# Patient Record
Sex: Female | Born: 1975 | Race: Black or African American | Hispanic: No | Marital: Single | Smoking: Current every day smoker
Health system: Southern US, Community
[De-identification: ages and names within clinical notes are randomized; demographics above are authoritative.]

## PROBLEM LIST (undated history)

## (undated) ENCOUNTER — Ambulatory Visit (HOSPITAL_COMMUNITY): Admission: EM | Discharge status: Absent without leave | Payer: Medicaid Other

## (undated) DIAGNOSIS — F4310 Post-traumatic stress disorder, unspecified: Secondary | ICD-10-CM

## (undated) DIAGNOSIS — Z716 Tobacco abuse counseling: Secondary | ICD-10-CM

## (undated) DIAGNOSIS — D649 Anemia, unspecified: Secondary | ICD-10-CM

## (undated) DIAGNOSIS — I739 Peripheral vascular disease, unspecified: Secondary | ICD-10-CM

## (undated) DIAGNOSIS — F1010 Alcohol abuse, uncomplicated: Secondary | ICD-10-CM

## (undated) DIAGNOSIS — C801 Malignant (primary) neoplasm, unspecified: Secondary | ICD-10-CM

## (undated) DIAGNOSIS — F32A Depression, unspecified: Secondary | ICD-10-CM

## (undated) DIAGNOSIS — I2510 Atherosclerotic heart disease of native coronary artery without angina pectoris: Secondary | ICD-10-CM

## (undated) DIAGNOSIS — K859 Acute pancreatitis without necrosis or infection, unspecified: Secondary | ICD-10-CM

## (undated) DIAGNOSIS — I700 Atherosclerosis of aorta: Secondary | ICD-10-CM

## (undated) DIAGNOSIS — F419 Anxiety disorder, unspecified: Secondary | ICD-10-CM

## (undated) DIAGNOSIS — F319 Bipolar disorder, unspecified: Secondary | ICD-10-CM

## (undated) DIAGNOSIS — K7460 Unspecified cirrhosis of liver: Secondary | ICD-10-CM

## (undated) DIAGNOSIS — K219 Gastro-esophageal reflux disease without esophagitis: Secondary | ICD-10-CM

## (undated) DIAGNOSIS — I999 Unspecified disorder of circulatory system: Secondary | ICD-10-CM

## (undated) HISTORY — PX: COLONOSCOPY: SHX5424

## (undated) HISTORY — DX: Acute pancreatitis without necrosis or infection, unspecified: K85.90

## (undated) HISTORY — DX: Atherosclerosis of aorta: I70.0

## (undated) HISTORY — DX: Anxiety disorder, unspecified: F41.9

## (undated) HISTORY — DX: Tobacco abuse counseling: Z71.6

## (undated) HISTORY — DX: Unspecified cirrhosis of liver: K74.60

## (undated) HISTORY — PX: OTHER SURGICAL HISTORY: SHX169

## (undated) HISTORY — PX: FOOT SURGERY: SHX648

## (undated) HISTORY — PX: TUBAL LIGATION: SHX77

## (undated) HISTORY — DX: Gastro-esophageal reflux disease without esophagitis: K21.9

---

## 1998-08-20 ENCOUNTER — Emergency Department (HOSPITAL_COMMUNITY): Admission: EM | Admit: 1998-08-20 | Discharge: 1998-08-20 | Payer: Self-pay | Admitting: Emergency Medicine

## 1998-11-08 ENCOUNTER — Emergency Department (HOSPITAL_COMMUNITY): Admission: EM | Admit: 1998-11-08 | Discharge: 1998-11-08 | Payer: Self-pay | Admitting: Emergency Medicine

## 1999-06-24 ENCOUNTER — Emergency Department (HOSPITAL_COMMUNITY): Admission: EM | Admit: 1999-06-24 | Discharge: 1999-06-24 | Payer: Self-pay | Admitting: Emergency Medicine

## 2000-03-29 ENCOUNTER — Emergency Department (HOSPITAL_COMMUNITY): Admission: EM | Admit: 2000-03-29 | Discharge: 2000-03-29 | Payer: Self-pay | Admitting: Emergency Medicine

## 2000-06-04 ENCOUNTER — Emergency Department (HOSPITAL_COMMUNITY): Admission: EM | Admit: 2000-06-04 | Discharge: 2000-06-04 | Payer: Self-pay | Admitting: Emergency Medicine

## 2000-06-04 ENCOUNTER — Encounter: Payer: Self-pay | Admitting: Emergency Medicine

## 2001-02-05 ENCOUNTER — Emergency Department (HOSPITAL_COMMUNITY): Admission: EM | Admit: 2001-02-05 | Discharge: 2001-02-05 | Payer: Self-pay | Admitting: Emergency Medicine

## 2001-06-07 ENCOUNTER — Emergency Department (HOSPITAL_COMMUNITY): Admission: EM | Admit: 2001-06-07 | Discharge: 2001-06-07 | Payer: Self-pay | Admitting: Emergency Medicine

## 2001-12-10 ENCOUNTER — Emergency Department (HOSPITAL_COMMUNITY): Admission: EM | Admit: 2001-12-10 | Discharge: 2001-12-10 | Payer: Self-pay | Admitting: Emergency Medicine

## 2001-12-10 ENCOUNTER — Encounter: Payer: Self-pay | Admitting: Emergency Medicine

## 2002-04-19 ENCOUNTER — Inpatient Hospital Stay (HOSPITAL_COMMUNITY): Admission: AD | Admit: 2002-04-19 | Discharge: 2002-04-19 | Payer: Self-pay | Admitting: *Deleted

## 2002-07-23 ENCOUNTER — Inpatient Hospital Stay (HOSPITAL_COMMUNITY): Admission: AD | Admit: 2002-07-23 | Discharge: 2002-07-23 | Payer: Self-pay | Admitting: *Deleted

## 2002-08-13 HISTORY — PX: ABDOMINAL HYSTERECTOMY: SHX81

## 2002-08-20 ENCOUNTER — Observation Stay (HOSPITAL_COMMUNITY): Admission: AD | Admit: 2002-08-20 | Discharge: 2002-08-20 | Payer: Self-pay | Admitting: Obstetrics and Gynecology

## 2002-08-25 ENCOUNTER — Inpatient Hospital Stay (HOSPITAL_COMMUNITY): Admission: AD | Admit: 2002-08-25 | Discharge: 2002-08-27 | Payer: Self-pay | Admitting: *Deleted

## 2002-08-26 ENCOUNTER — Encounter (INDEPENDENT_AMBULATORY_CARE_PROVIDER_SITE_OTHER): Payer: Self-pay | Admitting: Specialist

## 2002-11-17 ENCOUNTER — Emergency Department (HOSPITAL_COMMUNITY): Admission: EM | Admit: 2002-11-17 | Discharge: 2002-11-17 | Payer: Self-pay | Admitting: Emergency Medicine

## 2003-05-24 ENCOUNTER — Emergency Department (HOSPITAL_COMMUNITY): Admission: EM | Admit: 2003-05-24 | Discharge: 2003-05-25 | Payer: Self-pay | Admitting: Emergency Medicine

## 2003-11-25 ENCOUNTER — Emergency Department (HOSPITAL_COMMUNITY): Admission: EM | Admit: 2003-11-25 | Discharge: 2003-11-25 | Payer: Self-pay | Admitting: Emergency Medicine

## 2004-10-08 ENCOUNTER — Emergency Department (HOSPITAL_COMMUNITY): Admission: EM | Admit: 2004-10-08 | Discharge: 2004-10-08 | Payer: Self-pay | Admitting: Emergency Medicine

## 2006-04-02 ENCOUNTER — Inpatient Hospital Stay (HOSPITAL_COMMUNITY): Admission: AD | Admit: 2006-04-02 | Discharge: 2006-04-02 | Payer: Self-pay | Admitting: Obstetrics and Gynecology

## 2007-07-11 ENCOUNTER — Emergency Department (HOSPITAL_COMMUNITY): Admission: EM | Admit: 2007-07-11 | Discharge: 2007-07-11 | Payer: Self-pay | Admitting: Emergency Medicine

## 2008-04-16 ENCOUNTER — Inpatient Hospital Stay (HOSPITAL_COMMUNITY): Admission: AD | Admit: 2008-04-16 | Discharge: 2008-04-16 | Payer: Self-pay | Admitting: Obstetrics & Gynecology

## 2008-06-02 ENCOUNTER — Ambulatory Visit (HOSPITAL_COMMUNITY): Admission: RE | Admit: 2008-06-02 | Discharge: 2008-06-02 | Payer: Self-pay | Admitting: Obstetrics & Gynecology

## 2008-12-09 ENCOUNTER — Inpatient Hospital Stay (HOSPITAL_COMMUNITY): Admission: AD | Admit: 2008-12-09 | Discharge: 2008-12-10 | Payer: Self-pay | Admitting: Obstetrics & Gynecology

## 2008-12-29 ENCOUNTER — Ambulatory Visit: Payer: Self-pay | Admitting: Obstetrics and Gynecology

## 2008-12-29 ENCOUNTER — Encounter: Payer: Self-pay | Admitting: Family

## 2008-12-30 ENCOUNTER — Encounter: Payer: Self-pay | Admitting: Family

## 2008-12-30 LAB — CONVERTED CEMR LAB: CA 125: 8.6 units/mL (ref 0.0–30.2)

## 2009-01-06 ENCOUNTER — Ambulatory Visit: Payer: Self-pay | Admitting: Obstetrics & Gynecology

## 2009-02-21 ENCOUNTER — Ambulatory Visit: Payer: Self-pay | Admitting: Obstetrics & Gynecology

## 2009-02-21 ENCOUNTER — Encounter: Payer: Self-pay | Admitting: Obstetrics & Gynecology

## 2009-02-21 ENCOUNTER — Ambulatory Visit (HOSPITAL_COMMUNITY): Admission: RE | Admit: 2009-02-21 | Discharge: 2009-02-21 | Payer: Self-pay | Admitting: Obstetrics & Gynecology

## 2010-04-05 ENCOUNTER — Emergency Department (HOSPITAL_COMMUNITY): Admission: EM | Admit: 2010-04-05 | Discharge: 2010-04-05 | Payer: Self-pay | Admitting: Emergency Medicine

## 2010-09-03 ENCOUNTER — Encounter: Payer: Self-pay | Admitting: *Deleted

## 2010-11-19 LAB — CBC
Hemoglobin: 14.3 g/dL (ref 12.0–15.0)
MCHC: 34.2 g/dL (ref 30.0–36.0)
MCV: 97.8 fL (ref 78.0–100.0)
RBC: 4.29 MIL/uL (ref 3.87–5.11)
WBC: 6.7 10*3/uL (ref 4.0–10.5)

## 2010-11-22 LAB — WET PREP, GENITAL: Yeast Wet Prep HPF POC: NONE SEEN

## 2010-11-22 LAB — CBC
HCT: 37.1 % (ref 36.0–46.0)
MCHC: 34.5 g/dL (ref 30.0–36.0)
MCV: 98.9 fL (ref 78.0–100.0)
RBC: 3.75 MIL/uL — ABNORMAL LOW (ref 3.87–5.11)

## 2010-11-22 LAB — GC/CHLAMYDIA PROBE AMP, GENITAL: GC Probe Amp, Genital: NEGATIVE

## 2010-12-26 NOTE — Group Therapy Note (Signed)
NAME:  Felicia Perkins, Felicia Perkins NO.:  [id number]   MEDICAL RECORD NO.:  [id number]          PATIENT TYPE:  WOC   LOCATION:  [facility]                   FACILITY:  [facility]   PHYSICIAN:  Johnella Moloney, MD        DATE OF BIRTH:  1975-09-16   DATE OF SERVICE:  12/29/2008                                  CLINIC NOTE   CHIEF COMPLAINT:  Followup for ovarian cyst diagnosed in the [facility]missions [facility] on December 09, 2008.   ALLERGIES:  No known drug allergies.   CURRENT MEDICATIONS:  None.   MENSTRUAL HISTORY:  Menarche 35 years old.  Reports regular cycles every  28 days, heavy to moderate flow.  Denies bleeding between periods.   OBSTETRICAL HISTORY:  Three normal vaginal deliveries.   CONTRACEPTIVE HISTORY:  Bilateral tubal ligation in 2001.   GYNECOLOGIC HISTORY:  Patient reports abnormal Pap smear 5 years ago,  unable to determine what type.  Reports having the mouth of the uterus  removed, received in [place].  Checked MSTAT for transcriptions and  none were available.   SURGICAL HISTORY:  Bilateral tubal ligation.   FAMILY HISTORY:  Mother with breast and cervical cancer, possible  ovarian, unsure.  Maternal breast cancer diagnosed at 68 years of age.  Sister with ovarian cancer diagnosed at 69 years of age.  Patient denies  family members doing BRCA testing.   PERSONAL MEDICAL HISTORY:  1. History of ulcer.  2. High blood pressure x1 year, no medication.  3. Dysplasia of the cervix.  4. Has positive hemorrhoids, occasional intermittent spotting of      blood.   SOCIAL HISTORY:  Three children.  Smokes approximately a half a pack x15  years.  No drinking.  No illicit drugs.  Denies physical or sexual abuse  or history of abuse.   REVIEW OF SYSTEMS:  The patient reports increased fatigue and weight  loss in the past 6 months as well as nausea and vomiting.  Pain with  intercourse x6 months.   EXAMINATION:  The patient is alert and oriented x3.  No signs  of acute  distress.  PELVIC EXAM:  Vagina:  No abnormal lesions.  No abnormal discharge.  Cervix is white, well demarcated area on the cervix totally encircling  the os.  No abnormal discharge from the cervix.  Uterus:  Small, mobile,  midline.  Adnexa:  Right tender with palpation.  No dominant masses.  Left nontender with palpation and slightly enlarged.   ASSESSMENT:  1. Ovarian cyst.  2. Family history of breast and cervical cancer.   PLAN:  1. CA-125 testing today.  2. Referred patient to the BRCA testing number:  [id number].      Discussed BRCA testing with patient and indication if positive.      Consulted with Dr. Silas Flood.  Have patient follow up next week for a      surgical consult.  Recommend removal of the ovaries.  Patient      agrees with plan.  3. A Pap smear obtained and sent to laboratory.  4. Follow up next week.  Sid Falcon, CNM    ______________________________  Johnella Moloney, MD    WM/MEDQ  D:  12/29/2008  T:  12/29/2008  Job:  [id number]

## 2010-12-26 NOTE — Op Note (Signed)
NAME:  Felicia Perkins, Felicia Perkins NO.:  [id number]   MEDICAL RECORD NO.:  [id number]          PATIENT TYPE:  AMB   LOCATION:  [facility]                           [facility]   PHYSICIAN:  Scheryl Darter, MD       DATE OF BIRTH:  [birth year]   DATE OF PROCEDURE:  02/21/2009  DATE OF DISCHARGE:  02/21/2009                               OPERATIVE REPORT   PROCEDURE:  Laparoscopy and bilateral salpingo-oophorectomy.   PREOPERATIVE DIAGNOSES:  1. Right cystic adnexal mass.  2. Family history of ovarian cancer.   POSTOPERATIVE DIAGNOSIS:  Bilateral benign ovarian cysts.   SURGEON:  Scheryl Darter, MD   ASSISTANT:  Javier Glazier. Rose, MD   ANESTHESIA:  General.   ESTIMATED BLOOD LOSS:  75 mL.   SPECIMENS:  Right and left fallopian tubes and ovaries.   COMPLICATIONS:  None.   DRAINS:  Foley catheter.   COUNTS:  Correct.   OPERATIVE COURSE:  The patient gave written consent for laparoscopy and  right salpingo-oophorectomy and possible left salpingo-oophorectomy.  Ultrasound showed a complex cystic right adnexal mass.  The patient has  a family history of ovarian cancer, and she requested removal of both  ovaries at the time of surgery.  The patient's identification was  confirmed.  She was brought to the operating room and general anesthesia  was induced.  She was placed in dorsal lithotomy position.  Exam  revealed no adnexal masses or tenderness.  Abdomen, perineum, and vagina  were sterilely prepped and draped.  Speculum was inserted, and Hulka  tenaculum was placed on the cervix.  A #11 blade was used to make a  transverse infraumbilical skin incision about 0.5 cm across.  Abdomen  was elevated, and Veress needle was placed, and CO2 was insufflated.  Trocar was placed, and a laparoscope was inserted with video camera in  use.  Panoramic view of the pelvis was obtained.  Uterus and tubes  looked normal.  Both ovaries were freely mobile and only appeared to  have functional  cysts.  Second punctures were placed in the right lower  quadrant and left lower quadrant with an 11-mm trocar placed in the  right lower quadrant.  The patient was placed in Trendelenburg position.  Right tube and ovary were elevated with grasper, and the EnSeal device  was used to coagulate and cut the right infundibulopelvic ligament after  the pelvis was inspected and the ureters were seen.  This was carried  along the broad ligament.  The utero-ovarian ligament and fallopian tube  were then likewise coagulated and transected and specimen was detached.  Likewise, the right tube and ovary were resected.  Hemostasis was  assured using the EnSeal device.  The specimens were removed using  EndoCatch pouch.  They were sent separately to Pathology.  The pelvis  was irrigated.  Good hemostasis was assured at the end of the case.  Estimated blood loss was 75 mL.  All instruments and CO2 was removed.  The fascia was closed with interrupted sutures of 0 Vicryl.  Skin was  closed with interrupted subcuticular sutures with  4-0 Vicryl.  Sterile  dressings were applied.  The patient tolerated the procedure well  without complications, and she was brought in stable condition to  recovery room.      Scheryl Darter, MD  Electronically Signed     JA/MEDQ  D:  02/21/2009  T:  02/22/2009  Job:  [id number]

## 2010-12-26 NOTE — Group Therapy Note (Signed)
NAME:  Felicia Perkins, Felicia Perkins NO.:  [id number]   MEDICAL RECORD NO.:  [id number]          PATIENT TYPE:  WOC   LOCATION:  [facility]                   [facility]   PHYSICIAN:  Scheryl Darter, MD       DATE OF BIRTH:  09/17/1975   DATE OF SERVICE:  01/06/2009                                  CLINIC NOTE   DICTATED BY:  Claudine Corbett.   The patient is a 35 year old African-American female with a family  history of ovarian cancer in her mother and sister as well as family  history of breast cancer in her mother who had this at close to age 15.  She presents for surgical consult for right ovarian complex cyst  diagnosed on her pelvic ultrasound dated December 10, 2008.  The cystic  lesion measures 3.5 cm and is oblong with internal septations.  She also  has a left 3.2 cm simple cyst present.  Overall the complex cystic  structure noted on the right ovary appears to be stable in appearance  compared to her previous pelvic ultrasound in October 2009.  The patient  does have associated abdominal cramping with her period. She also  complains of menorrhagia since September of 2009.  She denies any other  past surgical history other than bilateral tubal ligation in 2005.  She  has had 3 normal spontaneous vaginal deliveries.  Her last menstrual  period was December 03, 2008. She was also recently seen in clinic and had  a normal Pap smear on Dec 29, 2008.  She has not yet called the hotline  for BRCA testing.  For further past medical history please see dictated  note on Dec 29, 2008 by Dr. Silas Flood.   PHYSICAL EXAMINATION:  VITAL SIGNS:  Temperature 98.7, pulse 78, blood  pressure 136/96, weight 111.6 pounds.  Height is 5 feet 4 inches.  Respiratory rate 18.  GENERAL:  Thin, African-American female in no acute distress. Alert and  oriented.  LUNGS:  Clear to auscultation bilaterally.  CARDIOVASCULAR:  Regular rate and rhythm.  HEENT:  No thyromegaly.  NECK: Supple.  Mucous  membranes are moist.  No conjunctival pallor.  ABDOMEN: Soft and nontender, nondistended. No palpable masses.  EXTREMITIES:  No peripheral edema.  No clubbing or cyanosis.  PSYCHIATRIC: Normal mood.  Normal affect.   ASSESSMENT AND PLAN:  A 35 year old African-American female with family  history of ovarian cancer in her mother and sister as well as breast  cancer in her mother with a complex right ovarian cystic structure on  the right ovary measuring 2.5 cm.  We strongly recommended that the  patient go ahead and call the BRCA hotline to have this testing done as  this will further direct her management.  We will have her scheduled for  a laparoscopic assisted right salpingo-oophorectomy with possible left  salpingo-oophorectomy pending her BRCA status. The risks and  benefits of the surgery were discuss and reviewed with the patient.  The  patient did provide informed consent for the surgery.  We will contact  her with scheduling information.      Scheryl Darter, MD  ______________________________  Scheryl Darter, MD    JA/MEDQ  D:  01/06/2009  T:  01/06/2009  Job:  [id number]

## 2010-12-29 NOTE — Op Note (Signed)
   NAME:  Felicia Perkins, Felicia Perkins                       ACCOUNT NO.:  [id number]   MEDICAL RECORD NO.:  [id number]                   PATIENT TYPE:  INP   LOCATION:  9136                                 [facility]   PHYSICIAN:  Phil D. Okey Dupre, M.D.                  DATE OF BIRTH:  Dec 21, 1975   DATE OF PROCEDURE:  08/26/2002  DATE OF DISCHARGE:  08/27/2002                                 OPERATIVE REPORT   PREOPERATIVE DIAGNOSIS:  Voluntary sterilization.   POSTOPERATIVE DIAGNOSIS:  Voluntary sterilization.   PROCEDURE:  Bilateral tubal ligation, postpartum.   SURGEON:  Javier Glazier. Okey Dupre, M.D.   ANESTHESIA:  Epidural with IV sedation.   DESCRIPTION OF PROCEDURE:  Under satisfactory epidural with supplemental IV  sedation, the patient in the dorsal supine position, a Foley catheter in the  urinary bladder, the abdomen was prepped and draped in the usual sterile  manner and entered through a transverse subumbilical incision extending  situated less than a centimeter below the umbilicus and extending for a  total length of 4 cm.  The abdomen was opened on layers.  On entering the  peritoneal cavity, the right fallopian tube was grasped in the midportion  and followed down to the fimbriae to make sure that we were in the tube and  then in the midportion, the tie was placed on the distal and proximal end of  the tube by placing a suture through the meso beneath the tube in an  avascular area and tying around the distal and proximal end, forming a loop  of 2 cm above the tie.  A second tie was placed above the aforementioned  with 1 plain catgut suture, and the section of the tube above the ties  excised.  The ends of the tube held by the ties was coagulated with hot  cautery and the tube replaced into the peritoneal cavity.  The same  procedure was carried out with regard to the left fallopian tube.  The area  was observed for bleeding, none was noted, and the fascia was closed with  continuous  running 0 Vicryl on an atraumatic needle, which was continued  through a subcuticular closure.  A dry sterile dressing was applied.  The  patient tolerated the procedure well and was transferred to the recovery  room in satisfactory condition.                                                Phil D. Okey Dupre, M.D.    PDR/MEDQ  D:  10/07/2002  T:  10/08/2002  Job:  [id number]

## 2010-12-29 NOTE — Op Note (Signed)
   NAME:  Felicia Perkins, Felicia Perkins                       ACCOUNT NO.:  [id number]   MEDICAL RECORD NO.:  [id number]                   PATIENT TYPE:  INP   LOCATION:  9136                                 [facility]   PHYSICIAN:  Phil D. Okey Dupre, M.D.                  DATE OF BIRTH:  09-09-1975   DATE OF PROCEDURE:  08/26/2002  DATE OF DISCHARGE:                                 OPERATIVE REPORT   PROCEDURE:  Bilateral tubal ligation.   PREOPERATIVE DIAGNOSES:  1. Multiparity.  2. Voluntary sterilization.   POSTOPERATIVE DIAGNOSES:  1. Multiparity.  2. Voluntary sterilization.   SURGEON:  Javier Glazier. Okey Dupre, M.D.   SURGICAL REMOVAL:  2 cm of each fallopian tube.   OPERATIVE FINDINGS:  Two normal fallopian tubes.   PROCEDURE:  Under satisfactory epidural anesthesia with IV sedation the  patient in a dorsal supine position and the abdomen prepped and draped in  the usual sterile manner.  Entered through a 5 cm transverse incision  situated just below the umbilicus about 1 cm.  The abdomen was opened by  layers.  On entering the peritoneal cavity the right fallopian tube was  grasped in the mid portion and followed down to the fimbria.  Opening made  in an avascular portion of the meso beneath the tube and one plain suture  drawn through this and tied around the distal and proximal end of the tube  to form a loop of approximately 2 cm above the tie.  This tie was clamped  with a hemostat, the second tie placed just below the first, a free tie of 1  plain.  The section of tube above the ties was then excised and the tips of  the tubes coagulated with hot cautery.  The area was observed for bleeding.  None was noted.  The tube was placed back into the peritoneal cavity.  The  same was carried out with regard to the left fallopian tube and the  peritoneum and fascia were included in the same stitch and run with a 0  Vicryl suture which was continued to a subcuticular closure.  Dry, sterile  dressing was applied.  The patient was transferred to recovery room in  satisfactory condition after tolerating the procedure well.                                               Phil D. Okey Dupre, M.D.    PDR/MEDQ  D:  08/26/2002  T:  08/26/2002  Job:  [id number]

## 2011-05-16 LAB — URINE MICROSCOPIC-ADD ON

## 2011-05-16 LAB — URINALYSIS, ROUTINE W REFLEX MICROSCOPIC
Leukocytes, UA: NEGATIVE
Nitrite: NEGATIVE
Protein, ur: 30 — AB
Specific Gravity, Urine: >1.03 — ABNORMAL HIGH
Urobilinogen, UA: 0.2

## 2011-05-16 LAB — WET PREP, GENITAL: Clue Cells Wet Prep HPF POC: NONE SEEN

## 2011-05-16 LAB — CBC
HCT: 39.3
Hemoglobin: 13.4
RBC: 3.71 — ABNORMAL LOW
WBC: 4.7

## 2011-08-14 ENCOUNTER — Encounter: Payer: Self-pay | Admitting: *Deleted

## 2011-08-14 ENCOUNTER — Emergency Department (HOSPITAL_COMMUNITY)
Admission: EM | Admit: 2011-08-14 | Discharge: 2011-08-14 | Disposition: A | Discharge status: Home / Self Care | Payer: Medicaid Other | Attending: Emergency Medicine | Admitting: Emergency Medicine

## 2011-08-14 ENCOUNTER — Emergency Department (HOSPITAL_COMMUNITY): Payer: Medicaid Other

## 2011-08-14 DIAGNOSIS — R1084 Generalized abdominal pain: Secondary | ICD-10-CM | POA: Insufficient documentation

## 2011-08-14 DIAGNOSIS — K644 Residual hemorrhoidal skin tags: Secondary | ICD-10-CM | POA: Insufficient documentation

## 2011-08-14 DIAGNOSIS — R7989 Other specified abnormal findings of blood chemistry: Secondary | ICD-10-CM | POA: Insufficient documentation

## 2011-08-14 DIAGNOSIS — K921 Melena: Secondary | ICD-10-CM | POA: Insufficient documentation

## 2011-08-14 DIAGNOSIS — K625 Hemorrhage of anus and rectum: Secondary | ICD-10-CM | POA: Insufficient documentation

## 2011-08-14 DIAGNOSIS — R110 Nausea: Secondary | ICD-10-CM | POA: Insufficient documentation

## 2011-08-14 DIAGNOSIS — R634 Abnormal weight loss: Secondary | ICD-10-CM | POA: Insufficient documentation

## 2011-08-14 DIAGNOSIS — R630 Anorexia: Secondary | ICD-10-CM | POA: Insufficient documentation

## 2011-08-14 LAB — DIFFERENTIAL
Basophils Absolute: 0.1 10*3/uL (ref 0.0–0.1)
Basophils Relative: 1 % (ref 0–1)
Monocytes Absolute: 0.5 10*3/uL (ref 0.1–1.0)
Neutro Abs: 2 10*3/uL (ref 1.7–7.7)

## 2011-08-14 LAB — POCT I-STAT, CHEM 8
Calcium, Ion: 1.19 mmol/L (ref 1.12–1.32)
Chloride: 105 mEq/L (ref 96–112)
HCT: 44 % (ref 36.0–46.0)
Hemoglobin: 15 g/dL (ref 12.0–15.0)
TCO2: 23 mmol/L (ref 0–100)

## 2011-08-14 LAB — URINALYSIS, ROUTINE W REFLEX MICROSCOPIC
Bilirubin Urine: NEGATIVE
Hgb urine dipstick: NEGATIVE
Specific Gravity, Urine: 1.016 (ref 1.005–1.030)
Urobilinogen, UA: 0.2 mg/dL (ref 0.0–1.0)
pH: 5 (ref 5.0–8.0)

## 2011-08-14 LAB — HEPATIC FUNCTION PANEL
ALT: 54 U/L — ABNORMAL HIGH (ref 0–35)
Indirect Bilirubin: 0.5 mg/dL (ref 0.3–0.9)
Total Protein: 7.9 g/dL (ref 6.0–8.3)

## 2011-08-14 LAB — CBC
Hemoglobin: 14.5 g/dL (ref 12.0–15.0)
MCH: 35.6 pg — ABNORMAL HIGH (ref 26.0–34.0)
MCV: 100 fL (ref 78.0–100.0)
Platelets: 222 10*3/uL (ref 150–400)
RBC: 4.07 MIL/uL (ref 3.87–5.11)
WBC: 6 10*3/uL (ref 4.0–10.5)

## 2011-08-14 MED ORDER — MORPHINE SULFATE 4 MG/ML IJ SOLN
4.0000 mg | Freq: Once | INTRAMUSCULAR | Status: AC
  Administered 2011-08-14: 4 mg via INTRAVENOUS
  Filled 2011-08-14: qty 1

## 2011-08-14 MED ORDER — SODIUM CHLORIDE 0.9 % IV SOLN
Freq: Once | INTRAVENOUS | Status: AC
  Administered 2011-08-14 (×2): via INTRAVENOUS

## 2011-08-14 MED ORDER — ONDANSETRON HCL 4 MG/2ML IJ SOLN
4.0000 mg | Freq: Once | INTRAMUSCULAR | Status: AC
  Administered 2011-08-14: 4 mg via INTRAVENOUS
  Filled 2011-08-14: qty 2

## 2011-08-14 NOTE — ED Notes (Signed)
Pt is here with pain below belly button for one month.  Pt sts rectal bleeding that started 2 months ago and now it has increased.  Pt reports severe stomach cramps and increased bowel movements per day.  Pt denies diarrhea

## 2011-08-14 NOTE — ED Provider Notes (Signed)
History     CSN: [id number]  Arrival date & time 08/14/11  0909   First MD Initiated Contact with Patient 08/14/11 [phone]      Chief Complaint  Patient presents with  . Abdominal Pain    (Consider location/radiation/quality/duration/timing/severity/associated sxs/prior treatment) Patient is a 36 y.o. female presenting with abdominal pain. The history is provided by the patient.  Abdominal Pain The primary symptoms of the illness include abdominal pain and nausea. The primary symptoms of the illness do not include fever, shortness of breath, vomiting, diarrhea, dysuria, vaginal discharge or vaginal bleeding.  Symptoms associated with the illness do not include chills or constipation.   patient presents with several months worth of abdominal pain, rectal bleeding, and decreased appetite. The pain seems to be generalized throughout the abdomen and is described as crampy in nature. There are no known aggravating or alleviating factors. She is taking an over-the-counter ibuprofen and Tylenol which have not helped. She has had associated nausea.  She also states that she has had increased bowel movements per day noting up to 6 per day at times. She is also noted bright red blood in her stool. States that she initially just noticed blood spotting but the toilet paper, but now notices blood coating the stool and blood in the toilet bowl. She states that she had hemorrhoids with her pregnancy, but has not had any trouble consents. She did not have to go to any procedures to get these repaired. She denies hard stool or diarrhea.  Finally, she has noticed a loss of appetite over the past 2 months or so. She states that she sometimes has to force herself to eat. She has lost about 10 pounds over this time. She does have a family history of colon cancer, but is unsure when her family members were diagnosed. She's never been seen by GI or had a colonoscopy. She has no known history of Crohn's or ulcerative  colitis.  States she has been using Tylenol frequently recently, and admits to frequent use of Goody's powders prior to using Tylenol.   History reviewed. No pertinent past medical history.  Past Surgical History  Procedure Date  . Other surgical history     ovarian surgery    No family history on file.  History  Substance Use Topics  . Smoking status: Current Everyday Smoker  . Smokeless tobacco: Not on file  . Alcohol Use: Yes     occ    OB History    Grav Para Term Preterm Abortions TAB SAB Ect Mult Living                  Review of Systems  Constitutional: Positive for unexpected weight change. Negative for fever, chills, activity change and appetite change.  HENT: Negative.   Respiratory: Negative for chest tightness and shortness of breath.   Cardiovascular: Negative for chest pain and palpitations.  Gastrointestinal: Positive for nausea, abdominal pain, blood in stool and anal bleeding. Negative for vomiting, diarrhea, constipation, abdominal distention and rectal pain.  Genitourinary: Negative for dysuria, vaginal bleeding and vaginal discharge.  Musculoskeletal: Negative.   Skin: Negative.   Neurological: Negative for dizziness and weakness.    Allergies  Review of patient's allergies indicates no known allergies.  Home Medications   Current Outpatient Rx  Name Route Sig Dispense Refill  . CITALOPRAM HYDROBROMIDE 20 MG PO TABS Oral Take 20 mg by mouth daily.      . TRAZODONE HCL 50 MG PO TABS Oral  Take 100 mg by mouth at bedtime.        BP 126/92  Pulse 99  Temp(Src) 98.6 F (37 C) (Oral)  Resp 18  SpO2 99%  Physical Exam  Nursing note and vitals reviewed. Constitutional: She appears well-developed and well-nourished.  Non-toxic appearance. No distress.  HENT:  Head: Normocephalic and atraumatic.  Eyes: Conjunctivae are normal. Right eye exhibits no discharge. Left eye exhibits no discharge.  Neck: Normal range of motion.  Cardiovascular:  Normal rate, regular rhythm and normal heart sounds.   Pulmonary/Chest: Effort normal and breath sounds normal.  Abdominal: Soft. Normal appearance and bowel sounds are normal. She exhibits no distension, no ascites and no mass. There is no hepatosplenomegaly. There is tenderness in the right upper quadrant and epigastric area. There is guarding. There is no rebound and negative Murphy's sign.  Genitourinary: Rectal exam shows external hemorrhoid and anal tone abnormal. Rectal exam shows no internal hemorrhoid, no fissure, no mass and no tenderness. Guaiac positive stool.       2 ext hemorrhoids noted  Musculoskeletal: Normal range of motion. She exhibits no edema.  Neurological: She is alert.  Skin: Skin is warm and dry. She is not diaphoretic.  Psychiatric: She has a normal mood and affect.    ED Course  Procedures (including critical care time)  Labs Reviewed  URINALYSIS, ROUTINE W REFLEX MICROSCOPIC - Abnormal; Notable for the following:    APPearance HAZY (*)    All other components within normal limits  CBC - Abnormal; Notable for the following:    MCH 35.6 (*)    All other components within normal limits  DIFFERENTIAL - Abnormal; Notable for the following:    Neutrophils Relative 33 (*)    Lymphocytes Relative 54 (*)    All other components within normal limits  POCT I-STAT, CHEM 8 - Abnormal; Notable for the following:    BUN <3 (*)    All other components within normal limits  HEPATIC FUNCTION PANEL - Abnormal; Notable for the following:    AST 80 (*)    ALT 54 (*)    All other components within normal limits  LIPASE, BLOOD  I-STAT, CHEM 8  POCT OCCULT BLOOD STOOL, DEVICE  Hemoccult positive  US Abdomen Complete  08/14/2011  *RADIOLOGY REPORT*  Clinical Data:  Right upper quadrant pain and elevated liver function tests.  COMPLETE ABDOMINAL ULTRASOUND  Comparison:  None.  Findings:  Gallbladder:  Normal, without wall thickening, stone, or pericholecystic fluid.   Sonographic Murphy's sign was not elicited.  Common bile duct: Normal, 4 mm.  Liver: Normal in echogenicity, without focal lesion.  IVC: Negative  Pancreas:  Negative  Spleen:  Normal in size and echogenicity.  Right Kidney:  10.7 cm. No hydronephrosis.  Left Kidney:  10.2 cm. No hydronephrosis.  Abdominal aorta:  Nonaneurysmal without ascites.  IMPRESSION: Normal abdominal ultrasound.  No explanation for patient's symptoms.  Original Report Authenticated By: Consuello Bossier, M.D.     1. Elevated LFTs   2. Abdominal pain   3. Rectal bleeding       MDM  The patient's LFTs were elevated. An ultrasound of the abdomen was ordered, which was negative for gallbladder pathology. Patient admits that she has been taking Tylenol frequently lately, along with Goody's powder. Suspect this may be what is causing her symptoms. She was advised to discontinue the use of these substances. Recommended that she followup with [facility] to have repeat LFTs drawn and to establish PCP  f/u. She was noted to have external hemorrhoids on exam; this may be causing her rectal bleeding. Do not have suspicion for acute abdomen at this time based on presentation. Discussed return precautions. She verbalized understanding and agreed to plan.       Grant Fontana, [place] 08/14/11 [phone]

## 2011-08-14 NOTE — ED Notes (Signed)
Stroke log info;  Stroke encoded at 0955, called at 0959 by [facility], patient arrived 42. Edp exam at 1008, stroke team arrival 31, lsn 0715, pt arrival in ct 10:12, phlebotomy arrival 1005,   Ct read by neuro at 10:15

## 2011-08-14 NOTE — ED Notes (Signed)
Patient transported to Ultrasound 

## 2011-08-14 NOTE — ED Notes (Signed)
Pt reports chronic use of BC powders for pain

## 2011-08-14 NOTE — ED Notes (Signed)
Still waiting for ultrasound tech to come from [place] and do her [place].

## 2011-08-14 NOTE — ED Notes (Signed)
Pt stated that she was still having abdominal pain. Pt's bed adjusted for comfort and additional blankets given. Second bag of fluid changed and pain medications given. Ultrasound is still pending, tech is currently over at [facility] long and will be over here to complete pt's exam asap. Pt made aware of plan of care and what we are currently waiting on. Iv site clear for infiltrate. Pt resting comfortably.

## 2011-08-16 NOTE — ED Provider Notes (Signed)
History/physical exam/procedure(s) were performed by non-physician practitioner and as supervising physician I was immediately available for consultation/collaboration. I have reviewed all notes and am in agreement with care and plan.   Daryana Whirley S Jeriel Vivanco, MD 08/16/11 1241 

## 2011-09-02 ENCOUNTER — Emergency Department (HOSPITAL_COMMUNITY)
Admission: EM | Admit: 2011-09-02 | Discharge: 2011-09-02 | Disposition: A | Discharge status: Home / Self Care | Payer: Medicaid Other | Attending: Emergency Medicine | Admitting: Emergency Medicine

## 2011-09-02 ENCOUNTER — Encounter (HOSPITAL_COMMUNITY): Payer: Self-pay

## 2011-09-02 DIAGNOSIS — K089 Disorder of teeth and supporting structures, unspecified: Secondary | ICD-10-CM | POA: Insufficient documentation

## 2011-09-02 DIAGNOSIS — K0889 Other specified disorders of teeth and supporting structures: Secondary | ICD-10-CM

## 2011-09-02 DIAGNOSIS — K029 Dental caries, unspecified: Secondary | ICD-10-CM | POA: Insufficient documentation

## 2011-09-02 MED ORDER — OXYCODONE-ACETAMINOPHEN 5-325 MG PO TABS
2.0000 | ORAL_TABLET | Freq: Once | ORAL | Status: AC
  Administered 2011-09-02: 2 via ORAL
  Filled 2011-09-02: qty 2

## 2011-09-02 MED ORDER — OXYCODONE-ACETAMINOPHEN 5-325 MG PO TABS: 2.0000 | ORAL_TABLET | ORAL | Status: AC | PRN

## 2011-09-02 MED ORDER — PENICILLIN V POTASSIUM 250 MG PO TABS
500.0000 mg | ORAL_TABLET | Freq: Once | ORAL | Status: AC
  Administered 2011-09-02: 500 mg via ORAL
  Filled 2011-09-02: qty 2

## 2011-09-02 MED ORDER — PENICILLIN V POTASSIUM 500 MG PO TABS: 500.0000 mg | ORAL_TABLET | Freq: Four times a day (QID) | ORAL | Status: AC

## 2011-09-02 NOTE — ED Provider Notes (Signed)
History     CSN: [id number]  Arrival date & time 09/02/11  7673   First MD Initiated Contact with Patient 09/02/11 [phone]      Chief Complaint  Patient presents with  . Dental Pain    (Consider location/radiation/quality/duration/timing/severity/associated sxs/prior treatment) HPI Comments: R lower molar pain x 2 weeks. No difficulty breathing or swallowing.  No fever, vomiting.  No drooling.  Poor dentition throughout.  Taking tylenol and orajel without relief.  Pain radiates down R neck.  Unable to see dentist because of medicaid problem.  The history is provided by the patient.    History reviewed. No pertinent past medical history.  Past Surgical History  Procedure Date  . Other surgical history     ovarian surgery    No family history on file.  History  Substance Use Topics  . Smoking status: Current Everyday Smoker  . Smokeless tobacco: Not on file  . Alcohol Use: No     occ    OB History    Grav Para Term Preterm Abortions TAB SAB Ect Mult Living                  Review of Systems  Constitutional: Negative for activity change and appetite change.  HENT: Positive for dental problem. Negative for drooling.   Respiratory: Negative for shortness of breath.   Cardiovascular: Negative for chest pain.  Gastrointestinal: Negative for nausea, vomiting and abdominal pain.  Genitourinary: Negative for dysuria.  Musculoskeletal: Negative for back pain.  Skin: Negative for rash.  Neurological: Negative for headaches.    Allergies  Review of patient's allergies indicates no known allergies.  Home Medications   Current Outpatient Rx  Name Route Sig Dispense Refill  . BENZOCAINE 20 % MT GEL Mouth/Throat Use as directed 1 application in the mouth or throat 4 (four) times daily as needed.    Marland Kitchen CITALOPRAM HYDROBROMIDE 20 MG PO TABS Oral Take 20 mg by mouth daily.      Marland Kitchen DIPHENHYDRAMINE-APAP (SLEEP) 25-500 MG PO TABS Oral Take 2 tablets by mouth at bedtime as needed.       BP 128/88  Pulse 84  Temp(Src) 97.6 F (36.4 C) (Oral)  Resp 16  SpO2 97%  Physical Exam  Constitutional: She is oriented to person, place, and time.  HENT:  Head: Normocephalic and atraumatic.  Mouth/Throat: Oropharynx is clear and moist. No oropharyngeal exudate.       R lower molar TTP to palpation with caries.  No abscess.  Multiple carious teeth.  Floor of mouth soft.  No trismus or tongue elevation  Eyes: Conjunctivae are normal. Pupils are equal, round, and reactive to light.  Neck: Normal range of motion. Neck supple.       No meningismus  Cardiovascular: Normal rate, regular rhythm and normal heart sounds.   Pulmonary/Chest: Effort normal and breath sounds normal. No respiratory distress.  Abdominal: Soft.  Musculoskeletal: Normal range of motion. She exhibits no edema and no tenderness.  Neurological: She is alert and oriented to person, place, and time. No cranial nerve deficit.  Skin: Skin is warm.    ED Course  Procedures (including critical care time)  Labs Reviewed - No data to display No results found.   No diagnosis found.    MDM  Dental pain.  No evidence of abscess.  No airway involvement, no systemic symptoms.  Pain meds, PCN, dental f/u        Glynn Octave, MD 09/02/11 [phone]

## 2011-09-02 NOTE — ED Notes (Signed)
MD at bedside. 

## 2011-09-02 NOTE — ED Notes (Signed)
Pt has left lower tooth pain x 2 weeks.  Was told by dentist that he can see her until her medicaid "comes through"

## 2012-02-24 ENCOUNTER — Encounter (HOSPITAL_COMMUNITY): Payer: Self-pay | Admitting: Emergency Medicine

## 2012-02-24 ENCOUNTER — Emergency Department (HOSPITAL_COMMUNITY)
Admission: EM | Admit: 2012-02-24 | Discharge: 2012-02-24 | Disposition: A | Discharge status: Home / Self Care | Payer: Medicaid Other | Attending: Emergency Medicine | Admitting: Emergency Medicine

## 2012-02-24 DIAGNOSIS — R42 Dizziness and giddiness: Secondary | ICD-10-CM | POA: Insufficient documentation

## 2012-02-24 DIAGNOSIS — R209 Unspecified disturbances of skin sensation: Secondary | ICD-10-CM | POA: Insufficient documentation

## 2012-02-24 HISTORY — DX: Post-traumatic stress disorder, unspecified: F43.10

## 2012-02-24 NOTE — ED Notes (Signed)
Pt to the desk, reports feeling so much better, was out in the heat and now in the cool air she feels normal, spouse coming to get her

## 2012-02-24 NOTE — ED Notes (Signed)
Pt. Stated, i was at Sunday dinner and I sit down for about 10 min and I got real dizzy and on the way home I got real shaky and feeling tingling and all I felt like doing was laying down.

## 2012-08-25 ENCOUNTER — Emergency Department (HOSPITAL_COMMUNITY): Payer: Medicaid Other

## 2012-08-25 ENCOUNTER — Encounter (HOSPITAL_COMMUNITY): Payer: Self-pay | Admitting: *Deleted

## 2012-08-25 ENCOUNTER — Emergency Department (HOSPITAL_COMMUNITY)
Admission: EM | Admit: 2012-08-25 | Discharge: 2012-08-25 | Disposition: A | Discharge status: Home / Self Care | Payer: Medicaid Other | Attending: Emergency Medicine | Admitting: Emergency Medicine

## 2012-08-25 DIAGNOSIS — H5710 Ocular pain, unspecified eye: Secondary | ICD-10-CM | POA: Insufficient documentation

## 2012-08-25 DIAGNOSIS — H53149 Visual discomfort, unspecified: Secondary | ICD-10-CM | POA: Insufficient documentation

## 2012-08-25 DIAGNOSIS — J3489 Other specified disorders of nose and nasal sinuses: Secondary | ICD-10-CM | POA: Insufficient documentation

## 2012-08-25 DIAGNOSIS — J329 Chronic sinusitis, unspecified: Secondary | ICD-10-CM | POA: Insufficient documentation

## 2012-08-25 DIAGNOSIS — Z79899 Other long term (current) drug therapy: Secondary | ICD-10-CM | POA: Insufficient documentation

## 2012-08-25 DIAGNOSIS — Z8659 Personal history of other mental and behavioral disorders: Secondary | ICD-10-CM | POA: Insufficient documentation

## 2012-08-25 DIAGNOSIS — F17200 Nicotine dependence, unspecified, uncomplicated: Secondary | ICD-10-CM | POA: Insufficient documentation

## 2012-08-25 LAB — CBC WITH DIFFERENTIAL/PLATELET
Eosinophils Absolute: 0.2 10*3/uL (ref 0.0–0.7)
Eosinophils Relative: 4 % (ref 0–5)
HCT: 36.5 % (ref 36.0–46.0)
Hemoglobin: 12.1 g/dL (ref 12.0–15.0)
Lymphocytes Relative: 36 % (ref 12–46)
Lymphs Abs: 1.4 10*3/uL (ref 0.7–4.0)
MCH: 33.3 pg (ref 26.0–34.0)
MCV: 100.6 fL — ABNORMAL HIGH (ref 78.0–100.0)
Monocytes Absolute: 0.4 10*3/uL (ref 0.1–1.0)
Monocytes Relative: 9 % (ref 3–12)
RBC: 3.63 MIL/uL — ABNORMAL LOW (ref 3.87–5.11)
WBC: 4 10*3/uL (ref 4.0–10.5)

## 2012-08-25 LAB — BASIC METABOLIC PANEL
BUN: 6 mg/dL (ref 6–23)
CO2: 22 mEq/L (ref 19–32)
Calcium: 8.9 mg/dL (ref 8.4–10.5)
GFR calc non Af Amer: >90 mL/min (ref >90)
Glucose, Bld: 97 mg/dL (ref 70–99)
Sodium: 140 mEq/L (ref 135–145)

## 2012-08-25 MED ORDER — SODIUM CHLORIDE 0.9 % IV BOLUS (SEPSIS)
1000.0000 mL | Freq: Once | INTRAVENOUS | Status: AC
  Administered 2012-08-25: 1000 mL via INTRAVENOUS

## 2012-08-25 MED ORDER — TETRACAINE HCL 0.5 % OP SOLN
2.0000 [drp] | Freq: Once | OPHTHALMIC | Status: AC
  Administered 2012-08-25: 2 [drp] via OPHTHALMIC
  Filled 2012-08-25: qty 2

## 2012-08-25 MED ORDER — AMOXICILLIN-POT CLAVULANATE 875-125 MG PO TABS: 1.0000 | ORAL_TABLET | Freq: Two times a day (BID) | ORAL | Status: DC

## 2012-08-25 MED ORDER — METOCLOPRAMIDE HCL 5 MG/ML IJ SOLN
10.0000 mg | Freq: Once | INTRAMUSCULAR | Status: AC
  Administered 2012-08-25: 10 mg via INTRAVENOUS
  Filled 2012-08-25: qty 2

## 2012-08-25 MED ORDER — DIPHENHYDRAMINE HCL 50 MG/ML IJ SOLN
25.0000 mg | Freq: Once | INTRAMUSCULAR | Status: AC
  Administered 2012-08-25: 25 mg via INTRAVENOUS
  Filled 2012-08-25: qty 1

## 2012-08-25 MED ORDER — KETOROLAC TROMETHAMINE 30 MG/ML IJ SOLN
30.0000 mg | Freq: Once | INTRAMUSCULAR | Status: AC
  Administered 2012-08-25: 30 mg via INTRAVENOUS
  Filled 2012-08-25: qty 1

## 2012-08-25 NOTE — ED Notes (Signed)
Pt states sinus congestion for 2 days and not with right facial pain and throbbing behind right eye and change in vision.  Woke up this way.

## 2012-08-25 NOTE — ED Provider Notes (Signed)
History     CSN: [id number]  Arrival date & time 08/25/12  0801   None     Chief Complaint  Patient presents with  . Headache    (Consider location/radiation/quality/duration/timing/severity/associated sxs/prior treatment) HPI Comments: 37 yo female presents with gradual onset headache for the past two days. The pain has increased from 2/10 to 10/10 today. Pt tried some over the counter medicine with no relief. Pt describes the pain as constant, throbbing, and localized over and around the right orbital area. Pt states she has never had this type of headache before. Pt admits tenderness to the area, nausea, photophobia  Pt denies trauma, history of migraines, fever, chills, scalp tenderness, jaw pain, trouble chewing/speaking, numbness/tingling.  Patient is a 37 y.o. female presenting with headaches.  Headache  Pertinent negatives include no fever, no palpitations, no shortness of breath, no nausea and no vomiting.    Past Medical History  Diagnosis Date  . PTSD (post-traumatic stress disorder)     Past Surgical History  Procedure Date  . Other surgical history     ovarian surgery    No family history on file.  History  Substance Use Topics  . Smoking status: Current Every Day Smoker  . Smokeless tobacco: Not on file  . Alcohol Use: No     Comment: occ    OB History    Grav Para Term Preterm Abortions TAB SAB Ect Mult Living                  Review of Systems  Constitutional: Negative for fever and diaphoresis.  HENT: Positive for congestion, rhinorrhea and sinus pressure. Negative for hearing loss, nosebleeds, facial swelling, trouble swallowing, neck pain, neck stiffness and tinnitus.        No painful chewing, no tenderness of scalp  Eyes: Positive for photophobia and pain. Negative for visual disturbance.  Respiratory: Negative for apnea, cough, chest tightness and shortness of breath.   Cardiovascular: Negative for chest pain, palpitations and leg  swelling.  Gastrointestinal: Negative for nausea, vomiting, abdominal pain, diarrhea, constipation and abdominal distention.  Genitourinary: Negative for dysuria.  Musculoskeletal: Negative for gait problem.  Skin: Negative for rash.  Neurological: Positive for headaches. Negative for dizziness, weakness, light-headedness and numbness.  All other systems reviewed and are negative.    Allergies  Review of patient's allergies indicates no known allergies.  Home Medications   Current Outpatient Rx  Name  Route  Sig  Dispense  Refill  . CITALOPRAM HYDROBROMIDE 20 MG PO TABS   Oral   Take 20 mg by mouth daily.           Marland Kitchen DIPHENHYDRAMINE-APAP (SLEEP) 25-500 MG PO TABS   Oral   Take 2 tablets by mouth at bedtime as needed.           BP 145/95  Pulse 71  Temp 98.1 F (36.7 C) (Oral)  Resp 18  SpO2 98%  Physical Exam  Nursing note and vitals reviewed. Constitutional: She is oriented to person, place, and time. She appears well-developed and well-nourished.       Pt looked uncomfortable.  HENT:  Head: Normocephalic and atraumatic.  Right Ear: Hearing and tympanic membrane normal.  Left Ear: Hearing and tympanic membrane normal.  Nose: Right sinus exhibits maxillary sinus tenderness and frontal sinus tenderness. Left sinus exhibits no maxillary sinus tenderness and no frontal sinus tenderness.  Eyes: Conjunctivae normal and EOM are normal. Pupils are equal, round, and reactive to light. Right  eye exhibits no discharge. Left eye exhibits no discharge.  Neck: Normal range of motion. Neck supple.       No meningeal signs  Cardiovascular: Normal rate, regular rhythm and normal heart sounds.  Exam reveals no gallop and no friction rub.   No murmur heard. Pulmonary/Chest: Effort normal and breath sounds normal. No respiratory distress. She has no wheezes. She has no rales.  Abdominal: Soft. Bowel sounds are normal. She exhibits no distension. There is no tenderness. There is no  rebound and no guarding.  Musculoskeletal: Normal range of motion. She exhibits no edema and no tenderness.  Neurological: She is alert and oriented to person, place, and time.  Skin: Skin is warm and dry. She is not diaphoretic.    ED Course  Procedures (including critical care time)  Labs Reviewed - No data to display Results for orders placed during the hospital encounter of 08/25/12  CBC WITH DIFFERENTIAL      Component Value Range   WBC 4.0  4.0 - 10.5 K/uL   RBC 3.63 (*) 3.87 - 5.11 MIL/uL   Hemoglobin 12.1  12.0 - 15.0 g/dL   HCT 16.1  09.6 - 04.5 %   MCV 100.6 (*) 78.0 - 100.0 fL   MCH 33.3  26.0 - 34.0 pg   MCHC 33.2  30.0 - 36.0 g/dL   RDW 40.9  81.1 - 91.4 %   Platelets 177  150 - 400 K/uL   Neutrophils Relative 48  43 - 77 %   Neutro Abs 1.9  1.7 - 7.7 K/uL   Lymphocytes Relative 36  12 - 46 %   Lymphs Abs 1.4  0.7 - 4.0 K/uL   Monocytes Relative 9  3 - 12 %   Monocytes Absolute 0.4  0.1 - 1.0 K/uL   Eosinophils Relative 4  0 - 5 %   Eosinophils Absolute 0.2  0.0 - 0.7 K/uL   Basophils Relative 2 (*) 0 - 1 %   Basophils Absolute 0.1  0.0 - 0.1 K/uL  BASIC METABOLIC PANEL      Component Value Range   Sodium 140  135 - 145 mEq/L   Potassium 4.0  3.5 - 5.1 mEq/L   Chloride 104  96 - 112 mEq/L   CO2 22  19 - 32 mEq/L   Glucose, Bld 97  70 - 99 mg/dL   BUN 6  6 - 23 mg/dL   Creatinine, Ser 7.82 (*) 0.50 - 1.10 mg/dL   Calcium 8.9  8.4 - 95.6 mg/dL   GFR calc non Af Amer >90  >90 mL/min   GFR calc Af Amer >90  >90 mL/min    Ct Head Wo Contrast  08/25/2012  *RADIOLOGY REPORT*  Clinical Data: Headache  CT HEAD WITHOUT CONTRAST  Technique:  Contiguous axial images were obtained from the base of the skull through the vertex without contrast.  Comparison: None.  Findings: Ventricle size is normal.  Mild atrophy for age.  No acute infarct mass or hemorrhage.  No fluid collection or midline shift.  Chronic sinusitis with moderate mucosal edema in the paranasal sinuses.   No acute bony abnormality.  IMPRESSION: Mild atrophy without acute intracranial abnormality.  Chronic sinusitis   Original Report Authenticated By: Janeece Riggers, M.D.      Diagnosis: Chronic sinusitis    MDM  No scalp tenderness, jaw pain, trouble chewing/speaking, leading away from giant cell arteritis. Visual disturbances not associated with acute angle closure glaucoma.  No focal deficits,  numbness, tingling. Managed pt pain with reglan, toradol, and benadryl in ER. Pt experienced some relief at first, but then pain returned. CT scan revealed chronic sinusitis. Discharged patient with Augmentin, resource list for PCPs, and instructions to seek medical intervention if symptoms persist or worsen.    Glade Nurse, PA-C 08/25/12 1601  Glade Nurse, PA-C 08/25/12 [phone]

## 2012-08-25 NOTE — ED Notes (Signed)
Pt reports her HA has gotten better but she is still experiencing some pain and is still having pressure behind her right eye. Pt in nad. No neuro deficits seen.

## 2012-08-25 NOTE — ED Provider Notes (Signed)
Medical screening examination/treatment/procedure(s) were conducted as a shared visit with non-physician practitioner(s) and myself.  I personally evaluated the patient during the encounter  Right sided headache, not improved with first round of meds, labs and CT as above unremarkable except for sinusitis.   Charles B. Bernette Mayers, MD 08/25/12 2139

## 2012-10-18 ENCOUNTER — Encounter (HOSPITAL_COMMUNITY): Payer: Self-pay

## 2012-10-18 ENCOUNTER — Emergency Department (HOSPITAL_COMMUNITY)
Admission: EM | Admit: 2012-10-18 | Discharge: 2012-10-18 | Disposition: A | Discharge status: Home / Self Care | Payer: Medicaid Other | Attending: Emergency Medicine | Admitting: Emergency Medicine

## 2012-10-18 DIAGNOSIS — Z79899 Other long term (current) drug therapy: Secondary | ICD-10-CM | POA: Insufficient documentation

## 2012-10-18 DIAGNOSIS — F17200 Nicotine dependence, unspecified, uncomplicated: Secondary | ICD-10-CM | POA: Insufficient documentation

## 2012-10-18 DIAGNOSIS — F4310 Post-traumatic stress disorder, unspecified: Secondary | ICD-10-CM | POA: Insufficient documentation

## 2012-10-18 DIAGNOSIS — F1010 Alcohol abuse, uncomplicated: Secondary | ICD-10-CM | POA: Insufficient documentation

## 2012-10-18 LAB — RAPID URINE DRUG SCREEN, HOSP PERFORMED
Benzodiazepines: NOT DETECTED
Cocaine: NOT DETECTED
Opiates: NOT DETECTED

## 2012-10-18 LAB — CBC
MCH: 36.3 pg — ABNORMAL HIGH (ref 26.0–34.0)
MCHC: 35.7 g/dL (ref 30.0–36.0)
MCV: 101.7 fL — ABNORMAL HIGH (ref 78.0–100.0)
Platelets: 232 10*3/uL (ref 150–400)
RBC: 4.21 MIL/uL (ref 3.87–5.11)

## 2012-10-18 LAB — COMPREHENSIVE METABOLIC PANEL
AST: 68 U/L — ABNORMAL HIGH (ref 0–37)
CO2: 26 mEq/L (ref 19–32)
Calcium: 9.8 mg/dL (ref 8.4–10.5)
Creatinine, Ser: 0.51 mg/dL (ref 0.50–1.10)
GFR calc Af Amer: >90 mL/min (ref >90)
GFR calc non Af Amer: >90 mL/min (ref >90)
Glucose, Bld: 86 mg/dL (ref 70–99)
Total Protein: 8.6 g/dL — ABNORMAL HIGH (ref 6.0–8.3)

## 2012-10-18 NOTE — ED Notes (Signed)
Pt given discharge paperwork; no additional questions by pt about d/c; pt verbalized understanding of f/u with detox programs; resps e/u;

## 2012-10-18 NOTE — ED Notes (Signed)
Pt is here today for detox from alcohol.  Pt states she has been drinking on and off since she was 37 years old. Pt reports last drink being this morning "I drank half of a 40 today".  Pt tearful and sad during assessment.  States she feels like she is in a prison d/t paper scrubs and being wanded.  When asked if she had any thoughts of harming herself, pt stated "I say that stuff as a way of venting.  I love myself.  I would never hurt myself.  I am just having a bad day."  Pt has attempted rehab in the past and states her drinking became excessive about 8 years ago when she started suffering from PTSD. Pt states she wants help and needs help.  Pt started becoming compliant as assessment progressed.  Pt has family member at bedside providing emotional support to pt.

## 2012-10-18 NOTE — BH Assessment (Signed)
[facility] Assessment Progress Note   This clinician was requested by PA Lorelle Formosa to talk with patient.  Full assessment was not requested as patient is soon to be discharged.  There was a concern about whether the patient was having suicidal thoughts per the friend that transported her to [facility].  Clinician talked to patient about her drinking.  She said that she was drinking at least a 40 oz beer daily.  She said that relationship problems (recent breakup w/ boyfriend of 14 yrs) and financial problems have been stressors for her.  Patient also smokes some marijuana but does not seek it out.  Patient has been to [facility] before for ETOH abuse issues.  Patient currently denies wanting to kill herself, she states, "I could not do that I love me."  Patient denies any prior attempts to kill self.  She also denies any HI or A/V hallucinations.  Patient has been given referral information for outpatient and inpatient detox providers.  Clinician also informed her about contacting [facility] if necessary.  Patient was invited to come back if she makes arrangements for child care and other things so that she can start and complete detox.

## 2012-10-18 NOTE — ED Notes (Signed)
Pt has decided to stay, has come in and got wrist band placed and is in line for triage. Is aware of poc.

## 2012-10-18 NOTE — ED Provider Notes (Signed)
Medical screening examination/treatment/procedure(s) were performed by non-physician practitioner and as supervising physician I was immediately available for consultation/collaboration.   Melanie Belfi, MD 10/18/12 2336 

## 2012-10-18 NOTE — ED Notes (Signed)
Pt expressing thoughts of harming self to [employer]; pt states she is hopeless and wants to end things; [facility] notified of need for sitter;

## 2012-10-18 NOTE — ED Provider Notes (Signed)
History    This chart was scribed for non-physician practitioner working with Rolan Bucco, MD by Gerlean Ren, ED Scribe. This patient was seen in room TR08C/TR08C and the patient's care was started at 7:21 PM.    CSN: [id number]  Arrival date & time 10/18/12  1519   First MD Initiated Contact with Patient 10/18/12 1827      Chief Complaint  Patient presents with  . Alcohol Problem     The history is provided by the patient. No language interpreter was used.  Felicia Perkins is a 37 y.o. female who presents to the Emergency Department for alcohol detox.  Upon arrival in pt's room pt states that she no longer wants to detox and that she wants to go home.  Pt reports that this morning pt had conversation with her friend and was interested in detox.  Pt reports she drinks at least 40oz malt liquor each day and that she frequently drinks more than that and has been drinking more recently due to life stressors.  Pt openly admits that she has a drinking problem but states it is not harmful.  Pt reports occasional marijuana use.  No IV drug use.  Pt denies snorting any drugs.  Pt denies suicidal ideations, desire to die, or homicidal ideations.  No prior plans to commit suicide.  Pt openly admits that she allows her emotions to build up and that alcohol is used to deal with these emotions.  Pt denies hallucinations.  No chronic medical conditions.  No regular medications.   Pt has no physical complaints at this time.   Past Medical History  Diagnosis Date  . PTSD (post-traumatic stress disorder)     Past Surgical History  Procedure Laterality Date  . Other surgical history      ovarian surgery  . Foot surgery    . Tubal ligation      No family history on file.  History  Substance Use Topics  . Smoking status: Current Every Day Smoker  . Smokeless tobacco: Not on file  . Alcohol Use: Yes     Comment: occ    No OB history provided.   Review of Systems  Constitutional:   Alcohol detox    Allergies  Review of patient's allergies indicates no known allergies.  Home Medications   Current Outpatient Rx  Name  Route  Sig  Dispense  Refill  . citalopram (CELEXA) 20 MG tablet   Oral   Take 20 mg by mouth daily.           . traZODone (DESYREL) 50 MG tablet   Oral   Take 50 mg by mouth at bedtime.           BP 160/98  Pulse 115  Temp(Src) 98.1 F (36.7 C) (Oral)  SpO2 98%  Physical Exam  Nursing note and vitals reviewed. Constitutional: She is oriented to person, place, and time. She appears well-developed and well-nourished. No distress.  HENT:  Head: Normocephalic and atraumatic.  Eyes: EOM are normal. Pupils are equal, round, and reactive to light.  Neck: Normal range of motion. Neck supple. No tracheal deviation present.  Cardiovascular: Normal rate, regular rhythm, normal heart sounds and intact distal pulses.  Exam reveals no gallop and no friction rub.   No murmur heard. Pulmonary/Chest: Effort normal and breath sounds normal. No respiratory distress. She has no wheezes. She has no rales.  Abdominal: Soft. Bowel sounds are normal. She exhibits no distension. There is no tenderness.  There is no rebound.  Musculoskeletal: Normal range of motion. She exhibits no edema and no tenderness.  Lymphadenopathy:    She has no cervical adenopathy.  Neurological: She is alert and oriented to person, place, and time. No cranial nerve deficit. Coordination normal.  Skin: Skin is warm and dry. No erythema.  Psychiatric: She has a normal mood and affect. Her behavior is normal. Judgment and thought content normal.    ED Course  Procedures (including critical care time) DIAGNOSTIC STUDIES: Oxygen Saturation is 98% on room air, normal by my interpretation.    COORDINATION OF CARE: 7:30 PM- Informed pt that she is not here IVC and is free to go if she wishes.  I offered resources to talk to Child psychotherapist and pt refuses.   Results for orders placed  during the hospital encounter of 10/18/12  CBC      Result Value Range   WBC 6.2  4.0 - 10.5 K/uL   RBC 4.21  3.87 - 5.11 MIL/uL   Hemoglobin 15.3 (*) 12.0 - 15.0 g/dL   HCT 40.9  81.1 - 91.4 %   MCV 101.7 (*) 78.0 - 100.0 fL   MCH 36.3 (*) 26.0 - 34.0 pg   MCHC 35.7  30.0 - 36.0 g/dL   RDW 78.2  95.6 - 21.3 %   Platelets 232  150 - 400 K/uL  COMPREHENSIVE METABOLIC PANEL      Result Value Range   Sodium 143  135 - 145 mEq/L   Potassium 3.9  3.5 - 5.1 mEq/L   Chloride 100  96 - 112 mEq/L   CO2 26  19 - 32 mEq/L   Glucose, Bld 86  70 - 99 mg/dL   BUN 7  6 - 23 mg/dL   Creatinine, Ser 0.86  0.50 - 1.10 mg/dL   Calcium 9.8  8.4 - 57.8 mg/dL   Total Protein 8.6 (*) 6.0 - 8.3 g/dL   Albumin 4.4  3.5 - 5.2 g/dL   AST 68 (*) 0 - 37 U/L   ALT 55 (*) 0 - 35 U/L   Alkaline Phosphatase 117  39 - 117 U/L   Total Bilirubin 0.2 (*) 0.3 - 1.2 mg/dL   GFR calc non Af Amer >90  >90 mL/min   GFR calc Af Amer >90  >90 mL/min  ETHANOL      Result Value Range   Alcohol, Ethyl (B) 280 (*) 0 - 11 mg/dL  URINE RAPID DRUG SCREEN (HOSP PERFORMED)      Result Value Range   Opiates NONE DETECTED  NONE DETECTED   Cocaine NONE DETECTED  NONE DETECTED   Benzodiazepines NONE DETECTED  NONE DETECTED   Amphetamines NONE DETECTED  NONE DETECTED   Tetrahydrocannabinol POSITIVE (*) NONE DETECTED   Barbiturates NONE DETECTED  NONE DETECTED    No results found.   1. Alcohol abuse       MDM  HENSLEE LOTTMAN presents for detox. Patient has a friend with her who stated to the nurses that the patient has made suicidal references and discusses that she is depressed and has nothing to live for. On my exam the patient is alert and oriented, nontoxic, nonseptic appearing. She adamantly denies any suicidal ideation or homicidal ideation. She is not hallucinating, her judgment and thought content appear normal. She is open and honest about her situation. She states she is seeing a Veterinary surgeon but has not been  honest with them about her drinking. She states she has  [facility] in the [facility] and that she will followup here. Patient evaluated by ACT team who agrees that the patient is not a danger to herself or others. We have given her resources for alcohol and drug treatment options as well as psychological help. The patient has remained calm and cooperative throughout time here I do not believe that an involuntary commitment is warranted at this time.  Patient has contracted for safety and promised to return here to the emergency department when she has her affairs in order for detox or if she becomes suicidal.  Patient's friend was unable to be reached for discussion of her statements.   I personally performed the services described in this documentation, which was scribed in my presence. The recorded information has been reviewed and is accurate.        Dahlia Client Muthersbaugh, PA-C 10/18/12 2130

## 2012-10-18 NOTE — ED Notes (Signed)
Unable to find pt in waiting room. 

## 2012-10-18 NOTE — ED Notes (Signed)
Family states pt is still in the car and does not want to come in and be seen, pt has not came to desk to get wristband. Will discharge pt out of system.

## 2012-10-18 NOTE — ED Notes (Signed)
I have been informed by [employer] that [place] [employer] [place].

## 2012-10-18 NOTE — ED Notes (Signed)
Patient presents with family for detox from ETOH.  Patient reports she drinks 80 ounces of beer daily.  Patient tearful in triage, denies suicidal and homicidal ideation.  Patient also reports she's been feeling depressed.

## 2012-12-13 ENCOUNTER — Encounter (HOSPITAL_COMMUNITY): Payer: Self-pay | Admitting: *Deleted

## 2012-12-13 ENCOUNTER — Emergency Department (HOSPITAL_COMMUNITY): Payer: Medicaid Other

## 2012-12-13 ENCOUNTER — Emergency Department (HOSPITAL_COMMUNITY)
Admission: EM | Admit: 2012-12-13 | Discharge: 2012-12-16 | Disposition: A | Discharge status: Other Acute Inpatient Hospital | Payer: Medicaid Other | Attending: Emergency Medicine | Admitting: Emergency Medicine

## 2012-12-13 DIAGNOSIS — F17200 Nicotine dependence, unspecified, uncomplicated: Secondary | ICD-10-CM | POA: Insufficient documentation

## 2012-12-13 DIAGNOSIS — R05 Cough: Secondary | ICD-10-CM | POA: Insufficient documentation

## 2012-12-13 DIAGNOSIS — Z8659 Personal history of other mental and behavioral disorders: Secondary | ICD-10-CM | POA: Insufficient documentation

## 2012-12-13 DIAGNOSIS — F3289 Other specified depressive episodes: Secondary | ICD-10-CM | POA: Insufficient documentation

## 2012-12-13 DIAGNOSIS — F1410 Cocaine abuse, uncomplicated: Secondary | ICD-10-CM | POA: Insufficient documentation

## 2012-12-13 DIAGNOSIS — K649 Unspecified hemorrhoids: Secondary | ICD-10-CM | POA: Insufficient documentation

## 2012-12-13 DIAGNOSIS — F1210 Cannabis abuse, uncomplicated: Secondary | ICD-10-CM | POA: Insufficient documentation

## 2012-12-13 DIAGNOSIS — F329 Major depressive disorder, single episode, unspecified: Secondary | ICD-10-CM | POA: Insufficient documentation

## 2012-12-13 DIAGNOSIS — F1010 Alcohol abuse, uncomplicated: Secondary | ICD-10-CM | POA: Insufficient documentation

## 2012-12-13 DIAGNOSIS — K921 Melena: Secondary | ICD-10-CM | POA: Insufficient documentation

## 2012-12-13 DIAGNOSIS — Z79899 Other long term (current) drug therapy: Secondary | ICD-10-CM | POA: Insufficient documentation

## 2012-12-13 DIAGNOSIS — Z3202 Encounter for pregnancy test, result negative: Secondary | ICD-10-CM | POA: Insufficient documentation

## 2012-12-13 DIAGNOSIS — R059 Cough, unspecified: Secondary | ICD-10-CM | POA: Insufficient documentation

## 2012-12-13 DIAGNOSIS — R1013 Epigastric pain: Secondary | ICD-10-CM | POA: Insufficient documentation

## 2012-12-13 LAB — COMPREHENSIVE METABOLIC PANEL
Albumin: 3.5 g/dL (ref 3.5–5.2)
Alkaline Phosphatase: 159 U/L — ABNORMAL HIGH (ref 39–117)
BUN: 3 mg/dL — ABNORMAL LOW (ref 6–23)
Calcium: 9.4 mg/dL (ref 8.4–10.5)
GFR calc Af Amer: >90 mL/min (ref >90)
Glucose, Bld: 95 mg/dL (ref 70–99)
Potassium: 4.2 mEq/L (ref 3.5–5.1)
Sodium: 136 mEq/L (ref 135–145)
Total Protein: 8.2 g/dL (ref 6.0–8.3)

## 2012-12-13 LAB — RAPID URINE DRUG SCREEN, HOSP PERFORMED
Amphetamines: NOT DETECTED
Benzodiazepines: NOT DETECTED
Opiates: NOT DETECTED
Tetrahydrocannabinol: POSITIVE — AB

## 2012-12-13 LAB — CBC WITH DIFFERENTIAL/PLATELET
Basophils Absolute: 0.1 10*3/uL (ref 0.0–0.1)
Eosinophils Absolute: 0.3 10*3/uL (ref 0.0–0.7)
Lymphocytes Relative: 42 % (ref 12–46)
MCH: 35.8 pg — ABNORMAL HIGH (ref 26.0–34.0)
MCHC: 36.5 g/dL — ABNORMAL HIGH (ref 30.0–36.0)
Monocytes Absolute: 0.7 10*3/uL (ref 0.1–1.0)
Neutro Abs: 2.5 10*3/uL (ref 1.7–7.7)
Neutrophils Relative %: 40 % — ABNORMAL LOW (ref 43–77)
RDW: 12.9 % (ref 11.5–15.5)

## 2012-12-13 LAB — URINALYSIS, ROUTINE W REFLEX MICROSCOPIC
Bilirubin Urine: NEGATIVE
Glucose, UA: NEGATIVE mg/dL
Hgb urine dipstick: NEGATIVE
Ketones, ur: NEGATIVE mg/dL
Protein, ur: NEGATIVE mg/dL
Urobilinogen, UA: 0.2 mg/dL (ref 0.0–1.0)

## 2012-12-13 LAB — LIPASE, BLOOD: Lipase: 65 U/L — ABNORMAL HIGH (ref 11–59)

## 2012-12-13 LAB — SALICYLATE LEVEL: Salicylate Lvl: <2 mg/dL — ABNORMAL LOW (ref 2.8–20.0)

## 2012-12-13 MED ORDER — ZOLPIDEM TARTRATE 5 MG PO TABS
5.0000 mg | ORAL_TABLET | Freq: Every evening | ORAL | Status: DC | PRN
  Administered 2012-12-14: 5 mg via ORAL
  Filled 2012-12-13: qty 1

## 2012-12-13 MED ORDER — VITAMIN B-1 100 MG PO TABS
100.0000 mg | ORAL_TABLET | Freq: Every day | ORAL | Status: DC
  Administered 2012-12-13 – 2012-12-15 (×3): 100 mg via ORAL
  Filled 2012-12-13 (×3): qty 1

## 2012-12-13 MED ORDER — LORAZEPAM 1 MG PO TABS
0.0000 mg | ORAL_TABLET | Freq: Four times a day (QID) | ORAL | Status: AC
  Administered 2012-12-13: 1 mg via ORAL
  Administered 2012-12-13 – 2012-12-14 (×2): 2 mg via ORAL
  Administered 2012-12-14: 1 mg via ORAL
  Administered 2012-12-14: 2 mg via ORAL
  Administered 2012-12-14 – 2012-12-15 (×3): 1 mg via ORAL
  Filled 2012-12-13: qty 2
  Filled 2012-12-13 (×4): qty 1
  Filled 2012-12-13: qty 2
  Filled 2012-12-13 (×2): qty 1

## 2012-12-13 MED ORDER — NICOTINE 21 MG/24HR TD PT24
21.0000 mg | MEDICATED_PATCH | Freq: Every day | TRANSDERMAL | Status: DC
  Administered 2012-12-13 – 2012-12-15 (×3): 21 mg via TRANSDERMAL
  Filled 2012-12-13 (×3): qty 1

## 2012-12-13 MED ORDER — LORAZEPAM 1 MG PO TABS
1.0000 mg | ORAL_TABLET | Freq: Four times a day (QID) | ORAL | Status: DC | PRN
  Administered 2012-12-15: 1 mg via ORAL
  Filled 2012-12-13 (×2): qty 1

## 2012-12-13 MED ORDER — LORAZEPAM 2 MG/ML IJ SOLN: 1.0000 mg | Freq: Four times a day (QID) | INTRAMUSCULAR | Status: DC | PRN

## 2012-12-13 MED ORDER — ADULT MULTIVITAMIN W/MINERALS CH
1.0000 | ORAL_TABLET | Freq: Every day | ORAL | Status: DC
  Administered 2012-12-13 – 2012-12-15 (×3): 1 via ORAL
  Filled 2012-12-13 (×3): qty 1

## 2012-12-13 MED ORDER — ONDANSETRON HCL 8 MG PO TABS
4.0000 mg | ORAL_TABLET | Freq: Three times a day (TID) | ORAL | Status: DC | PRN
  Administered 2012-12-14 (×2): 4 mg via ORAL
  Filled 2012-12-13: qty 1
  Filled 2012-12-13: qty 0.5
  Filled 2012-12-13: qty 1

## 2012-12-13 MED ORDER — THIAMINE HCL 100 MG/ML IJ SOLN: 100.0000 mg | Freq: Every day | INTRAMUSCULAR | Status: DC

## 2012-12-13 MED ORDER — ALUM & MAG HYDROXIDE-SIMETH 200-200-20 MG/5ML PO SUSP
30.0000 mL | ORAL | Status: DC | PRN
  Administered 2012-12-15: 30 mL via ORAL
  Filled 2012-12-13: qty 30

## 2012-12-13 MED ORDER — LORAZEPAM 1 MG PO TABS
0.0000 mg | ORAL_TABLET | Freq: Two times a day (BID) | ORAL | Status: DC
  Administered 2012-12-15: 1 mg via ORAL
  Filled 2012-12-13: qty 1

## 2012-12-13 MED ORDER — IBUPROFEN 200 MG PO TABS
600.0000 mg | ORAL_TABLET | Freq: Three times a day (TID) | ORAL | Status: DC | PRN
  Administered 2012-12-14 – 2012-12-15 (×2): 600 mg via ORAL
  Filled 2012-12-13 (×2): qty 3

## 2012-12-13 MED ORDER — FOLIC ACID 1 MG PO TABS
1.0000 mg | ORAL_TABLET | Freq: Every day | ORAL | Status: DC
  Administered 2012-12-13 – 2012-12-15 (×3): 1 mg via ORAL
  Filled 2012-12-13 (×3): qty 1

## 2012-12-13 MED ORDER — LORAZEPAM 1 MG PO TABS: 1.0000 mg | ORAL_TABLET | Freq: Three times a day (TID) | ORAL | Status: DC | PRN

## 2012-12-13 NOTE — ED Notes (Signed)
Pt here for ETOH detox.

## 2012-12-13 NOTE — ED Notes (Signed)
Patient requested and was granted use of the telephone.
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
## 2012-12-13 NOTE — ED Notes (Signed)
Patient asked for and received a Sprite soda.

## 2012-12-13 NOTE — ED Provider Notes (Signed)
History     CSN: [id number]  Arrival date & time 12/13/12  1217   First MD Initiated Contact with Patient 12/13/12 1241      Chief Complaint  Patient presents with  . Mental Health Problem    ETOH detox    (Consider location/radiation/quality/duration/timing/severity/associated sxs/prior treatment) HPI Comments: Patient is in ED requesting detox from alcohol.  Pt drinks 3-4 40 oz beers daily and has recently started drinking liquor in addition.  Pt also does cocaine and marijuana.  Admits to depression.  Denies SI, HI.  Has been coughing for several months, productive of yellow sputum.  Also has bloody stools, has been told this is due to hemorrhoids.  Pt wants help today because her teenage sons have told her that it is time, states she used to have a good life and used to be happy, now she is "skin and bones" because she does not eat, just drinks from the time she wakes up until she goes to bed, and she is "sick all the time."  Denies fevers, chills, CP, SOB, abdominal pain, hematemesis.  Last drink was just prior to arrival.  Has been drinking heavily 1-2 years.    The history is provided by the patient.    Past Medical History  Diagnosis Date  . PTSD (post-traumatic stress disorder)     Past Surgical History  Procedure Laterality Date  . Other surgical history      ovarian surgery  . Foot surgery    . Tubal ligation    . Abdominal hysterectomy      No family history on file.  History  Substance Use Topics  . Smoking status: Current Every Day Smoker -- 1.00 packs/day    Types: Cigarettes  . Smokeless tobacco: Never Used  . Alcohol Use: Yes     Comment: 3 - 40oz/day    OB History   Grav Para Term Preterm Abortions TAB SAB Ect Mult Living                  Review of Systems  Constitutional: Negative for fever and chills.  Respiratory: Positive for cough. Negative for shortness of breath.   Cardiovascular: Negative for chest pain.  Gastrointestinal: Negative for  nausea and abdominal pain.  Genitourinary: Negative for dysuria, urgency and frequency.  Psychiatric/Behavioral: Positive for dysphoric mood. Negative for suicidal ideas and self-injury.    Allergies  Review of patient's allergies indicates no known allergies.  Home Medications   Current Outpatient Rx  Name  Route  Sig  Dispense  Refill  . citalopram (CELEXA) 20 MG tablet   Oral   Take 20 mg by mouth daily.           . traZODone (DESYREL) 50 MG tablet   Oral   Take 50 mg by mouth at bedtime.           BP 168/98  Pulse 110  Temp(Src) 97.7 F (36.5 C) (Oral)  Resp 20  SpO2 97%  Physical Exam  Nursing note and vitals reviewed. Constitutional: She appears well-developed and well-nourished. No distress.  HENT:  Head: Normocephalic and atraumatic.  Neck: Neck supple.  Cardiovascular: Normal rate and regular rhythm.   Pulmonary/Chest: Effort normal and breath sounds normal. No stridor. No respiratory distress. She has no wheezes. She has no rales.  +cough  Abdominal: Soft. Bowel sounds are normal. She exhibits no distension. There is tenderness in the epigastric area. There is no rebound and no guarding.  Neurological: She is  alert.  Skin: She is not diaphoretic.  Psychiatric: Her speech is normal and behavior is normal. She exhibits a depressed mood. She expresses no homicidal and no suicidal ideation.  Cries during interview    ED Course  Procedures (including critical care time)  Labs Reviewed  CBC WITH DIFFERENTIAL - Abnormal; Notable for the following:    Hemoglobin 16.3 (*)    MCH 35.8 (*)    MCHC 36.5 (*)    Neutrophils Relative 40 (*)    Basophils Relative 2 (*)    All other components within normal limits  COMPREHENSIVE METABOLIC PANEL - Abnormal; Notable for the following:    BUN 3 (*)    Creatinine, Ser 0.41 (*)    AST 90 (*)    ALT 38 (*)    Alkaline Phosphatase 159 (*)    Total Bilirubin 0.2 (*)    All other components within normal limits   URINE RAPID DRUG SCREEN (HOSP PERFORMED) - Abnormal; Notable for the following:    Cocaine POSITIVE (*)    Tetrahydrocannabinol POSITIVE (*)    All other components within normal limits  URINALYSIS, ROUTINE W REFLEX MICROSCOPIC - Abnormal; Notable for the following:    APPearance HAZY (*)    All other components within normal limits  LIPASE, BLOOD - Abnormal; Notable for the following:    Lipase 65 (*)    All other components within normal limits  ETHANOL - Abnormal; Notable for the following:    Alcohol, Ethyl (B) 235 (*)    All other components within normal limits  SALICYLATE LEVEL - Abnormal; Notable for the following:    Salicylate Lvl <2.0 (*)    All other components within normal limits  ACETAMINOPHEN LEVEL  POCT PREGNANCY, URINE   Dg Chest 2 View  12/13/2012  *RADIOLOGY REPORT*  Clinical Data: EtOH detox, abdominal pain  CHEST - 2 VIEW  Comparison: None.  Findings: Cardiomediastinal silhouette is unremarkable.  No acute infiltrate or pleural effusion.  No pulmonary edema.  Bony thorax is unremarkable  IMPRESSION: .  No active disease.   Original Report Authenticated By: Natasha Mead, M.D.      1. Alcohol abuse   2. Depression       MDM  Alcoholic requesting detox.  Admits to depression.  No SI, HI.  Medically clear.  On CIWA protocol with psych hold orders.  Discussed pt with Dr Radford Pax who will speak with ACT Team.  ACT team currently unavailable, until 7pm.  Will need placement for detox.  Pt is voluntary.         Trixie Dredge, PA-C 12/13/12 1630

## 2012-12-14 MED ORDER — TRAZODONE HCL 50 MG PO TABS
50.0000 mg | ORAL_TABLET | Freq: Every day | ORAL | Status: DC
  Administered 2012-12-14 – 2012-12-15 (×2): 50 mg via ORAL
  Filled 2012-12-14 (×2): qty 1

## 2012-12-14 MED ORDER — ENSURE COMPLETE PO LIQD
237.0000 mL | Freq: Once | ORAL | Status: AC
  Administered 2012-12-14: 237 mL via ORAL
  Filled 2012-12-14: qty 237

## 2012-12-14 MED ORDER — ENSURE COMPLETE PO LIQD
237.0000 mL | Freq: Two times a day (BID) | ORAL | Status: DC
  Administered 2012-12-15: 237 mL via ORAL
  Filled 2012-12-14: qty 237

## 2012-12-14 MED ORDER — CITALOPRAM HYDROBROMIDE 10 MG PO TABS
20.0000 mg | ORAL_TABLET | Freq: Every day | ORAL | Status: DC
  Administered 2012-12-14 – 2012-12-15 (×2): 20 mg via ORAL
  Filled 2012-12-14 (×2): qty 2

## 2012-12-14 NOTE — ED Notes (Signed)
Patient sleeping on stretcher at this time. Patient in no acute distress, resp are even and unlabored. Call light at bedside. Will continue to monitor.

## 2012-12-14 NOTE — ED Notes (Signed)
Act team renita aware that [facility] has a [facility] bed available and will be here to fa
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
## 2012-12-14 NOTE — ED Notes (Signed)
Patient waiting for acceptance to detox facility. States last etoh was 5/3 around 9am. States she has not been to detox before

## 2012-12-14 NOTE — ED Notes (Signed)
Greg from ACT in speaking with patient at this time.

## 2012-12-14 NOTE — ED Notes (Signed)
Patient ambulated to restroom. Patient is unsteady on feet, denies the need for wheelchair. Patient began dry heaving in restroom. Ambulated back to bed. Will continue to monitor.

## 2012-12-14 NOTE — ED Notes (Signed)
PATIENT UP TO RESTROOM. GAIT STEADY. THEN TO NURSES STATION TO USE PHONE

## 2012-12-14 NOTE — ED Provider Notes (Signed)
Medical screening examination/treatment/procedure(s) were performed by non-physician practitioner and as supervising physician I was immediately available for consultation/collaboration.   Cinde Ebert, MD 12/14/12 1541 

## 2012-12-14 NOTE — [facility] Counselor (Signed)
[facility] cannot accept Medicaid for Detox, possibly for treatment but cannot assist with Detox, per Delice Bison... Pt still on waiting list for [facility]

## 2012-12-14 NOTE — ED Notes (Signed)
Pt came to the door complaining of both legs shaking. A wheelchair was gotten to transport pt to the bathroom. Pt transported to the bathroom by wheelchair and when she was done pt was transported back to her bed. Pt assisted into bed from wheelchair.

## 2012-12-15 ENCOUNTER — Encounter (HOSPITAL_COMMUNITY): Payer: Self-pay | Admitting: Emergency Medicine

## 2012-12-15 MED ORDER — LOPERAMIDE HCL 2 MG PO CAPS
2.0000 mg | ORAL_CAPSULE | Freq: Once | ORAL | Status: AC
  Administered 2012-12-15: 2 mg via ORAL
  Filled 2012-12-15: qty 1

## 2012-12-15 NOTE — ED Notes (Signed)
Patient ambulating to restroom. States she is having abdominal cramping and diarrhea. EDP notified.

## 2012-12-15 NOTE — BHH Counselor (Signed)
Pt accepted to [facility] 300 hall by Donell Sievert [facility] to attending, Geoffery Lyons MD.

## 2012-12-15 NOTE — ED Provider Notes (Signed)
Patient accepted at [facility] under Dr. Dub Mikes for suicidal ideations. Stable for transfer.   Richardean Canal, MD 12/15/12 2200

## 2012-12-15 NOTE — ED Notes (Signed)
Patient is resting comfortably. No acute distress noted, resp are even and unlabored. Call light at bedside. Will continue to monitor.

## 2012-12-15 NOTE — ED Notes (Signed)
ACT team member in room speaking with patient. 

## 2012-12-15 NOTE — ED Notes (Signed)
Patient report obtained, care taken over at this time.  

## 2012-12-15 NOTE — BHH Counselor (Signed)
Pt submitted for admission to [facility]. Felicia Morn, NP reviewed clinical information and requests "provider look her up on the narcotics prescription website to see if she is abusing her prescriptions for narcotics, since her UDS is negative." Per Laverle Hobby, [facility] the adult unit is at capacity.   Harlin Rain Patsy Baltimore, LPC, [facility]  Assessment Counselor

## 2012-12-15 NOTE — ED Notes (Signed)
Patient resting comfortably on bed at this time. Patient appears to be in good spirits, smiling and talking, and cooperative. Patient watching TV at this time. Informed patient she has her Ensure when she is ready. Patient in no acute distress. Call light at bedside. Will continue to monitor.

## 2012-12-15 NOTE — ED Notes (Signed)
Patient is resting comfortably and sleeping at this time. No acute distress noted, resp are even and unlabored. Will continue to monitor.

## 2012-12-15 NOTE — ED Notes (Signed)
Pt laughing, heard from mother- feeling better about children-- they are being taken care of- her sister is staying with kids at her house.

## 2012-12-15 NOTE — ED Notes (Signed)
Patient is resting comfortably and sleeping at this time. No acute distress noted, resp are even and unlabored. Call light on bed. Will continue to monitor.

## 2012-12-15 NOTE — ED Provider Notes (Signed)
Patient resting comfortably this morning without overnight complaints or behavior problems. Awaiting alcohol detox.   Gilda Crease, MD 12/15/12 [phone]

## 2012-12-15 NOTE — ED Notes (Signed)
Patient is resting comfortably and sleeping at this time. No acute distress noted, resp are even and unlabored. Call light at bedside. Will continue to monitor.

## 2012-12-15 NOTE — BHH Counselor (Signed)
Pt submitted for admission to [facility]. Felicia Morn, NP reviewed clinical information and said that due to rectal bleeding and elevated liver enzymes and lipase he recommends Pt needs further medical evaluation before being considered for admission to [facility]. Per Laverle Hobby, [facility] the adult unit is at capacity.   Harlin Rain Patsy [place], LPC, [facility]  Assessment Counselor
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
## 2012-12-16 ENCOUNTER — Encounter (HOSPITAL_COMMUNITY): Payer: Self-pay

## 2012-12-16 ENCOUNTER — Inpatient Hospital Stay (HOSPITAL_COMMUNITY)
Admission: AD | Admit: 2012-12-16 | Discharge: 2012-12-19 | DRG: 897 | Disposition: A | Discharge status: Home / Self Care | Payer: Medicaid Other | Source: Intra-hospital | Attending: Psychiatry | Admitting: Psychiatry

## 2012-12-16 DIAGNOSIS — F1020 Alcohol dependence, uncomplicated: Principal | ICD-10-CM | POA: Diagnosis present

## 2012-12-16 DIAGNOSIS — F332 Major depressive disorder, recurrent severe without psychotic features: Secondary | ICD-10-CM | POA: Diagnosis present

## 2012-12-16 DIAGNOSIS — F10129 Alcohol abuse with intoxication, unspecified: Secondary | ICD-10-CM | POA: Diagnosis present

## 2012-12-16 DIAGNOSIS — Z79899 Other long term (current) drug therapy: Secondary | ICD-10-CM

## 2012-12-16 DIAGNOSIS — F10229 Alcohol dependence with intoxication, unspecified: Secondary | ICD-10-CM

## 2012-12-16 HISTORY — DX: Alcohol dependence, uncomplicated: F10.20

## 2012-12-16 MED ORDER — HYDROXYZINE HCL 25 MG PO TABS: 25.0000 mg | ORAL_TABLET | Freq: Four times a day (QID) | ORAL | Status: AC | PRN

## 2012-12-16 MED ORDER — LOPERAMIDE HCL 2 MG PO CAPS: 2.0000 mg | ORAL_CAPSULE | ORAL | Status: AC | PRN

## 2012-12-16 MED ORDER — ONDANSETRON 4 MG PO TBDP: 4.0000 mg | ORAL_TABLET | Freq: Four times a day (QID) | ORAL | Status: AC | PRN

## 2012-12-16 MED ORDER — ACETAMINOPHEN 325 MG PO TABS: 650.0000 mg | ORAL_TABLET | Freq: Four times a day (QID) | ORAL | Status: DC | PRN

## 2012-12-16 MED ORDER — ALUM & MAG HYDROXIDE-SIMETH 200-200-20 MG/5ML PO SUSP: 30.0000 mL | ORAL | Status: DC | PRN

## 2012-12-16 MED ORDER — TRAZODONE HCL 50 MG PO TABS
50.0000 mg | ORAL_TABLET | Freq: Every evening | ORAL | Status: DC | PRN
  Administered 2012-12-16: 50 mg via ORAL
  Filled 2012-12-16 (×5): qty 1

## 2012-12-16 MED ORDER — NICOTINE 14 MG/24HR TD PT24
14.0000 mg | MEDICATED_PATCH | Freq: Every day | TRANSDERMAL | Status: DC
  Administered 2012-12-16 – 2012-12-19 (×4): 14 mg via TRANSDERMAL
  Filled 2012-12-16 (×8): qty 1

## 2012-12-16 MED ORDER — VITAMIN B-1 100 MG PO TABS
100.0000 mg | ORAL_TABLET | Freq: Every day | ORAL | Status: DC
  Administered 2012-12-17 – 2012-12-19 (×3): 100 mg via ORAL
  Filled 2012-12-16 (×5): qty 1

## 2012-12-16 MED ORDER — CITALOPRAM HYDROBROMIDE 20 MG PO TABS
20.0000 mg | ORAL_TABLET | Freq: Every day | ORAL | Status: DC
  Administered 2012-12-16 – 2012-12-17 (×2): 20 mg via ORAL
  Filled 2012-12-16 (×4): qty 1

## 2012-12-16 MED ORDER — CHLORDIAZEPOXIDE HCL 25 MG PO CAPS
25.0000 mg | ORAL_CAPSULE | Freq: Three times a day (TID) | ORAL | Status: AC
  Administered 2012-12-17 – 2012-12-18 (×3): 25 mg via ORAL
  Filled 2012-12-16 (×3): qty 1

## 2012-12-16 MED ORDER — ADULT MULTIVITAMIN W/MINERALS CH
1.0000 | ORAL_TABLET | Freq: Every day | ORAL | Status: DC
  Administered 2012-12-16 – 2012-12-19 (×4): 1 via ORAL
  Filled 2012-12-16 (×7): qty 1

## 2012-12-16 MED ORDER — MAGNESIUM HYDROXIDE 400 MG/5ML PO SUSP: 30.0000 mL | Freq: Every day | ORAL | Status: DC | PRN

## 2012-12-16 MED ORDER — CHLORDIAZEPOXIDE HCL 25 MG PO CAPS: 25.0000 mg | ORAL_CAPSULE | Freq: Every day | ORAL | Status: DC

## 2012-12-16 MED ORDER — ENSURE COMPLETE PO LIQD
237.0000 mL | Freq: Two times a day (BID) | ORAL | Status: DC
  Administered 2012-12-16 (×2): 237 mL via ORAL

## 2012-12-16 MED ORDER — CHLORDIAZEPOXIDE HCL 25 MG PO CAPS: 25.0000 mg | ORAL_CAPSULE | Freq: Four times a day (QID) | ORAL | Status: AC | PRN

## 2012-12-16 MED ORDER — CHLORDIAZEPOXIDE HCL 25 MG PO CAPS
25.0000 mg | ORAL_CAPSULE | ORAL | Status: AC
  Administered 2012-12-18 – 2012-12-19 (×2): 25 mg via ORAL
  Filled 2012-12-16 (×2): qty 1

## 2012-12-16 MED ORDER — THIAMINE HCL 100 MG/ML IJ SOLN: 100.0000 mg | Freq: Once | INTRAMUSCULAR | Status: DC

## 2012-12-16 MED ORDER — CHLORDIAZEPOXIDE HCL 25 MG PO CAPS
25.0000 mg | ORAL_CAPSULE | Freq: Four times a day (QID) | ORAL | Status: AC
  Administered 2012-12-16 – 2012-12-17 (×6): 25 mg via ORAL
  Filled 2012-12-16 (×6): qty 1
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
## 2012-12-16 NOTE — BHH Group Notes (Signed)
BHH LCSW Group Therapy  12/16/2012 1:15 PM  Type of Therapy:  Group Therapy 1:15 to 2:30   Participation Level:  Active  Participation Quality:  Attentive and Sharing  Affect:  Appropriate  Cognitive:  Appropriate  Insight:  Developing/Improving  Engagement in Therapy:  Developing/Improving  Modes of Intervention:  Discussion, Education, Socialization and Support  Summary of Progress/Problems: Patient attended group presentation by staff member of  [employer] [facility] [employer] [facility] [place] ([facility]). [name] was attentive and appropriate during session. She asked for more information regarding groups for those with PTSD.    Felicia Perkins
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
## 2012-12-16 NOTE — ED Notes (Signed)
Patient going to [facility] at this time via Engineer, materials. Patient in stable condition. Patient ambulating out at this time. Patients belongings with security.

## 2012-12-16 NOTE — Progress Notes (Signed)
Recreation Therapy Notes  Date: 05.06.2014 Time: 3:00pm Location: [address] Day Room      Group Topic/Focus: Communication, Journalist, newspaper, Team Building  Participation Level: Minimal  Participation Quality: Appropriate  Affect: Euthymic  Cognitive: Appropriate   Additional Comments: Activity: Flip Flop ; Explanation: LRT placed a sheet on the floor. Patients were instructed to stand on the sheet. As a group patients were instructed to flip the sheet over without stepping off of the sheet.   Patient actively particiapted in group activity. Patient contributed to group discussion about the importance of using communication skills, team building skills and problem solving skills outside of hospital to build a support system for recovery.   Marykay Lex Odis Wickey, LRT/CTRS  Domonic Hiscox L 12/16/2012 4:53 PM

## 2012-12-16 NOTE — ED Notes (Signed)
Patient report called to Casimiro Needle for admission to [facility]. Nurse voiced understanding of report and had no further questions. Patient is in stable condition at this time for transport. Patient will be going to [facility] via security.

## 2012-12-16 NOTE — Progress Notes (Signed)
Pt has been up and active in the milieu today.  She is interacting with peers and staff appropriately.  Reported her sleep as fair energy level low ability to pay attention as good.  She rated her depression a 4 hopelessness a 1 and her anxiety a 3 on her self-inventory.  She denies any symptoms of withdrawal except for chilling and tremors.  When assessed pt's termor, she noted to not have any at that time.  She denied any S/H ideation or A/V hallucinations. She stated,"maintain my sobriety and care for my children and stay away from wrong crowds who drink".  She is hoping to go to [facility] upon discharge.

## 2012-12-16 NOTE — [facility] Group Notes (Signed)
[facility] LCSW Aftercare Discharge Planning Group Note   12/16/2012 8:45 AM  Participation Quality:  Appropriiate  Mood/Affect:  Anxious  Depression Rating:  2  Anxiety Rating:  4  Thoughts of Suicide:  No Will you contract for safety?   NA  Current AVH:  No  Plan for Discharge/Comments:  Uncertain at this time; does report she wants to follow up at [facility] and [employer]:  Mother  Supports: Mother, children, family  Clide Dales

## 2012-12-16 NOTE — Tx Team (Signed)
Initial Interdisciplinary Treatment Plan  PATIENT STRENGTHS: (choose at least two) Active sense of humor Communication skills General fund of knowledge Motivation for treatment/growth  PATIENT STRESSORS: Financial difficulties Legal issue Substance abuse   PROBLEM LIST: Problem List/Patient Goals Date to be addressed Date deferred Reason deferred Estimated date of resolution  Substance abuse 12/26/12     depression 12/26/12     anxiety 12/26/12     Risk for suicide 12/26/12                                    DISCHARGE CRITERIA:  Improved stabilization in mood, thinking, and/or behavior Motivation to continue treatment in a less acute level of care Verbal commitment to aftercare and medication compliance Withdrawal symptoms are absent or subacute and managed without 24-hour nursing intervention  PRELIMINARY DISCHARGE PLAN: Attend PHP/IOP Attend 12-step recovery group Outpatient therapy  PATIENT/FAMIILY INVOLVEMENT: This treatment plan has been presented to and reviewed with the patient, Felicia Perkins.  The patient and family have been given the opportunity to ask questions and make suggestions.  Jacques Navy A 12/16/2012, 2:23 AM

## 2012-12-16 NOTE — Progress Notes (Signed)
Patient ID: Felicia Perkins, female   DOB: [birth year], 38 y.o.   MRN: [id number]  Admission Note:  D:37 yr female who presents [facility] in no acute distress for the treatment of ETOH detox and depression. Pt appears flat and depressed. Pt was calm and cooperative with admission process.Pt denies SI/HI/ AVH/ pain  Pt has Past medical Hx of PTSD, Foot surgery, Tubal ligation, and Abdominal hysterectomy. Pt states she tried to detox in March 2014, but it didn't go well. Pt states she tried cocaine just before coming in, she is not a regular user of cocaine. Pt states she has been "drinking a lot for the last 2 years" A:Skin was assessed and found to be clear of any abnormal marks apart from Tattoos on neck and R-leg. Eczema on arms and abdomen. Bruise on R-knee, L-calf, surgery nodule on sole of foot.  POC and unit policies explained and understanding verbalized. Consents obtained. Food and fluids offered, and denied .   R:Pt had no additional questions or concerns.

## 2012-12-16 NOTE — [facility] Counselor (Signed)
Adult Comprehensive Assessment  Patient ID: Felicia Perkins, female   DOB: [birth year], 37 y.o.   MRN: [id number]  Information Source: Information source: Patient  Current Stressors:  Educational / Learning stressors: NA Employment / Job issues: Unemployed Family Relationships: Horticulturist, commercial / Lack of resources (include bankruptcy): Stressed Housing / Lack of housing: NA Physical health (include injuries & life threatening diseases): NA Social relationships: Few friends who are a close knit group Substance abuse: Yes Bereavement / Loss: NA  Living/Environment/Situation:  Living Arrangements: Children Living conditions (as described by patient or guardian): Like our rental unit, feel safe there How long has patient lived in current situation?: 6 years What is atmosphere in current home: Comfortable  Family History:  Marital status: Long term relationship Long term relationship, how long?: 17 years What types of issues is patient dealing with in the relationship?: Significant other moved out recently and is breaking off relationship Additional relationship information: NA Does patient have children?: Yes How many children?: 3 How is patient's relationship with their children?: Good with 17, 16 and 10 YO; two oldest helped patient get to hospital  Childhood History:  By whom was/is the patient raised?: Mother Additional childhood history information: Father left family before patient recalls building a relationship with him Description of patient's relationship with caregiver when they were a child: Pretty good with mother Patient's description of current relationship with people who raised him/her: Very good, mother is #1 supporter Does patient have siblings?: Yes Number of Siblings: 4 Description of patient's current relationship with siblings: Good Did patient suffer any verbal/emotional/physical/sexual abuse as a child?: No Did patient suffer from severe childhood neglect?:  No Was the patient ever a victim of a crime or a disaster?: No Witnessed domestic violence?: No Has patient been effected by domestic violence as an adult?: No  Education:  Highest grade of school patient has completed: 12 Currently a Consulting civil engineer?: No Learning disability?: No  Employment/Work Situation:   Employment situation: Unemployed Patient's job has been impacted by current illness: No What is the longest time patient has a held a job?: 7 years 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 THC once or twice monthly and some cocaine last week If attempted suicide, did drugs/alcohol play a role in this?:  (No attempt) Alcohol/Substance Abuse Treatment Hx: Denies past history Has alcohol/substance abuse ever caused legal problems?: Yes (Patient has court date on May 13 for DWI)  Social Support System:   Patient's Community Support System: Production assistant, radio System: Family, friends, ex Type of faith/religion: Not really How does patient's faith help to cope with current illness?: Mother is a TEFL teacher witness so I get it from her  Leisure/Recreation:   Leisure and Hobbies: Not really  Strengths/Needs:   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 significant other supported [place] for years In what areas does patient struggle / problems for patient: Alcohol, stuffing my feelings until I explode  Discharge Plan:   Does patient have access to transportation?: Yes Will patient be returning  to  same living situation after discharge?: Yes Currently receiving community mental health services: Yes (From Whom) Vesta Mixer) Does patient have financial barriers related to discharge medications?: No  Summary/Recommendations:   Summary and Recommendations (to be completed by the evaluator): Patient is single unemployed [place] American female admitted with diagnosis of ETOH dependence and Depressive D/O NOS. Patient would benefit from crisis stabilization, medication evaluation, therapy groups for processing thoughts/feelings/experiences, psycho ed groups for coping skills, and case management for discharge planning.    Clide Dales. 12/16/2012

## 2012-12-17 DIAGNOSIS — F329 Major depressive disorder, single episode, unspecified: Secondary | ICD-10-CM

## 2012-12-17 DIAGNOSIS — F39 Unspecified mood [affective] disorder: Secondary | ICD-10-CM

## 2012-12-17 MED ORDER — QUETIAPINE FUMARATE 25 MG PO TABS
25.0000 mg | ORAL_TABLET | Freq: Three times a day (TID) | ORAL | Status: DC
  Administered 2012-12-17 – 2012-12-19 (×7): 25 mg via ORAL
  Filled 2012-12-17 (×13): qty 1

## 2012-12-17 MED ORDER — MIRTAZAPINE 30 MG PO TABS
30.0000 mg | ORAL_TABLET | Freq: Every day | ORAL | Status: DC
  Administered 2012-12-18: 30 mg via ORAL
  Filled 2012-12-17 (×4): qty 1

## 2012-12-17 MED ORDER — BOOST / RESOURCE BREEZE PO LIQD
1.0000 | Freq: Three times a day (TID) | ORAL | Status: DC
  Administered 2012-12-17 – 2012-12-19 (×5): 1 via ORAL
  Filled 2012-12-17 (×12): qty 1

## 2012-12-17 NOTE — Progress Notes (Signed)
[facility] MD Progress Note  12/17/2012 3:47 PM Felicia Perkins  MRN:  [id number] Subjective:  Endorses persistent depression, mood instability. Sates that the medications she was taking were not working for her. Would like to try new medications. Endorses insomnia, (slept with the Trazodone but groggy in the morning) Endorses very poor appetite and anxiety as well as irritability, anger. Triggers are braking up with her boyfriend of 17 years, her daughter 72 acting out and last night the daughter getting in trouble with the police. States that she knows that drinking is not the way to cope with the stressors Diagnosis:  Alcohol Dependence, Mood Disorder NOS, MDD  ADL's:  Intact  Sleep: Fair  Appetite:  Poor  Suicidal Ideation:  Plan:  denies Intent:  denies Means:  denies Homicidal Ideation:  Plan:  denies Intent:  denies Means:  denies AEB (as evidenced by):  Psychiatric Specialty Exam: Review of Systems  Constitutional: Positive for weight loss.  HENT: Negative.   Eyes: Negative.   Respiratory: Negative.   Cardiovascular: Negative.   Gastrointestinal: Negative.   Genitourinary: Negative.   Musculoskeletal: Negative.   Skin: Negative.   Neurological: Positive for weakness.  Endo/Heme/Allergies: Negative.   Psychiatric/Behavioral: Positive for depression and substance abuse. The patient is nervous/anxious and has insomnia.     Blood pressure 124/81, pulse 69, temperature 98.5 F (36.9 C), temperature source Oral, resp. rate 16, height 5\' 4"  (1.626 m), weight 48.081 kg (106 lb).Body mass index is 18.19 kg/(m^2).  General Appearance: Fairly Groomed  Patent attorney::  Fair  Speech:  Clear and Coherent  Volume:  Normal  Mood:  Anxious, Depressed and Irritable  Affect:  anxious, worried  Thought Process:  Coherent and Goal Directed  Orientation:  Full (Time, Place, and Person)  Thought Content:  worries, concerns  Suicidal Thoughts:  No  Homicidal Thoughts:  No  Memory:   Immediate;   Fair Recent;   Fair Remote;   Fair  Judgement:  Fair  Insight:  Shallow  Psychomotor Activity:  Restlessness  Concentration:  Fair  Recall:  Fair  Akathisia:  No  Handed:  Right  AIMS (if indicated):     Assets:  Desire for Improvement  Sleep:  Number of Hours: 6.25   Current Medications: Current Facility-Administered Medications  Medication Dose Route Frequency Provider Last Rate Last Dose  . acetaminophen (TYLENOL) tablet 650 mg  650 mg Oral Q6H PRN [name], PA-C      . alum & mag hydroxide-simeth (MAALOX/MYLANTA) 200-200-20 MG/5ML suspension 30 mL  30 mL Oral Q4H PRN [name], PA-C      . chlordiazePOXIDE (LIBRIUM) capsule 25 mg  25 mg Oral Q6H PRN [name], PA-C      . chlordiazePOXIDE (LIBRIUM) capsule 25 mg  25 mg Oral TID [name], PA-C       Followed by  . [START ON 12/18/2012] chlordiazePOXIDE (LIBRIUM) capsule 25 mg  25 mg Oral BH-qamhs [name], PA-C       Followed by  . [START ON 12/20/2012] chlordiazePOXIDE (LIBRIUM) capsule 25 mg  25 mg Oral Daily [name], PA-C      . feeding supplement (RESOURCE BREEZE) liquid 1 Container  1 Container Oral TID BM [name], MD   1 Container at 12/17/12 1422  . hydrOXYzine (ATARAX/VISTARIL) tablet 25 mg  25 mg Oral Q6H PRN [name], PA-C      . loperamide (IMODIUM) capsule 2-4 mg  2-4 mg  Oral PRN [name], PA-C      . magnesium hydroxide (MILK OF MAGNESIA) suspension 30 mL  30 mL Oral Daily PRN [name], PA-C      . mirtazapine (REMERON) tablet 30 mg  30 mg Oral QHS [name], MD      . multivitamin with minerals tablet 1 tablet  1 tablet Oral Daily [name], PA-C   1 tablet at 12/17/12 0754  . nicotine (NICODERM CQ - dosed in mg/24 hours) patch 14 mg  14 mg Transdermal Q0600 [name], PA-C   14 mg at 12/17/12 1610  . ondansetron (ZOFRAN-ODT) disintegrating tablet 4 mg  4 mg Oral Q6H PRN [name], PA-C      . QUEtiapine (SEROQUEL)  tablet 25 mg  25 mg Oral TID [name], MD   25 mg at 12/17/12 1422  . thiamine (B-1) injection 100 mg  100 mg Intramuscular Once [name], PA-C      . thiamine (VITAMIN B-1) tablet 100 mg  100 mg Oral Daily [name], PA-C   100 mg at 12/17/12 9604    Lab Results: No results found for this or any previous visit (from the past 48 hour(s)).  Physical Findings: AIMS: Facial and Oral Movements Muscles of Facial Expression: None, normal Lips and Perioral Area: None, normal Jaw: None, normal Tongue: None, normal,Extremity Movements Upper (arms, wrists, hands, fingers): None, normal Lower (legs, knees, ankles, toes): None, normal, Trunk Movements Neck, shoulders, hips: None, normal, Overall Severity Severity of abnormal movements (highest score from questions above): None, normal Incapacitation due to abnormal movements: None, normal Patient's awareness of abnormal movements (rate only patient's report): No Awareness, Dental Status Current problems with teeth and/or dentures?: No Does patient usually wear dentures?: No  CIWA:  CIWA-Ar Total: 1 COWS:  COWS Total Score: 2  Treatment Plan Summary: Daily contact with patient to assess and evaluate symptoms and progress in treatment Medication management  Plan: Supportive approach/coping skills/relapse prevention/anger management           Remeron 30 mg HS           Seroquel 25 mg TID           Consider Neurontin  Medical Decision Making Problem Points:  Review of psycho-social stressors (1) Data Points:  Review of medication regiment & side effects (2) Review of new medications or change in dosage (2)  I certify that inpatient services furnished can reasonably be expected to improve the patient's condition.   Felicia Perkins A 12/17/2012, 3:47 PM

## 2012-12-17 NOTE — Progress Notes (Signed)
Pt reports she is doing well today.  She denies SI/HI/AV at this time.  She says she has attended most groups.  She is drinking Resource supplements for decreased appetite.  She will start Remeron tonight as she says the Trazodone makes her drowsy and difficult to wake up in the morning.  She says her withdrawal symptoms are very minimal.  She is hopeful to discharge in the next day or two.  Support and encouragement offered.  Pt makes her needs known to staff.  Safety maintained with q15 minute checks.

## 2012-12-17 NOTE — [facility] Group Notes (Signed)
[facility] LCSW Group Therapy  12/17/2012 1:45 PM  Type of Therapy:  Group Therapy 1:15 to 2:30  Participation Level:  Did Not Attend  Carney Bern, LCSWA

## 2012-12-17 NOTE — Progress Notes (Signed)
Pt reports she is doing better this evening since being admitted.  She says she has attended some groups.  She seems vested in her treatment and wants to get help.  She denies SI/HI/AV.  She says her withdrawal symptoms are minimal at this point.  She plans to return home at discharge.  Pt is quiet and stays to herself.  Pt encouraged to make her needs known.  Pt voices understanding.  Support and encouragement offered.  Safety maintained with q15 minute checks.

## 2012-12-17 NOTE — Progress Notes (Signed)
NUTRITION ASSESSMENT  Pt identified as at risk on the Malnutrition Screen Tool  INTERVENTION: 1. Educated patient on the importance of nutrition and encouraged intake of food and beverages. 2. Discussed weight goals. 3. Supplements: Continue Resource Breeze tid. 4.  Continue MVI and thiamine supplements daily.  NUTRITION DIAGNOSIS: Unintentional weight loss related to sub-optimal intake as evidenced by pt report.   Underweight related to ETOH abuse AEB BMI <18.5.  Goal: Pt to meet >/= 90% of their estimated nutrition needs.  Monitor:  PO intake  Assessment:  Patient admitted with ETOH abuse.  Drinking from breakfast til bedtime, not eating.  Lost 25-30 lbs in 3 months.  Patient reports a poor appetite for a few months and has to force self to eat.  UBW=135.  78.5% of UBW with a 21.5% weight loss in the past 3-4 months.  Making self eat here with good intake of meals and snacks.  Drinks [employer] and likes this.  Patient meets criteria for severe malnutrition related to chronic illness AEB weight loss of 21.5% in the past 3-4 months, and intake <75% for greater than 1 month.    37 y.o. female  Height: Ht Readings from Last 1 Encounters:  12/16/12 5\' 4"  (1.626 m)    Weight: Wt Readings from Last 1 Encounters:  12/16/12 106 lb (48.081 kg)    Weight Hx: Wt Readings from Last 10 Encounters:  12/16/12 106 lb (48.081 kg)     BMI:  Body mass index is 18.19 kg/(m^2). Pt meets criteria for underweight based on current BMI.  Estimated Nutritional Needs: Kcal: 25-30 kcal/kg Protein: > 1 gram protein/kg Fluid: 1 ml/kcal  Diet Order: General Pt is also offered choice of unit snacks mid-morning and mid-afternoon.  Pt is eating as desired.   Lab results and medications reviewed.   Oran Rein, RD, LDN Clinical Inpatient Dietitian Pager:  [phone] Weekend and after hours pager:  [phone]

## 2012-12-17 NOTE — Tx Team (Signed)
Interdisciplinary Treatment Plan Update (Adult)  Date: 12/17/2012  Time Reviewed: 9:37 AM   Progress in Treatment: Attending groups: Yes Participating in groups: Yes Taking medication as prescribed:  Yes Tolerating medication:  Yes Family/Significant othe contact made: No Patient understands diagnosis: Yes Discussing patient identified problems/goals with staff: Yes Medical problems stabilized or resolved:  Yes Denies suicidal/homicidal ideation: Yes Patient has not harmed self or Others: Yes  New problem(s) identified: None Identified  Discharge Plan or Barriers:  CSW to Refer Patient to [facility]  Additional comments: N/A  Reason for Continuation of Hospitalization: Anxiety Depression Medication stabilization Withdrawal symptoms   Estimated length of stay: 3 days  For review of initial/current patient goals, please see plan of care.  Attendees: Patient:     Family:     Physician:  Geoffery Lyons 12/17/2012 9:37 AM   Nursing:   Robbie Louis, RN 12/17/2012 9:37 AM   Clinical Social Worker Ronda Fairly 12/17/2012 9:37 AM   Other:  Harold Barban, RN 12/17/2012 9:37 AM   Other:   12/17/2012 9:37 AM   Other:   12/17/2012 9:37 AM   Other:   12/17/2012 9:37 AM    Scribe for Treatment Team:   Carney Bern, LCSWA  12/17/2012 9:37 AM

## 2012-12-17 NOTE — Progress Notes (Signed)
Pt has been up for groups today.  She reported her sleep as well appetite improving energy normal and ability to pay attention as good.  She rated her depression a 2 hopelessness a 1 and her anxiety a 3 on her self-inventory. She denies any symptoms of withdrawal.  She denies any S/H ideation or A/V hallucinations. She wants to go to [facility] for her counseling and medication and then attend an AA group that she used to go to in the past. She wrote on her self-inventory that "I appreciate the help and support that has been given to me and I hope, well I won't let y'all down or my family.  Pt is to start seroquel 25 mg TID and remeron 30 mg at bedtime for sleep.  Dr. Dub Mikes discontinued the trazodone and celexa.  Pt  has an ordered resource TID

## 2012-12-17 NOTE — [facility] Group Notes (Signed)
[facility] LCSW Aftercare Discharge Planning Group Note   12/17/2012 8:45 AM  Participation Quality:  Appropriated  Mood/Affect:  Anxious, Depressed and Irritable  Depression Rating:  2; later told CSW after group her depression was an 8.  Patient reports the kid's father, Who recently broke up with her after 17 year relationship went toher sisters home last evening and took the kids home with him  Anxiety Rating:  2; later informed CSW after group her anxiety level is an 8-9  Thoughts of Suicide:  Negative Will you contract for safety?   NA  Current AVH:  No  Plan for Discharge/Comments:  Follow up at [facility]; return to [employer]: Family  Supports: Mother   Clide Dales

## 2012-12-18 NOTE — Progress Notes (Signed)
D   Pt has attended groups today  She is pleasant and appropriate   She complains of poor sleep  A   Verbal support given  Medications administered and effectiveness monitored  Q 15 min checks  R   Pt safe at present

## 2012-12-18 NOTE — Progress Notes (Signed)
D:  Patient's self inventory sheet, patient sleeps well, has improving appetite, normal energy level, good attention span.  Rated depression #2, hopelessness #1.  Denied SI.  Denied physical problems.  Worst pain #1, zero pain goal.  After discharge, plans to "support groups and therapy and stay on meds.  Can I get a letter of proof of my treatment for my lawyer."  Does have discharge plans.  No problems taking meds after discharge. A:  Medications administered per MD orders.  Emotional support and encouragement given patient.   R:  Patient denied SI and HI.  Denied A/V hallucinations.  Patient remains safe on unit.  Will continue to monitor patient with 15 minute checks for safety.

## 2012-12-18 NOTE — Progress Notes (Signed)
[facility] MD Progress Note  12/18/2012 2:28 PM Felicia Perkins  MRN:  [id number] Subjective:  [name] has tolerated the medications well without any side effects. She slept better last night. Has seen her appetite come back slowly. She states that she will be motivated by knowing that she is hurting herself by drinking. States that knowing that her liver is being affected is giving her a different perspective. Wants to be well for herself and her children. She is establishing closeness with a guy she has known for 15 years. He is good to her and her children. She is going to go slow as does not want to be in a rebound. As far as her daughter, she does not want her to have no consequences for her behavior.  Diagnosis:  Alcohol Dependence, Mood Disorder NOS  ADL's:  Intact  Sleep: Fair  Appetite:  Fair  Suicidal Ideation:  Plan:  denies Intent:  denies Means:  denies Homicidal Ideation:  Plan:  denies Intent:  denies Means:  denies AEB (as evidenced by):  Psychiatric Specialty Exam: Review of Systems  Constitutional: Negative.   HENT: Negative.   Eyes: Negative.   Respiratory: Negative.   Cardiovascular: Negative.   Gastrointestinal: Negative.   Genitourinary: Negative.   Musculoskeletal: Negative.   Skin: Negative.   Neurological: Negative.   Endo/Heme/Allergies: Negative.   Psychiatric/Behavioral: Positive for substance abuse. The patient is nervous/anxious.     Blood pressure 126/87, pulse 83, temperature 98.5 F (36.9 C), temperature source Oral, resp. rate 16, height 5\' 4"  (1.626 m), weight 48.081 kg (106 lb).Body mass index is 18.19 kg/(m^2).  General Appearance: Fairly Groomed  Patent attorney::  Fair  Speech:  Clear and Coherent  Volume:  Normal  Mood:  Anxious and worried, but better  Affect:  worried  Thought Process:  Coherent and Goal Directed  Orientation:  Full (Time, Place, and Person)  Thought Content:  worries, concerns  Suicidal Thoughts:  No  Homicidal  Thoughts:  No  Memory:  Immediate;   Fair Recent;   Fair Remote;   Fair  Judgement:  Fair  Insight:  Present  Psychomotor Activity:  Restlessness  Concentration:  Fair  Recall:  Fair  Akathisia:  No  Handed:  Right  AIMS (if indicated):     Assets:  Desire for Improvement  Sleep:  Number of Hours: 6.25   Current Medications: Current Facility-Administered Medications  Medication Dose Route Frequency Provider Last Rate Last Dose  . acetaminophen (TYLENOL) tablet 650 mg  650 mg Oral Q6H PRN [name], PA-C      . alum & mag hydroxide-simeth (MAALOX/MYLANTA) 200-200-20 MG/5ML suspension 30 mL  30 mL Oral Q4H PRN [name], PA-C      . chlordiazePOXIDE (LIBRIUM) capsule 25 mg  25 mg Oral Q6H PRN [name], PA-C      . chlordiazePOXIDE (LIBRIUM) capsule 25 mg  25 mg Oral BH-qamhs [name], PA-C       Followed by  . [START ON 12/20/2012] chlordiazePOXIDE (LIBRIUM) capsule 25 mg  25 mg Oral Daily [name], PA-C      . feeding supplement (RESOURCE BREEZE) liquid 1 Container  1 Container Oral TID BM [name], MD   1 Container at 12/18/12 1345  . hydrOXYzine (ATARAX/VISTARIL) tablet 25 mg  25 mg Oral Q6H PRN [name], PA-C      . loperamide (IMODIUM) capsule 2-4 mg  2-4 mg Oral PRN [name], PA-C      .  magnesium hydroxide (MILK OF MAGNESIA) suspension 30 mL  30 mL Oral Daily PRN [name], PA-C      . mirtazapine (REMERON) tablet 30 mg  30 mg Oral QHS [name], MD      . multivitamin with minerals tablet 1 tablet  1 tablet Oral Daily [name], PA-C   1 tablet at 12/18/12 0810  . nicotine (NICODERM CQ - dosed in mg/24 hours) patch 14 mg  14 mg Transdermal Q0600 [name], PA-C   14 mg at 12/18/12 4098  . ondansetron (ZOFRAN-ODT) disintegrating tablet 4 mg  4 mg Oral Q6H PRN [name], PA-C      . QUEtiapine (SEROQUEL) tablet 25 mg  25 mg Oral TID [name], MD   25 mg at 12/18/12 1208  . thiamine (B-1) injection  100 mg  100 mg Intramuscular Once [name], PA-C      . thiamine (VITAMIN B-1) tablet 100 mg  100 mg Oral Daily [name], PA-C   100 mg at 12/18/12 1191    Lab Results: No results found for this or any previous visit (from the past 48 hour(s)).  Physical Findings: AIMS: Facial and Oral Movements Muscles of Facial Expression: None, normal Lips and Perioral Area: None, normal Jaw: None, normal Tongue: None, normal,Extremity Movements Upper (arms, wrists, hands, fingers): None, normal Lower (legs, knees, ankles, toes): None, normal, Trunk Movements Neck, shoulders, hips: None, normal, Overall Severity Severity of abnormal movements (highest score from questions above): None, normal Incapacitation due to abnormal movements: None, normal Patient's awareness of abnormal movements (rate only patient's report): No Awareness, Dental Status Current problems with teeth and/or dentures?: No Does patient usually wear dentures?: No  CIWA:  CIWA-Ar Total: 1 COWS:  COWS Total Score: 2  Treatment Plan Summary: Daily contact with patient to assess and evaluate symptoms and progress in treatment Medication management  Plan:  Supportive approach/coping skills/relapse prevention            Pursue and complete the detox            Optimize treatment for co morbidites  Medical Decision Making Problem Points:  Established problem, stable/improving (1) and Review of psycho-social stressors (1) Data Points:  Review of medication regiment & side effects (2)  I certify that inpatient services furnished can reasonably be expected to improve the patient's condition.   Slyvia Lartigue A 12/18/2012, 2:28 PM
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
## 2012-12-18 NOTE — [facility] Group Notes (Signed)
[facility] LCSW Aftercare Discharge Planning Group Note   12/18/2012   Participation Quality:  Appropriated  Mood/Affect:  Appropriate  Depression Rating:  0  Anxiety Rating:  2  Thoughts of Suicide:  No Will you contract for safety?   NA  Current AVH:  No  Plan for Discharge/Comments:  Return Home and followup at [facility] for medication management, attend 90 meetings in 90 days and apply at [facility] where she was previously [employer]:  Family  Supports: Extended Family, children and several close knit friends  Felicia Perkins, Felicia Perkins

## 2012-12-19 MED ORDER — QUETIAPINE FUMARATE 25 MG PO TABS: 25.0000 mg | ORAL_TABLET | Freq: Three times a day (TID) | ORAL | Status: DC

## 2012-12-19 MED ORDER — MIRTAZAPINE 30 MG PO TABS: 30.0000 mg | ORAL_TABLET | Freq: Every day | ORAL | Status: DC

## 2012-12-19 MED ORDER — QUETIAPINE FUMARATE 25 MG PO TABS: 50.0000 mg | ORAL_TABLET | Freq: Three times a day (TID) | ORAL | Status: DC

## 2012-12-19 NOTE — [facility] Suicide Risk Assessment (Signed)
Suicide Risk Assessment  Discharge Assessment     Demographic Factors:  NA  Mental Status Per Nursing Assessment::   On Admission:  NA  Current Mental Status by Physician: In full contact with reality. There are no suicidal ideas, plans or intent. Her mood is euthymic, still worried but hopeful. Her affect is appropriate. She is committed to abstinence. She is going to follow up outpatient basis   Loss Factors: NA  Historical Factors: NA  Risk Reduction Factors:   Responsible for children under 67 years of age, Sense of responsibility to family, Employed, Living with another person, especially a relative and Positive social support  Continued Clinical Symptoms:  Alcohol/Substance Abuse/Dependencies  Cognitive Features That Contribute To Risk: None identified    Suicide Risk:  Minimal: No identifiable suicidal ideation.  Patients presenting with no risk factors but with morbid ruminations; may be classified as minimal risk based on the severity of the depressive symptoms  Discharge Diagnoses:   AXIS I:  Alcohol Dependence, Mood disorder NOS AXIS II:  Deferred AXIS III:   Past Medical History  Diagnosis Date  . PTSD (post-traumatic stress disorder)    AXIS IV:  other psychosocial or environmental problems AXIS V:  61-70 mild symptoms  Plan Of Care/Follow-up recommendations:  Activity:  as tolerated Diet:  regular Continue to follow up at [facility]/AA meetings  Is patient on multiple antipsychotic therapies at discharge:  No   Has Patient had three or more failed trials of antipsychotic monotherapy by history:  No  Recommended Plan for Multiple Antipsychotic Therapies: N/A   Felicia Perkins A 12/19/2012, 11:33 AM
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
## 2012-12-19 NOTE — Progress Notes (Signed)
Patient ID: Felicia Perkins, female   DOB: 1976/07/23, 37 y.o.   MRN: [id number] Patient discharged per physician order; patient denies SI/HI and A/V hallucinations; patient received copy of AVS and prescriptions, letter for lawyer, and copy of all AA groups after it was reviewed; patient left the unit ambulatory after signed and verbally that she received all belongings

## 2012-12-19 NOTE — Progress Notes (Signed)
[facility] Adult Case Management Discharge Plan :  Will you be returning to the same living situation after discharge: Yes,  Patient is returning to her home. At discharge, do you have transportation home?:Yes,  Patient reports family will transport her home. Do you have the ability to pay for your medications:Yes,  Patient has Medicaid  Release of information consent forms completed and in the chart;  Patient's signature needed at discharge.  Patient to Follow up at: Follow-up Information   Follow up with [facility] On 12/22/2012. (Followup at [facility] either Monday 5/12 or Tuesday 5/13 between the hours of 8AM and 3 PM; be certain to take one form of ID and discharge papers and request med management and therapy)    Contact information:   [address] [place], [place] [address] Ph [phone] Valinda Hoar [phone]      Patient denies SI/HI:  Yes, Patient is endorsing SI/HI or other thoughts of self harm.   Safety Planning and Suicide Prevention discussed:  .Reviewed with all patients during discharge planning group   Reonna Finlayson, [place] July 12/19/2012, 10:21 AM

## 2012-12-23 NOTE — Progress Notes (Signed)
Patient Discharge Instructions:  After Visit Summary (AVS):   Faxed to:  12/23/12 Discharge Summary Note:   Faxed to:  12/23/12 Psychiatric Admission Assessment Note:   Faxed to:  12/23/12 Suicide Risk Assessment - Discharge Assessment:   Faxed to:  12/23/12 Faxed/Sent to the Next Level Care provider:  12/23/12 Faxed to [facility] @ [phone]  Jerelene Redden, 12/23/2012, 4:15 PM

## 2013-01-11 ENCOUNTER — Emergency Department (HOSPITAL_COMMUNITY)
Admission: EM | Admit: 2013-01-11 | Discharge: 2013-01-12 | Disposition: A | Discharge status: Home / Self Care | Payer: Medicaid Other | Attending: Emergency Medicine | Admitting: Emergency Medicine

## 2013-01-11 ENCOUNTER — Encounter (HOSPITAL_COMMUNITY): Payer: Self-pay | Admitting: Emergency Medicine

## 2013-01-11 DIAGNOSIS — Z3202 Encounter for pregnancy test, result negative: Secondary | ICD-10-CM | POA: Insufficient documentation

## 2013-01-11 DIAGNOSIS — F4310 Post-traumatic stress disorder, unspecified: Secondary | ICD-10-CM | POA: Insufficient documentation

## 2013-01-11 DIAGNOSIS — K921 Melena: Secondary | ICD-10-CM | POA: Insufficient documentation

## 2013-01-11 DIAGNOSIS — F1010 Alcohol abuse, uncomplicated: Secondary | ICD-10-CM

## 2013-01-11 DIAGNOSIS — F17200 Nicotine dependence, unspecified, uncomplicated: Secondary | ICD-10-CM | POA: Insufficient documentation

## 2013-01-11 DIAGNOSIS — F1020 Alcohol dependence, uncomplicated: Secondary | ICD-10-CM | POA: Insufficient documentation

## 2013-01-11 DIAGNOSIS — Z79899 Other long term (current) drug therapy: Secondary | ICD-10-CM | POA: Insufficient documentation

## 2013-01-11 HISTORY — DX: Alcohol abuse, uncomplicated: F10.10

## 2013-01-11 LAB — RAPID URINE DRUG SCREEN, HOSP PERFORMED
Amphetamines: NOT DETECTED
Barbiturates: NOT DETECTED
Benzodiazepines: NOT DETECTED
Cocaine: NOT DETECTED
Opiates: NOT DETECTED
Tetrahydrocannabinol: POSITIVE — AB

## 2013-01-11 LAB — COMPREHENSIVE METABOLIC PANEL
Alkaline Phosphatase: 133 U/L — ABNORMAL HIGH (ref 39–117)
BUN: 7 mg/dL (ref 6–23)
Creatinine, Ser: 0.44 mg/dL — ABNORMAL LOW (ref 0.50–1.10)
GFR calc Af Amer: >90 mL/min (ref >90)
Glucose, Bld: 136 mg/dL — ABNORMAL HIGH (ref 70–99)
Potassium: 3.6 mEq/L (ref 3.5–5.1)
Total Bilirubin: 0.7 mg/dL (ref 0.3–1.2)
Total Protein: 8 g/dL (ref 6.0–8.3)

## 2013-01-11 LAB — CBC
HCT: 40.3 % (ref 36.0–46.0)
Hemoglobin: 14.6 g/dL (ref 12.0–15.0)
MCHC: 36.2 g/dL — ABNORMAL HIGH (ref 30.0–36.0)
MCV: 97.3 fL (ref 78.0–100.0)

## 2013-01-11 LAB — TYPE AND SCREEN: ABO/RH(D): B POS

## 2013-01-11 MED ORDER — QUETIAPINE FUMARATE 25 MG PO TABS
50.0000 mg | ORAL_TABLET | Freq: Three times a day (TID) | ORAL | Status: DC
  Administered 2013-01-12 (×2): 50 mg via ORAL
  Filled 2013-01-11 (×2): qty 2

## 2013-01-11 MED ORDER — ALUM & MAG HYDROXIDE-SIMETH 200-200-20 MG/5ML PO SUSP: 30.0000 mL | ORAL | Status: DC | PRN

## 2013-01-11 MED ORDER — LORAZEPAM 1 MG PO TABS
1.0000 mg | ORAL_TABLET | Freq: Three times a day (TID) | ORAL | Status: DC | PRN
  Administered 2013-01-11: 1 mg via ORAL
  Filled 2013-01-11: qty 1

## 2013-01-11 MED ORDER — ZOLPIDEM TARTRATE 5 MG PO TABS: 5.0000 mg | ORAL_TABLET | Freq: Every evening | ORAL | Status: DC | PRN

## 2013-01-11 MED ORDER — MIRTAZAPINE 30 MG PO TABS
30.0000 mg | ORAL_TABLET | Freq: Every day | ORAL | Status: DC
  Administered 2013-01-12: 30 mg via ORAL
  Filled 2013-01-11 (×2): qty 1

## 2013-01-11 MED ORDER — IBUPROFEN 400 MG PO TABS: 600.0000 mg | ORAL_TABLET | Freq: Three times a day (TID) | ORAL | Status: DC | PRN

## 2013-01-11 MED ORDER — ONDANSETRON HCL 4 MG PO TABS: 4.0000 mg | ORAL_TABLET | Freq: Three times a day (TID) | ORAL | Status: DC | PRN
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
## 2013-01-11 NOTE — ED Notes (Signed)
Pt in scrubs and wanded by security  

## 2013-01-11 NOTE — ED Provider Notes (Signed)
History     CSN: [id number]  Arrival date & time 01/11/13  1653   First MD Initiated Contact with Patient 01/11/13 1731      Chief Complaint  Patient presents with  . detox from [facility]    . Rectal Bleeding    (Consider location/radiation/quality/duration/timing/severity/associated sxs/prior treatment) HPI Comments: 37 y.o. female who presents to the Er w/ 2 complaints: 1) I want detox 2) evaluation for her chronic rectal bleeding.   1 -- pt states that she drinks multiple 40 ounce drinks a day, and she would like to have detox done for this. She states last drink was this morning and she had half of a 40. She denies abdominal pain, and denies vomiting up blood.   2 -- pt states she has had bright red blood in stool over the past year. She states she has hard stools. She denies hx of hemorrhoids or GI bleed, does not take blood thinners and does not chronically use NSAIDs. She states she has noticed a few areas of bright red blood mixed in with normal colored stool.   Patient is a 37 y.o. female presenting with general illness. The history is provided by the patient.  Illness Onset quality:  Gradual Timing:  Intermittent Progression:  Waxing and waning Chronicity:  Chronic Associated symptoms: no abdominal pain, no chest pain, no congestion, no cough, no diarrhea, no fatigue, no fever, no headaches, no rash, no vomiting and no wheezing     Past Medical History  Diagnosis Date  . PTSD (post-traumatic stress disorder)   . Alcohol abuse     Past Surgical History  Procedure Laterality Date  . Other surgical history      ovarian surgery  . Foot surgery    . Tubal ligation    . Abdominal hysterectomy      No family history on file.  History  Substance Use Topics  . Smoking status: Current Every Day Smoker -- 1.00 packs/day    Types: Cigarettes  . Smokeless tobacco: Never Used  . Alcohol Use: Yes     Comment: 3 - 40oz/day    OB History   Grav Para Term Preterm Abortions  TAB SAB Ect Mult Living                  Review of Systems  Constitutional: Negative for fever, chills and fatigue.  HENT: Negative for congestion, facial swelling, drooling, neck pain and dental problem.   Eyes: Negative for pain, discharge and itching.  Respiratory: Negative for cough, choking, wheezing and stridor.   Cardiovascular: Negative for chest pain.  Gastrointestinal: Positive for blood in stool. Negative for vomiting, abdominal pain and diarrhea.  Endocrine: Negative for cold intolerance and heat intolerance.  Genitourinary: Negative for vaginal discharge, difficulty urinating and vaginal pain.  Skin: Negative for pallor and rash.  Neurological: Negative for dizziness, light-headedness and headaches.  Psychiatric/Behavioral: Negative for behavioral problems and agitation.    Allergies  Review of patient's allergies indicates no known allergies.  Home Medications   Current Outpatient Rx  Name  Route  Sig  Dispense  Refill  . feeding supplement (BOOST HIGH PROTEIN) LIQD   Oral   Take 1 Container by mouth 2 (two) times daily between meals.         . mirtazapine (REMERON) 30 MG tablet   Oral   Take 30 mg by mouth at bedtime.         . Multiple Vitamins-Minerals (COMPLETE WOMENS) TABS   Oral  Take 1 tablet by mouth daily.         . QUEtiapine (SEROQUEL) 25 MG tablet   Oral   Take 50 mg by mouth 3 (three) times daily.           BP 148/94  Pulse 86  Temp(Src) 98.9 F (37.2 C) (Oral)  Resp 20  SpO2 99%  Physical Exam  Constitutional: She is oriented to person, place, and time. She appears well-developed. No distress.  HENT:  Head: Normocephalic and atraumatic.  Eyes: Pupils are equal, round, and reactive to light. Right eye exhibits no discharge. Left eye exhibits no discharge.  Neck: Neck supple. No tracheal deviation present.  Cardiovascular: Normal rate.  Exam reveals no gallop and no friction rub.   Pulmonary/Chest: No stridor. No respiratory  distress. She has no wheezes.  Abdominal: Soft. She exhibits no distension. There is no tenderness. There is no rebound.  Genitourinary:  Normal colored stool on exam, skin tags, but no external hemorrhoid.   Musculoskeletal: She exhibits no edema and no tenderness.  Neurological: She is alert and oriented to person, place, and time.  Skin: Skin is warm. She is not diaphoretic.    ED Course  Procedures (including critical care time)  Labs Reviewed  CBC - Abnormal; Notable for the following:    MCH 35.3 (*)    MCHC 36.2 (*)    All other components within normal limits  COMPREHENSIVE METABOLIC PANEL - Abnormal; Notable for the following:    Sodium 133 (*)    Chloride 95 (*)    Glucose, Bld 136 (*)    Creatinine, Ser 0.44 (*)    AST 51 (*)    Alkaline Phosphatase 133 (*)    All other components within normal limits  SALICYLATE LEVEL - Abnormal; Notable for the following:    Salicylate Lvl <2.0 (*)    All other components within normal limits  URINE RAPID DRUG SCREEN (HOSP PERFORMED) - Abnormal; Notable for the following:    Tetrahydrocannabinol POSITIVE (*)    All other components within normal limits  OCCULT BLOOD, POC DEVICE - Abnormal; Notable for the following:    Fecal Occult Bld POSITIVE (*)    All other components within normal limits  ACETAMINOPHEN LEVEL  ETHANOL  POCT PREGNANCY, URINE  TYPE AND SCREEN  ABO/RH   No results found.   MDM  Pt's hb is stable from prior -- and her rectal bleeding is chronic. Suspect coming from pt having hard stools / internal hemorrhoids. She does not have increase in amount of blood recently, and states the main reason she came is for alcohol detox. She is not having DTs currently and does not have physical exam findings consistent w/ acute withdrawal symptoms. Denies SI and HI currently.   ACT team has evaluated pt. They state that pt's alcohol use was restarted a few days ago, and pt has not consistently been drinking. They suspect  that pt will be able to be d/c tomorrow morning.   1. Blood in stool   2. Alcohol abuse             Bernadene Person, MD 01/13/13 1207
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
## 2013-01-11 NOTE — ED Notes (Addendum)
Requesting detox from [facility].  Discharged from [facility] on 5/9 for same.  Denies SI/ HI.  Last [facility]oh this morning.  Also reports bright red rectal bleeding x 1 year.

## 2013-01-12 NOTE — ED Notes (Signed)
Patient report given to Lawson Fiscal, [place]

## 2013-05-01 ENCOUNTER — Emergency Department (HOSPITAL_COMMUNITY)
Admission: EM | Admit: 2013-05-01 | Discharge: 2013-05-01 | Discharge status: Absent without leave | Payer: Self-pay | Source: Home / Self Care

## 2013-10-23 ENCOUNTER — Emergency Department (HOSPITAL_COMMUNITY): Payer: Medicaid Other

## 2013-10-23 ENCOUNTER — Emergency Department (HOSPITAL_COMMUNITY)
Admission: EM | Admit: 2013-10-23 | Discharge: 2013-10-23 | Disposition: A | Discharge status: Home / Self Care | Payer: Medicaid Other | Attending: Emergency Medicine | Admitting: Emergency Medicine

## 2013-10-23 ENCOUNTER — Encounter (HOSPITAL_COMMUNITY): Payer: Self-pay | Admitting: Emergency Medicine

## 2013-10-23 DIAGNOSIS — F4310 Post-traumatic stress disorder, unspecified: Secondary | ICD-10-CM | POA: Insufficient documentation

## 2013-10-23 DIAGNOSIS — W19XXXA Unspecified fall, initial encounter: Secondary | ICD-10-CM

## 2013-10-23 DIAGNOSIS — Y9301 Activity, walking, marching and hiking: Secondary | ICD-10-CM | POA: Insufficient documentation

## 2013-10-23 DIAGNOSIS — F319 Bipolar disorder, unspecified: Secondary | ICD-10-CM | POA: Insufficient documentation

## 2013-10-23 DIAGNOSIS — M25469 Effusion, unspecified knee: Secondary | ICD-10-CM | POA: Insufficient documentation

## 2013-10-23 DIAGNOSIS — S0990XA Unspecified injury of head, initial encounter: Secondary | ICD-10-CM | POA: Insufficient documentation

## 2013-10-23 DIAGNOSIS — S8990XA Unspecified injury of unspecified lower leg, initial encounter: Secondary | ICD-10-CM | POA: Insufficient documentation

## 2013-10-23 DIAGNOSIS — F17200 Nicotine dependence, unspecified, uncomplicated: Secondary | ICD-10-CM | POA: Insufficient documentation

## 2013-10-23 DIAGNOSIS — Y929 Unspecified place or not applicable: Secondary | ICD-10-CM | POA: Insufficient documentation

## 2013-10-23 DIAGNOSIS — Z79899 Other long term (current) drug therapy: Secondary | ICD-10-CM | POA: Insufficient documentation

## 2013-10-23 DIAGNOSIS — S99919A Unspecified injury of unspecified ankle, initial encounter: Principal | ICD-10-CM

## 2013-10-23 DIAGNOSIS — W1809XA Striking against other object with subsequent fall, initial encounter: Secondary | ICD-10-CM | POA: Insufficient documentation

## 2013-10-23 DIAGNOSIS — S99929A Unspecified injury of unspecified foot, initial encounter: Principal | ICD-10-CM

## 2013-10-23 HISTORY — DX: Bipolar disorder, unspecified: F31.9

## 2013-10-23 MED ORDER — OXYCODONE-ACETAMINOPHEN 5-325 MG PO TABS: 2.0000 | ORAL_TABLET | Freq: Four times a day (QID) | ORAL | Status: DC | PRN

## 2013-10-23 MED ORDER — IBUPROFEN 400 MG PO TABS
800.0000 mg | ORAL_TABLET | ORAL | Status: AC
  Administered 2013-10-23: 800 mg via ORAL
  Filled 2013-10-23: qty 2

## 2013-10-23 NOTE — ED Provider Notes (Signed)
CSN: [id number]     Arrival date & time 10/23/13  1403 History  This chart was scribed for non-physician practitioner Montine Circle, PA-C working with Osvaldo Shipper, MD by Eston Mould, ED Scribe. This patient was seen in room TR08C/TR08C and the patient's care was started at 5:26 PM .   Chief Complaint  Patient presents with  . Knee Injury  . Fall  . Headache   Patient is a 38 y.o. female presenting with headaches. The history is provided by the patient. No language interpreter was used.  Headache  HPI Comments: Felicia Perkins is a 38 y.o. female who presents to the Emergency Department complaining of fall and now has a R knee injury and HA that occurred last night. Pt states her daughter tripped her, she then fell, hit her R knee on edge of couch. She states she then fell to the floor due to having R knee pain then reports hitting head on floor. She now c/o HA. Pt states she was in pain last night but states her pain worsened this morning. Pt states she has not been able to walk due to R knee pain. Pt denies LOC. Pt denies emesis.   Past Medical History  Diagnosis Date  . PTSD (post-traumatic stress disorder)   . Alcohol abuse   . Bipolar 1 disorder    Past Surgical History  Procedure Laterality Date  . Other surgical history      ovarian surgery  . Foot surgery    . Tubal ligation    . Abdominal hysterectomy     History reviewed. No pertinent family history. History  Substance Use Topics  . Smoking status: Current Every Day Smoker -- 1.00 packs/day    Types: Cigarettes  . Smokeless tobacco: Never Used  . Alcohol Use: No     Comment: 4 months    OB History   Grav Para Term Preterm Abortions TAB SAB Ect Mult Living                 Review of Systems  Musculoskeletal: Positive for gait problem.  Neurological: Positive for headaches.    Allergies  Review of patient's allergies indicates no known allergies.  Home Medications   Current  Outpatient Rx  Name  Route  Sig  Dispense  Refill  . feeding supplement (BOOST HIGH PROTEIN) LIQD   Oral   Take 1 Container by mouth 2 (two) times daily between meals.         . mirtazapine (REMERON) 30 MG tablet   Oral   Take 30 mg by mouth at bedtime.         . Multiple Vitamins-Minerals (COMPLETE WOMENS) TABS   Oral   Take 1 tablet by mouth daily.         . QUEtiapine (SEROQUEL) 25 MG tablet   Oral   Take 50 mg by mouth 3 (three) times daily.          BP 130/68  Pulse 75  Temp(Src) 98.2 F (36.8 C) (Oral)  Resp 20  Ht 5\' 4"  (1.626 m)  Wt 120 lb (54.432 kg)  BMI 20.59 kg/m2  SpO2 98%  Physical Exam  Nursing note and vitals reviewed. Constitutional: She is oriented to person, place, and time. She appears well-developed and well-nourished. No distress.  HENT:  Head: Normocephalic and atraumatic.  L forehead, above eyebrow, moderately tender to palption. No bony abnormality or deformity. Mild swelling and slight bruising.   Eyes: EOM are normal.  No evidence of entrapment.   Neck: Neck supple. No tracheal deviation present.  Cardiovascular: Normal rate and intact distal pulses.   Pulmonary/Chest: Effort normal. No respiratory distress.  Musculoskeletal: Normal range of motion.  R knee remarkable for joint effusion. Moderately tender to palpation diffusely. ROM reduced secondary to pain.  Neurological: She is alert and oriented to person, place, and time.  Sensation intact  Skin: Skin is warm and dry.  Psychiatric: She has a normal mood and affect. Her behavior is normal.    ED Course  Procedures  DIAGNOSTIC STUDIES: Oxygen Saturation is 98% on RA, normal by my interpretation.    COORDINATION OF CARE: 5:33 PM-Discussed treatment plan which includes discuss radiology findings and discharge pt with knee immobilizer, crutches and Percocet  . Advised pt to F/U with Orthopedic Doctor and take Ibuprofen. Pt agreed to plan.   Labs Review Labs Reviewed - No data  to display Imaging Review Dg Knee Complete 4 Views Right  10/23/2013   CLINICAL DATA:  Right knee pain following injury  EXAM: RIGHT KNEE - COMPLETE 4+ VIEW  COMPARISON:  None.  FINDINGS: No acute fracture dislocation is noted. A small joint effusion is seen. No other soft tissue abnormality is noted.  IMPRESSION: Small joint effusion.  No acute bony abnormality is noted.   Electronically Signed   By: Inez Catalina M.D.   On: 10/23/2013 16:07     EKG Interpretation None     MDM   Final diagnoses:  Fall  Knee effusion    Patient with right-sided knee effusion following a mechanical fall. Will give the patient a knee immobilizer and crutches. Recommend orthopedic followup. Patient also hit her head after falling, but she does not have any signs of an emergent head injury. Head is cleared with [place] head CT rules. Patient understands and agrees with plan. She is stable and ready for discharge.  I personally performed the services described in this documentation, which was scribed in my presence. The recorded information has been reviewed and is accurate.     Montine Circle, PA-C 10/23/13 1754

## 2013-10-23 NOTE — Progress Notes (Signed)
Orthopedic Tech Progress Note Patient Details:  Felicia Perkins 08/12/1976 [id number]  Ortho Devices Type of Ortho Device: Knee Immobilizer;Crutches Ortho Device/Splint Location: RLE Ortho Device/Splint Interventions: Ordered;Application   Braulio Bosch 10/23/2013, 5:56 PM

## 2013-10-23 NOTE — ED Notes (Signed)
Pt reports accidentally running into her child last night around 10pm and falling on right knee and hitting her forehead on corner of bed. States that she took 1 vicodin last night, but nothing today for the pain. C/o right knee pain and headache.

## 2013-10-23 NOTE — Discharge Instructions (Signed)

## 2013-10-23 NOTE — ED Notes (Signed)
Pt states she fell last night and her right knee hit the floor and her head hit the frame of the couch. Denies any LOC. C/o pain to knee and head.

## 2013-10-23 NOTE — ED Provider Notes (Signed)
Medical screening examination/treatment/procedure(s) were performed by non-physician practitioner and as supervising physician I was immediately available for consultation/collaboration.   EKG Interpretation None        Osvaldo Shipper, MD 10/23/13 1816

## 2014-01-25 ENCOUNTER — Emergency Department (HOSPITAL_COMMUNITY): Payer: Medicaid Other

## 2014-01-25 ENCOUNTER — Emergency Department (HOSPITAL_COMMUNITY)
Admission: EM | Admit: 2014-01-25 | Discharge: 2014-01-25 | Disposition: A | Discharge status: Home / Self Care | Payer: Medicaid Other | Attending: Emergency Medicine | Admitting: Emergency Medicine

## 2014-01-25 ENCOUNTER — Encounter (HOSPITAL_COMMUNITY): Payer: Self-pay | Admitting: Emergency Medicine

## 2014-01-25 DIAGNOSIS — J020 Streptococcal pharyngitis: Secondary | ICD-10-CM | POA: Insufficient documentation

## 2014-01-25 DIAGNOSIS — F319 Bipolar disorder, unspecified: Secondary | ICD-10-CM | POA: Insufficient documentation

## 2014-01-25 DIAGNOSIS — F4310 Post-traumatic stress disorder, unspecified: Secondary | ICD-10-CM | POA: Insufficient documentation

## 2014-01-25 DIAGNOSIS — R0789 Other chest pain: Secondary | ICD-10-CM | POA: Insufficient documentation

## 2014-01-25 DIAGNOSIS — Z79899 Other long term (current) drug therapy: Secondary | ICD-10-CM | POA: Insufficient documentation

## 2014-01-25 DIAGNOSIS — R0602 Shortness of breath: Secondary | ICD-10-CM | POA: Insufficient documentation

## 2014-01-25 DIAGNOSIS — F17200 Nicotine dependence, unspecified, uncomplicated: Secondary | ICD-10-CM | POA: Insufficient documentation

## 2014-01-25 LAB — BASIC METABOLIC PANEL
BUN: 5 mg/dL — ABNORMAL LOW (ref 6–23)
CO2: 22 mEq/L (ref 19–32)
Calcium: 10.4 mg/dL (ref 8.4–10.5)
Chloride: 95 mEq/L — ABNORMAL LOW (ref 96–112)
Creatinine, Ser: 0.44 mg/dL — ABNORMAL LOW (ref 0.50–1.10)
GFR calc Af Amer: >90 mL/min (ref >90)
GLUCOSE: 89 mg/dL (ref 70–99)
POTASSIUM: 3.7 meq/L (ref 3.7–5.3)
SODIUM: 138 meq/L (ref 137–147)

## 2014-01-25 LAB — CBC
HCT: 44.5 % (ref 36.0–46.0)
HEMOGLOBIN: 15.7 g/dL — AB (ref 12.0–15.0)
MCH: 35.3 pg — ABNORMAL HIGH (ref 26.0–34.0)
MCHC: 35.3 g/dL (ref 30.0–36.0)
MCV: 100 fL (ref 78.0–100.0)
PLATELETS: 181 10*3/uL (ref 150–400)
RBC: 4.45 MIL/uL (ref 3.87–5.11)
RDW: 12.6 % (ref 11.5–15.5)
WBC: 11.9 10*3/uL — ABNORMAL HIGH (ref 4.0–10.5)

## 2014-01-25 LAB — D-DIMER, QUANTITATIVE (NOT AT ARMC): D DIMER QUANT: 0.71 ug{FEU}/mL — AB (ref 0.00–0.48)

## 2014-01-25 LAB — PRO B NATRIURETIC PEPTIDE: PRO B NATRI PEPTIDE: 41.4 pg/mL (ref 0–125)

## 2014-01-25 LAB — RAPID STREP SCREEN (MED CTR MEBANE ONLY): STREPTOCOCCUS, GROUP A SCREEN (DIRECT): POSITIVE — AB

## 2014-01-25 MED ORDER — AZITHROMYCIN 250 MG PO TABS: 250.0000 mg | ORAL_TABLET | Freq: Every day | ORAL | Status: DC

## 2014-01-25 MED ORDER — ALBUTEROL SULFATE (2.5 MG/3ML) 0.083% IN NEBU
5.0000 mg | INHALATION_SOLUTION | Freq: Once | RESPIRATORY_TRACT | Status: AC
Start: 2014-01-25 — End: 2014-01-25
  Administered 2014-01-25: 5 mg via RESPIRATORY_TRACT
  Filled 2014-01-25: qty 6

## 2014-01-25 MED ORDER — IPRATROPIUM BROMIDE 0.02 % IN SOLN
0.5000 mg | Freq: Once | RESPIRATORY_TRACT | Status: AC
  Administered 2014-01-25: 0.5 mg via RESPIRATORY_TRACT
  Filled 2014-01-25: qty 2.5

## 2014-01-25 MED ORDER — PENICILLIN G BENZATHINE 1200000 UNIT/2ML IM SUSP
1.2000 10*6.[IU] | Freq: Once | INTRAMUSCULAR | Status: AC
  Administered 2014-01-25: 1.2 10*6.[IU] via INTRAMUSCULAR
  Filled 2014-01-25: qty 2

## 2014-01-25 MED ORDER — GUAIFENESIN-CODEINE 100-10 MG/5ML PO SOLN: 5.0000 mL | Freq: Three times a day (TID) | ORAL | Status: DC | PRN

## 2014-01-25 MED ORDER — IOHEXOL 350 MG/ML SOLN
100.0000 mL | Freq: Once | INTRAVENOUS | Status: AC | PRN
  Administered 2014-01-25: 100 mL via INTRAVENOUS

## 2014-01-25 MED ORDER — PANTOPRAZOLE SODIUM 40 MG IV SOLR
40.0000 mg | Freq: Once | INTRAVENOUS | Status: AC
  Administered 2014-01-25: 40 mg via INTRAVENOUS
  Filled 2014-01-25: qty 40

## 2014-01-25 MED ORDER — GI COCKTAIL ~~LOC~~
30.0000 mL | Freq: Once | ORAL | Status: AC
  Administered 2014-01-25: 30 mL via ORAL
  Filled 2014-01-25: qty 30

## 2014-01-25 MED ORDER — SODIUM CHLORIDE 0.9 % IV BOLUS (SEPSIS)
1000.0000 mL | Freq: Once | INTRAVENOUS | Status: AC
  Administered 2014-01-25: 1000 mL via INTRAVENOUS

## 2014-01-25 NOTE — ED Provider Notes (Signed)
CSN: [id number]     Arrival date & time 01/25/14  1123 History   First MD Initiated Contact with Patient 01/25/14 1619     Chief Complaint  Patient presents with  . Chest Pain  . Cough     (Consider location/radiation/quality/duration/timing/severity/associated sxs/prior Treatment) HPI  Patient with PMH of PTSD, alcohol abuse and bipolar disorder presents to the ER with complaints of shortness of breath, sore throats, nasal congestion, chest tenderness, pain with swallowing. She says that this happened this morning at her son's graduation daily. She does not know what caused it. She denies anything makes it better or worse. She denies having any fevers, nausea, vomiting, diarrhea. She denies having any weakness or possible history of asthma, blood clots, heart disease. She denies feeling anxious or having sensation of something stuck in her throat. Her vital signs here are stable her oxygen saturation is 100% on room air. She is not tachycardic and has normal blood pressure. Denies any changes in medications recently.  Past Medical History  Diagnosis Date  . PTSD (post-traumatic stress disorder)   . Alcohol abuse   . Bipolar 1 disorder    Past Surgical History  Procedure Laterality Date  . Other surgical history      ovarian surgery  . Foot surgery    . Tubal ligation    . Abdominal hysterectomy     History reviewed. No pertinent family history. History  Substance Use Topics  . Smoking status: Current Every Day Smoker -- 1.00 packs/day    Types: Cigarettes  . Smokeless tobacco: Never Used  . Alcohol Use: No     Comment: 4 months    OB History   Grav Para Term Preterm Abortions TAB SAB Ect Mult Living                 Review of Systems   Review of Systems  Gen: no weight loss, fevers, chills, night sweats  Eyes: no discharge or drainage, no occular pain or visual changes  Nose: no epistaxis or rhinorrhea  Mouth: no dental pain, + sore throat  Neck: no neck pain    Lungs:No wheezing, or hemoptysis + coughing and SOB CV: no chest pain, palpitations, dependent edema or orthopnea  Abd: no abdominal pain, nausea, vomiting, diarrhea GU: no dysuria or gross hematuria  MSK:  No muscle weakness or pain Neuro: no headache, no focal neurologic deficits  Skin: no rash or wounds Psyche: no complaints    Allergies  Review of patient's allergies indicates no known allergies.  Home Medications   Prior to Admission medications   Medication Sig Start Date End Date Taking? Authorizing Provider  feeding supplement (BOOST HIGH PROTEIN) LIQD Take 1 Container by mouth 2 (two) times daily between meals.   Yes Historical Provider, MD  mirtazapine (REMERON) 30 MG tablet Take 30 mg by mouth at bedtime.   Yes Historical Provider, MD  Multiple Vitamins-Minerals (COMPLETE WOMENS) TABS Take 1 tablet by mouth daily.   Yes Historical Provider, MD  oxyCODONE-acetaminophen (PERCOCET/ROXICET) 5-325 MG per tablet Take 2 tablets by mouth every 6 (six) hours as needed for severe pain. 10/23/13  Yes Montine Circle, PA-C  QUEtiapine (SEROQUEL) 25 MG tablet Take 50 mg by mouth 3 (three) times daily.   Yes Historical Provider, MD  azithromycin (ZITHROMAX) 250 MG tablet Take 1 tablet (250 mg total) by mouth daily. Take first 2 tablets together, then 1 every day until finished. 01/25/14   Mickala Laton Marilu Favre, PA-C  guaiFENesin-codeine 100-10 MG/5ML  syrup Take 5 mLs by mouth 3 (three) times daily as needed for cough. 01/25/14   Laquinn Shippy Marilu Favre, PA-C   BP 126/79  Pulse 80  Temp(Src) 97.9 F (36.6 C) (Oral)  Resp 18  Ht 5\' 4"  (1.626 m)  Wt 125 lb (56.7 kg)  BMI 21.45 kg/m2  SpO2 100% Physical Exam  Nursing note and vitals reviewed. Constitutional: She is oriented to person, place, and time. She appears well-developed and well-nourished. No distress.  HENT:  Head: Normocephalic and atraumatic.  Right Ear: Tympanic membrane, external ear and ear canal normal.  Left Ear: Tympanic  membrane, external ear and ear canal normal.  Nose: Nose normal. No rhinorrhea. Right sinus exhibits no maxillary sinus tenderness and no frontal sinus tenderness. Left sinus exhibits no maxillary sinus tenderness and no frontal sinus tenderness.  Mouth/Throat: Uvula is midline and mucous membranes are normal. No trismus in the jaw. Normal dentition. No dental abscesses or uvula swelling. Oropharyngeal exudate and posterior oropharyngeal edema present. No posterior oropharyngeal erythema or tonsillar abscesses.  No submental edema, tongue not elevated, no trismus. No impending airway obstruction; Pt able to speak full sentences, swallow intact, no drooling, stridor, or tonsillar/uvula displacement. No palatal petechia  Eyes: Conjunctivae are normal. Pupils are equal, round, and reactive to light.  Neck: Trachea normal, normal range of motion and full passive range of motion without pain. Neck supple. No rigidity. Normal range of motion present. No Brudzinski's sign noted.  Flexion and extension of neck without pain or difficulty. Able to breath without difficulty in extension.  Cardiovascular: Normal rate and regular rhythm.   Pulmonary/Chest: Effort normal and breath sounds normal. No stridor. No respiratory distress. She has no wheezes.    Increased effort of breathing  Abdominal: Soft. There is no tenderness.  No obvious evidence of splenomegaly. Non ttp.   Musculoskeletal: Normal range of motion.  Lymphadenopathy:       Head (right side): No preauricular and no posterior auricular adenopathy present.       Head (left side): No preauricular and no posterior auricular adenopathy present.    She has cervical adenopathy.  Neurological: She is alert and oriented to person, place, and time.  Skin: Skin is warm and dry. No rash noted. She is not diaphoretic.  Psychiatric: She has a normal mood and affect.    ED Course  Procedures (including critical care time) Labs Review Labs Reviewed    RAPID STREP SCREEN - Abnormal; Notable for the following:    Streptococcus, Group A Screen (Direct) POSITIVE (*)    All other components within normal limits  CBC - Abnormal; Notable for the following:    WBC 11.9 (*)    Hemoglobin 15.7 (*)    MCH 35.3 (*)    All other components within normal limits  BASIC METABOLIC PANEL - Abnormal; Notable for the following:    Chloride 95 (*)    BUN 5 (*)    Creatinine, Ser 0.44 (*)    All other components within normal limits  D-DIMER, QUANTITATIVE - Abnormal; Notable for the following:    D-Dimer, Quant 0.71 (*)    All other components within normal limits  PRO B NATRIURETIC PEPTIDE    Imaging Review Dg Chest 2 View  01/25/2014   CLINICAL DATA:  Chest pain and cough; fever  EXAM: CHEST  2 VIEW  COMPARISON:  Dec 13, 2012  FINDINGS: Lungs are slightly hyperexpanded clear. Heart size and pulmonary vascularity are normal. No adenopathy. No bone lesions.  IMPRESSION: No edema or consolidation.  Lungs slightly hyperexpanded.   Electronically Signed   By: Lowella Grip M.D.   On: 01/25/2014 12:34   Ct Angio Chest Pe W/cm &/or Wo Cm  01/25/2014   CLINICAL DATA:  Elevated D-dimer, short of breath  EXAM: CT ANGIOGRAPHY CHEST WITH CONTRAST  TECHNIQUE: Multidetector CT imaging of the chest was performed using the standard protocol during bolus administration of intravenous contrast. Multiplanar CT image reconstructions and MIPs were obtained to evaluate the vascular anatomy.  CONTRAST:  126mL OMNIPAQUE IOHEXOL 350 MG/ML SOLN  COMPARISON:  None.  FINDINGS: There are no filling defects within the pulmonary arteries to suggest acute pulmonary embolism. No acute findings aorta great vessels. No pericardial fluid. Esophagus is normal.  Review of the lung parenchyma demonstrates no pneumothorax or pleural fluid. No airspace disease.  No axillary supraclavicular adenopathy.  No mediastinal adenopathy.  Limited view of the upper abdomen is unremarkable. Limited view  of the skeleton is unremarkable.  Review of the MIP images confirms the above findings.  IMPRESSION: No evidence of acute pulmonary embolism.  Normal CT chest.   Electronically Signed   By: Suzy Bouchard M.D.   On: 01/25/2014 18:39     EKG Interpretation None      MDM   Final diagnoses:  Strep pharyngitis    Patient given  An albuterol atrovent breathing treatment to evaluate for any improvement. Also given GI cocktail and protonix. She denies having relief and continues to have increased effort of breathing and some pain. Reports "busted her knee up a week ago".  She has a positive d-dimer. Will get CT angio of the chest and r/o PE.  Patient did not have PE on ct angio of the chest but her strep screen came back negative. She will be given and IM shot of Penicillin prior to discharge. Will rx Azithromycin and guaif with codeine, advised to get lots of rest and fluids.  38 y.o.Montie M Levings's evaluation in the Emergency Department is complete. It has been determined that no acute conditions requiring further emergency intervention are present at this time. The patient/guardian have been advised of the diagnosis and plan. We have discussed signs and symptoms that warrant return to the ED, such as changes or worsening in symptoms.  Vital signs are stable at discharge. Filed Vitals:   01/25/14 1800  BP: 126/79  Pulse: 80  Temp:   Resp: 18    Patient/guardian has voiced understanding and agreed to follow-up with the PCP or specialist.   Linus Mako, PA-C 01/25/14 1917

## 2014-01-25 NOTE — ED Notes (Signed)
Pt reports burning in chest, congestion and sob, pt sts hard to swallow and tender in throat, pt reports cold in her chest.

## 2014-01-25 NOTE — ED Notes (Signed)
Pt A&OX4, wheeled out of ED in wheelchair, NAD.

## 2014-01-25 NOTE — Discharge Instructions (Signed)
Strep Throat  Strep throat is an infection of the throat caused by a bacteria named Streptococcus pyogenes. Your caregiver may call the infection streptococcal "tonsillitis" or "pharyngitis" depending on whether there are signs of inflammation in the tonsils or back of the throat. Strep throat is most common in children aged 39 15 years during the cold months of the year, but it can occur in people of any age during any season. This infection is spread from person to person (contagious) through coughing, sneezing, or other close contact.  SYMPTOMS   · Fever or chills.  · Painful, swollen, red tonsils or throat.  · Pain or difficulty when swallowing.  · White or yellow spots on the tonsils or throat.  · Swollen, tender lymph nodes or "glands" of the neck or under the jaw.  · Red rash all over the body (rare).  DIAGNOSIS   Many different infections can cause the same symptoms. A test must be done to confirm the diagnosis so the right treatment can be given. A "rapid strep test" can help your caregiver make the diagnosis in a few minutes. If this test is not available, a light swab of the infected area can be used for a throat culture test. If a throat culture test is done, results are usually available in a day or two.  TREATMENT   Strep throat is treated with antibiotic medicine.  HOME CARE INSTRUCTIONS   · Gargle with 1 tsp of salt in 1 cup of warm water, 3 4 times per day or as needed for comfort.  · Family members who also have a sore throat or fever should be tested for strep throat and treated with antibiotics if they have the strep infection.  · Make sure everyone in your household washes their hands well.  · Do not share food, drinking cups, or personal items that could cause the infection to spread to others.  · You may need to eat a soft food diet until your sore throat gets better.  · Drink enough water and fluids to keep your urine clear or pale yellow. This will help prevent dehydration.  · Get plenty of  rest.  · Stay home from school, daycare, or work until you have been on antibiotics for 24 hours.  · Only take over-the-counter or prescription medicines for pain, discomfort, or fever as directed by your caregiver.  · If antibiotics are prescribed, take them as directed. Finish them even if you start to feel better.  SEEK MEDICAL CARE IF:   · The glands in your neck continue to enlarge.  · You develop a rash, cough, or earache.  · You cough up green, yellow-brown, or bloody sputum.  · You have pain or discomfort not controlled by medicines.  · Your problems seem to be getting worse rather than better.  SEEK IMMEDIATE MEDICAL CARE IF:   · You develop any new symptoms such as vomiting, severe headache, stiff or painful neck, chest pain, shortness of breath, or trouble swallowing.  · You develop severe throat pain, drooling, or changes in your voice.  · You develop swelling of the neck, or the skin on the neck becomes red and tender.  · You have a fever.  · You develop signs of dehydration, such as fatigue, dry mouth, and decreased urination.  · You become increasingly sleepy, or you cannot wake up completely.  Document Released: 07/27/2000 Document Revised: 07/16/2012 Document Reviewed: 09/28/2010  [employer]® Patient Information ©2014 [employer].

## 2014-01-26 NOTE — ED Provider Notes (Signed)
Medical screening examination/treatment/procedure(s) were performed by non-physician practitioner and as supervising physician I was immediately available for consultation/collaboration.   EKG Interpretation None       Richarda Blade, MD 01/26/14 1222

## 2014-12-15 ENCOUNTER — Emergency Department (HOSPITAL_COMMUNITY): Payer: Self-pay

## 2014-12-15 ENCOUNTER — Observation Stay (HOSPITAL_COMMUNITY)
Admission: EM | Admit: 2014-12-15 | Discharge: 2014-12-16 | Disposition: A | Discharge status: Home / Self Care | Payer: Self-pay | Attending: Internal Medicine | Admitting: Internal Medicine

## 2014-12-15 ENCOUNTER — Encounter (HOSPITAL_COMMUNITY): Payer: Self-pay | Admitting: *Deleted

## 2014-12-15 DIAGNOSIS — R1013 Epigastric pain: Principal | ICD-10-CM

## 2014-12-15 DIAGNOSIS — F17210 Nicotine dependence, cigarettes, uncomplicated: Secondary | ICD-10-CM | POA: Insufficient documentation

## 2014-12-15 DIAGNOSIS — D696 Thrombocytopenia, unspecified: Secondary | ICD-10-CM | POA: Insufficient documentation

## 2014-12-15 DIAGNOSIS — K760 Fatty (change of) liver, not elsewhere classified: Secondary | ICD-10-CM | POA: Insufficient documentation

## 2014-12-15 DIAGNOSIS — F4310 Post-traumatic stress disorder, unspecified: Secondary | ICD-10-CM | POA: Insufficient documentation

## 2014-12-15 DIAGNOSIS — R9431 Abnormal electrocardiogram [ECG] [EKG]: Secondary | ICD-10-CM | POA: Insufficient documentation

## 2014-12-15 DIAGNOSIS — R7989 Other specified abnormal findings of blood chemistry: Secondary | ICD-10-CM | POA: Insufficient documentation

## 2014-12-15 DIAGNOSIS — F1010 Alcohol abuse, uncomplicated: Secondary | ICD-10-CM | POA: Insufficient documentation

## 2014-12-15 DIAGNOSIS — R079 Chest pain, unspecified: Secondary | ICD-10-CM | POA: Diagnosis present

## 2014-12-15 DIAGNOSIS — Z9851 Tubal ligation status: Secondary | ICD-10-CM | POA: Insufficient documentation

## 2014-12-15 DIAGNOSIS — F319 Bipolar disorder, unspecified: Secondary | ICD-10-CM | POA: Insufficient documentation

## 2014-12-15 DIAGNOSIS — F1910 Other psychoactive substance abuse, uncomplicated: Secondary | ICD-10-CM | POA: Insufficient documentation

## 2014-12-15 HISTORY — DX: Chest pain, unspecified: R07.9

## 2014-12-15 LAB — BASIC METABOLIC PANEL
Anion gap: 16 — ABNORMAL HIGH (ref 5–15)
CO2: 22 mmol/L (ref 22–32)
Calcium: 10 mg/dL (ref 8.9–10.3)
Chloride: 96 mmol/L — ABNORMAL LOW (ref 101–111)
Creatinine, Ser: 0.51 mg/dL (ref 0.44–1.00)
GFR calc Af Amer: >60 mL/min (ref >60)
GFR calc non Af Amer: >60 mL/min (ref >60)
Glucose, Bld: 113 mg/dL — ABNORMAL HIGH (ref 70–99)
POTASSIUM: 3.6 mmol/L (ref 3.5–5.1)
Sodium: 134 mmol/L — ABNORMAL LOW (ref 135–145)

## 2014-12-15 LAB — CBC
HCT: 43.2 % (ref 36.0–46.0)
Hemoglobin: 15 g/dL (ref 12.0–15.0)
MCH: 34.2 pg — ABNORMAL HIGH (ref 26.0–34.0)
MCHC: 34.7 g/dL (ref 30.0–36.0)
MCV: 98.4 fL (ref 78.0–100.0)
PLATELETS: 122 10*3/uL — AB (ref 150–400)
RBC: 4.39 MIL/uL (ref 3.87–5.11)
RDW: 12.1 % (ref 11.5–15.5)
WBC: 5.9 10*3/uL (ref 4.0–10.5)

## 2014-12-15 LAB — HEPATIC FUNCTION PANEL
ALT: 106 U/L — ABNORMAL HIGH (ref 14–54)
AST: 225 U/L — AB (ref 15–41)
Albumin: 4.3 g/dL (ref 3.5–5.0)
Alkaline Phosphatase: 125 U/L (ref 38–126)
BILIRUBIN INDIRECT: 1.1 mg/dL — AB (ref 0.3–0.9)
Bilirubin, Direct: 0.3 mg/dL (ref 0.1–0.5)
TOTAL PROTEIN: 7.7 g/dL (ref 6.5–8.1)
Total Bilirubin: 1.4 mg/dL — ABNORMAL HIGH (ref 0.3–1.2)

## 2014-12-15 LAB — LIPASE, BLOOD: Lipase: 42 U/L (ref 22–51)

## 2014-12-15 LAB — I-STAT TROPONIN, ED: Troponin i, poc: 0 ng/mL (ref 0.00–0.08)

## 2014-12-15 LAB — TROPONIN I: Troponin I: <0.03 ng/mL (ref <0.031)

## 2014-12-15 MED ORDER — ADULT MULTIVITAMIN W/MINERALS CH
1.0000 | ORAL_TABLET | Freq: Every day | ORAL | Status: DC
  Administered 2014-12-16: 1 via ORAL
  Filled 2014-12-15: qty 1

## 2014-12-15 MED ORDER — MORPHINE SULFATE 2 MG/ML IJ SOLN
2.0000 mg | INTRAMUSCULAR | Status: DC | PRN
  Administered 2014-12-16: 2 mg via INTRAVENOUS
  Filled 2014-12-15: qty 1

## 2014-12-15 MED ORDER — ONDANSETRON HCL 4 MG/2ML IJ SOLN
4.0000 mg | Freq: Four times a day (QID) | INTRAMUSCULAR | Status: DC | PRN
  Filled 2014-12-15: qty 2

## 2014-12-15 MED ORDER — ASPIRIN 81 MG PO CHEW
324.0000 mg | CHEWABLE_TABLET | Freq: Once | ORAL | Status: AC
  Administered 2014-12-15: 324 mg via ORAL
  Filled 2014-12-15: qty 4

## 2014-12-15 MED ORDER — ACETAMINOPHEN 325 MG PO TABS: 650.0000 mg | ORAL_TABLET | ORAL | Status: DC | PRN

## 2014-12-15 MED ORDER — ONDANSETRON HCL 4 MG/2ML IJ SOLN
4.0000 mg | Freq: Once | INTRAMUSCULAR | Status: AC
  Administered 2014-12-15: 4 mg via INTRAVENOUS

## 2014-12-15 MED ORDER — GI COCKTAIL ~~LOC~~
30.0000 mL | Freq: Once | ORAL | Status: AC
  Administered 2014-12-15: 30 mL via ORAL
  Filled 2014-12-15: qty 30

## 2014-12-15 MED ORDER — HEPARIN SODIUM (PORCINE) 5000 UNIT/ML IJ SOLN
5000.0000 [IU] | Freq: Three times a day (TID) | INTRAMUSCULAR | Status: DC
  Filled 2014-12-15 (×3): qty 1

## 2014-12-15 MED ORDER — QUETIAPINE FUMARATE 50 MG PO TABS
50.0000 mg | ORAL_TABLET | Freq: Three times a day (TID) | ORAL | Status: DC
  Administered 2014-12-16 (×2): 50 mg via ORAL
  Filled 2014-12-15 (×4): qty 1

## 2014-12-15 MED ORDER — MORPHINE SULFATE 4 MG/ML IJ SOLN
2.0000 mg | Freq: Once | INTRAMUSCULAR | Status: AC
  Administered 2014-12-15: 2 mg via INTRAVENOUS
  Filled 2014-12-15: qty 1

## 2014-12-15 NOTE — H&P (Signed)
[facility] History and Physical  ROLANDO WHITBY HGD:[id number] DOB: 1976/02/23 DOA: 12/15/2014  Referring physician: EDP PCP: No PCP Per Patient   Chief Complaint: Chest pain   HPI: Felicia Perkins is a 39 y.o. female who presents to the ED with c/o 2 day history of epigastric abdominal pain, chest pain, R shoulder pain.  Symptoms onset 2 days ago.  Started with epigastric pain that radiated to the chest.  No associated N/V.  Did have some SOB.  No exertional symptoms, pain is worse with eating.  Sometimes comes on without triggers.  Last EtOH intake 2 days ago.  Used to be very heavy EtOH abuse but now only occasionally drinks due to being in school.  Review of Systems: Systems reviewed.  As above, otherwise negative  Past Medical History  Diagnosis Date  . PTSD (post-traumatic stress disorder)   . Alcohol abuse   . Bipolar 1 disorder    Past Surgical History  Procedure Laterality Date  . Other surgical history      ovarian surgery  . Foot surgery    . Tubal ligation    . Abdominal hysterectomy     Social History:  reports that she has been smoking Cigarettes.  She has been smoking about 1.00 pack per day. She has never used smokeless tobacco. She reports that she does not drink alcohol or use illicit drugs.  No Known Allergies  Family History  Problem Relation Age of Onset  . Heart attack Cousin 30    died in 59s of MI     Prior to Admission medications   Medication Sig Start Date End Date Taking? Authorizing Provider  Multiple Vitamins-Minerals (COMPLETE WOMENS) TABS Take 1 tablet by mouth daily.   Yes Historical Provider, MD  QUEtiapine (SEROQUEL) 25 MG tablet Take 50 mg by mouth 3 (three) times daily.   Yes Historical Provider, MD   Physical Exam: Filed Vitals:   12/15/14 2030  BP: 158/90  Pulse: 75  Temp:   Resp: 21    BP 158/90 mmHg  Pulse 75  Temp(Src) 98.6 F (37 C) (Oral)  Resp 21  Ht 5\' 4"  (1.626 m)  Wt 56.7 kg (125 lb)  BMI 21.45 kg/m2   SpO2 100%  General Appearance:    Alert, oriented, no distress, appears stated age  Head:    Normocephalic, atraumatic  Eyes:    PERRL, EOMI, sclera non-icteric        Nose:   Nares without drainage or epistaxis. Mucosa, turbinates normal  Throat:   Moist mucous membranes. Oropharynx without erythema or exudate.  Neck:   Supple. No carotid bruits.  No thyromegaly.  No lymphadenopathy.   Back:     No CVA tenderness, no spinal tenderness  Lungs:     Clear to auscultation bilaterally, without wheezes, rhonchi or rales  Chest wall:    No tenderness to palpitation  Heart:    Regular rate and rhythm without murmurs, gallops, rubs  Abdomen:     Soft, non-tender, nondistended, normal bowel sounds, no organomegaly  Genitalia:    deferred  Rectal:    deferred  Extremities:   No clubbing, cyanosis or edema.  Pulses:   2+ and symmetric all extremities  Skin:   Skin color, texture, turgor normal, no rashes or lesions  Lymph nodes:   Cervical, supraclavicular, and axillary nodes normal  Neurologic:   CNII-XII intact. Normal strength, sensation and reflexes      throughout    Labs on Admission:  Basic Metabolic Panel:  Recent Labs Lab 12/15/14 1801  NA 134*  K 3.6  CL 96*  CO2 22  GLUCOSE 113*  BUN <5*  CREATININE 0.51  CALCIUM 10.0   Liver Function Tests:  Recent Labs Lab 12/15/14 1801  AST 225*  ALT 106*  ALKPHOS 125  BILITOT 1.4*  PROT 7.7  ALBUMIN 4.3    Recent Labs Lab 12/15/14 1801  LIPASE 42   No results for input(s): AMMONIA in the last 168 hours. CBC:  Recent Labs Lab 12/15/14 1801  WBC 5.9  HGB 15.0  HCT 43.2  MCV 98.4  PLT 122*   Cardiac Enzymes:  Recent Labs Lab 12/15/14 1801  TROPONINI <0.03    BNP (last 3 results)  Recent Labs  01/25/14 1142  PROBNP 41.4   CBG: No results for input(s): GLUCAP in the last 168 hours.  Radiological Exams on Admission: Dg Chest 2 View  12/15/2014   CLINICAL DATA:  Chest pain for 3 days.  EXAM: CHEST   2 VIEW  COMPARISON:  PA and lateral chest and CT chest 01/25/2014.  FINDINGS: Heart size and mediastinal contours are within normal limits. Both lungs are clear. Visualized skeletal structures are unremarkable.  IMPRESSION: Negative exam.   Electronically Signed   By: Inge Rise M.D.   On: 12/15/2014 18:31   US Abdomen Complete  12/15/2014   CLINICAL DATA:  Right upper quadrant pain. Elevated liver function tests.  EXAM: ULTRASOUND ABDOMEN COMPLETE  COMPARISON:  Abdominal ultrasound 08/14/2011.  FINDINGS: Gallbladder: No gallstones or wall thickening visualized. No sonographic Murphy sign noted.  Common bile duct: Diameter: 0.4 cm  Liver: No focal lesion identified. Increased parenchymal echogenicity is consistent with fatty infiltration.  IVC: No abnormality visualized.  Pancreas: Visualized portion unremarkable.  Spleen: Size and appearance within normal limits.  Right Kidney: Length: 10.7 cm. Echogenicity within normal limits. No mass or hydronephrosis visualized.  Left Kidney: Length: 10.3 cm. Echogenicity within normal limits. No mass or hydronephrosis visualized.  Abdominal aorta: No aneurysm visualized.  Other findings: None.  IMPRESSION: Negative for gallstones.  No acute abnormality.  Fatty infiltration of the liver.   Electronically Signed   By: Inge Rise M.D.   On: 12/15/2014 21:29    EKG: Independently reviewed.  Assessment/Plan Active Problems:   Chest pain with moderate risk for cardiac etiology   1. Chest pain with moderate risk for cardiac etiology - 1. HEART score 4 2. Chest pain obs protocol 3. Serial trops 4. Tele monitor 5. Call cards in AM 2. Elevated LFTs - 2:1 AST:ALT ratio, mild EtOH hepatitis most likely 1. hepatitis panel ordered    Code Status: Full Code  Family Communication: No family in room Disposition Plan: Admit to obs   Time spent: 50 min  GARDNER, JARED M. [facility] Pager [phone]  If 7AM-7PM, please contact the day team  taking care of the patient [email or web address] Password [email or web address] 12/15/2014, 10:23 PM

## 2014-12-15 NOTE — ED Notes (Signed)
Patient transported to Ultrasound 

## 2014-12-15 NOTE — ED Notes (Signed)
Pt reports non radiating central chest pain for three days. Pt also reports right arm numbness started yesterday. Pt is very anxious and tearful in triage. Pt reports taking OTC medications without relief. No other symptoms reported.

## 2014-12-15 NOTE — ED Provider Notes (Signed)
CSN: [id number]     Arrival date & time 12/15/14  1747 History   First MD Initiated Contact with Patient 12/15/14 1826     Chief Complaint  Patient presents with  . Chest Pain  . Tingling     (Consider location/radiation/quality/duration/timing/severity/associated sxs/prior Treatment) HPI Akiko LYNNIE KOEHLER is a 39 y.o. female with history of PTSD, alcohol abuse, presents to emergency department complaining of epigastric abdominal pain, chest pain, right shoulder pain. Patient states that her symptoms started 2 days ago. States it started with epigastric pain that radiated to the chest. She denied any associated nausea or vomiting. She states that she had some shortness of breath. Denies exertional symptoms. States that her pain is worse with eating. Sometimes states it comes on without any triggers. She states yesterday she started having pain in the right shoulder and right upper arm. Today this pain worsened and she became concerned and came to emergency department. Patient admits to history of heavy alcohol use, but states she quit 3 months ago and only drinks intermittently now because she is in school. She denies any URI symptoms. No cough or congestion. No fever or chills. Did not try any medications prior to coming in.  Past Medical History  Diagnosis Date  . PTSD (post-traumatic stress disorder)   . Alcohol abuse   . Bipolar 1 disorder    Past Surgical History  Procedure Laterality Date  . Other surgical history      ovarian surgery  . Foot surgery    . Tubal ligation    . Abdominal hysterectomy     History reviewed. No pertinent family history. History  Substance Use Topics  . Smoking status: Current Every Day Smoker -- 1.00 packs/day    Types: Cigarettes  . Smokeless tobacco: Never Used  . Alcohol Use: No     Comment: 4 months    OB History    No data available     Review of Systems  Constitutional: Negative for fever and chills.  Respiratory: Positive for chest  tightness. Negative for cough and shortness of breath.   Cardiovascular: Negative for chest pain, palpitations and leg swelling.  Gastrointestinal: Positive for abdominal pain. Negative for nausea, vomiting and diarrhea.  Genitourinary: Negative for dysuria and flank pain.  Musculoskeletal: Positive for myalgias and arthralgias. Negative for neck pain and neck stiffness.  Skin: Negative for rash.  Neurological: Negative for dizziness, weakness and headaches.  All other systems reviewed and are negative.     Allergies  Review of patient's allergies indicates no known allergies.  Home Medications   Prior to Admission medications   Medication Sig Start Date End Date Taking? Authorizing Provider  Multiple Vitamins-Minerals (COMPLETE WOMENS) TABS Take 1 tablet by mouth daily.   Yes Historical Provider, MD  QUEtiapine (SEROQUEL) 25 MG tablet Take 50 mg by mouth 3 (three) times daily.   Yes Historical Provider, MD  azithromycin (ZITHROMAX) 250 MG tablet Take 1 tablet (250 mg total) by mouth daily. Take first 2 tablets together, then 1 every day until finished. Patient not taking: Reported on 12/15/2014 01/25/14   Delos Haring, PA-C  guaiFENesin-codeine 100-10 MG/5ML syrup Take 5 mLs by mouth 3 (three) times daily as needed for cough. Patient not taking: Reported on 12/15/2014 01/25/14   Delos Haring, PA-C  oxyCODONE-acetaminophen (PERCOCET/ROXICET) 5-325 MG per tablet Take 2 tablets by mouth every 6 (six) hours as needed for severe pain. Patient not taking: Reported on 12/15/2014 10/23/13   Montine Circle, PA-C  BP 168/98 mmHg  Pulse 85  Temp(Src) 98.6 F (37 C) (Oral)  Resp 14  Ht 5\' 4"  (1.626 m)  Wt 125 lb (56.7 kg)  BMI 21.45 kg/m2  SpO2 100% Physical Exam  Constitutional: She is oriented to person, place, and time. She appears well-developed and well-nourished. No distress.  HENT:  Head: Normocephalic.  Eyes: Conjunctivae are normal.  Neck: Neck supple.  Cardiovascular: Normal  rate, regular rhythm and normal heart sounds.   Pulmonary/Chest: Effort normal and breath sounds normal. No respiratory distress. She has no wheezes. She has no rales. She exhibits no tenderness.  Abdominal: Soft. Bowel sounds are normal. She exhibits no distension. There is tenderness. There is no rebound.  Epigastric tenderness, right upper quadrant tenderness  Musculoskeletal: She exhibits no edema.  Neurological: She is alert and oriented to person, place, and time.  Skin: Skin is warm and dry.  Psychiatric: She has a normal mood and affect. Her behavior is normal.  Nursing note and vitals reviewed.   ED Course  Procedures (including critical care time) Labs Review Labs Reviewed  CBC - Abnormal; Notable for the following:    MCH 34.2 (*)    Platelets 122 (*)    All other components within normal limits  BASIC METABOLIC PANEL - Abnormal; Notable for the following:    Sodium 134 (*)    Chloride 96 (*)    Glucose, Bld 113 (*)    BUN <5 (*)    Anion gap 16 (*)    All other components within normal limits  HEPATIC FUNCTION PANEL - Abnormal; Notable for the following:    AST 225 (*)    ALT 106 (*)    Total Bilirubin 1.4 (*)    Indirect Bilirubin 1.1 (*)    All other components within normal limits  TROPONIN I  LIPASE, BLOOD  I-STAT TROPOININ, ED    Imaging Review Dg Chest 2 View  12/15/2014   CLINICAL DATA:  Chest pain for 3 days.  EXAM: CHEST  2 VIEW  COMPARISON:  PA and lateral chest and CT chest 01/25/2014.  FINDINGS: Heart size and mediastinal contours are within normal limits. Both lungs are clear. Visualized skeletal structures are unremarkable.  IMPRESSION: Negative exam.   Electronically Signed   By: Inge Rise M.D.   On: 12/15/2014 18:31     EKG Interpretation   Date/Time:  Wednesday Dec 15 2014 17:55:07 EDT Ventricular Rate:  79 PR Interval:    QRS Duration: 86 QT Interval:  392 QTC Calculation: 449 R Axis:   39 Text Interpretation:  Accelerated  Junctional rhythm T wave abnormality,  consider inferior ischemia T wave abnormality, consider anterior ischemia  Abnormal ECG Confirmed by ZAMMIT  MD, JOSEPH [phone]) on 12/15/2014 7:04:01  PM      MDM   Final diagnoses:  Chest pain, unspecified chest pain type  Epigastric pain     patient with chest pain, abdominal pain, right shoulder pain. Patient's EKG shows new T-wave inversions. On exam also referred reducible epigastric pain right upper quadrant pain. Will get labs including LFTs and lipase. Chest x-ray ordered.Will give aspirin and GI cocktail.  Patient's LFTs have bumped. She does have history of alcohol use, however states she slowed down. Will get ultrasound to make sure this is not related to her gallbladder disease. Patient at this time denies any chest pain but states she is having some epigastric pain. Continues to have pain in the right arm.  Patient's ultrasound is negative. She  continues to have epigastric pain, not relieved with GI cocktail. Her EKG shows new T-wave inversions. she has some family history of heart disease. She is a smoker.  patient will be admitted to try to hospitalist.  Filed Vitals:   12/15/14 2030 12/15/14 2200 12/15/14 2215 12/15/14 2318  BP: 158/90 139/89 148/80 158/92  Pulse: 75 60 66 68  Temp:    97.9 F (36.6 C)  TempSrc:    Oral  Resp: 21 18 15 20   Height:    5\' 4"  (1.626 m)  Weight:    116 lb 13.5 oz (53 kg)  SpO2: 100% 99% 100% 98%     Jeannett Senior, PA-C 12/16/14 0037  Milton Ferguson, MD 12/16/14 1320

## 2014-12-16 ENCOUNTER — Observation Stay (HOSPITAL_COMMUNITY): Payer: Medicaid Other

## 2014-12-16 ENCOUNTER — Encounter (HOSPITAL_COMMUNITY): Payer: Self-pay

## 2014-12-16 DIAGNOSIS — R1013 Epigastric pain: Secondary | ICD-10-CM

## 2014-12-16 DIAGNOSIS — R7989 Other specified abnormal findings of blood chemistry: Secondary | ICD-10-CM

## 2014-12-16 DIAGNOSIS — R9431 Abnormal electrocardiogram [ECG] [EKG]: Secondary | ICD-10-CM

## 2014-12-16 DIAGNOSIS — R079 Chest pain, unspecified: Secondary | ICD-10-CM

## 2014-12-16 HISTORY — DX: Epigastric pain: R10.13

## 2014-12-16 LAB — RAPID URINE DRUG SCREEN, HOSP PERFORMED
AMPHETAMINES: NOT DETECTED
BENZODIAZEPINES: NOT DETECTED
Barbiturates: POSITIVE — AB
COCAINE: NOT DETECTED
Opiates: POSITIVE — AB
Tetrahydrocannabinol: POSITIVE — AB

## 2014-12-16 LAB — HEPATITIS PANEL, ACUTE
HCV Ab: NEGATIVE
HEP A IGM: NONREACTIVE
Hep B C IgM: NONREACTIVE
Hepatitis B Surface Ag: NEGATIVE

## 2014-12-16 LAB — TROPONIN I

## 2014-12-16 MED ORDER — FAMOTIDINE 20 MG PO TABS: 20.0000 mg | ORAL_TABLET | Freq: Two times a day (BID) | ORAL | Status: DC | PRN

## 2014-12-16 MED ORDER — GI COCKTAIL ~~LOC~~
30.0000 mL | Freq: Three times a day (TID) | ORAL | Status: DC | PRN
  Administered 2014-12-16: 30 mL via ORAL
  Filled 2014-12-16 (×2): qty 30

## 2014-12-16 MED ORDER — PANTOPRAZOLE SODIUM 40 MG PO TBEC
40.0000 mg | DELAYED_RELEASE_TABLET | Freq: Every day | ORAL | Status: DC
  Administered 2014-12-16: 40 mg via ORAL

## 2014-12-16 MED ORDER — PANTOPRAZOLE SODIUM 40 MG PO TBEC: 40.0000 mg | DELAYED_RELEASE_TABLET | Freq: Every day | ORAL | Status: DC

## 2014-12-16 NOTE — Consult Note (Signed)
CARDIOLOGY CONSULT NOTE  Patient ID: Felicia Perkins MRN: [id number] DOB/AGE: Jan 01, 1976 39 y.o.  Admit date: 12/15/2014 Reason for Consultation: Chest and epigastric pain, abnormal ECG  HPI: 39 yo with history of ETOH abuse and bipolar disorder presented with chest and epigastric pain. Patient used to drink heavily but says she quit about 2 months ago.  She continues to smoke cigarettes.  She is now back in school and was driving to her classes yesterday when she developed pleuritic lower central chest pain.  This migrated down to her epigastric area which became tender.  Pain radiated up to her right shoulder.  She has not had symptoms like this before.  She went to the ER.  There, she had normal lipase but elevated LFTs in an AST>ALT pattern.  Abdominal US today showed fatty liver but no gallstones.  Troponin negative.  ECG showed possible ectopic atrial rhythm (Ps do not appear to be sinus) along with nonspecific inferior and anterior T wave changes.  Prior ECG had a poor baseline but may not be much different.  This morning, patient still has tender epigastric area.  No more chest pain.   Review of systems complete and found to be negative unless listed above in HPI  Past Medical History: 1. ETOH abuse: Says she stopped drinking heavily about 2 months ago.  2. PTSD 3. Bipolar disorder  Family History  Problem Relation Age of Onset  . Heart attack Cousin 30    died in 64s of MI    History   Social History  . Marital Status: Single    Spouse Name: N/A  . Number of Children: N/A  . Years of Education: N/A   Occupational History  . Not on file.   Social History Main Topics  . Smoking status: Current Every Day Smoker -- 1.00 packs/day    Types: Cigarettes  . Smokeless tobacco: Never Used  . Alcohol Use: No     Comment: 4 months   . Drug Use: No  . Sexual Activity: Yes    Birth Control/ Protection: Condom   Other Topics Concern  . Not on file   Social History  Narrative     Prescriptions prior to admission  Medication Sig Dispense Refill Last Dose  . Multiple Vitamins-Minerals (COMPLETE WOMENS) TABS Take 1 tablet by mouth daily.   12/14/2014 at Unknown time  . QUEtiapine (SEROQUEL) 25 MG tablet Take 50 mg by mouth 3 (three) times daily.   12/14/2014 at Unknown time    Physical exam Blood pressure 123/91, pulse 76, temperature 97.9 F (36.6 C), temperature source Oral, resp. rate 18, height 5\' 4"  (1.626 m), weight 116 lb 13.5 oz (53 kg), SpO2 97 %. General: NAD Neck: No JVD, no thyromegaly or thyroid nodule.  Lungs: Clear to auscultation bilaterally with normal respiratory effort. CV: Nondisplaced PMI.  Heart regular S1/S2, no S3/S4, no murmur.  No peripheral edema.  No carotid bruit.  Normal pedal pulses.  Abdomen: Soft, mild epigastric/RUQ tenderness, no hepatosplenomegaly, no distention.  Skin: Intact without lesions or rashes.  Neurologic: Alert and oriented x 3.  Psych: Normal affect. Extremities: No clubbing or cyanosis.  HEENT: Normal.   Labs:   Lab Results  Component Value Date   WBC 5.9 12/15/2014   HGB 15.0 12/15/2014   HCT 43.2 12/15/2014   MCV 98.4 12/15/2014   PLT 122* 12/15/2014    Recent Labs Lab 12/15/14 1801  NA 134*  K 3.6  CL 96*  CO2 22  BUN <5*  CREATININE 0.51  CALCIUM 10.0  PROT 7.7  BILITOT 1.4*  ALKPHOS 125  ALT 106*  AST 225*  GLUCOSE 113*  lipase negative  Lab Results  Component Value Date   TROPONINI <0.03 12/16/2014   Radiology: - CXR: No acute abnormalities - Abdominal US: No gallstones, fatty liver  EKG: Abnormal P wave morphology, ?ectopic atrial focus, rSR' V1, inferior and anterior shallow T wave inversions.  Prior ECG from 6/15 had lots of artifact in the baseline, may not have been much different.   ASSESSMENT AND PLAN: 39 yo with history of ETOH abuse and bipolar disorder presented with chest and epigastric pain.  1. Chest/epigastric pain: Pleuritic lower chest pain with  epigastric pain and tenderness.  Normal lipase, elevated LFTs with AST>ALT pattern. [place] with fatty liver, no gallstones.  Most likely diagnosis appears to be ETOH hepatitis. She does say that she is no longer drinking heavily. Troponin negative. I do not think she needs stress testing, symptoms are likely GI.  2. Abnormal ECG:  ECG shows abnormal looking P waves, may be ectopic atrial focus driving her rhythm.  She has nonspecific T wave changes on ECG.  Her prior ECG had a poor baseline but may not be that unlike her ECG this admission.  Would get echo.  If this looks ok, no further workup.   Signed: Loralie Champagne 12/16/2014 9:47 AM

## 2014-12-16 NOTE — Care Management Note (Signed)
Case Management Note  Patient Details  Name: Felicia Perkins MRN: [id number] Date of Birth: March 07, 1976  Subjective/Objective:      Pt admitted with chest pain              Action/Plan: PTA pt lived at home- independent  Expected Discharge Date:  12/16/14               Expected Discharge Plan:  Home/Self Care  In-House Referral:     Discharge planning Services  Follow-up appt scheduled, CM Consult, [facility]  Post Acute Care Choice:    Choice offered to:     DME Arranged:    DME Agency:     HH Arranged:    [place] Agency:     Status of Service:  Completed, signed off  Medicare Important Message Given:    Date Medicare IM Given:    Medicare IM give by:    Date Additional Medicare IM Given:    Additional Medicare Important Message give by:     If discussed at [place] of Stay Meetings, dates discussed:    Additional Comments:   Follow up arranged at the [facility] for May 11 at 9:30- spoke with pt at bedside - info given on the clinic along with appointment arranged- pt informed that she could go the the clinic pharmacy to fill prescriptions - No further needs.   Dawayne Patricia, RN 12/16/2014, 2:25 PM

## 2014-12-16 NOTE — Progress Notes (Signed)
12/15/2014 11:20 PM  Patient arrived from the ED via stretcher. Patient oriented to staff, unit and room. Patient alert and oriented x4. Call bell within reach.RN will continue to monitor patient.  Ermalinda Memos, RN  [address] [employer] [phone]

## 2014-12-16 NOTE — Progress Notes (Signed)
Pt discharging via wheelchair, escorted by volunteer, accompanied by family. Discharge instructions provided to patient and family, verbalize understanding and able to provide appropriate teach back. PIV discontinued, site without s/s of complication. Tele box removed and returned to unit. Denies needs or questions at this time.

## 2014-12-16 NOTE — Discharge Instructions (Signed)
Avoid alcohol, oily and spicy food, reduce Caffeine intake to once a day or none at all if possible.  DO NOT TAKE IBUPROFEN, MOTRIN, ALLEVE OR NAPROSYN as they can make your pain worse

## 2014-12-16 NOTE — Progress Notes (Signed)
  Echocardiogram 2D Echocardiogram has been performed.  Felicia Perkins 12/16/2014, 11:28 AM

## 2014-12-16 NOTE — Discharge Summary (Addendum)
Physician Discharge Summary  Felicia Perkins WGN:[id number] DOB: 12-Jun-1976 DOA: 12/15/2014  PCP: No PCP Per Patient  Admit date: 12/15/2014 Discharge date: 12/16/2014  Time spent: 45 minutes  Recommendations for Outpatient Follow-up:  1. GI f/u if epigastric pain does not improve with treatment with PPI and PRN H2 blocker 2.   Discharge Condition: stable Diet recommendation: low sodium, heart healthy  Discharge Diagnoses:  Active Problems:   Chest pain/ Abdominal pain, epigastric- likely gastritis vs PUD Abnormal EKG Elevated LFTs - Fatty liver Thrombocytopenia  Polysubstance abuse  History of present illness:  This is a 39 year old female with a history of heavy alcohol abuse in the past now only occasional diffuse who presents with  chest and epigastric pain. The pain started about 2 days ago in the upper mid part of her stomach and chest radiating to her right shoulder. She did have some numbness and tingling in both her arms as well. No complaints of nausea vomiting diaphoresis or palpitations but she admitted to having some shortness of breath. She presented to the ER with this pain and was admitted to ensure that it was not cardiac related.  Hospital Course:  Chest/epigastric pain -Initial EKG in the ER revealed an atypical atrial rhythm; troponin 3 sets negative-pain unchanged after nitroglycerin -On my evaluation this morning she continues to have epigastric pain but no longer has chest pain or arm pain -I have requested a cardiology eval more for the EKG changes seen on admission-she has been evaluated by Dr. Aundra Dubin who is suspecting may be an ectopic atrial focus-repeat EKG yesterday revealed that she had converted to normal sinus rhythm with a short PR interval and EKG today reveals same -2-D echo has been performed and is without any abnormalities -At this point I will treat her pain is gastritis with daily Protonix and when necessary H2 blocker-she has been advised to stop  alcohol completely. -I have asked case management to arrange follow-up at [facility]. I have discussed dietary changes with her as well.  If pain does not improve with this treatment, she may need a GI referral for an EGD - abdominal ultrasound does not reveal gallstones but does reveal a fatty liver  Elevated LFTs/ thrombocytopenia - likely secondary to ETOH abuse - advised to stop drinking  Fatty liver - due to ETOH abuse  Polysubstance abuse - UDS positive for barbiturates, Opiates and tetrahydrocannabinol   Procedures: 2-D echo Left ventricle: The cavity size was normal. Wall thickness was normal. Systolic function was normal. The estimated ejection fraction was in the range of 60% to 65%. Wall motion was normal; there were no regional wall motion abnormalities. Left ventricular diastolic function parameters were normal. - Aortic valve: There was no stenosis. - Mitral valve: There was no regurgitation. - Right ventricle: The cavity size was normal. Systolic function was normal. - Tricuspid valve: Peak RV-RA gradient (S): 18 mm Hg. - Pulmonary arteries: PA peak pressure: 21 mm Hg (S). - Inferior vena cava: The vessel was normal in size. The respirophasic diameter changes were in the normal range (= 50%), consistent with normal central venous pressure.  Consultations:  Cardiology  Discharge Exam: Filed Weights   12/15/14 1753 12/15/14 2318  Weight: 56.7 kg (125 lb) 53 kg (116 lb 13.5 oz)   Filed Vitals:   12/16/14 0400  BP: 123/91  Pulse: 76  Temp: 97.9 F (36.6 C)  Resp: 18    General: AAO x 3, no distress Cardiovascular: RRR, no murmurs  Respiratory: clear to auscultation bilaterally GI: soft, non-tender, non-distended, bowel sound positive  Discharge Instructions You were cared for by a hospitalist during your hospital stay. If you have any questions about your discharge medications or the care you received while you were  in the hospital after you are discharged, you can call the unit and asked to speak with the hospitalist on call if the hospitalist that took care of you is not available. Once you are discharged, your primary care physician will handle any further medical issues. Please note that NO REFILLS for any discharge medications will be authorized once you are discharged, as it is imperative that you return to your primary care physician (or establish a relationship with a primary care physician if you do not have one) for your aftercare needs so that they can reassess your need for medications and monitor your lab values.      Discharge Instructions    Diet - low sodium heart healthy    Complete by:  As directed      Increase activity slowly    Complete by:  As directed             Medication List    TAKE these medications        COMPLETE WOMENS Tabs  Take 1 tablet by mouth daily.     famotidine 20 MG tablet  Commonly known as:  PEPCID  Take 1 tablet (20 mg total) by mouth 2 (two) times daily as needed for heartburn or indigestion.     pantoprazole 40 MG tablet  Commonly known as:  PROTONIX  Take 1 tablet (40 mg total) by mouth daily.     QUEtiapine 25 MG tablet  Commonly known as:  SEROQUEL  Take 50 mg by mouth 3 (three) times daily.       No Known Allergies    The results of significant diagnostics from this hospitalization (including imaging, microbiology, ancillary and laboratory) are listed below for reference.    Significant Diagnostic Studies: Dg Chest 2 View  12/15/2014   CLINICAL DATA:  Chest pain for 3 days.  EXAM: CHEST  2 VIEW  COMPARISON:  PA and lateral chest and CT chest 01/25/2014.  FINDINGS: Heart size and mediastinal contours are within normal limits. Both lungs are clear. Visualized skeletal structures are unremarkable.  IMPRESSION: Negative exam.   Electronically Signed   By: Inge Rise M.D.   On: 12/15/2014 18:31   US Abdomen Complete  12/15/2014    CLINICAL DATA:  Right upper quadrant pain. Elevated liver function tests.  EXAM: ULTRASOUND ABDOMEN COMPLETE  COMPARISON:  Abdominal ultrasound 08/14/2011.  FINDINGS: Gallbladder: No gallstones or wall thickening visualized. No sonographic Murphy sign noted.  Common bile duct: Diameter: 0.4 cm  Liver: No focal lesion identified. Increased parenchymal echogenicity is consistent with fatty infiltration.  IVC: No abnormality visualized.  Pancreas: Visualized portion unremarkable.  Spleen: Size and appearance within normal limits.  Right Kidney: Length: 10.7 cm. Echogenicity within normal limits. No mass or hydronephrosis visualized.  Left Kidney: Length: 10.3 cm. Echogenicity within normal limits. No mass or hydronephrosis visualized.  Abdominal aorta: No aneurysm visualized.  Other findings: None.  IMPRESSION: Negative for gallstones.  No acute abnormality.  Fatty infiltration of the liver.   Electronically Signed   By: Inge Rise M.D.   On: 12/15/2014 21:29    Microbiology: No results found for this or any previous visit (from the past 240 hour(s)).   Labs: Basic Metabolic Panel:  Recent Labs Lab 12/15/14 1801  NA 134*  K 3.6  CL 96*  CO2 22  GLUCOSE 113*  BUN <5*  CREATININE 0.51  CALCIUM 10.0   Liver Function Tests:  Recent Labs Lab 12/15/14 1801  AST 225*  ALT 106*  ALKPHOS 125  BILITOT 1.4*  PROT 7.7  ALBUMIN 4.3    Recent Labs Lab 12/15/14 1801  LIPASE 42   No results for input(s): AMMONIA in the last 168 hours. CBC:  Recent Labs Lab 12/15/14 1801  WBC 5.9  HGB 15.0  HCT 43.2  MCV 98.4  PLT 122*   Cardiac Enzymes:  Recent Labs Lab 12/15/14 1801 12/15/14 2225 12/16/14 0107 12/16/14 0433  TROPONINI <0.03 <0.03 <0.03 <0.03   BNP: BNP (last 3 results) No results for input(s): BNP in the last 8760 hours.  ProBNP (last 3 results)  Recent Labs  01/25/14 1142  PROBNP 41.4    CBG: No results for input(s): GLUCAP in the last 168  hours.     SignedDebbe Odea, MD [facility] 12/16/2014, 1:20 PM

## 2014-12-22 ENCOUNTER — Ambulatory Visit: Payer: Self-pay | Attending: Family Medicine | Admitting: Family Medicine

## 2014-12-22 ENCOUNTER — Encounter: Payer: Self-pay | Admitting: Family Medicine

## 2014-12-22 VITALS — BP 142/92 | HR 69 | Temp 97.8°F | Resp 18 | Ht 64.0 in | Wt 119.0 lb

## 2014-12-22 DIAGNOSIS — R748 Abnormal levels of other serum enzymes: Secondary | ICD-10-CM

## 2014-12-22 DIAGNOSIS — R1013 Epigastric pain: Secondary | ICD-10-CM

## 2014-12-22 DIAGNOSIS — R0789 Other chest pain: Secondary | ICD-10-CM

## 2014-12-22 DIAGNOSIS — Z720 Tobacco use: Secondary | ICD-10-CM | POA: Insufficient documentation

## 2014-12-22 DIAGNOSIS — F17200 Nicotine dependence, unspecified, uncomplicated: Secondary | ICD-10-CM

## 2014-12-22 HISTORY — DX: Abnormal levels of other serum enzymes: R74.8

## 2014-12-22 MED ORDER — NICOTINE 7 MG/24HR TD PT24: 7.0000 mg | MEDICATED_PATCH | Freq: Every day | TRANSDERMAL | Status: DC

## 2014-12-22 MED ORDER — PANTOPRAZOLE SODIUM 40 MG PO TBEC: 40.0000 mg | DELAYED_RELEASE_TABLET | Freq: Every day | ORAL | Status: DC

## 2014-12-22 NOTE — Progress Notes (Signed)
Subjective:    Patient ID: Felicia Perkins, female    DOB: 1976-01-28, 39 y.o.   MRN: [id number]  HPI] Admit date: 12/15/14 Discharge date: 12/16/14  Felicia Perkins had presented to the ED with chest pain and epigastric pain radiating to the right shoulder, associated numbness, dyspnea but no diaphoresis.  On presentation at the ED, vitals are stable, troponin 3 were negative, she had elevated LFTs, she received nitroglycerin and had an EKG which initially revealed junctional rhythm, T wave abnormalities suggestive of ischemia and repeat revealed normal sinus rhythm with short PR interval and T-wave changes suggestive of inferior wall ischemia and she was evaluated by cardiology who suspected this may be an ectopic atrial focus. 2-D echo revealed an EF of 60-65% and no other abnormalities. Abdominal ultrasound revealed fatty liver.  She was diagnosed with gastritis as etiology of atypical chest pain and placed on a PPI.   Interval History: Reports doing well and has been compliant with her PPI but does complain of the cost. She is attending alcoholic anonymous meetings and is determined to quit alcohol but would need help with quitting cigarettes and is requesting patches. She sees a psychiatrist at [facility] who manages her bipolar disorder.   Past Medical History  Diagnosis Date  . PTSD (post-traumatic stress disorder)   . Alcohol abuse   . Bipolar 1 disorder   . Anxiety     Past Surgical History  Procedure Laterality Date  . Other surgical history      ovarian surgery  . Foot surgery    . Tubal ligation    . Abdominal hysterectomy      Family History  Problem Relation Age of Onset  . Heart attack Cousin 30    died in 101s of MI  . Hypertension Mother   . Cancer Mother     breast cancer  . Hypertension Father     History   Social History  . Marital Status: Single    Spouse Name: N/A  . Number of Children: N/A  . Years of Education: N/A   Occupational History    . Not on file.   Social History Main Topics  . Smoking status: Light Tobacco Smoker -- 1.00 packs/day    Types: Cigarettes  . Smokeless tobacco: Never Used  . Alcohol Use: No     Comment: 4 months   . Drug Use: No  . Sexual Activity: Yes    Birth Control/ Protection: Condom   Other Topics Concern  . Not on file   Social History Narrative    No Known Allergies  Review of Systems  Constitutional: Negative for activity change, appetite change and fatigue.  HENT: Negative for congestion, sinus pressure and sore throat.   Eyes: Negative for visual disturbance.  Respiratory: Negative for cough, chest tightness, shortness of breath and wheezing.   Cardiovascular: Negative for chest pain and palpitations.  Gastrointestinal: Negative for abdominal pain, constipation and abdominal distention.  Endocrine: Negative for polydipsia.  Genitourinary: Negative for dysuria and frequency.  Musculoskeletal: Negative for back pain and arthralgias.  Skin: Negative for rash.  Neurological: Negative for tremors, light-headedness and numbness.  Hematological: Does not bruise/bleed easily.  Psychiatric/Behavioral: Negative for behavioral problems and agitation.         Objective: Filed Vitals:   12/22/14 0905  BP: 142/92  Pulse: 69  Temp: 97.8 F (36.6 C)  Resp: 18      Physical Exam  Constitutional: She is oriented to person, place, and  time. She appears well-developed and well-nourished. No distress.  HENT:  Head: Normocephalic.  Right Ear: External ear normal.  Left Ear: External ear normal.  Nose: Nose normal.  Mouth/Throat: Oropharynx is clear and moist.  Eyes: Conjunctivae and EOM are normal. Pupils are equal, round, and reactive to light.  Neck: Normal range of motion. No JVD present.  Cardiovascular: Normal rate, regular rhythm, normal heart sounds and intact distal pulses.  Exam reveals no gallop.   No murmur heard. Pulmonary/Chest: Effort normal and breath sounds normal.  No respiratory distress. She has no wheezes. She has no rales. She exhibits no tenderness.  Abdominal: Soft. Bowel sounds are normal. She exhibits no distension and no mass. There is no tenderness.  Musculoskeletal: Normal range of motion. She exhibits no edema or tenderness.  Neurological: She is alert and oriented to person, place, and time. She has normal reflexes.  Skin: Skin is warm and dry. She is not diaphoretic.  Psychiatric: She has a normal mood and affect.    EXAM: ULTRASOUND ABDOMEN COMPLETE  COMPARISON: Abdominal ultrasound 08/14/2011.  FINDINGS: Gallbladder: No gallstones or wall thickening visualized. No sonographic Murphy sign noted.  Common bile duct: Diameter: 0.4 cm  Liver: No focal lesion identified. Increased parenchymal echogenicity is consistent with fatty infiltration.  IVC: No abnormality visualized.  Pancreas: Visualized portion unremarkable.  Spleen: Size and appearance within normal limits.  Right Kidney: Length: 10.7 cm. Echogenicity within normal limits. No mass or hydronephrosis visualized.  Left Kidney: Length: 10.3 cm. Echogenicity within normal limits. No mass or hydronephrosis visualized.  Abdominal aorta: No aneurysm visualized.  Other findings: None.  IMPRESSION: Negative for gallstones. No acute abnormality.  Fatty infiltration of the liver.   Electronically Signed  By: Inge Rise M.D.  On: 12/15/2014 21:29      Assessment & Plan:  39 year old female patient with a history of bipolar disorder recently managed for atypical chest pain which was thought to be of GI etiology currently doing well on medications.  Atypical chest pain/epigastric pain: Resolved with the use of PPI which have encouraged her to continue. Avoid late meals.  Elevated liver enzymes: Likely secondary to alcohol use. She is attending [employer] meetings and hopefully that should help.  Tobacco use disorder:Smoking cessation support:  smoking cessation hotline: 1-800-QUIT-NOW.  Smoking cessation classes are available through [facility]. Call [phone] or visit our website at [email or web address]  Spent 3 minutes counseling on smoking cessation and patient is ready to quit. Placed on nicotine patches

## 2014-12-22 NOTE — Progress Notes (Signed)
Patient hospitalized for chest pain, diagnosed with inflamed liver, stomach ulcer and thin layer of fat around heart. Patient has changed eating habits. Patient reports feeling better. Patient denies pain at this time. Patient could only afford 10 pantoprazole because they were $69. Patient ready to quit smoking.

## 2014-12-22 NOTE — Patient Instructions (Signed)
Smoking Cessation Quitting smoking is important to your health and has many advantages. However, it is not always easy to quit since nicotine is a very addictive drug. Oftentimes, people try 3 times or more before being able to quit. This document explains the best ways for you to prepare to quit smoking. Quitting takes hard work and a lot of effort, but you can do it. ADVANTAGES OF QUITTING SMOKING  You will live longer, feel better, and live better.  Your body will feel the impact of quitting smoking almost immediately.  Within 20 minutes, blood pressure decreases. Your pulse returns to its normal level.  After 8 hours, carbon monoxide levels in the blood return to normal. Your oxygen level increases.  After 24 hours, the chance of having a heart attack starts to decrease. Your breath, hair, and body stop smelling like smoke.  After 48 hours, damaged nerve endings begin to recover. Your sense of taste and smell improve.  After 72 hours, the body is virtually free of nicotine. Your bronchial tubes relax and breathing becomes easier.  After 2 to 12 weeks, lungs can hold more air. Exercise becomes easier and circulation improves.  The risk of having a heart attack, stroke, cancer, or lung disease is greatly reduced.  After 1 year, the risk of coronary heart disease is cut in half.  After 5 years, the risk of stroke falls to the same as a nonsmoker.  After 10 years, the risk of lung cancer is cut in half and the risk of other cancers decreases significantly.  After 15 years, the risk of coronary heart disease drops, usually to the level of a nonsmoker.  If you are pregnant, quitting smoking will improve your chances of having a healthy baby.  The people you live with, especially any children, will be healthier.  You will have extra money to spend on things other than cigarettes. QUESTIONS TO THINK ABOUT BEFORE ATTEMPTING TO QUIT You may want to talk about your answers with your  health care provider.  Why do you want to quit?  If you tried to quit in the past, what helped and what did not?  What will be the most difficult situations for you after you quit? How will you plan to handle them?  Who can help you through the tough times? Your family? Friends? A health care provider?  What pleasures do you get from smoking? What ways can you still get pleasure if you quit? Here are some questions to ask your health care provider:  How can you help me to be successful at quitting?  What medicine do you think would be best for me and how should I take it?  What should I do if I need more help?  What is smoking withdrawal like? How can I get information on withdrawal? GET READY  Set a quit date.  Change your environment by getting rid of all cigarettes, ashtrays, matches, and lighters in your home, car, or work. Do not let people smoke in your home.  Review your past attempts to quit. Think about what worked and what did not. GET SUPPORT AND ENCOURAGEMENT You have a better chance of being successful if you have help. You can get support in many ways.  Tell your family, friends, and coworkers that you are going to quit and need their support. Ask them not to smoke around you.  Get individual, group, or telephone counseling and support. Programs are available at local hospitals and health centers. Call   your local health department for information about programs in your area.  Spiritual beliefs and practices may help some smokers quit.  Download a "quit meter" on your computer to keep track of quit statistics, such as how long you have gone without smoking, cigarettes not smoked, and money saved.  Get a self-help book about quitting smoking and staying off tobacco. LEARN NEW SKILLS AND BEHAVIORS  Distract yourself from urges to smoke. Talk to someone, go for a walk, or occupy your time with a task.  Change your normal routine. Take a different route to work.  Drink tea instead of coffee. Eat breakfast in a different place.  Reduce your stress. Take a hot bath, exercise, or read a book.  Plan something enjoyable to do every day. Reward yourself for not smoking.  Explore interactive web-based programs that specialize in helping you quit. GET MEDICINE AND USE IT CORRECTLY Medicines can help you stop smoking and decrease the urge to smoke. Combining medicine with the above behavioral methods and support can greatly increase your chances of successfully quitting smoking.  Nicotine replacement therapy helps deliver nicotine to your body without the negative effects and risks of smoking. Nicotine replacement therapy includes nicotine gum, lozenges, inhalers, nasal sprays, and skin patches. Some may be available over-the-counter and others require a prescription.  Antidepressant medicine helps people abstain from smoking, but how this works is unknown. This medicine is available by prescription.  Nicotinic receptor partial agonist medicine simulates the effect of nicotine in your brain. This medicine is available by prescription. Ask your health care provider for advice about which medicines to use and how to use them based on your health history. Your health care provider will tell you what side effects to look out for if you choose to be on a medicine or therapy. Carefully read the information on the package. Do not use any other product containing nicotine while using a nicotine replacement product.  RELAPSE OR DIFFICULT SITUATIONS Most relapses occur within the first 3 months after quitting. Do not be discouraged if you start smoking again. Remember, most people try several times before finally quitting. You may have symptoms of withdrawal because your body is used to nicotine. You may crave cigarettes, be irritable, feel very hungry, cough often, get headaches, or have difficulty concentrating. The withdrawal symptoms are only temporary. They are strongest  when you first quit, but they will go away within 10-14 days. To reduce the chances of relapse, try to:  Avoid drinking alcohol. Drinking lowers your chances of successfully quitting.  Reduce the amount of caffeine you consume. Once you quit smoking, the amount of caffeine in your body increases and can give you symptoms, such as a rapid heartbeat, sweating, and anxiety.  Avoid smokers because they can make you want to smoke.  Do not let weight gain distract you. Many smokers will gain weight when they quit, usually less than 10 pounds. Eat a healthy diet and stay active. You can always lose the weight gained after you quit.  Find ways to improve your mood other than smoking. FOR MORE INFORMATION  www.smokefree.gov  Document Released: 07/24/2001 Document Revised: 12/14/2013 Document Reviewed: 11/08/2011 [employer] Patient Information 2015 [employer]. This information is not intended to replace advice given to you by your health care provider. Make sure you discuss any questions you have with your health care provider.  

## 2015-01-17 ENCOUNTER — Ambulatory Visit: Payer: Self-pay

## 2015-04-26 ENCOUNTER — Emergency Department (HOSPITAL_COMMUNITY)
Admission: EM | Admit: 2015-04-26 | Discharge: 2015-04-26 | Disposition: A | Discharge status: Home / Self Care | Payer: Medicaid Other | Attending: Emergency Medicine | Admitting: Emergency Medicine

## 2015-04-26 ENCOUNTER — Emergency Department (HOSPITAL_COMMUNITY): Payer: Medicaid Other

## 2015-04-26 ENCOUNTER — Encounter (HOSPITAL_COMMUNITY): Payer: Self-pay | Admitting: Emergency Medicine

## 2015-04-26 DIAGNOSIS — Z720 Tobacco use: Secondary | ICD-10-CM | POA: Insufficient documentation

## 2015-04-26 DIAGNOSIS — R05 Cough: Secondary | ICD-10-CM | POA: Diagnosis not present

## 2015-04-26 DIAGNOSIS — R059 Cough, unspecified: Secondary | ICD-10-CM

## 2015-04-26 DIAGNOSIS — F4310 Post-traumatic stress disorder, unspecified: Secondary | ICD-10-CM | POA: Insufficient documentation

## 2015-04-26 DIAGNOSIS — Z79899 Other long term (current) drug therapy: Secondary | ICD-10-CM | POA: Diagnosis not present

## 2015-04-26 DIAGNOSIS — F319 Bipolar disorder, unspecified: Secondary | ICD-10-CM | POA: Insufficient documentation

## 2015-04-26 MED ORDER — ALBUTEROL SULFATE HFA 108 (90 BASE) MCG/ACT IN AERS: 1.0000 | INHALATION_SPRAY | Freq: Four times a day (QID) | RESPIRATORY_TRACT | Status: DC | PRN

## 2015-04-26 MED ORDER — IPRATROPIUM-ALBUTEROL 0.5-2.5 (3) MG/3ML IN SOLN
RESPIRATORY_TRACT | Status: AC
  Filled 2015-04-26: qty 3

## 2015-04-26 MED ORDER — PROMETHAZINE-DM 6.25-15 MG/5ML PO SYRP: 2.5000 mL | ORAL_SOLUTION | Freq: Four times a day (QID) | ORAL | Status: DC | PRN

## 2015-04-26 MED ORDER — IPRATROPIUM-ALBUTEROL 0.5-2.5 (3) MG/3ML IN SOLN
3.0000 mL | Freq: Once | RESPIRATORY_TRACT | Status: AC
  Administered 2015-04-26: 3 mL via RESPIRATORY_TRACT

## 2015-04-26 MED ORDER — BENZONATATE 100 MG PO CAPS: 100.0000 mg | ORAL_CAPSULE | Freq: Three times a day (TID) | ORAL | Status: DC

## 2015-04-26 NOTE — Discharge Instructions (Signed)
Cough, Adult  A cough is a reflex. It helps you clear your throat and airways. A cough can help heal your body. A cough can last 2 or 3 weeks (acute) or may last more than 8 weeks (chronic). Some common causes of a cough can include an infection, allergy, or a cold. HOME CARE  Only take medicine as told by your doctor.  If given, take your medicines (antibiotics) as told. Finish them even if you start to feel better.  Use a cold steam vaporizer or humidifier in your home. This can help loosen thick spit (secretions).  Sleep so you are almost sitting up (semi-upright). Use pillows to do this. This helps reduce coughing.  Rest as needed.  Stop smoking if you smoke. GET HELP RIGHT AWAY IF:  You have yellowish-white fluid (pus) in your thick spit.  Your cough gets worse.  Your medicine does not reduce coughing, and you are losing sleep.  You cough up blood.  You have trouble breathing.  Your pain gets worse and medicine does not help.  You have a fever. MAKE SURE YOU:   Understand these instructions.  Will watch your condition.  Will get help right away if you are not doing well or get worse. Document Released: 04/12/2011 Document Revised: 12/14/2013 Document Reviewed: 04/12/2011 [facility] Patient Information 2015 [place], [place]. This information is not intended to replace advice given to you by your health care provider. Make sure you discuss any questions you have with your health care provider.  Please follow-up with your primary care provider for reevaluation. Please use medication only as directed. If new worsening signs or symptoms present please return immediately.

## 2015-04-26 NOTE — ED Provider Notes (Signed)
CSN: [id number]     Arrival date & time 04/26/15  1327 History  This chart was scribed for Okey Regal, PA-C, working with Virgel Manifold, MD by Starleen Arms, ED Scribe. This patient was seen in room TR05C/TR05C and the patient's care was started at 4:22 PM.   Chief Complaint  Patient presents with  . Cough   The history is provided by the patient. No language interpreter was used.   HPI Comments: Felicia Perkins is a 39 y.o. female with hx of seasonal allergies who presents to the Emergency Department complaining of a constant, unchanged, moderate-severe, dry cough onset 1 week ago, not improved with a single dose of robitussin.  Associated symptoms include rhinorrhea, watery eyes, and trouble sleeping due to coughing.  She denies hx of asthma.  Patient is a current smoker.  She denies CP, fever, chills, n/v.  Past Medical History  Diagnosis Date  . PTSD (post-traumatic stress disorder)   . Alcohol abuse   . Bipolar 1 disorder   . Anxiety    Past Surgical History  Procedure Laterality Date  . Other surgical history      ovarian surgery  . Foot surgery    . Tubal ligation    . Abdominal hysterectomy     Family History  Problem Relation Age of Onset  . Heart attack Cousin 30    died in 66s of MI  . Hypertension Mother   . Cancer Mother     breast cancer  . Hypertension Father    Social History  Substance Use Topics  . Smoking status: Light Tobacco Smoker -- 1.00 packs/day    Types: Cigarettes  . Smokeless tobacco: Never Used  . Alcohol Use: No     Comment: 4 months    OB History    No data available     Review of Systems  All other systems reviewed and are negative.    Allergies  Review of patient's allergies indicates no known allergies.  Home Medications   Prior to Admission medications   Medication Sig Start Date End Date Taking? Authorizing Provider  albuterol (PROVENTIL HFA;VENTOLIN HFA) 108 (90 BASE) MCG/ACT inhaler Inhale 1-2 puffs into the lungs  every 6 (six) hours as needed for wheezing or shortness of breath. 04/26/15   Okey Regal, PA-C  benzonatate (TESSALON) 100 MG capsule Take 1 capsule (100 mg total) by mouth every 8 (eight) hours. 04/26/15   Okey Regal, PA-C  famotidine (PEPCID) 20 MG tablet Take 1 tablet (20 mg total) by mouth 2 (two) times daily as needed for heartburn or indigestion. 12/16/14   [name], MD  Multiple Vitamins-Minerals (COMPLETE WOMENS) TABS Take 1 tablet by mouth daily.    Historical Provider, MD  nicotine (NICODERM CQ) 7 mg/24hr patch Place 1 patch (7 mg total) onto the skin daily. 12/22/14   [name], MD  pantoprazole (PROTONIX) 40 MG tablet Take 1 tablet (40 mg total) by mouth daily. 12/22/14   [name], MD  promethazine-dextromethorphan (PROMETHAZINE-DM) 6.25-15 MG/5ML syrup Take 2.5 mLs by mouth 4 (four) times daily as needed for cough. 04/26/15   Okey Regal, PA-C  QUEtiapine (SEROQUEL) 25 MG tablet Take 50 mg by mouth 3 (three) times daily.    Historical Provider, MD   BP 122/83 mmHg  Pulse 69  Temp(Src) 98.5 F (36.9 C) (Oral)  Resp 20  SpO2 99%   Physical Exam  Constitutional: She is oriented to person, place, and time. She appears well-developed and well-nourished. No distress.  HENT:  Head: Normocephalic and atraumatic.  Right Ear: External ear normal.  Left Ear: External ear normal.  Mouth/Throat: Oropharynx is clear and moist. No oropharyngeal exudate.  TM's normal.  Throat normal.   Eyes: Conjunctivae and EOM are normal.  Neck: Neck supple. No tracheal deviation present.  Cardiovascular: Normal rate, regular rhythm and normal heart sounds.   No murmur heard. Heart exam normal  Pulmonary/Chest: Effort normal and breath sounds normal. No respiratory distress. She has no wheezes. She has no rhonchi. She has no rales. She exhibits no tenderness.  Lungs CTA.  Musculoskeletal: Normal range of motion.  Neurological: She is alert and oriented to person, place, and time.  Skin:  Skin is warm and dry.  Psychiatric: She has a normal mood and affect. Her behavior is normal.  Nursing note and vitals reviewed.   ED Course  Procedures (including critical care time) Labs Review Labs Reviewed - No data to display  Imaging Review No results found. I have personally reviewed and evaluated these images and lab results as part of my medical decision-making.   EKG Interpretation None      MDM   Final diagnoses:  Cough   Labs:  Imaging: DG Chest 2 View  Therapeutics: Duone[employer]  Assessment/Plan: Patient presents with cough, no other significant findings. She was given a breathing treatment hereprior to my evaluation, she has no wheezing on exam. She appears in no acute distress. She ontinues to have a nonproductive cough during my evaluation, she will be given benzonatate, and promethazine dextromethorphan for her cough. She is instructed to fill the promethazine if this care plan does not work, discontinue and attempt the second medication. Patient also given a albuterol inhaler. Patient given strict return precautions, verbalizes understanding and agreement for today's plan.  Discharge Meds: Albuterol   I personally performed the services described in this documentation, which was scribed in my presence. The recorded information has been reviewed and is accurate.     Okey Regal, PA-C 04/29/15 [place], MD 05/04/15 [phone]

## 2015-04-26 NOTE — ED Notes (Signed)
Pt sts cough x 1 week worse and night and some wheezing

## 2015-04-27 ENCOUNTER — Telehealth: Payer: Self-pay | Admitting: *Deleted

## 2015-04-27 NOTE — Telephone Encounter (Signed)
Pt called stating pharmacy would not fill Rx because MD is not on Medicaid provider list.  NCM called pharmacy ([facility]) to give name of MD on list.

## 2015-05-25 ENCOUNTER — Ambulatory Visit: Payer: Self-pay | Admitting: Family Medicine

## 2015-05-26 ENCOUNTER — Emergency Department (HOSPITAL_COMMUNITY): Payer: Medicaid Other

## 2015-05-26 ENCOUNTER — Encounter (HOSPITAL_COMMUNITY): Payer: Self-pay | Admitting: Emergency Medicine

## 2015-05-26 ENCOUNTER — Emergency Department (HOSPITAL_COMMUNITY)
Admission: EM | Admit: 2015-05-26 | Discharge: 2015-05-26 | Disposition: A | Discharge status: Home / Self Care | Payer: Medicaid Other | Attending: Emergency Medicine | Admitting: Emergency Medicine

## 2015-05-26 DIAGNOSIS — F319 Bipolar disorder, unspecified: Secondary | ICD-10-CM | POA: Diagnosis not present

## 2015-05-26 DIAGNOSIS — J441 Chronic obstructive pulmonary disease with (acute) exacerbation: Secondary | ICD-10-CM | POA: Insufficient documentation

## 2015-05-26 DIAGNOSIS — R059 Cough, unspecified: Secondary | ICD-10-CM

## 2015-05-26 DIAGNOSIS — Z720 Tobacco use: Secondary | ICD-10-CM | POA: Diagnosis not present

## 2015-05-26 DIAGNOSIS — R0600 Dyspnea, unspecified: Secondary | ICD-10-CM

## 2015-05-26 DIAGNOSIS — K219 Gastro-esophageal reflux disease without esophagitis: Secondary | ICD-10-CM | POA: Insufficient documentation

## 2015-05-26 DIAGNOSIS — Z79899 Other long term (current) drug therapy: Secondary | ICD-10-CM | POA: Insufficient documentation

## 2015-05-26 DIAGNOSIS — R05 Cough: Secondary | ICD-10-CM

## 2015-05-26 DIAGNOSIS — J449 Chronic obstructive pulmonary disease, unspecified: Secondary | ICD-10-CM

## 2015-05-26 DIAGNOSIS — R062 Wheezing: Secondary | ICD-10-CM | POA: Diagnosis present

## 2015-05-26 MED ORDER — OMEPRAZOLE 20 MG PO CPDR: 20.0000 mg | DELAYED_RELEASE_CAPSULE | Freq: Two times a day (BID) | ORAL | Status: DC

## 2015-05-26 MED ORDER — ALBUTEROL SULFATE HFA 108 (90 BASE) MCG/ACT IN AERS
2.0000 | INHALATION_SPRAY | RESPIRATORY_TRACT | Status: DC | PRN
Start: 2015-05-26 — End: 2015-05-27
  Filled 2015-05-26: qty 6.7

## 2015-05-26 MED ORDER — SUCRALFATE 1 G PO TABS: 1.0000 g | ORAL_TABLET | Freq: Three times a day (TID) | ORAL | Status: DC

## 2015-05-26 MED ORDER — AEROCHAMBER PLUS FLO-VU LARGE MISC
1.0000 | Freq: Once | Status: AC
  Administered 2015-05-26: 1
  Filled 2015-05-26 (×2): qty 1

## 2015-05-26 MED ORDER — SUCRALFATE 1 G PO TABS
1.0000 g | ORAL_TABLET | Freq: Once | ORAL | Status: AC
  Administered 2015-05-26: 1 g via ORAL
  Filled 2015-05-26: qty 1

## 2015-05-26 NOTE — Discharge Instructions (Signed)
Your x-ray shows that you have the early stages of emphysema or COPD.  You have been given some information on this I also feel that the majority of your symptoms are caused by your gastric reflux.  I have given you to medications, to start taking one is Carafate.  You going to start taking this as directed before meals and before bedtime for a week as well as starting the Prilosec twice a day for 1 week and just back off to only the Prilosec once a day. I would like you to make an appointment with lobe.  Our gastroenterology for further evaluation of your GERD I would also like you to make an appointment with your primary care physician to discuss your early stages of emphysema/COPD You have been given an inhaler with a spacer.  I would like you to use this on a regular basis every 4-6 hours 2 puffs while awake.  If you wake during the night and feel that you cannot breathe or coughing.  Please feel free to use it, but do not set the clock for this.  Do this for 2 days and then as needed

## 2015-05-26 NOTE — ED Notes (Signed)
Pt reports intermittent wheezing and coughing, worsening around 0400 in the morning. Was recently prescribed Albuterol and Proventil with a cough medication with codeine. No alleviation of symptoms with these medications. Pt has a dry, strong cough in triage. Having productive, yellow sputum. Says she smokes tobacco products "only some." Denies fever. Hx asthma, COPD. Was told "allergies triggered this" last time she was seen. No other c/c. RR even/unlabored.

## 2015-05-26 NOTE — ED Provider Notes (Addendum)
CSN: [id number]     Arrival date & time 05/26/15  1944 History  By signing my name below, I, Felicia Perkins, attest that this documentation has been prepared under the direction and in the presence of [name], NP-C. Electronically Signed: [name], ED Scribe. 05/26/2015. 8:44 PM.  Chief Complaint  Patient presents with  . Wheezing  . Asthma   HPI Comments: hx of GERD who presents to the Emergency Department complaining of constant, gradually worsening wheezing and productive cough with yellow sputum onset earlier today  Patient is a 39 y.o. female presenting with wheezing and asthma. The history is provided by the patient. No language interpreter was used.  Wheezing Associated symptoms: no chest pain   Asthma Pertinent negatives include no chest pain.    HPI Comments: Felicia Perkins is a 39 y.o. female with a hx of GERD who presents to the Emergency Department complaining of constant, gradually worsening wheezing and productive cough with yellow sputum onset earlier today. Pt states that she has been taking OTC medications and albuterol inhaler to relief, but symptoms will come back when the medication wears off. She reports worsening cough at night when laying down and wakes up due to coughing around 4 AM. She denies fever, hx of asthma, or hx or COPD. Pt states that she is a smoker and is seen at [facility].  Past Medical History  Diagnosis Date  . PTSD (post-traumatic stress disorder)   . Alcohol abuse   . Bipolar 1 disorder ([place])   . Anxiety    Past Surgical History  Procedure Laterality Date  . Other surgical history      ovarian surgery  . Foot surgery    . Tubal ligation    . Abdominal hysterectomy     Family History  Problem Relation Age of Onset  . Heart attack Cousin 30    died in 20s of MI  . Hypertension Mother   . Cancer Mother     breast cancer  . Hypertension Father    Social History  Substance Use Topics  . Smoking status: Light  Tobacco Smoker -- 1.00 packs/day    Types: Cigarettes  . Smokeless tobacco: Never Used  . Alcohol Use: No     Comment: 4 months    OB History    No data available     Review of Systems  Respiratory: Positive for wheezing.   Cardiovascular: Negative for chest pain.  Gastrointestinal: Negative for abdominal distention.      Allergies  Review of patient's allergies indicates no known allergies.  Home Medications   Prior to Admission medications   Medication Sig Start Date End Date Taking? Authorizing Provider  albuterol (PROVENTIL HFA;VENTOLIN HFA) 108 (90 BASE) MCG/ACT inhaler Inhale 1-2 puffs into the lungs every 6 (six) hours as needed for wheezing or shortness of breath. 04/26/15   Okey Regal, PA-C  benzonatate (TESSALON) 100 MG capsule Take 1 capsule (100 mg total) by mouth every 8 (eight) hours. 04/26/15   Okey Regal, PA-C  famotidine (PEPCID) 20 MG tablet Take 1 tablet (20 mg total) by mouth 2 (two) times daily as needed for heartburn or indigestion. 12/16/14   [name], MD  Multiple Vitamins-Minerals (COMPLETE WOMENS) TABS Take 1 tablet by mouth daily.    Historical Provider, MD  nicotine (NICODERM CQ) 7 mg/24hr patch Place 1 patch (7 mg total) onto the skin daily. 12/22/14   [name], MD  omeprazole (PRILOSEC) 20 MG capsule Take 1  capsule (20 mg total) by mouth 2 (two) times daily before a meal. 05/26/15   [name], NP  pantoprazole (PROTONIX) 40 MG tablet Take 1 tablet (40 mg total) by mouth daily. 12/22/14   [name], MD  promethazine-dextromethorphan (PROMETHAZINE-DM) 6.25-15 MG/5ML syrup Take 2.5 mLs by mouth 4 (four) times daily as needed for cough. 04/26/15   Okey Regal, PA-C  QUEtiapine (SEROQUEL) 25 MG tablet Take 50 mg by mouth 3 (three) times daily.    Historical Provider, MD  sucralfate (CARAFATE) 1 G tablet Take 1 tablet (1 g total) by mouth 4 (four) times daily -  with meals and at bedtime. 05/26/15   [name], NP   BP 122/96 mmHg  Pulse  87  Temp(Src) 97.4 F (36.3 C) (Oral)  Resp 18  SpO2 100% Physical Exam  Constitutional: She appears well-developed and well-nourished.  Cardiovascular: Normal rate.   Pulmonary/Chest: Effort normal and breath sounds normal. She has no wheezes. She has no rales. She exhibits no tenderness.  Abdominal: Soft. Bowel sounds are normal. She exhibits no distension. There is no tenderness.    ED Course  Procedures (including critical care time) DIAGNOSTIC STUDIES: Oxygen Saturation is 100% on RA, normal by my interpretation.    COORDINATION OF CARE: 8:15 PM-Discussed treatment plan which includes antacid with pt at bedside and pt agreed to plan.   Labs Review Labs Reviewed - No data to display  Imaging Review Dg Chest 2 View  05/26/2015  CLINICAL DATA:  Progressive wheezing and productive cough. Worsens at night. EXAM: CHEST  2 VIEW COMPARISON:  Multiple exams, including 04/26/2015; 01/25/2014 FINDINGS: The patient has early centrilobular emphysema. Lungs appear otherwise clear. Cardiac and mediastinal margins appear normal. No pleural effusion. IMPRESSION: 1. Early centrilobular emphysema. Otherwise, no significant abnormalities are observed. Electronically Signed   By: Van Clines M.D.   On: 05/26/2015 20:36      EKG Interpretation None     patient has been treating herself with over-the-counter Zantac for presumed GERD.  She's been treated for asthma by her primary care physician with albuterol which helps some, what her coughing is always worse at 3 or 4:00 in the morning after being asleep for several hours.  It wakes her from sleep.  I feel that this mode be more a GERD component rather than asthma.  She has no previous history of asthma as a child.  I will treat her with Carafate 3 times a day AC and HS for 1 week, plus start her on Prilosec.  I will also give her referral to GI and community wellness where she is early in establish patient  MDM   Final diagnoses:   Dyspnea  Cough  Gastroesophageal reflux disease without esophagitis  Chronic obstructive pulmonary disease, unspecified COPD type ([place])    I personally performed the services described in this documentation, which was scribed in my presence. The recorded information has been reviewed and is accurate.    [name], NP 05/26/15 2105  Sharlett Iles, MD 05/27/15 1458  [name], NP 07/02/15 [place], MD 07/04/15 [phone]

## 2015-07-11 ENCOUNTER — Emergency Department (HOSPITAL_COMMUNITY)
Admission: EM | Admit: 2015-07-11 | Discharge: 2015-07-11 | Disposition: A | Discharge status: Home / Self Care | Payer: Medicaid Other | Attending: Emergency Medicine | Admitting: Emergency Medicine

## 2015-07-11 ENCOUNTER — Emergency Department (HOSPITAL_COMMUNITY): Payer: Medicaid Other

## 2015-07-11 ENCOUNTER — Encounter (HOSPITAL_COMMUNITY): Payer: Self-pay | Admitting: *Deleted

## 2015-07-11 DIAGNOSIS — Y9301 Activity, walking, marching and hiking: Secondary | ICD-10-CM | POA: Insufficient documentation

## 2015-07-11 DIAGNOSIS — Y998 Other external cause status: Secondary | ICD-10-CM | POA: Insufficient documentation

## 2015-07-11 DIAGNOSIS — F419 Anxiety disorder, unspecified: Secondary | ICD-10-CM | POA: Diagnosis not present

## 2015-07-11 DIAGNOSIS — Z79899 Other long term (current) drug therapy: Secondary | ICD-10-CM | POA: Insufficient documentation

## 2015-07-11 DIAGNOSIS — W108XXA Fall (on) (from) other stairs and steps, initial encounter: Secondary | ICD-10-CM | POA: Insufficient documentation

## 2015-07-11 DIAGNOSIS — F17210 Nicotine dependence, cigarettes, uncomplicated: Secondary | ICD-10-CM | POA: Diagnosis not present

## 2015-07-11 DIAGNOSIS — F319 Bipolar disorder, unspecified: Secondary | ICD-10-CM | POA: Diagnosis not present

## 2015-07-11 DIAGNOSIS — Y9289 Other specified places as the place of occurrence of the external cause: Secondary | ICD-10-CM | POA: Diagnosis not present

## 2015-07-11 DIAGNOSIS — S99922A Unspecified injury of left foot, initial encounter: Secondary | ICD-10-CM | POA: Insufficient documentation

## 2015-07-11 DIAGNOSIS — S93402A Sprain of unspecified ligament of left ankle, initial encounter: Secondary | ICD-10-CM | POA: Diagnosis not present

## 2015-07-11 DIAGNOSIS — F4310 Post-traumatic stress disorder, unspecified: Secondary | ICD-10-CM | POA: Diagnosis not present

## 2015-07-11 DIAGNOSIS — S99912A Unspecified injury of left ankle, initial encounter: Secondary | ICD-10-CM | POA: Diagnosis present

## 2015-07-11 MED ORDER — ACETAMINOPHEN 325 MG PO TABS
650.0000 mg | ORAL_TABLET | Freq: Once | ORAL | Status: AC
  Administered 2015-07-11: 650 mg via ORAL
  Filled 2015-07-11: qty 2

## 2015-07-11 NOTE — Discharge Instructions (Signed)
- Use RICE therapy (rest, ice, compression, elevation). Use crutches as instructed. Alternate tylenol and motrin for pain relief. Follow up with orthopedics, Dr. Rolena Infante, if the pain persists past 2-3 weeks.   Ankle Sprain An ankle sprain is an injury to the strong, fibrous tissues (ligaments) that hold the bones of your ankle joint together.  CAUSES An ankle sprain is usually caused by a fall or by twisting your ankle. Ankle sprains most commonly occur when you step on the outer edge of your foot, and your ankle turns inward. People who participate in sports are more prone to these types of injuries.  SYMPTOMS   Pain in your ankle. The pain may be present at rest or only when you are trying to stand or walk.  Swelling.  Bruising. Bruising may develop immediately or within 1 to 2 days after your injury.  Difficulty standing or walking, particularly when turning corners or changing directions. DIAGNOSIS  Your caregiver will ask you details about your injury and perform a physical exam of your ankle to determine if you have an ankle sprain. During the physical exam, your caregiver will press on and apply pressure to specific areas of your foot and ankle. Your caregiver will try to move your ankle in certain ways. An X-ray exam may be done to be sure a bone was not broken or a ligament did not separate from one of the bones in your ankle (avulsion fracture).  TREATMENT  Certain types of braces can help stabilize your ankle. Your caregiver can make a recommendation for this. Your caregiver may recommend the use of medicine for pain. If your sprain is severe, your caregiver may refer you to a surgeon who helps to restore function to parts of your skeletal system (orthopedist) or a physical therapist. [place] ice to your injury for 1-2 days or as directed by your caregiver. Applying ice helps to reduce inflammation and pain.  Put ice in a plastic bag.  Place a towel between  your skin and the bag.  Leave the ice on for 15-20 minutes at a time, every 2 hours while you are awake.  Only take over-the-counter or prescription medicines for pain, discomfort, or fever as directed by your caregiver.  Elevate your injured ankle above the level of your heart as much as possible for 2-3 days.  If your caregiver recommends crutches, use them as instructed. Gradually put weight on the affected ankle. Continue to use crutches or a cane until you can walk without feeling pain in your ankle.  If you have a plaster splint, wear the splint as directed by your caregiver. Do not rest it on anything harder than a pillow for the first 24 hours. Do not put weight on it. Do not get it wet. You may take it off to take a shower or bath.  You may have been given an elastic bandage to wear around your ankle to provide support. If the elastic bandage is too tight (you have numbness or tingling in your foot or your foot becomes cold and blue), adjust the bandage to make it comfortable.  If you have an air splint, you may blow more air into it or let air out to make it more comfortable. You may take your splint off at night and before taking a shower or bath. Wiggle your toes in the splint several times per day to decrease swelling. SEEK MEDICAL CARE IF:   You have rapidly increasing bruising or  swelling.  Your toes feel extremely cold or you lose feeling in your foot.  Your pain is not relieved with medicine. SEEK IMMEDIATE MEDICAL CARE IF:  Your toes are numb or blue.  You have severe pain that is increasing. MAKE SURE YOU:   Understand these instructions.  Will watch your condition.  Will get help right away if you are not doing well or get worse.   This information is not intended to replace advice given to you by your health care provider. Make sure you discuss any questions you have with your health care provider.   Document Released: 07/30/2005 Document Revised: 08/20/2014  Document Reviewed: 08/11/2011 Elsevier Interactive Patient Education [employer].

## 2015-07-11 NOTE — ED Notes (Signed)
Pt given ice pack for left ankle

## 2015-07-11 NOTE — ED Provider Notes (Signed)
CSN: [id number]     Arrival date & time 07/11/15  [id number] History  By signing my name below, I, Soijett Blue, attest that this documentation has been prepared under the direction and in the presence of Josephina Gip, PA-C Electronically Signed: Soijett Blue, ED Scribe. 07/11/2015. 9:14 AM.   Chief Complaint  Patient presents with  . Foot Pain  . Ankle Pain   The history is provided by the patient. No language interpreter was used.   Felicia Perkins is a 39 y.o. female who presents to the Emergency Department complaining of 8/10, constant, sharp, left foot/ankle pain onset 2 days ago. Pt notes that she fell 2 days ago while walking down her porch steps at night and the pt tripped on the last step. She reports that ambulation and movement worsens her left foot/ankle pain. Pt is having associated symptoms of joint swelling and bruising. She notes that she has tried tylenol/ibuprofen, ace wrap, and ice with mild relief of her symptoms. She last took ibuprofen last night and had no medication this morning. She denies color change, wound, rash, weakness, and any other symptoms.    Past Medical History  Diagnosis Date  . PTSD (post-traumatic stress disorder)   . Alcohol abuse   . Bipolar 1 disorder ([place])   . Anxiety    Past Surgical History  Procedure Laterality Date  . Other surgical history      ovarian surgery  . Foot surgery    . Tubal ligation    . Abdominal hysterectomy     Family History  Problem Relation Age of Onset  . Heart attack Cousin 30    died in 44s of MI  . Hypertension Mother   . Cancer Mother     breast cancer  . Hypertension Father    Social History  Substance Use Topics  . Smoking status: Light Tobacco Smoker -- 1.00 packs/day    Types: Cigarettes  . Smokeless tobacco: Never Used  . Alcohol Use: No     Comment: 4 months    OB History    No data available     Review of Systems  Musculoskeletal: Positive for arthralgias (left foot and ankle).  All other  systems reviewed and are negative.     Allergies  Review of patient's allergies indicates no known allergies.  Home Medications   Prior to Admission medications   Medication Sig Start Date End Date Taking? Authorizing Provider  albuterol (PROVENTIL HFA;VENTOLIN HFA) 108 (90 BASE) MCG/ACT inhaler Inhale 1-2 puffs into the lungs every 6 (six) hours as needed for wheezing or shortness of breath. 04/26/15   Okey Regal, PA-C  benzonatate (TESSALON) 100 MG capsule Take 1 capsule (100 mg total) by mouth every 8 (eight) hours. 04/26/15   Okey Regal, PA-C  famotidine (PEPCID) 20 MG tablet Take 1 tablet (20 mg total) by mouth 2 (two) times daily as needed for heartburn or indigestion. 12/16/14   [name], MD  Multiple Vitamins-Minerals (COMPLETE WOMENS) TABS Take 1 tablet by mouth daily.    Historical Provider, MD  nicotine (NICODERM CQ) 7 mg/24hr patch Place 1 patch (7 mg total) onto the skin daily. 12/22/14   [name], MD  omeprazole (PRILOSEC) 20 MG capsule Take 1 capsule (20 mg total) by mouth 2 (two) times daily before a meal. 05/26/15   [name], NP  pantoprazole (PROTONIX) 40 MG tablet Take 1 tablet (40 mg total) by mouth daily. 12/22/14   [name], MD  promethazine-dextromethorphan (PROMETHAZINE-DM) 6.25-15  MG/5ML syrup Take 2.5 mLs by mouth 4 (four) times daily as needed for cough. 04/26/15   Okey Regal, PA-C  QUEtiapine (SEROQUEL) 25 MG tablet Take 50 mg by mouth 3 (three) times daily.    Historical Provider, MD  sucralfate (CARAFATE) 1 G tablet Take 1 tablet (1 g total) by mouth 4 (four) times daily -  with meals and at bedtime. 05/26/15   [name], NP   BP 134/82 mmHg  Pulse 72  Temp(Src) 98.2 F (36.8 C) (Oral)  Resp 18  Ht 5\' 4"  (1.626 m)  Wt 53.978 kg  BMI 20.42 kg/m2  SpO2 98% Physical Exam  Constitutional: She appears well-developed and well-nourished. No distress.  HENT:  Head: Normocephalic and atraumatic.  Right Ear: External ear normal.  Left  Ear: External ear normal.  Eyes: Conjunctivae are normal. Right eye exhibits no discharge. Left eye exhibits no discharge. No scleral icterus.  Neck: Normal range of motion.  Cardiovascular: Normal rate and intact distal pulses.   Pedal pulse palpable. Cap refill less than 3.  Pulmonary/Chest: Effort normal.  Musculoskeletal:       Left ankle: She exhibits swelling and ecchymosis. She exhibits normal range of motion, no deformity and normal pulse. Tenderness.       Feet:  Generalized tenderness over left ankle and midfoot. Ecchymosis noted over the lateral left ankle. Mild edema localized over the lateral ankle. Retains full range of motion though moderately painful. No obvious deformity. No midfoot laxity.  Neurological: She is alert. Coordination normal.  5 out of 5 strength in the bilateral lower extremities though with significant pain on left ankle testing. Sensation to light touch intact throughout.  Skin: Skin is warm and dry. No erythema. No pallor.  Ecchymosis noted on left lateral ankle. No erythema, lacerations, abrasions or other color changes noted over the foot and ankle.  Psychiatric: She has a normal mood and affect. Her behavior is normal.  Nursing note and vitals reviewed.   ED Course  Procedures (including critical care time) DIAGNOSTIC STUDIES: Oxygen Saturation is 100% on RA, nl by my interpretation.    COORDINATION OF CARE: 9:13 AM Discussed treatment plan with pt at bedside which includes left foot xray, left ankle xray, and pt agreed to plan.    Labs Review Labs Reviewed - No data to display  Imaging Review Dg Ankle Complete Left  07/11/2015  CLINICAL DATA:  Fall with left foot and ankle pain, onset 2 days ago. Initial encounter. EXAM: LEFT ANKLE COMPLETE - 3+ VIEW COMPARISON:  None. FINDINGS: There is no evidence of fracture, dislocation, or joint effusion. There is no evidence of arthropathy or other focal bone abnormality. Soft tissues are unremarkable.  IMPRESSION: Negative. Electronically Signed   By: Monte Fantasia M.D.   On: 07/11/2015 09:47   Dg Foot Complete Left  07/11/2015  CLINICAL DATA:  Sharp, constant left foot and ankle pain, onset 2 days ago. Fall 2 days ago, initial encounter. Swelling. EXAM: LEFT FOOT - COMPLETE 3+ VIEW COMPARISON:  None. FINDINGS: No acute osseous or joint abnormality. IMPRESSION: No acute osseous or joint abnormality. Electronically Signed   By: Lorin Picket M.D.   On: 07/11/2015 09:50   I have personally reviewed and evaluated these images as part of my medical decision-making.   EKG Interpretation None      MDM   Final diagnoses:  Ankle sprain, left, initial encounter   Patient presenting with left ankle pain after a fall down the stairs. Left foot  is neurovascularly intact with FROM. Patient X-Ray negative for obvious fracture or dislocation. Pain managed in ED with Tylenol. Pt is able to ambulate with a steady gait with the assistance of crutches. Brace given and conservative therapy recommended. Discussed RICE therapy, and use of OTC pain relievers. Pt advised to follow up with orthopedics if symptoms persist. Return precautions discussed at bedside and given in discharge paperwork. Pt is stable for discharge.  I personally performed the services described in this documentation, which was scribed in my presence. The recorded information has been reviewed and is accurate.    Josephina Gip, PA-C 07/11/15 1039  Noemi Chapel, MD 07/13/15 1038

## 2015-07-11 NOTE — ED Notes (Signed)
PT reports LT ankle and foot pain increased and unable to walk today due to pain.

## 2015-07-26 ENCOUNTER — Encounter (HOSPITAL_COMMUNITY): Payer: Self-pay | Admitting: Emergency Medicine

## 2015-07-26 ENCOUNTER — Other Ambulatory Visit (HOSPITAL_COMMUNITY)
Admission: RE | Admit: 2015-07-26 | Discharge: 2015-07-26 | Disposition: A | Discharge status: Home / Self Care | Payer: Medicaid Other | Source: Ambulatory Visit | Attending: Emergency Medicine | Admitting: Emergency Medicine

## 2015-07-26 ENCOUNTER — Emergency Department (INDEPENDENT_AMBULATORY_CARE_PROVIDER_SITE_OTHER)
Admission: EM | Admit: 2015-07-26 | Discharge: 2015-07-26 | Disposition: A | Discharge status: Home / Self Care | Payer: Medicaid Other | Source: Home / Self Care | Attending: Emergency Medicine | Admitting: Emergency Medicine

## 2015-07-26 DIAGNOSIS — B379 Candidiasis, unspecified: Secondary | ICD-10-CM | POA: Insufficient documentation

## 2015-07-26 LAB — POCT URINALYSIS DIP (DEVICE)
Bilirubin Urine: NEGATIVE
Glucose, UA: NEGATIVE mg/dL
KETONES UR: NEGATIVE mg/dL
Nitrite: NEGATIVE
PH: 6 (ref 5.0–8.0)
PROTEIN: NEGATIVE mg/dL
Urobilinogen, UA: 0.2 mg/dL (ref 0.0–1.0)

## 2015-07-26 MED ORDER — FLUCONAZOLE 150 MG PO TABS: 150.0000 mg | ORAL_TABLET | Freq: Once | ORAL | Status: DC

## 2015-07-26 NOTE — ED Provider Notes (Signed)
CSN: [id number]     Arrival date & time 07/26/15  [id number] History   First MD Initiated Contact with Patient 07/26/15 1915     Chief Complaint  Patient presents with  . Back Pain  . Dysuria   (Consider location/radiation/quality/duration/timing/severity/associated sxs/prior Treatment) HPI  She is a 39 year old woman here for evaluation of back pain, vaginal discharge, and dysuria. Her symptoms started about 5 days ago with mild bilateral low back pain and vaginal irritation. She reports a creamy discharge and severe vaginal itching. She states she has been rubbing pretty hard when she wipes. Yesterday, she noted some pain with urination. She denies any abdominal pain. No flank pain. No fevers or chills.  Past Medical History  Diagnosis Date  . PTSD (post-traumatic stress disorder)   . Alcohol abuse   . Bipolar 1 disorder ([place])   . Anxiety    Past Surgical History  Procedure Laterality Date  . Other surgical history      ovarian surgery  . Foot surgery    . Tubal ligation    . Abdominal hysterectomy     Family History  Problem Relation Age of Onset  . Heart attack Cousin 30    died in 11s of MI  . Hypertension Mother   . Cancer Mother     breast cancer  . Hypertension Father    Social History  Substance Use Topics  . Smoking status: Light Tobacco Smoker -- 1.00 packs/day    Types: Cigarettes  . Smokeless tobacco: Never Used  . Alcohol Use: No     Comment: 4 months    OB History    No data available     Review of Systems As in history of present illness Allergies  Review of patient's allergies indicates no known allergies.  Home Medications   Prior to Admission medications   Medication Sig Start Date End Date Taking? Authorizing Provider  albuterol (PROVENTIL HFA;VENTOLIN HFA) 108 (90 BASE) MCG/ACT inhaler Inhale 1-2 puffs into the lungs every 6 (six) hours as needed for wheezing or shortness of breath. 04/26/15   Okey Regal, PA-C  benzonatate (TESSALON) 100 MG  capsule Take 1 capsule (100 mg total) by mouth every 8 (eight) hours. 04/26/15   Okey Regal, PA-C  famotidine (PEPCID) 20 MG tablet Take 1 tablet (20 mg total) by mouth 2 (two) times daily as needed for heartburn or indigestion. 12/16/14   [name], MD  fluconazole (DIFLUCAN) 150 MG tablet Take 1 tablet (150 mg total) by mouth once. Take 2nd pill if symptoms not completely resolved in 3 days. 07/26/15   Melony Overly, MD  Multiple Vitamins-Minerals (COMPLETE WOMENS) TABS Take 1 tablet by mouth daily.    Historical Provider, MD  nicotine (NICODERM CQ) 7 mg/24hr patch Place 1 patch (7 mg total) onto the skin daily. 12/22/14   [name], MD  omeprazole (PRILOSEC) 20 MG capsule Take 1 capsule (20 mg total) by mouth 2 (two) times daily before a meal. 05/26/15   [name], NP  pantoprazole (PROTONIX) 40 MG tablet Take 1 tablet (40 mg total) by mouth daily. 12/22/14   [name], MD  promethazine-dextromethorphan (PROMETHAZINE-DM) 6.25-15 MG/5ML syrup Take 2.5 mLs by mouth 4 (four) times daily as needed for cough. 04/26/15   Okey Regal, PA-C  QUEtiapine (SEROQUEL) 25 MG tablet Take 50 mg by mouth 3 (three) times daily.    Historical Provider, MD  sucralfate (CARAFATE) 1 G tablet Take 1 tablet (1 g total) by mouth 4 (four)  times daily -  with meals and at bedtime. 05/26/15   [name], NP   Meds Ordered and Administered this Visit  Medications - No data to display  BP 135/87 mmHg  Pulse 71  Temp(Src) 97.9 F (36.6 C) (Oral)  Resp 16  SpO2 100% No data found.   Physical Exam  Constitutional: She appears well-developed and well-nourished. No distress.  Cardiovascular: Normal rate.   Pulmonary/Chest: Effort normal.  Abdominal: Soft. Bowel sounds are normal. She exhibits no distension. There is no tenderness. There is no rebound and no guarding.  No CVA tenderness.  Musculoskeletal:  Back: No erythema or edema. No vertebral tenderness or step-offs. No point tenderness.    ED Course   Procedures (including critical care time)  Labs Review Labs Reviewed  POCT URINALYSIS DIP (DEVICE) - Abnormal; Notable for the following:    Hgb urine dipstick TRACE (*)    Leukocytes, UA SMALL (*)    All other components within normal limits  URINE CULTURE    Imaging Review No results found.    MDM   1. Yeast infection    History is consistent with a yeast infection. Discussed with patient and will defer pelvic exam at this time. Treat with Diflucan. I suspect her UA is a result of contamination rather than infection. I have sent it for culture just in case. Follow-up as needed.    Melony Overly, MD 07/26/15 [phone]

## 2015-07-26 NOTE — ED Notes (Signed)
The patient presented to the [facility] with a complaint of lower back pain, vaginal soreness, and dysuria x2 days.

## 2015-07-26 NOTE — Discharge Instructions (Signed)
Your symptoms are coming from a yeast infection. Take Diflucan once tonight. Take a second pill in 3 days if you are still having discharge and itching. I sent your urine for culture just to make sure you don't also have a urinary tract infection. We will call if this is positive. Follow-up as needed.

## 2015-07-28 LAB — URINE CULTURE: Culture: 20000

## 2016-02-09 ENCOUNTER — Encounter (HOSPITAL_COMMUNITY): Payer: Self-pay | Admitting: Emergency Medicine

## 2016-02-09 ENCOUNTER — Emergency Department (HOSPITAL_COMMUNITY)
Admission: EM | Admit: 2016-02-09 | Discharge: 2016-02-09 | Disposition: A | Discharge status: Home / Self Care | Payer: Self-pay | Attending: Emergency Medicine | Admitting: Emergency Medicine

## 2016-02-09 DIAGNOSIS — Y999 Unspecified external cause status: Secondary | ICD-10-CM | POA: Insufficient documentation

## 2016-02-09 DIAGNOSIS — J449 Chronic obstructive pulmonary disease, unspecified: Secondary | ICD-10-CM | POA: Insufficient documentation

## 2016-02-09 DIAGNOSIS — Y929 Unspecified place or not applicable: Secondary | ICD-10-CM | POA: Insufficient documentation

## 2016-02-09 DIAGNOSIS — F17210 Nicotine dependence, cigarettes, uncomplicated: Secondary | ICD-10-CM | POA: Insufficient documentation

## 2016-02-09 DIAGNOSIS — Y939 Activity, unspecified: Secondary | ICD-10-CM | POA: Insufficient documentation

## 2016-02-09 DIAGNOSIS — W57XXXA Bitten or stung by nonvenomous insect and other nonvenomous arthropods, initial encounter: Secondary | ICD-10-CM | POA: Insufficient documentation

## 2016-02-09 DIAGNOSIS — S30861A Insect bite (nonvenomous) of abdominal wall, initial encounter: Secondary | ICD-10-CM | POA: Insufficient documentation

## 2016-02-09 DIAGNOSIS — R21 Rash and other nonspecific skin eruption: Secondary | ICD-10-CM

## 2016-02-09 MED ORDER — ALBUTEROL SULFATE HFA 108 (90 BASE) MCG/ACT IN AERS
2.0000 | INHALATION_SPRAY | RESPIRATORY_TRACT | Status: DC | PRN
  Administered 2016-02-09: 2 via RESPIRATORY_TRACT
  Filled 2016-02-09: qty 6.7

## 2016-02-09 MED ORDER — DOXYCYCLINE HYCLATE 100 MG PO CAPS: 100.0000 mg | ORAL_CAPSULE | Freq: Two times a day (BID) | ORAL | Status: DC

## 2016-02-09 MED ORDER — HYDROXYZINE HCL 10 MG PO TABS: 10.0000 mg | ORAL_TABLET | Freq: Four times a day (QID) | ORAL | Status: DC | PRN

## 2016-02-09 NOTE — ED Provider Notes (Signed)
CSN: [id number]     Arrival date & time 02/09/16  1045 History  By signing my name below, I, Hansel Feinstein, attest that this documentation has been prepared under the direction and in the presence of Montine Circle, PA-C. Electronically Signed: Hansel Feinstein, ED Scribe. 02/09/2016. 3:08 PM.    Chief Complaint  Patient presents with  . Rash   The history is provided by the patient. No language interpreter was used.   HPI Comments: Felicia Perkins is a 40 y.o. female who presents to the Emergency Department complaining of pruritic, erythematous, full body rash onset 2 days ago. Pt states she removed a tick from her groin 2 days ago before the onset of her rash. Per pt, she is unsure of how long the tick was latched on her skin. Pt states she has tried Benadryl and cool showers with little to no relief of symptoms. No worsening factors noted. Pt is also requesting a refill of her inhaler at this time. She denies fever, chills, HA, myalgias, changes in appetite.    Past Medical History  Diagnosis Date  . PTSD (post-traumatic stress disorder)   . Alcohol abuse   . Bipolar 1 disorder ([place])   . Anxiety    Past Surgical History  Procedure Laterality Date  . Other surgical history      ovarian surgery  . Foot surgery    . Tubal ligation    . Abdominal hysterectomy     Family History  Problem Relation Age of Onset  . Heart attack Cousin 30    died in 73s of MI  . Hypertension Mother   . Cancer Mother     breast cancer  . Hypertension Father    Social History  Substance Use Topics  . Smoking status: Light Tobacco Smoker -- 1.00 packs/day    Types: Cigarettes  . Smokeless tobacco: Never Used  . Alcohol Use: No     Comment: 4 months    OB History    No data available     Review of Systems  Constitutional: Negative for fever, chills and appetite change.  Musculoskeletal: Negative for myalgias.  Skin: Positive for rash.  Neurological: Negative for headaches.   Allergies  Review  of patient's allergies indicates no known allergies.  Home Medications   Prior to Admission medications   Medication Sig Start Date End Date Taking? Authorizing Provider  albuterol (PROVENTIL HFA;VENTOLIN HFA) 108 (90 BASE) MCG/ACT inhaler Inhale 1-2 puffs into the lungs every 6 (six) hours as needed for wheezing or shortness of breath. 04/26/15   Okey Regal, PA-C  benzonatate (TESSALON) 100 MG capsule Take 1 capsule (100 mg total) by mouth every 8 (eight) hours. 04/26/15   Okey Regal, PA-C  doxycycline (VIBRAMYCIN) 100 MG capsule Take 1 capsule (100 mg total) by mouth 2 (two) times daily. 02/09/16   Montine Circle, PA-C  famotidine (PEPCID) 20 MG tablet Take 1 tablet (20 mg total) by mouth 2 (two) times daily as needed for heartburn or indigestion. 12/16/14   [name], MD  fluconazole (DIFLUCAN) 150 MG tablet Take 1 tablet (150 mg total) by mouth once. Take 2nd pill if symptoms not completely resolved in 3 days. 07/26/15   Melony Overly, MD  Multiple Vitamins-Minerals (COMPLETE WOMENS) TABS Take 1 tablet by mouth daily.    Historical Provider, MD  nicotine (NICODERM CQ) 7 mg/24hr patch Place 1 patch (7 mg total) onto the skin daily. 12/22/14   [name], MD  omeprazole (PRILOSEC) 20  MG capsule Take 1 capsule (20 mg total) by mouth 2 (two) times daily before a meal. 05/26/15   [name], NP  pantoprazole (PROTONIX) 40 MG tablet Take 1 tablet (40 mg total) by mouth daily. 12/22/14   [name], MD  promethazine-dextromethorphan (PROMETHAZINE-DM) 6.25-15 MG/5ML syrup Take 2.5 mLs by mouth 4 (four) times daily as needed for cough. 04/26/15   Okey Regal, PA-C  QUEtiapine (SEROQUEL) 25 MG tablet Take 50 mg by mouth 3 (three) times daily.    Historical Provider, MD  sucralfate (CARAFATE) 1 G tablet Take 1 tablet (1 g total) by mouth 4 (four) times daily -  with meals and at bedtime. 05/26/15   [name], NP   BP 112/89 mmHg  Pulse 81  Temp(Src) 97.7 F (36.5 C) (Oral)  Resp 18  Wt  102 lb 6 oz (46.437 kg)  SpO2 99% Physical Exam Physical Exam  Constitutional: Pt appears well-developed and well-nourished. No distress.  Awake, alert, nontoxic appearance  HENT:  Head: Normocephalic and atraumatic.  Mouth/Throat: Oropharynx is clear and moist. No oropharyngeal exudate.  Eyes: Conjunctivae are normal. No scleral icterus.  Neck: Normal range of motion. Neck supple.  Cardiovascular: Normal rate, regular rhythm and intact distal pulses.   Pulmonary/Chest: Effort normal and breath sounds normal. No respiratory distress. Pt has no wheezes.  Equal chest expansion  Abdominal: Soft. Bowel sounds are normal. Pt exhibits no mass. There is no tenderness. There is no rebound and no guarding.  Musculoskeletal: Normal range of motion. Pt exhibits no edema.  Neurological: Pt is alert.  Speech is clear and goal oriented Moves extremities without ataxia  Skin: Skin is warm and dry. Pt is not diaphoretic. Maculopapular rash on trunk  Psychiatric: Pt has a normal mood and affect.  Nursing note and vitals reviewed.   ED Course  Procedures (including critical care time) DIAGNOSTIC STUDIES: Oxygen Saturation is 99% on RA, normal by my interpretation.    COORDINATION OF CARE: 2:56 PM Discussed treatment plan with pt at bedside which includes doxycycline, benadryl and pt agreed to plan.    MDM   Final diagnoses:  Tick bite  Rash    Patient with rash and recent tick exposure. Afebrile. No myalgias or arthralgias. Will treat prophylactically with doxycycline. Recommend primary care follow-up. Patient also requests a inhaler for her COPD. She denies any active wheezing. States that she has been feeling well regarding her COPD.  I personally performed the services described in this documentation, which was scribed in my presence. The recorded information has been reviewed and is accurate.     Montine Circle, PA-C 02/09/16 1617  Merrily Pew, MD 02/09/16 [phone]

## 2016-02-09 NOTE — Discharge Instructions (Signed)
Rash A rash is a change in the color or texture of the skin. There are many different types of rashes. You may have other problems that accompany your rash. CAUSES   Infections.  Allergic reactions. This can include allergies to pets or foods.  Certain medicines.  Exposure to certain chemicals, soaps, or cosmetics.  Heat.  Exposure to poisonous plants.  Tumors, both cancerous and noncancerous. SYMPTOMS   Redness.  Scaly skin.  Itchy skin.  Dry or cracked skin.  Bumps.  Blisters.  Pain. DIAGNOSIS  Your caregiver may do a physical exam to determine what type of rash you have. A skin sample (biopsy) may be taken and examined under a microscope. TREATMENT  Treatment depends on the type of rash you have. Your caregiver may prescribe certain medicines. For serious conditions, you may need to see a skin doctor (dermatologist). HOME CARE INSTRUCTIONS   Avoid the substance that caused your rash.  Do not scratch your rash. This can cause infection.  You may take cool baths to help stop itching.  Only take over-the-counter or prescription medicines as directed by your caregiver.  Keep all follow-up appointments as directed by your caregiver. SEEK IMMEDIATE MEDICAL CARE IF:  You have increasing pain, swelling, or redness.  You have a fever.  You have new or severe symptoms.  You have body aches, diarrhea, or vomiting.  Your rash is not better after 3 days. MAKE SURE YOU:  Understand these instructions.  Will watch your condition.  Will get help right away if you are not doing well or get worse.   This information is not intended to replace advice given to you by your health care provider. Make sure you discuss any questions you have with your health care provider.   Document Released: 07/20/2002 Document Revised: 08/20/2014 Document Reviewed: 12/15/2014 Elsevier Interactive Patient Education 2016 [place] Information Ticks are insects that  attach themselves to the skin and draw blood for food. There are various types of ticks. Common types include wood ticks and deer ticks. Most ticks live in shrubs and grassy areas. Ticks can climb onto your body when you make contact with leaves or grass where the tick is waiting. The most common places on the body for ticks to attach themselves are the scalp, neck, armpits, waist, and groin. Most tick bites are harmless, but sometimes ticks carry germs that cause diseases. These germs can be spread to a person during the tick's feeding process. The chance of a disease spreading through a tick bite depends on:   The type of tick.  Time of year.   How long the tick is attached.   Geographic location.  HOW CAN YOU PREVENT TICK BITES? Take these steps to help prevent tick bites when you are outdoors:  Wear protective clothing. Long sleeves and long pants are best.   Wear white clothes so you can see ticks more easily.  Tuck your pant legs into your socks.   If walking on a trail, stay in the middle of the trail to avoid brushing against bushes.  Avoid walking through areas with long grass.  Put insect repellent on all exposed skin and along boot tops, pant legs, and sleeve cuffs.   Check clothing, hair, and skin repeatedly and before going inside.   Brush off any ticks that are not attached.  Take a shower or bath as soon as possible after being outdoors.  WHAT IS THE PROPER WAY TO REMOVE A TICK? Ticks should  be removed as soon as possible to help prevent diseases caused by tick bites.  If latex gloves are available, put them on before trying to remove a tick.   Using fine-point tweezers, grasp the tick as close to the skin as possible. You may also use curved forceps or a tick removal tool. Grasp the tick as close to its head as possible. Avoid grasping the tick on its body.  Pull gently with steady upward pressure until the tick lets go. Do not twist the tick or jerk it  suddenly. This may break off the tick's head or mouth parts.  Do not squeeze or crush the tick's body. This could force disease-carrying fluids from the tick into your body.   After the tick is removed, wash the bite area and your hands with soap and water or other disinfectant such as alcohol.  Apply a small amount of antiseptic cream or ointment to the bite site.   Wash and disinfect any instruments that were used.  Do not try to remove a tick by applying a hot match, petroleum jelly, or fingernail polish to the tick. These methods do not work and may increase the chances of disease being spread from the tick bite.  WHEN SHOULD YOU SEEK MEDICAL CARE? Contact your health care provider if you are unable to remove a tick from your skin or if a part of the tick breaks off and is stuck in the skin.  After a tick bite, you need to be aware of signs and symptoms that could be related to diseases spread by ticks. Contact your health care provider if you develop any of the following in the days or weeks after the tick bite:  Unexplained fever.  Rash. A circular rash that appears days or weeks after the tick bite may indicate the possibility of Lyme disease. The rash may resemble a target with a bull's-eye and may occur at a different part of your body than the tick bite.  Redness and swelling in the area of the tick bite.   Tender, swollen lymph glands.   Diarrhea.   Weight loss.   Cough.   Fatigue.   Muscle, joint, or bone pain.   Abdominal pain.   Headache.   Lethargy or a change in your level of consciousness.  Difficulty walking or moving your legs.   Numbness in the legs.   Paralysis.  Shortness of breath.   Confusion.   Repeated vomiting.    This information is not intended to replace advice given to you by your health care provider. Make sure you discuss any questions you have with your health care provider.   Document Released: 07/27/2000 Document  Revised: 08/20/2014 Document Reviewed: 01/07/2013 Elsevier Interactive Patient Education [employer].

## 2016-02-09 NOTE — ED Notes (Signed)
Pt sts pulled tick off 1 week ago and now having rash x 4 days that is generalized red and itchy

## 2016-02-22 ENCOUNTER — Emergency Department (HOSPITAL_COMMUNITY)
Admission: EM | Admit: 2016-02-22 | Discharge: 2016-02-22 | Disposition: A | Discharge status: Home / Self Care | Payer: MEDICAID | Attending: Emergency Medicine | Admitting: Emergency Medicine

## 2016-02-22 ENCOUNTER — Encounter (HOSPITAL_COMMUNITY): Payer: Self-pay

## 2016-02-22 DIAGNOSIS — Z7951 Long term (current) use of inhaled steroids: Secondary | ICD-10-CM | POA: Insufficient documentation

## 2016-02-22 DIAGNOSIS — R112 Nausea with vomiting, unspecified: Secondary | ICD-10-CM | POA: Insufficient documentation

## 2016-02-22 DIAGNOSIS — R1084 Generalized abdominal pain: Secondary | ICD-10-CM | POA: Insufficient documentation

## 2016-02-22 DIAGNOSIS — Z79899 Other long term (current) drug therapy: Secondary | ICD-10-CM | POA: Insufficient documentation

## 2016-02-22 DIAGNOSIS — F17210 Nicotine dependence, cigarettes, uncomplicated: Secondary | ICD-10-CM | POA: Insufficient documentation

## 2016-02-22 DIAGNOSIS — R21 Rash and other nonspecific skin eruption: Secondary | ICD-10-CM | POA: Insufficient documentation

## 2016-02-22 DIAGNOSIS — F319 Bipolar disorder, unspecified: Secondary | ICD-10-CM | POA: Insufficient documentation

## 2016-02-22 LAB — URINALYSIS, ROUTINE W REFLEX MICROSCOPIC
Bilirubin Urine: NEGATIVE
Glucose, UA: NEGATIVE mg/dL
Hgb urine dipstick: NEGATIVE
KETONES UR: NEGATIVE mg/dL
Nitrite: NEGATIVE
PROTEIN: NEGATIVE mg/dL
Specific Gravity, Urine: 1.004 — ABNORMAL LOW (ref 1.005–1.030)
pH: 5 (ref 5.0–8.0)

## 2016-02-22 LAB — COMPREHENSIVE METABOLIC PANEL
ALK PHOS: 119 U/L (ref 38–126)
ALT: 45 U/L (ref 14–54)
AST: 122 U/L — ABNORMAL HIGH (ref 15–41)
Albumin: 4 g/dL (ref 3.5–5.0)
Anion gap: 10 (ref 5–15)
BILIRUBIN TOTAL: 0.3 mg/dL (ref 0.3–1.2)
BUN: <5 mg/dL — ABNORMAL LOW (ref 6–20)
CALCIUM: 9 mg/dL (ref 8.9–10.3)
CO2: 24 mmol/L (ref 22–32)
CREATININE: 0.41 mg/dL — AB (ref 0.44–1.00)
Chloride: 102 mmol/L (ref 101–111)
Glucose, Bld: 102 mg/dL — ABNORMAL HIGH (ref 65–99)
Potassium: 3.8 mmol/L (ref 3.5–5.1)
Sodium: 136 mmol/L (ref 135–145)
TOTAL PROTEIN: 8 g/dL (ref 6.5–8.1)

## 2016-02-22 LAB — CBC
HCT: 43.2 % (ref 36.0–46.0)
Hemoglobin: 14.8 g/dL (ref 12.0–15.0)
MCH: 34.3 pg — ABNORMAL HIGH (ref 26.0–34.0)
MCHC: 34.3 g/dL (ref 30.0–36.0)
MCV: 100 fL (ref 78.0–100.0)
PLATELETS: 266 10*3/uL (ref 150–400)
RBC: 4.32 MIL/uL (ref 3.87–5.11)
RDW: 12.5 % (ref 11.5–15.5)
WBC: 7.4 10*3/uL (ref 4.0–10.5)

## 2016-02-22 LAB — URINE MICROSCOPIC-ADD ON

## 2016-02-22 LAB — WET PREP, GENITAL
Sperm: NONE SEEN
Yeast Wet Prep HPF POC: NONE SEEN

## 2016-02-22 LAB — LIPASE, BLOOD: Lipase: 36 U/L (ref 11–51)

## 2016-02-22 MED ORDER — ONDANSETRON HCL 4 MG/2ML IJ SOLN
4.0000 mg | Freq: Once | INTRAMUSCULAR | Status: AC
  Administered 2016-02-22: 4 mg via INTRAVENOUS
  Filled 2016-02-22: qty 2

## 2016-02-22 MED ORDER — MORPHINE SULFATE (PF) 4 MG/ML IV SOLN
4.0000 mg | Freq: Once | INTRAVENOUS | Status: AC
  Administered 2016-02-22: 4 mg via INTRAVENOUS
  Filled 2016-02-22: qty 1

## 2016-02-22 MED ORDER — METHYLPREDNISOLONE SODIUM SUCC 125 MG IJ SOLR
125.0000 mg | Freq: Once | INTRAMUSCULAR | Status: AC
  Administered 2016-02-22: 125 mg via INTRAVENOUS
  Filled 2016-02-22: qty 2

## 2016-02-22 MED ORDER — DIPHENHYDRAMINE HCL 50 MG/ML IJ SOLN
50.0000 mg | Freq: Once | INTRAMUSCULAR | Status: AC
  Administered 2016-02-22: 50 mg via INTRAVENOUS
  Filled 2016-02-22: qty 1

## 2016-02-22 MED ORDER — DIPHENHYDRAMINE HCL 25 MG PO CAPS: 25.0000 mg | ORAL_CAPSULE | Freq: Three times a day (TID) | ORAL | Status: DC

## 2016-02-22 MED ORDER — FAMOTIDINE 20 MG PO TABS
20.0000 mg | ORAL_TABLET | Freq: Once | ORAL | Status: AC
  Administered 2016-02-22: 20 mg via ORAL
  Filled 2016-02-22: qty 1

## 2016-02-22 MED ORDER — PREDNISONE 10 MG (21) PO TBPK: 10.0000 mg | ORAL_TABLET | Freq: Every day | ORAL | Status: DC

## 2016-02-22 MED ORDER — FAMOTIDINE 20 MG PO TABS: 20.0000 mg | ORAL_TABLET | Freq: Two times a day (BID) | ORAL | Status: DC

## 2016-02-22 MED ORDER — METRONIDAZOLE 500 MG PO TABS
2000.0000 mg | ORAL_TABLET | Freq: Once | ORAL | Status: AC
  Administered 2016-02-22: 2000 mg via ORAL
  Filled 2016-02-22: qty 4

## 2016-02-22 MED ORDER — ONDANSETRON HCL 4 MG PO TABS: 4.0000 mg | ORAL_TABLET | Freq: Four times a day (QID) | ORAL | Status: DC

## 2016-02-22 NOTE — Discharge Instructions (Signed)
Medications: Prednisone, Benadryl, Pepcid, Zofran  Treatment: Take prednisone as prescribed. Take Benadryl and Pepcid as prescribed for 3 days. Take Zofran every 6 hours as needed for nausea and vomiting. Try your best not to itch your rash. Do not take warm baths, as this can make your symptoms worse. Apply Vaseline daily to the areas that are the worst. If any of your tests return positive, you will be called in 2-3 days. You were treated for Trichomonas in the emergency department today. The doxycycline with you are taking should treat chlamydia. If you test positive for gonorrhea, HIV, syphilis, please seek treatment at the health department. Please make any of your sexual partners aware that they should be treated as well to infection and spread of infection.  Follow-up: Please follow up with your primary care provider for follow up and further evaluation and treatment of your symptoms. Please return to the emergency department if you develop any new or worsening symptoms.   Drug Rash A drug rash is a change in the color or texture of the skin that is caused by a drug. It can develop minutes, hours, or days after the person takes the drug. CAUSES This condition is usually caused by a drug allergy. It can also be caused by exposure to sunlight after taking a drug that makes the skin sensitive to light. Drugs that commonly cause rashes include:  Penicillin.  Antibiotic medicines.  Medicines that treat seizures.  Medicines that treat cancer (chemotherapy).  Aspirin and other nonsteroidal anti-inflammatory drugs (NSAIDs).  Injectable dyes that contain iodine.  Insulin. SYMPTOMS Symptoms of this condition include:  Redness.  Tiny bumps.  Peeling.  Itching.  Itchy welts (hives).  Swelling. The rash may appear on a small area of skin or all over the body. DIAGNOSIS To diagnose the condition, your health care provider will do a physical exam. He or she may also order tests to  find out which drug caused the rash. Tests to find the cause of a rash include:  Skin tests.  Blood tests.  Drug challenge. For this test, you stop taking all of the drugs that you do not need to take, and then you start taking them again by adding back one of the drugs at a time. TREATMENT A drug rash may be treated with medicines, including:  Antihistamines. These may be given to relieve itching.  An NSAID. This may be given to reduce swelling and treat pain.  A steroid drug. This may be given to reduce swelling. The rash usually goes away when the person stops taking the drug that caused it. HOME CARE INSTRUCTIONS  Take medicines only as directed by your health care provider.  Let all of your health care providers know about any drug reactions you have had in the past.  If you have hives, take a cool shower or use a cool compress to relieve itchiness. SEEK MEDICAL CARE IF:  You have a fever.  Your rash is not going away.  Your rash gets worse.  Your rash comes back.  You have wheezing or coughing. SEEK IMMEDIATE MEDICAL CARE IF:  You start to have breathing problems.  You start to have shortness of breath.  You face or throat starts to swell.  You have severe weakness with dizziness or fainting.  You have chest pain.   This information is not intended to replace advice given to you by your health care provider. Make sure you discuss any questions you have with your health care  provider.   Document Released: 09/06/2004 Document Revised: 08/20/2014 Document Reviewed: 05/26/2014 Elsevier Interactive Patient Education [employer].

## 2016-02-22 NOTE — ED Notes (Signed)
PA at bedside.

## 2016-02-22 NOTE — ED Notes (Addendum)
Pt c/o increasing generalized rash, abdominal pain, and n/v/d since 6/25.  Pain score 5/10.  Pt was seen at [facility] for rash on 6/29 and sts she was diagnosed w/ "[place] Spotted Fever."  Pt reports taking all medications as prescribed.  Pt reports removing a tick on 6/22.  Pt upset that no blood work was drawn at [facility].

## 2016-02-22 NOTE — ED Notes (Signed)
Discharge instructions, follow up care, and prescriptions reviewed with patient. Patient verbalized understanding. 

## 2016-02-22 NOTE — ED Provider Notes (Signed)
CSN: [id number]     Arrival date & time 02/22/16  1240 History   First MD Initiated Contact with Patient 02/22/16 1349     Chief Complaint  Patient presents with  . Rash     (Consider location/radiation/quality/duration/timing/severity/associated sxs/prior Treatment) HPI Comments: Patient is a 40 year old female who presents with diffuse, full body rash, intermittent generalized abdominal pain, nausea, vomiting, generalized body aches, chills since 02/05/16. Patient was seen on 02/09/2016 and given doxycycline and Vistaril. Patient has been taking her medications as prescribed and will finish her antibiotic today. Patient continues to have a full body, diffuse, maculopapular with blisters, pleuritic rash. The rash does not spare the palms and soles. The rash does seem to spare the face. Patient states that she has felt chills, but no documented fever. Patient states that she has had nausea and vomiting. She has been vomiting at least twice a day. Patient found a tick in her right groin on 02/02/2016. Patient denies any chest pain, shortness of breath, sore throat, dysuria. Patient reports intermittent headache but none at this time. Patient states that her symptoms seem to be improving about one week ago when her rash was clearing up to some degree, however it came back and has been spreading ever since. At discharge, patient reported vaginal itching for the past few days that she thought may be due to a yeast infection from the antibiotic.  Patient is a 40 y.o. female presenting with rash. The history is provided by the patient.  Rash Associated symptoms: abdominal pain (generalized), diarrhea (chronic), joint pain (generalized), myalgias (generalized), nausea and vomiting   Associated symptoms: no fever, no headaches, no shortness of breath and no sore throat     Past Medical History  Diagnosis Date  . PTSD (post-traumatic stress disorder)   . Alcohol abuse   . Bipolar 1 disorder ([place])   .  Anxiety    Past Surgical History  Procedure Laterality Date  . Other surgical history      ovarian surgery  . Foot surgery    . Tubal ligation    . Abdominal hysterectomy     Family History  Problem Relation Age of Onset  . Heart attack Cousin 30    died in 77s of MI  . Hypertension Mother   . Cancer Mother     breast cancer  . Hypertension Father    Social History  Substance Use Topics  . Smoking status: Light Tobacco Smoker -- 1.00 packs/day    Types: Cigarettes  . Smokeless tobacco: Never Used  . Alcohol Use: No     Comment: 4 months    OB History    No data available     Review of Systems  Constitutional: Positive for chills. Negative for fever.  HENT: Negative for facial swelling and sore throat.   Respiratory: Negative for shortness of breath.   Cardiovascular: Negative for chest pain.  Gastrointestinal: Positive for nausea, vomiting, abdominal pain (generalized) and diarrhea (chronic).  Genitourinary: Negative for dysuria.  Musculoskeletal: Positive for myalgias (generalized) and arthralgias (generalized). Negative for back pain.  Skin: Positive for rash. Negative for wound.  Neurological: Negative for headaches.  Psychiatric/Behavioral: The patient is not nervous/anxious.       Allergies  Review of patient's allergies indicates no known allergies.  Home Medications   Prior to Admission medications   Medication Sig Start Date End Date Taking? Authorizing Provider  albuterol (PROVENTIL HFA;VENTOLIN HFA) 108 (90 BASE) MCG/ACT inhaler Inhale 1-2 puffs into the lungs  every 6 (six) hours as needed for wheezing or shortness of breath. 04/26/15  Yes Jeffrey Hedges, PA-C  doxycycline (VIBRAMYCIN) 100 MG capsule Take 1 capsule (100 mg total) by mouth 2 (two) times daily. 02/09/16  Yes Montine Circle, PA-C  hydrOXYzine (ATARAX/VISTARIL) 10 MG tablet Take 1 tablet (10 mg total) by mouth every 6 (six) hours as needed for itching. 02/09/16  Yes Montine Circle, PA-C   Multiple Vitamins-Minerals (COMPLETE WOMENS) TABS Take 1 tablet by mouth daily.   Yes Historical Provider, MD  phenylephrine-shark liver oil-mineral oil-petrolatum (PREPARATION H) 0.25-3-14-71.9 % rectal ointment Place 1 application rectally 2 (two) times daily as needed for hemorrhoids.   Yes Historical Provider, MD  diphenhydrAMINE (BENADRYL) 25 mg capsule Take 1 capsule (25 mg total) by mouth every 8 (eight) hours. 02/22/16   Frederica Kuster, PA-C  famotidine (PEPCID) 20 MG tablet Take 1 tablet (20 mg total) by mouth 2 (two) times daily. 02/22/16   Frederica Kuster, PA-C  Mirtazapine (REMERON PO) Take 1 tablet by mouth at bedtime.    Historical Provider, MD  nicotine (NICODERM CQ) 7 mg/24hr patch Place 1 patch (7 mg total) onto the skin daily. Patient not taking: Reported on 02/22/2016 12/22/14   [name], MD  omeprazole (PRILOSEC) 20 MG capsule Take 1 capsule (20 mg total) by mouth 2 (two) times daily before a meal. Patient not taking: Reported on 02/22/2016 05/26/15   [name], NP  ondansetron (ZOFRAN) 4 MG tablet Take 1 tablet (4 mg total) by mouth every 6 (six) hours. 02/22/16   Frederica Kuster, PA-C  pantoprazole (PROTONIX) 40 MG tablet Take 1 tablet (40 mg total) by mouth daily. Patient not taking: Reported on 02/22/2016 12/22/14   [name], MD  predniSONE (STERAPRED UNI-PAK 21 TAB) 10 MG (21) TBPK tablet Take 1 tablet (10 mg total) by mouth daily. Take 6 tabs by mouth daily  for 2 days, then 5 tabs for 2 days, then 4 tabs for 2 days, then 3 tabs for 2 days, 2 tabs for 2 days, then 1 tab by mouth daily for 2 days 02/22/16   Bea Graff Luian Schumpert, PA-C   BP 117/82 mmHg  Pulse 64  Temp(Src) 98.3 F (36.8 C) (Oral)  Resp 16  Ht 5' 4.5" (1.638 m)  Wt 50.803 kg  BMI 18.93 kg/m2  SpO2 100% Physical Exam  Constitutional: She appears well-developed and well-nourished. No distress.  HENT:  Head: Normocephalic and atraumatic.  Mouth/Throat: Oropharynx is clear and moist. No oropharyngeal  exudate.  Eyes: Conjunctivae are normal. Pupils are equal, round, and reactive to light. Right eye exhibits no discharge. Left eye exhibits no discharge. No scleral icterus.  Neck: Normal range of motion. Neck supple. No thyromegaly present.  Cardiovascular: Normal rate, regular rhythm, normal heart sounds and intact distal pulses.  Exam reveals no gallop and no friction rub.   No murmur heard. Pulmonary/Chest: Effort normal and breath sounds normal. No stridor. No respiratory distress. She has no wheezes. She has no rales.  Abdominal: Soft. Bowel sounds are normal. She exhibits no distension. There is generalized tenderness (mild). There is no rebound, no guarding, no tenderness at McBurney's point and negative Murphy's sign.  Genitourinary: There is no rash, tenderness or lesion on the right labia. There is no rash, tenderness or lesion on the left labia. Uterus is not tender. Cervix exhibits discharge (white). Cervix exhibits no motion tenderness. Right adnexum displays no mass, no tenderness and no fullness. Left adnexum displays no mass, no  tenderness and no fullness. No tenderness or bleeding in the vagina. Vaginal discharge (white) found.  Musculoskeletal: She exhibits no edema.  Lymphadenopathy:    She has no cervical adenopathy.  Neurological: She is alert. Coordination normal.  Skin: Skin is warm and dry. Rash noted. She is not diaphoretic. No pallor.  Diffuse rash; present on palms and soles, spare face; maculopapular, skin sloughing in many areas (worse on dorsum of hand), raised papules on palms;  healing tick bite right groin as well as nodule with rounding dry-looking skin and central punctate wound medial right thigh; no oral lesions noted  Psychiatric: She has a normal mood and affect.  Nursing note and vitals reviewed.   ED Course  Procedures (including critical care time) Labs Review Labs Reviewed  WET PREP, GENITAL - Abnormal; Notable for the following:    Trich, Wet Prep  PRESENT (*)    Clue Cells Wet Prep HPF POC PRESENT (*)    WBC, Wet Prep HPF POC MANY (*)    All other components within normal limits  COMPREHENSIVE METABOLIC PANEL - Abnormal; Notable for the following:    Glucose, Bld 102 (*)    BUN <5 (*)    Creatinine, Ser 0.41 (*)    AST 122 (*)    All other components within normal limits  CBC - Abnormal; Notable for the following:    MCH 34.3 (*)    All other components within normal limits  URINALYSIS, ROUTINE W REFLEX MICROSCOPIC (NOT AT [facility]) - Abnormal; Notable for the following:    APPearance CLOUDY (*)    Specific Gravity, Urine 1.004 (*)    Leukocytes, UA TRACE (*)    All other components within normal limits  URINE MICROSCOPIC-ADD ON - Abnormal; Notable for the following:    Squamous Epithelial / LPF 6-30 (*)    Bacteria, UA FEW (*)    All other components within normal limits  URINE CULTURE  LIPASE, BLOOD  ROCKY MTN SPOTTED FVR ABS PNL(IGG+IGM)  RPR  HIV ANTIBODY (ROUTINE TESTING)  GC/CHLAMYDIA PROBE AMP ([facility]) NOT AT [facility]    Imaging Review No results found. I have personally reviewed and evaluated these images and lab results as part of my medical decision-making.   EKG Interpretation None      MDM   Most likely allergic reaction and skin desquamation due to itching. CBC unremarkable. CMP shows BUN <5, creatinine 0.41, AST 122 (which is decreased from past). RMSF titers pending. UA shows few bacteria, trace leukocytes, 6-30 squamous epithelial cells. Urine culture sent. Blood prep shows Trichomonas, many WBCs. GC/Chlamydia culture sent, HIV, RPR sent. Patient evaluated by Dr. Venora Maples who recommends discharging patient home with prednisone, Benadryl, Pepcid. Patient also advised to refrain from itching and use Vaseline on the worst of areas. Also treated patient with a 1 time dose of Flagyl 2 g in ED to treat Trichomonas. Patient to be called in 2-3 days if any other of her results return positive and advised to seek  treatment at the health department if any others return positive and advised her sexual partners to be treated as well. Patient to follow-up with her PCP as soon as possible for recheck symptoms. Patient understands and agrees with plan. Patient vitals stable throughout ED course and discharged in satisfactory condition.  Final diagnoses:  Rash  Generalized abdominal pain  Non-intractable vomiting with nausea, vomiting of unspecified type        Frederica Kuster, PA-C 02/22/16 2018  Jola Schmidt, MD  02/23/16 1214 

## 2016-02-23 LAB — GC/CHLAMYDIA PROBE AMP (~~LOC~~) NOT AT ARMC
Chlamydia: NEGATIVE
Neisseria Gonorrhea: NEGATIVE

## 2016-02-23 LAB — ROCKY MTN SPOTTED FVR ABS PNL(IGG+IGM)
RMSF IgG: NEGATIVE
RMSF IgM: 1.44 index — ABNORMAL HIGH (ref 0.00–0.89)

## 2016-02-23 LAB — HIV ANTIBODY (ROUTINE TESTING W REFLEX): HIV Screen 4th Generation wRfx: NONREACTIVE

## 2016-02-23 LAB — RPR: RPR Ser Ql: NONREACTIVE

## 2016-02-24 LAB — URINE CULTURE
Culture: 20000 — AB
Special Requests: NORMAL

## 2016-02-25 ENCOUNTER — Telehealth: Payer: Self-pay | Admitting: *Deleted

## 2016-02-25 NOTE — Telephone Encounter (Signed)
Post ED Visit - Positive Culture Follow-up  Culture report reviewed by antimicrobial stewardship pharmacist:  []  Elenor Quinones, Pharm.D. []  Heide Guile, Pharm.D., BCPS []  Parks Neptune, Pharm.D. []  Alycia Rossetti, Pharm.D., BCPS []  Miguel Barrera, Pharm.D., BCPS, AAHIVP []  Legrand Como, Pharm.D., BCPS, AAHIVP []  Milus Glazier, Pharm.D. [x]  Stephens November, Pharm.D.  Positive urine culture No urinary symptoms and no further patient follow-up is required at this time.  Felicia Perkins 02/25/2016, 1:21 PM

## 2016-04-17 ENCOUNTER — Encounter (HOSPITAL_COMMUNITY): Payer: Self-pay | Admitting: Emergency Medicine

## 2016-04-17 ENCOUNTER — Emergency Department (HOSPITAL_COMMUNITY)
Admission: EM | Admit: 2016-04-17 | Discharge: 2016-04-17 | Disposition: A | Discharge status: Home / Self Care | Payer: Medicaid Other | Attending: Physician Assistant | Admitting: Physician Assistant

## 2016-04-17 DIAGNOSIS — Z79899 Other long term (current) drug therapy: Secondary | ICD-10-CM | POA: Insufficient documentation

## 2016-04-17 DIAGNOSIS — R110 Nausea: Secondary | ICD-10-CM | POA: Insufficient documentation

## 2016-04-17 DIAGNOSIS — E119 Type 2 diabetes mellitus without complications: Secondary | ICD-10-CM | POA: Insufficient documentation

## 2016-04-17 DIAGNOSIS — F17210 Nicotine dependence, cigarettes, uncomplicated: Secondary | ICD-10-CM | POA: Insufficient documentation

## 2016-04-17 DIAGNOSIS — R21 Rash and other nonspecific skin eruption: Secondary | ICD-10-CM | POA: Insufficient documentation

## 2016-04-17 MED ORDER — DIPHENHYDRAMINE HCL 50 MG/ML IJ SOLN
25.0000 mg | Freq: Once | INTRAMUSCULAR | Status: AC
  Administered 2016-04-17: 25 mg via INTRAMUSCULAR
  Filled 2016-04-17: qty 1

## 2016-04-17 MED ORDER — HYDROXYZINE HCL 10 MG PO TABS: 10.0000 mg | ORAL_TABLET | Freq: Four times a day (QID) | ORAL | 0 refills | Status: DC | PRN

## 2016-04-17 MED ORDER — DIPHENHYDRAMINE HCL 50 MG/ML IJ SOLN
25.0000 mg | Freq: Once | INTRAMUSCULAR | Status: DC
  Filled 2016-04-17: qty 1

## 2016-04-17 MED ORDER — FAMOTIDINE IN NACL 20-0.9 MG/50ML-% IV SOLN
20.0000 mg | Freq: Once | INTRAVENOUS | Status: DC
  Filled 2016-04-17: qty 50

## 2016-04-17 MED ORDER — HYDROXYZINE HCL 25 MG PO TABS
25.0000 mg | ORAL_TABLET | Freq: Once | ORAL | Status: AC
  Administered 2016-04-17: 25 mg via ORAL
  Filled 2016-04-17: qty 1

## 2016-04-17 MED ORDER — PREDNISONE 10 MG (21) PO TBPK: 10.0000 mg | ORAL_TABLET | Freq: Every day | ORAL | 0 refills | Status: DC

## 2016-04-17 MED ORDER — METHYLPREDNISOLONE SODIUM SUCC 125 MG IJ SOLR
125.0000 mg | Freq: Once | INTRAMUSCULAR | Status: DC
  Administered 2016-04-17: 125 mg via INTRAVENOUS
  Filled 2016-04-17: qty 2

## 2016-04-17 MED ORDER — PREDNISONE 20 MG PO TABS
60.0000 mg | ORAL_TABLET | Freq: Once | ORAL | Status: AC
  Administered 2016-04-17: 60 mg via ORAL
  Filled 2016-04-17: qty 3

## 2016-04-17 NOTE — ED Provider Notes (Signed)
[facility] DEPT Provider Note   CSN: [id number] Arrival date & time: 04/17/16  1220     History   Chief Complaint Chief Complaint  Patient presents with  . Rash    HPI Felicia Perkins is a 40 y.o. female.  Patient is a 40 year old female with past medical history of bipolar disorder, diabetes, alcohol abuse and PTSD who presents the ED with complaint of rash, onset 3 days. Patient reports she was seen initially in July with initial onset of rash after she was bit by a tick. She states she was diagnosed with [facility] spotted fever and was given doxycycline. She states having an allergic reaction to the antibiotic and notes she was treated in the ED and her rash improved after taking steroids and Benadryl. Patient reports her rash was significantly improving until 3 days ago when it began to worsen after she finished her steroid rx. She reports having a red painful itchy rash all over her body. She states she has been taking Benadryl at home without relief. Denies any other new tick bites, denies hiking in the woods recently. Denies any new medications, soaps, lotions, detergents or linens. Endorses associated nausea. Denies fever, chills, chest pain, difficulty breathing, abdominal pain, vomiting, diarrhea, numbness, tingling, weakness, syncope.       Past Medical History:  Diagnosis Date  . Alcohol abuse   . Anxiety   . Bipolar 1 disorder ([place])   . PTSD (post-traumatic stress disorder)     Patient Active Problem List   Diagnosis Date Noted  . Elevated liver enzymes 12/22/2014  . Abdominal pain, epigastric 12/16/2014  . Chest pain with moderate risk for cardiac etiology 12/15/2014  . Alcohol dependence ([place]) 12/16/2012    Class: Acute    Past Surgical History:  Procedure Laterality Date  . ABDOMINAL HYSTERECTOMY    . FOOT SURGERY    . OTHER SURGICAL HISTORY     ovarian surgery  . TUBAL LIGATION      OB History    No data available       Home  Medications    Prior to Admission medications   Medication Sig Start Date End Date Taking? Authorizing Provider  albuterol (PROVENTIL HFA;VENTOLIN HFA) 108 (90 BASE) MCG/ACT inhaler Inhale 1-2 puffs into the lungs every 6 (six) hours as needed for wheezing or shortness of breath. 04/26/15  Yes Okey Regal, PA-C  diphenhydrAMINE (BENADRYL) 25 mg capsule Take 1 capsule (25 mg total) by mouth every 8 (eight) hours. Patient taking differently: Take 25 mg by mouth every 8 (eight) hours as needed for itching or allergies.  02/22/16  Yes Alexandra M Law, PA-C  Mirtazapine (REMERON PO) Take 1 tablet by mouth at bedtime.   Yes Historical Provider, MD  phenylephrine-shark liver oil-mineral oil-petrolatum (PREPARATION H) 0.25-3-14-71.9 % rectal ointment Place 1 application rectally 2 (two) times daily as needed for hemorrhoids.   Yes Historical Provider, MD  doxycycline (VIBRAMYCIN) 100 MG capsule Take 1 capsule (100 mg total) by mouth 2 (two) times daily. Patient not taking: Reported on 04/17/2016 02/09/16   Montine Circle, PA-C  famotidine (PEPCID) 20 MG tablet Take 1 tablet (20 mg total) by mouth 2 (two) times daily. Patient not taking: Reported on 04/17/2016 02/22/16   Frederica Kuster, PA-C  hydrOXYzine (ATARAX/VISTARIL) 10 MG tablet Take 1 tablet (10 mg total) by mouth every 6 (six) hours as needed for itching. 04/17/16   Nona Dell, PA-C  nicotine (NICODERM CQ) 7 mg/24hr patch Place 1 patch (  7 mg total) onto the skin daily. Patient not taking: Reported on 02/22/2016 12/22/14   [name], MD  omeprazole (PRILOSEC) 20 MG capsule Take 1 capsule (20 mg total) by mouth 2 (two) times daily before a meal. Patient not taking: Reported on 02/22/2016 05/26/15   [name], NP  ondansetron (ZOFRAN) 4 MG tablet Take 1 tablet (4 mg total) by mouth every 6 (six) hours. Patient not taking: Reported on 04/17/2016 02/22/16   Bea Graff Law, PA-C  pantoprazole (PROTONIX) 40 MG tablet Take 1 tablet (40 mg total)  by mouth daily. Patient not taking: Reported on 02/22/2016 12/22/14   [name], MD  predniSONE (STERAPRED UNI-PAK 21 TAB) 10 MG (21) TBPK tablet Take 1 tablet (10 mg total) by mouth daily. Take 6 tabs by mouth daily  for 6 days, then 5 tabs for 5 days, then 4 tabs for 4 days, then 3 tabs for 3 days, 2 tabs for 2 days, then 1 tab by mouth daily for 1 days 04/17/16   Nona Dell, PA-C    Family History Family History  Problem Relation Age of Onset  . Hypertension Mother   . Cancer Mother     breast cancer  . Hypertension Father   . Heart attack Cousin 30    died in 37s of MI    Social History Social History  Substance Use Topics  . Smoking status: Light Tobacco Smoker    Packs/day: 1.00    Types: Cigarettes  . Smokeless tobacco: Never Used  . Alcohol use No     Comment: 4 months      Allergies   Review of patient's allergies indicates no known allergies.   Review of Systems Review of Systems  Gastrointestinal: Positive for nausea.  Skin: Positive for rash (itchy).  All other systems reviewed and are negative.    Physical Exam Updated Vital Signs BP 127/93 (BP Location: Left Arm)   Pulse 84   Temp 98 F (36.7 C) (Oral)   Resp 18   Ht 5\' 4"  (1.626 m)   Wt 52.2 kg   SpO2 96%   BMI 19.74 kg/m   Physical Exam  Constitutional: She is oriented to person, place, and time. She appears well-developed and well-nourished.  HENT:  Head: Normocephalic and atraumatic.  Mouth/Throat: Uvula is midline, oropharynx is clear and moist and mucous membranes are normal. No oral lesions. No trismus in the jaw. No uvula swelling. No oropharyngeal exudate, posterior oropharyngeal edema, posterior oropharyngeal erythema or tonsillar abscesses. No tonsillar exudate.  Eyes: Conjunctivae and EOM are normal. Pupils are equal, round, and reactive to light. Right eye exhibits no discharge. Left eye exhibits no discharge. No scleral icterus.  Neck: Normal range of motion. Neck  supple.  Cardiovascular: Normal rate, regular rhythm, normal heart sounds and intact distal pulses.   Pulmonary/Chest: Effort normal and breath sounds normal. No respiratory distress. She has no wheezes. She has no rales. She exhibits no tenderness.  Abdominal: Soft. Bowel sounds are normal. She exhibits no distension and no mass. There is no tenderness. There is no rebound and no guarding. No hernia.  Musculoskeletal: Normal range of motion. She exhibits no edema.  Lymphadenopathy:    She has no cervical adenopathy.  Neurological: She is alert and oriented to person, place, and time.  Skin: Skin is warm and dry. Capillary refill takes less than 2 seconds. Rash noted.  Diffuse maculopapular erythematous blanching rash noted to truck, BUE and BLE with desquamation present around older  lesion which are darker in pigmentation. Desquamation noted to soles of feet, no rash on palms. No burrows, vesicles, pustules or drainage noted. Excoriations present.  Nursing note and vitals reviewed.    ED Treatments / Results  Labs (all labs ordered are listed, but only abnormal results are displayed) Labs Reviewed - No data to display  EKG  EKG Interpretation None       Radiology No results found.  Procedures Procedures (including critical care time)  Medications Ordered in ED Medications  diphenhydrAMINE (BENADRYL) injection 25 mg (25 mg Intramuscular Given 04/17/16 1443)  predniSONE (DELTASONE) tablet 60 mg (60 mg Oral Given 04/17/16 1443)  hydrOXYzine (ATARAX/VISTARIL) tablet 25 mg (25 mg Oral Given 04/17/16 1443)     Initial Impression / Assessment and Plan / ED Course  I have reviewed the triage vital signs and the nursing notes.  Pertinent labs & imaging results that were available during my care of the patient were reviewed by me and considered in my medical decision making (see chart for details).  Clinical Course    Patient presents with worsening rash that she has had for over the  past month. Patient notes she was seen in the ED initially for rash on 6/29, was dx with [place] spotted fever and sent home on doxycycline, reported having an allergic reaction, was seen in the ED again on 7/12 for allergic reaction and sent home on steroids and benadryl. Improvement with steroids but rash worsened after stopping steroids. VSS. Exam revealed diffuse maculopapular erythematous blanching rash noted to truck with desquamation present. Discussed pt with Dr. Thomasene Lot who evaluated the pt. Rash appears to resemble eczema. Patient denies any difficulty breathing or swallowing.  Pt has a patent airway without stridor and is handling secretions without difficulty; no angioedema. No blisters, no pustules, no warmth, no draining sinus tracts, no superficial abscesses, no bullous impetigo, no vesicles, no desquamation, no target lesions with dusky purpura or a central bulla. Not tender to touch. No concern for superimposed infection. No concern for SJS, TEN, TSS, tick borne illness, syphilis or other life-threatening condition. Will discharge home with long course of steroids, vistaril and recommend Benadryl as needed for pruritis. Patient given information to follow-up with dermatology. Discussed return precautions with patient.   Final Clinical Impressions(s) / ED Diagnoses   Final diagnoses:  Rash    New Prescriptions New Prescriptions   HYDROXYZINE (ATARAX/VISTARIL) 10 MG TABLET    Take 1 tablet (10 mg total) by mouth every 6 (six) hours as needed for itching.   PREDNISONE (STERAPRED UNI-PAK 21 TAB) 10 MG (21) TBPK TABLET    Take 1 tablet (10 mg total) by mouth daily. Take 6 tabs by mouth daily  for 6 days, then 5 tabs for 5 days, then 4 tabs for 4 days, then 3 tabs for 3 days, 2 tabs for 2 days, then 1 tab by mouth daily for 1 days     Nona Dell, PA-C 04/17/16 [place], MD 04/20/16 1104

## 2016-04-17 NOTE — ED Triage Notes (Signed)
Patient was seen at [facility] a few months ago for a generalized rash; dx with [facility] Fever. Patient began to have an allergic reaction to the medication soon after and seen at [facility]. The rash began to clear up. However, 3 days ago the rash started to reoccur. States the rash is itchy and painful.

## 2016-04-17 NOTE — Discharge Instructions (Signed)
Take your prescriptions as prescribed until completed. Follow up with the dermatology clinic listed above. Please return to the Emergency Department if symptoms worsen or new onset of fever, difficulty breathing, facial swelling, drainage, abdominal pain, vomiting, chest pain.

## 2016-08-29 ENCOUNTER — Ambulatory Visit: Payer: Self-pay | Admitting: Family Medicine

## 2016-09-04 ENCOUNTER — Ambulatory Visit: Payer: Self-pay | Admitting: Family Medicine

## 2016-09-04 ENCOUNTER — Encounter: Payer: Self-pay | Admitting: Family Medicine

## 2016-09-04 ENCOUNTER — Other Ambulatory Visit: Payer: Self-pay | Admitting: Family Medicine

## 2016-09-04 ENCOUNTER — Ambulatory Visit: Payer: Medicaid Other | Attending: Family Medicine | Admitting: Family Medicine

## 2016-09-04 VITALS — BP 139/93 | HR 100 | Temp 98.7°F | Ht 64.0 in | Wt 100.8 lb

## 2016-09-04 DIAGNOSIS — K2960 Other gastritis without bleeding: Secondary | ICD-10-CM | POA: Insufficient documentation

## 2016-09-04 DIAGNOSIS — K2990 Gastroduodenitis, unspecified, without bleeding: Secondary | ICD-10-CM | POA: Diagnosis not present

## 2016-09-04 DIAGNOSIS — R197 Diarrhea, unspecified: Secondary | ICD-10-CM | POA: Diagnosis present

## 2016-09-04 DIAGNOSIS — Z720 Tobacco use: Secondary | ICD-10-CM | POA: Diagnosis not present

## 2016-09-04 DIAGNOSIS — K921 Melena: Secondary | ICD-10-CM | POA: Diagnosis not present

## 2016-09-04 DIAGNOSIS — F319 Bipolar disorder, unspecified: Secondary | ICD-10-CM | POA: Diagnosis not present

## 2016-09-04 DIAGNOSIS — F4310 Post-traumatic stress disorder, unspecified: Secondary | ICD-10-CM | POA: Diagnosis not present

## 2016-09-04 DIAGNOSIS — R110 Nausea: Secondary | ICD-10-CM | POA: Diagnosis present

## 2016-09-04 DIAGNOSIS — J438 Other emphysema: Secondary | ICD-10-CM

## 2016-09-04 DIAGNOSIS — K2970 Gastritis, unspecified, without bleeding: Secondary | ICD-10-CM | POA: Insufficient documentation

## 2016-09-04 DIAGNOSIS — R630 Anorexia: Secondary | ICD-10-CM | POA: Diagnosis not present

## 2016-09-04 DIAGNOSIS — Z90710 Acquired absence of both cervix and uterus: Secondary | ICD-10-CM | POA: Insufficient documentation

## 2016-09-04 DIAGNOSIS — B354 Tinea corporis: Secondary | ICD-10-CM

## 2016-09-04 DIAGNOSIS — F1010 Alcohol abuse, uncomplicated: Secondary | ICD-10-CM

## 2016-09-04 DIAGNOSIS — B86 Scabies: Secondary | ICD-10-CM | POA: Diagnosis not present

## 2016-09-04 DIAGNOSIS — Z7951 Long term (current) use of inhaled steroids: Secondary | ICD-10-CM | POA: Insufficient documentation

## 2016-09-04 DIAGNOSIS — J449 Chronic obstructive pulmonary disease, unspecified: Secondary | ICD-10-CM

## 2016-09-04 DIAGNOSIS — R634 Abnormal weight loss: Secondary | ICD-10-CM | POA: Diagnosis not present

## 2016-09-04 HISTORY — DX: Chronic obstructive pulmonary disease, unspecified: J44.9

## 2016-09-04 HISTORY — DX: Gastritis, unspecified, without bleeding: K29.70

## 2016-09-04 HISTORY — DX: Tobacco use: Z72.0

## 2016-09-04 LAB — COMPLETE METABOLIC PANEL WITH GFR
ALBUMIN: 4.1 g/dL (ref 3.6–5.1)
ALT: 43 U/L — AB (ref 6–29)
AST: 144 U/L — ABNORMAL HIGH (ref 10–30)
Alkaline Phosphatase: 125 U/L — ABNORMAL HIGH (ref 33–115)
BILIRUBIN TOTAL: 2.3 mg/dL — AB (ref 0.2–1.2)
BUN: 4 mg/dL — ABNORMAL LOW (ref 7–25)
CALCIUM: 9.9 mg/dL (ref 8.6–10.2)
CO2: 21 mmol/L (ref 20–31)
CREATININE: 0.43 mg/dL — AB (ref 0.50–1.10)
Chloride: 100 mmol/L (ref 98–110)
Glucose, Bld: 103 mg/dL — ABNORMAL HIGH (ref 65–99)
Potassium: 3.5 mmol/L (ref 3.5–5.3)
Sodium: 136 mmol/L (ref 135–146)
TOTAL PROTEIN: 7.5 g/dL (ref 6.1–8.1)

## 2016-09-04 LAB — CBC WITH DIFFERENTIAL/PLATELET
BASOS ABS: 74 {cells}/uL (ref 0–200)
BASOS PCT: 1 %
EOS ABS: 74 {cells}/uL (ref 15–500)
Eosinophils Relative: 1 %
HEMATOCRIT: 43.1 % (ref 35.0–45.0)
HEMOGLOBIN: 14.3 g/dL (ref 11.7–15.5)
Lymphocytes Relative: 33 %
Lymphs Abs: 2442 cells/uL (ref 850–3900)
MCH: 33.6 pg — AB (ref 27.0–33.0)
MCHC: 33.2 g/dL (ref 32.0–36.0)
MCV: 101.2 fL — AB (ref 80.0–100.0)
MONO ABS: 518 {cells}/uL (ref 200–950)
MONOS PCT: 7 %
MPV: 11.3 fL (ref 7.5–12.5)
NEUTROS ABS: 4292 {cells}/uL (ref 1500–7800)
Neutrophils Relative %: 58 %
Platelets: 174 10*3/uL (ref 140–400)
RBC: 4.26 MIL/uL (ref 3.80–5.10)
RDW: 13.3 % (ref 11.0–15.0)
WBC: 7.4 10*3/uL (ref 3.8–10.8)

## 2016-09-04 LAB — TSH: TSH: 3.74 mIU/L

## 2016-09-04 MED ORDER — ALBUTEROL SULFATE HFA 108 (90 BASE) MCG/ACT IN AERS: 1.0000 | INHALATION_SPRAY | Freq: Four times a day (QID) | RESPIRATORY_TRACT | 3 refills | Status: DC | PRN

## 2016-09-04 MED ORDER — BUDESONIDE-FORMOTEROL FUMARATE 160-4.5 MCG/ACT IN AERO: 2.0000 | INHALATION_SPRAY | Freq: Two times a day (BID) | RESPIRATORY_TRACT | 3 refills | Status: DC

## 2016-09-04 MED ORDER — PERMETHRIN 5 % EX CREA: 1.0000 "application " | TOPICAL_CREAM | Freq: Once | CUTANEOUS | 0 refills | Status: AC

## 2016-09-04 MED ORDER — OMEPRAZOLE 20 MG PO CPDR: 20.0000 mg | DELAYED_RELEASE_CAPSULE | Freq: Every day | ORAL | 3 refills | Status: DC

## 2016-09-04 NOTE — Patient Instructions (Signed)

## 2016-09-04 NOTE — Progress Notes (Signed)
Subjective:  Patient ID: Felicia Perkins, female    DOB: June 06, 1976  Age: 41 y.o. MRN: [id number]  CC: Abdominal Pain; Rectal Bleeding; Melena; Weight Loss; Anorexia; Nausea; Rash (all over body with itching); COPD; and Diarrhea (3-4 times daily for one month)   HPI Felicia Perkins is a 41 year old female with a history of bipolar disorder, gastritis, COPD, tobacco abuse who presents today complaining of epigastric abdominal pain, nausea and anorexia which has been going on for some time. She has had a 25 pound weight loss she states in the last 1 year. She has also noticed melena and describes her stool as occasionally dark in color but denies bright red blood per rectum. She denies hematemesis. Dizziness or syncope. Chart review she should be on omeprazole which she has not been taking. Endorses drinking sixpacks of beer daily (which is mostly on the weekends)  She also has a pruritic rash which is generalized and denies allergies to foods, soaps or creams.  Does have COPD and is having to use her albuterol inhaler close to daily. Denies chest pains but does have occasional shortness of breath.  Continues to smoke and is not willing to quit at this time.  Past Medical History:  Diagnosis Date  . Alcohol abuse   . Anxiety   . Bipolar 1 disorder ([place])   . PTSD (post-traumatic stress disorder)     Past Surgical History:  Procedure Laterality Date  . ABDOMINAL HYSTERECTOMY    . FOOT SURGERY    . OTHER SURGICAL HISTORY     ovarian surgery  . TUBAL LIGATION      No Known Allergies   Outpatient Medications Prior to Visit  Medication Sig Dispense Refill  . Mirtazapine (REMERON PO) Take 1 tablet by mouth at bedtime.    . phenylephrine-shark liver oil-mineral oil-petrolatum (PREPARATION H) 0.25-3-14-71.9 % rectal ointment Place 1 application rectally 2 (two) times daily as needed for hemorrhoids.    Marland Kitchen albuterol (PROVENTIL HFA;VENTOLIN HFA) 108 (90 BASE) MCG/ACT inhaler Inhale  1-2 puffs into the lungs every 6 (six) hours as needed for wheezing or shortness of breath. 1 Inhaler 0  . omeprazole (PRILOSEC) 20 MG capsule Take 1 capsule (20 mg total) by mouth 2 (two) times daily before a meal. 40 capsule 0  . diphenhydrAMINE (BENADRYL) 25 mg capsule Take 1 capsule (25 mg total) by mouth every 8 (eight) hours. (Patient not taking: Reported on 09/04/2016) 10 capsule 0  . famotidine (PEPCID) 20 MG tablet Take 1 tablet (20 mg total) by mouth 2 (two) times daily. (Patient not taking: Reported on 04/17/2016) 6 tablet 0  . hydrOXYzine (ATARAX/VISTARIL) 10 MG tablet Take 1 tablet (10 mg total) by mouth every 6 (six) hours as needed for itching. (Patient not taking: Reported on 09/04/2016) 30 tablet 0  . nicotine (NICODERM CQ) 7 mg/24hr patch Place 1 patch (7 mg total) onto the skin daily. (Patient not taking: Reported on 02/22/2016) 28 patch 0  . ondansetron (ZOFRAN) 4 MG tablet Take 1 tablet (4 mg total) by mouth every 6 (six) hours. (Patient not taking: Reported on 04/17/2016) 12 tablet 0  . pantoprazole (PROTONIX) 40 MG tablet Take 1 tablet (40 mg total) by mouth daily. (Patient not taking: Reported on 02/22/2016) 30 tablet 2  . doxycycline (VIBRAMYCIN) 100 MG capsule Take 1 capsule (100 mg total) by mouth 2 (two) times daily. (Patient not taking: Reported on 04/17/2016) 20 capsule 0  . predniSONE (STERAPRED UNI-PAK 21 TAB) 10 MG (21) TBPK  tablet Take 1 tablet (10 mg total) by mouth daily. Take 6 tabs by mouth daily  for 6 days, then 5 tabs for 5 days, then 4 tabs for 4 days, then 3 tabs for 3 days, 2 tabs for 2 days, then 1 tab by mouth daily for 1 days 91 tablet 0   No facility-administered medications prior to visit.     ROS Review of Systems  Constitutional: Positive for appetite change. Negative for activity change and fatigue.  HENT: Negative for congestion, sinus pressure and sore throat.   Eyes: Negative for visual disturbance.  Respiratory: Negative for cough, chest tightness,  shortness of breath and wheezing.   Cardiovascular: Negative for chest pain and palpitations.  Gastrointestinal:       See hpi  Endocrine: Negative for polydipsia.  Genitourinary: Negative for dysuria and frequency.  Musculoskeletal: Negative for arthralgias and back pain.  Skin: Positive for rash.  Neurological: Negative for tremors, light-headedness and numbness.  Hematological: Does not bruise/bleed easily.  Psychiatric/Behavioral: Negative for agitation and behavioral problems.    Objective:  BP (!) 139/93 (BP Location: Right Arm, Patient Position: Sitting, Cuff Size: Small)   Pulse 100   Temp 98.7 F (37.1 C) (Oral)   Ht 5\' 4"  (1.626 m)   Wt 100 lb 12.8 oz (45.7 kg)   SpO2 98%   BMI 17.30 kg/m   BP/Weight 09/04/2016 04/17/2016 99991111  Systolic BP [id number] 123456 [id number]  Diastolic BP 93 81 82  Wt. (Lbs) 100.8 115 112  BMI 17.3 19.74 18.93  Some encounter information is confidential and restricted. Go to Review Flowsheets activity to see all data.      Physical Exam  Constitutional: She is oriented to person, place, and time. She appears well-developed and well-nourished.  Thin looking  Cardiovascular: Normal rate, normal heart sounds and intact distal pulses.   No murmur heard. Pulmonary/Chest: Effort normal and breath sounds normal. She has no wheezes. She has no rales. She exhibits no tenderness.  Abdominal: Soft. Bowel sounds are normal. She exhibits no distension and no mass. There is tenderness ( mild epigastric tenderness).  Musculoskeletal: Normal range of motion.  Neurological: She is alert and oriented to person, place, and time.  Skin:  Hypopigmented patches diffusely distributed in total body surfaces sparing the face. Pinpoint erythematous lesions in back in webspaces of the fingers as well as the lower extremities with scratch marks.     Assessment & Plan:   1. Alcohol abuse Counseled on cessation as this could explain anorexia - COMPLETE METABOLIC PANEL WITH  GFR  2. Gastritis and gastroduodenitis Has been out of her omeprazole which could explain abdominal symptoms and nausea - omeprazole (PRILOSEC) 20 MG capsule; Take 1 capsule (20 mg total) by mouth daily.  Dispense: 30 capsule; Refill: 3  3. Tobacco abuse Spent 3 minutes counseling on cessation and she is not willing to quit at this time  4. Other emphysema ([place]) Uncontrolled Adding ICS/LABA to regimen - albuterol (PROVENTIL HFA;VENTOLIN HFA) 108 (90 Base) MCG/ACT inhaler; Inhale 1-2 puffs into the lungs every 6 (six) hours as needed for wheezing or shortness of breath.  Dispense: 1 Inhaler; Refill: 3 - budesonide-formoterol (SYMBICORT) 160-4.5 MCG/ACT inhaler; Inhale 2 puffs into the lungs 2 (two) times daily.  Dispense: 1 Inhaler; Refill: 3  5. Loss of weight Could be due to excessive alcohol consumption with poor appetite We'll screen for thyroid disorder - TSH  6. Scabies Discussed care of bedding - permethrin (ELIMITE) 5 % cream; Apply  1 application topically once.  Dispense: 60 g; Refill: 0  7. Tinea corporis Due to the diffuse nature of rash she will need an oral antifungal. I will hold off on toes her comprehensive metabolic panel is obtained as well as LFTs unlikely to be elevated due to alcohol consumption  8. Melena Previous history of gastritis and she has been off medications which could explain her symptoms I have also explained to the patient that she could have varices from excessive alcohol consumption. - CBC with Differential/Platelet   Meds ordered this encounter  Medications  . omeprazole (PRILOSEC) 20 MG capsule    Sig: Take 1 capsule (20 mg total) by mouth daily.    Dispense:  30 capsule    Refill:  3  . albuterol (PROVENTIL HFA;VENTOLIN HFA) 108 (90 Base) MCG/ACT inhaler    Sig: Inhale 1-2 puffs into the lungs every 6 (six) hours as needed for wheezing or shortness of breath.    Dispense:  1 Inhaler    Refill:  3  . budesonide-formoterol (SYMBICORT)  160-4.5 MCG/ACT inhaler    Sig: Inhale 2 puffs into the lungs 2 (two) times daily.    Dispense:  1 Inhaler    Refill:  3  . permethrin (ELIMITE) 5 % cream    Sig: Apply 1 application topically once.    Dispense:  60 g    Refill:  0    Follow-up: Return in about 3 weeks (around 09/25/2016) for Follow-up on gastritis and melena.   [name] MD

## 2016-09-06 ENCOUNTER — Telehealth: Payer: Self-pay

## 2016-09-06 ENCOUNTER — Other Ambulatory Visit: Payer: Self-pay | Admitting: Family Medicine

## 2016-09-06 DIAGNOSIS — R748 Abnormal levels of other serum enzymes: Secondary | ICD-10-CM

## 2016-09-06 LAB — HEPATITIS PANEL, ACUTE
HCV Ab: NEGATIVE
Hep A IgM: NONREACTIVE
Hep B C IgM: NONREACTIVE
Hepatitis B Surface Ag: NEGATIVE

## 2016-09-06 LAB — GAMMA GT: GGT: 737 U/L — AB (ref 7–51)

## 2016-09-06 NOTE — Telephone Encounter (Signed)
-----   Message from [name], MD sent at 09/06/2016  8:36 AM EST ----- Liver enzymes are elevated, likely secondary to alcohol consumption. Advised to work on quitting.

## 2016-09-06 NOTE — Progress Notes (Unsigned)
Could you please add on a GGT and hepatitis panel? Thanks.

## 2016-09-06 NOTE — Telephone Encounter (Signed)
Writer called patient with lab results.  Patient stated understanding and has started going to "her meetings" for the alcohol issue.

## 2016-09-12 ENCOUNTER — Telehealth: Payer: Self-pay | Admitting: Family Medicine

## 2016-09-12 NOTE — Telephone Encounter (Signed)
Pt called requesting refill on permethrin 5% cream myl .Pt states she is out of medication and is still in need of it.

## 2016-09-13 MED ORDER — PERMETHRIN 5 % EX CREA: 1.0000 "application " | TOPICAL_CREAM | Freq: Once | CUTANEOUS | 1 refills | Status: AC

## 2016-09-13 NOTE — Telephone Encounter (Signed)
Done

## 2016-09-13 NOTE — Telephone Encounter (Signed)
Writer called patient to inform her that MD refilled her requested cream.  Patient stated understanding.

## 2016-09-14 ENCOUNTER — Encounter (HOSPITAL_COMMUNITY): Payer: Self-pay | Admitting: Family Medicine

## 2016-09-14 ENCOUNTER — Ambulatory Visit (HOSPITAL_COMMUNITY)
Admission: EM | Admit: 2016-09-14 | Discharge: 2016-09-14 | Disposition: A | Discharge status: Home / Self Care | Payer: Medicaid Other | Attending: Family Medicine | Admitting: Family Medicine

## 2016-09-14 ENCOUNTER — Ambulatory Visit (INDEPENDENT_AMBULATORY_CARE_PROVIDER_SITE_OTHER): Payer: Medicaid Other

## 2016-09-14 DIAGNOSIS — S39012A Strain of muscle, fascia and tendon of lower back, initial encounter: Secondary | ICD-10-CM

## 2016-09-14 DIAGNOSIS — R143 Flatulence: Secondary | ICD-10-CM | POA: Diagnosis not present

## 2016-09-14 DIAGNOSIS — R109 Unspecified abdominal pain: Secondary | ICD-10-CM

## 2016-09-14 DIAGNOSIS — B373 Candidiasis of vulva and vagina: Secondary | ICD-10-CM

## 2016-09-14 DIAGNOSIS — N390 Urinary tract infection, site not specified: Secondary | ICD-10-CM

## 2016-09-14 DIAGNOSIS — B3731 Acute candidiasis of vulva and vagina: Secondary | ICD-10-CM

## 2016-09-14 DIAGNOSIS — R141 Gas pain: Secondary | ICD-10-CM

## 2016-09-14 LAB — POCT URINALYSIS DIP (DEVICE)
BILIRUBIN URINE: NEGATIVE
Glucose, UA: NEGATIVE mg/dL
HGB URINE DIPSTICK: NEGATIVE
KETONES UR: NEGATIVE mg/dL
Nitrite: NEGATIVE
PH: 8.5 — AB (ref 5.0–8.0)
Protein, ur: NEGATIVE mg/dL
SPECIFIC GRAVITY, URINE: 1.015 (ref 1.005–1.030)
Urobilinogen, UA: 1 mg/dL (ref 0.0–1.0)

## 2016-09-14 LAB — POCT PREGNANCY, URINE: PREG TEST UR: NEGATIVE

## 2016-09-14 MED ORDER — CEPHALEXIN 500 MG PO CAPS: 500.0000 mg | ORAL_CAPSULE | Freq: Four times a day (QID) | ORAL | 0 refills | Status: DC

## 2016-09-14 MED ORDER — NAPROXEN 375 MG PO TABS: 375.0000 mg | ORAL_TABLET | Freq: Two times a day (BID) | ORAL | 0 refills | Status: DC

## 2016-09-14 MED ORDER — FLUCONAZOLE 150 MG PO TABS: ORAL_TABLET | ORAL | 0 refills | Status: DC

## 2016-09-14 NOTE — ED Provider Notes (Signed)
CSN: [id number]     Arrival date & time 09/14/16  1512 History   First MD Initiated Contact with Patient 09/14/16 1546     Chief Complaint  Patient presents with  . Flank Pain  . Back Pain   (Consider location/radiation/quality/duration/timing/severity/associated sxs/prior Treatment) 41 year old female complaining of pain in the bilateral lower abdomen and the bilateral low back. She states the pain in her back has been there for a couple days but got worse yesterday. It is worse with bending, pulling and lifting and other movements. She states the pain in the left and right abdomen is constant. She has normal bowel movements. Denies nausea or vomiting or diarrhea or constipation. She denies dysuria but complains of urinary frequency and some urgency. She is also concerned about a rash in the groin.      Past Medical History:  Diagnosis Date  . Alcohol abuse   . Anxiety   . Bipolar 1 disorder ([place])   . PTSD (post-traumatic stress disorder)    Past Surgical History:  Procedure Laterality Date  . ABDOMINAL HYSTERECTOMY    . FOOT SURGERY    . OTHER SURGICAL HISTORY     ovarian surgery  . TUBAL LIGATION     Family History  Problem Relation Age of Onset  . Hypertension Mother   . Cancer Mother     breast cancer  . Hypertension Father   . Heart attack Cousin 30    died in 28s of MI   Social History  Substance Use Topics  . Smoking status: Heavy Tobacco Smoker    Packs/day: 0.50    Types: Cigarettes  . Smokeless tobacco: Never Used     Comment: 6-7 cigs daily  . Alcohol use 1.8 oz/week    3 Cans of beer per week     Comment: every other weekend   OB History    No data available     Review of Systems  Constitutional: Negative for fever.       Appears thin and chronically ill.  HENT: Negative.   Respiratory: Negative.   Cardiovascular: Negative for chest pain.  Gastrointestinal: Positive for abdominal pain. Negative for constipation, diarrhea, nausea and vomiting.   Genitourinary: Positive for frequency. Negative for dysuria, pelvic pain, vaginal bleeding and vaginal discharge.       Patient states she had a recent vaginal discharge, now no longer.  Skin: Negative.   Neurological: Negative.   All other systems reviewed and are negative.   Allergies  Patient has no known allergies.  Home Medications   Prior to Admission medications   Medication Sig Start Date End Date Taking? Authorizing Provider  albuterol (PROVENTIL HFA;VENTOLIN HFA) 108 (90 Base) MCG/ACT inhaler Inhale 1-2 puffs into the lungs every 6 (six) hours as needed for wheezing or shortness of breath. 09/04/16   [name], MD  budesonide-formoterol (SYMBICORT) 160-4.5 MCG/ACT inhaler Inhale 2 puffs into the lungs 2 (two) times daily. 09/04/16   [name], MD  cephALEXin (KEFLEX) 500 MG capsule Take 1 capsule (500 mg total) by mouth 4 (four) times daily. 09/14/16   Janne Napoleon, NP  fluconazole (DIFLUCAN) 150 MG tablet 1 tab po x 1. May repeat in 72 hours if no improvement 09/14/16   Janne Napoleon, NP  Mirtazapine (REMERON PO) Take 1 tablet by mouth at bedtime.    Historical Provider, MD  naproxen (NAPROSYN) 375 MG tablet Take 1 tablet (375 mg total) by mouth 2 (two) times daily. 09/14/16   Janne Napoleon, NP  omeprazole (PRILOSEC) 20 MG capsule Take 1 capsule (20 mg total) by mouth daily. 09/04/16   [name], MD  phenylephrine-shark liver oil-mineral oil-petrolatum (PREPARATION H) 0.25-3-14-71.9 % rectal ointment Place 1 application rectally 2 (two) times daily as needed for hemorrhoids.    Historical Provider, MD   Meds Ordered and Administered this Visit  Medications - No data to display  BP 104/75   Pulse 94   Temp 98.5 F (36.9 C)   SpO2 100%  No data found.   Physical Exam  Constitutional: She is oriented to person, place, and time. No distress.  Than possibly under nourished. No acute distress. Warm and dry.  HENT:  Mouth/Throat: Oropharynx is clear and moist.  Eyes: EOM are  normal.  Some dark circles under the eyes.  Neck: Normal range of motion. Neck supple.  Cardiovascular: Normal rate and regular rhythm.   Pulmonary/Chest: Effort normal and breath sounds normal. No respiratory distress. She has no wheezes.  Abdominal: Soft. Bowel sounds are normal. She exhibits no mass. There is tenderness.  Tenderness to the left and right mid abdomen. No tenderness to the lower most quadrants, across the anterior pelvis or upper abdomen. Positive for guarding.  Genitourinary:  Genitourinary Comments: Normal external female genitalia anatomy. There is deep erythema to the introitus, labial mucosa and meatus.  Musculoskeletal: Normal range of motion. She exhibits no edema or deformity.  Reproducible tenderness to the right lower back, para lumbar musculature's. Patient is able to flex forward 90. Palpation to the para lumbar musculature while flex forward produces greater pain.  Neurological: She is alert and oriented to person, place, and time. No cranial nerve deficit.  Skin: Skin is warm and dry. She is not diaphoretic.  Psychiatric: She has a normal mood and affect.    Urgent Care Course     Procedures (including critical care time)  Labs Review Labs Reviewed  POCT URINALYSIS DIP (DEVICE) - Abnormal; Notable for the following:       Result Value   pH 8.5 (*)    Leukocytes, UA LARGE (*)    All other components within normal limits  URINE CULTURE  POCT PREGNANCY, URINE    Imaging Review Dg Abd 2 Views  Result Date: 09/14/2016 CLINICAL DATA:  Acute onset of lower abdominal pain, radiating to the lower back. Initial encounter. EXAM: ABDOMEN - 2 VIEW COMPARISON:  None. FINDINGS: The visualized bowel gas pattern is unremarkable. Scattered air and stool filled loops of colon are seen; no abnormal dilatation of small bowel loops is seen to suggest small bowel obstruction. No free intra-abdominal air is identified, though evaluation for free air is limited on a single  supine view. The visualized osseous structures are within normal limits; the sacroiliac joints are unremarkable in appearance. The visualized lung bases are essentially clear. IMPRESSION: Unremarkable bowel gas pattern; no free intra-abdominal air seen. Electronically Signed   By: Garald Balding M.D.   On: 09/14/2016 17:06     Visual Acuity Review  Right Eye Distance:   Left Eye Distance:   Bilateral Distance:    Right Eye Near:   Left Eye Near:    Bilateral Near:         MDM   1. Lower urinary tract infectious disease   2. Candida vaginitis   3. Strain of lumbar region, initial encounter   4. Abdominal pain, unspecified abdominal location   5. Abdominal gas pain    The pain in her back is due to muscle pain  or strain. Limit any heavy lifting or pulling. Apply heat to the back. Perform stretches by bending over and stretching as you did in the office. He will also have a yeast infection. Take the Diflucan for that. It also appears you have a urinary tract infection. He will be given antibiotic for that. The most likely explanation for pain in the abdomen is gas pain which shows up on x-ray. Recommend you take some MiraLAX to clean the bowel out. If the pain does not go away after having good bowel movements usually need to follow-up with your primary care doctor. Meds ordered this encounter  Medications  . cephALEXin (KEFLEX) 500 MG capsule    Sig: Take 1 capsule (500 mg total) by mouth 4 (four) times daily.    Dispense:  28 capsule    Refill:  0    Order Specific Question:   Supervising Provider    Answer:   Billy Fischer [phone]  . fluconazole (DIFLUCAN) 150 MG tablet    Sig: 1 tab po x 1. May repeat in 72 hours if no improvement    Dispense:  2 tablet    Refill:  0    Order Specific Question:   Supervising Provider    Answer:   Billy Fischer [phone]  . naproxen (NAPROSYN) 375 MG tablet    Sig: Take 1 tablet (375 mg total) by mouth 2 (two) times daily.    Dispense:  20  tablet    Refill:  0    Order Specific Question:   Supervising Provider    Answer:   Billy Fischer [[id number]]       Janne Napoleon, NP 09/14/16 [phone]

## 2016-09-14 NOTE — Discharge Instructions (Signed)
The pain in her back is due to muscle pain or strain. Limit any heavy lifting or pulling. Apply heat to the back. Perform stretches by bending over and stretching as you did in the office. He will also have a yeast infection. Take the Diflucan for that. It also appears you have a urinary tract infection. He will be given antibiotic for that. The most likely explanation for pain in the abdomen is gas pain which shows up on x-ray. Recommend you take some MiraLAX to clean the bowel out. If the pain does not go away after having good bowel movements usually need to follow-up with your primary care doctor.

## 2016-09-14 NOTE — ED Triage Notes (Signed)
Pt here for bilateral flank pain and lower back pain. sts also rash in vaginal area. sts some discharge and incontinence.

## 2016-09-16 LAB — URINE CULTURE: Culture: NO GROWTH

## 2016-09-21 ENCOUNTER — Ambulatory Visit: Payer: Medicaid Other | Attending: Family Medicine | Admitting: Family Medicine

## 2016-09-21 ENCOUNTER — Telehealth: Payer: Self-pay | Admitting: Family Medicine

## 2016-09-21 ENCOUNTER — Encounter: Payer: Self-pay | Admitting: Family Medicine

## 2016-09-21 VITALS — BP 105/65 | HR 80 | Temp 98.4°F | Resp 18 | Ht 64.0 in | Wt 111.6 lb

## 2016-09-21 DIAGNOSIS — J449 Chronic obstructive pulmonary disease, unspecified: Secondary | ICD-10-CM | POA: Insufficient documentation

## 2016-09-21 DIAGNOSIS — Z79899 Other long term (current) drug therapy: Secondary | ICD-10-CM | POA: Insufficient documentation

## 2016-09-21 DIAGNOSIS — F319 Bipolar disorder, unspecified: Secondary | ICD-10-CM | POA: Insufficient documentation

## 2016-09-21 DIAGNOSIS — Z90710 Acquired absence of both cervix and uterus: Secondary | ICD-10-CM | POA: Insufficient documentation

## 2016-09-21 DIAGNOSIS — R748 Abnormal levels of other serum enzymes: Secondary | ICD-10-CM | POA: Diagnosis not present

## 2016-09-21 DIAGNOSIS — Z9889 Other specified postprocedural states: Secondary | ICD-10-CM | POA: Insufficient documentation

## 2016-09-21 DIAGNOSIS — R600 Localized edema: Secondary | ICD-10-CM | POA: Insufficient documentation

## 2016-09-21 DIAGNOSIS — F1021 Alcohol dependence, in remission: Secondary | ICD-10-CM | POA: Diagnosis present

## 2016-09-21 DIAGNOSIS — F4310 Post-traumatic stress disorder, unspecified: Secondary | ICD-10-CM | POA: Diagnosis not present

## 2016-09-21 DIAGNOSIS — N390 Urinary tract infection, site not specified: Secondary | ICD-10-CM | POA: Diagnosis not present

## 2016-09-21 LAB — COMPLETE METABOLIC PANEL WITH GFR
ALT: 34 U/L — AB (ref 6–29)
AST: 80 U/L — AB (ref 10–30)
Albumin: 3.1 g/dL — ABNORMAL LOW (ref 3.6–5.1)
Alkaline Phosphatase: 155 U/L — ABNORMAL HIGH (ref 33–115)
BUN: 3 mg/dL — AB (ref 7–25)
CALCIUM: 9.2 mg/dL (ref 8.6–10.2)
CHLORIDE: 109 mmol/L (ref 98–110)
CO2: 24 mmol/L (ref 20–31)
CREATININE: 0.5 mg/dL (ref 0.50–1.10)
GFR, Est African American: >89 mL/min (ref >=60)
GFR, Est Non African American: >89 mL/min (ref >=60)
GLUCOSE: 84 mg/dL (ref 65–99)
POTASSIUM: 4.2 mmol/L (ref 3.5–5.3)
SODIUM: 141 mmol/L (ref 135–146)
Total Bilirubin: 0.4 mg/dL (ref 0.2–1.2)
Total Protein: 6.5 g/dL (ref 6.1–8.1)

## 2016-09-21 MED ORDER — FUROSEMIDE 20 MG PO TABS: 20.0000 mg | ORAL_TABLET | Freq: Every day | ORAL | 1 refills | Status: DC

## 2016-09-21 NOTE — Telephone Encounter (Signed)
She needs an office visit. Please encourage her to quit alcohol consumption as that could be contributory

## 2016-09-21 NOTE — Progress Notes (Signed)
Subjective:  Patient ID: Felicia Perkins, female    DOB: 01-30-1976  Age: 41 y.o. MRN: [id number]  CC: Foot Swelling (bilateral)   HPI Felicia Perkins presentsComplaining of a 3 day history of bilateral pedal edema and associated pain in her feet. Medical history is significant for COPD, bipolar, gastritis alcohol abuse and she informs me she quit 2 weeks ago. Most recent labs revealed elevated LFTs and GGT but a normal albumin, negative hepatitis panel. She denies shortness of breath, chest pains, orthopnea.  Of note she was placed on Keflex last week for UTI at her visit to the ED.  Past Medical History:  Diagnosis Date  . Alcohol abuse   . Anxiety   . Bipolar 1 disorder ([place])   . PTSD (post-traumatic stress disorder)     Past Surgical History:  Procedure Laterality Date  . ABDOMINAL HYSTERECTOMY    . FOOT SURGERY    . OTHER SURGICAL HISTORY     ovarian surgery  . TUBAL LIGATION      No Known Allergies   Outpatient Medications Prior to Visit  Medication Sig Dispense Refill  . albuterol (PROVENTIL HFA;VENTOLIN HFA) 108 (90 Base) MCG/ACT inhaler Inhale 1-2 puffs into the lungs every 6 (six) hours as needed for wheezing or shortness of breath. 1 Inhaler 3  . budesonide-formoterol (SYMBICORT) 160-4.5 MCG/ACT inhaler Inhale 2 puffs into the lungs 2 (two) times daily. 1 Inhaler 3  . cephALEXin (KEFLEX) 500 MG capsule Take 1 capsule (500 mg total) by mouth 4 (four) times daily. 28 capsule 0  . Mirtazapine (REMERON PO) Take 1 tablet by mouth at bedtime.    . naproxen (NAPROSYN) 375 MG tablet Take 1 tablet (375 mg total) by mouth 2 (two) times daily. 20 tablet 0  . omeprazole (PRILOSEC) 20 MG capsule Take 1 capsule (20 mg total) by mouth daily. 30 capsule 3  . phenylephrine-shark liver oil-mineral oil-petrolatum (PREPARATION H) 0.25-3-14-71.9 % rectal ointment Place 1 application rectally 2 (two) times daily as needed for hemorrhoids.    . fluconazole (DIFLUCAN) 150 MG tablet  1 tab po x 1. May repeat in 72 hours if no improvement 2 tablet 0   No facility-administered medications prior to visit.     ROS Review of Systems  Constitutional: Negative for activity change and appetite change.  HENT: Negative for sinus pressure and sore throat.   Respiratory: Negative for chest tightness, shortness of breath and wheezing.   Cardiovascular: Positive for leg swelling. Negative for chest pain and palpitations.  Gastrointestinal: Negative for abdominal distention, abdominal pain and constipation.  Genitourinary: Negative.   Musculoskeletal: Negative.   Psychiatric/Behavioral: Negative for behavioral problems and dysphoric mood.    Objective:  BP 105/65 (BP Location: Right Arm, Patient Position: Sitting, Cuff Size: Small)   Pulse 80   Temp 98.4 F (36.9 C) (Oral)   Resp 18   Ht 5\' 4"  (1.626 m)   Wt 111 lb 9.6 oz (50.6 kg)   SpO2 99%   BMI 19.16 kg/m   BP/Weight 09/21/2016 09/14/2016 0000000  Systolic BP 123456 123456 [id number]  Diastolic BP 65 75 93  Wt. (Lbs) 111.6 - 100.8  BMI 19.16 - 17.3  Some encounter information is confidential and restricted. Go to Review Flowsheets activity to see all data.      Physical Exam  Constitutional: She is oriented to person, place, and time. She appears well-developed and well-nourished.  Cardiovascular: Normal rate, normal heart sounds and intact distal pulses.   No murmur  heard. Pulmonary/Chest: Effort normal and breath sounds normal. She has no wheezes. She has no rales. She exhibits no tenderness.  Abdominal: Soft. Bowel sounds are normal. She exhibits no distension and no mass. There is no tenderness.  Musculoskeletal: Normal range of motion. She exhibits edema (2+ plus bilateral nonpitting pedal edema).  Neurological: She is alert and oriented to person, place, and time.    CMP Latest Ref Rng & Units 09/04/2016 02/22/2016 12/15/2014  Glucose 65 - 99 mg/dL 103(H) 102(H) 113(H)  BUN 7 - 25 mg/dL 4(L) <5(L) <5(L)  Creatinine  0.50 - 1.10 mg/dL 0.43(L) 0.41(L) 0.51  Sodium 135 - 146 mmol/L 136 136 134(L)  Potassium 3.5 - 5.3 mmol/L 3.5 3.8 3.6  Chloride 98 - 110 mmol/L 100 102 96(L)  CO2 20 - 31 mmol/L 21 24 22   Calcium 8.6 - 10.2 mg/dL 9.9 9.0 10.0  Total Protein 6.1 - 8.1 g/dL 7.5 8.0 7.7  Total Bilirubin 0.2 - 1.2 mg/dL 2.3(H) 0.3 1.4(H)  Alkaline Phos 33 - 115 U/L 125(H) 119 125  AST 10 - 30 U/L 144(H) 122(H) 225(H)  ALT 6 - 29 U/L 43(H) 45 106(H)      Assessment & Plan:   1. Alcohol dependence in remission ([place]) Commended on quitting alcohol and advised to abstain - COMPLETE METABOLIC PANEL WITH GFR - US Abdomen Limited RUQ; Future  2. Elevated liver enzymes Will need to evaluate for liver cirrhosis - US Abdomen Limited RUQ; Future  3. Pedal edema Cardiac etiology less likely Cannot exclude underlying liver disease as etiology - COMPLETE METABOLIC PANEL WITH GFR - furosemide (LASIX) 20 MG tablet; Take 1 tablet (20 mg total) by mouth daily. For pedal edema  Dispense: 30 tablet; Refill: 1   Meds ordered this encounter  Medications  . furosemide (LASIX) 20 MG tablet    Sig: Take 1 tablet (20 mg total) by mouth daily. For pedal edema    Dispense:  30 tablet    Refill:  1    Follow-up: Return in about 1 month (around 10/19/2016) for Follow-up of pedal edema.   [name] MD

## 2016-09-21 NOTE — Progress Notes (Signed)
Patient is here for bilateral foot swelling.  Patient complains of bilateral foot swelling beginning 3 days ago. Patient states elevation provides minimal relief.   Patient has taken medication today. Patient has eaten today.  Patient declined the flu vaccine today.

## 2016-09-21 NOTE — Telephone Encounter (Signed)
Patient being offered an appt today.

## 2016-09-21 NOTE — Telephone Encounter (Signed)
Patient call the office concerned about the reaction that she is experiencing. Pt's legs are swollen to the point that she can't walk. Pt believes that it due to her medication. Please follow up.  Thank you,.

## 2016-09-24 ENCOUNTER — Telehealth: Payer: Self-pay

## 2016-09-24 NOTE — Telephone Encounter (Signed)
-----   Message from [name], MD sent at 09/24/2016  8:21 AM EST ----- She does have low albumin which could explain her edema and LFTs are elevated from alcoholism. Will follow up with her ather next visit to evaluate for cirrhosis.

## 2016-09-24 NOTE — Telephone Encounter (Signed)
Writer discussed results with patient and patient is aware that she will be evaluated for cirrhosis at her next visit.  Patient reports that swelling has not improved.

## 2016-09-25 ENCOUNTER — Ambulatory Visit: Payer: Self-pay | Admitting: Family Medicine

## 2016-10-01 ENCOUNTER — Ambulatory Visit (HOSPITAL_COMMUNITY)
Admission: RE | Admit: 2016-10-01 | Discharge: 2016-10-01 | Disposition: A | Discharge status: Home / Self Care | Payer: Medicaid Other | Source: Ambulatory Visit | Attending: Family Medicine | Admitting: Family Medicine

## 2016-10-01 DIAGNOSIS — F1021 Alcohol dependence, in remission: Secondary | ICD-10-CM | POA: Diagnosis present

## 2016-10-01 DIAGNOSIS — R748 Abnormal levels of other serum enzymes: Secondary | ICD-10-CM | POA: Diagnosis not present

## 2016-10-01 DIAGNOSIS — R160 Hepatomegaly, not elsewhere classified: Secondary | ICD-10-CM | POA: Insufficient documentation

## 2016-10-01 DIAGNOSIS — R932 Abnormal findings on diagnostic imaging of liver and biliary tract: Secondary | ICD-10-CM | POA: Insufficient documentation

## 2016-10-01 DIAGNOSIS — K760 Fatty (change of) liver, not elsewhere classified: Secondary | ICD-10-CM | POA: Insufficient documentation

## 2016-10-02 ENCOUNTER — Telehealth: Payer: Self-pay | Admitting: *Deleted

## 2016-10-02 NOTE — Telephone Encounter (Signed)
MA unable to leave a message due to phone ringing and disconnecting.  !!!A communication letter has been mailed out!!!

## 2016-10-02 NOTE — Telephone Encounter (Signed)
-----   Message from [name], MD sent at 10/01/2016 12:09 PM EST ----- Ultrasound reveals enlarged liver and early cirrhosis which could explain her leg swelling; advised to quit alcohol

## 2016-10-05 ENCOUNTER — Telehealth: Payer: Self-pay | Admitting: Family Medicine

## 2016-10-05 NOTE — Telephone Encounter (Signed)
Patient is calling regarding her results. Please follow up

## 2016-10-05 NOTE — Telephone Encounter (Signed)
Patient verified DOB Patient is aware of US showing enlarged liver and early cirrhosis. Patient is aware of this possibly being the reason on her leg swelling. Patient states she has not drank since 09/08/16. Patient encouraged to continue taking each day at a time. Patient expressed her understanding and had no further questions at this time.

## 2016-10-15 ENCOUNTER — Encounter (INDEPENDENT_AMBULATORY_CARE_PROVIDER_SITE_OTHER): Payer: Self-pay

## 2016-10-15 ENCOUNTER — Encounter: Payer: Self-pay | Admitting: Family Medicine

## 2016-10-15 ENCOUNTER — Ambulatory Visit: Payer: Medicaid Other | Attending: Family Medicine | Admitting: Family Medicine

## 2016-10-15 VITALS — BP 135/100 | HR 71 | Temp 98.2°F | Ht 64.0 in | Wt 106.4 lb

## 2016-10-15 DIAGNOSIS — F319 Bipolar disorder, unspecified: Secondary | ICD-10-CM | POA: Insufficient documentation

## 2016-10-15 DIAGNOSIS — F4310 Post-traumatic stress disorder, unspecified: Secondary | ICD-10-CM | POA: Diagnosis not present

## 2016-10-15 DIAGNOSIS — Z90710 Acquired absence of both cervix and uterus: Secondary | ICD-10-CM | POA: Insufficient documentation

## 2016-10-15 DIAGNOSIS — R748 Abnormal levels of other serum enzymes: Secondary | ICD-10-CM | POA: Diagnosis not present

## 2016-10-15 DIAGNOSIS — K760 Fatty (change of) liver, not elsewhere classified: Secondary | ICD-10-CM | POA: Insufficient documentation

## 2016-10-15 DIAGNOSIS — K7030 Alcoholic cirrhosis of liver without ascites: Secondary | ICD-10-CM | POA: Diagnosis not present

## 2016-10-15 HISTORY — DX: Alcoholic cirrhosis of liver without ascites: K70.30

## 2016-10-15 LAB — CBC WITH DIFFERENTIAL/PLATELET
BASOS PCT: 1 %
Basophils Absolute: 58 cells/uL (ref 0–200)
EOS ABS: 232 {cells}/uL (ref 15–500)
EOS PCT: 4 %
HCT: 48.4 % — ABNORMAL HIGH (ref 35.0–45.0)
Hemoglobin: 16 g/dL — ABNORMAL HIGH (ref 11.7–15.5)
Lymphocytes Relative: 49 %
Lymphs Abs: 2842 cells/uL (ref 850–3900)
MCH: 33.1 pg — ABNORMAL HIGH (ref 27.0–33.0)
MCHC: 33.1 g/dL (ref 32.0–36.0)
MCV: 100.2 fL — AB (ref 80.0–100.0)
MONOS PCT: 12 %
MPV: 10.2 fL (ref 7.5–12.5)
Monocytes Absolute: 696 cells/uL (ref 200–950)
NEUTROS ABS: 1972 {cells}/uL (ref 1500–7800)
Neutrophils Relative %: 34 %
PLATELETS: 288 10*3/uL (ref 140–400)
RBC: 4.83 MIL/uL (ref 3.80–5.10)
RDW: 13.8 % (ref 11.0–15.0)
WBC: 5.8 10*3/uL (ref 3.8–10.8)

## 2016-10-15 LAB — COMPLETE METABOLIC PANEL WITH GFR
ALBUMIN: 4.2 g/dL (ref 3.6–5.1)
ALK PHOS: 159 U/L — AB (ref 33–115)
ALT: 21 U/L (ref 6–29)
AST: 48 U/L — AB (ref 10–30)
BUN: 3 mg/dL — AB (ref 7–25)
CALCIUM: 10.5 mg/dL — AB (ref 8.6–10.2)
CHLORIDE: 102 mmol/L (ref 98–110)
CO2: 27 mmol/L (ref 20–31)
Creat: 0.5 mg/dL (ref 0.50–1.10)
Glucose, Bld: 95 mg/dL (ref 65–99)
POTASSIUM: 4.2 mmol/L (ref 3.5–5.3)
Sodium: 139 mmol/L (ref 135–146)
Total Bilirubin: 0.8 mg/dL (ref 0.2–1.2)
Total Protein: 8.1 g/dL (ref 6.1–8.1)

## 2016-10-15 NOTE — Progress Notes (Signed)
Subjective:  Patient ID: Felicia Perkins, female    DOB: 1975-10-02  Age: 41 y.o. MRN: [id number]  CC: Follow-up (edema- bilaterally) and [place] results (would like to discuss)   HPI Felicia Perkins is a 41 year old female with a history of alcohol abuse (quit in 08/2016), bipolar disorder who presents today for follow-up visit. She had presented with pedal edema at her last office visit, labs revealed elevated liver enzymes, elevated GGT, hypoalbuminemia. Right upper quadrant ultrasound revealed early liver cirrhosis.  Today she reports that pedal edema has resolved and she had to take Lasix for only 6 days. She has abstained from alcohol consumption since 09/08/16. No acute concerns today.  Past Medical History:  Diagnosis Date  . Alcohol abuse   . Anxiety   . Bipolar 1 disorder ([place])   . PTSD (post-traumatic stress disorder)     Past Surgical History:  Procedure Laterality Date  . ABDOMINAL HYSTERECTOMY    . FOOT SURGERY    . OTHER SURGICAL HISTORY     ovarian surgery  . TUBAL LIGATION      No Known Allergies   Outpatient Medications Prior to Visit  Medication Sig Dispense Refill  . albuterol (PROVENTIL HFA;VENTOLIN HFA) 108 (90 Base) MCG/ACT inhaler Inhale 1-2 puffs into the lungs every 6 (six) hours as needed for wheezing or shortness of breath. 1 Inhaler 3  . budesonide-formoterol (SYMBICORT) 160-4.5 MCG/ACT inhaler Inhale 2 puffs into the lungs 2 (two) times daily. 1 Inhaler 3  . Mirtazapine (REMERON PO) Take 1 tablet by mouth at bedtime.    . furosemide (LASIX) 20 MG tablet Take 1 tablet (20 mg total) by mouth daily. For pedal edema (Patient not taking: Reported on 10/15/2016) 30 tablet 1  . naproxen (NAPROSYN) 375 MG tablet Take 1 tablet (375 mg total) by mouth 2 (two) times daily. (Patient not taking: Reported on 10/15/2016) 20 tablet 0  . omeprazole (PRILOSEC) 20 MG capsule Take 1 capsule (20 mg total) by mouth daily. (Patient not taking: Reported on 10/15/2016) 30  capsule 3  . phenylephrine-shark liver oil-mineral oil-petrolatum (PREPARATION H) 0.25-3-14-71.9 % rectal ointment Place 1 application rectally 2 (two) times daily as needed for hemorrhoids.    . cephALEXin (KEFLEX) 500 MG capsule Take 1 capsule (500 mg total) by mouth 4 (four) times daily. 28 capsule 0   No facility-administered medications prior to visit.     ROS Review of Systems  Constitutional: Negative for activity change, appetite change and fatigue.  HENT: Negative for congestion, sinus pressure and sore throat.   Eyes: Negative for visual disturbance.  Respiratory: Negative for cough, chest tightness, shortness of breath and wheezing.   Cardiovascular: Negative for chest pain and palpitations.  Gastrointestinal: Negative for abdominal distention, abdominal pain and constipation.  Endocrine: Negative for polydipsia.  Genitourinary: Negative for dysuria and frequency.  Musculoskeletal: Negative for arthralgias and back pain.  Skin: Negative for rash.  Neurological: Negative for tremors, light-headedness and numbness.  Hematological: Does not bruise/bleed easily.  Psychiatric/Behavioral: Negative for agitation and behavioral problems.    Objective:  BP (!) 135/100 (BP Location: Right Arm, Patient Position: Sitting, Cuff Size: Small)   Pulse 71   Temp 98.2 F (36.8 C) (Oral)   Ht 5\' 4"  (1.626 m)   Wt 106 lb 6.4 oz (48.3 kg)   SpO2 98%   BMI 18.26 kg/m   BP/Weight 10/15/2016 [id number]  Systolic BP A999333 123456 123456  Diastolic BP 123XX123 65 75  Wt. (Lbs) 106.4 111.6 -  BMI 18.26 19.16 -  Some encounter information is confidential and restricted. Go to Review Flowsheets activity to see all data.      Physical Exam  Constitutional: She is oriented to person, place, and time. She appears well-developed and well-nourished.  Cardiovascular: Normal rate, normal heart sounds and intact distal pulses.   No murmur heard. Pulmonary/Chest: Effort normal and breath sounds normal.  She has no wheezes. She has no rales. She exhibits no tenderness.  Abdominal: Soft. Bowel sounds are normal. She exhibits no distension and no mass. There is no tenderness.  Musculoskeletal: Normal range of motion.  Neurological: She is alert and oriented to person, place, and time.     CMP Latest Ref Rng & Units 09/21/2016 09/04/2016 02/22/2016  Glucose 65 - 99 mg/dL 84 103(H) 102(H)  BUN 7 - 25 mg/dL 3(L) 4(L) <5(L)  Creatinine 0.50 - 1.10 mg/dL 0.50 0.43(L) 0.41(L)  Sodium 135 - 146 mmol/L 141 136 136  Potassium 3.5 - 5.3 mmol/L 4.2 3.5 3.8  Chloride 98 - 110 mmol/L 109 100 102  CO2 20 - 31 mmol/L 24 21 24   Calcium 8.6 - 10.2 mg/dL 9.2 9.9 9.0  Total Protein 6.1 - 8.1 g/dL 6.5 7.5 8.0  Total Bilirubin 0.2 - 1.2 mg/dL 0.4 2.3(H) 0.3  Alkaline Phos 33 - 115 U/L 155(H) 125(H) 119  AST 10 - 30 U/L 80(H) 144(H) 122(H)  ALT 6 - 29 U/L 34(H) 43(H) 45    CLINICAL DATA:  41 year old female with a history of alcohol dependence currently in remission. Evaluate for hepatic cirrhosis.  EXAM: US ABDOMEN LIMITED - RIGHT UPPER QUADRANT  COMPARISON:  Prior abdominal ultrasound 12/15/2014  FINDINGS: Gallbladder:  No gallstones or wall thickening visualized. No sonographic Murphy sign noted by sonographer.  Common bile duct:  Diameter: New  Liver:  No focal lesion identified. The liver is enlarged at 19.9 cm in craniocaudal dimension. Sonographic imaging of the hepatic surface demonstrates mild nodularity. Within normal limits in parenchymal echogenicity. Significant improvement than hepatic steatosis compared to 12/15/2014. The main portal vein remains patent with normal directional flow.  IMPRESSION: 1. Enlarged liver with subtle nodularity of the hepatic surface. Sonographic findings suggest early cirrhotic change. 2. Improvement in hepatic steatosis compared to 12/15/2014. 3. No focal hepatic lesion or gallbladder abnormality.   Electronically Signed   By: Jacqulynn Cadet M.D.   On: 10/01/2016 10:48  Assessment & Plan:   1. Alcoholic cirrhosis of liver without ascites ([facility]) Quit alcohol in 08/2016 Asymptomatic Pedal edema has resolved Encouraged to continue abstaining from alcohol - Protime-INR - CBC with Differential/Platelet  2. Elevated liver enzymes - COMPLETE METABOLIC PANEL WITH GFR   No orders of the defined types were placed in this encounter.   Follow-up: Return in about 3 months (around 01/15/2017) for Follow-up on cirrhosis.   [name] MD

## 2016-10-16 ENCOUNTER — Telehealth: Payer: Self-pay

## 2016-10-16 LAB — PROTIME-INR
INR: 1.1
PROTHROMBIN TIME: 11.4 s (ref 9.0–11.5)

## 2016-10-16 NOTE — Telephone Encounter (Signed)
Writer called patient to discuss lab results.  LVM requesting patient to call back.

## 2016-10-16 NOTE — Telephone Encounter (Signed)
-----   Message from Enobong Amao, MD sent at 10/16/2016  1:47 PM EST ----- Her albumin has improved and this could be due to the fact that her appetite has improved and this can help with resolution of pedal edema. Liver enzymes are still slightly elevated which is secondary to alcoholic cirrhosis. Encouraged her to continue abstaining from alcohol. We will monitor her liver enzymes. 

## 2016-10-19 ENCOUNTER — Telehealth: Payer: Self-pay

## 2016-10-19 NOTE — Telephone Encounter (Signed)
Writer called patient with test results.  Patient stated an understanding.

## 2016-10-19 NOTE — Telephone Encounter (Signed)
-----   Message from [name], MD sent at 10/16/2016  1:47 PM EST ----- Her albumin has improved and this could be due to the fact that her appetite has improved and this can help with resolution of pedal edema. Liver enzymes are still slightly elevated which is secondary to alcoholic cirrhosis. Encouraged her to continue abstaining from alcohol. We will monitor her liver enzymes.

## 2016-11-22 ENCOUNTER — Telehealth: Payer: Self-pay | Admitting: Family Medicine

## 2016-11-22 NOTE — Telephone Encounter (Signed)
Patient called the office asking to speak with PCP in regards to the red itchy bumps that she has again. Pt explained that she was bitten by a tick and that medications were prescribed to her and it helped but now bumps have come again. Pt wants to know if the same medication can be called in or if she needs an office visit. Please follow up.  Thank you

## 2016-11-23 NOTE — Telephone Encounter (Signed)
Pt states she was prescribe prednisone and a little white pill in June for this situation from the ED.  Informed she would need apt with provider before medication can be prescribe.  Connection was lost, called patient back, left message voicemail to call office to schedule apt.

## 2016-11-26 ENCOUNTER — Ambulatory Visit: Payer: Self-pay | Admitting: Family Medicine

## 2016-11-28 ENCOUNTER — Ambulatory Visit: Payer: Medicaid Other | Attending: Family Medicine | Admitting: Family Medicine

## 2016-11-28 ENCOUNTER — Encounter: Payer: Self-pay | Admitting: Family Medicine

## 2016-11-28 VITALS — BP 108/65 | HR 78 | Temp 98.3°F | Ht 64.0 in | Wt 105.4 lb

## 2016-11-28 DIAGNOSIS — R21 Rash and other nonspecific skin eruption: Secondary | ICD-10-CM | POA: Diagnosis not present

## 2016-11-28 DIAGNOSIS — L309 Dermatitis, unspecified: Secondary | ICD-10-CM | POA: Insufficient documentation

## 2016-11-28 DIAGNOSIS — F4310 Post-traumatic stress disorder, unspecified: Secondary | ICD-10-CM | POA: Diagnosis not present

## 2016-11-28 DIAGNOSIS — F319 Bipolar disorder, unspecified: Secondary | ICD-10-CM | POA: Insufficient documentation

## 2016-11-28 DIAGNOSIS — Z90710 Acquired absence of both cervix and uterus: Secondary | ICD-10-CM | POA: Insufficient documentation

## 2016-11-28 MED ORDER — PREDNISONE 20 MG PO TABS: 20.0000 mg | ORAL_TABLET | Freq: Two times a day (BID) | ORAL | 0 refills | Status: DC

## 2016-11-28 NOTE — Progress Notes (Signed)
Subjective:  Patient ID: Felicia Perkins, female    DOB: Nov 27, 1975  Age: 41 y.o. MRN: [id number]  CC: Rash (all over body)   HPI Felicia Perkins  is a 41 year old female with a history of alcohol abuse (quit in 08/2016), bipolar disorder, Alcoholic liver cirrhosis who presents today with a four-day history of generalized pruritic rash which started in her legs as some reddish dots and would develop skills when she scratches them. Rash spares her face. Denies fever, joint aches and denies recent ingestion of new foods or introduction to Medications.  A couple of months back she was treated for scabies.  She states she has had problems with her skin all her life and has done well with eczema in the past; states she started seeing a dermatologist from the age of 78.  Past Medical History:  Diagnosis Date  . Alcohol abuse   . Anxiety   . Bipolar 1 disorder ([place])   . PTSD (post-traumatic stress disorder)     Past Surgical History:  Procedure Laterality Date  . ABDOMINAL HYSTERECTOMY    . FOOT SURGERY    . OTHER SURGICAL HISTORY     ovarian surgery  . TUBAL LIGATION      No Known Allergies    Outpatient Medications Prior to Visit  Medication Sig Dispense Refill  . albuterol (PROVENTIL HFA;VENTOLIN HFA) 108 (90 Base) MCG/ACT inhaler Inhale 1-2 puffs into the lungs every 6 (six) hours as needed for wheezing or shortness of breath. 1 Inhaler 3  . budesonide-formoterol (SYMBICORT) 160-4.5 MCG/ACT inhaler Inhale 2 puffs into the lungs 2 (two) times daily. 1 Inhaler 3  . Mirtazapine (REMERON PO) Take 1 tablet by mouth at bedtime.    . furosemide (LASIX) 20 MG tablet Take 1 tablet (20 mg total) by mouth daily. For pedal edema (Patient not taking: Reported on 10/15/2016) 30 tablet 1  . naproxen (NAPROSYN) 375 MG tablet Take 1 tablet (375 mg total) by mouth 2 (two) times daily. (Patient not taking: Reported on 10/15/2016) 20 tablet 0  . omeprazole (PRILOSEC) 20 MG capsule Take 1 capsule  (20 mg total) by mouth daily. (Patient not taking: Reported on 10/15/2016) 30 capsule 3  . phenylephrine-shark liver oil-mineral oil-petrolatum (PREPARATION H) 0.25-3-14-71.9 % rectal ointment Place 1 application rectally 2 (two) times daily as needed for hemorrhoids.     No facility-administered medications prior to visit.     ROS Review of Systems  Constitutional: Negative for activity change and appetite change.  HENT: Negative for sinus pressure and sore throat.   Respiratory: Negative for chest tightness, shortness of breath and wheezing.   Cardiovascular: Negative for chest pain and palpitations.  Gastrointestinal: Negative for abdominal distention, abdominal pain and constipation.  Genitourinary: Negative.   Musculoskeletal: Negative.   Skin: Positive for rash.  Psychiatric/Behavioral: Negative for behavioral problems and dysphoric mood.    Objective:  BP 108/65 (BP Location: Right Arm, Patient Position: Sitting, Cuff Size: Small)   Pulse 78   Temp 98.3 F (36.8 C) (Oral)   Ht _0  (1.626 m)   Wt 105 lb 6.4 oz (47.8 kg)   SpO2 100%   BMI 18.09 kg/m   BP/Weight 11/28/2016 10/11/5398 03/18/7618  Systolic BP 509 326 712  Diastolic BP 65 458 65  Wt. (Lbs) 105.4 106.4 111.6  BMI 18.09 18.26 19.16  Some encounter information is confidential and restricted. Go to Review Flowsheets activity to see all data.      Physical Exam  Constitutional:  She is oriented to person, place, and time. She appears well-developed and well-nourished.  Cardiovascular: Normal rate, normal heart sounds and intact distal pulses.   No murmur heard. Pulmonary/Chest: Effort normal and breath sounds normal. She has no wheezes. She has no rales. She exhibits no tenderness.  Abdominal: Soft. Bowel sounds are normal. She exhibits no distension and no mass. There is no tenderness.  Musculoskeletal: Normal range of motion.  Neurological: She is alert and oriented to person, place, and time.  Skin:    Erythematous papules on legs, some with superficial scales. Isolated rash on anterior and posterior trunk which is macular with superficial scaling but not erythematous. Previous scars from old rash.     Assessment & Plan:   1. Rash and nonspecific skin eruption Unknown etiology Cannot exclude vasculitis We'll treat with prednisone Patient informed the clinic or present to the ED if symptoms worsen - CBC with Differential/Platelet - CMP14+EGFR - B. [place]dorfi Antibodies   Meds ordered this encounter  Medications  . predniSONE (DELTASONE) 20 MG tablet    Sig: Take 1 tablet (20 mg total) by mouth 2 (two) times daily with a meal.    Dispense:  10 tablet    Refill:  0    Follow-up: Return if symptoms worsen or fail to improve, for Follow-up of rash please keep previously scheduled appointment.   [name] MD

## 2016-11-28 NOTE — Patient Instructions (Signed)
Rash A rash is a change in the color of the skin. A rash can also change the way your skin feels. There are many different conditions and factors that can cause a rash. Follow these instructions at home: Pay attention to any changes in your symptoms. Follow these instructions to help with your condition: Medicine Take or apply over-the-counter and prescription medicines only as told by your health care provider. These may include:  Corticosteroid cream.  Anti-itch lotions.  Oral antihistamines.  Skin Care  Apply cool compresses to the affected areas.  Try taking a bath with: ? Epsom salts. Follow the instructions on the packaging. You can get these at your local pharmacy or grocery store. ? Baking soda. Pour a small amount into the bath as told by your health care provider. ? Colloidal oatmeal. Follow the instructions on the packaging. You can get this at your local pharmacy or grocery store.  Try applying baking soda paste to your skin. Stir water into baking soda until it reaches a paste-like consistency.  Do not scratch or rub your skin.  Avoid covering the rash. Make sure the rash is exposed to air as much as possible. General instructions  Avoid hot showers or baths, which can make itching worse. A cold shower may help.  Avoid scented soaps, detergents, and perfumes. Use gentle soaps, detergents, perfumes, and other cosmetic products.  Avoid any substance that causes your rash. Keep a journal to help track what causes your rash. Write down: ? What you eat. ? What cosmetic products you use. ? What you drink. ? What you wear. This includes jewelry.  Keep all follow-up visits as told by your health care provider. This is important. Contact a health care provider if:  You sweat at night.  You lose weight.  You urinate more than normal.  You feel weak.  You vomit.  Your skin or the whites of your eyes look yellow (jaundice).  Your skin: ? Tingles. ? Is  numb.  Your rash: ? Does not go away after several days. ? Gets worse.  You are: ? Unusually thirsty. ? More tired than normal.  You have: ? New symptoms. ? Pain in your abdomen. ? A fever. ? Diarrhea. Get help right away if:  You develop a rash that covers all or most of your body. The rash may or may not be painful.  You develop blisters that: ? Are on top of the rash. ? Grow larger or grow together. ? Are painful. ? Are inside your nose or mouth.  You develop a rash that: ? Looks like purple pinprick-sized spots all over your body. ? Has a "bull's eye" or looks like a target. ? Is not related to sun exposure, is red and painful, and causes your skin to peel. This information is not intended to replace advice given to you by your health care provider. Make sure you discuss any questions you have with your health care provider. Document Released: 07/20/2002 Document Revised: 01/03/2016 Document Reviewed: 12/15/2014 Elsevier Interactive Patient Education  2017 Elsevier Inc.  

## 2016-11-29 LAB — CMP14+EGFR
A/G RATIO: 1.2 (ref 1.2–2.2)
ALBUMIN: 3.9 g/dL (ref 3.5–5.5)
ALK PHOS: 152 IU/L — AB (ref 39–117)
ALT: 49 IU/L — ABNORMAL HIGH (ref 0–32)
AST: 125 IU/L — ABNORMAL HIGH (ref 0–40)
BILIRUBIN TOTAL: 0.2 mg/dL (ref 0.0–1.2)
BUN / CREAT RATIO: 6 — AB (ref 9–23)
BUN: 3 mg/dL — ABNORMAL LOW (ref 6–24)
CHLORIDE: 96 mmol/L (ref 96–106)
CO2: 25 mmol/L (ref 18–29)
Calcium: 10.1 mg/dL (ref 8.7–10.2)
Creatinine, Ser: 0.49 mg/dL — ABNORMAL LOW (ref 0.57–1.00)
GFR calc Af Amer: 139 mL/min/{1.73_m2} (ref >59)
GFR calc non Af Amer: 121 mL/min/{1.73_m2} (ref >59)
GLOBULIN, TOTAL: 3.2 g/dL (ref 1.5–4.5)
GLUCOSE: 93 mg/dL (ref 65–99)
POTASSIUM: 4.1 mmol/L (ref 3.5–5.2)
Sodium: 138 mmol/L (ref 134–144)
Total Protein: 7.1 g/dL (ref 6.0–8.5)

## 2016-11-29 LAB — CBC WITH DIFFERENTIAL/PLATELET
BASOS ABS: 0.2 10*3/uL (ref 0.0–0.2)
BASOS: 2 %
EOS (ABSOLUTE): 0.3 10*3/uL (ref 0.0–0.4)
Eos: 4 %
HEMATOCRIT: 43.2 % (ref 34.0–46.6)
HEMOGLOBIN: 14.3 g/dL (ref 11.1–15.9)
Immature Grans (Abs): 0 10*3/uL (ref 0.0–0.1)
Immature Granulocytes: 0 %
LYMPHS ABS: 3.4 10*3/uL — AB (ref 0.7–3.1)
Lymphs: 48 %
MCH: 32.5 pg (ref 26.6–33.0)
MCHC: 33.1 g/dL (ref 31.5–35.7)
MCV: 98 fL — AB (ref 79–97)
MONOCYTES: 12 %
Monocytes Absolute: 0.9 10*3/uL (ref 0.1–0.9)
NEUTROS ABS: 2.4 10*3/uL (ref 1.4–7.0)
Neutrophils: 34 %
Platelets: 249 10*3/uL (ref 150–379)
RBC: 4.4 x10E6/uL (ref 3.77–5.28)
RDW: 13.7 % (ref 12.3–15.4)
WBC: 7.1 10*3/uL (ref 3.4–10.8)

## 2016-11-29 LAB — B. BURGDORFI ANTIBODIES

## 2016-12-03 ENCOUNTER — Telehealth: Payer: Self-pay

## 2016-12-03 NOTE — Telephone Encounter (Signed)
Writer called and LVM requesting patient to call back to discuss lab results. 

## 2016-12-03 NOTE — Telephone Encounter (Signed)
Writer did speak with patient about her test results.  Patient admits to drinking for "her birthday" on 11/12/16 and ten admits to continuous drinking throughout the month.  Patient stated understanding in regards to her liver enzymes being elevated and will try to cut back or quit drinking alcohol.

## 2016-12-03 NOTE — Telephone Encounter (Signed)
-----   Message from [name], MD sent at 11/30/2016  1:27 PM EDT ----- Lyme titers came back negative however her liver enzymes are trending up again; please emphasize the need to abstain from alcohol

## 2016-12-11 ENCOUNTER — Other Ambulatory Visit: Payer: Self-pay | Admitting: Family Medicine

## 2016-12-11 NOTE — Telephone Encounter (Signed)
Pt calling to request a refill on cream to get rid of "spots". States that the cream is working but there are a few spots on her feet that are still problematic and she would like to continue to use the cream to alleviate the problem. Requests a call to advise if script will be written. Thank you.

## 2016-12-12 NOTE — Telephone Encounter (Signed)
Called number in Epic, left vm asking pt to return call to verify name of cream.

## 2016-12-12 NOTE — Telephone Encounter (Signed)
What is in the name of the cream she is requesting?

## 2016-12-13 MED ORDER — PREDNISONE 20 MG PO TABS: 20.0000 mg | ORAL_TABLET | Freq: Two times a day (BID) | ORAL | 0 refills | Status: DC

## 2016-12-13 NOTE — Telephone Encounter (Signed)
Pt aware.

## 2016-12-13 NOTE — Telephone Encounter (Signed)
Pt states that the med is prednisone. Please f/u.

## 2016-12-13 NOTE — Telephone Encounter (Signed)
Refilled

## 2017-01-16 ENCOUNTER — Ambulatory Visit: Payer: Self-pay | Admitting: Family Medicine

## 2017-03-06 ENCOUNTER — Ambulatory Visit: Payer: Medicaid Other | Admitting: Podiatry

## 2017-03-25 ENCOUNTER — Ambulatory Visit (INDEPENDENT_AMBULATORY_CARE_PROVIDER_SITE_OTHER): Payer: Medicaid Other | Admitting: Podiatry

## 2017-03-25 ENCOUNTER — Encounter: Payer: Self-pay | Admitting: Podiatry

## 2017-03-25 ENCOUNTER — Ambulatory Visit (INDEPENDENT_AMBULATORY_CARE_PROVIDER_SITE_OTHER): Payer: Medicaid Other

## 2017-03-25 DIAGNOSIS — M722 Plantar fascial fibromatosis: Secondary | ICD-10-CM | POA: Diagnosis not present

## 2017-03-25 DIAGNOSIS — M21622 Bunionette of left foot: Secondary | ICD-10-CM | POA: Diagnosis not present

## 2017-03-25 DIAGNOSIS — Q828 Other specified congenital malformations of skin: Secondary | ICD-10-CM

## 2017-03-25 DIAGNOSIS — L84 Corns and callosities: Secondary | ICD-10-CM

## 2017-03-25 MED ORDER — BETAMETHASONE SOD PHOS & ACET 6 (3-3) MG/ML IJ SUSP: 3.0000 mg | Freq: Once | INTRAMUSCULAR | Status: DC

## 2017-03-25 NOTE — Patient Instructions (Signed)
Pre-Operative Instructions  Congratulations, you have decided to take an important step towards improving your quality of life.  You can be assured that the doctors and staff at [facility] will be with you every step of the way.  Here are some important things you should know:  1. Plan to be at the surgery center/hospital at least 1 (one) hour prior to your scheduled time, unless otherwise directed by the surgical center/hospital staff.  You must have a responsible adult accompany you, remain during the surgery and drive you home.  Make sure you have directions to the surgical center/hospital to ensure you arrive on time. 2. If you are having surgery at [facility] or [facility], you will need a copy of your medical history and physical form from your family physician within one month prior to the date of surgery. We will give you a form for your primary physician to complete.  3. We make every effort to accommodate the date you request for surgery.  However, there are times where surgery dates or times have to be moved.  We will contact you as soon as possible if a change in schedule is required.   4. No aspirin/ibuprofen for one week before surgery.  If you are on aspirin, any non-steroidal anti-inflammatory medications (Mobic, Aleve, Ibuprofen) should not be taken seven (7) days prior to your surgery.  You make take Tylenol for pain prior to surgery.  5. Medications - If you are taking daily heart and blood pressure medications, seizure, reflux, allergy, asthma, anxiety, pain or diabetes medications, make sure you notify the surgery center/hospital before the day of surgery so they can tell you which medications you should take or avoid the day of surgery. 6. No food or drink after midnight the night before surgery unless directed otherwise by surgical center/hospital staff. 7. No alcoholic beverages 24-hours prior to surgery.  No smoking 24-hours prior or 24-hours after  surgery. 8. Wear loose pants or shorts. They should be loose enough to fit over bandages, boots, and casts. 9. Don't wear slip-on shoes. Sneakers are preferred. 10. Bring your boot with you to the surgery center/hospital.  Also bring crutches or a walker if your physician has prescribed it for you.  If you do not have this equipment, it will be provided for you after surgery. 11. If you have not been contacted by the surgery center/hospital by the day before your surgery, call to confirm the date and time of your surgery. 12. Leave-time from work may vary depending on the type of surgery you have.  Appropriate arrangements should be made prior to surgery with your employer. 13. Prescriptions will be provided immediately following surgery by your doctor.  Fill these as soon as possible after surgery and take the medication as directed. Pain medications will not be refilled on weekends and must be approved by the doctor. 14. Remove nail polish on the operative foot and avoid getting pedicures prior to surgery. 15. Wash the night before surgery.  The night before surgery wash the foot and leg well with water and the antibacterial soap provided. Be sure to pay special attention to beneath the toenails and in between the toes.  Wash for at least three (3) minutes. Rinse thoroughly with water and dry well with a towel.  Perform this wash unless told not to do so by your physician.  Enclosed: 1 Ice pack (please put in freezer the night before surgery)   1 Hibiclens skin cleaner     Pre-op instructions  If you have any questions regarding the instructions, please do not hesitate to call our office.  [place]: [address], [place], [place] [address] -- [phone]  [place]: [address] [place], [place] [address] -- [phone]  [place]: [address]  [place],  [address] -- [phone]  [place]: [address], [address], [place],  [address] -- [phone]  Website:  [email or web address] 

## 2017-03-25 NOTE — Progress Notes (Signed)
   Subjective:    Patient ID: Felicia Perkins, female    DOB: 14-Mar-1976, 41 y.o.   MRN: [id number]  HPI  I have horrible feet.  I have had surgery on both feet in the past by Dr Gershon Mussel.  There is a callus on my left foot by my 5th toe and Dr Gershon Mussel removed some bone to stop it but I still have it.  Then I have this huge knot in the arch on my right foot it is just getting bigger and bigger.     Review of Systems  All other systems reviewed and are negative.      Objective:   Physical Exam        Assessment & Plan:

## 2017-03-25 NOTE — Progress Notes (Signed)
Patient ID: Felicia Perkins, female   DOB: 1975-09-18, 41 y.o.   MRN: [id number]   HPI: 41 year old female presents today as a new patient for evaluation of bilateral foot issues. The patient's had surgery in the past by Dr. Gershon Mussel and another podiatrist to the bilateral feet several years ago. Patient presents today for a painful callus lesion to the sub-fifth MPJ left foot as well as a large nodule to the plantar arch of the right foot. This is been going on for several years.   Physical Exam: General: The patient is alert and oriented x3 in no acute distress.  Dermatology: Hyperkeratotic lesion noted to the sub-fifth MPJ left foot with a central core consistent with a porokeratosis. Skin is warm, dry and supple bilateral lower extremities. Negative for open lesions or macerations.  Vascular: Palpable pedal pulses bilaterally. No edema or erythema noted. Capillary refill within normal limits.  Neurological: Epicritic and protective threshold grossly intact bilaterally.   Musculoskeletal Exam: Range of motion within normal limits to all pedal and ankle joints bilateral. Muscle strength 5/5 in all groups bilateral. Non-adhered palpable nodule noted to the plantar arch of the right foot consistent with a plantar fibroma. Nodule measures approximately 3 cm in diameter.  There is also clinical evidence of a tailor's bunion deformity with a large prominent fifth metatarsal head contributory to the underlying porokeratosis sub-fifth MPJ left foot. There is some pain on palpation and range of motion also noted to the if MPJ left foot   Radiographic Exam:  Normal osseous mineralization. Joint spaces preserved. No fracture/dislocation/boney destruction.  There is a prominent metatarsal head noted to the fifth metatarsal left foot with an increased intermetatarsal angle to the fourth intermetatarsal space left foot. This is consistent with tailor's bunion deformity  Assessment: 1. Plantar fibroma right  foot 2. Tailor's bunion deformity left foot 3. Porokeratosis sub-fifth MPJ left foot   Plan of Care:  1. Patient was evaluated. X-rays reviewed today 2. Injection of 0.5 mL Celestone Soluspan injected fibroma right foot 3. Excisional debridement of the porokeratosis callus lesion was performed using a chisel blade without incident or bleeding 4. Today we discussed additional conservative versus surgical management of the plantar fibroma to the right foot as well as the tailor's bunion deformity left foot. Patient has had the symptoms for several years and all conservative modalities have failed in providing any sort of satisfactory alleviation of symptoms for the patient. Today the patient opts for surgical correction. All possible complications and details of procedure were explained. All patient questions were answered. No guarantees were expressed or implied. 5. Authorization for surgery initiated today. Surgery will consist of excision of plantar fibroma right foot. Tailor's bunionectomy with fifth metatarsal osteotomy left foot. 6. Return to clinic 1 week postop   Edrick Kins, DPM [facility]  Dr. Edrick Kins, DPM    [address],  [address]                Office [phone]  Fax [phone]

## 2017-05-09 DIAGNOSIS — D492 Neoplasm of unspecified behavior of bone, soft tissue, and skin: Secondary | ICD-10-CM

## 2017-05-09 DIAGNOSIS — M2012 Hallux valgus (acquired), left foot: Secondary | ICD-10-CM

## 2017-05-10 ENCOUNTER — Telehealth: Payer: Self-pay | Admitting: Podiatry

## 2017-05-10 NOTE — Telephone Encounter (Signed)
Dr. Amalia Hailey did foot surgery yesterday. My pharmacy is not filling the Rx that he wrote for the way it was written. They won't give me enough to last me through the weekend and I'm in a lot of pain. The best number to reach me at is [phone]. Thank you.

## 2017-05-10 NOTE — Telephone Encounter (Signed)
Spoke with patient regarding post op pain management. Advised her to supplement with ibuprofen as directed and tolerated. Advised on loosening ace bandage, ice and elavation

## 2017-05-13 NOTE — Progress Notes (Signed)
DOS- 05/09/17 - Excision of Plantar Fibroma right. Tailors bunionectomy with fifth metatarsal osteotomy left.

## 2017-05-14 ENCOUNTER — Encounter: Payer: Self-pay | Admitting: Podiatry

## 2017-05-15 ENCOUNTER — Ambulatory Visit (INDEPENDENT_AMBULATORY_CARE_PROVIDER_SITE_OTHER): Payer: Medicaid Other

## 2017-05-15 ENCOUNTER — Ambulatory Visit (INDEPENDENT_AMBULATORY_CARE_PROVIDER_SITE_OTHER): Payer: Medicaid Other | Admitting: Podiatry

## 2017-05-15 ENCOUNTER — Encounter: Payer: Self-pay | Admitting: Podiatry

## 2017-05-15 VITALS — BP 134/87 | HR 78 | Temp 99.0°F

## 2017-05-15 DIAGNOSIS — M21622 Bunionette of left foot: Secondary | ICD-10-CM

## 2017-05-15 DIAGNOSIS — M722 Plantar fascial fibromatosis: Secondary | ICD-10-CM | POA: Diagnosis not present

## 2017-05-15 MED ORDER — OXYCODONE-ACETAMINOPHEN 5-325 MG PO TABS: 1.0000 | ORAL_TABLET | Freq: Three times a day (TID) | ORAL | 0 refills | Status: DC | PRN

## 2017-05-18 NOTE — Progress Notes (Signed)
   Subjective:  Patient presents today status post bilateral foot surgery. DOS: 05/09/17. Patient states she is doing better. She reports still having difficulty walking on her feet, right greater than left. Resting and elevating the feet help alleviate the pain. She is here for further evaluation and treatment.    Past Medical History:  Diagnosis Date  . Alcohol abuse   . Anxiety   . Bipolar 1 disorder ([place])   . PTSD (post-traumatic stress disorder)       Objective/Physical Exam Skin incisions appear to be well coapted with sutures and staples intact. No sign of infectious process noted. No dehiscence. No active bleeding noted. Moderate edema noted to the surgical extremities.  Radiographic Exam:  Orthopedic hardware and osteotomies sites appear to be stable with routine healing.  Assessment: 1. s/p bilateral foot surgery. DOS: 05/09/17.   Plan of Care:  1. Patient was evaluated. X-rays reviewed 2. Dressings changed. 3. Continue weightbearing in postop shoes bilaterally 4. Refill prescription for Percocet 5/325 mg given to patient. 5. Return to clinic in 2 weeks for suture removal.   Edrick Kins, DPM [facility]  Dr. Edrick Kins, [place]                                        [place], [place] [address]                Office [phone]  Fax [phone]

## 2017-05-22 ENCOUNTER — Encounter: Payer: Self-pay | Admitting: Podiatry

## 2017-05-29 ENCOUNTER — Encounter: Payer: Self-pay | Admitting: Podiatry

## 2017-06-05 ENCOUNTER — Ambulatory Visit (INDEPENDENT_AMBULATORY_CARE_PROVIDER_SITE_OTHER): Payer: Self-pay | Admitting: Podiatry

## 2017-06-05 ENCOUNTER — Encounter: Payer: Self-pay | Admitting: Podiatry

## 2017-06-05 DIAGNOSIS — Z9889 Other specified postprocedural states: Secondary | ICD-10-CM

## 2017-06-05 MED ORDER — OXYCODONE-ACETAMINOPHEN 5-325 MG PO TABS: 1.0000 | ORAL_TABLET | Freq: Three times a day (TID) | ORAL | 0 refills | Status: DC | PRN

## 2017-06-08 NOTE — Progress Notes (Signed)
   Subjective:  Patient presents today status post bilateral foot surgery. DOS: 05/09/17. She states her left foot has improved significantly. She states her right foot is still bothering her. She is here for further evaluation and treatment.    Past Medical History:  Diagnosis Date  . Alcohol abuse   . Anxiety   . Bipolar 1 disorder ([place])   . PTSD (post-traumatic stress disorder)       Objective/Physical Exam Skin incisions appear to be well coapted with sutures and staples intact. No sign of infectious process noted. No dehiscence. No active bleeding noted. Moderate edema noted to the surgical extremities.   Assessment: 1. s/p bilateral foot surgery. DOS: 05/09/17.   Plan of Care:  1. Patient was evaluated. 2. Sutures, staples were removed. 3. Resume wearing normal sneakers on right foot. 4. Continue wearing postop shoe on left foot. 5. Refill prescription for Percocet 5/325 mg given to patient. 6. Return to clinic in 2 weeks.  Edrick Kins, DPM [facility]  Dr. Edrick Kins, [place]                                        [place], [place] [address]                Office [phone]  Fax [phone]

## 2017-06-19 ENCOUNTER — Ambulatory Visit: Payer: Medicaid Other

## 2017-06-19 ENCOUNTER — Ambulatory Visit (INDEPENDENT_AMBULATORY_CARE_PROVIDER_SITE_OTHER): Payer: Medicaid Other | Admitting: Podiatry

## 2017-06-19 DIAGNOSIS — M21622 Bunionette of left foot: Secondary | ICD-10-CM

## 2017-06-19 DIAGNOSIS — M722 Plantar fascial fibromatosis: Secondary | ICD-10-CM

## 2017-06-19 DIAGNOSIS — Z9889 Other specified postprocedural states: Secondary | ICD-10-CM

## 2017-06-23 NOTE — Progress Notes (Signed)
   Subjective:  Patient presents today status post bilateral foot surgery. DOS: 05/09/17. She states the left foot is improving but the callus she previously had is returning. She reports continued right foot pain in the forefoot and heel. She is here for further evaluation and treatment.    Past Medical History:  Diagnosis Date  . Alcohol abuse   . Anxiety   . Bipolar 1 disorder ([place])   . PTSD (post-traumatic stress disorder)       Objective/Physical Exam Skin incisions appear to be well coapted. No sign of infectious process noted. No dehiscence. No active bleeding noted. Moderate edema noted to the surgical extremities.  Radiographic Exam:  Orthopedic hardware and osteotomies sites appear to be stable with routine healing.  Assessment: 1. s/p bilateral foot surgery. DOS: 05/09/17.   Plan of Care:  1. Patient was evaluated. X-Rays reviewed.  2. Sutures removed from right foot.  3. Resume wearing good shoe gear. 4. Recommended custom molded orthotics with Rick, Pedorthist.  5. Return to clinic when necessary.  Edrick Kins, DPM [facility]  Dr. Edrick Kins, [place]                                        [place], [place] [address]                Office [phone]  Fax [phone]

## 2017-06-30 ENCOUNTER — Emergency Department (HOSPITAL_COMMUNITY): Payer: Medicaid Other

## 2017-06-30 ENCOUNTER — Other Ambulatory Visit: Payer: Self-pay

## 2017-06-30 ENCOUNTER — Encounter (HOSPITAL_COMMUNITY): Payer: Self-pay | Admitting: Emergency Medicine

## 2017-06-30 ENCOUNTER — Emergency Department (HOSPITAL_COMMUNITY)
Admission: EM | Admit: 2017-06-30 | Discharge: 2017-07-01 | Disposition: A | Discharge status: Home / Self Care | Payer: Medicaid Other | Attending: Emergency Medicine | Admitting: Emergency Medicine

## 2017-06-30 DIAGNOSIS — S6991XA Unspecified injury of right wrist, hand and finger(s), initial encounter: Secondary | ICD-10-CM | POA: Diagnosis present

## 2017-06-30 DIAGNOSIS — Y9389 Activity, other specified: Secondary | ICD-10-CM | POA: Diagnosis not present

## 2017-06-30 DIAGNOSIS — F17210 Nicotine dependence, cigarettes, uncomplicated: Secondary | ICD-10-CM | POA: Diagnosis not present

## 2017-06-30 DIAGNOSIS — Z79899 Other long term (current) drug therapy: Secondary | ICD-10-CM | POA: Diagnosis not present

## 2017-06-30 DIAGNOSIS — J449 Chronic obstructive pulmonary disease, unspecified: Secondary | ICD-10-CM | POA: Diagnosis not present

## 2017-06-30 DIAGNOSIS — Y929 Unspecified place or not applicable: Secondary | ICD-10-CM | POA: Insufficient documentation

## 2017-06-30 DIAGNOSIS — Y998 Other external cause status: Secondary | ICD-10-CM | POA: Insufficient documentation

## 2017-06-30 DIAGNOSIS — S62316A Displaced fracture of base of fifth metacarpal bone, right hand, initial encounter for closed fracture: Secondary | ICD-10-CM | POA: Insufficient documentation

## 2017-06-30 MED ORDER — OXYCODONE-ACETAMINOPHEN 5-325 MG PO TABS
1.0000 | ORAL_TABLET | Freq: Once | ORAL | Status: AC
  Administered 2017-06-30: 1 via ORAL
  Filled 2017-06-30: qty 1

## 2017-06-30 MED ORDER — HYDROCODONE-ACETAMINOPHEN 5-325 MG PO TABS: 1.0000 | ORAL_TABLET | Freq: Four times a day (QID) | ORAL | 0 refills | Status: DC | PRN

## 2017-06-30 MED ORDER — NAPROXEN 500 MG PO TABS: 500.0000 mg | ORAL_TABLET | Freq: Two times a day (BID) | ORAL | 0 refills | Status: DC
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
## 2017-07-01 NOTE — Progress Notes (Signed)
Orthopedic Tech Progress Note Patient Details:  Felicia Perkins 09-Aug-1976 [id number]  Ortho Devices Type of Ortho Device: Arm sling, Ulna gutter splint Ortho Device/Splint Location: rue Ortho Device/Splint Interventions: Ordered, Application, Adjustment   Karolee Stamps 07/01/2017, 12:11 AM

## 2017-08-15 ENCOUNTER — Encounter (HOSPITAL_COMMUNITY): Payer: Self-pay | Admitting: Family Medicine

## 2017-08-15 ENCOUNTER — Ambulatory Visit (HOSPITAL_COMMUNITY)
Admission: EM | Admit: 2017-08-15 | Discharge: 2017-08-15 | Disposition: A | Discharge status: Home / Self Care | Payer: Medicaid Other

## 2017-08-15 DIAGNOSIS — Z5321 Procedure and treatment not carried out due to patient leaving prior to being seen by health care provider: Secondary | ICD-10-CM

## 2017-08-15 NOTE — ED Provider Notes (Signed)
Patient apparently here for a cast change.  She left before I could page the orthotec to see if this could be done for her.  Nursing did provide her with an ace wrap and a number for her orthopedist. Philis Fendt, MS, PA-C 4:32 PM, 08/15/2017     Tereasa Coop, PA-C 08/15/17 [phone]

## 2017-08-15 NOTE — ED Triage Notes (Signed)
Pt here to have right hand re wrapped. sts that her orthopedic sent her here. sts seeing PCP on the 18th.

## 2017-08-16 ENCOUNTER — Telehealth: Payer: Self-pay | Admitting: Family Medicine

## 2017-08-16 NOTE — Telephone Encounter (Signed)
Patient called requesting a refill on pain medication. Please fu

## 2017-08-19 NOTE — Telephone Encounter (Signed)
Needs an office visit.

## 2017-08-23 ENCOUNTER — Ambulatory Visit: Payer: Self-pay | Admitting: Family Medicine

## 2017-08-26 ENCOUNTER — Encounter: Payer: Self-pay | Admitting: Family Medicine

## 2017-08-26 ENCOUNTER — Ambulatory Visit: Payer: Medicaid Other | Attending: Family Medicine | Admitting: Family Medicine

## 2017-08-26 DIAGNOSIS — M722 Plantar fascial fibromatosis: Secondary | ICD-10-CM

## 2017-08-26 DIAGNOSIS — S62366A Nondisplaced fracture of neck of fifth metacarpal bone, right hand, initial encounter for closed fracture: Secondary | ICD-10-CM | POA: Insufficient documentation

## 2017-08-26 DIAGNOSIS — K7030 Alcoholic cirrhosis of liver without ascites: Secondary | ICD-10-CM | POA: Insufficient documentation

## 2017-08-26 DIAGNOSIS — K2970 Gastritis, unspecified, without bleeding: Secondary | ICD-10-CM | POA: Insufficient documentation

## 2017-08-26 DIAGNOSIS — F319 Bipolar disorder, unspecified: Secondary | ICD-10-CM | POA: Diagnosis not present

## 2017-08-26 DIAGNOSIS — X58XXXA Exposure to other specified factors, initial encounter: Secondary | ICD-10-CM | POA: Insufficient documentation

## 2017-08-26 DIAGNOSIS — Z79899 Other long term (current) drug therapy: Secondary | ICD-10-CM | POA: Insufficient documentation

## 2017-08-26 DIAGNOSIS — J438 Other emphysema: Secondary | ICD-10-CM | POA: Diagnosis not present

## 2017-08-26 DIAGNOSIS — F4310 Post-traumatic stress disorder, unspecified: Secondary | ICD-10-CM | POA: Insufficient documentation

## 2017-08-26 DIAGNOSIS — K2990 Gastroduodenitis, unspecified, without bleeding: Secondary | ICD-10-CM

## 2017-08-26 DIAGNOSIS — M79641 Pain in right hand: Secondary | ICD-10-CM | POA: Diagnosis present

## 2017-08-26 HISTORY — DX: Plantar fascial fibromatosis: M72.2

## 2017-08-26 HISTORY — DX: Nondisplaced fracture of neck of fifth metacarpal bone, right hand, initial encounter for closed fracture: S62.366A

## 2017-08-26 MED ORDER — NAPROXEN 500 MG PO TABS: 500.0000 mg | ORAL_TABLET | Freq: Two times a day (BID) | ORAL | 0 refills | Status: DC

## 2017-08-26 MED ORDER — OMEPRAZOLE 20 MG PO CPDR: 20.0000 mg | DELAYED_RELEASE_CAPSULE | Freq: Every day | ORAL | 3 refills | Status: DC
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
## 2017-08-27 ENCOUNTER — Ambulatory Visit: Payer: Self-pay | Admitting: Family Medicine

## 2017-08-28 ENCOUNTER — Emergency Department (HOSPITAL_COMMUNITY): Payer: Medicaid Other

## 2017-08-28 ENCOUNTER — Emergency Department (HOSPITAL_COMMUNITY)
Admission: EM | Admit: 2017-08-28 | Discharge: 2017-08-28 | Disposition: A | Discharge status: Home / Self Care | Payer: Medicaid Other | Attending: Emergency Medicine | Admitting: Emergency Medicine

## 2017-08-28 ENCOUNTER — Encounter (HOSPITAL_COMMUNITY): Payer: Self-pay | Admitting: Family Medicine

## 2017-08-28 DIAGNOSIS — S62102A Fracture of unspecified carpal bone, left wrist, initial encounter for closed fracture: Secondary | ICD-10-CM

## 2017-08-28 DIAGNOSIS — Y9351 Activity, roller skating (inline) and skateboarding: Secondary | ICD-10-CM | POA: Insufficient documentation

## 2017-08-28 DIAGNOSIS — F17210 Nicotine dependence, cigarettes, uncomplicated: Secondary | ICD-10-CM | POA: Diagnosis not present

## 2017-08-28 DIAGNOSIS — S52552A Other extraarticular fracture of lower end of left radius, initial encounter for closed fracture: Secondary | ICD-10-CM | POA: Diagnosis not present

## 2017-08-28 DIAGNOSIS — Y999 Unspecified external cause status: Secondary | ICD-10-CM | POA: Insufficient documentation

## 2017-08-28 DIAGNOSIS — Y929 Unspecified place or not applicable: Secondary | ICD-10-CM | POA: Diagnosis not present

## 2017-08-28 DIAGNOSIS — S6992XA Unspecified injury of left wrist, hand and finger(s), initial encounter: Secondary | ICD-10-CM | POA: Diagnosis present

## 2017-08-28 MED ORDER — HYDROCODONE-ACETAMINOPHEN 5-325 MG PO TABS: 2.0000 | ORAL_TABLET | ORAL | 0 refills | Status: DC | PRN

## 2017-08-28 MED ORDER — OXYCODONE-ACETAMINOPHEN 5-325 MG PO TABS
1.0000 | ORAL_TABLET | Freq: Once | ORAL | Status: AC
  Administered 2017-08-28: 1 via ORAL
  Filled 2017-08-28: qty 1

## 2017-08-28 MED ORDER — ONDANSETRON 4 MG PO TBDP
4.0000 mg | ORAL_TABLET | Freq: Once | ORAL | Status: AC
  Administered 2017-08-28: 4 mg via ORAL
  Filled 2017-08-28: qty 1

## 2017-08-28 NOTE — ED Notes (Signed)
Bed: WTR8 Expected date:  Expected time:  Means of arrival:  Comments: 

## 2017-08-28 NOTE — ED Triage Notes (Signed)
Patient reports she was rolling skating last night when she fell. Patient reports she fell backwards and tried to catch herself with her left arm. She already has a soft cast to the right arm. Patient is complaining left wrist and elbow pain. Strong pulse noted in the right radial.

## 2017-08-28 NOTE — Discharge Instructions (Signed)
The x-rays show possible nondisplaced fracture of the left distal radius.  Please call and schedule an appointment with your orthopedic doctor for follow-up of your wrist injury today.  You can take 600 mg ibuprofen every 6 hours as needed for pain.  I have also written you a prescription for norco which you can use for breakthrough pain.  This medicine can make you drowsy so please do not drive or drink alcohol while taking it.  Return to the emergency department if you have any new or worsening symptoms.

## 2017-08-28 NOTE — ED Notes (Signed)
Bed: WTR6 Expected date:  Expected time:  Means of arrival:  Comments: 
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
## 2017-09-13 ENCOUNTER — Telehealth: Payer: Self-pay | Admitting: Family Medicine

## 2017-09-13 NOTE — Telephone Encounter (Signed)
Pt called to request  -Seroquel xr 50 MG Tablet If approved she would like it sent to [facility] on [address] Please follow up.

## 2017-09-16 NOTE — Telephone Encounter (Signed)
She will have to obtain that from her prescribing physician. Seroquel is not currently on her med list.

## 2017-09-16 NOTE — Telephone Encounter (Signed)
Patient was called and a voicemail was left informing patient to return phone call. 

## 2017-09-17 NOTE — Telephone Encounter (Signed)
Patient was called and a voicemail was left informing patient to return phone call for lab results. 

## 2017-10-17 ENCOUNTER — Telehealth: Payer: Self-pay | Admitting: Family Medicine

## 2017-10-17 DIAGNOSIS — K2970 Gastritis, unspecified, without bleeding: Secondary | ICD-10-CM

## 2017-10-17 DIAGNOSIS — K2990 Gastroduodenitis, unspecified, without bleeding: Principal | ICD-10-CM

## 2017-10-17 MED ORDER — OMEPRAZOLE 20 MG PO CPDR: 20.0000 mg | DELAYED_RELEASE_CAPSULE | Freq: Every day | ORAL | 2 refills | Status: DC

## 2017-10-17 NOTE — Telephone Encounter (Signed)
Refilled

## 2017-10-17 NOTE — Telephone Encounter (Signed)
Patient called and requested for listed medications to be refilled  omeprazole (PRILOSEC) 20 MG capsule [[id number]]  Please send to [facility] on [address]

## 2017-10-21 ENCOUNTER — Encounter: Payer: Self-pay | Admitting: Podiatry

## 2017-10-21 NOTE — Progress Notes (Signed)
This encounter was created in error - please disregard.

## 2017-11-11 ENCOUNTER — Other Ambulatory Visit: Payer: Self-pay

## 2017-11-11 ENCOUNTER — Encounter (HOSPITAL_COMMUNITY): Payer: Self-pay | Admitting: Family Medicine

## 2017-11-11 ENCOUNTER — Ambulatory Visit (HOSPITAL_COMMUNITY)
Admission: EM | Admit: 2017-11-11 | Discharge: 2017-11-11 | Disposition: A | Discharge status: Home / Self Care | Payer: Medicaid Other | Attending: Family Medicine | Admitting: Family Medicine

## 2017-11-11 DIAGNOSIS — J440 Chronic obstructive pulmonary disease with acute lower respiratory infection: Principal | ICD-10-CM

## 2017-11-11 DIAGNOSIS — J209 Acute bronchitis, unspecified: Secondary | ICD-10-CM

## 2017-11-11 DIAGNOSIS — J438 Other emphysema: Secondary | ICD-10-CM

## 2017-11-11 MED ORDER — HYDROCODONE-HOMATROPINE 5-1.5 MG/5ML PO SYRP: 5.0000 mL | ORAL_SOLUTION | Freq: Four times a day (QID) | ORAL | 0 refills | Status: DC | PRN

## 2017-11-11 MED ORDER — PREDNISONE 20 MG PO TABS: ORAL_TABLET | ORAL | 0 refills | Status: DC

## 2017-11-11 MED ORDER — ALBUTEROL SULFATE HFA 108 (90 BASE) MCG/ACT IN AERS: 1.0000 | INHALATION_SPRAY | Freq: Four times a day (QID) | RESPIRATORY_TRACT | 3 refills | Status: DC | PRN

## 2017-11-11 NOTE — ED Provider Notes (Signed)
[facility]   [id number] 11/11/17 Arrival Time: 3   SUBJECTIVE:  Felicia Perkins is a 42 y.o. female who presents to the urgent care with complaint of chest tightness with shortness of breath in the context of asthma for the last 5 days.  She had a fever at first but it broke.  Muscle soreness in lower ribs with coughing.    Past Medical History:  Diagnosis Date  . Alcohol abuse   . Anxiety   . Bipolar 1 disorder ([place])   . PTSD (post-traumatic stress disorder)    Family History  Problem Relation Age of Onset  . Hypertension Mother   . Cancer Mother        breast cancer  . Hypertension Father   . Heart attack Cousin 30       died in 11s of MI   Social History   Socioeconomic History  . Marital status: Single    Spouse name: Not on file  . Number of children: Not on file  . Years of education: Not on file  . Highest education level: Not on file  Occupational History  . Not on file  Social Needs  . Financial resource strain: Not on file  . Food insecurity:    Worry: Not on file    Inability: Not on file  . Transportation needs:    Medical: Not on file    Non-medical: Not on file  Tobacco Use  . Smoking status: Heavy Tobacco Smoker    Packs/day: 0.50    Types: Cigarettes  . Smokeless tobacco: Never Used  . Tobacco comment: 6-7 cigs daily  Substance and Sexual Activity  . Alcohol use: Yes    Comment: Weekends.   . Drug use: No  . Sexual activity: Yes    Birth control/protection: Condom  Lifestyle  . Physical activity:    Days per week: Not on file    Minutes per session: Not on file  . Stress: Not on file  Relationships  . Social connections:    Talks on phone: Not on file    Gets together: Not on file    Attends religious service: Not on file    Active member of club or organization: Not on file    Attends meetings of clubs or organizations: Not on file    Relationship status: Not on file  . Intimate partner violence:    Fear of  current or ex partner: Not on file    Emotionally abused: Not on file    Physically abused: Not on file    Forced sexual activity: Not on file  Other Topics Concern  . Not on file  Social History Narrative  . Not on file   Current Facility-Administered Medications for the 11/11/17 encounter [facility] Encounter)  Medication  . betamethasone acetate-betamethasone sodium phosphate (CELESTONE) injection 3 mg   Current Meds  Medication Sig  . omeprazole (PRILOSEC) 20 MG capsule Take 1 capsule (20 mg total) by mouth daily.  . [DISCONTINUED] albuterol (PROVENTIL HFA;VENTOLIN HFA) 108 (90 Base) MCG/ACT inhaler Inhale 1-2 puffs into the lungs every 6 (six) hours as needed for wheezing or shortness of breath.   No Known Allergies    ROS: As per HPI, remainder of ROS negative.   OBJECTIVE:   Vitals:   11/11/17 1127 11/11/17 1135  BP: (!) 137/91   Pulse: 76   Resp: 20   Temp: 98 F (36.7 C)   TempSrc: Oral   SpO2: 99%  Weight:  111 lb (50.3 kg)  Height:  _0  (1.626 m)     General appearance: alert; no distress Eyes: PERRL; EOMI; conjunctiva normal HENT: normocephalic; atraumatic;  external ears normal without trauma; nasal mucosa normal; oral mucosa normal Neck: supple Lungs: bilateral wheezes on auscultation bilaterally Heart: regular rate and rhythm Back: no CVA tenderness Extremities: no cyanosis or edema; symmetrical with no gross deformities Skin: warm and dry Neurologic: normal gait; grossly normal Psychological: alert and cooperative; normal mood and affect      Labs:  Results for orders placed or performed in visit on 11/28/16  CBC with Differential/Platelet  Result Value Ref Range   WBC 7.1 3.4 - 10.8 x10E3/uL   RBC 4.40 3.77 - 5.28 x10E6/uL   Hemoglobin 14.3 11.1 - 15.9 g/dL   Hematocrit 43.2 34.0 - 46.6 %   MCV 98 (H) 79 - 97 fL   MCH 32.5 26.6 - 33.0 pg   MCHC 33.1 31.5 - 35.7 g/dL   RDW 13.7 12.3 - 15.4 %   Platelets 249 150 - 379 x10E3/uL    Neutrophils 34 Not Estab. %   Lymphs 48 Not Estab. %   Monocytes 12 Not Estab. %   Eos 4 Not Estab. %   Basos 2 Not Estab. %   Neutrophils Absolute 2.4 1.4 - 7.0 x10E3/uL   Lymphocytes Absolute 3.4 (H) 0.7 - 3.1 x10E3/uL   Monocytes Absolute 0.9 0.1 - 0.9 x10E3/uL   EOS (ABSOLUTE) 0.3 0.0 - 0.4 x10E3/uL   Basophils Absolute 0.2 0.0 - 0.2 x10E3/uL   Immature Granulocytes 0 Not Estab. %   Immature Grans (Abs) 0.0 0.0 - 0.1 x10E3/uL  CMP14+EGFR  Result Value Ref Range   Glucose 93 65 - 99 mg/dL   BUN 3 (L) 6 - 24 mg/dL   Creatinine, Ser 0.49 (L) 0.57 - 1.00 mg/dL   GFR calc non Af Amer 121 >59 mL/min/1.73   GFR calc Af Amer 139 >59 mL/min/1.73   BUN/Creatinine Ratio 6 (L) 9 - 23   Sodium 138 134 - 144 mmol/L   Potassium 4.1 3.5 - 5.2 mmol/L   Chloride 96 96 - 106 mmol/L   CO2 25 18 - 29 mmol/L   Calcium 10.1 8.7 - 10.2 mg/dL   Total Protein 7.1 6.0 - 8.5 g/dL   Albumin 3.9 3.5 - 5.5 g/dL   Globulin, Total 3.2 1.5 - 4.5 g/dL   Albumin/Globulin Ratio 1.2 1.2 - 2.2   Bilirubin Total 0.2 0.0 - 1.2 mg/dL   Alkaline Phosphatase 152 (H) 39 - 117 IU/L   AST 125 (H) 0 - 40 IU/L   ALT 49 (H) 0 - 32 IU/L  B. [place]dorfi Antibodies  Result Value Ref Range   [place] IgG/IgM Ab <0.91 0.00 - 0.90 ISR    Labs Reviewed - No data to display  No results found.     ASSESSMENT & PLAN:  1. COPD (chronic obstructive pulmonary disease) with acute bronchitis ([place])   2. Other emphysema ([place])     Meds ordered this encounter  Medications  . albuterol (PROVENTIL HFA;VENTOLIN HFA) 108 (90 Base) MCG/ACT inhaler    Sig: Inhale 1-2 puffs into the lungs every 6 (six) hours as needed for wheezing or shortness of breath.    Dispense:  1 Inhaler    Refill:  3  . predniSONE (DELTASONE) 20 MG tablet    Sig: Two daily with food    Dispense:  10 tablet    Refill:  0  .  HYDROcodone-homatropine (HYDROMET) 5-1.5 MG/5ML syrup    Sig: Take 5 mLs by mouth every 6 (six) hours as needed for cough.    Dispense:   60 mL    Refill:  0    Reviewed expectations re: course of current medical issues. Questions answered. Outlined signs and symptoms indicating need for more acute intervention. Patient verbalized understanding. After Visit Summary given.    Procedures:      Robyn Haber, MD 11/11/17 1141

## 2017-11-11 NOTE — ED Triage Notes (Signed)
Pt presents with complaints of congestion, sore throat and productive cough that is painful x 5 days.

## 2017-11-12 ENCOUNTER — Ambulatory Visit: Payer: Self-pay | Admitting: Family Medicine

## 2017-11-14 ENCOUNTER — Encounter (HOSPITAL_COMMUNITY): Payer: Self-pay | Admitting: Emergency Medicine

## 2017-11-14 ENCOUNTER — Other Ambulatory Visit: Payer: Self-pay

## 2017-11-14 ENCOUNTER — Ambulatory Visit (INDEPENDENT_AMBULATORY_CARE_PROVIDER_SITE_OTHER): Payer: Medicaid Other

## 2017-11-14 ENCOUNTER — Ambulatory Visit (HOSPITAL_COMMUNITY)
Admission: EM | Admit: 2017-11-14 | Discharge: 2017-11-14 | Disposition: A | Discharge status: Home / Self Care | Payer: Medicaid Other | Attending: Family Medicine | Admitting: Family Medicine

## 2017-11-14 DIAGNOSIS — S62646A Nondisplaced fracture of proximal phalanx of right little finger, initial encounter for closed fracture: Secondary | ICD-10-CM

## 2017-11-14 DIAGNOSIS — W108XXA Fall (on) (from) other stairs and steps, initial encounter: Secondary | ICD-10-CM

## 2017-11-14 MED ORDER — HYDROCODONE-ACETAMINOPHEN 5-325 MG PO TABS: 1.0000 | ORAL_TABLET | Freq: Four times a day (QID) | ORAL | 0 refills | Status: DC | PRN

## 2017-11-14 NOTE — ED Triage Notes (Signed)
Patient fell yesterday and injured right little finger.  Patient recently had a cast on this hand for an injury.  Unable to bend little finger and swelling and deformity visible

## 2017-11-14 NOTE — Discharge Instructions (Signed)
Be aware, pain medications may cause drowsiness. Please do not drive, operate heavy machinery or make important decisions while on this medication, it can cloud your judgement.  

## 2017-11-14 NOTE — ED Provider Notes (Signed)
[facility]   [id number] 11/14/17 Arrival Time: 7829  ASSESSMENT & PLAN:  1. Closed nondisplaced fracture of proximal phalanx of right little finger, initial encounter    Imaging: Dg Finger Little Right  Result Date: 11/14/2017 CLINICAL DATA:  [place] yesterday, swelling, pain and limited mobility involving RIGHT little finger EXAM: RIGHT LITTLE FINGER 2+V COMPARISON:  RIGHT hand radiographs 06/30/2017 FINDINGS: Diffuse osseous demineralization. Joint spaces preserved. Displaced oblique fracture at base of proximal phalanx RIGHT little finger. Suspicion of extension of a nondisplaced fracture plane into the fifth MCP joint. Questionable small erosion at the PIP joint. No additional acute fracture, dislocation or bone destruction. Healing posttraumatic deformity of the fifth metacarpal with apex ulnar angulation, acute on prior exam. IMPRESSION: Mildly displaced fracture at base of proximal phalanx RIGHT little finger with questionable extension into fifth MCP joint. Healing fracture fifth metacarpal shaft. Osseous demineralization with small erosion at the PIP joint question inflammatory arthropathy. Electronically Signed   By: Lavonia Dana M.D.   On: 11/14/2017 13:34    Meds ordered this encounter  Medications  . HYDROcodone-acetaminophen (NORCO/VICODIN) 5-325 MG tablet    Sig: Take 1 tablet by mouth every 6 (six) hours as needed for severe pain.    Dispense:  12 tablet    Refill:  0    [place] Controlled Substances Registry consulted for this patient. I feel the risk/benefit ratio today is favorable for proceeding with this prescription for a controlled substance. Medication sedation precautions given.  OTC analgesics as needed. Written information on fracture and splint care given. To arrange orthopaedic follow up within one week. May f/u here as needed.  Reviewed expectations re: course of current medical issues. Questions answered. Outlined signs and symptoms indicating need for  more acute intervention. Patient verbalized understanding. After Visit Summary given.  SUBJECTIVE: History from: patient. Felicia Perkins is a 42 y.o. female who reports persistent pain of her right 5th finger with swelling. Onset abrupt beginning 1 day ago. Injury/trama: yes, reports tripping on stairs; reached to catch herself and "jammed" finger against stairs. Immediate pain. Discomfort described as aching and throbbing without radiation. Extremity sensation changes or weakness: none. Self treatment: tried OTCs without relief of pain. She is right handed.  ROS: As per HPI.   OBJECTIVE:  Vitals:   11/14/17 1311  BP: 114/78  Pulse: (!) 102  Resp: 18  Temp: 98.1 F (36.7 C)  TempSrc: Oral  SpO2: 95%    General appearance: alert; no distress Extremities: no cyanosis or edema; symmetrical with no gross deformities; tenderness over her right 5th proximal finger with moderate swelling and mild bruising; ROM: limited by pain CV: normal extremity capillary refill Skin: warm and dry Neurologic: normal gait; normal symmetric reflexes in all extremities; normal sensation in all extremities Psychological: alert and cooperative; normal mood and affect  Imaging: Dg Finger Little Right  Result Date: 11/14/2017 CLINICAL DATA:  [place] yesterday, swelling, pain and limited mobility involving RIGHT little finger EXAM: RIGHT LITTLE FINGER 2+V COMPARISON:  RIGHT hand radiographs 06/30/2017 FINDINGS: Diffuse osseous demineralization. Joint spaces preserved. Displaced oblique fracture at base of proximal phalanx RIGHT little finger. Suspicion of extension of a nondisplaced fracture plane into the fifth MCP joint. Questionable small erosion at the PIP joint. No additional acute fracture, dislocation or bone destruction. Healing posttraumatic deformity of the fifth metacarpal with apex ulnar angulation, acute on prior exam. IMPRESSION: Mildly displaced fracture at base of proximal phalanx RIGHT little  finger with questionable extension into fifth MCP  joint. Healing fracture fifth metacarpal shaft. Osseous demineralization with small erosion at the PIP joint question inflammatory arthropathy. Electronically Signed   By: Lavonia Dana M.D.   On: 11/14/2017 13:34    No Known Allergies  Past Medical History:  Diagnosis Date  . Alcohol abuse   . Anxiety   . Bipolar 1 disorder ([place])   . PTSD (post-traumatic stress disorder)    Social History   Socioeconomic History  . Marital status: Single    Spouse name: Not on file  . Number of children: Not on file  . Years of education: Not on file  . Highest education level: Not on file  Occupational History  . Not on file  Social Needs  . Financial resource strain: Not on file  . Food insecurity:    Worry: Not on file    Inability: Not on file  . Transportation needs:    Medical: Not on file    Non-medical: Not on file  Tobacco Use  . Smoking status: Heavy Tobacco Smoker    Packs/day: 0.50    Types: Cigarettes  . Smokeless tobacco: Never Used  . Tobacco comment: 6-7 cigs daily  Substance and Sexual Activity  . Alcohol use: Yes    Comment: Weekends.   . Drug use: No  . Sexual activity: Yes    Birth control/protection: Condom  Lifestyle  . Physical activity:    Days per week: Not on file    Minutes per session: Not on file  . Stress: Not on file  Relationships  . Social connections:    Talks on phone: Not on file    Gets together: Not on file    Attends religious service: Not on file    Active member of club or organization: Not on file    Attends meetings of clubs or organizations: Not on file    Relationship status: Not on file  . Intimate partner violence:    Fear of current or ex partner: Not on file    Emotionally abused: Not on file    Physically abused: Not on file    Forced sexual activity: Not on file  Other Topics Concern  . Not on file  Social History Narrative  . Not on file   Family History  Problem  Relation Age of Onset  . Hypertension Mother   . Cancer Mother        breast cancer  . Hypertension Father   . Heart attack Cousin 30       died in 22s of MI   Past Surgical History:  Procedure Laterality Date  . ABDOMINAL HYSTERECTOMY    . FOOT SURGERY    . OTHER SURGICAL HISTORY     ovarian surgery  . TUBAL LIGATION        Vanessa Kick, MD 11/14/17 1352

## 2017-11-15 ENCOUNTER — Ambulatory Visit: Payer: Self-pay | Admitting: Family Medicine

## 2017-11-18 ENCOUNTER — Emergency Department (HOSPITAL_COMMUNITY)
Admission: EM | Admit: 2017-11-18 | Discharge: 2017-11-18 | Disposition: A | Discharge status: Home / Self Care | Payer: Medicaid Other | Source: Home / Self Care

## 2017-11-18 ENCOUNTER — Inpatient Hospital Stay (HOSPITAL_COMMUNITY)
Admission: EM | Admit: 2017-11-18 | Discharge: 2017-11-22 | DRG: 440 | Disposition: A | Discharge status: Home / Self Care | Payer: Medicaid Other | Attending: Internal Medicine | Admitting: Internal Medicine

## 2017-11-18 ENCOUNTER — Other Ambulatory Visit: Payer: Self-pay

## 2017-11-18 ENCOUNTER — Ambulatory Visit (HOSPITAL_COMMUNITY)
Admission: EM | Admit: 2017-11-18 | Discharge: 2017-11-18 | Disposition: A | Discharge status: Home / Self Care | Payer: Medicaid Other | Attending: Family Medicine | Admitting: Family Medicine

## 2017-11-18 ENCOUNTER — Encounter (HOSPITAL_COMMUNITY): Payer: Self-pay | Admitting: Neurology

## 2017-11-18 ENCOUNTER — Encounter (HOSPITAL_COMMUNITY): Payer: Self-pay | Admitting: *Deleted

## 2017-11-18 ENCOUNTER — Encounter (HOSPITAL_COMMUNITY): Payer: Self-pay | Admitting: Emergency Medicine

## 2017-11-18 DIAGNOSIS — K2990 Gastroduodenitis, unspecified, without bleeding: Secondary | ICD-10-CM | POA: Diagnosis present

## 2017-11-18 DIAGNOSIS — Z7951 Long term (current) use of inhaled steroids: Secondary | ICD-10-CM

## 2017-11-18 DIAGNOSIS — Z803 Family history of malignant neoplasm of breast: Secondary | ICD-10-CM

## 2017-11-18 DIAGNOSIS — Z5321 Procedure and treatment not carried out due to patient leaving prior to being seen by health care provider: Secondary | ICD-10-CM

## 2017-11-18 DIAGNOSIS — K8500 Idiopathic acute pancreatitis without necrosis or infection: Secondary | ICD-10-CM

## 2017-11-18 DIAGNOSIS — Y909 Presence of alcohol in blood, level not specified: Secondary | ICD-10-CM | POA: Diagnosis present

## 2017-11-18 DIAGNOSIS — Z9851 Tubal ligation status: Secondary | ICD-10-CM

## 2017-11-18 DIAGNOSIS — K2980 Duodenitis without bleeding: Secondary | ICD-10-CM | POA: Diagnosis present

## 2017-11-18 DIAGNOSIS — K852 Alcohol induced acute pancreatitis without necrosis or infection: Secondary | ICD-10-CM

## 2017-11-18 DIAGNOSIS — Z79899 Other long term (current) drug therapy: Secondary | ICD-10-CM

## 2017-11-18 DIAGNOSIS — F17210 Nicotine dependence, cigarettes, uncomplicated: Secondary | ICD-10-CM | POA: Diagnosis present

## 2017-11-18 DIAGNOSIS — R109 Unspecified abdominal pain: Secondary | ICD-10-CM

## 2017-11-18 DIAGNOSIS — R112 Nausea with vomiting, unspecified: Secondary | ICD-10-CM

## 2017-11-18 DIAGNOSIS — K2970 Gastritis, unspecified, without bleeding: Secondary | ICD-10-CM | POA: Diagnosis present

## 2017-11-18 DIAGNOSIS — Z90710 Acquired absence of both cervix and uterus: Secondary | ICD-10-CM

## 2017-11-18 DIAGNOSIS — F4310 Post-traumatic stress disorder, unspecified: Secondary | ICD-10-CM | POA: Diagnosis present

## 2017-11-18 DIAGNOSIS — E162 Hypoglycemia, unspecified: Secondary | ICD-10-CM | POA: Diagnosis not present

## 2017-11-18 DIAGNOSIS — Z7952 Long term (current) use of systemic steroids: Secondary | ICD-10-CM

## 2017-11-18 DIAGNOSIS — Z8249 Family history of ischemic heart disease and other diseases of the circulatory system: Secondary | ICD-10-CM

## 2017-11-18 DIAGNOSIS — J449 Chronic obstructive pulmonary disease, unspecified: Secondary | ICD-10-CM | POA: Diagnosis present

## 2017-11-18 DIAGNOSIS — Z716 Tobacco abuse counseling: Secondary | ICD-10-CM

## 2017-11-18 DIAGNOSIS — R1013 Epigastric pain: Secondary | ICD-10-CM | POA: Diagnosis not present

## 2017-11-18 DIAGNOSIS — K700 Alcoholic fatty liver: Secondary | ICD-10-CM | POA: Diagnosis present

## 2017-11-18 DIAGNOSIS — F1020 Alcohol dependence, uncomplicated: Secondary | ICD-10-CM | POA: Diagnosis present

## 2017-11-18 DIAGNOSIS — K7030 Alcoholic cirrhosis of liver without ascites: Secondary | ICD-10-CM | POA: Diagnosis present

## 2017-11-18 LAB — COMPREHENSIVE METABOLIC PANEL
ALBUMIN: 3.5 g/dL (ref 3.5–5.0)
ALK PHOS: 146 U/L — AB (ref 38–126)
ALT: 37 U/L (ref 14–54)
AST: 56 U/L — ABNORMAL HIGH (ref 15–41)
Anion gap: 14 (ref 5–15)
BUN: <5 mg/dL — ABNORMAL LOW (ref 6–20)
CALCIUM: 9.4 mg/dL (ref 8.9–10.3)
CHLORIDE: 99 mmol/L — AB (ref 101–111)
CO2: 23 mmol/L (ref 22–32)
CREATININE: 0.62 mg/dL (ref 0.44–1.00)
GFR calc Af Amer: >60 mL/min (ref >60)
GFR calc non Af Amer: >60 mL/min (ref >60)
GLUCOSE: 104 mg/dL — AB (ref 65–99)
Potassium: 4.3 mmol/L (ref 3.5–5.1)
SODIUM: 136 mmol/L (ref 135–145)
Total Bilirubin: 0.6 mg/dL (ref 0.3–1.2)
Total Protein: 7.2 g/dL (ref 6.5–8.1)

## 2017-11-18 LAB — CBC
HCT: 44.8 % (ref 36.0–46.0)
Hemoglobin: 15.2 g/dL — ABNORMAL HIGH (ref 12.0–15.0)
MCH: 33 pg (ref 26.0–34.0)
MCHC: 33.9 g/dL (ref 30.0–36.0)
MCV: 97.2 fL (ref 78.0–100.0)
Platelets: 341 10*3/uL (ref 150–400)
RBC: 4.61 MIL/uL (ref 3.87–5.11)
RDW: 12.6 % (ref 11.5–15.5)
WBC: 9.8 10*3/uL (ref 4.0–10.5)

## 2017-11-18 LAB — LIPASE, BLOOD: LIPASE: 91 U/L — AB (ref 11–51)

## 2017-11-18 MED ORDER — GI COCKTAIL ~~LOC~~
ORAL | Status: AC
  Filled 2017-11-18: qty 30

## 2017-11-18 MED ORDER — ONDANSETRON 4 MG PO TBDP
4.0000 mg | ORAL_TABLET | Freq: Once | ORAL | Status: AC
  Administered 2017-11-18: 4 mg via ORAL

## 2017-11-18 MED ORDER — HYDROMORPHONE HCL 1 MG/ML IJ SOLN
INTRAMUSCULAR | Status: AC
  Filled 2017-11-18: qty 2

## 2017-11-18 MED ORDER — GI COCKTAIL ~~LOC~~
30.0000 mL | Freq: Once | ORAL | Status: AC
  Administered 2017-11-18: 30 mL via ORAL

## 2017-11-18 MED ORDER — HYDROMORPHONE HCL 1 MG/ML IJ SOLN
2.0000 mg | Freq: Once | INTRAMUSCULAR | Status: AC
  Administered 2017-11-18: 2 mg via INTRAMUSCULAR

## 2017-11-18 NOTE — ED Provider Notes (Signed)
[facility] EMERGENCY DEPARTMENT Provider Note   CSN: [id number] Arrival date & time: 11/18/17  1517     History   Chief Complaint Chief Complaint  Patient presents with  . Abdominal Pain    HPI Felicia Perkins is a 42 y.o. female.  Pt presents to the ED today for abd pain, n/v.  She said the pain is in her epigastrium and radiates to her back.  She has never had pancreatitis in the past, but does drink alcohol occasionally on the weekend.  The pt went to urgent care earlier today and was sent here for further eval.  Labs were done there which showed a mild lipase elevation of 91.     Past Medical History:  Diagnosis Date  . Alcohol abuse   . Anxiety   . Bipolar 1 disorder ([place])   . PTSD (post-traumatic stress disorder)     Patient Active Problem List   Diagnosis Date Noted  . 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 Nondisplaced fracture of neck of fifth metacarpal bone, right hand, initial encounter for closed fracture 08/26/2017  . Alcoholic cirrhosis of liver without ascites ([place]) 10/15/2016  . Alcohol abuse 09/04/2016  . Gastritis and gastroduodenitis 09/04/2016  . Tobacco abuse 09/04/2016  . COPD (chronic obstructive pulmonary disease) ([place]) 09/04/2016  . Elevated liver enzymes 12/22/2014  . Abdominal pain, epigastric 12/16/2014  . Chest pain with moderate risk for cardiac etiology 12/15/2014  . Alcohol dependence ([place]) 12/16/2012    Class: Acute    Past Surgical History:  Procedure Laterality Date  . ABDOMINAL HYSTERECTOMY    . FOOT SURGERY    . OTHER SURGICAL HISTORY     ovarian surgery  . TUBAL LIGATION       OB History   None      Home Medications    Prior to Admission medications   Medication Sig Start Date End Date Taking? Authorizing Provider  albuterol (PROVENTIL HFA;VENTOLIN HFA) 108 (90 Base) MCG/ACT inhaler Inhale 1-2 puffs into the lungs every 6 (six) hours as needed for wheezing or shortness of breath. 11/11/17    Robyn Haber, MD  budesonide-formoterol (SYMBICORT) 160-4.5 MCG/ACT inhaler Inhale 2 puffs into the lungs 2 (two) times daily. 09/04/16   Charlott Rakes, MD  HYDROcodone-acetaminophen (NORCO/VICODIN) 5-325 MG tablet Take 1 tablet by mouth every 6 (six) hours as needed for severe pain. 11/14/17   Vanessa Kick, MD  HYDROcodone-homatropine (HYDROMET) 5-1.5 MG/5ML syrup Take 5 mLs by mouth every 6 (six) hours as needed for cough. 11/11/17   Robyn Haber, MD  Mirtazapine (REMERON PO) Take 1 tablet by mouth at bedtime.    [provider]  omeprazole (PRILOSEC) 20 MG capsule Take 1 capsule (20 mg total) by mouth daily. 10/17/17   Charlott Rakes, MD  phenylephrine-shark liver oil-mineral oil-petrolatum (PREPARATION H) 0.25-3-14-71.9 % rectal ointment Place 1 application rectally 2 (two) times daily as needed for hemorrhoids.    [provider]  predniSONE (DELTASONE) 20 MG tablet Two daily with food 11/11/17   Robyn Haber, MD  QUEtiapine Fumarate (SEROQUEL PO) Take by mouth.    [provider]    Family History Family History  Problem Relation Age of Onset  . Hypertension Mother   . Cancer Mother        breast cancer  . Hypertension Father   . Heart attack Cousin 30       died in 79s of MI    Social History Social History   Tobacco Use  .  Smoking status: Heavy Tobacco Smoker    Packs/day: 0.50    Types: Cigarettes  . Smokeless tobacco: Never Used  . Tobacco comment: 6-7 cigs daily  Substance Use Topics  . Alcohol use: Yes    Comment: Weekends.   . Drug use: No     Allergies   Patient has no known allergies.   Review of Systems Review of Systems  Gastrointestinal: Positive for abdominal pain, nausea and vomiting.  All other systems reviewed and are negative.    Physical Exam Updated Vital Signs BP (!) 164/113   Pulse 73   Temp 97.9 F (36.6 C) (Oral)   Resp 16   Ht _0  (1.626 m)   Wt 50.3 kg (111 lb)   SpO2 97%   BMI 19.05 kg/m    Physical Exam  Constitutional: She is oriented to person, place, and time. She appears well-developed and well-nourished.  HENT:  Head: Normocephalic and atraumatic.  Mouth/Throat: Oropharynx is clear and moist.  Eyes: Pupils are equal, round, and reactive to light. EOM are normal.  Cardiovascular: Normal rate, regular rhythm, normal heart sounds and intact distal pulses.  Pulmonary/Chest: Effort normal and breath sounds normal.  Abdominal: There is tenderness in the epigastric area.  Neurological: She is alert and oriented to person, place, and time.  Skin: Skin is warm and dry. Capillary refill takes less than 2 seconds.  Psychiatric: She has a normal mood and affect. Her behavior is normal.  Nursing note and vitals reviewed.    ED Treatments / Results  Labs (all labs ordered are listed, but only abnormal results are displayed) Labs Reviewed - No data to display   Labs from 12:41 today:  WBC 4.0 - 10.5 K/uL 9.8   RBC 3.87 - 5.11 MIL/uL 4.61   Hemoglobin 12.0 - 15.0 g/dL 15.2High    HCT 36.0 - 46.0 % 44.8   MCV 78.0 - 100.0 fL 97.2   MCH 26.0 - 34.0 pg 33.0   MCHC 30.0 - 36.0 g/dL 33.9   RDW 11.5 - 15.5 % 12.6   Platelets 150 - 400 K/uL 341    Lipase 11 - 51 U/L 91High     Sodium 135 - 145 mmol/L 136   Potassium 3.5 - 5.1 mmol/L 4.3   Chloride 101 - 111 mmol/L 99Low    CO2 22 - 32 mmol/L 23   Glucose, Bld 65 - 99 mg/dL 104High    BUN 6 - 20 mg/dL <5Low    Creatinine, Ser 0.44 - 1.00 mg/dL 0.62   Calcium 8.9 - 10.3 mg/dL 9.4   Total Protein 6.5 - 8.1 g/dL 7.2   Albumin 3.5 - 5.0 g/dL 3.5   AST 15 - 41 U/L 56High    ALT 14 - 54 U/L 37   Alkaline Phosphatase 38 - 126 U/L 146High    Total Bilirubin 0.3 - 1.2 mg/dL 0.6   GFR calc non Af Amer >60 mL/min >60   GFR calc Af Amer >60 mL/min >60   Comment: (NOTE)  The eGFR has been calculated using the CKD EPI equation.  This calculation has not been validated in all clinical situations.  eGFR's persistently <60 mL/min  signify possible Chronic Kidney  Disease.   Anion gap 5 - 15 14      EKG None  Radiology No results found.  Procedures Procedures (including critical care time)  Medications Ordered in ED Medications - No data to display   Initial Impression / Assessment and Plan / ED  Course  I have reviewed the triage vital signs and the nursing notes.  Pertinent labs & imaging results that were available during my care of the patient were reviewed by me and considered in my medical decision making (see chart for details).   CT abd/pelvis ordered which is pending.  Pt signed out to Dr. Randal Buba pending results.  Final Clinical Impressions(s) / ED Diagnoses   Final diagnoses:  Epigastric pain  Non-intractable vomiting with nausea, unspecified vomiting type    ED Discharge Orders    None       Isla Pence, MD 11/18/17 2332

## 2017-11-18 NOTE — ED Provider Notes (Signed)
[place]   [id number] 11/18/17 Arrival Time: [place] PLAN:  1. Epigastric pain   2. Alcohol-induced acute pancreatitis, unspecified complication status    Meds ordered this encounter  Medications  . gi cocktail (Maalox,Lidocaine,Donnatal)  . ondansetron (ZOFRAN-ODT) disintegrating tablet 4 mg  . HYDROmorphone (DILAUDID) injection 2 mg   Sending Ms. Lannom to the Emergency Department for further evaluation of likely pancreatitis.  Stable upon discharge.  Reviewed expectations re: course of current medical issues. Questions answered. Outlined signs and symptoms indicating need for more acute intervention. Patient verbalized understanding. After Visit Summary given.   SUBJECTIVE:  Felicia Perkins is a 42 y.o. female who presents with complaint of persistent abdominal discomfort. Onset abrupt, yesterday. Location: epigastric with radiation to her back. Described as sharp and stabbing. Symptoms are gradually worsening since beginning. Aggravating factors: eating. Alleviating factors: none. Associated symptoms: nausea and non-bloody emesis. She denies fever. Appetite: decreased. PO intake: decreased. Ambulatory without assistance. Urinary symptoms: none. Last bowel movement 1-2 days ago without blood. OTC treatment: analgesics without relief.  Reports heavier than normal alcohol use over the weekend; mainly beer. H/O alcohol abuse. No h/o pancreatitis reported.  Went to the ED this morning but LWBS secondary to wait time.  No LMP recorded. Patient has had a hysterectomy.   Past Surgical History:  Procedure Laterality Date  . ABDOMINAL HYSTERECTOMY    . FOOT SURGERY    . OTHER SURGICAL HISTORY     ovarian surgery  . TUBAL LIGATION     ROS: As per HPI.  OBJECTIVE:  Vitals:   11/18/17 1333  BP: (!) 144/87  Pulse: 77  Resp: 18  Temp: 98.6 F (37 C)  TempSrc: Oral  SpO2: 98%    General appearance: alert; appears to be in pain Lungs: clear to  auscultation bilaterally Heart: regular rate and rhythm Abdomen: soft; non-distended; moderate tenderness over epigastric distribution; bowel sounds present; no masses or organomegaly; no guarding Back: no CVA tenderness; FROM at hips Extremities: no edema; symmetrical with no gross deformities Skin: warm and dry Neurologic: normal gait Psychological: alert and cooperative; normal mood and affect  Labs: Results for orders placed or performed during the hospital encounter of 11/18/17  Lipase, blood  Result Value Ref Range   Lipase 91 (H) 11 - 51 U/L  Comprehensive metabolic panel  Result Value Ref Range   Sodium 136 135 - 145 mmol/L   Potassium 4.3 3.5 - 5.1 mmol/L   Chloride 99 (L) 101 - 111 mmol/L   CO2 23 22 - 32 mmol/L   Glucose, Bld 104 (H) 65 - 99 mg/dL   BUN <5 (L) 6 - 20 mg/dL   Creatinine, Ser 0.62 0.44 - 1.00 mg/dL   Calcium 9.4 8.9 - 10.3 mg/dL   Total Protein 7.2 6.5 - 8.1 g/dL   Albumin 3.5 3.5 - 5.0 g/dL   AST 56 (H) 15 - 41 U/L   ALT 37 14 - 54 U/L   Alkaline Phosphatase 146 (H) 38 - 126 U/L   Total Bilirubin 0.6 0.3 - 1.2 mg/dL   GFR calc non Af Amer >60 >60 mL/min   GFR calc Af Amer >60 >60 mL/min   Anion gap 14 5 - 15  CBC  Result Value Ref Range   WBC 9.8 4.0 - 10.5 K/uL   RBC 4.61 3.87 - 5.11 MIL/uL   Hemoglobin 15.2 (H) 12.0 - 15.0 g/dL   HCT 44.8 36.0 - 46.0 %   MCV 97.2  78.0 - 100.0 fL   MCH 33.0 26.0 - 34.0 pg   MCHC 33.9 30.0 - 36.0 g/dL   RDW 12.6 11.5 - 15.5 %   Platelets 341 150 - 400 K/uL   No Known Allergies                                             Past Medical History:  Diagnosis Date  . Alcohol abuse   . Anxiety   . Bipolar 1 disorder ([place])   . PTSD (post-traumatic stress disorder)    Social History   Socioeconomic History  . Marital status: Single    Spouse name: Not on file  . Number of children: Not on file  . Years of education: Not on file  . Highest education level: Not on file  Occupational History  . Not on file    Social Needs  . Financial resource strain: Not on file  . Food insecurity:    Worry: Not on file    Inability: Not on file  . Transportation needs:    Medical: Not on file    Non-medical: Not on file  Tobacco Use  . Smoking status: Heavy Tobacco Smoker    Packs/day: 0.50    Types: Cigarettes  . Smokeless tobacco: Never Used  . Tobacco comment: 6-7 cigs daily  Substance and Sexual Activity  . Alcohol use: Yes    Comment: Weekends.   . Drug use: No  . Sexual activity: Yes    Birth control/protection: Condom  Lifestyle  . Physical activity:    Days per week: Not on file    Minutes per session: Not on file  . Stress: Not on file  Relationships  . Social connections:    Talks on phone: Not on file    Gets together: Not on file    Attends religious service: Not on file    Active member of club or organization: Not on file    Attends meetings of clubs or organizations: Not on file    Relationship status: Not on file  . Intimate partner violence:    Fear of current or ex partner: Not on file    Emotionally abused: Not on file    Physically abused: Not on file    Forced sexual activity: Not on file  Other Topics Concern  . Not on file  Social History Narrative  . Not on file   Family History  Problem Relation Age of Onset  . Hypertension Mother   . Cancer Mother        breast cancer  . Hypertension Father   . Heart attack Cousin 30       died in 22s of MI     Vanessa Kick, MD 11/18/17 1451

## 2017-11-18 NOTE — ED Triage Notes (Signed)
Per ems- c/o abd pain since last night, feeling nauseated. Today her sister heard her vomiting, and they called 911. Small amount of emesis today. C/o epigastric pain. BP 142/94, HR 82, 18 RR, 96 % RA, CBG 137. Pt has been ambulatory. Has hx of GERD. rf

## 2017-11-18 NOTE — ED Triage Notes (Signed)
Pt sts vomiting x 2 days

## 2017-11-18 NOTE — ED Triage Notes (Signed)
Pt sent here from [facility] for LUQ pain to r/o pancreatitis.  States dizziness and nausea from shot she was given at [facility].

## 2017-11-18 NOTE — Discharge Instructions (Addendum)
Meds ordered this encounter  Medications   gi cocktail (Maalox,Lidocaine,Donnatal)   ondansetron (ZOFRAN-ODT) disintegrating tablet 4 mg   HYDROmorphone (DILAUDID) injection 2 mg

## 2017-11-18 NOTE — ED Provider Notes (Signed)
Patient placed in Quick Look pathway, seen and evaluated   Chief Complaint: Abdominal Pain   HPI:  42 y.o. F who presents for evaluation of epigastric abdominal pain that has been going on for the last 2 days.  Associated with nonbloody, nonbilious vomiting.  No diarrhea.  Seen at urgent care earlier today for evaluation of pain and was sent to the emergency department for further evaluation.  Additionally, patient reports she had an injection of pain medication which made her dizzy.  Patient denies any fevers, chest pain, difficulty breathing.  Patient does report on the weekends, she drinks alcohol.  Patient reports that she will drink sometimes two 40s.    ROS: Abdominal pain   Physical Exam:   Gen: No distress  Neuro: Awake and Alert  Skin: Warm    Focused Exam: Tenderness palpation noted to the epigastric and left upper quadrant.  No rigidity, guarding.  Labs reviewed from [facility] urgent care that was done earlier today.  CBC shows no leukocytosis.  No evidence of anemia.  CMP shows slight bump in AST. Lipase elevated at 91.    Initiation of care has begun. The patient has been counseled on the process, plan, and necessity for staying for the completion/evaluation, and the remainder of the medical screening examination    Volanda Napoleon, PA-C 11/18/17 1639    Isla Pence, MD 11/18/17 [phone]

## 2017-11-19 ENCOUNTER — Encounter (HOSPITAL_COMMUNITY): Payer: Self-pay | Admitting: Family Medicine

## 2017-11-19 ENCOUNTER — Emergency Department (HOSPITAL_COMMUNITY): Payer: Medicaid Other

## 2017-11-19 DIAGNOSIS — Z803 Family history of malignant neoplasm of breast: Secondary | ICD-10-CM | POA: Diagnosis not present

## 2017-11-19 DIAGNOSIS — E162 Hypoglycemia, unspecified: Secondary | ICD-10-CM | POA: Diagnosis not present

## 2017-11-19 DIAGNOSIS — J449 Chronic obstructive pulmonary disease, unspecified: Secondary | ICD-10-CM | POA: Diagnosis present

## 2017-11-19 DIAGNOSIS — K852 Alcohol induced acute pancreatitis without necrosis or infection: Secondary | ICD-10-CM | POA: Diagnosis not present

## 2017-11-19 DIAGNOSIS — K2990 Gastroduodenitis, unspecified, without bleeding: Secondary | ICD-10-CM | POA: Diagnosis not present

## 2017-11-19 DIAGNOSIS — Z716 Tobacco abuse counseling: Secondary | ICD-10-CM

## 2017-11-19 DIAGNOSIS — K2970 Gastritis, unspecified, without bleeding: Secondary | ICD-10-CM | POA: Diagnosis not present

## 2017-11-19 DIAGNOSIS — Z8249 Family history of ischemic heart disease and other diseases of the circulatory system: Secondary | ICD-10-CM | POA: Diagnosis not present

## 2017-11-19 DIAGNOSIS — Z7951 Long term (current) use of inhaled steroids: Secondary | ICD-10-CM | POA: Diagnosis not present

## 2017-11-19 DIAGNOSIS — F1029 Alcohol dependence with unspecified alcohol-induced disorder: Secondary | ICD-10-CM

## 2017-11-19 DIAGNOSIS — J438 Other emphysema: Secondary | ICD-10-CM

## 2017-11-19 DIAGNOSIS — F4310 Post-traumatic stress disorder, unspecified: Secondary | ICD-10-CM | POA: Diagnosis present

## 2017-11-19 DIAGNOSIS — F1020 Alcohol dependence, uncomplicated: Secondary | ICD-10-CM | POA: Diagnosis present

## 2017-11-19 DIAGNOSIS — Z7952 Long term (current) use of systemic steroids: Secondary | ICD-10-CM | POA: Diagnosis not present

## 2017-11-19 DIAGNOSIS — Y909 Presence of alcohol in blood, level not specified: Secondary | ICD-10-CM | POA: Diagnosis present

## 2017-11-19 DIAGNOSIS — Z9851 Tubal ligation status: Secondary | ICD-10-CM | POA: Diagnosis not present

## 2017-11-19 DIAGNOSIS — F17210 Nicotine dependence, cigarettes, uncomplicated: Secondary | ICD-10-CM | POA: Diagnosis present

## 2017-11-19 DIAGNOSIS — R112 Nausea with vomiting, unspecified: Secondary | ICD-10-CM | POA: Diagnosis present

## 2017-11-19 DIAGNOSIS — Z79899 Other long term (current) drug therapy: Secondary | ICD-10-CM | POA: Diagnosis not present

## 2017-11-19 DIAGNOSIS — K7030 Alcoholic cirrhosis of liver without ascites: Secondary | ICD-10-CM | POA: Diagnosis present

## 2017-11-19 DIAGNOSIS — K700 Alcoholic fatty liver: Secondary | ICD-10-CM | POA: Diagnosis present

## 2017-11-19 DIAGNOSIS — Z90710 Acquired absence of both cervix and uterus: Secondary | ICD-10-CM | POA: Diagnosis not present

## 2017-11-19 LAB — HIV ANTIBODY (ROUTINE TESTING W REFLEX): HIV Screen 4th Generation wRfx: NONREACTIVE

## 2017-11-19 LAB — COMPREHENSIVE METABOLIC PANEL
ALBUMIN: 3 g/dL — AB (ref 3.5–5.0)
ALT: 28 U/L (ref 14–54)
AST: 40 U/L (ref 15–41)
Alkaline Phosphatase: 119 U/L (ref 38–126)
Anion gap: 12 (ref 5–15)
BILIRUBIN TOTAL: 1.4 mg/dL — AB (ref 0.3–1.2)
CO2: 24 mmol/L (ref 22–32)
CREATININE: 0.58 mg/dL (ref 0.44–1.00)
Calcium: 9.1 mg/dL (ref 8.9–10.3)
Chloride: 97 mmol/L — ABNORMAL LOW (ref 101–111)
GFR calc Af Amer: >60 mL/min (ref >60)
GFR calc non Af Amer: >60 mL/min (ref >60)
GLUCOSE: 86 mg/dL (ref 65–99)
Potassium: 3.8 mmol/L (ref 3.5–5.1)
Sodium: 133 mmol/L — ABNORMAL LOW (ref 135–145)
Total Protein: 6.1 g/dL — ABNORMAL LOW (ref 6.5–8.1)

## 2017-11-19 LAB — CBC WITH DIFFERENTIAL/PLATELET
Basophils Absolute: 0 10*3/uL (ref 0.0–0.1)
Basophils Relative: 0 %
EOS ABS: 0.1 10*3/uL (ref 0.0–0.7)
EOS PCT: 1 %
HCT: 39 % (ref 36.0–46.0)
Hemoglobin: 13.3 g/dL (ref 12.0–15.0)
LYMPHS ABS: 2.9 10*3/uL (ref 0.7–4.0)
Lymphocytes Relative: 39 %
MCH: 33.3 pg (ref 26.0–34.0)
MCHC: 34.1 g/dL (ref 30.0–36.0)
MCV: 97.7 fL (ref 78.0–100.0)
MONO ABS: 0.8 10*3/uL (ref 0.1–1.0)
Monocytes Relative: 11 %
Neutro Abs: 3.6 10*3/uL (ref 1.7–7.7)
Neutrophils Relative %: 49 %
PLATELETS: 282 10*3/uL (ref 150–400)
RBC: 3.99 MIL/uL (ref 3.87–5.11)
RDW: 12.5 % (ref 11.5–15.5)
WBC: 7.3 10*3/uL (ref 4.0–10.5)

## 2017-11-19 LAB — LIPASE, BLOOD: LIPASE: 102 U/L — AB (ref 11–51)

## 2017-11-19 LAB — GLUCOSE, CAPILLARY: GLUCOSE-CAPILLARY: 77 mg/dL (ref 65–99)

## 2017-11-19 LAB — TRIGLYCERIDES: Triglycerides: 59 mg/dL (ref <150)

## 2017-11-19 MED ORDER — LORAZEPAM 2 MG/ML IJ SOLN
0.0000 mg | Freq: Four times a day (QID) | INTRAMUSCULAR | Status: AC
  Administered 2017-11-19: 1 mg via INTRAVENOUS

## 2017-11-19 MED ORDER — LORAZEPAM 2 MG/ML IJ SOLN: 0.0000 mg | Freq: Two times a day (BID) | INTRAMUSCULAR | Status: DC

## 2017-11-19 MED ORDER — THIAMINE HCL 100 MG/ML IJ SOLN
100.0000 mg | Freq: Every day | INTRAMUSCULAR | Status: DC
  Filled 2017-11-19: qty 2

## 2017-11-19 MED ORDER — SODIUM CHLORIDE 0.9 % IV SOLN
INTRAVENOUS | Status: DC
  Administered 2017-11-19: 20:00:00 via INTRAVENOUS

## 2017-11-19 MED ORDER — HYDRALAZINE HCL 20 MG/ML IJ SOLN: 10.0000 mg | INTRAMUSCULAR | Status: DC | PRN

## 2017-11-19 MED ORDER — FOLIC ACID 1 MG PO TABS
1.0000 mg | ORAL_TABLET | Freq: Every day | ORAL | Status: DC
  Administered 2017-11-19 – 2017-11-22 (×4): 1 mg via ORAL
  Filled 2017-11-19 (×4): qty 1

## 2017-11-19 MED ORDER — SODIUM CHLORIDE 0.9 % IV SOLN
INTRAVENOUS | Status: AC
  Administered 2017-11-19 (×2): via INTRAVENOUS

## 2017-11-19 MED ORDER — PANTOPRAZOLE SODIUM 40 MG IV SOLR
40.0000 mg | Freq: Two times a day (BID) | INTRAVENOUS | Status: DC
Start: 2017-11-22 — End: 2017-11-20

## 2017-11-19 MED ORDER — ADULT MULTIVITAMIN W/MINERALS CH
1.0000 | ORAL_TABLET | Freq: Every day | ORAL | Status: DC
  Administered 2017-11-19 – 2017-11-22 (×4): 1 via ORAL
  Filled 2017-11-19 (×4): qty 1

## 2017-11-19 MED ORDER — MOMETASONE FURO-FORMOTEROL FUM 200-5 MCG/ACT IN AERO
2.0000 | INHALATION_SPRAY | Freq: Two times a day (BID) | RESPIRATORY_TRACT | Status: DC
  Administered 2017-11-19 – 2017-11-22 (×6): 2 via RESPIRATORY_TRACT
  Filled 2017-11-19: qty 8.8

## 2017-11-19 MED ORDER — FENTANYL CITRATE (PF) 100 MCG/2ML IJ SOLN
25.0000 ug | INTRAMUSCULAR | Status: DC | PRN
  Administered 2017-11-19 – 2017-11-22 (×17): 50 ug via INTRAVENOUS
  Filled 2017-11-19 (×18): qty 2

## 2017-11-19 MED ORDER — ACETAMINOPHEN 650 MG RE SUPP: 650.0000 mg | Freq: Four times a day (QID) | RECTAL | Status: DC | PRN

## 2017-11-19 MED ORDER — PIPERACILLIN-TAZOBACTAM 3.375 G IVPB 30 MIN
3.3750 g | Freq: Once | INTRAVENOUS | Status: AC
  Administered 2017-11-19: 3.375 g via INTRAVENOUS
  Filled 2017-11-19: qty 50

## 2017-11-19 MED ORDER — ONDANSETRON HCL 4 MG PO TABS: 4.0000 mg | ORAL_TABLET | Freq: Four times a day (QID) | ORAL | Status: DC | PRN

## 2017-11-19 MED ORDER — VITAMIN B-1 100 MG PO TABS
100.0000 mg | ORAL_TABLET | Freq: Every day | ORAL | Status: DC
  Administered 2017-11-19 – 2017-11-22 (×4): 100 mg via ORAL
  Filled 2017-11-19 (×4): qty 1

## 2017-11-19 MED ORDER — LORAZEPAM 1 MG PO TABS: 1.0000 mg | ORAL_TABLET | Freq: Four times a day (QID) | ORAL | Status: AC | PRN

## 2017-11-19 MED ORDER — KETOROLAC TROMETHAMINE 30 MG/ML IJ SOLN
30.0000 mg | Freq: Once | INTRAMUSCULAR | Status: AC
  Administered 2017-11-19: 30 mg via INTRAVENOUS
  Filled 2017-11-19: qty 1

## 2017-11-19 MED ORDER — IOPAMIDOL (ISOVUE-300) INJECTION 61%
100.0000 mL | Freq: Once | INTRAVENOUS | Status: AC | PRN
  Administered 2017-11-19: 100 mL via INTRAVENOUS

## 2017-11-19 MED ORDER — SODIUM CHLORIDE 0.9 % IV BOLUS
1000.0000 mL | Freq: Once | INTRAVENOUS | Status: AC
  Administered 2017-11-19: 1000 mL via INTRAVENOUS

## 2017-11-19 MED ORDER — SODIUM CHLORIDE 0.9 % IV SOLN
80.0000 mg | Freq: Once | INTRAVENOUS | Status: AC
  Administered 2017-11-19: 80 mg via INTRAVENOUS
  Filled 2017-11-19: qty 80

## 2017-11-19 MED ORDER — ACETAMINOPHEN 325 MG PO TABS: 650.0000 mg | ORAL_TABLET | Freq: Four times a day (QID) | ORAL | Status: DC | PRN

## 2017-11-19 MED ORDER — ONDANSETRON HCL 4 MG/2ML IJ SOLN
4.0000 mg | Freq: Four times a day (QID) | INTRAMUSCULAR | Status: DC | PRN
  Administered 2017-11-19 (×3): 4 mg via INTRAVENOUS
  Filled 2017-11-19 (×3): qty 2

## 2017-11-19 MED ORDER — ALBUTEROL SULFATE (2.5 MG/3ML) 0.083% IN NEBU: 3.0000 mL | INHALATION_SOLUTION | Freq: Four times a day (QID) | RESPIRATORY_TRACT | Status: DC | PRN

## 2017-11-19 MED ORDER — NICOTINE 21 MG/24HR TD PT24
21.0000 mg | MEDICATED_PATCH | Freq: Every day | TRANSDERMAL | Status: DC
  Administered 2017-11-19 – 2017-11-22 (×4): 21 mg via TRANSDERMAL
  Filled 2017-11-19 (×4): qty 1

## 2017-11-19 MED ORDER — IOPAMIDOL (ISOVUE-300) INJECTION 61%
INTRAVENOUS | Status: AC
  Filled 2017-11-19: qty 100

## 2017-11-19 MED ORDER — DICYCLOMINE HCL 10 MG/ML IM SOLN
20.0000 mg | Freq: Once | INTRAMUSCULAR | Status: AC
  Administered 2017-11-19: 20 mg via INTRAMUSCULAR
  Filled 2017-11-19: qty 2

## 2017-11-19 MED ORDER — LORAZEPAM 2 MG/ML IJ SOLN
1.0000 mg | Freq: Four times a day (QID) | INTRAMUSCULAR | Status: AC | PRN
  Filled 2017-11-19: qty 1

## 2017-11-19 MED ORDER — FENTANYL CITRATE (PF) 100 MCG/2ML IJ SOLN
50.0000 ug | Freq: Once | INTRAMUSCULAR | Status: AC
  Administered 2017-11-19: 50 ug via INTRAVENOUS

## 2017-11-19 MED ORDER — SODIUM CHLORIDE 0.9 % IV SOLN
8.0000 mg/h | INTRAVENOUS | Status: DC
  Administered 2017-11-19 (×2): 8 mg/h via INTRAVENOUS
  Filled 2017-11-19 (×5): qty 80

## 2017-11-19 NOTE — Progress Notes (Signed)
PROGRESS NOTE                                                                                                                                                                                                             Patient Demographics:    Felicia Perkins, is a 42 y.o. female, DOB - 27-Aug-1975, PPJ:[id number]  Admit date - 11/18/2017   Admitting Physician Vianne Bulls, MD  Outpatient Primary MD for the patient is Charlott Rakes, MD  LOS - 0  Outpatient Specialists:none  Chief Complaint  Patient presents with  . Abdominal Pain       Brief Narrative  42 year old female with alcohol dependence with fatty liver, gastritis/duodenitis, COPD, and ongoing tobacco use presented to the ED with epigastric pain for past 2 days.  Patient reports she was drinking more than her usual over the weekend (had several shots of tequila along with about 80 ounce of beer).  She then developed sharp severe pain in her epigastric area radiating to the back associated with nausea and one episode of nonbloody vomiting.  No diarrhea.  Also reported subjective fever.  No hematemesis or melena. Denies similar symptoms in the past.  The ED vitals were stable.  Blood work showed mildly elevated AST (56) and lipase of 91.  CT of the abdomen and pelvis showed small rim-enhancing structure medial to the second segment of duodenum and peripancreatic edema suggestive of either penetrating duodenal ulcer with secondary pancreatitis versus acute pancreatitis with a small fluid collection. General surgery was consulted by ED physician who recommended this is not perforation after reviewing the images.  Patient started on Protonix infusion and admitted to medical floor.    Subjective:   Patient still has ongoing epigastric pain responding to IV pain medication, no vomiting. Assessment/plan    Principal Problem:   Acute alcoholic pancreatitis ?  Peptic  ulcer disease Keep strict n.p.o.  CT reviewed by surgery on call and recommended there was no perforation. Continue pain control with as needed IV fentanyl, IV PPI, aggressive IV hydration, bowel rest and serial abdominal exam.  Lipase normal. If symptoms unimproved or worsened we will consult GI. Patient counseled strongly on alcohol cessation.   Active Problems:   Alcohol dependence ([place]) Monitor on CIWA.  Counseled strongly on cessation.    Gastritis and gastroduodenitis IV PPI Counseled on  smoking cessation.  Nicotine patch prescribed    COPD (chronic obstructive pulmonary disease) (H[facility]) Stable.      Code Status : Full code  Family Communication  : None at bedside  Disposition Plan  : Home once improved  Barriers For Discharge : Active symptoms  Consults  : None  Procedures  : CT abdomen   DVT Prophylaxis  :  Lovenox -   Lab Results  Component Value Date   PLT 282 11/19/2017    Antibiotics  :   Anti-infectives (From admission, onward)   Start     Dose/Rate Route Frequency Ordered Stop   11/19/17 0330  piperacillin-tazobactam (ZOSYN) IVPB 3.375 g     3.375 g 100 mL/hr over 30 Minutes Intravenous  Once 11/19/17 0328 11/19/17 0416        Objective:   Vitals:   11/19/17 0130 11/19/17 0348 11/19/17 0611 11/19/17 0955  BP: (!) 147/91 119/77 121/71   Pulse: (!) 57 60 (!) 51   Resp:  16 17   Temp:   98.4 F (36.9 C)   TempSrc:   Oral   SpO2: 100% 100% 100% 98%  Weight:      Height:        Wt Readings from Last 3 Encounters:  11/18/17 50.3 kg (111 lb)  11/11/17 50.3 kg (111 lb)  08/28/17 49.9 kg (110 lb)    No intake or output data in the 24 hours ending 11/19/17 1317   Physical Exam  Gen: not in distress HEENT: no pallor, dry mucosa, supple neck Chest: clear b/l, no added sounds CVS: N S1&S2, no murmurs, GI: soft, nondistended, bowel sounds present, epigastric tenderness to minimal pressure Musculoskeletal: warm, no edema     Data  Review:    CBC Recent Labs  Lab 11/18/17 1241 11/19/17 0420  WBC 9.8 7.3  HGB 15.2* 13.3  HCT 44.8 39.0  PLT 341 282  MCV 97.2 97.7  MCH 33.0 33.3  MCHC 33.9 34.1  RDW 12.6 12.5  LYMPHSABS  --  2.9  MONOABS  --  0.8  EOSABS  --  0.1  BASOSABS  --  0.0    Chemistries  Recent Labs  Lab 11/18/17 1241 11/19/17 0420  NA 136 133*  K 4.3 3.8  CL 99* 97*  CO2 23 24  GLUCOSE 104* 86  BUN <5* <5*  CREATININE 0.62 0.58  CALCIUM 9.4 9.1  AST 56* 40  ALT 37 28  ALKPHOS 146* 119  BILITOT 0.6 1.4*   ------------------------------------------------------------------------------------------------------------------ Recent Labs    11/19/17 0421  TRIG 59    No results found for: HGBA1C ------------------------------------------------------------------------------------------------------------------ No results for input(s): TSH, T4TOTAL, T3FREE, THYROIDAB in the last 72 hours.  Invalid input(s): FREET3 ------------------------------------------------------------------------------------------------------------------ No results for input(s): VITAMINB12, FOLATE, FERRITIN, TIBC, IRON, RETICCTPCT in the last 72 hours.  Coagulation profile No results for input(s): INR, PROTIME in the last 168 hours.  No results for input(s): DDIMER in the last 72 hours.  Cardiac Enzymes No results for input(s): CKMB, TROPONINI, MYOGLOBIN in the last 168 hours.  Invalid input(s): CK ------------------------------------------------------------------------------------------------------------------ No results found for: BNP  Inpatient Medications  Scheduled Meds: . folic acid  1 mg Oral Daily  . iopamidol      . LORazepam  0-4 mg Intravenous Q6H   Followed by  . [START ON 11/21/2017] LORazepam  0-4 mg Intravenous Q12H  . mometasone-formoterol  2 puff Inhalation BID  . multivitamin with minerals  1 tablet Oral Daily  . nicotine  21 mg Transdermal Daily  . [START ON 11/22/2017] pantoprazole   40 mg Intravenous Q12H  . thiamine  100 mg Oral Daily   Or  . thiamine  100 mg Intravenous Daily   Continuous Infusions: . sodium chloride 125 mL/hr at 11/19/17 1229  . pantoprozole (PROTONIX) infusion 8 mg/hr (11/19/17 0502)   PRN Meds:.acetaminophen **OR** acetaminophen, albuterol, fentaNYL (SUBLIMAZE) injection, hydrALAZINE, LORazepam **OR** LORazepam, ondansetron **OR** ondansetron (ZOFRAN) IV  Micro Results No results found for this or any previous visit (from the past 240 hour(s)).  Radiology Reports Ct Abdomen Pelvis W Contrast  Result Date: 11/19/2017 CLINICAL DATA:  42 y/o F; left upper quadrant abdominal pain and nausea. EXAM: CT ABDOMEN AND PELVIS WITH CONTRAST TECHNIQUE: Multidetector CT imaging of the abdomen and pelvis was performed using the standard protocol following bolus administration of intravenous contrast. CONTRAST:  164mL ISOVUE-300 IOPAMIDOL (ISOVUE-300) INJECTION 61% COMPARISON:  None. FINDINGS: Lower chest: No acute abnormality. Hepatobiliary: No focal liver abnormality is seen. No gallstones, gallbladder wall thickening, or biliary dilatation. Pancreas: There is mild edema surrounding head of pancreas and tail of pancreas. No pancreatic ductal dilatation. Spleen: Normal in size without focal abnormality. Adrenals/Urinary Tract: Adrenal glands are unremarkable. Kidneys are normal, without renal calculi, focal lesion, or hydronephrosis. Bladder is unremarkable. Stomach/Bowel: In the region of second segment of duodenum/pancreatic head there is a round rim enhancing structure measuring 11 x 10 x 13 mm (AP x ML x CC series 3, image 25 and series 7, image 61) which may have a neck to the wall of duodenum. Stomach is within normal limits. Appendix appears normal. Otherwise no evidence of bowel wall thickening, distention, or inflammatory changes. Vascular/Lymphatic: Aortic atherosclerosis. No enlarged abdominal or pelvic lymph nodes. Reproductive: Uterus and bilateral adnexa  are unremarkable. Other: No abdominal wall hernia or abnormality. No abdominopelvic ascites. Musculoskeletal: No acute or significant osseous findings. IMPRESSION: Small rim enhancing structure medial to second segment of duodenum and peripancreatic edema. Findings may represent a penetrating duodenal ulcer with secondary pancreatitis or acute pancreatitis with small acute peripancreatic collection. These results were called by telephone at the time of interpretation on 11/19/2017 at 3:25 am to Dr. Randal Buba, who verbally acknowledged these results. Electronically Signed   By: Kristine Garbe M.D.   On: 11/19/2017 03:30   Dg Finger Little Right  Result Date: 11/14/2017 CLINICAL DATA:  [place] yesterday, swelling, pain and limited mobility involving RIGHT little finger EXAM: RIGHT LITTLE FINGER 2+V COMPARISON:  RIGHT hand radiographs 06/30/2017 FINDINGS: Diffuse osseous demineralization. Joint spaces preserved. Displaced oblique fracture at base of proximal phalanx RIGHT little finger. Suspicion of extension of a nondisplaced fracture plane into the fifth MCP joint. Questionable small erosion at the PIP joint. No additional acute fracture, dislocation or bone destruction. Healing posttraumatic deformity of the fifth metacarpal with apex ulnar angulation, acute on prior exam. IMPRESSION: Mildly displaced fracture at base of proximal phalanx RIGHT little finger with questionable extension into fifth MCP joint. Healing fracture fifth metacarpal shaft. Osseous demineralization with small erosion at the PIP joint question inflammatory arthropathy. Electronically Signed   By: Lavonia Dana M.D.   On: 11/14/2017 13:34    Time Spent in minutes  25   Hilmar Moldovan M.D on 11/19/2017 at 1:17 PM  Between 7am to 7pm - Pager - [phone]  After 7pm go to [email or web address] - password [facility]  [facility] -  Office  [phone]

## 2017-11-19 NOTE — ED Notes (Signed)
Patient transported to CT 

## 2017-11-19 NOTE — H&P (Signed)
History and Physical    Felicia Perkins ZOX:[id number] DOB: 27-Nov-1975 DOA: 11/18/2017  PCP: Charlott Rakes, MD   Patient coming from: Home  Chief Complaint: Epigastric pain   HPI: Felicia Perkins is a 42 y.o. female with medical history significant for alcohol dependence, COPD, and gastritis/duodenitis, now presenting to the emergency department for evaluation of epigastric pain.  The patient reports drinking more heavily than usual over the weekend and then developing severe pain in the epigastrium yesterday.  She describes the pain as constant, radiating through to her back, sharp, severe, and without alleviating or exacerbating factors identified.  She reports associated nausea with one episode of nonbloody vomiting.  Denies diarrhea.  She reports having a subjective fever yesterday.  No chest pain or shortness of breath.  No melena or hematochezia.  ED Course: Upon arrival to the ED, patient is found to be afebrile, saturating well on room air, and with vitals otherwise normal.  Chemistry panel is notable for AST of 56 and lipase of 91.  CBC is unremarkable.  CT of the abdomen and pelvis reveals a small rim-enhancing structure medial to the second segment of the duodenum, and peripancreatic edema suggesting either penetrating duodenal ulcer with secondary pancreatitis or acute pancreatitis with small fluid collection.  Patient was given 80 mg IV Protonix and started on Protonix infusion, and also treated with analgesics.  General surgery was consulted by the ED physician and advised that this is not a perforation after reviewing the films.  Patient remains hemodynamically stable, in no apparent respiratory distress, and will be admitted to the medical-surgical unit for ongoing evaluation and management of suspected alcoholic pancreatitis.  Review of Systems:  All other systems reviewed and apart from HPI, are negative.  Past Medical History:  Diagnosis Date  . Alcohol abuse   . Anxiety     . Bipolar 1 disorder ([place])   . PTSD (post-traumatic stress disorder)     Past Surgical History:  Procedure Laterality Date  . ABDOMINAL HYSTERECTOMY    . FOOT SURGERY    . OTHER SURGICAL HISTORY     ovarian surgery  . TUBAL LIGATION       reports that she has been smoking cigarettes.  She has been smoking about 0.50 packs per day. She has never used smokeless tobacco. She reports that she drinks alcohol. She reports that she does not use drugs.  No Known Allergies  Family History  Problem Relation Age of Onset  . Hypertension Mother   . Cancer Mother        breast cancer  . Hypertension Father   . Heart attack Cousin 30       died in 40s of MI     Prior to Admission medications   Medication Sig Start Date End Date Taking? Authorizing Provider  albuterol (PROVENTIL HFA;VENTOLIN HFA) 108 (90 Base) MCG/ACT inhaler Inhale 1-2 puffs into the lungs every 6 (six) hours as needed for wheezing or shortness of breath. 11/11/17  Yes Robyn Haber, MD  alum & mag hydroxide-simeth (MAALOX/MYLANTA) 200-200-20 MG/5ML suspension Take 30 mLs by mouth every 6 (six) hours as needed for indigestion or heartburn.   Yes [provider]  budesonide-formoterol (SYMBICORT) 160-4.5 MCG/ACT inhaler Inhale 2 puffs into the lungs 2 (two) times daily. 09/04/16  Yes Charlott Rakes, MD  HYDROcodone-acetaminophen (NORCO/VICODIN) 5-325 MG tablet Take 1 tablet by mouth every 6 (six) hours as needed for severe pain. 11/14/17  Yes Vanessa Kick, MD  HYDROcodone-homatropine (HYDROMET) 5-1.5 MG/5ML  syrup Take 5 mLs by mouth every 6 (six) hours as needed for cough. 11/11/17  Yes Robyn Haber, MD  omeprazole (PRILOSEC) 20 MG capsule Take 1 capsule (20 mg total) by mouth daily. 10/17/17  Yes Charlott Rakes, MD    Physical Exam: Vitals:   11/18/17 1636 11/18/17 1852 11/19/17 0130 11/19/17 0348  BP:  (!) 164/113 (!) 147/91 119/77  Pulse:  73 (!) 57 60  Resp:  16  16  Temp:  97.9 F (36.6 C)    TempSrc:   Oral    SpO2:  97% 100% 100%  Weight: 50.3 kg (111 lb)     Height: 5\' 4"  (1.626 m)         Constitutional: NAD, calm, in apparent discomfort Eyes: PERTLA, lids and conjunctivae normal ENMT: Mucous membranes are moist. Posterior pharynx clear of any exudate or lesions.   Neck: normal, supple, no masses, no thyromegaly Respiratory: clear to auscultation bilaterally, no wheezing, no crackles. Normal respiratory effort.    Cardiovascular: S1 & S2 heard, regular rate and rhythm. No significant JVD. Abdomen: No distension, exquisite tenderness in epigastrium. No rebound pain or guarding. Bowel sounds active.  Musculoskeletal: no clubbing / cyanosis. No joint deformity upper and lower extremities.   Skin: no significant rashes, lesions, ulcers. Poor turgor. Neurologic: CN 2-12 grossly intact. Sensation intact. Strength 5/5 in all 4 limbs.  Psychiatric:  Alert and oriented x 3.  Calm and cooperative.     Labs on Admission: I have personally reviewed following labs and imaging studies  CBC: Recent Labs  Lab 11/18/17 1241  WBC 9.8  HGB 15.2*  HCT 44.8  MCV 97.2  PLT 732   Basic Metabolic Panel: Recent Labs  Lab 11/18/17 1241  NA 136  K 4.3  CL 99*  CO2 23  GLUCOSE 104*  BUN <5*  CREATININE 0.62  CALCIUM 9.4   GFR: Estimated Creatinine Clearance: 72 mL/min (by C-G formula based on SCr of 0.62 mg/dL). Liver Function Tests: Recent Labs  Lab 11/18/17 1241  AST 56*  ALT 37  ALKPHOS 146*  BILITOT 0.6  PROT 7.2  ALBUMIN 3.5   Recent Labs  Lab 11/18/17 1241  LIPASE 91*   No results for input(s): AMMONIA in the last 168 hours. Coagulation Profile: No results for input(s): INR, PROTIME in the last 168 hours. Cardiac Enzymes: No results for input(s): CKTOTAL, CKMB, CKMBINDEX, TROPONINI in the last 168 hours. BNP (last 3 results) No results for input(s): PROBNP in the last 8760 hours. HbA1C: No results for input(s): HGBA1C in the last 72 hours. CBG: No results for  input(s): GLUCAP in the last 168 hours. Lipid Profile: No results for input(s): CHOL, HDL, LDLCALC, TRIG, CHOLHDL, LDLDIRECT in the last 72 hours. Thyroid Function Tests: No results for input(s): TSH, T4TOTAL, FREET4, T3FREE, THYROIDAB in the last 72 hours. Anemia Panel: No results for input(s): VITAMINB12, FOLATE, FERRITIN, TIBC, IRON, RETICCTPCT in the last 72 hours. Urine analysis:    Component Value Date/Time   COLORURINE YELLOW 02/22/2016 1406   APPEARANCEUR CLOUDY (A) 02/22/2016 1406   LABSPEC 1.015 09/14/2016 1531   PHURINE 8.5 (H) 09/14/2016 1531   GLUCOSEU NEGATIVE 09/14/2016 1531   HGBUR NEGATIVE 09/14/2016 1531   BILIRUBINUR NEGATIVE 09/14/2016 1531   KETONESUR NEGATIVE 09/14/2016 1531   PROTEINUR NEGATIVE 09/14/2016 1531   UROBILINOGEN 1.0 09/14/2016 1531   NITRITE NEGATIVE 09/14/2016 1531   LEUKOCYTESUR LARGE (A) 09/14/2016 1531   Sepsis Labs: @LABRCNTIP (procalcitonin:4,lacticidven:4) )No results found for this or any previous  visit (from the past 240 hour(s)).   Radiological Exams on Admission: Ct Abdomen Pelvis W Contrast  Result Date: 11/19/2017 CLINICAL DATA:  42 y/o F; left upper quadrant abdominal pain and nausea. EXAM: CT ABDOMEN AND PELVIS WITH CONTRAST TECHNIQUE: Multidetector CT imaging of the abdomen and pelvis was performed using the standard protocol following bolus administration of intravenous contrast. CONTRAST:  172mL ISOVUE-300 IOPAMIDOL (ISOVUE-300) INJECTION 61% COMPARISON:  None. FINDINGS: Lower chest: No acute abnormality. Hepatobiliary: No focal liver abnormality is seen. No gallstones, gallbladder wall thickening, or biliary dilatation. Pancreas: There is mild edema surrounding head of pancreas and tail of pancreas. No pancreatic ductal dilatation. Spleen: Normal in size without focal abnormality. Adrenals/Urinary Tract: Adrenal glands are unremarkable. Kidneys are normal, without renal calculi, focal lesion, or hydronephrosis. Bladder is  unremarkable. Stomach/Bowel: In the region of second segment of duodenum/pancreatic head there is a round rim enhancing structure measuring 11 x 10 x 13 mm (AP x ML x CC series 3, image 25 and series 7, image 61) which may have a neck to the wall of duodenum. Stomach is within normal limits. Appendix appears normal. Otherwise no evidence of bowel wall thickening, distention, or inflammatory changes. Vascular/Lymphatic: Aortic atherosclerosis. No enlarged abdominal or pelvic lymph nodes. Reproductive: Uterus and bilateral adnexa are unremarkable. Other: No abdominal wall hernia or abnormality. No abdominopelvic ascites. Musculoskeletal: No acute or significant osseous findings. IMPRESSION: Small rim enhancing structure medial to second segment of duodenum and peripancreatic edema. Findings may represent a penetrating duodenal ulcer with secondary pancreatitis or acute pancreatitis with small acute peripancreatic collection. These results were called by telephone at the time of interpretation on 11/19/2017 at 3:25 am to Dr. Randal Buba, who verbally acknowledged these results. Electronically Signed   By: Kristine Garbe M.D.   On: 11/19/2017 03:30    EKG: Not performed.   Assessment/Plan   1. Acute alcoholic pancreatitis vs PUD  - Presents with 1-2 days of epigastric pain with nausea and one episode of non-bloody vomiting  - There is mild elevation in lipase, slight elevation in AST, and CT abd/pelvis with small fluid collection and peripancreatic edema suggesting either penetrating duodenal ulcer with secondary pancreatitis, or pancreatitis with fluid-collection  - Gen surgery consulted by EDP, reviewed films and advises this is not a perforation  - She was treated in ED with fentanyl, Toradol, Zofran, IV PPI, and empiric Zosyn  - Fluid-resuscitate with NS, continue bowel rest, continue prn analgesia, continue IV PPI, serial exams   2. Alcohol dependence  - No withdrawal signs/symptoms on admission    - Monitor with CIWA and prn Ativan, supplement vitamins, monitor electrolytes   3. COPD  - No wheezing or SOB on admission  - Continue ICS/LABA and prn albuterol    4. Hx of gastritis and duodenitis  - Managed at home with prn omeprazole  - Currently on IV Protonix    DVT prophylaxis: SCD's  Code Status: Full  Family Communication: Discussed with patient Consults called: Surgery  Admission status: Inpatient    Vianne Bulls, MD [facility] Pager [phone]  If 7PM-7AM, please contact night-coverage [email or web address] Password [email or web address]  11/19/2017, 4:05 AM

## 2017-11-19 NOTE — ED Notes (Signed)
Opyd, MD at bedside 

## 2017-11-20 ENCOUNTER — Ambulatory Visit: Payer: Self-pay

## 2017-11-20 DIAGNOSIS — F1020 Alcohol dependence, uncomplicated: Secondary | ICD-10-CM

## 2017-11-20 DIAGNOSIS — E162 Hypoglycemia, unspecified: Secondary | ICD-10-CM

## 2017-11-20 LAB — COMPREHENSIVE METABOLIC PANEL
ALK PHOS: 97 U/L (ref 38–126)
ALT: 22 U/L (ref 14–54)
AST: 31 U/L (ref 15–41)
Albumin: 2.6 g/dL — ABNORMAL LOW (ref 3.5–5.0)
Anion gap: 16 — ABNORMAL HIGH (ref 5–15)
CALCIUM: 8.6 mg/dL — AB (ref 8.9–10.3)
CO2: 17 mmol/L — ABNORMAL LOW (ref 22–32)
CREATININE: 0.66 mg/dL (ref 0.44–1.00)
Chloride: 104 mmol/L (ref 101–111)
GFR calc Af Amer: >60 mL/min (ref >60)
Glucose, Bld: 49 mg/dL — ABNORMAL LOW (ref 65–99)
Potassium: 4 mmol/L (ref 3.5–5.1)
Sodium: 137 mmol/L (ref 135–145)
TOTAL PROTEIN: 5.5 g/dL — AB (ref 6.5–8.1)
Total Bilirubin: 1.6 mg/dL — ABNORMAL HIGH (ref 0.3–1.2)

## 2017-11-20 LAB — GLUCOSE, CAPILLARY
GLUCOSE-CAPILLARY: 63 mg/dL — AB (ref 65–99)
Glucose-Capillary: 129 mg/dL — ABNORMAL HIGH (ref 65–99)

## 2017-11-20 MED ORDER — SODIUM CHLORIDE 0.9 % IV SOLN
INTRAVENOUS | Status: AC
  Administered 2017-11-20 – 2017-11-21 (×2): via INTRAVENOUS

## 2017-11-20 MED ORDER — SODIUM CHLORIDE 0.9 % IV SOLN: INTRAVENOUS | Status: DC

## 2017-11-20 MED ORDER — DEXTROSE 50 % IV SOLN
12.5000 g | Freq: Once | INTRAVENOUS | Status: AC
  Administered 2017-11-20: 12.5 g via INTRAVENOUS
  Filled 2017-11-20: qty 50

## 2017-11-20 MED ORDER — PANTOPRAZOLE SODIUM 40 MG IV SOLR
40.0000 mg | Freq: Two times a day (BID) | INTRAVENOUS | Status: DC
  Administered 2017-11-20 – 2017-11-22 (×4): 40 mg via INTRAVENOUS
  Filled 2017-11-20 (×4): qty 40

## 2017-11-20 NOTE — Plan of Care (Signed)
  Problem: Nutrition: Goal: Adequate nutrition will be maintained Outcome: Progressing   Problem: Pain Managment: Goal: General experience of comfort will improve Outcome: Progressing   

## 2017-11-20 NOTE — Progress Notes (Signed)
PROGRESS NOTE                                                                                                                                                                                                             Patient Demographics:    Felicia Perkins, is a 42 y.o. female, DOB - 1975-09-07, WYO:[id number]  Admit date - 11/18/2017   Admitting Physician Vianne Bulls, MD  Outpatient Primary MD for the patient is Charlott Rakes, MD  LOS - 1  Outpatient Specialists:none  Chief Complaint  Patient presents with  . Abdominal Pain       Brief Narrative  42 year old female with alcohol dependence with fatty liver, gastritis/duodenitis, COPD, and ongoing tobacco use presented to the ED with epigastric pain for past 2 days.  Patient reports she was drinking more than her usual over the weekend (had several shots of tequila along with about 80 ounce of beer).  She then developed sharp severe pain in her epigastric area radiating to the back associated with nausea and one episode of nonbloody vomiting.  No diarrhea.  Also reported subjective fever.  No hematemesis or melena. Denies similar symptoms in the past.  The ED vitals were stable.  Blood work showed mildly elevated AST (56) and lipase of 91.  CT of the abdomen and pelvis showed small rim-enhancing structure medial to the second segment of duodenum and peripancreatic edema suggestive of either penetrating duodenal ulcer with secondary pancreatitis versus acute pancreatitis with a small fluid collection. General surgery was consulted by ED physician who recommended this is not perforation after reviewing the images.  Patient started on Protonix infusion and admitted to medical floor.    Subjective:   Reports worsened abdominal pain after having bowel movements morning.  Pain had much improved in the last 24 hours.  No nausea or vomiting. Assessment/plan    Principal  Problem:   Acute alcoholic pancreatitis  CT reviewed by surgery on call and recommended there was no perforation. Continue pain control with as needed IV fentanyl, IV PPI, aggressive IV hydration, bowel rest and serial abdominal exam.  Lipase normal.  Start clear liquid.     Active Problems:   Alcohol dependence (HCC) No withdrawal symptoms.  Monitor on CIWA.  Counseled again on alcohol cessation.    Gastritis and gastroduodenitis Switch Protonix drip to IV  twice daily.  Outpatient referral GI for EGD.   Tobacco abuse Counseled strongly on smoking cessation.      COPD (chronic obstructive pulmonary disease) (HCC) Stable.  Hypoglycemia Mild.  Received half an amp of D50.  Start clears.    Code Status : Full code  Family Communication  : None at bedside  Disposition Plan  : Home once improved, possibly in the next few days if pain better and tolerating advanced diet.  Barriers For Discharge : Active symptoms  Consults  : None  Procedures  : CT abdomen   DVT Prophylaxis  :  Lovenox -   Lab Results  Component Value Date   PLT 282 11/19/2017    Antibiotics  :   Anti-infectives (From admission, onward)   Start     Dose/Rate Route Frequency Ordered Stop   11/19/17 0330  piperacillin-tazobactam (ZOSYN) IVPB 3.375 g     3.375 g 100 mL/hr over 30 Minutes Intravenous  Once 11/19/17 0328 11/19/17 0416        Objective:   Vitals:   11/19/17 1417 11/19/17 2206 11/20/17 0457 11/20/17 0838  BP: 128/71 105/78 113/84   Pulse: (!) 56 (!) 56 65   Resp: 18 16 18    Temp: 97.7 F (36.5 C) 97.8 F (36.6 C) 98.5 F (36.9 C)   TempSrc: Oral Oral Oral   SpO2: 100% 100% 100% 99%  Weight:      Height:        Wt Readings from Last 3 Encounters:  11/18/17 50.3 kg (111 lb)  11/11/17 50.3 kg (111 lb)  08/28/17 49.9 kg (110 lb)     Intake/Output Summary (Last 24 hours) at 11/20/2017 0909 Last data filed at 11/20/2017 0330 Gross per 24 hour  Intake 1439.84 ml  Output -    Net 1439.84 ml     Physical Exam General: in Distressed with pain HEENT: moist mucosa, supple neck Chest: clear b/l  CVS: NS1&S2, no murmurs GI: soft, ND, BS+, epigastric tenderness++ Musculoskeletal: warm, no edema   Data Review:    CBC Recent Labs  Lab 11/18/17 1241 11/19/17 0420  WBC 9.8 7.3  HGB 15.2* 13.3  HCT 44.8 39.0  PLT 341 282  MCV 97.2 97.7  MCH 33.0 33.3  MCHC 33.9 34.1  RDW 12.6 12.5  LYMPHSABS  --  2.9  MONOABS  --  0.8  EOSABS  --  0.1  BASOSABS  --  0.0    Chemistries  Recent Labs  Lab 11/18/17 1241 11/19/17 0420 11/20/17 0452  NA 136 133* 137  K 4.3 3.8 4.0  CL 99* 97* 104  CO2 23 24 17*  GLUCOSE 104* 86 49*  BUN <5* <5* <5*  CREATININE 0.62 0.58 0.66  CALCIUM 9.4 9.1 8.6*  AST 56* 40 31  ALT 37 28 22  ALKPHOS 146* 119 97  BILITOT 0.6 1.4* 1.6*   ------------------------------------------------------------------------------------------------------------------ Recent Labs    11/19/17 0421  TRIG 59    No results found for: HGBA1C ------------------------------------------------------------------------------------------------------------------ No results for input(s): TSH, T4TOTAL, T3FREE, THYROIDAB in the last 72 hours.  Invalid input(s): FREET3 ------------------------------------------------------------------------------------------------------------------ No results for input(s): VITAMINB12, FOLATE, FERRITIN, TIBC, IRON, RETICCTPCT in the last 72 hours.  Coagulation profile No results for input(s): INR, PROTIME in the last 168 hours.  No results for input(s): DDIMER in the last 72 hours.  Cardiac Enzymes No results for input(s): CKMB, TROPONINI, MYOGLOBIN in the last 168 hours.  Invalid input(s): CK ------------------------------------------------------------------------------------------------------------------ No results found for: BNP  Inpatient Medications  Scheduled Meds: . folic acid  1 mg Oral Daily  .  LORazepam  0-4 mg Intravenous Q6H   Followed by  . [START ON 11/21/2017] LORazepam  0-4 mg Intravenous Q12H  . mometasone-formoterol  2 puff Inhalation BID  . multivitamin with minerals  1 tablet Oral Daily  . nicotine  21 mg Transdermal Daily  . [START ON 11/22/2017] pantoprazole  40 mg Intravenous Q12H  . thiamine  100 mg Oral Daily   Or  . thiamine  100 mg Intravenous Daily   Continuous Infusions: . sodium chloride 100 mL/hr at 11/20/17 0757   PRN Meds:.acetaminophen **OR** acetaminophen, albuterol, fentaNYL (SUBLIMAZE) injection, hydrALAZINE, LORazepam **OR** LORazepam, ondansetron **OR** ondansetron (ZOFRAN) IV  Micro Results No results found for this or any previous visit (from the past 240 hour(s)).  Radiology Reports Ct Abdomen Pelvis W Contrast  Result Date: 11/19/2017 CLINICAL DATA:  42 y/o F; left upper quadrant abdominal pain and nausea. EXAM: CT ABDOMEN AND PELVIS WITH CONTRAST TECHNIQUE: Multidetector CT imaging of the abdomen and pelvis was performed using the standard protocol following bolus administration of intravenous contrast. CONTRAST:  168mL ISOVUE-300 IOPAMIDOL (ISOVUE-300) INJECTION 61% COMPARISON:  None. FINDINGS: Lower chest: No acute abnormality. Hepatobiliary: No focal liver abnormality is seen. No gallstones, gallbladder wall thickening, or biliary dilatation. Pancreas: There is mild edema surrounding head of pancreas and tail of pancreas. No pancreatic ductal dilatation. Spleen: Normal in size without focal abnormality. Adrenals/Urinary Tract: Adrenal glands are unremarkable. Kidneys are normal, without renal calculi, focal lesion, or hydronephrosis. Bladder is unremarkable. Stomach/Bowel: In the region of second segment of duodenum/pancreatic head there is a round rim enhancing structure measuring 11 x 10 x 13 mm (AP x ML x CC series 3, image 25 and series 7, image 61) which may have a neck to the wall of duodenum. Stomach is within normal limits. Appendix appears  normal. Otherwise no evidence of bowel wall thickening, distention, or inflammatory changes. Vascular/Lymphatic: Aortic atherosclerosis. No enlarged abdominal or pelvic lymph nodes. Reproductive: Uterus and bilateral adnexa are unremarkable. Other: No abdominal wall hernia or abnormality. No abdominopelvic ascites. Musculoskeletal: No acute or significant osseous findings. IMPRESSION: Small rim enhancing structure medial to second segment of duodenum and peripancreatic edema. Findings may represent a penetrating duodenal ulcer with secondary pancreatitis or acute pancreatitis with small acute peripancreatic collection. These results were called by telephone at the time of interpretation on 11/19/2017 at 3:25 am to Dr. Randal Buba, who verbally acknowledged these results. Electronically Signed   By: Kristine Garbe M.D.   On: 11/19/2017 03:30   Dg Finger Little Right  Result Date: 11/14/2017 CLINICAL DATA:  [place] yesterday, swelling, pain and limited mobility involving RIGHT little finger EXAM: RIGHT LITTLE FINGER 2+V COMPARISON:  RIGHT hand radiographs 06/30/2017 FINDINGS: Diffuse osseous demineralization. Joint spaces preserved. Displaced oblique fracture at base of proximal phalanx RIGHT little finger. Suspicion of extension of a nondisplaced fracture plane into the fifth MCP joint. Questionable small erosion at the PIP joint. No additional acute fracture, dislocation or bone destruction. Healing posttraumatic deformity of the fifth metacarpal with apex ulnar angulation, acute on prior exam. IMPRESSION: Mildly displaced fracture at base of proximal phalanx RIGHT little finger with questionable extension into fifth MCP joint. Healing fracture fifth metacarpal shaft. Osseous demineralization with small erosion at the PIP joint question inflammatory arthropathy. Electronically Signed   By: Lavonia Dana M.D.   On: 11/14/2017 13:34    Time Spent in minutes  25   Damiana Berrian  M.D on 11/20/2017 at 9:09  AM  Between 7am to 7pm - Pager - [phone]  After 7pm go to [email or web address] - password [facility]  [facility] -  Office  [phone]

## 2017-11-21 ENCOUNTER — Other Ambulatory Visit: Payer: Self-pay

## 2017-11-21 NOTE — Progress Notes (Signed)
PROGRESS NOTE                                                                                                                                                                                                             Patient Demographics:    Felicia Perkins, is a 42 y.o. female, DOB - 10/21/1975, MWN:[id number]  Admit date - 11/18/2017   Admitting Physician Vianne Bulls, MD  Outpatient Primary MD for the patient is Charlott Rakes, MD  LOS - 2  Outpatient Specialists:none  Chief Complaint  Patient presents with  . Abdominal Pain       Brief Narrative  42 year old female with alcohol dependence with fatty liver, gastritis/duodenitis, COPD, and ongoing tobacco use presented to the ED with epigastric pain for past 2 days.  Patient reports she was drinking more than her usual over the weekend (had several shots of tequila along with about 80 ounce of beer).  She then developed sharp severe pain in her epigastric area radiating to the back associated with nausea and one episode of nonbloody vomiting.  No diarrhea.  Also reported subjective fever.  No hematemesis or melena. Denies similar symptoms in the past.  The ED vitals were stable.  Blood work showed mildly elevated AST (56) and lipase of 91.  CT of the abdomen and pelvis showed small rim-enhancing structure medial to the second segment of duodenum and peripancreatic edema suggestive of either penetrating duodenal ulcer with secondary pancreatitis versus acute pancreatitis with a small fluid collection. General surgery was consulted by ED physician who recommended this is not perforation after reviewing the images.  Patient started on Protonix infusion and admitted to medical floor.    Subjective:   Abdominal pain slowly improving.  No nausea or vomiting.  Tolerating clears. Assessment/plan    Principal Problem:   Acute alcoholic pancreatitis  CT reviewed by surgery  on call and recommended there was no perforation. Continue pain control with as needed IV fentanyl, IV PPI,  IV hydration, bowel rest and serial abdominal exam.  Lipase normal.  Slowly improving.  Tolerating clears.  Advance to full liquid.     Active Problems:   Alcohol dependence (HCC) No withdrawal symptoms.  Monitor on CIWA.  Counseled again on alcohol cessation.    Gastritis and gastroduodenitis IV PPI twice daily.  Outpatient referral to GI for  EGD.   Tobacco abuse Counseled strongly on smoking cessation.      COPD (chronic obstructive pulmonary disease) (HCC) Stable.  Hypoglycemia Mild.  Received half an amp of D50.  Now on clears.    Code Status : Full code  Family Communication  : None at bedside  Disposition Plan  : Home possibly in the next 24-48 hours if tolerating advanced diet and feeling better.  Barriers For Discharge : Active symptoms  Consults  : None  Procedures  : CT abdomen   DVT Prophylaxis  :  Lovenox -   Lab Results  Component Value Date   PLT 282 11/19/2017    Antibiotics  :   Anti-infectives (From admission, onward)   Start     Dose/Rate Route Frequency Ordered Stop   11/19/17 0330  piperacillin-tazobactam (ZOSYN) IVPB 3.375 g     3.375 g 100 mL/hr over 30 Minutes Intravenous  Once 11/19/17 0328 11/19/17 0416        Objective:   Vitals:   11/20/17 1407 11/20/17 2101 11/21/17 0450 11/21/17 0924  BP: (!) 134/96 131/89 135/90   Pulse: 66 63 65   Resp: 20 16 18    Temp: 98.9 F (37.2 C) 98.1 F (36.7 C) 98.1 F (36.7 C)   TempSrc: Oral Oral Oral   SpO2: 100% 100% 100% 100%  Weight:      Height:        Wt Readings from Last 3 Encounters:  11/18/17 50.3 kg (111 lb)  11/11/17 50.3 kg (111 lb)  08/28/17 49.9 kg (110 lb)     Intake/Output Summary (Last 24 hours) at 11/21/2017 1253 Last data filed at 11/21/2017 0947 Gross per 24 hour  Intake 1655 ml  Output -  Net 1655 ml     Physical Exam General: Not in  distress HEENT: Moist mucosa, supple neck Chest: Clear b/l CVs: Normal S1 and S2, no murmurs Soft, nondistended, epigastric tenderness (improved from yesterday) Musculoskeletal: Warm, no edema    Data Review:    CBC Recent Labs  Lab 11/18/17 1241 11/19/17 0420  WBC 9.8 7.3  HGB 15.2* 13.3  HCT 44.8 39.0  PLT 341 282  MCV 97.2 97.7  MCH 33.0 33.3  MCHC 33.9 34.1  RDW 12.6 12.5  LYMPHSABS  --  2.9  MONOABS  --  0.8  EOSABS  --  0.1  BASOSABS  --  0.0    Chemistries  Recent Labs  Lab 11/18/17 1241 11/19/17 0420 11/20/17 0452  NA 136 133* 137  K 4.3 3.8 4.0  CL 99* 97* 104  CO2 23 24 17*  GLUCOSE 104* 86 49*  BUN <5* <5* <5*  CREATININE 0.62 0.58 0.66  CALCIUM 9.4 9.1 8.6*  AST 56* 40 31  ALT 37 28 22  ALKPHOS 146* 119 97  BILITOT 0.6 1.4* 1.6*   ------------------------------------------------------------------------------------------------------------------ Recent Labs    11/19/17 0421  TRIG 59    No results found for: HGBA1C ------------------------------------------------------------------------------------------------------------------ No results for input(s): TSH, T4TOTAL, T3FREE, THYROIDAB in the last 72 hours.  Invalid input(s): FREET3 ------------------------------------------------------------------------------------------------------------------ No results for input(s): VITAMINB12, FOLATE, FERRITIN, TIBC, IRON, RETICCTPCT in the last 72 hours.  Coagulation profile No results for input(s): INR, PROTIME in the last 168 hours.  No results for input(s): DDIMER in the last 72 hours.  Cardiac Enzymes No results for input(s): CKMB, TROPONINI, MYOGLOBIN in the last 168 hours.  Invalid input(s): CK ------------------------------------------------------------------------------------------------------------------ No results found for: BNP  Inpatient Medications  Scheduled Meds: . folic  acid  1 mg Oral Daily  . LORazepam  0-4 mg Intravenous  Q12H  . mometasone-formoterol  2 puff Inhalation BID  . multivitamin with minerals  1 tablet Oral Daily  . nicotine  21 mg Transdermal Daily  . pantoprazole  40 mg Intravenous Q12H  . thiamine  100 mg Oral Daily   Or  . thiamine  100 mg Intravenous Daily   Continuous Infusions:  PRN Meds:.acetaminophen **OR** acetaminophen, albuterol, fentaNYL (SUBLIMAZE) injection, hydrALAZINE, LORazepam **OR** LORazepam, ondansetron **OR** ondansetron (ZOFRAN) IV  Micro Results No results found for this or any previous visit (from the past 240 hour(s)).  Radiology Reports Ct Abdomen Pelvis W Contrast  Result Date: 11/19/2017 CLINICAL DATA:  42 y/o F; left upper quadrant abdominal pain and nausea. EXAM: CT ABDOMEN AND PELVIS WITH CONTRAST TECHNIQUE: Multidetector CT imaging of the abdomen and pelvis was performed using the standard protocol following bolus administration of intravenous contrast. CONTRAST:  183mL ISOVUE-300 IOPAMIDOL (ISOVUE-300) INJECTION 61% COMPARISON:  None. FINDINGS: Lower chest: No acute abnormality. Hepatobiliary: No focal liver abnormality is seen. No gallstones, gallbladder wall thickening, or biliary dilatation. Pancreas: There is mild edema surrounding head of pancreas and tail of pancreas. No pancreatic ductal dilatation. Spleen: Normal in size without focal abnormality. Adrenals/Urinary Tract: Adrenal glands are unremarkable. Kidneys are normal, without renal calculi, focal lesion, or hydronephrosis. Bladder is unremarkable. Stomach/Bowel: In the region of second segment of duodenum/pancreatic head there is a round rim enhancing structure measuring 11 x 10 x 13 mm (AP x ML x CC series 3, image 25 and series 7, image 61) which may have a neck to the wall of duodenum. Stomach is within normal limits. Appendix appears normal. Otherwise no evidence of bowel wall thickening, distention, or inflammatory changes. Vascular/Lymphatic: Aortic atherosclerosis. No enlarged abdominal or pelvic  lymph nodes. Reproductive: Uterus and bilateral adnexa are unremarkable. Other: No abdominal wall hernia or abnormality. No abdominopelvic ascites. Musculoskeletal: No acute or significant osseous findings. IMPRESSION: Small rim enhancing structure medial to second segment of duodenum and peripancreatic edema. Findings may represent a penetrating duodenal ulcer with secondary pancreatitis or acute pancreatitis with small acute peripancreatic collection. These results were called by telephone at the time of interpretation on 11/19/2017 at 3:25 am to Dr. Randal Buba, who verbally acknowledged these results. Electronically Signed   By: Kristine Garbe M.D.   On: 11/19/2017 03:30   Dg Finger Little Right  Result Date: 11/14/2017 CLINICAL DATA:  [place] yesterday, swelling, pain and limited mobility involving RIGHT little finger EXAM: RIGHT LITTLE FINGER 2+V COMPARISON:  RIGHT hand radiographs 06/30/2017 FINDINGS: Diffuse osseous demineralization. Joint spaces preserved. Displaced oblique fracture at base of proximal phalanx RIGHT little finger. Suspicion of extension of a nondisplaced fracture plane into the fifth MCP joint. Questionable small erosion at the PIP joint. No additional acute fracture, dislocation or bone destruction. Healing posttraumatic deformity of the fifth metacarpal with apex ulnar angulation, acute on prior exam. IMPRESSION: Mildly displaced fracture at base of proximal phalanx RIGHT little finger with questionable extension into fifth MCP joint. Healing fracture fifth metacarpal shaft. Osseous demineralization with small erosion at the PIP joint question inflammatory arthropathy. Electronically Signed   By: Lavonia Dana M.D.   On: 11/14/2017 13:34    Time Spent in minutes  25   Epimenio Schetter M.D on 11/21/2017 at 12:53 PM  Between 7am to 7pm - Pager - [phone]  After 7pm go to [email or web address] - password [facility]  [facility] -  Office  [phone]

## 2017-11-22 DIAGNOSIS — K852 Alcohol induced acute pancreatitis without necrosis or infection: Secondary | ICD-10-CM

## 2017-11-22 DIAGNOSIS — K7030 Alcoholic cirrhosis of liver without ascites: Secondary | ICD-10-CM

## 2017-11-22 DIAGNOSIS — K2980 Duodenitis without bleeding: Secondary | ICD-10-CM

## 2017-11-22 HISTORY — DX: Alcohol induced acute pancreatitis without necrosis or infection: K85.20

## 2017-11-22 HISTORY — DX: Duodenitis without bleeding: K29.80

## 2017-11-22 LAB — GLUCOSE, CAPILLARY: GLUCOSE-CAPILLARY: 115 mg/dL — AB (ref 65–99)

## 2017-11-22 MED ORDER — OMEPRAZOLE 20 MG PO CPDR: 40.0000 mg | DELAYED_RELEASE_CAPSULE | Freq: Every day | ORAL | 0 refills | Status: DC

## 2017-11-22 MED ORDER — NICOTINE 21 MG/24HR TD PT24: 21.0000 mg | MEDICATED_PATCH | Freq: Every day | TRANSDERMAL | 0 refills | Status: DC

## 2017-11-22 MED ORDER — HYDROCODONE-ACETAMINOPHEN 5-325 MG PO TABS: 1.0000 | ORAL_TABLET | Freq: Four times a day (QID) | ORAL | 0 refills | Status: DC | PRN

## 2017-11-22 MED ORDER — HYDROCODONE-ACETAMINOPHEN 5-325 MG PO TABS
1.0000 | ORAL_TABLET | Freq: Once | ORAL | Status: AC
  Administered 2017-11-22: 1 via ORAL
  Filled 2017-11-22: qty 1

## 2017-11-22 MED ORDER — MOMETASONE FURO-FORMOTEROL FUM 200-5 MCG/ACT IN AERO: 2.0000 | INHALATION_SPRAY | Freq: Two times a day (BID) | RESPIRATORY_TRACT | 0 refills | Status: DC

## 2017-11-22 NOTE — Progress Notes (Signed)
   11/22/17 1000  Clinical Encounter Type  Visited With Patient  Visit Type Other (Comment) (Giving AD form (Advance Directives) )  Referral From Patient  Consult/Referral To Chaplain  Spiritual Encounters  Spiritual Needs Other (Comment) (Explain AD form )    Responded to [place] AD form and explain it.   Chaplain Intern Hollie Beach

## 2017-11-22 NOTE — Progress Notes (Signed)
Discharge instructions reviewed reviewed with Pt, via Blanch Media, RN Assistance Nurse Manager, Pt was discharged home.   Discharge education was provided to patient this AM. Pain management education was provided to patient this AM

## 2017-11-22 NOTE — Discharge Instructions (Signed)
Acute Pancreatitis Acute pancreatitis is a condition in which the pancreas suddenly gets irritated and swollen (has inflammation). The pancreas is a large gland behind the stomach. It makes enzymes that help to digest food. The pancreas also makes hormones that help to control your blood sugar. Acute pancreatitis happens when the enzymes attack the pancreas and damage it. Most attacks last a couple of days and can cause serious problems. Follow these instructions at home: Eating and drinking  Follow instructions from your doctor about diet. You may need to: ? Avoid alcohol. ? Limit how much fat is in your diet.  Eat small meals often. Avoid eating big meals.  Drink enough fluid to keep your pee (urine) clear or pale yellow.  Do not drink alcohol if it caused your condition. General instructions  Take over-the-counter and prescription medicines only as told by your doctor.  Do not use any tobacco products. These include cigarettes, chewing tobacco, and e-cigarettes. If you need help quitting, ask your doctor.  Get plenty of rest.  If directed, check your blood sugar at home as told by your doctor.  Keep all follow-up visits as told by your doctor. This is important. Contact a doctor if:  You do not get better as quickly as expected.  You have new symptoms.  Your symptoms get worse.  You have lasting pain or weakness.  You continue to feel sick to your stomach (nauseous).  You get better and then you have another pain attack.  You have a fever. Get help right away if:  You cannot eat or keep fluids down.  Your pain becomes very bad.  Your skin or the white part of your eyes turns yellow (jaundice).  You throw up (vomit).  You feel dizzy or you pass out (faint).  Your blood sugar is high (over 300 mg/dL). This information is not intended to replace advice given to you by your health care provider. Make sure you discuss any questions you have with your health care  provider. Document Released: 01/16/2008 Document Revised: 01/05/2016 Document Reviewed: 05/03/2015 Elsevier Interactive Patient Education  2018 [employer]  

## 2017-11-22 NOTE — Discharge Summary (Signed)
Physician Discharge Summary  Felicia Perkins IOE:[id number] DOB: Oct 09, 1975 DOA: 11/18/2017  PCP: Charlott Rakes, MD  Admit date: 11/18/2017 Discharge date: 11/22/2017  Admitted From: Home Disposition: Home  Recommendations for Outpatient Follow-up:  1. Follow up with PCP in 1-2 weeks 2. Follow-up with [facility] for outpatient evaluation for EGD (office will call)  Home Health: None Equipment/Devices: None  Discharge Condition: Fair CODE STATUS: Full code Diet recommendation: Soft diet for next 2 days, advance to regular.    Discharge Diagnoses:  Principal Problem:   Alcohol induced acute pancreatitis   Active Problems:   Alcohol dependence (HCC)   Gastritis and gastroduodenitis   COPD (chronic obstructive pulmonary disease) (HCC)   Alcoholic cirrhosis of liver without ascites (HCC)   Tobacco abuse counseling  Brief narrative/HPI 42 year old female with alcohol dependence with fatty liver, gastritis/duodenitis, COPD, and ongoing tobacco use presented to the ED with epigastric pain for past 2 days.  Patient reports she was drinking more than her usual over the weekend (had several shots of tequila along with about 80 ounce of beer).  She then developed sharp severe pain in her epigastric area radiating to the back associated with nausea and one episode of nonbloody vomiting.  No diarrhea.  Also reported subjective fever.  No hematemesis or melena. Denies similar symptoms in the past.  The ED vitals were stable.  Blood work showed mildly elevated AST (56) and lipase of 91.  CT of the abdomen and pelvis showed small rim-enhancing structure medial to the second segment of duodenum and peripancreatic edema suggestive of either penetrating duodenal ulcer with secondary pancreatitis versus acute pancreatitis with a small fluid collection. General surgery was consulted by ED physician who recommended this is not perforation after reviewing the images.  Patient started on Protonix  infusion and admitted to medical floor.   Hospital course  Principal Problem:   Acute alcoholic pancreatitis  CT reviewed by surgery on call and recommended there was no perforation. Managed conservatively for acute pancreatitis.  Pain control with IV fentanyl, bowel rest, aggressive IV hydration.  Symptoms improved and tolerating advanced diet.  Can be discharged home today if tolerating soft diet.  Abdominal pain much better today and requiring less pain medications.  We will discharge her on soft diet and short course of Vicodin.    Active Problems:   Alcohol dependence (HCC) No withdrawal symptoms.  Counseled strongly on alcohol cessation and patient committed to quitting.      Gastritis and gastroduodenitis Possible on abdominal CT, no perforation per surgery..  Received IV PPI.  Discussed with GI and will arrange outpatient follow-up to evaluate for EGD.  Patient on Prilosec 20 mg daily, increased to 40 mg.  Counseled strongly on smoking cessation and avoiding NSAIDs.   Tobacco abuse Counseled strongly on smoking cessation.    Patient committed to quit.  Nicotine patch prescribed.    COPD (chronic obstructive pulmonary disease) (HCC) Stable.  Reports Dulera that she is getting her is much better than Symbicort so it has been switched.  Plans to quit smoking.  Hypoglycemia Mild.  Received half an amp of D50.  Now on clears.  Alcoholic liver disease with early cirrhosis As seen on prior ultrasound abdomen.  No decompensation noted.  Counseled alcohol cessation.   Family Communication  : None at bedside  Disposition Plan  : Home if tolerating advanced diet   Consults  : None  Procedures  : CT abdomen       Discharge Instructions  Allergies as of 11/22/2017   No Known Allergies     Medication List    STOP taking these medications   alum & mag hydroxide-simeth 200-200-20 MG/5ML suspension Commonly known as:  MAALOX/MYLANTA    budesonide-formoterol 160-4.5 MCG/ACT inhaler Commonly known as:  SYMBICORT Replaced by:  mometasone-formoterol 200-5 MCG/ACT Aero   HYDROcodone-homatropine 5-1.5 MG/5ML syrup Commonly known as:  HYDROMET     TAKE these medications   albuterol 108 (90 Base) MCG/ACT inhaler Commonly known as:  PROVENTIL HFA;VENTOLIN HFA Inhale 1-2 puffs into the lungs every 6 (six) hours as needed for wheezing or shortness of breath.   HYDROcodone-acetaminophen 5-325 MG tablet Commonly known as:  NORCO/VICODIN Take 1 tablet by mouth every 6 (six) hours as needed for moderate pain or severe pain. What changed:  reasons to take this   mometasone-formoterol 200-5 MCG/ACT Aero Commonly known as:  DULERA Inhale 2 puffs into the lungs 2 (two) times daily. Replaces:  budesonide-formoterol 160-4.5 MCG/ACT inhaler   nicotine 21 mg/24hr patch Commonly known as:  NICODERM CQ - dosed in mg/24 hours Place 1 patch (21 mg total) onto the skin daily.   omeprazole 20 MG capsule Commonly known as:  PRILOSEC Take 2 capsules (40 mg total) by mouth daily. What changed:  how much to take      Follow-up Information    Charlott Rakes, MD. Schedule an appointment as soon as possible for a visit in 2 week(s).   Specialty:  Family Medicine Contact information: [place] [address] [phone]        Willia Craze, NP Follow up today.   Specialty:  Gastroenterology Why:  Office will call with appointment Contact information: [place] [address] [phone]          No Known Allergies    Procedures/Studies: Ct Abdomen Pelvis W Contrast  Result Date: 11/19/2017 CLINICAL DATA:  42 y/o F; left upper quadrant abdominal pain and nausea. EXAM: CT ABDOMEN AND PELVIS WITH CONTRAST TECHNIQUE: Multidetector CT imaging of the abdomen and pelvis was performed using the standard protocol following bolus administration of intravenous contrast. CONTRAST:  16mL  ISOVUE-300 IOPAMIDOL (ISOVUE-300) INJECTION 61% COMPARISON:  None. FINDINGS: Lower chest: No acute abnormality. Hepatobiliary: No focal liver abnormality is seen. No gallstones, gallbladder wall thickening, or biliary dilatation. Pancreas: There is mild edema surrounding head of pancreas and tail of pancreas. No pancreatic ductal dilatation. Spleen: Normal in size without focal abnormality. Adrenals/Urinary Tract: Adrenal glands are unremarkable. Kidneys are normal, without renal calculi, focal lesion, or hydronephrosis. Bladder is unremarkable. Stomach/Bowel: In the region of second segment of duodenum/pancreatic head there is a round rim enhancing structure measuring 11 x 10 x 13 mm (AP x ML x CC series 3, image 25 and series 7, image 61) which may have a neck to the wall of duodenum. Stomach is within normal limits. Appendix appears normal. Otherwise no evidence of bowel wall thickening, distention, or inflammatory changes. Vascular/Lymphatic: Aortic atherosclerosis. No enlarged abdominal or pelvic lymph nodes. Reproductive: Uterus and bilateral adnexa are unremarkable. Other: No abdominal wall hernia or abnormality. No abdominopelvic ascites. Musculoskeletal: No acute or significant osseous findings. IMPRESSION: Small rim enhancing structure medial to second segment of duodenum and peripancreatic edema. Findings may represent a penetrating duodenal ulcer with secondary pancreatitis or acute pancreatitis with small acute peripancreatic collection. These results were called by telephone at the time of interpretation on 11/19/2017 at 3:25 am to Dr. Randal Buba, who verbally acknowledged these results. Electronically  Signed   By: Kristine Garbe M.D.   On: 11/19/2017 03:30   Dg Finger Little Right  Result Date: 11/14/2017 CLINICAL DATA:  [place] yesterday, swelling, pain and limited mobility involving RIGHT little finger EXAM: RIGHT LITTLE FINGER 2+V COMPARISON:  RIGHT hand radiographs 06/30/2017 FINDINGS:  Diffuse osseous demineralization. Joint spaces preserved. Displaced oblique fracture at base of proximal phalanx RIGHT little finger. Suspicion of extension of a nondisplaced fracture plane into the fifth MCP joint. Questionable small erosion at the PIP joint. No additional acute fracture, dislocation or bone destruction. Healing posttraumatic deformity of the fifth metacarpal with apex ulnar angulation, acute on prior exam. IMPRESSION: Mildly displaced fracture at base of proximal phalanx RIGHT little finger with questionable extension into fifth MCP joint. Healing fracture fifth metacarpal shaft. Osseous demineralization with small erosion at the PIP joint question inflammatory arthropathy. Electronically Signed   By: Lavonia Dana M.D.   On: 11/14/2017 13:34        Subjective: Reports abdominal pain to be better and requiring pain medications less frequently.  Tolerating full liquid diet.  Discharge Exam: Vitals:   11/22/17 0529 11/22/17 0943  BP: 116/82   Pulse: 67 80  Resp: 16 16  Temp: 98.6 F (37 C)   SpO2: 100% 99%   Vitals:   11/21/17 2121 11/21/17 2201 11/22/17 0529 11/22/17 0943  BP: 127/90  116/82   Pulse: 65 67 67 80  Resp: 16 16 16 16   Temp: 98.3 F (36.8 C)  98.6 F (37 C)   TempSrc: Oral  Oral   SpO2: 100% 100% 100% 99%  Weight:      Height:        General: Middle-aged female not in distress HEENT: Moist mucosa, supple neck Chest: Clear bilaterally CVS: Normal S1 and S2 GI: Soft, nondistended, bowel sounds present, no abdominal tenderness Musculoskeletal: Warm, no edema     The results of significant diagnostics from this hospitalization (including imaging, microbiology, ancillary and laboratory) are listed below for reference.     Microbiology: No results found for this or any previous visit (from the past 240 hour(s)).   Labs: BNP (last 3 results) No results for input(s): BNP in the last 8760 hours. Basic Metabolic Panel: Recent Labs  Lab  11/18/17 1241 11/19/17 0420 11/20/17 0452  NA 136 133* 137  K 4.3 3.8 4.0  CL 99* 97* 104  CO2 23 24 17*  GLUCOSE 104* 86 49*  BUN <5* <5* <5*  CREATININE 0.62 0.58 0.66  CALCIUM 9.4 9.1 8.6*   Liver Function Tests: Recent Labs  Lab 11/18/17 1241 11/19/17 0420 11/20/17 0452  AST 56* 40 31  ALT 37 28 22  ALKPHOS 146* 119 97  BILITOT 0.6 1.4* 1.6*  PROT 7.2 6.1* 5.5*  ALBUMIN 3.5 3.0* 2.6*   Recent Labs  Lab 11/18/17 1241 11/19/17 0420  LIPASE 91* 102*   No results for input(s): AMMONIA in the last 168 hours. CBC: Recent Labs  Lab 11/18/17 1241 11/19/17 0420  WBC 9.8 7.3  NEUTROABS  --  3.6  HGB 15.2* 13.3  HCT 44.8 39.0  MCV 97.2 97.7  PLT 341 282   Cardiac Enzymes: No results for input(s): CKTOTAL, CKMB, CKMBINDEX, TROPONINI in the last 168 hours. BNP: Invalid input(s): POCBNP CBG: Recent Labs  Lab 11/19/17 0739 11/20/17 0746 11/20/17 0820 11/22/17 0757  GLUCAP 77 63* 129* 115*   D-Dimer No results for input(s): DDIMER in the last 72 hours. Hgb A1c No results for input(s): HGBA1C in  the last 72 hours. Lipid Profile No results for input(s): CHOL, HDL, LDLCALC, TRIG, CHOLHDL, LDLDIRECT in the last 72 hours. Thyroid function studies No results for input(s): TSH, T4TOTAL, T3FREE, THYROIDAB in the last 72 hours.  Invalid input(s): FREET3 Anemia work up No results for input(s): VITAMINB12, FOLATE, FERRITIN, TIBC, IRON, RETICCTPCT in the last 72 hours. Urinalysis    Component Value Date/Time   COLORURINE YELLOW 02/22/2016 1406   APPEARANCEUR CLOUDY (A) 02/22/2016 1406   LABSPEC 1.015 09/14/2016 1531   PHURINE 8.5 (H) 09/14/2016 1531   GLUCOSEU NEGATIVE 09/14/2016 1531   HGBUR NEGATIVE 09/14/2016 1531   BILIRUBINUR NEGATIVE 09/14/2016 1531   KETONESUR NEGATIVE 09/14/2016 1531   PROTEINUR NEGATIVE 09/14/2016 1531   UROBILINOGEN 1.0 09/14/2016 1531   NITRITE NEGATIVE 09/14/2016 1531   LEUKOCYTESUR LARGE (A) 09/14/2016 1531   Sepsis  Labs Invalid input(s): PROCALCITONIN,  WBC,  LACTICIDVEN Microbiology No results found for this or any previous visit (from the past 240 hour(s)).   Time coordinating discharge: 35 minutes  SIGNED:   Louellen Molder, MD  [facility] 11/22/2017, 9:49 AM Pager   If 7PM-7AM, please contact night-coverage [email or web address] Password [email or web address]

## 2017-11-22 NOTE — Progress Notes (Signed)
Discharge instructions reviewed with patient. Patient able to answer teach-back questions re: f/u visit, pain management, and post discharge care. Printed AVS and prescriptions given to patient. Patient discharged to home accompanied by daughter.No wheelchair ride per patient's request.

## 2017-11-25 ENCOUNTER — Encounter: Payer: Self-pay | Admitting: Nurse Practitioner

## 2017-12-01 ENCOUNTER — Encounter (HOSPITAL_COMMUNITY): Payer: Self-pay | Admitting: Emergency Medicine

## 2017-12-01 ENCOUNTER — Emergency Department (HOSPITAL_COMMUNITY)
Admission: EM | Admit: 2017-12-01 | Discharge: 2017-12-01 | Disposition: A | Discharge status: Home / Self Care | Payer: Medicaid Other | Attending: Physician Assistant | Admitting: Physician Assistant

## 2017-12-01 ENCOUNTER — Other Ambulatory Visit: Payer: Self-pay

## 2017-12-01 DIAGNOSIS — Z87891 Personal history of nicotine dependence: Secondary | ICD-10-CM | POA: Diagnosis not present

## 2017-12-01 DIAGNOSIS — J449 Chronic obstructive pulmonary disease, unspecified: Secondary | ICD-10-CM | POA: Diagnosis not present

## 2017-12-01 DIAGNOSIS — Z79899 Other long term (current) drug therapy: Secondary | ICD-10-CM | POA: Diagnosis not present

## 2017-12-01 DIAGNOSIS — R110 Nausea: Secondary | ICD-10-CM | POA: Diagnosis not present

## 2017-12-01 DIAGNOSIS — R109 Unspecified abdominal pain: Secondary | ICD-10-CM | POA: Diagnosis present

## 2017-12-01 DIAGNOSIS — Z8719 Personal history of other diseases of the digestive system: Secondary | ICD-10-CM

## 2017-12-01 LAB — CBC
HEMATOCRIT: 37.8 % (ref 36.0–46.0)
HEMOGLOBIN: 12.8 g/dL (ref 12.0–15.0)
MCH: 33.8 pg (ref 26.0–34.0)
MCHC: 33.9 g/dL (ref 30.0–36.0)
MCV: 99.7 fL (ref 78.0–100.0)
Platelets: 336 10*3/uL (ref 150–400)
RBC: 3.79 MIL/uL — AB (ref 3.87–5.11)
RDW: 13.2 % (ref 11.5–15.5)
WBC: 6.8 10*3/uL (ref 4.0–10.5)

## 2017-12-01 LAB — I-STAT BETA HCG BLOOD, ED (MC, WL, AP ONLY): I-stat hCG, quantitative: <5 m[IU]/mL (ref <5)

## 2017-12-01 LAB — COMPREHENSIVE METABOLIC PANEL
ALBUMIN: 3.1 g/dL — AB (ref 3.5–5.0)
ALK PHOS: 163 U/L — AB (ref 38–126)
ALT: 36 U/L (ref 14–54)
ANION GAP: 13 (ref 5–15)
AST: 38 U/L (ref 15–41)
BUN: <5 mg/dL — ABNORMAL LOW (ref 6–20)
CALCIUM: 9.5 mg/dL (ref 8.9–10.3)
CHLORIDE: 104 mmol/L (ref 101–111)
CO2: 21 mmol/L — AB (ref 22–32)
Creatinine, Ser: 0.4 mg/dL — ABNORMAL LOW (ref 0.44–1.00)
GFR calc Af Amer: >60 mL/min (ref >60)
GFR calc non Af Amer: >60 mL/min (ref >60)
GLUCOSE: 99 mg/dL (ref 65–99)
POTASSIUM: 4.1 mmol/L (ref 3.5–5.1)
SODIUM: 138 mmol/L (ref 135–145)
Total Bilirubin: 0.5 mg/dL (ref 0.3–1.2)
Total Protein: 7.3 g/dL (ref 6.5–8.1)

## 2017-12-01 LAB — LIPASE, BLOOD: LIPASE: 46 U/L (ref 11–51)

## 2017-12-01 MED ORDER — ONDANSETRON HCL 4 MG/2ML IJ SOLN
4.0000 mg | Freq: Once | INTRAMUSCULAR | Status: AC
  Administered 2017-12-01: 4 mg via INTRAVENOUS
  Filled 2017-12-01: qty 2

## 2017-12-01 MED ORDER — SODIUM CHLORIDE 0.9 % IV BOLUS
1000.0000 mL | Freq: Once | INTRAVENOUS | Status: AC
  Administered 2017-12-01: 1000 mL via INTRAVENOUS

## 2017-12-01 MED ORDER — DICYCLOMINE HCL 20 MG PO TABS: 20.0000 mg | ORAL_TABLET | Freq: Two times a day (BID) | ORAL | 0 refills | Status: DC

## 2017-12-01 MED ORDER — ONDANSETRON HCL 4 MG PO TABS: 4.0000 mg | ORAL_TABLET | Freq: Three times a day (TID) | ORAL | 0 refills | Status: DC | PRN

## 2017-12-01 MED ORDER — FENTANYL CITRATE (PF) 100 MCG/2ML IJ SOLN
50.0000 ug | Freq: Once | INTRAMUSCULAR | Status: AC
  Administered 2017-12-01: 50 ug via INTRAVENOUS
  Filled 2017-12-01: qty 2

## 2017-12-01 NOTE — ED Triage Notes (Signed)
Patient presents to ED for assessment of continuing abdominal pain after being d/c'd with pancreatitis last week.  Patient states once she ran out of her pain medications she cannot control the pain or get any sleep.  Pt c/o nausea, and would like a refill on that prescription as well

## 2017-12-01 NOTE — Discharge Instructions (Signed)
Your lab work was reassuring. Please keep appointment with GI and follow up with PCP this week. We will not be able to refill narcotic pain medication here in the department. Please take zofran as needed for nausea. Please continue advancing your diet and staying well hydrated. Please continue to avoid alcohol. Take tylenol or bentyl as needed for pain.   Contact a doctor if: You do not get better as quickly as expected. You have new symptoms. Your symptoms get worse. You have lasting pain or weakness. You continue to feel sick to your stomach (nauseous). You get better and then you have another pain attack. You have a fever. Get help right away if: You cannot eat or keep fluids down. Your pain becomes very bad. Your skin or the white part of your eyes turns yellow (jaundice). You throw up (vomit). You feel dizzy or you pass out (faint). Your blood sugar is high (over 300 mg/dL).

## 2017-12-01 NOTE — ED Provider Notes (Addendum)
[facility] EMERGENCY DEPARTMENT Provider Note   CSN: [id number] Arrival date & time: 12/01/17  1224     History   Chief Complaint Chief Complaint  Patient presents with  . Abdominal Pain    HPI  Felicia Perkins is a 42 y.o. female history of alcohol abuse, pancreatitis (alcohol induced), gastritis who presents emergency department today for abdominal pain.  Patient was recently admitted on 11/18/2017 for alcohol induced pancreatitis.  This was deemed to be related to the patient's alcohol abuse.  She had a CT scan done that showed small ring enhancing structure medial to the second segment of the duodenum and peripancreatic edema.  Surgery was consulted that recommended this perforation and was likely secondary to acute pancreatitis with small fluid collection.  Patient seen in the hospital for several days and was treated with pain medication, bowel rest and IV hydration.  Her symptoms improved and her diet was advanced.  She was discharged home on a soft diet and a short course of Vicodin.  Patient is also started on omeprazole 20 mg twice daily she has been taking as prescribed.  Patient is presenting today because she notes she has continued epigastric abdominal pain that usually occurs at night when she lies down to sleep.  She notes that she has run out of her Vicodin medication and is requesting refill.  She is currently controlling her symptoms at home with 1000 mg of Tylenol as needed.  She notes that yesterday she had some nausea without emesis.  She is currently pain-free today however notes that she is afraid her pain may return later tonight and is requesting refill of her Vicodin.  She notes that she has advance her diet and is tolerating chicken, mashed potatoes and soft food diet without pain or difficulty.  She reports normal bowel movements with last this morning.  The patient denies any associated fever, chills, chest pain, shortness breath, cough, right upper  quadrant abdominal pain, lower abdominal pain, urinary frequency, urinary urgency, hematuria, flank pain, dysuria, vaginal discharge, vaginal bleeding, melena, hematochezia or diarrhea.  Patient denies any alcohol use or NSAID use since discharge.  No antipyretics prior to arrival.  Patient has had previous abdominal hysterectomy. Denies any other abdominal surgeries.  Patient reports she is scheduled to have follow-up with the [facility], Tye Savoy next week.   HPI  Past Medical History:  Diagnosis Date  . Alcohol abuse   . Anxiety   . Bipolar 1 disorder ([place])   . PTSD (post-traumatic stress disorder)     Patient Active Problem List   Diagnosis Date Noted  . Alcohol induced acute pancreatitis 11/22/2017  . Duodenitis 11/22/2017  . Tobacco abuse counseling   . 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 Nondisplaced fracture of neck of fifth metacarpal bone, right hand, initial encounter for closed fracture 08/26/2017  . Alcoholic cirrhosis of liver without ascites ([place]) 10/15/2016  . Alcohol abuse 09/04/2016  . Gastritis and gastroduodenitis 09/04/2016  . Tobacco abuse 09/04/2016  . COPD (chronic obstructive pulmonary disease) ([place]) 09/04/2016  . Elevated liver enzymes 12/22/2014  . Abdominal pain, epigastric 12/16/2014  . Chest pain with moderate risk for cardiac etiology 12/15/2014  . Alcohol dependence ([place]) 12/16/2012    Class: Acute    Past Surgical History:  Procedure Laterality Date  . ABDOMINAL HYSTERECTOMY    . FOOT SURGERY    . OTHER SURGICAL HISTORY     ovarian surgery  . TUBAL LIGATION  OB History   None      Home Medications    Prior to Admission medications   Medication Sig Start Date End Date Taking? Authorizing Provider  acetaminophen (TYLENOL) 500 MG tablet Take 1,000 mg by mouth every 6 (six) hours as needed for moderate pain.   Yes [provider]  albuterol (PROVENTIL HFA;VENTOLIN HFA) 108 (90 Base) MCG/ACT inhaler Inhale 1-2 puffs  into the lungs every 6 (six) hours as needed for wheezing or shortness of breath. 11/11/17  Yes Robyn Haber, MD  HYDROcodone-acetaminophen (NORCO/VICODIN) 5-325 MG tablet Take 1 tablet by mouth every 6 (six) hours as needed for moderate pain or severe pain. 11/22/17  Yes Dhungel, Nishant, MD  mometasone-formoterol (DULERA) 200-5 MCG/ACT AERO Inhale 2 puffs into the lungs 2 (two) times daily. 11/22/17  Yes Dhungel, Nishant, MD  nicotine (NICODERM CQ - DOSED IN MG/24 HOURS) 21 mg/24hr patch Place 1 patch (21 mg total) onto the skin daily. 11/22/17  Yes Dhungel, Nishant, MD  omeprazole (PRILOSEC) 20 MG capsule Take 2 capsules (40 mg total) by mouth daily. 11/22/17  Yes Dhungel, Flonnie Overman, MD    Family History Family History  Problem Relation Age of Onset  . Hypertension Mother   . Cancer Mother        breast cancer  . Hypertension Father   . Heart attack Cousin 30       died in 103s of MI    Social History Social History   Tobacco Use  . Smoking status: Former Smoker    Packs/day: 0.50    Types: Cigarettes  . Smokeless tobacco: Never Used  . Tobacco comment: 6-7 cigs daily  Substance Use Topics  . Alcohol use: Not Currently    Comment: Weekends.   . Drug use: No     Allergies   Patient has no known allergies.   Review of Systems Review of Systems  All other systems reviewed and are negative.    Physical Exam Updated Vital Signs BP 119/85   Pulse 65   Temp 99 F (37.2 C) (Oral)   Resp 20   Ht 5' 4"  (1.626 m)   Wt 50.3 kg (111 lb)   SpO2 100%   BMI 19.05 kg/m   Physical Exam  Constitutional: She appears well-developed and well-nourished.  HENT:  Head: Normocephalic and atraumatic.  Right Ear: External ear normal.  Left Ear: External ear normal.  Nose: Nose normal.  Mouth/Throat: Uvula is midline, oropharynx is clear and moist and mucous membranes are normal. No tonsillar exudate.  Eyes: Pupils are equal, round, and reactive to light. Right eye exhibits no  discharge. Left eye exhibits no discharge. No scleral icterus.  Neck: Trachea normal. Neck supple. No spinous process tenderness present. No neck rigidity. Normal range of motion present.  Cardiovascular: Normal rate, regular rhythm and intact distal pulses.  No murmur heard. Pulses:      Radial pulses are 2+ on the right side, and 2+ on the left side.       Dorsalis pedis pulses are 2+ on the right side, and 2+ on the left side.       Posterior tibial pulses are 2+ on the right side, and 2+ on the left side.  No lower extremity swelling or edema. Calves symmetric in size bilaterally.  Pulmonary/Chest: Effort normal and breath sounds normal. She exhibits no tenderness.  Abdominal: Soft. Bowel sounds are normal. She exhibits no distension and no ascites. There is no tenderness. There is no rigidity, no  rebound, no guarding, no CVA tenderness, no tenderness at McBurney's point and negative Murphy's sign. No hernia.  No abdominal distention.  No ascites.  No tenderness to palpation.  No rigidity, rebound, guarding.  Negative Murphy sign. No hernia.  No flank/CVA tenderness.  No acute abdomen.   Musculoskeletal: She exhibits no edema.  Lymphadenopathy:    She has no cervical adenopathy.  Neurological: She is alert.  Skin: Skin is warm and dry. No rash noted. She is not diaphoretic.  Psychiatric: She has a normal mood and affect.  Nursing note and vitals reviewed.    ED Treatments / Results  Labs (all labs ordered are listed, but only abnormal results are displayed) Labs Reviewed  COMPREHENSIVE METABOLIC PANEL - Abnormal; Notable for the following components:      Result Value   CO2 21 (*)    BUN <5 (*)    Creatinine, Ser 0.40 (*)    Albumin 3.1 (*)    Alkaline Phosphatase 163 (*)    All other components within normal limits  CBC - Abnormal; Notable for the following components:   RBC 3.79 (*)    All other components within normal limits  LIPASE, BLOOD  I-STAT BETA HCG BLOOD, ED ([facility],  [facility], [facility] ONLY)    EKG None  Radiology No results found.  Procedures Procedures (including critical care time)  Medications Ordered in ED Medications  sodium chloride 0.9 % bolus 1,000 mL (has no administration in time range)  ondansetron (ZOFRAN) injection 4 mg (has no administration in time range)  fentaNYL (SUBLIMAZE) injection 50 mcg (has no administration in time range)     Initial Impression / Assessment and Plan / ED Course  I have reviewed the triage vital signs and the nursing notes.  Pertinent labs & imaging results that were available during my care of the patient were reviewed by me and considered in my medical decision making (see chart for details).     42 y.o. female who was recently diagnosed with alcohol induced pancreatitis on 11/18/2017 and admitted to the hospitalist service presenting for continued abdominal pain.  During stay patient had a CT scan done that showed small ring enhancing structure medial to the second segment of the duodenum and peripancreatic edema.  Surgery was consulted that recommended this perforation and was likely secondary to acute pancreatitis with small fluid collection. Patient was started on PPO and has been taking as prescribed. Patient seen in the hospital for several days and was treated with pain medication, bowel rest and IV hydration.  Her symptoms improved and her diet was advanced.  She was discharged home on a soft diet and a short course of Vicodin.  Patient is also started on omeprazole 20 mg twice daily she has been taking as prescribed.  Patient is presenting today because she notes she has continued epigastric abdominal pain that usually occurs at night when she lies down to sleep.  She notes some associated nausea without emesis.  She is currently symptomatic free.  She denies any alcohol use since discharge.  She is tolerating p.o. diet and fluid advancing without difficulty.  No fevers at home.  Denies any urinary symptoms or GU  symptoms.  No colicky abdominal pain after food.  She has follow-up with GI next week.  On arrival patient's vital signs are reassuring.  No fever, tachycardia, tachypnea, hypoxia or hypotension.  Patient is non-ill and nonseptic appearing.  On exam the patient is without abdominal tenderness palpation.  No Murphy sign.  No peritoneal signs.  Does not appear to be dehydrated on exam.  Patient's lab work is reassuring.  No leukocytosis.  No anemia.  No significant electrolyte derangements.  No evidence of DKA.  No acute kidney injury.  Alk phos elevated but this appears to be chronic.  AST, ALT and bilirubin within normal limits.  Given no pain after eating, reassuring LFTs and no right upper quadrant tenderness on palpation or Murphy's sign, do not feel she needs a right upper quadrant ultrasound to evaluate for cholecystitis.  Lipase is within normal limits.  No evidence of recurrent pancreatitis.  Pregnancy test is negative.  Patient given IV fluid bolus and nausea medication.  She is tolerating p.o. fluids.  Given the patient's reassuring labs, vital signs, nontender abdominal exam, and patient is tolerating p.o. fluids do not feel patient requires further workup at this time. The evaluation does not show pathology that would require ongoing emergent intervention or inpatient treatment.  Patient does not meet SIRS or sepsis criteria.  She does not have a surgical abdomen.  No concern for appendicitis, bowel obstruction, bowel perforation, diverticulitis or ectopic pregnancy. Will discharge home with symptomatic therapy for nausea. Discussed with patient to use OTC medication for pain like tylenol. Given patient history of gastritis, will avoid NSAIDs. Discussed with patient she will need to follow up with PCP or GI for any refills on Narcotics. Recommended patient follow-up with her PCP and keep appointment GI.  I advised the patient to return to the emergency department with new or worsening symptoms or new  concerns. Specific return precautions discussed. The patient verbalized understanding and agreement with plan. All questions answered. No further questions at this time. The patient is hemodynamically stable, mentating appropriately and appears safe for discharge.  Final Clinical Impressions(s) / ED Diagnoses   Final diagnoses:  Nausea  History of pancreatitis    ED Discharge Orders        Ordered    ondansetron (ZOFRAN) 4 MG tablet  Every 8 hours PRN     12/01/17 1538    dicyclomine (BENTYL) 20 MG tablet  2 times daily     12/01/17 1538       Blaize Epple, Mariann Barter 12/01/17 1540    Jillyn Ledger, PA-C 12/01/17 1542    Mackuen, Fredia Sorrow, MD 12/04/17 [phone]

## 2017-12-02 ENCOUNTER — Telehealth: Payer: Self-pay

## 2017-12-02 ENCOUNTER — Encounter: Payer: Self-pay | Admitting: Nurse Practitioner

## 2017-12-02 NOTE — Telephone Encounter (Signed)
Left a message requesting a call back to schedule her a new patient appointment with Tye Savoy, NP

## 2017-12-02 NOTE — Telephone Encounter (Signed)
-----   Message from Willia Craze, NP sent at 12/01/2017 12:34 PM EDT ----- Felicia Perkins, please get this patient an appt with me. She has duodenal findings on imaging and was just in hospital. We didn't see her inpatient .

## 2017-12-05 ENCOUNTER — Ambulatory Visit: Payer: Self-pay

## 2017-12-11 ENCOUNTER — Ambulatory Visit: Payer: Medicaid Other | Admitting: Nurse Practitioner

## 2017-12-11 ENCOUNTER — Encounter: Payer: Self-pay | Admitting: Nurse Practitioner

## 2017-12-11 VITALS — BP 86/62 | HR 88 | Ht 64.5 in | Wt 110.5 lb

## 2017-12-11 DIAGNOSIS — K5900 Constipation, unspecified: Secondary | ICD-10-CM | POA: Diagnosis not present

## 2017-12-11 DIAGNOSIS — K859 Acute pancreatitis without necrosis or infection, unspecified: Secondary | ICD-10-CM | POA: Diagnosis not present

## 2017-12-11 MED ORDER — HYDROCORTISONE 2.5 % RE CREA: 1.0000 "application " | TOPICAL_CREAM | Freq: Every day | RECTAL | 1 refills | Status: DC

## 2017-12-11 NOTE — Patient Instructions (Signed)
If you are age 43 or older, your body mass index should be between 23-30. Your Body mass index is 18.67 kg/m. If this is out of the aforementioned range listed, please consider follow up with your Primary Care Provider.  If you are age 39 or younger, your body mass index should be between 19-25. Your Body mass index is 18.67 kg/m. If this is out of the aformentioned range listed, please consider follow up with your Primary Care Provider.   You have been scheduled for an MRI at [facility]. [address]on 12/17/17. Your appointment time is 3 pm. Please arrive 30 minutes prior to your appointment time for registration purposes. Please make certain not to have anything to eat or drink 4 hours prior to your test. In addition, if you have any metal in your body, have a pacemaker or defibrillator, please be sure to let your ordering physician know. This test typically takes 45 minutes to 1 hour to complete. Should you need to reschedule, please call [phone] to do so.  You have been scheduled for an endoscopy. Please follow written instructions given to you at your visit today. If you use inhalers (even only as needed), please bring them with you on the day of your procedure. Your physician has requested that you go to www.startemmi.com and enter the access code given to you at your visit today. This web site gives a general overview about your procedure. However, you should still follow specific instructions given to you by our office regarding your preparation for the procedure.  We have sent the following medications to your pharmacy for you to pick up at your convenience: Anusol HC cream   Continue Omeprazole twice daily. DISCONTINUE Bentyl. Start Citrucel daily. Increase water to 8- 8 ounce glasses daily.  Thank you for choosing me and [facility].   Tye Savoy, NP

## 2017-12-11 NOTE — Progress Notes (Addendum)
.     Chief Complaint: recent pancreatitis  Referring Provider:   Louellen Molder, MD     ASSESSMENT AND PLAN;   39. 42 yo female recently admitted with Etoh pancreatitis. Mild peripancreatitc edema around head and tail of pancreas but also a small enhancing rim structure medial to D2 was seen on CT scan raising concern for penetrating duodenal ulcer. She continues to have sharp, frequent episodes of upper abdominal pain with nausea throughout the day. Pain independent of PO intake.  -Penetrating duodenal ulcer seems less likely. Will arrange for MRI with pancreatic protocol. If unrevealing then will proceed with EGD to evaluate for duodenal ulcer. The risks and benefits of EGD were discussed and the patient agrees to proceed.  -she hasn't had any ETOH in a month and understands that abstinence is crucial. -can discontinue bentyl prescribed in ED -continue daily PPI for now  #2 Constipation.  -start daily Citrucel  Addendum: Reviewed and agree with initial management. Pyrtle, Lajuan Lines, MD    HPI:     Patient is a 42 year old female with several year hx of alcoholism. She also has asthma, anxiety, GERD, and COPD. She was admitted last month with Etoh pancreatitis. Transaminase elevation c/w Etoh. Ultrasound negative for cholelithiasis. CT scan remarkable for mild edema around head and tail of pancreas. Concern raised for a penetrating duodenal ulcer. Surgery was called by admitting Hospitalist. Scan reviewed on phone and findings not felt to represent penetrating ulcer. She hasn't had any hematemesis or melena. Some NSAID use but not heavy use. Hospitalist arranged for outpatient evaluation as patient improved clinically and was able to be discharged from hospital. .   Darrick Grinder continues to have intermittent, "harsh"upper abdominal pain. Food doesn't really make the pain any worse. Feels like same pain when admitted to hospital with pancreaitis except that it doesn't radiate through to  her back. Pain has never resolved since leaving the hospital but she ran out of pain meds and pain is worse. She has frequent nausea relieved with anti-emetics. No ETOH since April 6th. She went back to ED on 4/21, lipase 46, liver enzymes normal, but alk phos up to 163 from being normal around time of discharge on 11/20/17. ED prescribed Bentyl for pain. Her weight has remained stable. Her only other GI complaint is intermittent constipation with infrequent bowel movements.   Patient's uncle passed with pancreatic cancer 3 months ago.   CT scan IMPRESSION: Small rim enhancing structure medial to second segment of duodenum and peripancreatic edema. Findings may represent a penetrating duodenal ulcer with secondary pancreatitis or acute pancreatitis with small acute peripancreatic collection    Past Medical History:  Diagnosis Date  . Alcohol abuse   . Anxiety   . Bipolar 1 disorder ([place])   . GERD (gastroesophageal reflux disease)   . Pancreatitis   . PTSD (post-traumatic stress disorder)     Past Surgical History:  Procedure Laterality Date  . ABDOMINAL HYSTERECTOMY    . FOOT SURGERY    . OTHER SURGICAL HISTORY     ovarian surgery  . TUBAL LIGATION     Family History  Problem Relation Age of Onset  . Hypertension Mother   . Breast cancer Mother   . Hypertension Father   . Heart attack Cousin 30       died in 32s of MI  . Colon cancer Neg Hx    Social History   Tobacco Use  . Smoking status: Current Every Day Smoker  Packs/day: 0.50    Types: Cigarettes  . Smokeless tobacco: Never Used  . Tobacco comment: 2-3 cigarettes daily  Substance Use Topics  . Alcohol use: Not Currently    Comment: no alchol for the past 3 weeks  . Drug use: No   Current Outpatient Medications  Medication Sig Dispense Refill  . albuterol (PROVENTIL HFA;VENTOLIN HFA) 108 (90 Base) MCG/ACT inhaler Inhale 1-2 puffs into the lungs every 6 (six) hours as needed for wheezing or shortness of  breath. 1 Inhaler 3  . dicyclomine (BENTYL) 20 MG tablet Take 1 tablet (20 mg total) by mouth 2 (two) times daily. 20 tablet 0  . mometasone-formoterol (DULERA) 200-5 MCG/ACT AERO Inhale 2 puffs into the lungs 2 (two) times daily. 5 Inhaler 0  . nicotine (NICODERM CQ - DOSED IN MG/24 HOURS) 21 mg/24hr patch Place 1 patch (21 mg total) onto the skin daily. 28 patch 0  . omeprazole (PRILOSEC) 20 MG capsule Take 2 capsules (40 mg total) by mouth daily. 60 capsule 0  . ondansetron (ZOFRAN) 4 MG tablet Take 1 tablet (4 mg total) by mouth every 8 (eight) hours as needed for nausea or vomiting. 4 tablet 0   No current facility-administered medications for this visit.    No Known Allergies   Review of Systems: Positive for headaches, sleeping problems and sore throat. All other systems reviewed and negative except where noted in HPI.   Physical Exam:    Wt Readings from Last 3 Encounters:  12/11/17 110 lb 8 oz (50.1 kg)  12/01/17 111 lb (50.3 kg)  11/18/17 111 lb (50.3 kg)    BP (!) 86/62   Pulse 88   Ht 5' 4.5" (1.638 m)   Wt 110 lb 8 oz (50.1 kg)   BMI 18.67 kg/m  Constitutional:  Thin black female in no acute distress. Psychiatric: Normal mood and affect. Behavior is normal. EENT: Pupils normal.  Conjunctivae are normal. No scleral icterus. Neck supple.  Cardiovascular: Normal rate, regular rhythm. No edema Pulmonary/chest: Effort normal and breath sounds normal. No wheezing, rales or rhonchi. Abdominal: Soft, nondistended. Mild diffuse upper abdominal tenderness. Bowel sounds active throughout. There are no masses palpable. No hepatomegaly. Neurological: Alert and oriented to person place and time. Skin: Skin is warm and dry. No rashes noted.  Tye Savoy, NP  12/11/2017, 2:11 PM  Cc: Louellen Molder, MD

## 2017-12-11 NOTE — H&P (View-Only) (Signed)
.     Chief Complaint: recent pancreatitis  Referring Provider:   Louellen Molder, MD     ASSESSMENT AND PLAN;   86. 42 yo female recently admitted with Etoh pancreatitis. Mild peripancreatitc edema around head and tail of pancreas but also a small enhancing rim structure medial to D2 was seen on CT scan raising concern for penetrating duodenal ulcer. She continues to have sharp, frequent episodes of upper abdominal pain with nausea throughout the day. Pain independent of PO intake.  -Penetrating duodenal ulcer seems less likely. Will arrange for MRI with pancreatic protocol. If unrevealing then will proceed with EGD to evaluate for duodenal ulcer. The risks and benefits of EGD were discussed and the patient agrees to proceed.  -she hasn't had any ETOH in a month and understands that abstinence is crucial. -can discontinue bentyl prescribed in ED -continue daily PPI for now  #2 Constipation.  -start daily Citrucel  Addendum: Reviewed and agree with initial management. Pyrtle, Lajuan Lines, MD    HPI:     Patient is a 42 year old female with several year hx of alcoholism. She also has asthma, anxiety, GERD, and COPD. She was admitted last month with Etoh pancreatitis. Transaminase elevation c/w Etoh. Ultrasound negative for cholelithiasis. CT scan remarkable for mild edema around head and tail of pancreas. Concern raised for a penetrating duodenal ulcer. Surgery was called by admitting Hospitalist. Scan reviewed on phone and findings not felt to represent penetrating ulcer. She hasn't had any hematemesis or melena. Some NSAID use but not heavy use. Hospitalist arranged for outpatient evaluation as patient improved clinically and was able to be discharged from hospital. .   Darrick Grinder continues to have intermittent, "harsh"upper abdominal pain. Food doesn't really make the pain any worse. Feels like same pain when admitted to hospital with pancreaitis except that it doesn't radiate through to  her back. Pain has never resolved since leaving the hospital but she ran out of pain meds and pain is worse. She has frequent nausea relieved with anti-emetics. No ETOH since April 6th. She went back to ED on 4/21, lipase 46, liver enzymes normal, but alk phos up to 163 from being normal around time of discharge on 11/20/17. ED prescribed Bentyl for pain. Her weight has remained stable. Her only other GI complaint is intermittent constipation with infrequent bowel movements.   Patient's uncle passed with pancreatic cancer 3 months ago.   CT scan IMPRESSION: Small rim enhancing structure medial to second segment of duodenum and peripancreatic edema. Findings may represent a penetrating duodenal ulcer with secondary pancreatitis or acute pancreatitis with small acute peripancreatic collection    Past Medical History:  Diagnosis Date  . Alcohol abuse   . Anxiety   . Bipolar 1 disorder ([place])   . GERD (gastroesophageal reflux disease)   . Pancreatitis   . PTSD (post-traumatic stress disorder)     Past Surgical History:  Procedure Laterality Date  . ABDOMINAL HYSTERECTOMY    . FOOT SURGERY    . OTHER SURGICAL HISTORY     ovarian surgery  . TUBAL LIGATION     Family History  Problem Relation Age of Onset  . Hypertension Mother   . Breast cancer Mother   . Hypertension Father   . Heart attack Cousin 30       died in 77s of MI  . Colon cancer Neg Hx    Social History   Tobacco Use  . Smoking status: Current Every Day Smoker  Packs/day: 0.50    Types: Cigarettes  . Smokeless tobacco: Never Used  . Tobacco comment: 2-3 cigarettes daily  Substance Use Topics  . Alcohol use: Not Currently    Comment: no alchol for the past 3 weeks  . Drug use: No   Current Outpatient Medications  Medication Sig Dispense Refill  . albuterol (PROVENTIL HFA;VENTOLIN HFA) 108 (90 Base) MCG/ACT inhaler Inhale 1-2 puffs into the lungs every 6 (six) hours as needed for wheezing or shortness of  breath. 1 Inhaler 3  . dicyclomine (BENTYL) 20 MG tablet Take 1 tablet (20 mg total) by mouth 2 (two) times daily. 20 tablet 0  . mometasone-formoterol (DULERA) 200-5 MCG/ACT AERO Inhale 2 puffs into the lungs 2 (two) times daily. 5 Inhaler 0  . nicotine (NICODERM CQ - DOSED IN MG/24 HOURS) 21 mg/24hr patch Place 1 patch (21 mg total) onto the skin daily. 28 patch 0  . omeprazole (PRILOSEC) 20 MG capsule Take 2 capsules (40 mg total) by mouth daily. 60 capsule 0  . ondansetron (ZOFRAN) 4 MG tablet Take 1 tablet (4 mg total) by mouth every 8 (eight) hours as needed for nausea or vomiting. 4 tablet 0   No current facility-administered medications for this visit.    No Known Allergies   Review of Systems: Positive for headaches, sleeping problems and sore throat. All other systems reviewed and negative except where noted in HPI.   Physical Exam:    Wt Readings from Last 3 Encounters:  12/11/17 110 lb 8 oz (50.1 kg)  12/01/17 111 lb (50.3 kg)  11/18/17 111 lb (50.3 kg)    BP (!) 86/62   Pulse 88   Ht 5' 4.5" (1.638 m)   Wt 110 lb 8 oz (50.1 kg)   BMI 18.67 kg/m  Constitutional:  Thin black female in no acute distress. Psychiatric: Normal mood and affect. Behavior is normal. EENT: Pupils normal.  Conjunctivae are normal. No scleral icterus. Neck supple.  Cardiovascular: Normal rate, regular rhythm. No edema Pulmonary/chest: Effort normal and breath sounds normal. No wheezing, rales or rhonchi. Abdominal: Soft, nondistended. Mild diffuse upper abdominal tenderness. Bowel sounds active throughout. There are no masses palpable. No hepatomegaly. Neurological: Alert and oriented to person place and time. Skin: Skin is warm and dry. No rashes noted.  Tye Savoy, NP  12/11/2017, 2:11 PM  Cc: Louellen Molder, MD

## 2017-12-13 ENCOUNTER — Encounter: Payer: Self-pay | Admitting: Nurse Practitioner

## 2017-12-17 ENCOUNTER — Other Ambulatory Visit: Payer: Self-pay | Admitting: Nurse Practitioner

## 2017-12-17 ENCOUNTER — Ambulatory Visit (HOSPITAL_COMMUNITY)
Admission: RE | Admit: 2017-12-17 | Discharge: 2017-12-17 | Disposition: A | Discharge status: Home / Self Care | Payer: Medicaid Other | Source: Ambulatory Visit | Attending: Nurse Practitioner | Admitting: Nurse Practitioner

## 2017-12-17 DIAGNOSIS — K5900 Constipation, unspecified: Secondary | ICD-10-CM

## 2017-12-17 DIAGNOSIS — K869 Disease of pancreas, unspecified: Secondary | ICD-10-CM | POA: Diagnosis not present

## 2017-12-17 DIAGNOSIS — K859 Acute pancreatitis without necrosis or infection, unspecified: Secondary | ICD-10-CM

## 2017-12-17 MED ORDER — GADOBENATE DIMEGLUMINE 529 MG/ML IV SOLN
10.0000 mL | Freq: Once | INTRAVENOUS | Status: AC | PRN
  Administered 2017-12-17: 9 mL via INTRAVENOUS

## 2017-12-19 ENCOUNTER — Other Ambulatory Visit: Payer: Self-pay

## 2017-12-19 ENCOUNTER — Telehealth: Payer: Self-pay

## 2017-12-19 DIAGNOSIS — K859 Acute pancreatitis without necrosis or infection, unspecified: Secondary | ICD-10-CM

## 2017-12-19 DIAGNOSIS — K2980 Duodenitis without bleeding: Secondary | ICD-10-CM

## 2017-12-19 NOTE — Telephone Encounter (Signed)
Sorry, I didn't notice there were plans for EGD.  I think EUS will give good evaluation of UGI tract lumen and pancreas and so no need for the EGD.

## 2017-12-19 NOTE — Telephone Encounter (Signed)
-----   Message from Milus Banister, MD sent at 12/19/2017  8:58 AM EDT ----- Carlyle Lipa; Thanks.  I think EUS is a good next step here.  Can you let her know about that plan and we'll get in touch with her to schedule it.  Thanks  Yzabelle Calles, She needs upper EUS, radial +/- linear, next EUS Thursday with MAC sedation for abnormal duodenum, recent pancreatitis.  Thanks  DJ   ----- Message ----- From: Jerene Bears, MD Sent: 12/18/2017   4:36 PM To: Milus Banister, MD, Willia Craze, NP, #  Nevin Bloodgood With this finding and her recent pancreatitis, I think she needs EUS as soon as we can get her in. DU remains possible, but is felt less likely given pancreatitis history.  Linna Hoff,  Can you please review this MRI and give [place] your opinion about possible EUS Thanks for your help JMP   ----- Message ----- From: Willia Craze, NP Sent: 12/18/2017   4:15 PM To: Jerene Bears, MD  Hi Ulice Dash,  I am over at the hospital but MRI called to my nurse. Can you review this? Thanks PG

## 2017-12-19 NOTE — Telephone Encounter (Signed)
I have contacted and instructed the patient.  She needs to know if the EGD on 12/24/17 will be cancelled. Thanks.

## 2017-12-19 NOTE — Telephone Encounter (Signed)
Noted! Thank you

## 2017-12-19 NOTE — Telephone Encounter (Signed)
Dr Ardis Hughs please advise if it is ok to cancel EGD prior to EUS.

## 2017-12-19 NOTE — Telephone Encounter (Signed)
Felicia Perkins,   See Dr Ardis Hughs note.  I have cancelled the EGD and have called the pt and advised her as well.

## 2017-12-19 NOTE — Telephone Encounter (Signed)
Felicia Perkins,   She has been scheduled and the instructions are in letters.  I put in the amb referral and hospital orders.

## 2017-12-23 ENCOUNTER — Ambulatory Visit: Payer: Self-pay | Admitting: Family Medicine

## 2017-12-24 ENCOUNTER — Encounter: Payer: Self-pay | Admitting: Internal Medicine

## 2017-12-26 ENCOUNTER — Other Ambulatory Visit: Payer: Self-pay

## 2017-12-26 ENCOUNTER — Encounter (HOSPITAL_COMMUNITY): Payer: Self-pay

## 2018-01-02 ENCOUNTER — Other Ambulatory Visit: Payer: Self-pay

## 2018-01-02 ENCOUNTER — Ambulatory Visit (HOSPITAL_COMMUNITY): Payer: Medicaid Other | Admitting: Anesthesiology

## 2018-01-02 ENCOUNTER — Encounter: Payer: Self-pay | Admitting: Nurse Practitioner

## 2018-01-02 ENCOUNTER — Encounter (HOSPITAL_COMMUNITY): Payer: Self-pay

## 2018-01-02 ENCOUNTER — Encounter (HOSPITAL_COMMUNITY)
Admission: RE | Disposition: A | Discharge status: Home / Self Care | Payer: Self-pay | Source: Ambulatory Visit | Attending: Gastroenterology

## 2018-01-02 ENCOUNTER — Ambulatory Visit (HOSPITAL_COMMUNITY)
Admission: RE | Admit: 2018-01-02 | Discharge: 2018-01-02 | Disposition: A | Discharge status: Home / Self Care | Payer: Medicaid Other | Source: Ambulatory Visit | Attending: Gastroenterology | Admitting: Gastroenterology

## 2018-01-02 DIAGNOSIS — Z79899 Other long term (current) drug therapy: Secondary | ICD-10-CM | POA: Diagnosis not present

## 2018-01-02 DIAGNOSIS — K2970 Gastritis, unspecified, without bleeding: Secondary | ICD-10-CM | POA: Insufficient documentation

## 2018-01-02 DIAGNOSIS — K319 Disease of stomach and duodenum, unspecified: Secondary | ICD-10-CM | POA: Diagnosis not present

## 2018-01-02 DIAGNOSIS — K3189 Other diseases of stomach and duodenum: Secondary | ICD-10-CM

## 2018-01-02 DIAGNOSIS — F319 Bipolar disorder, unspecified: Secondary | ICD-10-CM | POA: Insufficient documentation

## 2018-01-02 DIAGNOSIS — F17210 Nicotine dependence, cigarettes, uncomplicated: Secondary | ICD-10-CM | POA: Diagnosis not present

## 2018-01-02 DIAGNOSIS — J449 Chronic obstructive pulmonary disease, unspecified: Secondary | ICD-10-CM | POA: Diagnosis not present

## 2018-01-02 DIAGNOSIS — K859 Acute pancreatitis without necrosis or infection, unspecified: Secondary | ICD-10-CM

## 2018-01-02 DIAGNOSIS — K2980 Duodenitis without bleeding: Secondary | ICD-10-CM

## 2018-01-02 DIAGNOSIS — K219 Gastro-esophageal reflux disease without esophagitis: Secondary | ICD-10-CM | POA: Insufficient documentation

## 2018-01-02 DIAGNOSIS — K5900 Constipation, unspecified: Secondary | ICD-10-CM | POA: Insufficient documentation

## 2018-01-02 HISTORY — DX: Malignant (primary) neoplasm, unspecified: C80.1

## 2018-01-02 HISTORY — PX: EUS: SHX5427

## 2018-01-02 HISTORY — PX: ESOPHAGOGASTRODUODENOSCOPY (EGD) WITH PROPOFOL: SHX5813

## 2018-01-02 HISTORY — PX: BIOPSY: SHX5522

## 2018-01-02 SURGERY — UPPER ENDOSCOPIC ULTRASOUND (EUS) RADIAL
Anesthesia: Monitor Anesthesia Care

## 2018-01-02 MED ORDER — PROPOFOL 10 MG/ML IV BOLUS
INTRAVENOUS | Status: DC | PRN
  Administered 2018-01-02 (×17): 20 mg via INTRAVENOUS

## 2018-01-02 MED ORDER — PROPOFOL 10 MG/ML IV BOLUS
INTRAVENOUS | Status: AC
  Filled 2018-01-02: qty 40

## 2018-01-02 MED ORDER — LACTATED RINGERS IV SOLN
INTRAVENOUS | Status: DC
  Administered 2018-01-02 (×2): via INTRAVENOUS

## 2018-01-02 MED ORDER — PROPOFOL 500 MG/50ML IV EMUL
INTRAVENOUS | Status: DC | PRN
  Administered 2018-01-02: 100 ug/kg/min via INTRAVENOUS

## 2018-01-02 MED ORDER — PROPOFOL 10 MG/ML IV BOLUS
INTRAVENOUS | Status: AC
  Filled 2018-01-02: qty 20

## 2018-01-02 MED ORDER — SODIUM CHLORIDE 0.9 % IV SOLN
INTRAVENOUS | Status: DC
Start: 2018-01-02 — End: 2018-01-02

## 2018-01-02 NOTE — Interval H&P Note (Signed)
History and Physical Interval Note:  01/02/2018 10:56 AM  Felicia Perkins  has presented today for surgery, with the diagnosis of abn duodenum, recent pancreatitis  The various methods of treatment have been discussed with the patient and family. After consideration of risks, benefits and other options for treatment, the patient has consented to  Procedure(s): UPPER ENDOSCOPIC ULTRASOUND (EUS) RADIAL (N/A) as a surgical intervention .  The patient's history has been reviewed, patient examined, no change in status, stable for surgery.  I have reviewed the patient's chart and labs.  Questions were answered to the patient's satisfaction.     Milus Banister

## 2018-01-02 NOTE — Transfer of Care (Signed)
Immediate Anesthesia Transfer of Care Note  Patient: Felicia Perkins  Procedure(s) Performed: UPPER ENDOSCOPIC ULTRASOUND (EUS) RADIAL (N/A ) BIOPSY  Patient Location: PACU  Anesthesia Type:MAC  Level of Consciousness: sedated  Airway & Oxygen Therapy: Patient connected to nasal cannula oxygen  Post-op Assessment: Report given to RN and Post -op Vital signs reviewed and stable  Post vital signs: Reviewed and stable  Last Vitals:  Vitals Value Taken Time  BP    Temp    Pulse    Resp    SpO2      Last Pain:  Vitals:   01/02/18 1009  TempSrc: Oral  PainSc: 4          Complications: No apparent anesthesia complications

## 2018-01-02 NOTE — Anesthesia Preprocedure Evaluation (Signed)
Anesthesia Evaluation  Patient identified by MRN, date of birth, ID band Patient awake    Reviewed: Allergy & Precautions, NPO status , Patient's Chart, lab work & pertinent test results  Airway Mallampati: II  TM Distance: >3 FB Neck ROM: Full    Dental no notable dental hx.    Pulmonary neg pulmonary ROS, COPD, Current Smoker,    Pulmonary exam normal breath sounds clear to auscultation       Cardiovascular negative cardio ROS Normal cardiovascular exam Rhythm:Regular Rate:Normal     Neuro/Psych Anxiety Bipolar Disorder negative neurological ROS  negative psych ROS   GI/Hepatic negative GI ROS, Neg liver ROS, GERD  ,  Endo/Other  negative endocrine ROS  Renal/GU negative Renal ROS  negative genitourinary   Musculoskeletal negative musculoskeletal ROS (+)   Abdominal   Peds negative pediatric ROS (+)  Hematology negative hematology ROS (+)   Anesthesia Other Findings Alcohol abuse   Reproductive/Obstetrics negative OB ROS                             Anesthesia Physical Anesthesia Plan  ASA: III  Anesthesia Plan: MAC   Post-op Pain Management:    Induction: Intravenous  PONV Risk Score and Plan: 1 and Treatment may vary due to age or medical condition  Airway Management Planned: Nasal Cannula  Additional Equipment:   Intra-op Plan:   Post-operative Plan:   Informed Consent: I have reviewed the patients History and Physical, chart, labs and discussed the procedure including the risks, benefits and alternatives for the proposed anesthesia with the patient or authorized representative who has indicated his/her understanding and acceptance.   Dental advisory given  Plan Discussed with: CRNA  Anesthesia Plan Comments:         Anesthesia Quick Evaluation

## 2018-01-02 NOTE — Op Note (Signed)
[facility] Patient Name: Felicia Perkins Procedure Date: 01/02/2018 MRN: [id number] Attending MD: Milus Banister , MD Date of Birth: [birth year] CSN: [id number] Age: 42 Admit Type: Outpatient Procedure:                Upper EUS Indications:              Admitted in early April with abd pain, mild acute                            pancreatitis; had been taking numerous NSAIDs daily                            for months, binge drinks etoh. CT and MRI (most                            recently) suggest abnormal area in head of pancreas. Providers:                Milus Banister, MD, Carolynn Comment RN, RN, Cletis [place], Technician Referring MD:             Zenovia Jarred, MD Medicines:                Monitored Anesthesia Care Complications:            No immediate complications. Estimated blood loss:                            None. Estimated Blood Loss:     Estimated blood loss: none. Procedure:                Pre-Anesthesia Assessment:                           - Prior to the procedure, a History and Physical                            was performed, and patient medications and                            allergies were reviewed. The patient's tolerance of                            previous anesthesia was also reviewed. The risks                            and benefits of the procedure and the sedation                            options and risks were discussed with the patient.                            All questions were answered, and informed consent  was obtained. Prior Anticoagulants: The patient has                            taken no previous anticoagulant or antiplatelet                            agents. ASA Grade Assessment: II - A patient with                            mild systemic disease. After reviewing the risks                            and benefits, the patient was deemed in   satisfactory condition to undergo the procedure.                           After obtaining informed consent, the endoscope was                            passed under direct vision. Throughout the                            procedure, the patient's blood pressure, pulse, and                            oxygen saturations were monitored continuously. The                            [id number] ([id number]) scope was introduced through the                            mouth, and advanced to the second part of duodenum.                            The upper EUS was accomplished without difficulty.                            The patient tolerated the procedure well. Scope In: Scope Out: Findings:      ENDOSCOPIC FINDING: :      Normal esophagus.      Mild inflammation was found in the gastric antrum. Biopsies were taken       with a cold forceps for histology.      Mild duodenitis without focal ulcers or tumors (evaluated with standard       adult gastroscope as well as side viewing duodenoscope).      ENDOSONOGRAPHIC FINDING: :      1. Poor imaging of the ampulla and proximal head of the pancreas (I was       unable to maneuver either the radial or linear echoendoscopes past the       duodenal bulb).      2. No clear masses were noted in the pancreas or duodenum with the       limitation noted above.      3. CBD was normal, non-dilated.      4. Main pancreatic duct was normal, non-dilated.  5. Gallbladder was normal, non-dilated.      6. Limited views of the liver, spleen, portal and splenic vessels were       all normal. Impression:               - Mild gastritis, biopsied to check for H. pylori.                           - No frank ulcers in the UGI tract.                           - Limited visualization of the ampulla, head of                            pancreas however no clear tumors or masses noted.                           - I am most suspicious that she had NSAID related                             ulcer othe duodenum that has since healed since                            completely stopping NSAIDs and taking PPI 40mg  once                            daily (was taking multiple NSAIDs daily for months). Moderate Sedation:      N/A- Per Anesthesia Care Recommendation:           - Discharge patient to home (ambulatory).                           - She knows to continue to stay off NSAIDs and to                            take PPI once daily.                           - Will repeat imaging of the pancreas (CT                            abd/pelvis in 3 months). Procedure Code(s):        --- Professional ---                           [phone], Esophagogastroduodenoscopy, flexible,                            transoral; with endoscopic ultrasound examination                            limited to the esophagus, stomach or duodenum, and                            adjacent structures  16109, 59, Esophagogastroduodenoscopy, flexible,                            transoral; with biopsy, single or multiple Diagnosis Code(s):        --- Professional ---                           K29.70, Gastritis, unspecified, without bleeding                           R93.3, Abnormal findings on diagnostic imaging of                            other parts of digestive tract CPT copyright 2017 [employer]. All rights reserved. The codes documented in this report are preliminary and upon coder review may  be revised to meet current compliance requirements. Milus Banister, MD 01/02/2018 12:28:37 PM This report has been signed electronically. Number of Addenda: 0

## 2018-01-02 NOTE — Discharge Instructions (Signed)
YOU HAD AN ENDOSCOPIC PROCEDURE TODAY: Refer to the procedure report and other information in the discharge instructions given to you for any specific questions about what was found during the examination. If this information does not answer your questions, please call [place] office at [phone] to clarify.   YOU SHOULD EXPECT: Some feelings of bloating in the abdomen. Passage of more gas than usual. Walking can help get rid of the air that was put into your GI tract during the procedure and reduce the bloating. If you had a lower endoscopy (such as a colonoscopy or flexible sigmoidoscopy) you may notice spotting of blood in your stool or on the toilet paper. Some abdominal soreness may be present for a day or two, also.  DIET: Your first meal following the procedure should be a light meal and then it is ok to progress to your normal diet. A half-sandwich or bowl of soup is an example of a good first meal. Heavy or fried foods are harder to digest and may make you feel nauseous or bloated. Drink plenty of fluids but you should avoid alcoholic beverages for 24 hours. If you had a esophageal dilation, please see attached instructions for diet.    ACTIVITY: Your care partner should take you home directly after the procedure. You should plan to take it easy, moving slowly for the rest of the day. You can resume normal activity the day after the procedure however YOU SHOULD NOT DRIVE, use power tools, machinery or perform tasks that involve climbing or major physical exertion for 24 hours (because of the sedation medicines used during the test).   SYMPTOMS TO REPORT IMMEDIATELY: A gastroenterologist can be reached at any hour. Please call [phone]  for any of the following symptoms:   Following upper endoscopy (EGD, EUS, ERCP, esophageal dilation) Vomiting of blood or coffee ground material  New, significant abdominal pain  New, significant chest pain or pain under the shoulder blades  Painful or  persistently difficult swallowing  New shortness of breath  Black, tarry-looking or red, bloody stools  FOLLOW UP:  If any biopsies were taken you will be contacted by phone or by letter within the next 1-3 weeks. Call [phone]  if you have not heard about the biopsies in 3 weeks.  Please also call with any specific questions about appointments or follow up tests.  

## 2018-01-02 NOTE — Anesthesia Postprocedure Evaluation (Signed)
Anesthesia Post Note  Patient: Felicia Perkins  Procedure(s) Performed: UPPER ENDOSCOPIC ULTRASOUND (EUS) RADIAL (N/A ) BIOPSY     Patient location during evaluation: PACU Anesthesia Type: MAC Level of consciousness: awake and alert Pain management: pain level controlled Vital Signs Assessment: post-procedure vital signs reviewed and stable Respiratory status: spontaneous breathing, nonlabored ventilation and respiratory function stable Cardiovascular status: stable and blood pressure returned to baseline Postop Assessment: no apparent nausea or vomiting Anesthetic complications: no    Last Vitals:  Vitals:   01/02/18 1221 01/02/18 1231  BP: 103/77 118/79  Pulse: 79 65  Resp: 14 12  Temp: 36.4 C   SpO2: 100% 100%    Last Pain:  Vitals:   01/02/18 1231  TempSrc:   PainSc: 0-No pain                 Lynda Rainwater

## 2018-01-03 ENCOUNTER — Encounter (HOSPITAL_COMMUNITY): Payer: Self-pay | Admitting: Gastroenterology

## 2018-01-08 ENCOUNTER — Other Ambulatory Visit: Payer: Self-pay

## 2018-01-08 ENCOUNTER — Telehealth: Payer: Self-pay

## 2018-01-08 DIAGNOSIS — K852 Alcohol induced acute pancreatitis without necrosis or infection: Secondary | ICD-10-CM

## 2018-01-08 DIAGNOSIS — K2980 Duodenitis without bleeding: Secondary | ICD-10-CM

## 2018-01-08 DIAGNOSIS — K859 Acute pancreatitis without necrosis or infection, unspecified: Secondary | ICD-10-CM

## 2018-01-08 NOTE — Telephone Encounter (Signed)
Patient notified of follow up CT 03/11/18 at 10:00 am. Instructions mailed. She will pick up the contrast here or at [place].

## 2018-01-08 NOTE — Telephone Encounter (Signed)
-----  Message from Willia Craze, NP sent at 01/07/2018 10:41 AM EDT ----- Eustaquio Maize, please arrange for CT scan abd/pelvis of the pancreas in 2-3 months for follow up. Also, Dr. Ardis Hughs is involved in case (had EUS) and I think a follow up with him after the CT scan is appropriate so would you arrange for that. I know pyrtle signed my office note but he has never met her and jacobs has, hence the switch.  Thanks.

## 2018-01-21 ENCOUNTER — Encounter: Payer: Self-pay | Admitting: Nurse Practitioner

## 2018-01-22 ENCOUNTER — Encounter: Payer: Self-pay | Admitting: Family Medicine

## 2018-02-12 ENCOUNTER — Other Ambulatory Visit: Payer: Self-pay | Admitting: Family Medicine

## 2018-02-12 DIAGNOSIS — K2990 Gastroduodenitis, unspecified, without bleeding: Principal | ICD-10-CM

## 2018-02-12 DIAGNOSIS — K2970 Gastritis, unspecified, without bleeding: Secondary | ICD-10-CM

## 2018-02-12 NOTE — Telephone Encounter (Signed)
Patient called requesting a refill on omeprazole (PRILOSEC) 20 MG capsule  [facility] uses [facility]. Please f/u

## 2018-02-14 MED ORDER — OMEPRAZOLE 20 MG PO CPDR: 40.0000 mg | DELAYED_RELEASE_CAPSULE | Freq: Every day | ORAL | 0 refills | Status: DC

## 2018-02-27 ENCOUNTER — Encounter: Payer: Self-pay | Admitting: Family Medicine

## 2018-03-11 ENCOUNTER — Other Ambulatory Visit: Payer: Self-pay

## 2018-03-14 ENCOUNTER — Encounter: Payer: Self-pay | Admitting: Nurse Practitioner

## 2018-03-18 ENCOUNTER — Ambulatory Visit: Payer: Self-pay | Admitting: Gastroenterology

## 2018-03-24 ENCOUNTER — Ambulatory Visit (INDEPENDENT_AMBULATORY_CARE_PROVIDER_SITE_OTHER)
Admission: RE | Admit: 2018-03-24 | Discharge: 2018-03-24 | Disposition: A | Discharge status: Home / Self Care | Payer: Medicaid Other | Source: Ambulatory Visit | Attending: Nurse Practitioner | Admitting: Nurse Practitioner

## 2018-03-24 DIAGNOSIS — K852 Alcohol induced acute pancreatitis without necrosis or infection: Secondary | ICD-10-CM

## 2018-03-24 DIAGNOSIS — K859 Acute pancreatitis without necrosis or infection, unspecified: Secondary | ICD-10-CM

## 2018-03-24 DIAGNOSIS — K2980 Duodenitis without bleeding: Secondary | ICD-10-CM | POA: Diagnosis not present

## 2018-03-24 MED ORDER — IOPAMIDOL (ISOVUE-300) INJECTION 61%
100.0000 mL | Freq: Once | INTRAVENOUS | Status: AC | PRN
  Administered 2018-03-24: 100 mL via INTRAVENOUS

## 2018-04-15 ENCOUNTER — Telehealth: Payer: Self-pay | Admitting: Family Medicine

## 2018-04-15 ENCOUNTER — Encounter: Payer: Self-pay | Admitting: Family Medicine

## 2018-04-15 DIAGNOSIS — K2990 Gastroduodenitis, unspecified, without bleeding: Principal | ICD-10-CM

## 2018-04-15 DIAGNOSIS — K2970 Gastritis, unspecified, without bleeding: Secondary | ICD-10-CM

## 2018-04-15 MED ORDER — OMEPRAZOLE 20 MG PO CPDR: 40.0000 mg | DELAYED_RELEASE_CAPSULE | Freq: Every day | ORAL | 2 refills | Status: DC

## 2018-04-15 NOTE — Telephone Encounter (Signed)
Patient called requesting a refill for omeprazole (PRILOSEC) 20 MG   To: [place], [place] - [address]

## 2018-04-16 ENCOUNTER — Encounter: Payer: Self-pay | Admitting: Family Medicine

## 2018-05-14 ENCOUNTER — Ambulatory Visit: Payer: Medicaid Other | Admitting: Gastroenterology

## 2018-06-04 ENCOUNTER — Encounter: Payer: Self-pay | Admitting: Family Medicine

## 2018-06-04 ENCOUNTER — Ambulatory Visit: Payer: Medicaid Other | Attending: Family Medicine | Admitting: Family Medicine

## 2018-06-04 VITALS — BP 138/80 | HR 82 | Temp 97.8°F | Ht 64.5 in | Wt 111.8 lb

## 2018-06-04 DIAGNOSIS — Z90710 Acquired absence of both cervix and uterus: Secondary | ICD-10-CM | POA: Insufficient documentation

## 2018-06-04 DIAGNOSIS — K2970 Gastritis, unspecified, without bleeding: Secondary | ICD-10-CM | POA: Diagnosis not present

## 2018-06-04 DIAGNOSIS — F1020 Alcohol dependence, uncomplicated: Secondary | ICD-10-CM | POA: Diagnosis not present

## 2018-06-04 DIAGNOSIS — Z8541 Personal history of malignant neoplasm of cervix uteri: Secondary | ICD-10-CM | POA: Insufficient documentation

## 2018-06-04 DIAGNOSIS — J438 Other emphysema: Secondary | ICD-10-CM | POA: Diagnosis not present

## 2018-06-04 DIAGNOSIS — F4310 Post-traumatic stress disorder, unspecified: Secondary | ICD-10-CM | POA: Diagnosis not present

## 2018-06-04 DIAGNOSIS — K219 Gastro-esophageal reflux disease without esophagitis: Secondary | ICD-10-CM | POA: Insufficient documentation

## 2018-06-04 DIAGNOSIS — Z9851 Tubal ligation status: Secondary | ICD-10-CM | POA: Diagnosis not present

## 2018-06-04 DIAGNOSIS — Z9889 Other specified postprocedural states: Secondary | ICD-10-CM | POA: Insufficient documentation

## 2018-06-04 DIAGNOSIS — Z79899 Other long term (current) drug therapy: Secondary | ICD-10-CM | POA: Insufficient documentation

## 2018-06-04 DIAGNOSIS — F319 Bipolar disorder, unspecified: Secondary | ICD-10-CM | POA: Diagnosis not present

## 2018-06-04 DIAGNOSIS — K2990 Gastroduodenitis, unspecified, without bleeding: Secondary | ICD-10-CM | POA: Insufficient documentation

## 2018-06-04 DIAGNOSIS — Z23 Encounter for immunization: Secondary | ICD-10-CM

## 2018-06-04 DIAGNOSIS — J439 Emphysema, unspecified: Secondary | ICD-10-CM | POA: Insufficient documentation

## 2018-06-04 MED ORDER — NICOTINE 21 MG/24HR TD PT24: 21.0000 mg | MEDICATED_PATCH | Freq: Every day | TRANSDERMAL | 3 refills | Status: DC

## 2018-06-04 MED ORDER — ALBUTEROL SULFATE HFA 108 (90 BASE) MCG/ACT IN AERS: 1.0000 | INHALATION_SPRAY | Freq: Four times a day (QID) | RESPIRATORY_TRACT | 3 refills | Status: DC | PRN

## 2018-06-04 MED ORDER — MOMETASONE FURO-FORMOTEROL FUM 200-5 MCG/ACT IN AERO: 2.0000 | INHALATION_SPRAY | Freq: Two times a day (BID) | RESPIRATORY_TRACT | 3 refills | Status: DC

## 2018-06-04 MED ORDER — OMEPRAZOLE 20 MG PO CPDR: 40.0000 mg | DELAYED_RELEASE_CAPSULE | Freq: Every day | ORAL | 2 refills | Status: DC

## 2018-06-04 NOTE — Progress Notes (Signed)
Subjective:  Patient ID: Felicia Perkins, female    DOB: [birth year]  Age: 42 y.o. MRN: [id number]  CC: COPD   HPI [name]  is a 42 year old female with a history of alcoholic liver cirrhosis, COPD, alcohol abuse who presents today for follow-up visit. She had a hospitalization for acute pancreatitis secondary to alcohol abuse in 12/2017 and had quit alcohol consumption but then relapsed again last month due to underlying psychosocial stressors. She is currently undergoing rehab at behavioral health and is also followed by [facility] for [facility] health.  Sometimes has anxiety and depression but denies suicidal ideations or intents.  She has an appointment today to see a therapist and will be evaluated for commencement of pharmacotherapy. She has a strong result to quit completely. Her emphysema is doing great with no exacerbations and she has been compliant with her inhalers. She also takes a PPI for gastritis and denies abdominal pain, nausea or vomiting. She has no additional concerns at this time.  Past Medical History:  Diagnosis Date  . Alcohol abuse   . Anxiety   . Bipolar 1 disorder ([place])   . Cancer (HCC)    cervical cancer  . GERD (gastroesophageal reflux disease)   . Pancreatitis   . PTSD (post-traumatic stress disorder)     Past Surgical History:  Procedure Laterality Date  . ABDOMINAL HYSTERECTOMY  2004  . BIOPSY  01/02/2018   Procedure: BIOPSY;  Surgeon: Milus Banister, MD;  Location: [facility];  Service: Endoscopy;;  . ESOPHAGOGASTRODUODENOSCOPY (EGD) WITH PROPOFOL N/A 01/02/2018   Procedure: ESOPHAGOGASTRODUODENOSCOPY (EGD) WITH PROPOFOL;  Surgeon: Milus Banister, MD;  Location: [facility];  Service: Endoscopy;  Laterality: N/A;  . EUS N/A 01/02/2018   Procedure: UPPER ENDOSCOPIC ULTRASOUND (EUS) RADIAL;  Surgeon: Milus Banister, MD;  Location: [facility];  Service: Endoscopy;  Laterality: N/A;  . FOOT SURGERY    . OTHER SURGICAL HISTORY     ovarian surgery  . TUBAL LIGATION      No Known Allergies   Outpatient Medications Prior to Visit  Medication Sig Dispense Refill  . Multiple Vitamins-Calcium (ONE-A-DAY WOMENS PO) Take 1 tablet by mouth daily.    Marland Kitchen albuterol (PROVENTIL HFA;VENTOLIN HFA) 108 (90 Base) MCG/ACT inhaler Inhale 1-2 puffs into the lungs every 6 (six) hours as needed for wheezing or shortness of breath. 1 Inhaler 3  . mometasone-formoterol (DULERA) 200-5 MCG/ACT AERO Inhale 2 puffs into the lungs 2 (two) times daily. 5 Inhaler 0  . omeprazole (PRILOSEC) 20 MG capsule Take 2 capsules (40 mg total) by mouth daily. 60 capsule 2  . dicyclomine (BENTYL) 20 MG tablet Take 1 tablet (20 mg total) by mouth 2 (two) times daily. (Patient not taking: Reported on 12/25/2017) 20 tablet 0  . hydrocortisone (ANUSOL-HC) 2.5 % rectal cream Place 1 application rectally at bedtime. Use in rectum for 7 days. 30 g 1  . ondansetron (ZOFRAN) 4 MG tablet Take 1 tablet (4 mg total) by mouth every 8 (eight) hours as needed for nausea or vomiting. (Patient not taking: Reported on 12/25/2017) 4 tablet 0  . nicotine (NICODERM CQ - DOSED IN MG/24 HOURS) 21 mg/24hr patch Place 1 patch (21 mg total) onto the skin daily. (Patient not taking: Reported on 12/25/2017) 28 patch 0   No facility-administered medications prior to visit.     ROS Review of Systems  Constitutional: Negative for activity change, appetite change and fatigue.  HENT: Negative for congestion, sinus pressure and sore throat.  Eyes: Negative for visual disturbance.  Respiratory: Negative for cough, chest tightness, shortness of breath and wheezing.   Cardiovascular: Negative for chest pain and palpitations.  Gastrointestinal: Negative for abdominal distention, abdominal pain and constipation.  Endocrine: Negative for polydipsia.  Genitourinary: Negative for dysuria and frequency.  Musculoskeletal: Negative for arthralgias and back pain.  Skin: Negative for rash.    Neurological: Negative for tremors, light-headedness and numbness.  Hematological: Does not bruise/bleed easily.  Psychiatric/Behavioral: Negative for agitation and behavioral problems.    Objective:  BP 138/80   Pulse 82   Temp 97.8 F (36.6 C) (Oral)   Ht 5' 4.5" (1.638 m)   Wt 111 lb 12.8 oz (50.7 kg)   SpO2 98%   BMI 18.89 kg/m   BP/Weight 06/04/2018 02/10/1600 0/04/3234  Systolic BP 573 220 86  Diastolic BP 80 75 62  Wt. (Lbs) 111.8 113 110.5  BMI 18.89 19.4 18.67  Some encounter information is confidential and restricted. Go to Review Flowsheets activity to see all data.      Physical Exam  Constitutional: She is oriented to person, place, and time. She appears well-developed and well-nourished.  Neck: No JVD present.  Cardiovascular: Normal rate, normal heart sounds and intact distal pulses.  No murmur heard. Pulmonary/Chest: Effort normal and breath sounds normal. She has no wheezes. She has no rales. She exhibits no tenderness.  Abdominal: Soft. Bowel sounds are normal. She exhibits no distension and no mass. There is no tenderness.  Musculoskeletal: Normal range of motion.  Neurological: She is alert and oriented to person, place, and time.  Skin: Skin is warm and dry.  Psychiatric: She has a normal mood and affect.     Assessment & Plan:   1. Other emphysema ([place]) No acute exacerbations - mometasone-formoterol (DULERA) 200-5 MCG/ACT AERO; Inhale 2 puffs into the lungs 2 (two) times daily.  Dispense: 1 Inhaler; Refill: 3 - albuterol (PROVENTIL HFA;VENTOLIN HFA) 108 (90 Base) MCG/ACT inhaler; Inhale 1-2 puffs into the lungs every 6 (six) hours as needed for wheezing or shortness of breath.  Dispense: 1 Inhaler; Refill: 3  2. Gastritis and gastroduodenitis Controlled - omeprazole (PRILOSEC) 20 MG capsule; Take 2 capsules (40 mg total) by mouth daily.  Dispense: 60 capsule; Refill: 2  3. Uncomplicated alcohol dependence ([place]) Recently relapsed Currently  undergoing rehab  4. Need for immunization against influenza - Flu Vaccine QUAD 36+ mos IM   Meds ordered this encounter  Medications  . mometasone-formoterol (DULERA) 200-5 MCG/ACT AERO    Sig: Inhale 2 puffs into the lungs 2 (two) times daily.    Dispense:  1 Inhaler    Refill:  3  . albuterol (PROVENTIL HFA;VENTOLIN HFA) 108 (90 Base) MCG/ACT inhaler    Sig: Inhale 1-2 puffs into the lungs every 6 (six) hours as needed for wheezing or shortness of breath.    Dispense:  1 Inhaler    Refill:  3  . omeprazole (PRILOSEC) 20 MG capsule    Sig: Take 2 capsules (40 mg total) by mouth daily.    Dispense:  60 capsule    Refill:  2  . nicotine (NICODERM CQ - DOSED IN MG/24 HOURS) 21 mg/24hr patch    Sig: Place 1 patch (21 mg total) onto the skin daily.    Dispense:  28 patch    Refill:  3    Follow-up: Return in about 3 months (around 09/04/2018) for Follow up on chronic medical conditions.   Charlott Rakes MD

## 2018-06-05 ENCOUNTER — Encounter: Payer: Self-pay | Admitting: Family Medicine

## 2018-09-03 ENCOUNTER — Ambulatory Visit: Payer: Self-pay | Admitting: Family Medicine

## 2018-10-09 ENCOUNTER — Encounter: Payer: Self-pay | Admitting: Family Medicine

## 2018-10-09 ENCOUNTER — Ambulatory Visit: Payer: Medicaid Other | Attending: Family Medicine | Admitting: Family Medicine

## 2018-10-09 VITALS — BP 142/90 | HR 72 | Temp 98.1°F | Ht 64.5 in | Wt 113.4 lb

## 2018-10-09 DIAGNOSIS — F1010 Alcohol abuse, uncomplicated: Secondary | ICD-10-CM

## 2018-10-09 DIAGNOSIS — L309 Dermatitis, unspecified: Secondary | ICD-10-CM | POA: Insufficient documentation

## 2018-10-09 DIAGNOSIS — Z720 Tobacco use: Secondary | ICD-10-CM

## 2018-10-09 DIAGNOSIS — R232 Flushing: Secondary | ICD-10-CM | POA: Diagnosis not present

## 2018-10-09 DIAGNOSIS — K7030 Alcoholic cirrhosis of liver without ascites: Secondary | ICD-10-CM

## 2018-10-09 DIAGNOSIS — K2970 Gastritis, unspecified, without bleeding: Secondary | ICD-10-CM

## 2018-10-09 DIAGNOSIS — L308 Other specified dermatitis: Secondary | ICD-10-CM

## 2018-10-09 DIAGNOSIS — K2990 Gastroduodenitis, unspecified, without bleeding: Secondary | ICD-10-CM

## 2018-10-09 DIAGNOSIS — J438 Other emphysema: Secondary | ICD-10-CM

## 2018-10-09 HISTORY — DX: Dermatitis, unspecified: L30.9

## 2018-10-09 MED ORDER — NALTREXONE HCL 50 MG PO TABS
50.0000 mg | ORAL_TABLET | Freq: Every day | ORAL | 3 refills | Status: DC
Start: 2018-10-09 — End: 2019-01-13

## 2018-10-09 MED ORDER — TRIAMCINOLONE ACETONIDE 0.1 % EX CREA: 1.0000 "application " | TOPICAL_CREAM | Freq: Two times a day (BID) | CUTANEOUS | 1 refills | Status: DC

## 2018-10-09 MED ORDER — CLONIDINE HCL 0.1 MG PO TABS: 0.1000 mg | ORAL_TABLET | Freq: Every day | ORAL | 3 refills | Status: DC

## 2018-10-09 MED ORDER — ALBUTEROL SULFATE HFA 108 (90 BASE) MCG/ACT IN AERS: 1.0000 | INHALATION_SPRAY | Freq: Four times a day (QID) | RESPIRATORY_TRACT | 3 refills | Status: DC | PRN

## 2018-10-09 MED ORDER — MOMETASONE FURO-FORMOTEROL FUM 200-5 MCG/ACT IN AERO: 2.0000 | INHALATION_SPRAY | Freq: Two times a day (BID) | RESPIRATORY_TRACT | 3 refills | Status: DC

## 2018-10-09 MED ORDER — BUPROPION HCL ER (SR) 150 MG PO TB12: 150.0000 mg | ORAL_TABLET | Freq: Two times a day (BID) | ORAL | 3 refills | Status: DC

## 2018-10-09 MED ORDER — OMEPRAZOLE 20 MG PO CPDR: 40.0000 mg | DELAYED_RELEASE_CAPSULE | Freq: Every day | ORAL | 2 refills | Status: DC

## 2018-10-09 NOTE — Progress Notes (Signed)
Subjective:  Patient ID: Felicia Perkins, female    DOB: 1976/03/01  Age: 43 y.o. MRN: [id number]  CC: COPD   HPI Felicia Perkins  is a 43 year old female with a history of alcoholic liver cirrhosis, COPD, alcohol abuse who presents today for follow-up visit. She was denied admission into inpatient alcohol rehab she informs me and has been trying to quit on her own.  Currently drinks 2 bottles of 16 ounces of beer every day which usually happens at nighttime when she is interested in working on quitting. She has cut back on her cigarettes to about 2 to 3 cigarettes/day.  Her cirrhosis is stable and she denies pedal edema, abdominal pain. Denies COPD flares, chest pain, wheezing or dyspnea. She complains of hot flashes which happen on most nights of the week and this causes her to itch.  She does have eczematous lesions which have flared up on both elbows and is requesting a cream for this.  Past Medical History:  Diagnosis Date  . Alcohol abuse   . Anxiety   . Bipolar 1 disorder ([place])   . Cancer (HCC)    cervical cancer  . GERD (gastroesophageal reflux disease)   . Pancreatitis   . PTSD (post-traumatic stress disorder)     Past Surgical History:  Procedure Laterality Date  . ABDOMINAL HYSTERECTOMY  2004  . BIOPSY  01/02/2018   Procedure: BIOPSY;  Surgeon: Milus Banister, MD;  Location: [facility];  Service: Endoscopy;;  . ESOPHAGOGASTRODUODENOSCOPY (EGD) WITH PROPOFOL N/A 01/02/2018   Procedure: ESOPHAGOGASTRODUODENOSCOPY (EGD) WITH PROPOFOL;  Surgeon: Milus Banister, MD;  Location: [facility];  Service: Endoscopy;  Laterality: N/A;  . EUS N/A 01/02/2018   Procedure: UPPER ENDOSCOPIC ULTRASOUND (EUS) RADIAL;  Surgeon: Milus Banister, MD;  Location: [facility];  Service: Endoscopy;  Laterality: N/A;  . FOOT SURGERY    . OTHER SURGICAL HISTORY     ovarian surgery  . TUBAL LIGATION      Family History  Problem Relation Age of Onset  . Hypertension Mother   .  Breast cancer Mother   . Hypertension Father   . Heart attack Cousin 30       died in 50s of MI  . Colon cancer Neg Hx     No Known Allergies  Outpatient Medications Prior to Visit  Medication Sig Dispense Refill  . Multiple Vitamins-Calcium (ONE-A-DAY WOMENS PO) Take 1 tablet by mouth daily.    . nicotine (NICODERM CQ - DOSED IN MG/24 HOURS) 21 mg/24hr patch Place 1 patch (21 mg total) onto the skin daily. 28 patch 3  . albuterol (PROVENTIL HFA;VENTOLIN HFA) 108 (90 Base) MCG/ACT inhaler Inhale 1-2 puffs into the lungs every 6 (six) hours as needed for wheezing or shortness of breath. 1 Inhaler 3  . mometasone-formoterol (DULERA) 200-5 MCG/ACT AERO Inhale 2 puffs into the lungs 2 (two) times daily. 1 Inhaler 3  . omeprazole (PRILOSEC) 20 MG capsule Take 2 capsules (40 mg total) by mouth daily. 60 capsule 2  . hydrocortisone (ANUSOL-HC) 2.5 % rectal cream Place 1 application rectally at bedtime. Use in rectum for 7 days. 30 g 1  . ondansetron (ZOFRAN) 4 MG tablet Take 1 tablet (4 mg total) by mouth every 8 (eight) hours as needed for nausea or vomiting. (Patient not taking: Reported on 12/25/2017) 4 tablet 0  . dicyclomine (BENTYL) 20 MG tablet Take 1 tablet (20 mg total) by mouth 2 (two) times daily. (Patient not taking: Reported on  12/25/2017) 20 tablet 0   No facility-administered medications prior to visit.      ROS Review of Systems General: negative for fever, weight loss, appetite change Eyes: no visual symptoms. ENT: no ear symptoms, no sinus tenderness, no nasal congestion or sore throat. Neck: no pain  Respiratory: no wheezing, shortness of breath, cough Cardiovascular: no chest pain, no dyspnea on exertion, no pedal edema, no orthopnea. Gastrointestinal: no abdominal pain, no diarrhea, no constipation Genito-Urinary: no urinary frequency, no dysuria, no polyuria. Hematologic: no bruising Endocrine: no cold or heat intolerance Neurological: no headaches, no seizures, no  tremors Musculoskeletal: no joint pains, no joint swelling Skin: see hpi Psychological: no depression, no anxiety,    Objective:  BP (!) 142/90   Pulse 72   Temp 98.1 F (36.7 C) (Oral)   Ht 5' 4.5" (1.638 m)   Wt 113 lb 6.4 oz (51.4 kg)   SpO2 94%   BMI 19.16 kg/m   BP/Weight 10/09/2018 06/04/2018 8/65/7846  Systolic BP 962 952 841  Diastolic BP 90 80 75  Wt. (Lbs) 113.4 111.8 113  BMI 19.16 18.89 19.4  Some encounter information is confidential and restricted. Go to Review Flowsheets activity to see all data.      Physical Exam Constitutional: normal appearing,  Eyes: PERRLA HEENT: Head is atraumatic, normal sinuses, normal oropharynx, normal appearing tonsils and palate, tympanic membrane is normal bilaterally. Neck: normal range of motion, no thyromegaly, no JVD Cardiovascular: normal rate and rhythm, normal heart sounds, no murmurs, rub or gallop, no pedal edema Respiratory: Normal breath sounds, clear to auscultation bilaterally, no wheezes, no rales, no rhonchi Abdomen: soft, not tender to palpation, normal bowel sounds, no enlarged organs Musculoskeletal: Full ROM, no tenderness in joints Skin: Dry scaly rash on elbows bilaterally Neurological: alert, oriented x3, cranial nerves I-XII grossly intact , normal motor strength, normal sensation. Psychological: normal mood.   CMP Latest Ref Rng & Units 12/01/2017 11/20/2017 11/19/2017  Glucose 65 - 99 mg/dL 99 49(L) 86  BUN 6 - 20 mg/dL <5(L) <5(L) <5(L)  Creatinine 0.44 - 1.00 mg/dL 0.40(L) 0.66 0.58  Sodium 135 - 145 mmol/L 138 137 133(L)  Potassium 3.5 - 5.1 mmol/L 4.1 4.0 3.8  Chloride 101 - 111 mmol/L 104 104 97(L)  CO2 22 - 32 mmol/L 21(L) 17(L) 24  Calcium 8.9 - 10.3 mg/dL 9.5 8.6(L) 9.1  Total Protein 6.5 - 8.1 g/dL 7.3 5.5(L) 6.1(L)  Total Bilirubin 0.3 - 1.2 mg/dL 0.5 1.6(H) 1.4(H)  Alkaline Phos 38 - 126 U/L 163(H) 97 119  AST 15 - 41 U/L 38 31 40  ALT 14 - 54 U/L 36 22 28    Lipid Panel       Component Value Date/Time   TRIG 59 11/19/2017 0421    CBC    Component Value Date/Time   WBC 6.8 12/01/2017 1241   RBC 3.79 (L) 12/01/2017 1241   HGB 12.8 12/01/2017 1241   HGB 14.3 11/28/2016 1541   HCT 37.8 12/01/2017 1241   HCT 43.2 11/28/2016 1541   PLT 336 12/01/2017 1241   PLT 249 11/28/2016 1541   MCV 99.7 12/01/2017 1241   MCV 98 (H) 11/28/2016 1541   MCH 33.8 12/01/2017 1241   MCHC 33.9 12/01/2017 1241   RDW 13.2 12/01/2017 1241   RDW 13.7 11/28/2016 1541   LYMPHSABS 2.9 11/19/2017 0420   LYMPHSABS 3.4 (H) 11/28/2016 1541   MONOABS 0.8 11/19/2017 0420   EOSABS 0.1 11/19/2017 0420   EOSABS 0.3 11/28/2016  1541   BASOSABS 0.0 11/19/2017 0420   BASOSABS 0.2 11/28/2016 1541    No results found for: HGBA1C  Assessment & Plan:   1. Gastritis and gastroduodenitis Stable - CMP14+EGFR - CBC with Differential/Platelet - omeprazole (PRILOSEC) 20 MG capsule; Take 2 capsules (40 mg total) by mouth daily.  Dispense: 60 capsule; Refill: 2  2. Other emphysema ([place]) Controlled - mometasone-formoterol (DULERA) 200-5 MCG/ACT AERO; Inhale 2 puffs into the lungs 2 (two) times daily.  Dispense: 1 Inhaler; Refill: 3 - albuterol (PROVENTIL HFA;VENTOLIN HFA) 108 (90 Base) MCG/ACT inhaler; Inhale 1-2 puffs into the lungs every 6 (six) hours as needed for wheezing or shortness of breath.  Dispense: 1 Inhaler; Refill: 3  3. Alcohol abuse Still consuming 32 ounces of beer per day but is willing to work on quitting - naltrexone (DEPADE) 50 MG tablet; Take 1 tablet (50 mg total) by mouth daily.  Dispense: 30 tablet; Refill: 3  4. Hot flashes - cloNIDine (CATAPRES) 0.1 MG tablet; Take 1 tablet (0.1 mg total) by mouth at bedtime. For hot flashes  Dispense: 30 tablet; Refill: 3  5. Tobacco abuse Spent 3 minutes counseling on smoking cessation and she is willing to work on cutting back - buPROPion (WELLBUTRIN SR) 150 MG 12 hr tablet; Take 1 tablet (150 mg total) by mouth 2 (two) times  daily. For smoking cessation  Dispense: 60 tablet; Refill: 3  6. Other eczema - triamcinolone cream (KENALOG) 0.1 %; Apply 1 application topically 2 (two) times daily.  Dispense: 30 g; Refill: 1   7. Alcoholic cirrhosis of liver without ascites (HCC) Stable Discussed the need to quit alcohol   Meds ordered this encounter  Medications  . naltrexone (DEPADE) 50 MG tablet    Sig: Take 1 tablet (50 mg total) by mouth daily.    Dispense:  30 tablet    Refill:  3  . omeprazole (PRILOSEC) 20 MG capsule    Sig: Take 2 capsules (40 mg total) by mouth daily.    Dispense:  60 capsule    Refill:  2  . mometasone-formoterol (DULERA) 200-5 MCG/ACT AERO    Sig: Inhale 2 puffs into the lungs 2 (two) times daily.    Dispense:  1 Inhaler    Refill:  3  . albuterol (PROVENTIL HFA;VENTOLIN HFA) 108 (90 Base) MCG/ACT inhaler    Sig: Inhale 1-2 puffs into the lungs every 6 (six) hours as needed for wheezing or shortness of breath.    Dispense:  1 Inhaler    Refill:  3  . triamcinolone cream (KENALOG) 0.1 %    Sig: Apply 1 application topically 2 (two) times daily.    Dispense:  30 g    Refill:  1  . buPROPion (WELLBUTRIN SR) 150 MG 12 hr tablet    Sig: Take 1 tablet (150 mg total) by mouth 2 (two) times daily. For smoking cessation    Dispense:  60 tablet    Refill:  3  . DISCONTD: cloNIDine (CATAPRES) 0.1 MG tablet    Sig: Take 1 tablet (0.1 mg total) by mouth at bedtime. For hot flashes    Dispense:  90 tablet    Refill:  3  . cloNIDine (CATAPRES) 0.1 MG tablet    Sig: Take 1 tablet (0.1 mg total) by mouth at bedtime. For hot flashes    Dispense:  30 tablet    Refill:  3    Discontinue 90 tablets    Follow-up: Return in about 3 months (  around 01/07/2019) for Follow-up of chronic medical conditions.       Charlott Rakes, MD, FAAFP. [facility] and [place], [place]   10/09/2018, 10:04 AM

## 2018-10-09 NOTE — Patient Instructions (Signed)

## 2018-10-10 ENCOUNTER — Other Ambulatory Visit: Payer: Self-pay | Admitting: Family Medicine

## 2018-10-10 DIAGNOSIS — Z1239 Encounter for other screening for malignant neoplasm of breast: Secondary | ICD-10-CM

## 2018-10-10 LAB — CBC WITH DIFFERENTIAL/PLATELET
BASOS ABS: 0.1 10*3/uL (ref 0.0–0.2)
BASOS: 2 %
EOS (ABSOLUTE): 0.1 10*3/uL (ref 0.0–0.4)
Eos: 2 %
Hematocrit: 44.1 % (ref 34.0–46.6)
Hemoglobin: 15.2 g/dL (ref 11.1–15.9)
Immature Grans (Abs): 0 10*3/uL (ref 0.0–0.1)
Immature Granulocytes: 0 %
LYMPHS: 45 %
Lymphocytes Absolute: 2.7 10*3/uL (ref 0.7–3.1)
MCH: 34.3 pg — ABNORMAL HIGH (ref 26.6–33.0)
MCHC: 34.5 g/dL (ref 31.5–35.7)
MCV: 100 fL — AB (ref 79–97)
MONOS ABS: 0.6 10*3/uL (ref 0.1–0.9)
Monocytes: 10 %
NEUTROS PCT: 41 %
Neutrophils Absolute: 2.5 10*3/uL (ref 1.4–7.0)
PLATELETS: 185 10*3/uL (ref 150–450)
RBC: 4.43 x10E6/uL (ref 3.77–5.28)
RDW: 12.4 % (ref 11.7–15.4)
WBC: 6 10*3/uL (ref 3.4–10.8)

## 2018-10-10 LAB — CMP14+EGFR
ALK PHOS: 150 IU/L — AB (ref 39–117)
ALT: 41 IU/L — AB (ref 0–32)
AST: 85 IU/L — AB (ref 0–40)
Albumin/Globulin Ratio: 1.7 (ref 1.2–2.2)
Albumin: 5 g/dL — ABNORMAL HIGH (ref 3.8–4.8)
BILIRUBIN TOTAL: 0.7 mg/dL (ref 0.0–1.2)
BUN/Creatinine Ratio: 6 — ABNORMAL LOW (ref 9–23)
BUN: 3 mg/dL — ABNORMAL LOW (ref 6–24)
CHLORIDE: 95 mmol/L — AB (ref 96–106)
CO2: 24 mmol/L (ref 20–29)
Calcium: 10.3 mg/dL — ABNORMAL HIGH (ref 8.7–10.2)
Creatinine, Ser: 0.52 mg/dL — ABNORMAL LOW (ref 0.57–1.00)
GFR calc Af Amer: 135 mL/min/{1.73_m2} (ref >59)
GFR calc non Af Amer: 117 mL/min/{1.73_m2} (ref >59)
GLOBULIN, TOTAL: 3 g/dL (ref 1.5–4.5)
Glucose: 97 mg/dL (ref 65–99)
POTASSIUM: 4.7 mmol/L (ref 3.5–5.2)
Sodium: 137 mmol/L (ref 134–144)
Total Protein: 8 g/dL (ref 6.0–8.5)

## 2018-10-15 ENCOUNTER — Ambulatory Visit
Admission: RE | Admit: 2018-10-15 | Discharge: 2018-10-15 | Disposition: A | Discharge status: Home / Self Care | Payer: Medicaid Other | Source: Ambulatory Visit | Attending: Family Medicine | Admitting: Family Medicine

## 2018-10-15 DIAGNOSIS — Z1239 Encounter for other screening for malignant neoplasm of breast: Secondary | ICD-10-CM

## 2018-10-20 ENCOUNTER — Other Ambulatory Visit: Payer: Self-pay | Admitting: Family Medicine

## 2018-10-20 DIAGNOSIS — R928 Other abnormal and inconclusive findings on diagnostic imaging of breast: Secondary | ICD-10-CM

## 2018-10-23 ENCOUNTER — Other Ambulatory Visit: Payer: Self-pay

## 2018-10-27 ENCOUNTER — Ambulatory Visit
Admission: RE | Admit: 2018-10-27 | Discharge: 2018-10-27 | Disposition: A | Discharge status: Home / Self Care | Payer: Medicaid Other | Source: Ambulatory Visit | Attending: Family Medicine | Admitting: Family Medicine

## 2018-10-27 ENCOUNTER — Ambulatory Visit: Payer: Self-pay

## 2018-10-27 ENCOUNTER — Other Ambulatory Visit: Payer: Self-pay

## 2018-10-27 DIAGNOSIS — R928 Other abnormal and inconclusive findings on diagnostic imaging of breast: Secondary | ICD-10-CM

## 2018-11-30 ENCOUNTER — Encounter: Payer: Self-pay | Admitting: Family Medicine

## 2018-12-01 ENCOUNTER — Other Ambulatory Visit: Payer: Self-pay | Admitting: Family Medicine

## 2018-12-01 DIAGNOSIS — K2970 Gastritis, unspecified, without bleeding: Secondary | ICD-10-CM

## 2018-12-01 DIAGNOSIS — J438 Other emphysema: Secondary | ICD-10-CM

## 2018-12-01 DIAGNOSIS — K2990 Gastroduodenitis, unspecified, without bleeding: Secondary | ICD-10-CM

## 2018-12-01 DIAGNOSIS — Z720 Tobacco use: Secondary | ICD-10-CM

## 2018-12-01 DIAGNOSIS — R232 Flushing: Secondary | ICD-10-CM

## 2018-12-01 MED ORDER — OMEPRAZOLE 20 MG PO CPDR: 40.0000 mg | DELAYED_RELEASE_CAPSULE | Freq: Every day | ORAL | 3 refills | Status: DC

## 2018-12-01 MED ORDER — CLONIDINE HCL 0.1 MG PO TABS: 0.1000 mg | ORAL_TABLET | Freq: Every day | ORAL | 3 refills | Status: DC

## 2018-12-01 MED ORDER — BUPROPION HCL ER (SR) 150 MG PO TB12: 150.0000 mg | ORAL_TABLET | Freq: Two times a day (BID) | ORAL | 3 refills | Status: DC

## 2018-12-01 MED ORDER — MOMETASONE FURO-FORMOTEROL FUM 200-5 MCG/ACT IN AERO: 2.0000 | INHALATION_SPRAY | Freq: Two times a day (BID) | RESPIRATORY_TRACT | 3 refills | Status: DC

## 2018-12-01 MED ORDER — ALBUTEROL SULFATE HFA 108 (90 BASE) MCG/ACT IN AERS: 1.0000 | INHALATION_SPRAY | Freq: Four times a day (QID) | RESPIRATORY_TRACT | 3 refills | Status: DC | PRN

## 2018-12-02 ENCOUNTER — Encounter: Payer: Self-pay | Admitting: Family Medicine

## 2018-12-04 ENCOUNTER — Ambulatory Visit: Payer: Medicaid Other

## 2019-01-02 ENCOUNTER — Other Ambulatory Visit: Payer: Self-pay

## 2019-01-02 ENCOUNTER — Encounter (HOSPITAL_COMMUNITY): Payer: Self-pay | Admitting: Emergency Medicine

## 2019-01-02 ENCOUNTER — Ambulatory Visit (INDEPENDENT_AMBULATORY_CARE_PROVIDER_SITE_OTHER): Payer: Medicaid Other

## 2019-01-02 ENCOUNTER — Ambulatory Visit (HOSPITAL_COMMUNITY)
Admission: EM | Admit: 2019-01-02 | Discharge: 2019-01-02 | Disposition: A | Discharge status: Home / Self Care | Payer: Medicaid Other | Attending: Family Medicine | Admitting: Family Medicine

## 2019-01-02 DIAGNOSIS — K2900 Acute gastritis without bleeding: Secondary | ICD-10-CM

## 2019-01-02 LAB — CBC WITH DIFFERENTIAL/PLATELET
Abs Immature Granulocytes: 0.02 10*3/uL (ref 0.00–0.07)
Basophils Absolute: 0.1 10*3/uL (ref 0.0–0.1)
Basophils Relative: 1 %
Eosinophils Absolute: 0.1 10*3/uL (ref 0.0–0.5)
Eosinophils Relative: 2 %
HCT: 48.5 % — ABNORMAL HIGH (ref 36.0–46.0)
Hemoglobin: 16.9 g/dL — ABNORMAL HIGH (ref 12.0–15.0)
Immature Granulocytes: 0 %
Lymphocytes Relative: 45 %
Lymphs Abs: 2.6 10*3/uL (ref 0.7–4.0)
MCH: 33.4 pg (ref 26.0–34.0)
MCHC: 34.8 g/dL (ref 30.0–36.0)
MCV: 95.8 fL (ref 80.0–100.0)
Monocytes Absolute: 0.6 10*3/uL (ref 0.1–1.0)
Monocytes Relative: 9 %
Neutro Abs: 2.6 10*3/uL (ref 1.7–7.7)
Neutrophils Relative %: 43 %
Platelets: 145 10*3/uL — ABNORMAL LOW (ref 150–400)
RBC: 5.06 MIL/uL (ref 3.87–5.11)
RDW: 12.8 % (ref 11.5–15.5)
WBC: 6 10*3/uL (ref 4.0–10.5)
nRBC: 0 % (ref 0.0–0.2)

## 2019-01-02 LAB — COMPREHENSIVE METABOLIC PANEL
ALT: 45 U/L — ABNORMAL HIGH (ref 0–44)
AST: 92 U/L — ABNORMAL HIGH (ref 15–41)
Albumin: 4.3 g/dL (ref 3.5–5.0)
Alkaline Phosphatase: 125 U/L (ref 38–126)
Anion gap: 17 — ABNORMAL HIGH (ref 5–15)
BUN: <5 mg/dL — ABNORMAL LOW (ref 6–20)
CO2: 23 mmol/L (ref 22–32)
Calcium: 10.2 mg/dL (ref 8.9–10.3)
Chloride: 94 mmol/L — ABNORMAL LOW (ref 98–111)
Creatinine, Ser: 0.61 mg/dL (ref 0.44–1.00)
GFR calc Af Amer: >60 mL/min (ref >60)
GFR calc non Af Amer: >60 mL/min (ref >60)
Glucose, Bld: 92 mg/dL (ref 70–99)
Potassium: 3.7 mmol/L (ref 3.5–5.1)
Sodium: 134 mmol/L — ABNORMAL LOW (ref 135–145)
Total Bilirubin: 1.1 mg/dL (ref 0.3–1.2)
Total Protein: 8 g/dL (ref 6.5–8.1)

## 2019-01-02 LAB — AMYLASE: Amylase: 69 U/L (ref 28–100)

## 2019-01-02 LAB — LIPASE, BLOOD: Lipase: 47 U/L (ref 11–51)

## 2019-01-02 MED ORDER — ALUMINUM & MAGNESIUM HYDROXIDE 200-200 MG/5ML PO SUSP: 5.0000 mL | Freq: Four times a day (QID) | ORAL | 0 refills | Status: DC | PRN

## 2019-01-02 MED ORDER — PANTOPRAZOLE SODIUM 20 MG PO TBEC: 20.0000 mg | DELAYED_RELEASE_TABLET | Freq: Every day | ORAL | 1 refills | Status: DC

## 2019-01-02 MED ORDER — TRAMADOL HCL 50 MG PO TABS: 50.0000 mg | ORAL_TABLET | Freq: Four times a day (QID) | ORAL | 0 refills | Status: AC | PRN

## 2019-01-02 NOTE — Discharge Instructions (Addendum)
Your labs were not concerning for pancreatitis We are treating you for gastritis Take the Protonix daily.  Mylanta also sent  Tramadol for severe pain If your symptoms worsen go to the ER.

## 2019-01-02 NOTE — ED Notes (Signed)
Patient able to ambulate independently  

## 2019-01-02 NOTE — ED Provider Notes (Signed)
[place]    CSN: [id number] Arrival date & time: 01/02/19  1104     History   Chief Complaint Chief Complaint  Patient presents with  . Abdominal Pain    HPI Felicia Perkins is a 43 y.o. female.   Pt is a 43 year old female with PMH of alcohol abuse, anxiety, bipolar, cancer, GERD, pancreatitis, PTSD. She presents with upper abdominal pain that has been present and worse over the past 4 days. She vomited all day Tuesday but has not vomited since. She has had some diarrhea. No vomiting since Tuesday. She has been drinking water but not eating. Admits to drinking heavily this past weekend. Repots this feels like previous pancreatitis flares. She hasn't had a normal BM in 5 days and has been straining. Some blood with wiping.  She hasn't taken anything for the pain. No fever, chills, body aches.  No recent sick contacts or recent traveling.  ROS per HPI      Past Medical History:  Diagnosis Date  . Alcohol abuse   . Anxiety   . Bipolar 1 disorder ([place])   . Cancer (HCC)    cervical cancer  . GERD (gastroesophageal reflux disease)   . Pancreatitis   . PTSD (post-traumatic stress disorder)     Patient Active Problem List   Diagnosis Date Noted  . Eczema 10/09/2018  . Acute pancreatitis   . Alcohol induced acute pancreatitis 11/22/2017  . Duodenitis 11/22/2017  . Tobacco abuse counseling   . 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 Nondisplaced fracture of neck of fifth metacarpal bone, right hand, initial encounter for closed fracture 08/26/2017  . Alcoholic cirrhosis of liver without ascites ([place]) 10/15/2016  . Alcohol abuse 09/04/2016  . Gastritis and gastroduodenitis 09/04/2016  . Tobacco abuse 09/04/2016  . COPD (chronic obstructive pulmonary disease) ([place]) 09/04/2016  . Elevated liver enzymes 12/22/2014  . Abdominal pain, epigastric 12/16/2014  . Chest pain with moderate risk for cardiac etiology 12/15/2014  . Alcohol dependence ([place]) 12/16/2012   Class: Acute    Past Surgical History:  Procedure Laterality Date  . ABDOMINAL HYSTERECTOMY  2004  . BIOPSY  01/02/2018   Procedure: BIOPSY;  Surgeon: Milus Banister, MD;  Location: [facility];  Service: Endoscopy;;  . ESOPHAGOGASTRODUODENOSCOPY (EGD) WITH PROPOFOL N/A 01/02/2018   Procedure: ESOPHAGOGASTRODUODENOSCOPY (EGD) WITH PROPOFOL;  Surgeon: Milus Banister, MD;  Location: [facility];  Service: Endoscopy;  Laterality: N/A;  . EUS N/A 01/02/2018   Procedure: UPPER ENDOSCOPIC ULTRASOUND (EUS) RADIAL;  Surgeon: Milus Banister, MD;  Location: [facility];  Service: Endoscopy;  Laterality: N/A;  . FOOT SURGERY    . OTHER SURGICAL HISTORY     ovarian surgery  . TUBAL LIGATION      OB History   No obstetric history on file.      Home Medications    Prior to Admission medications   Medication Sig Start Date End Date Taking? Authorizing Provider  buPROPion (WELLBUTRIN SR) 150 MG 12 hr tablet Take 1 tablet (150 mg total) by mouth 2 (two) times daily. For smoking cessation 12/01/18  Yes Charlott Rakes, MD  cloNIDine (CATAPRES) 0.1 MG tablet Take 1 tablet (0.1 mg total) by mouth at bedtime. For hot flashes 12/01/18  Yes Newlin, Charlane Ferretti, MD  mometasone-formoterol (DULERA) 200-5 MCG/ACT AERO Inhale 2 puffs into the lungs 2 (two) times daily. 12/01/18  Yes Charlott Rakes, MD  Multiple Vitamins-Calcium (ONE-A-DAY WOMENS PO) Take 1 tablet by mouth daily.   Yes [provider]  naltrexone (DEPADE) 50 MG tablet Take 1 tablet (50 mg total) by mouth daily. 10/09/18  Yes Charlott Rakes, MD  albuterol (VENTOLIN HFA) 108 (90 Base) MCG/ACT inhaler Inhale 1-2 puffs into the lungs every 6 (six) hours as needed for wheezing or shortness of breath. 12/01/18   Charlott Rakes, MD  aluminum-magnesium hydroxide 200-200 MG/5ML suspension Take 5 mLs by mouth every 6 (six) hours as needed for indigestion. 01/02/19   Loura Halt A, NP  hydrocortisone (ANUSOL-HC) 2.5 % rectal cream Place 1  application rectally at bedtime. Use in rectum for 7 days. 12/11/17   Willia Craze, NP  nicotine (NICODERM CQ - DOSED IN MG/24 HOURS) 21 mg/24hr patch Place 1 patch (21 mg total) onto the skin daily. 06/04/18   Charlott Rakes, MD  omeprazole (PRILOSEC) 20 MG capsule Take 2 capsules (40 mg total) by mouth daily. 12/01/18   Charlott Rakes, MD  ondansetron (ZOFRAN) 4 MG tablet Take 1 tablet (4 mg total) by mouth every 8 (eight) hours as needed for nausea or vomiting. Patient not taking: Reported on 12/25/2017 12/01/17   Jillyn Ledger, PA-C  pantoprazole (PROTONIX) 20 MG tablet Take 1 tablet (20 mg total) by mouth daily. 01/02/19   Loura Halt A, NP  traMADol (ULTRAM) 50 MG tablet Take 1 tablet (50 mg total) by mouth every 6 (six) hours as needed for up to 3 days. 01/02/19 01/05/19  Loura Halt A, NP  triamcinolone cream (KENALOG) 0.1 % Apply 1 application topically 2 (two) times daily. 10/09/18   Charlott Rakes, MD    Family History Family History  Problem Relation Age of Onset  . Hypertension Mother   . Breast cancer Mother 38  . Hypertension Father   . Heart attack Cousin 30       died in 68s of MI  . Breast cancer Maternal Aunt   . Breast cancer Maternal Aunt   . Colon cancer Neg Hx     Social History Social History   Tobacco Use  . Smoking status: Current Every Day Smoker    Packs/day: 0.50    Types: Cigarettes  . Smokeless tobacco: Never Used  . Tobacco comment: 2-3 cigarettes daily  Substance Use Topics  . Alcohol use: Not Currently    Comment: no alchol for the past 3 weeks  . Drug use: No     Allergies   Patient has no known allergies.   Review of Systems Review of Systems   Physical Exam Triage Vital Signs ED Triage Vitals  Enc Vitals Group     BP 01/02/19 1118 (!) 165/100     Pulse Rate 01/02/19 1118 83     Resp 01/02/19 1118 18     Temp 01/02/19 1118 98 F (36.7 C)     Temp Source 01/02/19 1118 Oral     SpO2 01/02/19 1118 97 %     Weight --       Height --      Head Circumference --      Peak Flow --      Pain Score 01/02/19 1117 8     Pain Loc --      Pain Edu? --      Excl. in [place]? --    No data found.  Updated Vital Signs BP (!) 165/100 (BP Location: Right Arm)   Pulse 83   Temp 98 F (36.7 C) (Oral)   Resp 18   SpO2 97%   Visual Acuity Right Eye  Distance:   Left Eye Distance:   Bilateral Distance:    Right Eye Near:   Left Eye Near:    Bilateral Near:     Physical Exam Vitals signs and nursing note reviewed.  Constitutional:      General: She is not in acute distress.    Appearance: She is well-developed. She is not toxic-appearing.     Comments: Appears in pain   HENT:     Head: Normocephalic and atraumatic.  Pulmonary:     Effort: Pulmonary effort is normal.  Abdominal:     General: Abdomen is flat. Bowel sounds are normal. There is no distension or abdominal bruit.     Palpations: Abdomen is soft. There is no shifting dullness, fluid wave, hepatomegaly, splenomegaly, mass or pulsatile mass.     Tenderness: There is abdominal tenderness in the right upper quadrant, epigastric area and left upper quadrant. There is guarding. There is no right CVA tenderness, left CVA tenderness or rebound.     Hernia: No hernia is present.  Skin:    General: Skin is warm and dry.  Neurological:     Mental Status: She is alert.  Psychiatric:        Mood and Affect: Mood normal.      UC Treatments / Results  Labs (all labs ordered are listed, but only abnormal results are displayed) Labs Reviewed  CBC WITH DIFFERENTIAL/PLATELET - Abnormal; Notable for the following components:      Result Value   Hemoglobin 16.9 (*)    HCT 48.5 (*)    Platelets 145 (*)    All other components within normal limits  COMPREHENSIVE METABOLIC PANEL - Abnormal; Notable for the following components:   Sodium 134 (*)    Chloride 94 (*)    BUN <5 (*)    AST 92 (*)    ALT 45 (*)    Anion gap 17 (*)    All other components within  normal limits  LIPASE, BLOOD  AMYLASE    EKG None  Radiology No results found.  Procedures Procedures (including critical care time)  Medications Ordered in UC Medications - No data to display  Initial Impression / Assessment and Plan / UC Course  I have reviewed the triage vital signs and the nursing notes.  Pertinent labs & imaging results that were available during my care of the patient were reviewed by me and considered in my medical decision making (see chart for details).     Patient is a 43 year old female with past medical history of pancreatitis. Symptoms and exam consistent with this today.  We will get some lab work. I am also doing a 2 view abdomen to rule out bowel obstruction based on constipation and symptoms.  X ray negative for constipation and obstruction.  Lab work mostly benign besides elevated liver enzymes. Pt has hx of same.  Lipase and amylase normal.   Mostly likely dx is gastritis. Pt has not been taking her omeprazole.  Will have her start Protonix  along with Mylanta Tramadol as needed for severe pain recommended no alcohol.   Pt instructed for continued or worse symptoms to go to the ER     Final Clinical Impressions(s) / UC Diagnoses   Final diagnoses:  Acute gastritis without hemorrhage, unspecified gastritis type     Discharge Instructions     Your labs were not concerning for pancreatitis We are treating you for gastritis Take the Protonix daily.  Mylanta also sent  Tramadol  for severe pain If your symptoms worsen go to the ER.     ED Prescriptions    Medication Sig Dispense Auth. Provider   pantoprazole (PROTONIX) 20 MG tablet Take 1 tablet (20 mg total) by mouth daily. 30 tablet Tareka Jhaveri A, NP   aluminum-magnesium hydroxide 200-200 MG/5ML suspension Take 5 mLs by mouth every 6 (six) hours as needed for indigestion. 355 mL Charne Mcbrien A, NP   traMADol (ULTRAM) 50 MG tablet Take 1 tablet (50 mg total) by mouth every  6 (six) hours as needed for up to 3 days. 12 tablet Loura Halt A, NP     Controlled Substance Prescriptions [place] Controlled Substance Registry consulted? Not Applicable   Orvan July, NP 01/05/19 1100

## 2019-01-02 NOTE — ED Triage Notes (Signed)
Pt presents to [facility] for assessment of epigastric pain starting Tuesday of this week.  Pt states she vomited all day Tuesday and pain has not subsiding since then.  Hx of pancreatitis, states it feels the same.  Pt states she has been having some diarrhea, and a feeling of needing to have a bowel movement with no evacuation.

## 2019-01-13 ENCOUNTER — Ambulatory Visit: Payer: Medicaid Other | Attending: Family Medicine | Admitting: Family Medicine

## 2019-01-13 ENCOUNTER — Other Ambulatory Visit: Payer: Self-pay

## 2019-01-13 ENCOUNTER — Encounter: Payer: Self-pay | Admitting: Family Medicine

## 2019-01-13 DIAGNOSIS — F1010 Alcohol abuse, uncomplicated: Secondary | ICD-10-CM | POA: Insufficient documentation

## 2019-01-13 DIAGNOSIS — Z79899 Other long term (current) drug therapy: Secondary | ICD-10-CM | POA: Diagnosis not present

## 2019-01-13 DIAGNOSIS — K7030 Alcoholic cirrhosis of liver without ascites: Secondary | ICD-10-CM | POA: Diagnosis not present

## 2019-01-13 DIAGNOSIS — R232 Flushing: Secondary | ICD-10-CM | POA: Diagnosis not present

## 2019-01-13 DIAGNOSIS — F17210 Nicotine dependence, cigarettes, uncomplicated: Secondary | ICD-10-CM | POA: Insufficient documentation

## 2019-01-13 DIAGNOSIS — Z720 Tobacco use: Secondary | ICD-10-CM

## 2019-01-13 DIAGNOSIS — N951 Menopausal and female climacteric states: Secondary | ICD-10-CM | POA: Insufficient documentation

## 2019-01-13 DIAGNOSIS — J438 Other emphysema: Secondary | ICD-10-CM | POA: Diagnosis not present

## 2019-01-13 DIAGNOSIS — F4310 Post-traumatic stress disorder, unspecified: Secondary | ICD-10-CM | POA: Insufficient documentation

## 2019-01-13 DIAGNOSIS — K219 Gastro-esophageal reflux disease without esophagitis: Secondary | ICD-10-CM | POA: Diagnosis not present

## 2019-01-13 DIAGNOSIS — F319 Bipolar disorder, unspecified: Secondary | ICD-10-CM | POA: Insufficient documentation

## 2019-01-13 MED ORDER — NALTREXONE HCL 50 MG PO TABS: 50.0000 mg | ORAL_TABLET | Freq: Every day | ORAL | 3 refills | Status: DC

## 2019-01-13 MED ORDER — CLONIDINE HCL 0.1 MG PO TABS: 0.1000 mg | ORAL_TABLET | Freq: Every day | ORAL | 3 refills | Status: DC

## 2019-01-13 MED ORDER — BUPROPION HCL ER (SR) 150 MG PO TB12: 150.0000 mg | ORAL_TABLET | Freq: Two times a day (BID) | ORAL | 3 refills | Status: DC

## 2019-01-13 MED ORDER — ALBUTEROL SULFATE HFA 108 (90 BASE) MCG/ACT IN AERS: 1.0000 | INHALATION_SPRAY | Freq: Four times a day (QID) | RESPIRATORY_TRACT | 3 refills | Status: DC | PRN

## 2019-01-13 MED ORDER — MOMETASONE FURO-FORMOTEROL FUM 200-5 MCG/ACT IN AERO: 2.0000 | INHALATION_SPRAY | Freq: Two times a day (BID) | RESPIRATORY_TRACT | 3 refills | Status: DC

## 2019-01-13 MED ORDER — PANTOPRAZOLE SODIUM 40 MG PO TBEC: 40.0000 mg | DELAYED_RELEASE_TABLET | Freq: Every day | ORAL | 3 refills | Status: DC

## 2019-01-13 NOTE — Progress Notes (Signed)
Patient has been called and DOB has been verified. Patient has been screened and transferred to PCP to start phone visit.  Refills:naltrexone

## 2019-01-13 NOTE — Progress Notes (Signed)
Virtual Visit via Telephone Note  I connected with Felicia Perkins, on 01/13/2019 at 2:55 PM by telephone due to the COVID-19 pandemic and verified that I am speaking with the correct person using two identifiers.   Consent: I discussed the limitations, risks, security and privacy concerns of performing an evaluation and management service by telephone and the availability of in person appointments. I also discussed with the patient that there may be a patient responsible charge related to this service. The patient expressed understanding and agreed to proceed.   Location of Patient: Home  Location of Provider: Clinic   Persons participating in Telemedicine visit: Liddie M Kinzey Sheriff Farrington-CMA Dr. Felecia Shelling     History of Present Illness: Felicia Perkins  is a 43 year old female with a history of alcoholic liver cirrhosis, COPD, alcohol abuse, tobacco abuse who presents today for a follow-up visit. She had an ED visit on 01/02/2019 where she was diagnosed with gastritis and prescribed pantoprazole but she informs me she is having to take 2 or 3 of the 20 mg capsules for relief of abdominal symptoms. Last weekend was her sister's birthday and she indulged in a lot of alcohol.  Informs me she was doing better while on naltrexone which helped reduce the craving for alcohol but she has run out at this time. Denies flareup of her liver cirrhosis with no pedal edema, dyspnea or abdominal swelling.  Her asthma is controlled but usually flareups in the summer when she mows the lawn. She currently smokes 2 to 3 cigarettes/day. Currently does not take medications for her bipolar disorder and was previously followed by mental health at [facility] however she never followed through with them.   Past Medical History:  Diagnosis Date  . Alcohol abuse   . Anxiety   . Bipolar 1 disorder ([place])   . Cancer (HCC)    cervical cancer  . GERD (gastroesophageal reflux disease)   .  Pancreatitis   . PTSD (post-traumatic stress disorder)    No Known Allergies  Current Outpatient Medications on File Prior to Visit  Medication Sig Dispense Refill  . albuterol (VENTOLIN HFA) 108 (90 Base) MCG/ACT inhaler Inhale 1-2 puffs into the lungs every 6 (six) hours as needed for wheezing or shortness of breath. 1 Inhaler 3  . aluminum-magnesium hydroxide 200-200 MG/5ML suspension Take 5 mLs by mouth every 6 (six) hours as needed for indigestion. 355 mL 0  . buPROPion (WELLBUTRIN SR) 150 MG 12 hr tablet Take 1 tablet (150 mg total) by mouth 2 (two) times daily. For smoking cessation 60 tablet 3  . cloNIDine (CATAPRES) 0.1 MG tablet Take 1 tablet (0.1 mg total) by mouth at bedtime. For hot flashes 30 tablet 3  . mometasone-formoterol (DULERA) 200-5 MCG/ACT AERO Inhale 2 puffs into the lungs 2 (two) times daily. 1 Inhaler 3  . Multiple Vitamins-Calcium (ONE-A-DAY WOMENS PO) Take 1 tablet by mouth daily.    . naltrexone (DEPADE) 50 MG tablet Take 1 tablet (50 mg total) by mouth daily. 30 tablet 3  . nicotine (NICODERM CQ - DOSED IN MG/24 HOURS) 21 mg/24hr patch Place 1 patch (21 mg total) onto the skin daily. 28 patch 3  . pantoprazole (PROTONIX) 20 MG tablet Take 1 tablet (20 mg total) by mouth daily. 30 tablet 1  . triamcinolone cream (KENALOG) 0.1 % Apply 1 application topically 2 (two) times daily. 30 g 1  . hydrocortisone (ANUSOL-HC) 2.5 % rectal cream Place 1 application rectally at bedtime. Use in  rectum for 7 days. 30 g 1  . omeprazole (PRILOSEC) 20 MG capsule Take 2 capsules (40 mg total) by mouth daily. (Patient not taking: Reported on 01/13/2019) 60 capsule 3  . ondansetron (ZOFRAN) 4 MG tablet Take 1 tablet (4 mg total) by mouth every 8 (eight) hours as needed for nausea or vomiting. (Patient not taking: Reported on 12/25/2017) 4 tablet 0   No current facility-administered medications on file prior to visit.     Observations/Objective: Awake, alert, oriented x3 Not in acute  distress  Assessment and Plan: 1. Alcohol abuse Ongoing alcohol abuse We will refill naltrexone as she did better on this Counseled against alcohol abuse and discussed measures to assist in quitting - pantoprazole (PROTONIX) 40 MG tablet; Take 1 tablet (40 mg total) by mouth daily.  Dispense: 30 tablet; Refill: 3 - naltrexone (DEPADE) 50 MG tablet; Take 1 tablet (50 mg total) by mouth daily.  Dispense: 30 tablet; Refill: 3  2. Other emphysema (HCC) Stable No recent exacerbation - mometasone-formoterol (DULERA) 200-5 MCG/ACT AERO; Inhale 2 puffs into the lungs 2 (two) times daily.  Dispense: 1 Inhaler; Refill: 3 - albuterol (VENTOLIN HFA) 108 (90 Base) MCG/ACT inhaler; Inhale 1-2 puffs into the lungs every 6 (six) hours as needed for wheezing or shortness of breath.  Dispense: 1 Inhaler; Refill: 3  3. Hot flashes Controlled - cloNIDine (CATAPRES) 0.1 MG tablet; Take 1 tablet (0.1 mg total) by mouth at bedtime. For hot flashes  Dispense: 30 tablet; Refill: 3  4. Tobacco abuse Spent 3 minutes counseling on hazardous effect of smoking and the need to quit She is currently on Wellbutrin - buPROPion (WELLBUTRIN SR) 150 MG 12 hr tablet; Take 1 tablet (150 mg total) by mouth 2 (two) times daily. For smoking cessation  Dispense: 60 tablet; Refill: 3  5. Alcoholic cirrhosis of liver without ascites (HCC) Stable No ascites or pedal edema See #1 above - pantoprazole (PROTONIX) 40 MG tablet; Take 1 tablet (40 mg total) by mouth daily.  Dispense: 30 tablet; Refill: 3  6. Bipolar 1 disorder ([place]) Currently not on medication Previously followed by [facility] and advised to call for an appointment   Follow Up Instructions: 3 months   I discussed the assessment and treatment plan with the patient. The patient was provided an opportunity to ask questions and all were answered. The patient agreed with the plan and demonstrated an understanding of the instructions.   The patient was advised to call  back or seek an in-person evaluation if the symptoms worsen or if the condition fails to improve as anticipated.     I provided 17 minutes total of non-face-to-face time during this encounter including median intraservice time, reviewing previous notes, labs, imaging, medications, management and patient verbalized understanding.     Charlott Rakes, MD, FAAFP. [facility] and [facility], [place]   01/13/2019, 2:55 PM

## 2019-01-13 NOTE — Patient Instructions (Signed)
Alcohol Intoxication Alcohol intoxication occurs when a person no longer thinks clearly or functions well (becomes impaired) after drinking alcohol. Intoxication can occur with just one drink. The legal definition of alcohol intoxication depends on the amount of alcohol in the blood (blood alcohol concentration, BAC). BAC of 80-100 mg/dL or higher is commonly considered legally intoxicated. The level of impairment depends on:  The amount of alcohol the person had.  The person's age, gender, and weight.  How often the person drinks.  Whether the person has other medical conditions, such as diabetes, seizures, or a heart condition. Alcohol intoxication can range from mild to severe. The condition can be dangerous, especially if the person:  Also took certain drugs or prescription medicines.  Drinks a large amount of alcohol in a short period of time (binge drinks). ? For women, binge drinking is having four or more drinks at one time. ? For men, binge drinking is having five or more drinks at one time. If you or anyone around you appears intoxicated, speak up and act. What are the causes? This condition is caused by drinking alcohol. What increases the risk? The following factors may make you more likely to develop this condition:  Peer pressure in young adults.  Difficulty managing stress.  History of drug or alcohol abuse.  Combining alcohol with drugs.  Family history of drug or alcohol abuse.  Low body weight.  Binge drinking. What are the signs or symptoms? Symptoms of alcohol intoxication can vary from person to person. Symptoms can be mild, moderate, or severe. Symptoms of mild alcohol intoxication may include:  Feeling relaxed or sleepy.  Having mild difficulty with coordination, speech, memory, or attention. Symptoms of moderate alcohol intoxication may include:  Extreme emotions, like anger or sadness.  Moderate difficulty with coordination, speech, memory, or  attention. Symptoms of severe alcohol intoxication may include:  Severe difficulty with coordination, speech, memory, or attention.  Passing out.  Vomiting.  Confusion.  Slow breathing.  Coma. Intoxication can change quickly from mild to severe. It can cause coma or death, especially in people who are not exposed to alcohol often. How is this diagnosed? Your health care provider will ask you how much alcohol you drank and what kind you had. Intoxication may also be diagnosed based on:  Your symptoms and medical history.  A physical exam.  A blood test that measures BAC.  A smell of alcohol on your breath. How is this treated? Treatment for alcohol intoxication may include:  Being monitored in an emergency department, hospital, or treatment center until your [facility] comes down and it is safe for you to go home.  IV fluids to prevent or treat loss of fluid in the body (dehydration).  Medicine to treat nausea or vomiting or to get rid of alcohol in the body.  Counseling (brief intervention) about the dangers of using alcohol.  Treatment for substance use disorder.  Oxygen therapy or a breathing machine (ventilator). Long-term (chronic) exposure to alcohol can have long-term effects on your brain, heart, and gastrointestinal system. These effects can be serious and may also require treatment. Follow these instructions at home:  Eating and drinking   Do not drink alcohol if: ? Your health care provider tells you not to drink. ? You are pregnant, may be pregnant, or are planning to become pregnant. ? You are under the legal drinking age (44 years old in the [place] ? You are taking medicines that should not be taken with alcohol. ? You  have a medical condition, and alcohol makes it worse. ? You need to drive or perform activities that require you to be alert. ? You have substance use disorder.  Ask your health care provider if alcohol is safe for you. If your health care  provider allows you to drink alcohol, limit how much you have. You may drink: ? 0-1 drink a day for women. ? 0-2 drinks a day for men.  Be aware of how much alcohol is in your drink. In the [place] one drink equals one 12 oz bottle of beer (355 mL), one 5 oz glass of wine (148 mL), or one 1 oz shot of hard liquor (44 mL).  Avoid drinking alcohol on an empty stomach.  Stay hydrated. Drink enough fluid to keep your urine pale yellow. Avoid caffeine because it can dehydrate you.  Avoid drinking more than one drink per hour.  When having multiple drinks, drink water or a non-alcoholic beverage between alcoholic drinks. General instructions  Take over-the-counter and prescription medicines only as told by your health care provider.  Do not drive after drinking any amount of alcohol. Plan for a designated driver or another way to go home.  Have someone responsible stay with you while you are intoxicated. You should not be left alone.  Keep all follow-up visits as told by your health care provider. This is important. Contact a health care provider if:  You do not feel better after a few days.  You have problems at work, at school, or at home due to drinking. Get help right away if:  You have any of the following: ? Moderate to severe trouble with coordination, speech, memory, or attention. ? Trouble staying awake. ? Severe confusion. ? A seizure. ? Light-headedness. ? Fainting. ? Vomiting bright red blood or material that looks like coffee grounds. ? Bloody stool (feces). The blood may make your stool bright red, black, or tarry. It may also smell bad. ? Shakiness when trying to stop drinking. ? Thoughts about hurting yourself or others. If you ever feel like you may hurt yourself or others, or have thoughts about taking your own life, get help right away. You can go to your nearest emergency department or call:  Your local emergency services (911 in the [place]  A suicide crisis  helpline, such as the [place] at 207-278-8337. This is open 24 hours a day. Summary  Alcohol intoxication occurs when a person no longer thinks clearly or functions well after drinking alcohol.  If your health care provider says that alcohol is safe for you, limit alcohol intake to no more than 1 drink a day for women (no drinks if you are pregnant) and 2 drinks a day for men. One drink equals 12 oz of beer, 5 oz of wine, or 1 oz of hard liquor.  Contact your health care provider if drinking has caused you problems at work, school, or home.  Get help right away if you have thoughts about hurting yourself or others. This information is not intended to replace advice given to you by your health care provider. Make sure you discuss any questions you have with your health care provider. Document Released: 05/09/2005 Document Revised: 11/19/2017 Document Reviewed: 11/19/2017 Elsevier Interactive Patient Education  2019 [employer].

## 2019-03-01 ENCOUNTER — Encounter (HOSPITAL_COMMUNITY): Payer: Self-pay | Admitting: Emergency Medicine

## 2019-03-01 ENCOUNTER — Emergency Department (HOSPITAL_COMMUNITY): Payer: Medicaid Other

## 2019-03-01 ENCOUNTER — Encounter (HOSPITAL_COMMUNITY): Payer: Self-pay | Admitting: Pharmacy Technician

## 2019-03-01 ENCOUNTER — Emergency Department (HOSPITAL_COMMUNITY)
Admission: EM | Admit: 2019-03-01 | Discharge: 2019-03-01 | Disposition: A | Discharge status: Home / Self Care | Payer: Medicaid Other | Attending: Emergency Medicine | Admitting: Emergency Medicine

## 2019-03-01 ENCOUNTER — Other Ambulatory Visit: Payer: Self-pay

## 2019-03-01 ENCOUNTER — Ambulatory Visit (HOSPITAL_COMMUNITY)
Admission: EM | Admit: 2019-03-01 | Discharge: 2019-03-01 | Disposition: A | Discharge status: Home / Self Care | Payer: Medicaid Other | Attending: Family Medicine | Admitting: Family Medicine

## 2019-03-01 DIAGNOSIS — K7030 Alcoholic cirrhosis of liver without ascites: Secondary | ICD-10-CM | POA: Insufficient documentation

## 2019-03-01 DIAGNOSIS — Z8541 Personal history of malignant neoplasm of cervix uteri: Secondary | ICD-10-CM | POA: Diagnosis not present

## 2019-03-01 DIAGNOSIS — R079 Chest pain, unspecified: Secondary | ICD-10-CM | POA: Diagnosis not present

## 2019-03-01 DIAGNOSIS — R0789 Other chest pain: Secondary | ICD-10-CM

## 2019-03-01 DIAGNOSIS — Z79899 Other long term (current) drug therapy: Secondary | ICD-10-CM | POA: Insufficient documentation

## 2019-03-01 DIAGNOSIS — Z720 Tobacco use: Secondary | ICD-10-CM | POA: Diagnosis not present

## 2019-03-01 DIAGNOSIS — F1020 Alcohol dependence, uncomplicated: Secondary | ICD-10-CM | POA: Insufficient documentation

## 2019-03-01 DIAGNOSIS — F17210 Nicotine dependence, cigarettes, uncomplicated: Secondary | ICD-10-CM | POA: Insufficient documentation

## 2019-03-01 DIAGNOSIS — J449 Chronic obstructive pulmonary disease, unspecified: Secondary | ICD-10-CM | POA: Diagnosis not present

## 2019-03-01 LAB — TROPONIN I (HIGH SENSITIVITY)
Troponin I (High Sensitivity): 4 ng/L (ref <18)
Troponin I (High Sensitivity): 5 ng/L (ref <18)

## 2019-03-01 LAB — CBC WITH DIFFERENTIAL/PLATELET
Abs Immature Granulocytes: 0.03 10*3/uL (ref 0.00–0.07)
Basophils Absolute: 0.1 10*3/uL (ref 0.0–0.1)
Basophils Relative: 1 %
Eosinophils Absolute: 0.1 10*3/uL (ref 0.0–0.5)
Eosinophils Relative: 1 %
HCT: 45.1 % (ref 36.0–46.0)
Hemoglobin: 15.2 g/dL — ABNORMAL HIGH (ref 12.0–15.0)
Immature Granulocytes: 1 %
Lymphocytes Relative: 35 %
Lymphs Abs: 2.1 10*3/uL (ref 0.7–4.0)
MCH: 34.2 pg — ABNORMAL HIGH (ref 26.0–34.0)
MCHC: 33.7 g/dL (ref 30.0–36.0)
MCV: 101.3 fL — ABNORMAL HIGH (ref 80.0–100.0)
Monocytes Absolute: 0.7 10*3/uL (ref 0.1–1.0)
Monocytes Relative: 11 %
Neutro Abs: 3 10*3/uL (ref 1.7–7.7)
Neutrophils Relative %: 51 %
Platelets: 186 10*3/uL (ref 150–400)
RBC: 4.45 MIL/uL (ref 3.87–5.11)
RDW: 13.9 % (ref 11.5–15.5)
WBC: 5.9 10*3/uL (ref 4.0–10.5)
nRBC: 0 % (ref 0.0–0.2)

## 2019-03-01 LAB — BASIC METABOLIC PANEL
Anion gap: 17 — ABNORMAL HIGH (ref 5–15)
BUN: <5 mg/dL — ABNORMAL LOW (ref 6–20)
CO2: 20 mmol/L — ABNORMAL LOW (ref 22–32)
Calcium: 10.1 mg/dL (ref 8.9–10.3)
Chloride: 97 mmol/L — ABNORMAL LOW (ref 98–111)
Creatinine, Ser: 0.46 mg/dL (ref 0.44–1.00)
GFR calc Af Amer: >60 mL/min (ref >60)
GFR calc non Af Amer: >60 mL/min (ref >60)
Glucose, Bld: 112 mg/dL — ABNORMAL HIGH (ref 70–99)
Potassium: 3.3 mmol/L — ABNORMAL LOW (ref 3.5–5.1)
Sodium: 134 mmol/L — ABNORMAL LOW (ref 135–145)

## 2019-03-01 LAB — D-DIMER, QUANTITATIVE: D-Dimer, Quant: 0.46 ug/mL-FEU (ref 0.00–0.50)

## 2019-03-01 MED ORDER — KETOROLAC TROMETHAMINE 30 MG/ML IJ SOLN
30.0000 mg | Freq: Once | INTRAMUSCULAR | Status: AC
  Administered 2019-03-01: 30 mg via INTRAVENOUS
  Filled 2019-03-01: qty 1

## 2019-03-01 NOTE — Discharge Instructions (Addendum)
You were seen in the emergency department for chest pain.  You had blood work EKG and a chest x-ray that did not show any serious findings.  This pain is mostly related to your chest wall and should respond to ibuprofen and cold or warm compress locally to the area.  Please follow-up with your doctor and return if any worsening symptoms.

## 2019-03-01 NOTE — ED Notes (Signed)
Patient Alert and oriented to baseline. Stable and ambulatory to baseline. Patient verbalized understanding of the discharge instructions.  Patient belongings were taken by the patient.   

## 2019-03-01 NOTE — ED Notes (Signed)
Pt ambulated to RR .  

## 2019-03-01 NOTE — Discharge Instructions (Addendum)
Go to ER for evaluation of chest pain with EKG changes.

## 2019-03-01 NOTE — ED Provider Notes (Signed)
[facility] EMERGENCY DEPARTMENT Provider Note   CSN: [id number] Arrival date & time: 03/01/19  1132     History   Chief Complaint Chief Complaint  Patient presents with  . Chest Pain    HPI Felicia Perkins is a 43 y.o. female.  She has a history of cervical cancer and pancreatitis.  She is complained of acute central anterior chest pain sharp and stabbing while she was getting herself up out of bed this morning.  It is worse with any twisting or turning or moving her head.  It hurts worse when she takes a deep breath.  She does not feel short of breath.  She is never had this before.  She denies any trauma and is not associate with any diaphoresis nausea or vomiting.  She is tried nothing for this.     The history is provided by the patient.  Chest Pain Pain location:  Substernal area Pain quality: sharp   Pain radiates to:  Does not radiate Pain severity:  Moderate Onset quality:  Sudden Timing:  Intermittent Progression:  Unchanged Chronicity:  New Context: movement   Relieved by:  None tried Worsened by:  Deep breathing and movement Ineffective treatments:  None tried Associated symptoms: no abdominal pain, no back pain, no cough, no diaphoresis, no fever, no headache, no nausea, no numbness, no shortness of breath, no vomiting and no weakness   Risk factors: smoking     Past Medical History:  Diagnosis Date  . Alcohol abuse   . Anxiety   . Bipolar 1 disorder ([place])   . Cancer (HCC)    cervical cancer  . GERD (gastroesophageal reflux disease)   . Pancreatitis   . PTSD (post-traumatic stress disorder)     Patient Active Problem List   Diagnosis Date Noted  . Bipolar 1 disorder ([place]) 01/13/2019  . Eczema 10/09/2018  . Acute pancreatitis   . Alcohol induced acute pancreatitis 11/22/2017  . Duodenitis 11/22/2017  . Tobacco abuse counseling   . 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 Nondisplaced fracture of neck of fifth metacarpal bone, right  hand, initial encounter for closed fracture 08/26/2017  . Alcoholic cirrhosis of liver without ascites ([place]) 10/15/2016  . Alcohol abuse 09/04/2016  . Gastritis and gastroduodenitis 09/04/2016  . Tobacco abuse 09/04/2016  . COPD (chronic obstructive pulmonary disease) ([place]) 09/04/2016  . Elevated liver enzymes 12/22/2014  . Abdominal pain, epigastric 12/16/2014  . Chest pain with moderate risk for cardiac etiology 12/15/2014  . Alcohol dependence ([place]) 12/16/2012    Class: Acute    Past Surgical History:  Procedure Laterality Date  . ABDOMINAL HYSTERECTOMY  2004  . BIOPSY  01/02/2018   Procedure: BIOPSY;  Surgeon: Milus Banister, MD;  Location: [facility];  Service: Endoscopy;;  . ESOPHAGOGASTRODUODENOSCOPY (EGD) WITH PROPOFOL N/A 01/02/2018   Procedure: ESOPHAGOGASTRODUODENOSCOPY (EGD) WITH PROPOFOL;  Surgeon: Milus Banister, MD;  Location: [facility];  Service: Endoscopy;  Laterality: N/A;  . EUS N/A 01/02/2018   Procedure: UPPER ENDOSCOPIC ULTRASOUND (EUS) RADIAL;  Surgeon: Milus Banister, MD;  Location: [facility];  Service: Endoscopy;  Laterality: N/A;  . FOOT SURGERY    . OTHER SURGICAL HISTORY     ovarian surgery  . TUBAL LIGATION       OB History   No obstetric history on file.      Home Medications    Prior to Admission medications   Medication Sig Start Date End Date Taking? Authorizing Provider  albuterol (VENTOLIN  HFA) 108 (90 Base) MCG/ACT inhaler Inhale 1-2 puffs into the lungs every 6 (six) hours as needed for wheezing or shortness of breath. 01/13/19   Charlott Rakes, MD  aluminum-magnesium hydroxide 200-200 MG/5ML suspension Take 5 mLs by mouth every 6 (six) hours as needed for indigestion. 01/02/19   Loura Halt A, NP  buPROPion (WELLBUTRIN SR) 150 MG 12 hr tablet Take 1 tablet (150 mg total) by mouth 2 (two) times daily. For smoking cessation 01/13/19   Charlott Rakes, MD  cloNIDine (CATAPRES) 0.1 MG tablet Take 1 tablet (0.1 mg total) by mouth at  bedtime. For hot flashes 01/13/19   Charlott Rakes, MD  hydrocortisone (ANUSOL-HC) 2.5 % rectal cream Place 1 application rectally at bedtime. Use in rectum for 7 days. 12/11/17   Willia Craze, NP  mometasone-formoterol (DULERA) 200-5 MCG/ACT AERO Inhale 2 puffs into the lungs 2 (two) times daily. 01/13/19   Charlott Rakes, MD  Multiple Vitamins-Calcium (ONE-A-DAY WOMENS PO) Take 1 tablet by mouth daily.    [provider]  naltrexone (DEPADE) 50 MG tablet Take 1 tablet (50 mg total) by mouth daily. 01/13/19   Charlott Rakes, MD  nicotine (NICODERM CQ - DOSED IN MG/24 HOURS) 21 mg/24hr patch Place 1 patch (21 mg total) onto the skin daily. 06/04/18   Charlott Rakes, MD  ondansetron (ZOFRAN) 4 MG tablet Take 1 tablet (4 mg total) by mouth every 8 (eight) hours as needed for nausea or vomiting. Patient not taking: Reported on 12/25/2017 12/01/17   Maczis, Barth Kirks, PA-C  pantoprazole (PROTONIX) 40 MG tablet Take 1 tablet (40 mg total) by mouth daily. 01/13/19   Charlott Rakes, MD  triamcinolone cream (KENALOG) 0.1 % Apply 1 application topically 2 (two) times daily. 10/09/18   Charlott Rakes, MD    Family History Family History  Problem Relation Age of Onset  . Hypertension Mother   . Breast cancer Mother 49  . Hypertension Father   . Heart attack Cousin 30       died in 9s of MI  . Breast cancer Maternal Aunt   . Breast cancer Maternal Aunt   . Colon cancer Neg Hx     Social History Social History   Tobacco Use  . Smoking status: Current Every Day Smoker    Packs/day: 0.50    Types: Cigarettes  . Smokeless tobacco: Never Used  . Tobacco comment: 2-3 cigarettes daily  Substance Use Topics  . Alcohol use: Not Currently    Comment: no alchol for the past 3 weeks  . Drug use: No     Allergies   Patient has no known allergies.   Review of Systems Review of Systems  Constitutional: Negative for diaphoresis and fever.  HENT: Negative for sore throat.   Eyes: Negative  for visual disturbance.  Respiratory: Negative for cough and shortness of breath.   Cardiovascular: Positive for chest pain.  Gastrointestinal: Negative for abdominal pain, nausea and vomiting.  Genitourinary: Negative for dysuria.  Musculoskeletal: Negative for back pain.  Skin: Negative for rash.  Neurological: Negative for weakness, numbness and headaches.     Physical Exam Updated Vital Signs BP (!) 178/101 (BP Location: Left Arm)   Pulse 74   Temp 98.1 F (36.7 C) (Oral)   Resp 14   Ht 5\' 4"  (1.626 m)   Wt 51.3 kg   SpO2 100%   BMI 19.40 kg/m   Physical Exam Vitals signs and nursing note reviewed.  Constitutional:  General: She is not in acute distress.    Appearance: She is well-developed.  HENT:     Head: Normocephalic and atraumatic.  Eyes:     Conjunctiva/sclera: Conjunctivae normal.  Neck:     Musculoskeletal: Neck supple.  Cardiovascular:     Rate and Rhythm: Normal rate and regular rhythm.     Heart sounds: Normal heart sounds. No murmur.  Pulmonary:     Effort: Pulmonary effort is normal. No respiratory distress.     Breath sounds: Normal breath sounds.  Chest:     Chest wall: Tenderness present. No mass, deformity or crepitus.  Abdominal:     Palpations: Abdomen is soft. There is no mass.     Tenderness: There is no abdominal tenderness.  Musculoskeletal: Normal range of motion.     Right lower leg: She exhibits no tenderness. No edema.     Left lower leg: She exhibits no tenderness. No edema.  Skin:    General: Skin is warm and dry.     Capillary Refill: Capillary refill takes less than 2 seconds.  Neurological:     General: No focal deficit present.     Mental Status: She is alert and oriented to person, place, and time.      ED Treatments / Results  Labs (all labs ordered are listed, but only abnormal results are displayed) Labs Reviewed  BASIC METABOLIC PANEL - Abnormal; Notable for the following components:      Result Value    Sodium 134 (*)    Potassium 3.3 (*)    Chloride 97 (*)    CO2 20 (*)    Glucose, Bld 112 (*)    BUN <5 (*)    Anion gap 17 (*)    All other components within normal limits  CBC WITH DIFFERENTIAL/PLATELET - Abnormal; Notable for the following components:   Hemoglobin 15.2 (*)    MCV 101.3 (*)    MCH 34.2 (*)    All other components within normal limits  D-DIMER, QUANTITATIVE (NOT AT [facility])  TROPONIN I (HIGH SENSITIVITY)  TROPONIN I (HIGH SENSITIVITY)    EKG EKG Interpretation  Date/Time:  Sunday March 01 2019 11:45:37 EDT Ventricular Rate:  79 PR Interval:    QRS Duration: 94 QT Interval:  405 QTC Calculation: 465 R Axis:   30 Text Interpretation:  Sinus rhythm Short PR interval RSR' in V1 or V2, right VCD or RVH Confirmed by Aletta Edouard [phone]) on 03/01/2019 12:36:10 PM   Radiology Dg Chest Port 1 View  Result Date: 03/01/2019 CLINICAL DATA:  Worsening central chest wall pain. Smoker. EXAM: PORTABLE CHEST 1 VIEW COMPARISON:  05/26/2015. FINDINGS: Normal sized heart. Clear lungs. The lungs are mildly hyperexpanded with progression. Normal appearing bones. IMPRESSION: No acute abnormality. Mild changes of COPD with mild progression. Electronically Signed   By: Claudie Revering M.D.   On: 03/01/2019 12:56    Procedures Procedures (including critical care time)  Medications Ordered in ED Medications  ketorolac (TORADOL) 30 MG/ML injection 30 mg (30 mg Intravenous Given 03/01/19 1429)     Initial Impression / Assessment and Plan / ED Course  I have reviewed the triage vital signs and the nursing notes.  Pertinent labs & imaging results that were available during my care of the patient were reviewed by me and considered in my medical decision making (see chart for details).  Clinical Course as of Feb 28 1725  Sun Mar 01, 2019  1249 Patient's chest x-ray unremarkable.  Her  EKG is reading sinus rhythm with a short PR at a rate of 79 although not sure that she might have PR  prolongation.   [MB]    Clinical Course User Index [MB] Hayden Rasmussen, MD        Final Clinical Impressions(s) / ED Diagnoses   Final diagnoses:  Nonspecific chest pain  Chest wall pain    ED Discharge Orders    None       Hayden Rasmussen, MD 03/01/19 1727

## 2019-03-01 NOTE — ED Triage Notes (Signed)
Pt sts muscle pain in central chest that is worse with positioning and movement; pt sts worse with inspiration starting today

## 2019-03-01 NOTE — ED Triage Notes (Signed)
Pt arrives from [facility] with CP onset today. Worse with movement and inspiration.

## 2019-03-01 NOTE — ED Provider Notes (Signed)
[facility]    CSN: [id number] Arrival date & time: 03/01/19  1020     History   Chief Complaint Chief Complaint  Patient presents with  . Muscle Pain    HPI Felicia Perkins is a 43 y.o. female.   Patient presents with chest pain is worse with movement and deep breaths since this morning.  She denies shortness of breath, cough, nausea, vomiting, diaphoresis, fever, chills.  LMP: hysterectomy.  Smokes 0.5 ppd. Denies cardiac history.    The history is provided by the patient.    Past Medical History:  Diagnosis Date  . Alcohol abuse   . Anxiety   . Bipolar 1 disorder ([place])   . Cancer (HCC)    cervical cancer  . GERD (gastroesophageal reflux disease)   . Pancreatitis   . PTSD (post-traumatic stress disorder)     Patient Active Problem List   Diagnosis Date Noted  . Bipolar 1 disorder ([place]) 01/13/2019  . Eczema 10/09/2018  . Acute pancreatitis   . Alcohol induced acute pancreatitis 11/22/2017  . Duodenitis 11/22/2017  . Tobacco abuse counseling   . 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 Nondisplaced fracture of neck of fifth metacarpal bone, right hand, initial encounter for closed fracture 08/26/2017  . Alcoholic cirrhosis of liver without ascites ([place]) 10/15/2016  . Alcohol abuse 09/04/2016  . Gastritis and gastroduodenitis 09/04/2016  . Tobacco abuse 09/04/2016  . COPD (chronic obstructive pulmonary disease) ([place]) 09/04/2016  . Elevated liver enzymes 12/22/2014  . Abdominal pain, epigastric 12/16/2014  . Chest pain with moderate risk for cardiac etiology 12/15/2014  . Alcohol dependence ([place]) 12/16/2012    Class: Acute    Past Surgical History:  Procedure Laterality Date  . ABDOMINAL HYSTERECTOMY  2004  . BIOPSY  01/02/2018   Procedure: BIOPSY;  Surgeon: Milus Banister, MD;  Location: [facility];  Service: Endoscopy;;  . ESOPHAGOGASTRODUODENOSCOPY (EGD) WITH PROPOFOL N/A 01/02/2018   Procedure: ESOPHAGOGASTRODUODENOSCOPY (EGD) WITH PROPOFOL;   Surgeon: Milus Banister, MD;  Location: [facility];  Service: Endoscopy;  Laterality: N/A;  . EUS N/A 01/02/2018   Procedure: UPPER ENDOSCOPIC ULTRASOUND (EUS) RADIAL;  Surgeon: Milus Banister, MD;  Location: [facility];  Service: Endoscopy;  Laterality: N/A;  . FOOT SURGERY    . OTHER SURGICAL HISTORY     ovarian surgery  . TUBAL LIGATION      OB History   No obstetric history on file.      Home Medications    Prior to Admission medications   Medication Sig Start Date End Date Taking? Authorizing Provider  albuterol (VENTOLIN HFA) 108 (90 Base) MCG/ACT inhaler Inhale 1-2 puffs into the lungs every 6 (six) hours as needed for wheezing or shortness of breath. 01/13/19   Charlott Rakes, MD  aluminum-magnesium hydroxide 200-200 MG/5ML suspension Take 5 mLs by mouth every 6 (six) hours as needed for indigestion. 01/02/19   Loura Halt A, NP  buPROPion (WELLBUTRIN SR) 150 MG 12 hr tablet Take 1 tablet (150 mg total) by mouth 2 (two) times daily. For smoking cessation 01/13/19   Charlott Rakes, MD  cloNIDine (CATAPRES) 0.1 MG tablet Take 1 tablet (0.1 mg total) by mouth at bedtime. For hot flashes 01/13/19   Charlott Rakes, MD  hydrocortisone (ANUSOL-HC) 2.5 % rectal cream Place 1 application rectally at bedtime. Use in rectum for 7 days. 12/11/17   Willia Craze, NP  mometasone-formoterol (DULERA) 200-5 MCG/ACT AERO Inhale 2 puffs into the lungs 2 (two) times daily. 01/13/19  Charlott Rakes, MD  Multiple Vitamins-Calcium (ONE-A-DAY WOMENS PO) Take 1 tablet by mouth daily.    [provider]  naltrexone (DEPADE) 50 MG tablet Take 1 tablet (50 mg total) by mouth daily. 01/13/19   Charlott Rakes, MD  nicotine (NICODERM CQ - DOSED IN MG/24 HOURS) 21 mg/24hr patch Place 1 patch (21 mg total) onto the skin daily. 06/04/18   Charlott Rakes, MD  ondansetron (ZOFRAN) 4 MG tablet Take 1 tablet (4 mg total) by mouth every 8 (eight) hours as needed for nausea or vomiting. Patient not  taking: Reported on 12/25/2017 12/01/17   Maczis, Barth Kirks, PA-C  pantoprazole (PROTONIX) 40 MG tablet Take 1 tablet (40 mg total) by mouth daily. 01/13/19   Charlott Rakes, MD  triamcinolone cream (KENALOG) 0.1 % Apply 1 application topically 2 (two) times daily. 10/09/18   Charlott Rakes, MD    Family History Family History  Problem Relation Age of Onset  . Hypertension Mother   . Breast cancer Mother 17  . Hypertension Father   . Heart attack Cousin 30       died in 39s of MI  . Breast cancer Maternal Aunt   . Breast cancer Maternal Aunt   . Colon cancer Neg Hx     Social History Social History   Tobacco Use  . Smoking status: Current Every Day Smoker    Packs/day: 0.50    Types: Cigarettes  . Smokeless tobacco: Never Used  . Tobacco comment: 2-3 cigarettes daily  Substance Use Topics  . Alcohol use: Not Currently    Comment: no alchol for the past 3 weeks  . Drug use: No     Allergies   Patient has no known allergies.   Review of Systems Review of Systems  Constitutional: Negative for chills, diaphoresis and fever.  HENT: Negative for ear pain and sore throat.   Eyes: Negative for pain and visual disturbance.  Respiratory: Negative for cough and shortness of breath.   Cardiovascular: Positive for chest pain. Negative for palpitations and leg swelling.  Gastrointestinal: Negative for abdominal pain, diarrhea and vomiting.  Genitourinary: Negative for dysuria and hematuria.  Musculoskeletal: Negative for arthralgias and back pain.  Skin: Negative for color change and rash.  Neurological: Negative for seizures and syncope.  All other systems reviewed and are negative.    Physical Exam Triage Vital Signs ED Triage Vitals  Enc Vitals Group     BP 03/01/19 1038 (!) 155/63     Pulse Rate 03/01/19 1038 86     Resp 03/01/19 1038 18     Temp 03/01/19 1038 98.4 F (36.9 C)     Temp Source 03/01/19 1038 Oral     SpO2 03/01/19 1038 100 %     Weight --       Height --      Head Circumference --      Peak Flow --      Pain Score 03/01/19 1039 8     Pain Loc --      Pain Edu? --      Excl. in [place]? --    No data found.  Updated Vital Signs BP (!) 155/63 (BP Location: Right Arm)   Pulse 86   Temp 98.4 F (36.9 C) (Oral)   Resp 18   SpO2 100%   Visual Acuity Right Eye Distance:   Left Eye Distance:   Bilateral Distance:    Right Eye Near:   Left Eye Near:  Bilateral Near:     Physical Exam Vitals signs and nursing note reviewed.  Constitutional:      General: She is not in acute distress.    Appearance: She is well-developed.  HENT:     Head: Normocephalic and atraumatic.  Eyes:     Conjunctiva/sclera: Conjunctivae normal.  Neck:     Musculoskeletal: Neck supple.  Cardiovascular:     Rate and Rhythm: Normal rate and regular rhythm.     Heart sounds: Normal heart sounds.  Pulmonary:     Effort: Pulmonary effort is normal. No respiratory distress.     Breath sounds: Normal breath sounds.  Abdominal:     Palpations: Abdomen is soft.     Tenderness: There is no abdominal tenderness. There is no guarding or rebound.  Musculoskeletal:     Right lower leg: No edema.     Left lower leg: No edema.  Skin:    General: Skin is warm and dry.  Neurological:     Mental Status: She is alert.      UC Treatments / Results  Labs (all labs ordered are listed, but only abnormal results are displayed) Labs Reviewed - No data to display  EKG   Radiology No results found.  Procedures Procedures (including critical care time)  Medications Ordered in UC Medications - No data to display  Initial Impression / Assessment and Plan / UC Course  I have reviewed the triage vital signs and the nursing notes.  Pertinent labs & imaging results that were available during my care of the patient were reviewed by me and considered in my medical decision making (see chart for details).   Chest pain.  EKG changed from previous: new u  wave.  Discussed with Dr. Meda Coffee: sending to ER for evaluation.    Final Clinical Impressions(s) / UC Diagnoses   Final diagnoses:  None   Discharge Instructions   None    ED Prescriptions    None     Controlled Substance Prescriptions [place] Controlled Substance Registry consulted? Yes, I have consulted the [place] Controlled Substances Registry for this patient, and feel the risk/benefit ratio today is favorable for proceeding with this prescription for a controlled substance.   Sharion Balloon, NP 03/01/19 1116

## 2019-03-03 ENCOUNTER — Encounter: Payer: Self-pay | Admitting: Family Medicine

## 2019-03-21 ENCOUNTER — Encounter: Payer: Self-pay | Admitting: Family Medicine

## 2019-03-22 ENCOUNTER — Emergency Department (HOSPITAL_COMMUNITY): Payer: Medicaid Other

## 2019-03-22 ENCOUNTER — Encounter (HOSPITAL_COMMUNITY): Payer: Self-pay

## 2019-03-22 ENCOUNTER — Other Ambulatory Visit: Payer: Self-pay

## 2019-03-22 ENCOUNTER — Inpatient Hospital Stay (HOSPITAL_COMMUNITY)
Admission: EM | Admit: 2019-03-22 | Discharge: 2019-03-24 | DRG: 440 | Disposition: A | Discharge status: Home / Self Care | Payer: Medicaid Other | Attending: Internal Medicine | Admitting: Internal Medicine

## 2019-03-22 DIAGNOSIS — J449 Chronic obstructive pulmonary disease, unspecified: Secondary | ICD-10-CM | POA: Diagnosis present

## 2019-03-22 DIAGNOSIS — K8681 Exocrine pancreatic insufficiency: Secondary | ICD-10-CM | POA: Diagnosis present

## 2019-03-22 DIAGNOSIS — K852 Alcohol induced acute pancreatitis without necrosis or infection: Principal | ICD-10-CM | POA: Diagnosis present

## 2019-03-22 DIAGNOSIS — Z79899 Other long term (current) drug therapy: Secondary | ICD-10-CM | POA: Diagnosis not present

## 2019-03-22 DIAGNOSIS — K219 Gastro-esophageal reflux disease without esophagitis: Secondary | ICD-10-CM | POA: Diagnosis present

## 2019-03-22 DIAGNOSIS — E878 Other disorders of electrolyte and fluid balance, not elsewhere classified: Secondary | ICD-10-CM | POA: Diagnosis not present

## 2019-03-22 DIAGNOSIS — F17210 Nicotine dependence, cigarettes, uncomplicated: Secondary | ICD-10-CM | POA: Diagnosis present

## 2019-03-22 DIAGNOSIS — F1290 Cannabis use, unspecified, uncomplicated: Secondary | ICD-10-CM | POA: Diagnosis present

## 2019-03-22 DIAGNOSIS — Z720 Tobacco use: Secondary | ICD-10-CM

## 2019-03-22 DIAGNOSIS — F1010 Alcohol abuse, uncomplicated: Secondary | ICD-10-CM | POA: Diagnosis present

## 2019-03-22 DIAGNOSIS — Z8541 Personal history of malignant neoplasm of cervix uteri: Secondary | ICD-10-CM | POA: Diagnosis not present

## 2019-03-22 DIAGNOSIS — K861 Other chronic pancreatitis: Secondary | ICD-10-CM | POA: Diagnosis present

## 2019-03-22 DIAGNOSIS — K5900 Constipation, unspecified: Secondary | ICD-10-CM

## 2019-03-22 DIAGNOSIS — Z20828 Contact with and (suspected) exposure to other viral communicable diseases: Secondary | ICD-10-CM | POA: Diagnosis present

## 2019-03-22 DIAGNOSIS — Z7289 Other problems related to lifestyle: Secondary | ICD-10-CM

## 2019-03-22 DIAGNOSIS — K7030 Alcoholic cirrhosis of liver without ascites: Secondary | ICD-10-CM | POA: Diagnosis present

## 2019-03-22 DIAGNOSIS — E876 Hypokalemia: Secondary | ICD-10-CM | POA: Diagnosis present

## 2019-03-22 DIAGNOSIS — F4310 Post-traumatic stress disorder, unspecified: Secondary | ICD-10-CM | POA: Diagnosis present

## 2019-03-22 DIAGNOSIS — K859 Acute pancreatitis without necrosis or infection, unspecified: Secondary | ICD-10-CM

## 2019-03-22 DIAGNOSIS — F419 Anxiety disorder, unspecified: Secondary | ICD-10-CM | POA: Diagnosis present

## 2019-03-22 DIAGNOSIS — K2970 Gastritis, unspecified, without bleeding: Secondary | ICD-10-CM | POA: Diagnosis present

## 2019-03-22 DIAGNOSIS — F319 Bipolar disorder, unspecified: Secondary | ICD-10-CM | POA: Diagnosis present

## 2019-03-22 DIAGNOSIS — Z90710 Acquired absence of both cervix and uterus: Secondary | ICD-10-CM

## 2019-03-22 DIAGNOSIS — E871 Hypo-osmolality and hyponatremia: Secondary | ICD-10-CM | POA: Diagnosis not present

## 2019-03-22 DIAGNOSIS — Z8249 Family history of ischemic heart disease and other diseases of the circulatory system: Secondary | ICD-10-CM | POA: Diagnosis not present

## 2019-03-22 HISTORY — DX: Acute pancreatitis without necrosis or infection, unspecified: K85.90

## 2019-03-22 LAB — GLUCOSE, CAPILLARY
Glucose-Capillary: 79 mg/dL (ref 70–99)
Glucose-Capillary: 85 mg/dL (ref 70–99)
Glucose-Capillary: 88 mg/dL (ref 70–99)

## 2019-03-22 LAB — APTT: aPTT: 30 seconds (ref 24–36)

## 2019-03-22 LAB — URINALYSIS, ROUTINE W REFLEX MICROSCOPIC
Bilirubin Urine: NEGATIVE
Glucose, UA: NEGATIVE mg/dL
Hgb urine dipstick: NEGATIVE
Ketones, ur: 20 mg/dL — AB
Leukocytes,Ua: NEGATIVE
Nitrite: NEGATIVE
Protein, ur: NEGATIVE mg/dL
Specific Gravity, Urine: 1.013 (ref 1.005–1.030)
pH: 6 (ref 5.0–8.0)

## 2019-03-22 LAB — CBC
HCT: 39.8 % (ref 36.0–46.0)
Hemoglobin: 13.7 g/dL (ref 12.0–15.0)
MCH: 34.8 pg — ABNORMAL HIGH (ref 26.0–34.0)
MCHC: 34.4 g/dL (ref 30.0–36.0)
MCV: 101 fL — ABNORMAL HIGH (ref 80.0–100.0)
Platelets: 160 10*3/uL (ref 150–400)
RBC: 3.94 MIL/uL (ref 3.87–5.11)
RDW: 12.3 % (ref 11.5–15.5)
WBC: 7.4 10*3/uL (ref 4.0–10.5)
nRBC: 0 % (ref 0.0–0.2)

## 2019-03-22 LAB — LIPID PANEL
Cholesterol: 240 mg/dL — ABNORMAL HIGH (ref 0–200)
HDL: 65 mg/dL (ref >40)
LDL Cholesterol: 157 mg/dL — ABNORMAL HIGH (ref 0–99)
Total CHOL/HDL Ratio: 3.7 RATIO
Triglycerides: 90 mg/dL (ref <150)
VLDL: 18 mg/dL (ref 0–40)

## 2019-03-22 LAB — SARS CORONAVIRUS 2 (TAT 6-24 HRS): SARS Coronavirus 2: NEGATIVE

## 2019-03-22 LAB — COMPREHENSIVE METABOLIC PANEL
ALT: 40 U/L (ref 0–44)
AST: 63 U/L — ABNORMAL HIGH (ref 15–41)
Albumin: 4.1 g/dL (ref 3.5–5.0)
Alkaline Phosphatase: 94 U/L (ref 38–126)
Anion gap: 15 (ref 5–15)
BUN: <5 mg/dL — ABNORMAL LOW (ref 6–20)
CO2: 24 mmol/L (ref 22–32)
Calcium: 9.9 mg/dL (ref 8.9–10.3)
Chloride: 94 mmol/L — ABNORMAL LOW (ref 98–111)
Creatinine, Ser: 0.63 mg/dL (ref 0.44–1.00)
GFR calc Af Amer: >60 mL/min (ref >60)
GFR calc non Af Amer: >60 mL/min (ref >60)
Glucose, Bld: 122 mg/dL — ABNORMAL HIGH (ref 70–99)
Potassium: 3 mmol/L — ABNORMAL LOW (ref 3.5–5.1)
Sodium: 133 mmol/L — ABNORMAL LOW (ref 135–145)
Total Bilirubin: 0.8 mg/dL (ref 0.3–1.2)
Total Protein: 7.4 g/dL (ref 6.5–8.1)

## 2019-03-22 LAB — PROTIME-INR
INR: 1.1 (ref 0.8–1.2)
Prothrombin Time: 13.6 seconds (ref 11.4–15.2)

## 2019-03-22 LAB — I-STAT BETA HCG BLOOD, ED (MC, WL, AP ONLY): I-stat hCG, quantitative: <5 m[IU]/mL (ref <5)

## 2019-03-22 LAB — LIPASE, BLOOD: Lipase: 61 U/L — ABNORMAL HIGH (ref 11–51)

## 2019-03-22 MED ORDER — MOMETASONE FURO-FORMOTEROL FUM 200-5 MCG/ACT IN AERO
2.0000 | INHALATION_SPRAY | Freq: Two times a day (BID) | RESPIRATORY_TRACT | Status: DC
  Administered 2019-03-22 – 2019-03-24 (×3): 2 via RESPIRATORY_TRACT
  Filled 2019-03-22: qty 8.8

## 2019-03-22 MED ORDER — LACTATED RINGERS IV SOLN
INTRAVENOUS | Status: AC
  Administered 2019-03-22 – 2019-03-23 (×2): via INTRAVENOUS

## 2019-03-22 MED ORDER — ACETAMINOPHEN 650 MG RE SUPP: 650.0000 mg | Freq: Four times a day (QID) | RECTAL | Status: DC | PRN

## 2019-03-22 MED ORDER — THIAMINE HCL 100 MG/ML IJ SOLN
100.0000 mg | Freq: Every day | INTRAMUSCULAR | Status: DC
  Administered 2019-03-24: 100 mg via INTRAVENOUS
  Filled 2019-03-22: qty 2

## 2019-03-22 MED ORDER — PANTOPRAZOLE SODIUM 40 MG IV SOLR
40.0000 mg | Freq: Once | INTRAVENOUS | Status: AC
  Administered 2019-03-22: 40 mg via INTRAVENOUS
  Filled 2019-03-22: qty 40

## 2019-03-22 MED ORDER — OXYCODONE HCL 5 MG PO TABS
5.0000 mg | ORAL_TABLET | ORAL | Status: DC | PRN
  Administered 2019-03-22 – 2019-03-23 (×3): 5 mg via ORAL
  Filled 2019-03-22 (×3): qty 1

## 2019-03-22 MED ORDER — POTASSIUM CHLORIDE 10 MEQ/100ML IV SOLN
10.0000 meq | INTRAVENOUS | Status: AC
  Administered 2019-03-22 (×6): 10 meq via INTRAVENOUS
  Filled 2019-03-22 (×6): qty 100

## 2019-03-22 MED ORDER — ONDANSETRON HCL 4 MG/2ML IJ SOLN
4.0000 mg | Freq: Four times a day (QID) | INTRAMUSCULAR | Status: DC | PRN
  Administered 2019-03-23: 4 mg via INTRAVENOUS
  Filled 2019-03-22: qty 2

## 2019-03-22 MED ORDER — ADULT MULTIVITAMIN W/MINERALS CH
1.0000 | ORAL_TABLET | Freq: Every day | ORAL | Status: DC
  Administered 2019-03-22 – 2019-03-24 (×3): 1 via ORAL
  Filled 2019-03-22 (×3): qty 1

## 2019-03-22 MED ORDER — VITAMIN B-1 100 MG PO TABS
100.0000 mg | ORAL_TABLET | Freq: Every day | ORAL | Status: DC
  Administered 2019-03-22 – 2019-03-23 (×2): 100 mg via ORAL
  Filled 2019-03-22 (×2): qty 1

## 2019-03-22 MED ORDER — SENNA 8.6 MG PO TABS
1.0000 | ORAL_TABLET | Freq: Two times a day (BID) | ORAL | Status: DC
  Administered 2019-03-22 – 2019-03-24 (×5): 8.6 mg via ORAL
  Filled 2019-03-22 (×5): qty 1

## 2019-03-22 MED ORDER — SODIUM CHLORIDE 0.9 % IV BOLUS
1000.0000 mL | Freq: Once | INTRAVENOUS | Status: AC
  Administered 2019-03-22: 1000 mL via INTRAVENOUS

## 2019-03-22 MED ORDER — MORPHINE SULFATE (PF) 4 MG/ML IV SOLN
4.0000 mg | INTRAVENOUS | Status: DC | PRN
  Administered 2019-03-22 – 2019-03-24 (×7): 4 mg via INTRAVENOUS
  Filled 2019-03-22 (×7): qty 1

## 2019-03-22 MED ORDER — MORPHINE SULFATE (PF) 4 MG/ML IV SOLN
4.0000 mg | Freq: Once | INTRAVENOUS | Status: AC
  Administered 2019-03-22: 4 mg via INTRAVENOUS
  Filled 2019-03-22: qty 1

## 2019-03-22 MED ORDER — ALBUTEROL SULFATE HFA 108 (90 BASE) MCG/ACT IN AERS
1.0000 | INHALATION_SPRAY | Freq: Four times a day (QID) | RESPIRATORY_TRACT | Status: DC | PRN
  Filled 2019-03-22: qty 6.7

## 2019-03-22 MED ORDER — ONDANSETRON HCL 4 MG PO TABS: 4.0000 mg | ORAL_TABLET | Freq: Four times a day (QID) | ORAL | Status: DC | PRN

## 2019-03-22 MED ORDER — POLYETHYLENE GLYCOL 3350 17 G PO PACK: 17.0000 g | PACK | Freq: Every day | ORAL | Status: DC | PRN

## 2019-03-22 MED ORDER — IOHEXOL 300 MG/ML  SOLN
100.0000 mL | Freq: Once | INTRAMUSCULAR | Status: AC | PRN
  Administered 2019-03-22: 80 mL via INTRAVENOUS

## 2019-03-22 MED ORDER — SODIUM CHLORIDE 0.9 % IV BOLUS
1000.0000 mL | Freq: Once | INTRAVENOUS | Status: AC
  Administered 2019-03-22: 14:00:00 1000 mL via INTRAVENOUS

## 2019-03-22 MED ORDER — POTASSIUM CHLORIDE 10 MEQ/100ML IV SOLN
10.0000 meq | INTRAVENOUS | Status: AC
  Administered 2019-03-22 (×2): 10 meq via INTRAVENOUS
  Filled 2019-03-22 (×2): qty 100

## 2019-03-22 MED ORDER — PANTOPRAZOLE SODIUM 40 MG PO TBEC
40.0000 mg | DELAYED_RELEASE_TABLET | Freq: Two times a day (BID) | ORAL | Status: DC
  Administered 2019-03-22 – 2019-03-24 (×4): 40 mg via ORAL
  Filled 2019-03-22 (×5): qty 1

## 2019-03-22 MED ORDER — SODIUM CHLORIDE 0.9% FLUSH: 3.0000 mL | Freq: Once | INTRAVENOUS | Status: DC

## 2019-03-22 MED ORDER — ENOXAPARIN SODIUM 40 MG/0.4ML ~~LOC~~ SOLN
40.0000 mg | SUBCUTANEOUS | Status: DC
  Administered 2019-03-22 – 2019-03-23 (×2): 40 mg via SUBCUTANEOUS
  Filled 2019-03-22 (×3): qty 0.4

## 2019-03-22 MED ORDER — ONDANSETRON HCL 4 MG/2ML IJ SOLN
4.0000 mg | Freq: Once | INTRAMUSCULAR | Status: AC
  Administered 2019-03-22: 4 mg via INTRAVENOUS
  Filled 2019-03-22: qty 2

## 2019-03-22 MED ORDER — FOLIC ACID 1 MG PO TABS
1.0000 mg | ORAL_TABLET | Freq: Every day | ORAL | Status: DC
  Administered 2019-03-22 – 2019-03-24 (×3): 1 mg via ORAL
  Filled 2019-03-22 (×3): qty 1

## 2019-03-22 MED ORDER — ACETAMINOPHEN 325 MG PO TABS: 650.0000 mg | ORAL_TABLET | Freq: Four times a day (QID) | ORAL | Status: DC | PRN

## 2019-03-22 NOTE — ED Triage Notes (Signed)
Patient complains of 1 week of increasing abdominal pain and cramping. States that any intake makes the pain worse. States that she thinks this is her gastritis.

## 2019-03-22 NOTE — ED Provider Notes (Signed)
[facility] EMERGENCY DEPARTMENT Provider Note   CSN: [id number] Arrival date & time: 03/22/19  4656    History   Chief Complaint Chief Complaint  Patient presents with   Abdominal Pain    HPI Felicia Perkins is a 43 y.o. female.     The history is provided by the patient and medical records. No language interpreter was used.  Abdominal Pain Associated symptoms: nausea and vomiting   Associated symptoms: no constipation and no diarrhea    Felicia Perkins is a 43 y.o. female  with a PMH of pancreatitis, gastritis, ETOH abuse who presents to the Emergency Department complaining of central upper abdominal pain x 6 days. Has tried taking Tylenol with no improvement. Yesterday, pain began radiating to her back. Feels similar to previous flares of pancreatitis. Associated with nausea. One episode of emesis. She has not been able to eat anything this week, but has been doing her best to stay hydrated. She has not had a BM this week either. Unsure if she is constipated or if she just hasn't eaten, therefore not having BM's. No fever/chills. No urinary symptoms. No cough, congestion, chest pain.  Does still endorse drinking. Denies NSAID use.   Past Medical History:  Diagnosis Date   Alcohol abuse    Anxiety    Bipolar 1 disorder ([place])    Cancer ([place])    cervical cancer   GERD (gastroesophageal reflux disease)    Pancreatitis    PTSD (post-traumatic stress disorder)     Patient Active Problem List   Diagnosis Date Noted   Bipolar 1 disorder ([place]) 01/13/2019   Eczema 10/09/2018   Acute pancreatitis    Alcohol induced acute pancreatitis 11/22/2017   Duodenitis 11/22/2017   Tobacco abuse counseling    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   Nondisplaced fracture of neck of fifth metacarpal bone, right hand, initial encounter for closed fracture 81/27/5170   Alcoholic cirrhosis of liver without ascites ([place]) 10/15/2016   Alcohol abuse 09/04/2016     Gastritis and gastroduodenitis 09/04/2016   Tobacco abuse 09/04/2016   COPD (chronic obstructive pulmonary disease) ([place]) 09/04/2016   Elevated liver enzymes 12/22/2014   Abdominal pain, epigastric 12/16/2014   Chest pain with moderate risk for cardiac etiology 12/15/2014   Alcohol dependence ([place]) 12/16/2012    Class: Acute    Past Surgical History:  Procedure Laterality Date   ABDOMINAL HYSTERECTOMY  2004   BIOPSY  01/02/2018   Procedure: BIOPSY;  Surgeon: Milus Banister, MD;  Location: [facility];  Service: Endoscopy;;   ESOPHAGOGASTRODUODENOSCOPY (EGD) WITH PROPOFOL N/A 01/02/2018   Procedure: ESOPHAGOGASTRODUODENOSCOPY (EGD) WITH PROPOFOL;  Surgeon: Milus Banister, MD;  Location: [facility] END[facility];  Service: Endoscopy;  Laterality: N/A;   EUS N/A 01/02/2018   Procedure: UPPER ENDOSCOPIC ULTRASOUND (EUS) RADIAL;  Surgeon: Milus Banister, MD;  Location: [facility] ENDOSCOPY;  Service: Endoscopy;  Laterality: N/A;   FOOT SURGERY     OTHER SURGICAL HISTORY     ovarian surgery   TUBAL LIGATION       OB History   No obstetric history on file.      Home Medications    Prior to Admission medications   Medication Sig Start Date End Date Taking? Authorizing Provider  albuterol (VENTOLIN HFA) 108 (90 Base) MCG/ACT inhaler Inhale 1-2 puffs into the lungs every 6 (six) hours as needed for wheezing or shortness of breath. 01/13/19  Yes Charlott Rakes, MD  cloNIDine (CATAPRES) 0.1 MG tablet Take 1 tablet (  0.1 mg total) by mouth at bedtime. For hot flashes 01/13/19  Yes Newlin, Charlane Ferretti, MD  mometasone-formoterol (DULERA) 200-5 MCG/ACT AERO Inhale 2 puffs into the lungs 2 (two) times daily. 01/13/19  Yes Charlott Rakes, MD  Multiple Vitamins-Calcium (ONE-A-DAY WOMENS PO) Take 1 tablet by mouth daily.   Yes [provider]  nicotine (NICODERM CQ - DOSED IN MG/24 HOURS) 21 mg/24hr patch Place 1 patch (21 mg total) onto the skin daily. 06/04/18  Yes Charlott Rakes, MD   pantoprazole (PROTONIX) 40 MG tablet Take 1 tablet (40 mg total) by mouth daily. 01/13/19  Yes Charlott Rakes, MD  triamcinolone cream (KENALOG) 0.1 % Apply 1 application topically 2 (two) times daily. 10/09/18  Yes Charlott Rakes, MD  aluminum-magnesium hydroxide 200-200 MG/5ML suspension Take 5 mLs by mouth every 6 (six) hours as needed for indigestion. Patient not taking: Reported on 03/22/2019 01/02/19   Loura Halt A, NP  buPROPion (WELLBUTRIN SR) 150 MG 12 hr tablet Take 1 tablet (150 mg total) by mouth 2 (two) times daily. For smoking cessation Patient not taking: Reported on 03/22/2019 01/13/19   Charlott Rakes, MD  hydrocortisone (ANUSOL-HC) 2.5 % rectal cream Place 1 application rectally at bedtime. Use in rectum for 7 days. Patient not taking: Reported on 03/22/2019 12/11/17   Willia Craze, NP  naltrexone (DEPADE) 50 MG tablet Take 1 tablet (50 mg total) by mouth daily. Patient not taking: Reported on 03/22/2019 01/13/19   Charlott Rakes, MD  ondansetron (ZOFRAN) 4 MG tablet Take 1 tablet (4 mg total) by mouth every 8 (eight) hours as needed for nausea or vomiting. Patient not taking: Reported on 12/25/2017 12/01/17   Maczis, Barth Kirks, PA-C    Family History Family History  Problem Relation Age of Onset   Hypertension Mother    Breast cancer Mother 36   Hypertension Father    Heart attack Cousin 9       died in 22s of MI   Breast cancer Maternal Aunt    Breast cancer Maternal Aunt    Colon cancer Neg Hx     Social History Social History   Tobacco Use   Smoking status: Current Every Day Smoker    Packs/day: 0.50    Types: Cigarettes   Smokeless tobacco: Never Used   Tobacco comment: 2-3 cigarettes daily  Substance Use Topics   Alcohol use: Not Currently    Comment: no alchol for the past 3 weeks   Drug use: No     Allergies   Patient has no known allergies.   Review of Systems Review of Systems  Gastrointestinal: Positive for abdominal pain, nausea and  vomiting. Negative for blood in stool, constipation and diarrhea.  All other systems reviewed and are negative.    Physical Exam Updated Vital Signs BP (!) 149/93    Pulse 61    Temp 98.2 F (36.8 C) (Oral)    Resp 18    SpO2 99%   Physical Exam Vitals signs and nursing note reviewed.  Constitutional:      General: She is not in acute distress.    Appearance: She is well-developed.     Comments: Non-toxic appearing, but appears very uncomfortable.  HENT:     Head: Normocephalic and atraumatic.  Neck:     Musculoskeletal: Neck supple.  Cardiovascular:     Rate and Rhythm: Normal rate and regular rhythm.     Heart sounds: Normal heart sounds. No murmur.  Pulmonary:     Effort: Pulmonary  effort is normal. No respiratory distress.     Breath sounds: Normal breath sounds.  Abdominal:     General: There is no distension.     Palpations: Abdomen is soft.     Comments: Upper abdominal tenderness. No rebound tenderness. Negative Murphy's.  Skin:    General: Skin is warm and dry.  Neurological:     Mental Status: She is alert and oriented to person, place, and time.      ED Treatments / Results  Labs (all labs ordered are listed, but only abnormal results are displayed) Labs Reviewed  LIPASE, BLOOD - Abnormal; Notable for the following components:      Result Value   Lipase 61 (*)    All other components within normal limits  COMPREHENSIVE METABOLIC PANEL - Abnormal; Notable for the following components:   Sodium 133 (*)    Potassium 3.0 (*)    Chloride 94 (*)    Glucose, Bld 122 (*)    BUN <5 (*)    AST 63 (*)    All other components within normal limits  CBC - Abnormal; Notable for the following components:   MCV 101.0 (*)    MCH 34.8 (*)    All other components within normal limits  URINALYSIS, ROUTINE W REFLEX MICROSCOPIC - Abnormal; Notable for the following components:   Color, Urine AMBER (*)    APPearance HAZY (*)    Ketones, ur 20 (*)    All other  components within normal limits  SARS CORONAVIRUS 2  I-STAT BETA HCG BLOOD, ED ([facility], [facility], [facility] ONLY)    EKG None  Radiology Ct Abdomen Pelvis W Contrast  Result Date: 03/22/2019 CLINICAL DATA:  Mid abdominal pain for 6 days. No bowel movement for 4 days. Previous hysterectomy. EXAM: CT ABDOMEN AND PELVIS WITH CONTRAST TECHNIQUE: Multidetector CT imaging of the abdomen and pelvis was performed using the standard protocol following bolus administration of intravenous contrast. CONTRAST:  42mL OMNIPAQUE IOHEXOL 300 MG/ML  SOLN COMPARISON:  Abdominal radiographs 01/02/2019. Abdominal CT 03/24/2018. FINDINGS: Lower chest: Clear lung bases. No significant pleural or pericardial effusion. Hepatobiliary: The liver is normal in density without suspicious focal abnormality. No evidence of gallstones, gallbladder wall thickening or biliary dilatation. Pancreas: There are small calcifications within the pancreatic head with mild surrounding inflammation suspicious for recurrent pancreatitis. There is homogeneous enhancement of the pancreas, and no pancreatic ductal dilatation or surrounding fluid collection. There is a small calcification in the pancreatic tail (image 27/3) which is unchanged. Spleen: Normal in size without focal abnormality. Adrenals/Urinary Tract: Both adrenal glands appear normal. Tiny cyst in the upper pole of the right kidney. The kidneys otherwise appear normal. No evidence of urinary tract calculus or hydronephrosis. The bladder appears normal. Stomach/Bowel: No evidence of bowel wall thickening, distention or surrounding inflammatory change. The stomach is poorly distended. There is possible gastric wall thickening. The appendix is not clearly visualized. There is no pericecal inflammation. Vascular/Lymphatic: There are no enlarged abdominal or pelvic lymph nodes. Aortic and branch vessel atherosclerosis. No acute vascular findings. The portal, superior mesenteric and splenic veins are patent.  Reproductive: The uterus and ovaries appear normal. No adnexal mass. Other: No evidence of abdominal wall mass or hernia. No ascites. Musculoskeletal: No acute or significant osseous findings. IMPRESSION: 1. Inflammatory changes surrounding the pancreatic head associated with underlying parenchymal calcifications, consistent with acute on chronic pancreatitis in this patient with a mildly elevated serum lipase level. No evidence of pancreatic necrosis or biliary ductal dilatation.  2. Poor distension of the stomach. Possible gastric wall thickening. 3.  Aortic Atherosclerosis (ICD10-I70.0). Electronically Signed   By: Richardean Sale M.D.   On: 03/22/2019 12:48    Procedures Procedures (including critical care time)  Medications Ordered in ED Medications  sodium chloride flush (NS) 0.9 % injection 3 mL (has no administration in time range)  potassium chloride 10 mEq in 100 mL IVPB (10 mEq Intravenous New Bag/Given 03/22/19 1252)  sodium chloride 0.9 % bolus 1,000 mL (1,000 mLs Intravenous New Bag/Given 03/22/19 1036)  morphine 4 MG/ML injection 4 mg (4 mg Intravenous Given 03/22/19 1031)  pantoprazole (PROTONIX) injection 40 mg (40 mg Intravenous Given 03/22/19 1031)  ondansetron (ZOFRAN) injection 4 mg (4 mg Intravenous Given 03/22/19 1031)  iohexol (OMNIPAQUE) 300 MG/ML solution 100 mL (80 mLs Intravenous Contrast Given 03/22/19 1101)     Initial Impression / Assessment and Plan / ED Course  I have reviewed the triage vital signs and the nursing notes.  Pertinent labs & imaging results that were available during my care of the patient were reviewed by me and considered in my medical decision making (see chart for details).       Felicia Perkins is a 43 y.o. female who presents to ED for upper abdominal pain c/w her prior flares of pancreatitis. Afebrile. Hemodynamically stable.  Significant amount of tenderness to the epigastrium.  Her pain feels as if it radiates to the back.  History and exam  consistent with pancreatitis.  Her lipase is only minimally elevated at 61.  CT shows inflammatory changes surrounding the pancreatic head consistent with acute on chronic pancreatitis which does correlate with her history and minimally elevated lipase level.  Unfortunately, she had significant amount of pain return with just a few sips of water.  Do not feel she would be a great candidate for home treatment of her pancreatitis.  Discussed with IM teaching service who will admit.   Final Clinical Impressions(s) / ED Diagnoses   Final diagnoses:  Alcohol-induced acute pancreatitis without infection or necrosis    ED Discharge Orders    None       Novah Goza, Ozella Almond, PA-C 03/22/19 1327    Blanchie Dessert, MD 03/22/19 2106

## 2019-03-22 NOTE — ED Notes (Signed)
Patient transported to CT 

## 2019-03-22 NOTE — Plan of Care (Signed)
  Problem: Education: Goal: Knowledge of General Education information will improve Description Including pain rating scale, medication(s)/side effects and non-pharmacologic comfort measures Outcome: Progressing   

## 2019-03-22 NOTE — H&P (Signed)
Date: 03/22/2019               Patient Name:  Felicia Perkins MRN: [id number]  DOB: May 10, 1976 Age / Sex: 43 y.o., female   PCP: Charlott Rakes, MD         Medical Service: Internal Medicine Teaching Service         Attending Physician: Dr. Lucious Groves, DO    First Contact: Dr. Charleen Kirks  Pager: [phone]  Second Contact: Dr. Tarri Abernethy Pager: [phone]       After Hours (After 5p/  First Contact Pager: [phone]  weekends / holidays): Second Contact Pager: [phone]   Chief Complaint: Abdominal Pain  History of Present Illness:   Felicia Perkins is a 43 y.o female with alcoholic cirrhosis, alcohol use disorder, and chronic pancreatitis who presented to the ED with abdominal pain. She woke up Monday (6 days ago) with sharp, epigastric abdominal pain that radiated to her back. Initially she thought it was 2/2 constipation as she has not had a bowel movement all week. She tried laxatives, including Miralax, and Tylenol without any alleviation of pain. The pain continued to progress and seemed to be exacerbated by any type of PO intake, including water. She feels that she has to keep fidgeting or sit up to try to get relief. Pain has not improved at all since onset. She has been unable to sleep at night due to the pain. Last drink of EtOH was approximately 11 days ago. Felicia Perkins reports she has had similar pain in the past, approximately 3 years ago. At that time, she was told it was pancreatitis and was hospitalized for about a week.  Previously seen by [facility] but has not seen them in the office since 12/11/2017. EGD on 01/02/2018 with duodenitis and gastritis. No esophageal varices at that time. Endorses constipation, productive cough (chronic), initially N/V, early satiety, bloating, decreased urine output. Denies CP, SHOB, fevers, chills, hematuria, hematemesis. Denies NSAID use.   ED Course:  Initial blood work shows unremarkable CBC lipase of 61.  CMP demonstrates mild hyponatremia  of 133 and hypokalemia at 3.0, hypo-chloremia at 94.  AST is elevated at 63, but other liver enzymes are within normal limits.  UA is positive for ketones.  CT abdomen with contrast shows inflammatory changes around the pancreatic head with underlying parenchymal calcifications, consistent with acute on chronic pancreatitis.  Meds:  Current Meds  Medication Sig  . albuterol (VENTOLIN HFA) 108 (90 Base) MCG/ACT inhaler Inhale 1-2 puffs into the lungs every 6 (six) hours as needed for wheezing or shortness of breath.  . cloNIDine (CATAPRES) 0.1 MG tablet Take 1 tablet (0.1 mg total) by mouth at bedtime. For hot flashes  . mometasone-formoterol (DULERA) 200-5 MCG/ACT AERO Inhale 2 puffs into the lungs 2 (two) times daily.  . Multiple Vitamins-Calcium (ONE-A-DAY WOMENS PO) Take 1 tablet by mouth daily.  . nicotine (NICODERM CQ - DOSED IN MG/24 HOURS) 21 mg/24hr patch Place 1 patch (21 mg total) onto the skin daily.  . pantoprazole (PROTONIX) 40 MG tablet Take 1 tablet (40 mg total) by mouth daily.  Marland Kitchen triamcinolone cream (KENALOG) 0.1 % Apply 1 application topically 2 (two) times daily.   Allergies: Allergies as of 03/22/2019  . (No Known Allergies)   Past Medical History:  Diagnosis Date  . Alcohol abuse   . Anxiety   . Bipolar 1 disorder ([place])   . Cancer ([facility])    cervical cancer  . GERD (gastroesophageal  reflux disease)   . Pancreatitis   . PTSD (post-traumatic stress disorder)    Family History  Problem Relation Age of Onset  . Hypertension Mother   . Breast cancer Mother 25  . Pancreatitis Mother   . Hypertension Father   . Heart attack Cousin 30       died in 19s of MI  . Breast cancer Maternal Aunt   . Breast cancer Maternal Aunt   . Colon cancer Neg Hx    Social History:  Previous EtOH use, last drank was 11 days ago. No history of withdrawals, including seizures. 4-5 cigarettes per day  Uses Marijuana, last used 1 week ago.    Review of Systems: A complete ROS was  negative except as per HPI.   Physical Exam: Blood pressure (!) 149/93, pulse 61, temperature 98.2 F (36.8 C), temperature source Oral, resp. rate 18, SpO2 99 %.  General: Well nourished female in no acute distress HENT: Normocephalic, atraumatic, moist mucus membranes, no icterus  Pulm: Good air movement with no wheezing or crackles  CV: RRR, no murmurs, no rubs  Abdomen: Active bowel sounds, soft, non-distended, no fluid wave, no caput medusa, tenderness to palpation of the epigastric region with guarding  Extremities: Pulses palpable in all extremities, no LE edema  Skin: Warm and dry, no telangiectasias   Neuro: Alert and oriented x 3, no asterixis   CT Abdomen w/Contrast  1. Inflammatory changes surrounding the pancreatic head associated with underlying parenchymal calcifications, consistent with acute on chronic pancreatitis in this patient with a mildly elevated serum lipase level. No evidence of pancreatic necrosis or biliary ductal dilatation. 2. Poor distension of the stomach. Possible gastric wall thickening. 3. Aortic Atherosclerosis (ICD10-I70.0).  Assessment & Plan by Problem: Active Problems:   Acute on chronic pancreatitis (H[facility])  #Acute on Chronic Pancreatitis:  Felicia Perkins has a extensive history of alcohol abuse complicated by pancreatitis. On initial examination, she noted that current flare feels like her last pancreatitis bout.  Lipase is only minimally elevated however given her history of chronic pancreatitis this is not surprising.  CT supports acute on chronic pancreatitis.  Vitals signs are stable and patient has remained afebrile.  No leukocytosis.  Will manage with supportive care.  - Pain control with morphine 4 mg/mL every 4 hours as needed, and oxycodone 5 mg every 4 hours as needed - Received normal saline 1 L bolus. - Maintenance fluid: LR at 125 mL/h - Advance diet as tolerated  #Alcohol use disorder Patient reports her last drink was more than 10  days ago however given current pancreatitis flare, will initiate CIWA protocol just in case.  - CIWA protocol - Thiamine and folic acid supplementation  #Hypokalemia:  Likely due to poor po intake seoncdary to abdominal pain.  - KCl 37mEq IVPB - Recheck BMP tomorrow AM  #Constipation Bowel regimen in place. Patient is willing to advance to enemas if needed.   - Miralax and Senna    Dispo: Admit patient to Inpatient with expected length of stay greater than 2 midnights.  Signed:  Dr. Jose Persia Internal Medicine PGY-1  Pager: [phone] 03/22/2019, 3:26 PM

## 2019-03-23 LAB — HIV ANTIBODY (ROUTINE TESTING W REFLEX): HIV Screen 4th Generation wRfx: NONREACTIVE

## 2019-03-23 LAB — RENAL FUNCTION PANEL
Albumin: 2.8 g/dL — ABNORMAL LOW (ref 3.5–5.0)
Anion gap: 10 (ref 5–15)
BUN: <5 mg/dL — ABNORMAL LOW (ref 6–20)
CO2: 19 mmol/L — ABNORMAL LOW (ref 22–32)
Calcium: 8.7 mg/dL — ABNORMAL LOW (ref 8.9–10.3)
Chloride: 106 mmol/L (ref 98–111)
Creatinine, Ser: 0.47 mg/dL (ref 0.44–1.00)
GFR calc Af Amer: >60 mL/min (ref >60)
GFR calc non Af Amer: >60 mL/min (ref >60)
Glucose, Bld: 83 mg/dL (ref 70–99)
Phosphorus: 3.5 mg/dL (ref 2.5–4.6)
Potassium: 3.7 mmol/L (ref 3.5–5.1)
Sodium: 135 mmol/L (ref 135–145)

## 2019-03-23 LAB — CBC
HCT: 34.1 % — ABNORMAL LOW (ref 36.0–46.0)
Hemoglobin: 11.2 g/dL — ABNORMAL LOW (ref 12.0–15.0)
MCH: 34.6 pg — ABNORMAL HIGH (ref 26.0–34.0)
MCHC: 32.8 g/dL (ref 30.0–36.0)
MCV: 105.2 fL — ABNORMAL HIGH (ref 80.0–100.0)
Platelets: 131 10*3/uL — ABNORMAL LOW (ref 150–400)
RBC: 3.24 MIL/uL — ABNORMAL LOW (ref 3.87–5.11)
RDW: 12.7 % (ref 11.5–15.5)
WBC: 7.4 10*3/uL (ref 4.0–10.5)
nRBC: 0 % (ref 0.0–0.2)

## 2019-03-23 LAB — MAGNESIUM: Magnesium: 1.4 mg/dL — ABNORMAL LOW (ref 1.7–2.4)

## 2019-03-23 LAB — GLUCOSE, CAPILLARY
Glucose-Capillary: 80 mg/dL (ref 70–99)
Glucose-Capillary: 84 mg/dL (ref 70–99)

## 2019-03-23 MED ORDER — MAGNESIUM SULFATE 4 GM/100ML IV SOLN
4.0000 g | Freq: Once | INTRAVENOUS | Status: AC
  Administered 2019-03-23: 4 g via INTRAVENOUS
  Filled 2019-03-23: qty 100

## 2019-03-23 NOTE — Progress Notes (Signed)
   Subjective:   [name] reports her abdominal pain is a bit better today but is still present. It is still intermittent in nature and exacerbated by anything PO. She denies other pain at this time. [name] would like to advance to clear liquids this morning.   Objective:  Vital signs in last 24 hours: Vitals:   03/22/19 1608 03/22/19 1900 03/22/19 2100 03/23/19 0401  BP: (!) 137/94 (!) 141/89  117/82  Pulse: 62 68  69  Resp: 18 18  16   Temp: 97.9 F (36.6 C) 97.6 F (36.4 C)  98 F (36.7 C)  TempSrc: Oral Oral  Oral  SpO2: 100% 100% 99% 100%   Physical Exam Constitutional:      General: She is not in acute distress.    Appearance: She is normal weight. She is not toxic-appearing.  Pulmonary:     Effort: Pulmonary effort is normal. No respiratory distress.  Abdominal:     General: There is no distension.     Palpations: Abdomen is soft. There is no hepatomegaly.     Tenderness: There is abdominal tenderness in the right upper quadrant and epigastric area. There is guarding (some guarding @ epigastrium).  Skin:    General: Skin is warm and dry.  Neurological:     General: No focal deficit present.     Mental Status: She is alert and oriented to person, place, and time.    Assessment/Plan:  Active Problems:   Acute on chronic pancreatitis (HCC)  #Acute on Chronic Pancreatitis:  [name] has a extensive history of alcohol abuse complicated by pancreatitis. Lipase is only minimally elevated however given her history of chronic pancreatitis this is not surprising.  CT supports acute on chronic pancreatitis.    Vital signs continue to be stable overnight. She is still having tenderness to palpation with mild guarding, but this is improved from yesterday. Will advance diet to clears today. Discussed the importance of making sure she can tolerate PO before considering discharge. She stated she understood.   - Pain control with morphine 4 mg/mL every 4 hours as needed,  and oxycodone 5 mg every 4 hours as needed - Maintenance fluid: LR at 125 mL/h - Advance diet as tolerated: clear liquids today  #Alcohol use disorder Patient reports her last drink was more than 10 days ago. CIWA has been 0 overnight and 2 early this morning, only for nausea, which is more likely 2/2 pancreatitis. Will continue to monitor though.  - CIWA protocol  #Hypokalemia:  Resolved this AM.   #Constipation Bowel regimen in place. Patient is willing to advance to enemas if needed.   - Miralax and Senna   Dispo: Anticipated discharge pending improved PO intake.   Dr. Jose Persia Internal Medicine PGY-1  Pager: [phone] 03/23/2019, 8:46 AM

## 2019-03-23 NOTE — Plan of Care (Signed)
  Problem: Education: Goal: Knowledge of General Education information will improve Description Including pain rating scale, medication(s)/side effects and non-pharmacologic comfort measures Outcome: Progressing   

## 2019-03-24 LAB — BASIC METABOLIC PANEL
Anion gap: 10 (ref 5–15)
BUN: <5 mg/dL — ABNORMAL LOW (ref 6–20)
CO2: 25 mmol/L (ref 22–32)
Calcium: 8.9 mg/dL (ref 8.9–10.3)
Chloride: 103 mmol/L (ref 98–111)
Creatinine, Ser: 0.44 mg/dL (ref 0.44–1.00)
GFR calc Af Amer: >60 mL/min (ref >60)
GFR calc non Af Amer: >60 mL/min (ref >60)
Glucose, Bld: 94 mg/dL (ref 70–99)
Potassium: 3.6 mmol/L (ref 3.5–5.1)
Sodium: 138 mmol/L (ref 135–145)

## 2019-03-24 MED ORDER — OXYCODONE HCL 5 MG PO TABS: 5.0000 mg | ORAL_TABLET | ORAL | 0 refills | Status: AC | PRN

## 2019-03-24 MED ORDER — ONDANSETRON HCL 4 MG PO TABS: 4.0000 mg | ORAL_TABLET | Freq: Three times a day (TID) | ORAL | 0 refills | Status: DC | PRN

## 2019-03-24 NOTE — Progress Notes (Signed)
   Subjective:   Patient reports she is feeling better today, and she has been able to tolerate a liquid diet.  Feels ready to advance to regular diet.  Reports her abdominal pain is progressively improving.  Objective:  Vital signs in last 24 hours: Vitals:   03/23/19 0939 03/23/19 1705 03/23/19 2136 03/24/19 0553  BP: 140/86 119/80 124/81 113/83  Pulse: 60 (!) 58 65 67  Resp: 18 18 18 18   Temp: 98.6 F (37 C) 97.7 F (36.5 C)    TempSrc: Oral Oral    SpO2: 100% 100% 97% 100%  Weight:   51.2 kg    Physical Exam Vitals signs and nursing note reviewed.  Constitutional:      Appearance: She is normal weight.  Cardiovascular:     Rate and Rhythm: Normal rate and regular rhythm.     Heart sounds: No murmur.  Pulmonary:     Effort: Pulmonary effort is normal. No respiratory distress.  Abdominal:     General: Bowel sounds are normal.     Palpations: Abdomen is soft.     Tenderness: There is no abdominal tenderness.  Neurological:     Mental Status: She is alert.    Assessment/Plan:  Principal Problem:   Acute on chronic pancreatitis ([place])  #Acute on Chronic Pancreatitis:  Ms. [place] has a extensive history of alcohol abuse complicated by pancreatitis. Lipase is only minimally elevated however given her history of chronic pancreatitis this is not surprising. CT supportsacute on chronic pancreatitis.   Vital signs continue to be stable.  Patient has been able to tolerate a liquid diet and she does feel ready to go to a regular diet.  We discussed taking it slow and increasing feeds slowly.  We will see how she does with a full diet and if she feels ready to go later, will discuss discharge at that time.  -Pain management oxycodone -Advance diet to regular as tolerated  #Alcohol use disorder Patient reports her last drink was more than 10 days ago. CIWA has been 0.   Dispo: Anticipated discharge 0-1 days.  Dr. Jose Persia Internal Medicine PGY-1  Pager:  [phone] 03/24/2019, 6:47 AM

## 2019-03-24 NOTE — Discharge Instructions (Signed)
Acute Pancreatitis  Acute pancreatitis happens when the pancreas gets swollen. The pancreas is a large gland in the body that helps to control blood sugar. It also makes enzymes that help to digest food. This condition can last a few days and cause serious problems. The lungs, heart, and kidneys may stop working. What are the causes? Causes include:  Alcohol abuse.  Drug abuse.  Gallstones.  A tumor in the pancreas. Other causes include:  Some medicines.  Some chemicals.  Diabetes.  An infection.  Damage caused by an accident.  The poison (venom) from a scorpion bite.  Belly (abdominal) surgery.  The body's defense system (immune system) attacking the pancreas (autoimmune pancreatitis).  Genes that are passed from parent to child (inherited). In some cases, the cause is not known. What are the signs or symptoms?  Pain in the upper belly that may be felt in the back. The pain may be very bad.  Swelling of the belly.  Feeling sick to your stomach (nauseous) and throwing up (vomiting).  Fever. How is this treated? You will likely have to stay in the hospital. Treatment may include:  Pain medicine.  Fluid through an IV tube.  Placing a tube in the stomach to take out the stomach contents. This may help you stop throwing up.  Not eating for 3-4 days.  Antibiotic medicines, if you have an infection.  Treating any other problems that may be the cause.  Steroid medicines, if your problem is caused by your defense system attacking your body's own tissues.  Surgery. Follow these instructions at home: Eating and drinking   Follow instructions from your doctor about what to eat and drink.  Eat foods that do not have a lot of fat in them.  Eat small meals often. Do not eat big meals.  Drink enough fluid to keep your pee (urine) pale yellow.  Do not drink alcohol if it caused your condition. Medicines  Take over-the-counter and prescription medicines only  as told by your doctor.  Ask your doctor if the medicine prescribed to you: ? Requires you to avoid driving or using heavy machinery. ? Can cause trouble pooping (constipation). You may need to take steps to prevent or treat trouble pooping:  Take over-the-counter or prescription medicines.  Eat foods that are high in fiber. These include beans, whole grains, and fresh fruits and vegetables.  Limit foods that are high in fat and sugar. These include fried or sweet foods. General instructions  Do not use any products that contain nicotine or tobacco, such as cigarettes, e-cigarettes, and chewing tobacco. If you need help quitting, ask your doctor.  Get plenty of rest.  Check your blood sugar at home as told by your doctor.  Keep all follow-up visits as told by your doctor. This is important. Contact a doctor if:  You do not get better as quickly as expected.  You have new symptoms.  Your symptoms get worse.  You have pain or weakness that lasts a long time.  You keep feeling sick to your stomach.  You get better and then you have pain again.  You have a fever. Get help right away if:  You cannot eat or keep fluids down.  Your pain gets very bad.  Your skin or the white part of your eyes turns yellow.  You have sudden swelling in your belly.  You throw up.  You feel dizzy or you pass out (faint).  Your blood sugar is high (over 300  mg/dL). Summary  Acute pancreatitis happens when the pancreas gets swollen.  This condition is often caused by alcohol abuse, drug abuse, or gallstones.  You will likely have to stay in the hospital for treatment. This information is not intended to replace advice given to you by your health care provider. Make sure you discuss any questions you have with your health care provider. Document Released: 01/16/2008 Document Revised: 05/19/2018 Document Reviewed: 05/19/2018 Elsevier Patient Education  2020 [employer].

## 2019-03-24 NOTE — Progress Notes (Signed)
DISCHARGE NOTE HOME Felicia Perkins to be discharged Home per MD order. Discussed prescriptions and follow up appointments with the patient. Prescriptions given to patient; medication list explained in detail. Patient verbalized understanding.  Skin clean, dry and intact without evidence of skin break down, no evidence of skin tears noted. IV catheter discontinued intact. Site without signs and symptoms of complications. Dressing and pressure applied. Pt denies pain at the site currently. No complaints noted.  Patient free of lines, drains, and wounds.   An After Visit Summary (AVS) was printed and given to the patient. Patient escorted via wheelchair, and discharged home via private auto.  Orville Govern, RN3

## 2019-03-24 NOTE — Discharge Summary (Signed)
Name: [name] MRN: [id number] DOB: 09-24-1975 43 y.o. PCP: Charlott Rakes, MD  Date of Admission: 03/22/2019  8:54 AM Date of Discharge: 03/24/2019 Attending Physician: Lucious Groves, DO  Discharge Diagnosis: Acute on chronic pancreatitis  Discharge Medications: Allergies as of 03/24/2019   No Known Allergies     Medication List    TAKE these medications   albuterol 108 (90 Base) MCG/ACT inhaler Commonly known as: VENTOLIN HFA Inhale 1-2 puffs into the lungs every 6 (six) hours as needed for wheezing or shortness of breath.   aluminum-magnesium hydroxide 200-200 MG/5ML suspension Take 5 mLs by mouth every 6 (six) hours as needed for indigestion.   buPROPion 150 MG 12 hr tablet Commonly known as: WELLBUTRIN SR Take 1 tablet (150 mg total) by mouth 2 (two) times daily. For smoking cessation   cloNIDine 0.1 MG tablet Commonly known as: CATAPRES Take 1 tablet (0.1 mg total) by mouth at bedtime. For hot flashes   hydrocortisone 2.5 % rectal cream Commonly known as: ANUSOL-HC Place 1 application rectally at bedtime. Use in rectum for 7 days.   mometasone-formoterol 200-5 MCG/ACT Aero Commonly known as: DULERA Inhale 2 puffs into the lungs 2 (two) times daily.   naltrexone 50 MG tablet Commonly known as: DEPADE Take 1 tablet (50 mg total) by mouth daily.   nicotine 21 mg/24hr patch Commonly known as: NICODERM CQ - dosed in mg/24 hours Place 1 patch (21 mg total) onto the skin daily.   ondansetron 4 MG tablet Commonly known as: ZOFRAN Take 1 tablet (4 mg total) by mouth every 8 (eight) hours as needed for nausea or vomiting.   ONE-A-DAY WOMENS PO Take 1 tablet by mouth daily.   oxyCODONE 5 MG immediate release tablet Commonly known as: Oxy IR/ROXICODONE Take 1 tablet (5 mg total) by mouth every 4 (four) hours as needed for up to 5 days for moderate pain.   pantoprazole 40 MG tablet Commonly known as: PROTONIX Take 1 tablet (40 mg total) by mouth  daily.   triamcinolone cream 0.1 % Commonly known as: KENALOG Apply 1 application topically 2 (two) times daily.     Disposition and follow-up:   Ms.Markitta ARYANNA SHAVER was discharged from [facility] in Stable condition.  At the hospital follow up visit please address:  1.   Alcohol Use  Acute on chronic pancreatitis  - Continue to encourage alcohol cessation   2.  Labs / imaging needed at time of follow-up: None  3.  Pending labs/ test needing follow-up: None  Follow-up Appointments: Follow-up Information    Charlott Rakes, MD. Call.   Specialty: Family Medicine Contact information: [place] [address] [place] Hospital Course by problem list:  Acute on chronic pancreatitis. Felicia Perkins is a 43 y.o female with alcoholic cirrhosis, alcohol use disorder, and chronic pancreatitis who presented to the ED on 03/22/19 with abdominal pain. She described her abdominal pain with submitted epigastric and radiating through to the back. Her lipase was only minimally elevated; however, CT scan of the abdomen illustrated findings consistent with acute on chronic pancreatitis. She was admitted for further evaluation and management. She was initially placed on NPO however over the course of her hospitalization her diet was advanced and she was able to tolerate PO intake. She was discharged with a short course of oxycodone for her abdominal pain. The cause of her acute on chronic pancreatitis was likely secondary to  alcohol use. She was advised to abstain from further alcohol use. She voices understanding. She will follow-up with her primary care doctor.   Discharge Vitals:   BP 106/79 (BP Location: Right Arm)   Pulse (!) 59   Temp 98.3 F (36.8 C) (Oral)   Resp 18   Wt 51.2 kg   SpO2 100%   BMI 19.38 kg/m   Discharge Instructions: Discharge Instructions    Call MD for:  persistant nausea and vomiting   Complete by: As directed     Diet - low sodium heart healthy   Complete by: As directed    Discharge instructions   Complete by: As directed    Thank you for allowing [place] to provide your care. Please schedule an appointment with your primary care doctor as soon as you can. Take all your medications as prescribed. If you start to have worsening abdominal pain, persistent nausea or vomiting, or fevers please come back to the hospital immediately.   Increase activity slowly   Complete by: As directed     Signed: Jose Persia, MD 03/24/2019, 4:57 PM

## 2019-04-17 ENCOUNTER — Other Ambulatory Visit: Payer: Self-pay | Admitting: Family Medicine

## 2019-04-17 ENCOUNTER — Encounter: Payer: Self-pay | Admitting: Family Medicine

## 2019-04-17 MED ORDER — PREDNISONE 20 MG PO TABS: 20.0000 mg | ORAL_TABLET | Freq: Every day | ORAL | 0 refills | Status: DC

## 2019-06-29 ENCOUNTER — Encounter: Payer: Self-pay | Admitting: Family Medicine

## 2019-06-30 ENCOUNTER — Emergency Department (HOSPITAL_COMMUNITY)
Admission: EM | Admit: 2019-06-30 | Discharge: 2019-06-30 | Disposition: A | Discharge status: Home / Self Care | Payer: Medicaid Other | Attending: Emergency Medicine | Admitting: Emergency Medicine

## 2019-06-30 ENCOUNTER — Encounter (HOSPITAL_COMMUNITY): Payer: Self-pay | Admitting: Emergency Medicine

## 2019-06-30 ENCOUNTER — Encounter: Payer: Self-pay | Admitting: Family Medicine

## 2019-06-30 DIAGNOSIS — R109 Unspecified abdominal pain: Secondary | ICD-10-CM | POA: Insufficient documentation

## 2019-06-30 DIAGNOSIS — Z5321 Procedure and treatment not carried out due to patient leaving prior to being seen by health care provider: Secondary | ICD-10-CM | POA: Insufficient documentation

## 2019-06-30 LAB — I-STAT BETA HCG BLOOD, ED (MC, WL, AP ONLY): I-stat hCG, quantitative: <5 m[IU]/mL (ref <5)

## 2019-06-30 LAB — COMPREHENSIVE METABOLIC PANEL
ALT: 27 U/L (ref 0–44)
AST: 47 U/L — ABNORMAL HIGH (ref 15–41)
Albumin: 4.2 g/dL (ref 3.5–5.0)
Alkaline Phosphatase: 93 U/L (ref 38–126)
Anion gap: 15 (ref 5–15)
BUN: <5 mg/dL — ABNORMAL LOW (ref 6–20)
CO2: 22 mmol/L (ref 22–32)
Calcium: 10.2 mg/dL (ref 8.9–10.3)
Chloride: 95 mmol/L — ABNORMAL LOW (ref 98–111)
Creatinine, Ser: 0.46 mg/dL (ref 0.44–1.00)
GFR calc Af Amer: >60 mL/min (ref >60)
GFR calc non Af Amer: >60 mL/min (ref >60)
Glucose, Bld: 114 mg/dL — ABNORMAL HIGH (ref 70–99)
Potassium: 3.8 mmol/L (ref 3.5–5.1)
Sodium: 132 mmol/L — ABNORMAL LOW (ref 135–145)
Total Bilirubin: 1.6 mg/dL — ABNORMAL HIGH (ref 0.3–1.2)
Total Protein: 7.6 g/dL (ref 6.5–8.1)

## 2019-06-30 LAB — CBC
HCT: 40.9 % (ref 36.0–46.0)
Hemoglobin: 14.4 g/dL (ref 12.0–15.0)
MCH: 34 pg (ref 26.0–34.0)
MCHC: 35.2 g/dL (ref 30.0–36.0)
MCV: 96.5 fL (ref 80.0–100.0)
Platelets: 179 10*3/uL (ref 150–400)
RBC: 4.24 MIL/uL (ref 3.87–5.11)
RDW: 12.7 % (ref 11.5–15.5)
WBC: 5.9 10*3/uL (ref 4.0–10.5)
nRBC: 0 % (ref 0.0–0.2)

## 2019-06-30 LAB — LIPASE, BLOOD: Lipase: 50 U/L (ref 11–51)

## 2019-06-30 NOTE — ED Notes (Signed)
Pt sts she has a dentist appointment this morning and must leave.

## 2019-06-30 NOTE — ED Triage Notes (Signed)
Pt to ER for evaluation of epigastric abdominal pain onset Sunday after ETOH consumption and hot wings. She reports history of pancreatitis. Also reports dark stools. Does report taking pepto "like it's candy." +nausea and vomiting.

## 2019-07-14 ENCOUNTER — Other Ambulatory Visit: Payer: Self-pay

## 2019-07-14 ENCOUNTER — Encounter: Payer: Self-pay | Admitting: Family Medicine

## 2019-07-14 DIAGNOSIS — F1010 Alcohol abuse, uncomplicated: Secondary | ICD-10-CM

## 2019-07-14 DIAGNOSIS — K7030 Alcoholic cirrhosis of liver without ascites: Secondary | ICD-10-CM

## 2019-07-14 MED ORDER — PANTOPRAZOLE SODIUM 40 MG PO TBEC: 40.0000 mg | DELAYED_RELEASE_TABLET | Freq: Every day | ORAL | 3 refills | Status: DC

## 2019-07-15 ENCOUNTER — Encounter: Payer: Self-pay | Admitting: Family Medicine

## 2019-07-20 ENCOUNTER — Ambulatory Visit: Payer: Medicaid Other

## 2019-07-20 ENCOUNTER — Ambulatory Visit (HOSPITAL_COMMUNITY)
Admission: EM | Admit: 2019-07-20 | Discharge: 2019-07-20 | Disposition: A | Discharge status: Home / Self Care | Payer: Medicaid Other | Attending: Emergency Medicine | Admitting: Emergency Medicine

## 2019-07-20 ENCOUNTER — Encounter (HOSPITAL_COMMUNITY): Payer: Self-pay

## 2019-07-20 ENCOUNTER — Other Ambulatory Visit: Payer: Self-pay

## 2019-07-20 ENCOUNTER — Encounter: Payer: Self-pay | Admitting: Family Medicine

## 2019-07-20 DIAGNOSIS — R1013 Epigastric pain: Secondary | ICD-10-CM | POA: Diagnosis not present

## 2019-07-20 LAB — CBC WITH DIFFERENTIAL/PLATELET
Abs Immature Granulocytes: 0.02 10*3/uL (ref 0.00–0.07)
Basophils Absolute: 0.1 10*3/uL (ref 0.0–0.1)
Basophils Relative: 2 %
Eosinophils Absolute: 0.3 10*3/uL (ref 0.0–0.5)
Eosinophils Relative: 3 %
HCT: 46.5 % — ABNORMAL HIGH (ref 36.0–46.0)
Hemoglobin: 15.9 g/dL — ABNORMAL HIGH (ref 12.0–15.0)
Immature Granulocytes: 0 %
Lymphocytes Relative: 50 %
Lymphs Abs: 3.8 10*3/uL (ref 0.7–4.0)
MCH: 33.3 pg (ref 26.0–34.0)
MCHC: 34.2 g/dL (ref 30.0–36.0)
MCV: 97.5 fL (ref 80.0–100.0)
Monocytes Absolute: 1 10*3/uL (ref 0.1–1.0)
Monocytes Relative: 13 %
Neutro Abs: 2.4 10*3/uL (ref 1.7–7.7)
Neutrophils Relative %: 32 %
Platelets: 197 10*3/uL (ref 150–400)
RBC: 4.77 MIL/uL (ref 3.87–5.11)
RDW: 12.3 % (ref 11.5–15.5)
WBC: 7.6 10*3/uL (ref 4.0–10.5)
nRBC: 0 % (ref 0.0–0.2)

## 2019-07-20 LAB — COMPREHENSIVE METABOLIC PANEL
ALT: 16 U/L (ref 0–44)
AST: 26 U/L (ref 15–41)
Albumin: 4.1 g/dL (ref 3.5–5.0)
Alkaline Phosphatase: 95 U/L (ref 38–126)
Anion gap: 11 (ref 5–15)
BUN: <5 mg/dL — ABNORMAL LOW (ref 6–20)
CO2: 25 mmol/L (ref 22–32)
Calcium: 9.9 mg/dL (ref 8.9–10.3)
Chloride: 98 mmol/L (ref 98–111)
Creatinine, Ser: 0.54 mg/dL (ref 0.44–1.00)
GFR calc Af Amer: >60 mL/min (ref >60)
GFR calc non Af Amer: >60 mL/min (ref >60)
Glucose, Bld: 87 mg/dL (ref 70–99)
Potassium: 4.3 mmol/L (ref 3.5–5.1)
Sodium: 134 mmol/L — ABNORMAL LOW (ref 135–145)
Total Bilirubin: 1.4 mg/dL — ABNORMAL HIGH (ref 0.3–1.2)
Total Protein: 7.2 g/dL (ref 6.5–8.1)

## 2019-07-20 LAB — LIPASE, BLOOD: Lipase: 48 U/L (ref 11–51)

## 2019-07-20 LAB — AMYLASE: Amylase: 69 U/L (ref 28–100)

## 2019-07-20 MED ORDER — LIDOCAINE VISCOUS HCL 2 % MT SOLN
OROMUCOSAL | Status: AC
  Filled 2019-07-20: qty 15

## 2019-07-20 MED ORDER — FAMOTIDINE 20 MG PO TABS
ORAL_TABLET | ORAL | Status: AC
  Filled 2019-07-20: qty 1

## 2019-07-20 MED ORDER — DOCUSATE SODIUM 100 MG PO CAPS: 100.0000 mg | ORAL_CAPSULE | Freq: Two times a day (BID) | ORAL | 0 refills | Status: DC

## 2019-07-20 MED ORDER — FAMOTIDINE 20 MG PO TABS: 20.0000 mg | ORAL_TABLET | Freq: Two times a day (BID) | ORAL | 0 refills | Status: DC

## 2019-07-20 MED ORDER — FAMOTIDINE 20 MG PO TABS
20.0000 mg | ORAL_TABLET | Freq: Once | ORAL | Status: AC
  Administered 2019-07-20: 20 mg via ORAL

## 2019-07-20 MED ORDER — ALUM & MAG HYDROXIDE-SIMETH 200-200-20 MG/5ML PO SUSP
30.0000 mL | Freq: Once | ORAL | Status: AC
  Administered 2019-07-20: 19:00:00 30 mL via ORAL

## 2019-07-20 MED ORDER — ALUM & MAG HYDROXIDE-SIMETH 200-200-20 MG/5ML PO SUSP
ORAL | Status: AC
  Filled 2019-07-20: qty 30

## 2019-07-20 MED ORDER — LIDOCAINE VISCOUS HCL 2 % MT SOLN
15.0000 mL | Freq: Once | OROMUCOSAL | Status: AC
  Administered 2019-07-20: 15 mL via ORAL

## 2019-07-20 NOTE — ED Provider Notes (Signed)
[place]    CSN: [id number] Arrival date & time: 07/20/19  1714      History   Chief Complaint Chief Complaint  Patient presents with  . Abdominal Pain    HPI Felicia Perkins is a 43 y.o. female.   Felicia Perkins presents with complaints of epigastric abdominal pain which radiates to back. Started three days ago. Had drank a few beers the night before onset as well as ate hot wings. Has had two episodes of non bloody emesis, last was this morning. Has been constipated, no diarrhea. No fevers. Has been drinking water. Still taking protonix 40mg  daily. No other medications currently. No other URI symptoms. No fevers. History  Of alchol abuse, gerd, pancreatitis,  gastritis. Hospitalized in august for chronic pancreatitis.     ROS per HPI, negative if not otherwise mentioned.      Past Medical History:  Diagnosis Date  . Alcohol abuse   . Anxiety   . Bipolar 1 disorder ([place])   . Cancer (HCC)    cervical cancer  . GERD (gastroesophageal reflux disease)   . Pancreatitis   . PTSD (post-traumatic stress disorder)     Patient Active Problem List   Diagnosis Date Noted  . Acute on chronic pancreatitis ([place]) 03/22/2019  . Bipolar 1 disorder ([place]) 01/13/2019  . Eczema 10/09/2018  . Acute pancreatitis   . Alcohol induced acute pancreatitis 11/22/2017  . Duodenitis 11/22/2017  . Tobacco abuse counseling   . 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 Nondisplaced fracture of neck of fifth metacarpal bone, right hand, initial encounter for closed fracture 08/26/2017  . Alcoholic cirrhosis of liver without ascites ([place]) 10/15/2016  . Alcohol abuse 09/04/2016  . Gastritis and gastroduodenitis 09/04/2016  . Tobacco abuse 09/04/2016  . COPD (chronic obstructive pulmonary disease) ([place]) 09/04/2016  . Elevated liver enzymes 12/22/2014  . Abdominal pain, epigastric 12/16/2014  . Chest pain with moderate risk for cardiac etiology 12/15/2014  . Alcohol dependence ([place])  12/16/2012    Class: Acute    Past Surgical History:  Procedure Laterality Date  . ABDOMINAL HYSTERECTOMY  2004  . BIOPSY  01/02/2018   Procedure: BIOPSY;  Surgeon: Milus Banister, MD;  Location: [facility];  Service: Endoscopy;;  . ESOPHAGOGASTRODUODENOSCOPY (EGD) WITH PROPOFOL N/A 01/02/2018   Procedure: ESOPHAGOGASTRODUODENOSCOPY (EGD) WITH PROPOFOL;  Surgeon: Milus Banister, MD;  Location: [facility];  Service: Endoscopy;  Laterality: N/A;  . EUS N/A 01/02/2018   Procedure: UPPER ENDOSCOPIC ULTRASOUND (EUS) RADIAL;  Surgeon: Milus Banister, MD;  Location: [facility];  Service: Endoscopy;  Laterality: N/A;  . FOOT SURGERY    . OTHER SURGICAL HISTORY     ovarian surgery  . TUBAL LIGATION      OB History   No obstetric history on file.      Home Medications    Prior to Admission medications   Medication Sig Start Date End Date Taking? Authorizing Provider  albuterol (VENTOLIN HFA) 108 (90 Base) MCG/ACT inhaler Inhale 1-2 puffs into the lungs every 6 (six) hours as needed for wheezing or shortness of breath. 01/13/19   Charlott Rakes, MD  aluminum-magnesium hydroxide 200-200 MG/5ML suspension Take 5 mLs by mouth every 6 (six) hours as needed for indigestion. Patient not taking: Reported on 03/22/2019 01/02/19   Loura Halt A, NP  buPROPion (WELLBUTRIN SR) 150 MG 12 hr tablet Take 1 tablet (150 mg total) by mouth 2 (two) times daily. For smoking cessation Patient not taking: Reported on 03/22/2019  01/13/19   Charlott Rakes, MD  cloNIDine (CATAPRES) 0.1 MG tablet Take 1 tablet (0.1 mg total) by mouth at bedtime. For hot flashes 01/13/19   Charlott Rakes, MD  docusate sodium (COLACE) 100 MG capsule Take 1 capsule (100 mg total) by mouth every 12 (twelve) hours. 07/20/19   Zigmund Gottron, NP  famotidine (PEPCID) 20 MG tablet Take 1 tablet (20 mg total) by mouth 2 (two) times daily. 07/20/19   Zigmund Gottron, NP  hydrocortisone (ANUSOL-HC) 2.5 % rectal cream Place 1 application  rectally at bedtime. Use in rectum for 7 days. Patient not taking: Reported on 03/22/2019 12/11/17   Willia Craze, NP  mometasone-formoterol [facility]) 200-5 MCG/ACT AERO Inhale 2 puffs into the lungs 2 (two) times daily. 01/13/19   Charlott Rakes, MD  Multiple Vitamins-Calcium (ONE-A-DAY WOMENS PO) Take 1 tablet by mouth daily.    [provider]  naltrexone (DEPADE) 50 MG tablet Take 1 tablet (50 mg total) by mouth daily. Patient not taking: Reported on 03/22/2019 01/13/19   Charlott Rakes, MD  nicotine (NICODERM CQ - DOSED IN MG/24 HOURS) 21 mg/24hr patch Place 1 patch (21 mg total) onto the skin daily. 06/04/18   Charlott Rakes, MD  ondansetron (ZOFRAN) 4 MG tablet Take 1 tablet (4 mg total) by mouth every 8 (eight) hours as needed for nausea or vomiting. 03/24/19   Ina Homes, MD  pantoprazole (PROTONIX) 40 MG tablet Take 1 tablet (40 mg total) by mouth daily. 07/14/19   Charlott Rakes, MD  predniSONE (DELTASONE) 20 MG tablet Take 1 tablet (20 mg total) by mouth daily with breakfast. 04/17/19   Charlott Rakes, MD  triamcinolone cream (KENALOG) 0.1 % Apply 1 application topically 2 (two) times daily. 10/09/18   Charlott Rakes, MD    Family History Family History  Problem Relation Age of Onset  . Hypertension Mother   . Breast cancer Mother 61  . Pancreatitis Mother   . Hypertension Father   . Heart attack Cousin 30       died in 45s of MI  . Breast cancer Maternal Aunt   . Breast cancer Maternal Aunt   . Colon cancer Neg Hx     Social History Social History   Tobacco Use  . Smoking status: Current Every Day Smoker    Packs/day: 0.50    Types: Cigarettes  . Smokeless tobacco: Never Used  . Tobacco comment: 2-3 cigarettes daily  Substance Use Topics  . Alcohol use: Not Currently    Comment: no alchol for the past 3 weeks  . Drug use: No     Allergies   Patient has no known allergies.   Review of Systems Review of Systems   Physical Exam Triage Vital Signs  ED Triage Vitals [07/20/19 1800]  Enc Vitals Group     BP (!) 163/89     Pulse Rate 70     Resp 16     Temp 98.3 F (36.8 C)     Temp Source Oral     SpO2 100 %     Weight      Height      Head Circumference      Peak Flow      Pain Score 7     Pain Loc      Pain Edu?      Excl. in [place]?    No data found.  Updated Vital Signs BP (!) 163/89 (BP Location: Right Arm)   Pulse 70  Temp 98.3 F (36.8 C) (Oral)   Resp 16   SpO2 100%    Physical Exam Constitutional:      General: She is not in acute distress.    Appearance: She is well-developed.  Cardiovascular:     Rate and Rhythm: Normal rate.  Pulmonary:     Effort: Pulmonary effort is normal.  Abdominal:     Palpations: Abdomen is soft.     Tenderness: There is abdominal tenderness in the right upper quadrant and epigastric area.  Skin:    General: Skin is warm and dry.  Neurological:     Mental Status: She is alert and oriented to person, place, and time.      UC Treatments / Results  Labs (all labs ordered are listed, but only abnormal results are displayed) Labs Reviewed  CBC WITH DIFFERENTIAL/PLATELET  COMPREHENSIVE METABOLIC PANEL  LIPASE, BLOOD  AMYLASE    EKG   Radiology No results found.  Procedures Procedures (including critical care time)  Medications Ordered in UC Medications  alum & mag hydroxide-simeth (MAALOX/MYLANTA) 200-200-20 MG/5ML suspension 30 mL (30 mLs Oral Given 07/20/19 1901)    And  lidocaine (XYLOCAINE) 2 % viscous mouth solution 15 mL (15 mLs Oral Given 07/20/19 1902)  famotidine (PEPCID) tablet 20 mg (20 mg Oral Given 07/20/19 1902)  famotidine (PEPCID) 20 MG tablet (has no administration in time range)  alum & mag hydroxide-simeth (MAALOX/MYLANTA) 200-200-20 MG/5ML suspension (has no administration in time range)  lidocaine (XYLOCAINE) 2 % viscous mouth solution (has no administration in time range)    Initial Impression / Assessment and Plan / UC Course  I have  reviewed the triage vital signs and the nursing notes.  Pertinent labs & imaging results that were available during my care of the patient were reviewed by me and considered in my medical decision making (see chart for details).     Afebrile, no tachcyardia. No active nausea or vomiting. Epigastric pain which radiates. Gi cocktail and pepcid provided with labs pending. Diet recommendations discussed. Er precautions provided. Patient verbalized understanding and agreeable to plan.   Final Clinical Impressions(s) / UC Diagnoses   Final diagnoses:  Abdominal pain, epigastric     Discharge Instructions     Drink plenty of water to promote bowel movement.  Take colace 1-2 times a day.  Pepcid at night, continue with your daily protonix.  We will call you if your labs return concerning.  Limit alcohol and fattening foods to prevent flares of this.  Please follow up with your primary care provider for persistent symptoms.  If worsening please go to the ER.    ED Prescriptions    Medication Sig Dispense Auth. Provider   famotidine (PEPCID) 20 MG tablet Take 1 tablet (20 mg total) by mouth 2 (two) times daily. 30 tablet Augusto Gamble B, NP   docusate sodium (COLACE) 100 MG capsule Take 1 capsule (100 mg total) by mouth every 12 (twelve) hours. 60 capsule Zigmund Gottron, NP     PDMP not reviewed this encounter.   Zigmund Gottron, NP 07/20/19 1930

## 2019-07-20 NOTE — ED Triage Notes (Signed)
Pt present upper abdomen pain with some vomiting and diarrhea.

## 2019-07-20 NOTE — Discharge Instructions (Signed)
Drink plenty of water to promote bowel movement.  Take colace 1-2 times a day.  Pepcid at night, continue with your daily protonix.  We will call you if your labs return concerning.  Limit alcohol and fattening foods to prevent flares of this.  Please follow up with your primary care provider for persistent symptoms.  If worsening please go to the ER.

## 2019-07-21 ENCOUNTER — Encounter: Payer: Self-pay | Admitting: Family Medicine

## 2019-07-30 ENCOUNTER — Encounter: Payer: Self-pay | Admitting: Family Medicine

## 2019-07-31 ENCOUNTER — Other Ambulatory Visit: Payer: Self-pay | Admitting: Pharmacist

## 2019-07-31 ENCOUNTER — Telehealth: Payer: Self-pay

## 2019-07-31 MED ORDER — FAMOTIDINE 20 MG PO TABS: 20.0000 mg | ORAL_TABLET | Freq: Two times a day (BID) | ORAL | 2 refills | Status: DC

## 2019-07-31 NOTE — Telephone Encounter (Signed)
Patient is requesting Famotidine instead of Protonix.

## 2019-07-31 NOTE — Telephone Encounter (Signed)
Patient is requesting a refill on Famotidine I don't see it on her medication list.

## 2019-07-31 NOTE — Telephone Encounter (Signed)
She is on Protonix and should not be on Famotidine as well.

## 2019-08-09 ENCOUNTER — Encounter: Payer: Self-pay | Admitting: Family Medicine

## 2019-08-22 ENCOUNTER — Encounter: Payer: Self-pay | Admitting: Family Medicine

## 2019-08-25 ENCOUNTER — Other Ambulatory Visit: Payer: Self-pay

## 2019-08-25 ENCOUNTER — Ambulatory Visit: Payer: Medicaid Other | Attending: Family Medicine | Admitting: Family Medicine

## 2019-08-25 ENCOUNTER — Encounter: Payer: Self-pay | Admitting: Family Medicine

## 2019-08-25 VITALS — BP 130/85 | HR 85 | Wt 101.0 lb

## 2019-08-25 DIAGNOSIS — F1010 Alcohol abuse, uncomplicated: Secondary | ICD-10-CM | POA: Diagnosis not present

## 2019-08-25 DIAGNOSIS — Z8541 Personal history of malignant neoplasm of cervix uteri: Secondary | ICD-10-CM | POA: Insufficient documentation

## 2019-08-25 DIAGNOSIS — K219 Gastro-esophageal reflux disease without esophagitis: Secondary | ICD-10-CM | POA: Insufficient documentation

## 2019-08-25 DIAGNOSIS — L308 Other specified dermatitis: Secondary | ICD-10-CM | POA: Diagnosis not present

## 2019-08-25 DIAGNOSIS — L309 Dermatitis, unspecified: Secondary | ICD-10-CM | POA: Diagnosis not present

## 2019-08-25 DIAGNOSIS — Z79899 Other long term (current) drug therapy: Secondary | ICD-10-CM | POA: Diagnosis not present

## 2019-08-25 DIAGNOSIS — Z7951 Long term (current) use of inhaled steroids: Secondary | ICD-10-CM | POA: Diagnosis not present

## 2019-08-25 DIAGNOSIS — K7030 Alcoholic cirrhosis of liver without ascites: Secondary | ICD-10-CM | POA: Diagnosis not present

## 2019-08-25 DIAGNOSIS — Z7952 Long term (current) use of systemic steroids: Secondary | ICD-10-CM | POA: Diagnosis not present

## 2019-08-25 DIAGNOSIS — F17210 Nicotine dependence, cigarettes, uncomplicated: Secondary | ICD-10-CM | POA: Insufficient documentation

## 2019-08-25 DIAGNOSIS — G621 Alcoholic polyneuropathy: Secondary | ICD-10-CM | POA: Insufficient documentation

## 2019-08-25 DIAGNOSIS — F319 Bipolar disorder, unspecified: Secondary | ICD-10-CM | POA: Diagnosis not present

## 2019-08-25 DIAGNOSIS — J449 Chronic obstructive pulmonary disease, unspecified: Secondary | ICD-10-CM | POA: Diagnosis present

## 2019-08-25 DIAGNOSIS — Z23 Encounter for immunization: Secondary | ICD-10-CM

## 2019-08-25 DIAGNOSIS — Z131 Encounter for screening for diabetes mellitus: Secondary | ICD-10-CM

## 2019-08-25 DIAGNOSIS — Z0000 Encounter for general adult medical examination without abnormal findings: Secondary | ICD-10-CM

## 2019-08-25 LAB — POCT GLYCOSYLATED HEMOGLOBIN (HGB A1C): HbA1c, POC (controlled diabetic range): 4.6 % (ref 0.0–7.0)

## 2019-08-25 MED ORDER — PREDNISONE 20 MG PO TABS: 20.0000 mg | ORAL_TABLET | Freq: Every day | ORAL | 0 refills | Status: DC

## 2019-08-25 MED ORDER — TRIAMCINOLONE ACETONIDE 0.1 % EX CREA: 1.0000 "application " | TOPICAL_CREAM | Freq: Two times a day (BID) | CUTANEOUS | 1 refills | Status: DC

## 2019-08-25 MED ORDER — GABAPENTIN 300 MG PO CAPS: 300.0000 mg | ORAL_CAPSULE | Freq: Every day | ORAL | 6 refills | Status: DC

## 2019-08-25 NOTE — Progress Notes (Signed)
Subjective:  Patient ID: Felicia Perkins, female    DOB: 01-06-1976  Age: 44 y.o. MRN: [id number]  CC: COPD   HPI Felicia Perkins is a 44 year old female with a history of alcoholic liver cirrhosis, COPD, alcohol abuse, tobacco abuse who presents today for a follow-up visit  She now works at [employer] which has her busy and has caused her to cut down on her drinking; she drinks about a 40 oz beer/ day. Smokes 4-5 cig/day which is a reduction from previous. Would like "blood work to check her kidneys"  Since she restarted Miralax she has not had stomach pains which were previously epigatsric and she states she was unaware she was constipated and having to strain.  She is chronically on Pepcid. Complains of numbness in right arm and can't feel the tips of her fingers of the R hand; this has been on for the last few weeks to a month. She also complains of bony protrusion in R hand with no previous history of trauma. Eczema in elbow has flared up despite compliance with triamcinolone and she would like a short course of prednisone which has worked in the past.  Past Medical History:  Diagnosis Date  . Alcohol abuse   . Anxiety   . Bipolar 1 disorder ([place])   . Cancer (HCC)    cervical cancer  . GERD (gastroesophageal reflux disease)   . Pancreatitis   . PTSD (post-traumatic stress disorder)     Past Surgical History:  Procedure Laterality Date  . ABDOMINAL HYSTERECTOMY  2004  . BIOPSY  01/02/2018   Procedure: BIOPSY;  Surgeon: Milus Banister, MD;  Location: [facility];  Service: Endoscopy;;  . ESOPHAGOGASTRODUODENOSCOPY (EGD) WITH PROPOFOL N/A 01/02/2018   Procedure: ESOPHAGOGASTRODUODENOSCOPY (EGD) WITH PROPOFOL;  Surgeon: Milus Banister, MD;  Location: [facility];  Service: Endoscopy;  Laterality: N/A;  . EUS N/A 01/02/2018   Procedure: UPPER ENDOSCOPIC ULTRASOUND (EUS) RADIAL;  Surgeon: Milus Banister, MD;  Location: [facility];  Service: Endoscopy;  Laterality: N/A;    . FOOT SURGERY    . OTHER SURGICAL HISTORY     ovarian surgery  . TUBAL LIGATION      Family History  Problem Relation Age of Onset  . Hypertension Mother   . Breast cancer Mother 41  . Pancreatitis Mother   . Hypertension Father   . Heart attack Cousin 30       died in 54s of MI  . Breast cancer Maternal Aunt   . Breast cancer Maternal Aunt   . Colon cancer Neg Hx     No Known Allergies  Outpatient Medications Prior to Visit  Medication Sig Dispense Refill  . albuterol (VENTOLIN HFA) 108 (90 Base) MCG/ACT inhaler Inhale 1-2 puffs into the lungs every 6 (six) hours as needed for wheezing or shortness of breath. 1 Inhaler 3  . cloNIDine (CATAPRES) 0.1 MG tablet Take 1 tablet (0.1 mg total) by mouth at bedtime. For hot flashes 30 tablet 3  . docusate sodium (COLACE) 100 MG capsule Take 1 capsule (100 mg total) by mouth every 12 (twelve) hours. 60 capsule 0  . famotidine (PEPCID) 20 MG tablet Take 1 tablet (20 mg total) by mouth 2 (two) times daily. 60 tablet 2  . mometasone-formoterol (DULERA) 200-5 MCG/ACT AERO Inhale 2 puffs into the lungs 2 (two) times daily. 1 Inhaler 3  . Multiple Vitamins-Calcium (ONE-A-DAY WOMENS PO) Take 1 tablet by mouth daily.    Marland Kitchen triamcinolone cream (KENALOG)  0.1 % Apply 1 application topically 2 (two) times daily. 30 g 1  . aluminum-magnesium hydroxide 200-200 MG/5ML suspension Take 5 mLs by mouth every 6 (six) hours as needed for indigestion. (Patient not taking: Reported on 03/22/2019) 355 mL 0  . buPROPion (WELLBUTRIN SR) 150 MG 12 hr tablet Take 1 tablet (150 mg total) by mouth 2 (two) times daily. For smoking cessation (Patient not taking: Reported on 03/22/2019) 60 tablet 3  . hydrocortisone (ANUSOL-HC) 2.5 % rectal cream Place 1 application rectally at bedtime. Use in rectum for 7 days. (Patient not taking: Reported on 03/22/2019) 30 g 1  . naltrexone (DEPADE) 50 MG tablet Take 1 tablet (50 mg total) by mouth daily. (Patient not taking: Reported on  03/22/2019) 30 tablet 3  . nicotine (NICODERM CQ - DOSED IN MG/24 HOURS) 21 mg/24hr patch Place 1 patch (21 mg total) onto the skin daily. (Patient not taking: Reported on 08/25/2019) 28 patch 3  . ondansetron (ZOFRAN) 4 MG tablet Take 1 tablet (4 mg total) by mouth every 8 (eight) hours as needed for nausea or vomiting. 20 tablet 0  . predniSONE (DELTASONE) 20 MG tablet Take 1 tablet (20 mg total) by mouth daily with breakfast. 5 tablet 0   No facility-administered medications prior to visit.     ROS Review of Systems  Constitutional: Negative for activity change, appetite change and fatigue.  HENT: Negative for congestion, sinus pressure and sore throat.   Eyes: Negative for visual disturbance.  Respiratory: Negative for cough, chest tightness, shortness of breath and wheezing.   Cardiovascular: Negative for chest pain and palpitations.  Gastrointestinal: Negative for abdominal distention, abdominal pain and constipation.  Endocrine: Negative for polydipsia.  Genitourinary: Negative for dysuria and frequency.  Musculoskeletal: Negative for arthralgias and back pain.  Skin: Positive for rash.  Neurological: Positive for numbness. Negative for tremors and light-headedness.  Hematological: Does not bruise/bleed easily.  Psychiatric/Behavioral: Negative for agitation and behavioral problems.    Objective:  BP 130/85   Pulse 85   Wt 101 lb (45.8 kg)   SpO2 98%   BMI 17.34 kg/m   BP/Weight 08/25/2019 07/20/2019 [id number]  Systolic BP [id number]  Diastolic BP 85 89 [id number]  Wt. (Lbs) 101 - -  BMI 17.34 - -  Some encounter information is confidential and restricted. Go to Review Flowsheets activity to see all data.      Physical Exam Constitutional:      Appearance: She is well-developed.  Neck:     Vascular: No JVD.  Cardiovascular:     Rate and Rhythm: Normal rate.     Heart sounds: Normal heart sounds. No murmur.  Pulmonary:     Effort: Pulmonary effort is normal.      Breath sounds: Normal breath sounds. No wheezing or rales.  Chest:     Chest wall: No tenderness.  Abdominal:     General: Bowel sounds are normal. There is no distension.     Palpations: Abdomen is soft. There is no mass.     Tenderness: There is no abdominal tenderness.  Musculoskeletal:        General: Normal range of motion.     Right lower leg: No edema.     Left lower leg: No edema.     Comments: Negative Phalen and Tinel sign bilaterally  Skin:    Comments: Eczematous rash on elbows, dorsum of hand  Neurological:     Mental Status: She is alert and oriented to person, place,  and time.  Psychiatric:        Mood and Affect: Mood normal.     CMP Latest Ref Rng & Units 07/20/2019 06/30/2019 03/24/2019  Glucose 70 - 99 mg/dL 87 114(H) 94  BUN 6 - 20 mg/dL <5(L) <5(L) <5(L)  Creatinine 0.44 - 1.00 mg/dL 0.54 0.46 0.44  Sodium 135 - 145 mmol/L 134(L) 132(L) 138  Potassium 3.5 - 5.1 mmol/L 4.3 3.8 3.6  Chloride 98 - 111 mmol/L 98 95(L) 103  CO2 22 - 32 mmol/L 25 22 25   Calcium 8.9 - 10.3 mg/dL 9.9 10.2 8.9  Total Protein 6.5 - 8.1 g/dL 7.2 7.6 -  Total Bilirubin 0.3 - 1.2 mg/dL 1.4(H) 1.6(H) -  Alkaline Phos 38 - 126 U/L 95 93 -  AST 15 - 41 U/L 26 47(H) -  ALT 0 - 44 U/L 16 27 -    Lipid Panel     Component Value Date/Time   CHOL 240 (H) 03/22/2019 1428   TRIG 90 03/22/2019 1428   HDL 65 03/22/2019 1428   CHOLHDL 3.7 03/22/2019 1428   VLDL 18 03/22/2019 1428   LDLCALC 157 (H) 03/22/2019 1428    CBC    Component Value Date/Time   WBC 7.6 07/20/2019 2013   RBC 4.77 07/20/2019 2013   HGB 15.9 (H) 07/20/2019 2013   HGB 15.2 10/09/2018 1012   HCT 46.5 (H) 07/20/2019 2013   HCT 44.1 10/09/2018 1012   PLT 197 07/20/2019 2013   PLT 185 10/09/2018 1012   MCV 97.5 07/20/2019 2013   MCV 100 (H) 10/09/2018 1012   MCH 33.3 07/20/2019 2013   MCHC 34.2 07/20/2019 2013   RDW 12.3 07/20/2019 2013   RDW 12.4 10/09/2018 1012   LYMPHSABS 3.8 07/20/2019 2013   LYMPHSABS 2.7  10/09/2018 1012   MONOABS 1.0 07/20/2019 2013   EOSABS 0.3 07/20/2019 2013   EOSABS 0.1 10/09/2018 1012   BASOSABS 0.1 07/20/2019 2013   BASOSABS 0.1 10/09/2018 1012    Lab Results  Component Value Date   HGBA1C 4.6 08/25/2019     Assessment & Plan:   1. Neuropathy, alcoholic ([place]) Screen for diabetes is negative We will check for B12 deficiency Commence gabapentin-discussed sedating side effects - Vitamin B12 - Complete Metabolic Panel with GFR  2. Other eczema Uncontrolled on triamcinolone Short course of prednisone Counseled on skin care  triamcinolone cream (KENALOG) 0.1 %; Apply 1 application topically 2 (two) times daily.  Dispense: 30 g; Refill: 1 - predniSONE (DELTASONE) 20 MG tablet; Take 1 tablet (20 mg total) by mouth daily with breakfast.  Dispense: 5 tablet; Refill: 0  3. Screening for diabetes mellitus A1c is 4.6 which is negative for diabetes - HgB A1c  4. Alcoholic cirrhosis of liver without ascites (HCC) Ongoing alcohol abuse but she has cut back Counseled on cessation and detrimental effects of ongoing alcohol use  5. Healthcare maintenance - flu shot      Charlott Rakes, MD, FAAFP. [facility], [place]   08/25/2019, 11:09 AM

## 2019-08-25 NOTE — Patient Instructions (Signed)
Once I review your test results, I will be in touch with you via 'my chart' or a phone call from my office (if you are not signed up for my chart).  

## 2019-08-26 ENCOUNTER — Encounter: Payer: Self-pay | Admitting: Family Medicine

## 2019-08-26 LAB — VITAMIN B12: Vitamin B-12: 732 pg/mL (ref 232–1245)

## 2019-08-26 LAB — CMP14+EGFR
ALT: 30 IU/L (ref 0–32)
AST: 67 IU/L — ABNORMAL HIGH (ref 0–40)
Albumin/Globulin Ratio: 1.6 (ref 1.2–2.2)
Albumin: 4.5 g/dL (ref 3.8–4.8)
Alkaline Phosphatase: 174 IU/L — ABNORMAL HIGH (ref 39–117)
BUN/Creatinine Ratio: 6 — ABNORMAL LOW (ref 9–23)
BUN: 3 mg/dL — ABNORMAL LOW (ref 6–24)
Bilirubin Total: 0.2 mg/dL (ref 0.0–1.2)
CO2: 22 mmol/L (ref 20–29)
Calcium: 9.6 mg/dL (ref 8.7–10.2)
Chloride: 99 mmol/L (ref 96–106)
Creatinine, Ser: 0.51 mg/dL — ABNORMAL LOW (ref 0.57–1.00)
GFR calc Af Amer: 135 mL/min/{1.73_m2} (ref >59)
GFR calc non Af Amer: 117 mL/min/{1.73_m2} (ref >59)
Globulin, Total: 2.9 g/dL (ref 1.5–4.5)
Glucose: 81 mg/dL (ref 65–99)
Potassium: 4.4 mmol/L (ref 3.5–5.2)
Sodium: 138 mmol/L (ref 134–144)
Total Protein: 7.4 g/dL (ref 6.0–8.5)

## 2019-09-05 ENCOUNTER — Encounter (HOSPITAL_COMMUNITY): Payer: Self-pay | Admitting: *Deleted

## 2019-09-05 ENCOUNTER — Encounter: Payer: Self-pay | Admitting: Family Medicine

## 2019-09-05 ENCOUNTER — Emergency Department (HOSPITAL_COMMUNITY)
Admission: EM | Admit: 2019-09-05 | Discharge: 2019-09-05 | Disposition: A | Discharge status: Home / Self Care | Payer: Medicaid Other | Attending: Emergency Medicine | Admitting: Emergency Medicine

## 2019-09-05 ENCOUNTER — Other Ambulatory Visit: Payer: Self-pay

## 2019-09-05 DIAGNOSIS — J449 Chronic obstructive pulmonary disease, unspecified: Secondary | ICD-10-CM | POA: Diagnosis not present

## 2019-09-05 DIAGNOSIS — Z79899 Other long term (current) drug therapy: Secondary | ICD-10-CM | POA: Diagnosis not present

## 2019-09-05 DIAGNOSIS — Z8541 Personal history of malignant neoplasm of cervix uteri: Secondary | ICD-10-CM | POA: Insufficient documentation

## 2019-09-05 DIAGNOSIS — R109 Unspecified abdominal pain: Secondary | ICD-10-CM

## 2019-09-05 DIAGNOSIS — F17210 Nicotine dependence, cigarettes, uncomplicated: Secondary | ICD-10-CM | POA: Insufficient documentation

## 2019-09-05 LAB — LIPASE, BLOOD: Lipase: 35 U/L (ref 11–51)

## 2019-09-05 LAB — RAPID URINE DRUG SCREEN, HOSP PERFORMED
Amphetamines: NOT DETECTED
Barbiturates: NOT DETECTED
Benzodiazepines: NOT DETECTED
Cocaine: NOT DETECTED
Opiates: NOT DETECTED
Tetrahydrocannabinol: POSITIVE — AB

## 2019-09-05 LAB — COMPREHENSIVE METABOLIC PANEL
ALT: 23 U/L (ref 0–44)
AST: 44 U/L — ABNORMAL HIGH (ref 15–41)
Albumin: 3.8 g/dL (ref 3.5–5.0)
Alkaline Phosphatase: 113 U/L (ref 38–126)
Anion gap: 13 (ref 5–15)
BUN: <5 mg/dL — ABNORMAL LOW (ref 6–20)
CO2: 22 mmol/L (ref 22–32)
Calcium: 8.9 mg/dL (ref 8.9–10.3)
Chloride: 98 mmol/L (ref 98–111)
Creatinine, Ser: 0.54 mg/dL (ref 0.44–1.00)
GFR calc Af Amer: >60 mL/min (ref >60)
GFR calc non Af Amer: >60 mL/min (ref >60)
Glucose, Bld: 128 mg/dL — ABNORMAL HIGH (ref 70–99)
Potassium: 3.4 mmol/L — ABNORMAL LOW (ref 3.5–5.1)
Sodium: 133 mmol/L — ABNORMAL LOW (ref 135–145)
Total Bilirubin: 0.5 mg/dL (ref 0.3–1.2)
Total Protein: 6.9 g/dL (ref 6.5–8.1)

## 2019-09-05 LAB — URINALYSIS, ROUTINE W REFLEX MICROSCOPIC
Bilirubin Urine: NEGATIVE
Glucose, UA: NEGATIVE mg/dL
Hgb urine dipstick: NEGATIVE
Ketones, ur: NEGATIVE mg/dL
Leukocytes,Ua: NEGATIVE
Nitrite: NEGATIVE
Protein, ur: NEGATIVE mg/dL
Specific Gravity, Urine: 1.008 (ref 1.005–1.030)
pH: 5 (ref 5.0–8.0)

## 2019-09-05 LAB — CBC
HCT: 42.2 % (ref 36.0–46.0)
Hemoglobin: 14.3 g/dL (ref 12.0–15.0)
MCH: 33.6 pg (ref 26.0–34.0)
MCHC: 33.9 g/dL (ref 30.0–36.0)
MCV: 99.3 fL (ref 80.0–100.0)
Platelets: 266 10*3/uL (ref 150–400)
RBC: 4.25 MIL/uL (ref 3.87–5.11)
RDW: 12.6 % (ref 11.5–15.5)
WBC: 9.2 10*3/uL (ref 4.0–10.5)
nRBC: 0 % (ref 0.0–0.2)

## 2019-09-05 LAB — ETHANOL: Alcohol, Ethyl (B): 255 mg/dL — ABNORMAL HIGH (ref <10)

## 2019-09-05 LAB — AMMONIA: Ammonia: 40 umol/L — ABNORMAL HIGH (ref 9–35)

## 2019-09-05 MED ORDER — SODIUM CHLORIDE 0.9% FLUSH: 3.0000 mL | Freq: Once | INTRAVENOUS | Status: DC

## 2019-09-05 MED ORDER — IBUPROFEN 400 MG PO TABS: 200.0000 mg | ORAL_TABLET | Freq: Four times a day (QID) | ORAL | 1 refills | Status: AC | PRN

## 2019-09-05 MED ORDER — IBUPROFEN 400 MG PO TABS
400.0000 mg | ORAL_TABLET | Freq: Once | ORAL | Status: AC
  Administered 2019-09-05: 22:00:00 400 mg via ORAL
  Filled 2019-09-05: qty 1

## 2019-09-05 MED ORDER — CYCLOBENZAPRINE HCL 5 MG PO TABS: 5.0000 mg | ORAL_TABLET | Freq: Three times a day (TID) | ORAL | 0 refills | Status: DC | PRN

## 2019-09-05 MED ORDER — CYCLOBENZAPRINE HCL 10 MG PO TABS
5.0000 mg | ORAL_TABLET | Freq: Once | ORAL | Status: AC
  Administered 2019-09-05: 22:00:00 5 mg via ORAL
  Filled 2019-09-05: qty 1

## 2019-09-05 NOTE — Discharge Instructions (Addendum)
Please be sure to follow up with your PCP in 3-5 days for your right sided flank pain. It appears your pain in musculoskeletal in nature however if you pain does not get better, or becomes severe or you experience uncontrolled nausea, vomiting, and/or inability to drink fluids please seek care immediately.

## 2019-09-05 NOTE — ED Triage Notes (Signed)
The pt is c/o rt flank pain for the past hour  lmp none   The pt is talking thicked tongue  ?? alcohol

## 2019-09-05 NOTE — ED Notes (Signed)
Pt confirmed that she had a ride home with this RN and MD. Discharged to lobby and agreeable that she would wait on them to come transport her home

## 2019-09-05 NOTE — ED Notes (Signed)
Sent  urine culture with the urine specimen 

## 2019-09-05 NOTE — ED Notes (Signed)
Patient stood and walked in room. Pt states aside from back pain she feels fine.

## 2019-09-05 NOTE — ED Provider Notes (Signed)
[facility] EMERGENCY DEPARTMENT Provider Note   CSN: [id number] Arrival date & time: 09/05/19  1937     History Chief Complaint  Patient presents with  . Flank Pain    Felicia Perkins is a 44 y.o. female.  HPI  Felicia Perkins is a 44y/o female with PMH of alcohol dependence, GERD, Pancreatitis, Bipolar disorder, cirrohsis, COPD, tobacco dependence, and gastritis. She presents with onset of right sided flank pain that just began today. She works at [employer] and got up at [employer] and "felt fine" went to work and came home and her right sided pain started and has not gotten better. It is described as a sharp pain that is non-radiating and constant. She has some mild nausea but no vomiting. She has some constipation but no urinary symptoms. She denies chest pain, SOB, difficulty breathing, fever, chills, muscle aches, or joint pain. She denies any trauma or recent injury. Pain is not associated with eating or with after meals.    Past Medical History:  Diagnosis Date  . Alcohol abuse   . Anxiety   . Bipolar 1 disorder ([place])   . Cancer (HCC)    cervical cancer  . GERD (gastroesophageal reflux disease)   . Pancreatitis   . PTSD (post-traumatic stress disorder)     Patient Active Problem List   Diagnosis Date Noted  . Acute on chronic pancreatitis ([place]) 03/22/2019  . Bipolar 1 disorder ([place]) 01/13/2019  . Eczema 10/09/2018  . Acute pancreatitis   . Alcohol induced acute pancreatitis 11/22/2017  . Duodenitis 11/22/2017  . Tobacco abuse counseling   . 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 Nondisplaced fracture of neck of fifth metacarpal bone, right hand, initial encounter for closed fracture 08/26/2017  . Alcoholic cirrhosis of liver without ascites ([place]) 10/15/2016  . Alcohol abuse 09/04/2016  . Gastritis and gastroduodenitis 09/04/2016  . Tobacco abuse 09/04/2016  . COPD (chronic obstructive pulmonary disease) ([place]) 09/04/2016  . Elevated liver enzymes  12/22/2014  . Abdominal pain, epigastric 12/16/2014  . Chest pain with moderate risk for cardiac etiology 12/15/2014  . Alcohol dependence ([place]) 12/16/2012    Class: Acute    Past Surgical History:  Procedure Laterality Date  . ABDOMINAL HYSTERECTOMY  2004  . BIOPSY  01/02/2018   Procedure: BIOPSY;  Surgeon: Milus Banister, MD;  Location: [facility];  Service: Endoscopy;;  . ESOPHAGOGASTRODUODENOSCOPY (EGD) WITH PROPOFOL N/A 01/02/2018   Procedure: ESOPHAGOGASTRODUODENOSCOPY (EGD) WITH PROPOFOL;  Surgeon: Milus Banister, MD;  Location: [facility];  Service: Endoscopy;  Laterality: N/A;  . EUS N/A 01/02/2018   Procedure: UPPER ENDOSCOPIC ULTRASOUND (EUS) RADIAL;  Surgeon: Milus Banister, MD;  Location: [facility];  Service: Endoscopy;  Laterality: N/A;  . FOOT SURGERY    . OTHER SURGICAL HISTORY     ovarian surgery  . TUBAL LIGATION       OB History   No obstetric history on file.     Family History  Problem Relation Age of Onset  . Hypertension Mother   . Breast cancer Mother 38  . Pancreatitis Mother   . Hypertension Father   . Heart attack Cousin 30       died in 94s of MI  . Breast cancer Maternal Aunt   . Breast cancer Maternal Aunt   . Colon cancer Neg Hx     Social History   Tobacco Use  . Smoking status: Current Every Day Smoker    Packs/day: 0.50    Types:  Cigarettes  . Smokeless tobacco: Never Used  . Tobacco comment: 2-3 cigarettes daily  Substance Use Topics  . Alcohol use: Not Currently    Comment: no alchol for the past 3 weeks  . Drug use: No    Home Medications Prior to Admission medications   Medication Sig Start Date End Date Taking? Authorizing Provider  albuterol (VENTOLIN HFA) 108 (90 Base) MCG/ACT inhaler Inhale 1-2 puffs into the lungs every 6 (six) hours as needed for wheezing or shortness of breath. 01/13/19   Charlott Rakes, MD  buPROPion (WELLBUTRIN SR) 150 MG 12 hr tablet Take 1 tablet (150 mg total) by mouth 2 (two) times  daily. For smoking cessation Patient not taking: Reported on 03/22/2019 01/13/19   Charlott Rakes, MD  cloNIDine (CATAPRES) 0.1 MG tablet Take 1 tablet (0.1 mg total) by mouth at bedtime. For hot flashes 01/13/19   Charlott Rakes, MD  docusate sodium (COLACE) 100 MG capsule Take 1 capsule (100 mg total) by mouth every 12 (twelve) hours. 07/20/19   Zigmund Gottron, NP  famotidine (PEPCID) 20 MG tablet Take 1 tablet (20 mg total) by mouth 2 (two) times daily. 07/31/19   Charlott Rakes, MD  gabapentin (NEURONTIN) 300 MG capsule Take 1 capsule (300 mg total) by mouth at bedtime. 08/25/19   Charlott Rakes, MD  hydrocortisone (ANUSOL-HC) 2.5 % rectal cream Place 1 application rectally at bedtime. Use in rectum for 7 days. Patient not taking: Reported on 03/22/2019 12/11/17   Willia Craze, NP  mometasone-formoterol [facility]) 200-5 MCG/ACT AERO Inhale 2 puffs into the lungs 2 (two) times daily. 01/13/19   Charlott Rakes, MD  Multiple Vitamins-Calcium (ONE-A-DAY WOMENS PO) Take 1 tablet by mouth daily.    [provider]  naltrexone (DEPADE) 50 MG tablet Take 1 tablet (50 mg total) by mouth daily. Patient not taking: Reported on 03/22/2019 01/13/19   Charlott Rakes, MD  nicotine (NICODERM CQ - DOSED IN MG/24 HOURS) 21 mg/24hr patch Place 1 patch (21 mg total) onto the skin daily. Patient not taking: Reported on 08/25/2019 06/04/18   Charlott Rakes, MD  predniSONE (DELTASONE) 20 MG tablet Take 1 tablet (20 mg total) by mouth daily with breakfast. 08/25/19   Charlott Rakes, MD  triamcinolone cream (KENALOG) 0.1 % Apply 1 application topically 2 (two) times daily. 08/25/19   Charlott Rakes, MD    Allergies    Patient has no known allergies.  Review of Systems   Review of Systems  Constitutional: Negative for activity change, diaphoresis, fatigue and fever.  HENT: Negative for rhinorrhea, sinus pressure, sinus pain, sneezing and sore throat.   Respiratory: Negative for cough, chest tightness, shortness  of breath and wheezing.   Cardiovascular: Negative for chest pain, palpitations and leg swelling.  Gastrointestinal: Positive for constipation and nausea. Negative for abdominal pain, diarrhea and vomiting.  Genitourinary: Positive for flank pain. Negative for difficulty urinating, dysuria, frequency and urgency.  Skin: Negative for rash.  Neurological: Negative for weakness, light-headedness and headaches.    Physical Exam Updated Vital Signs BP (!) 128/94 (BP Location: Left Arm)   Pulse 79   Temp 97.9 F (36.6 C) (Oral)   Resp 18   Ht 5' 4.5" (1.638 m)   Wt 45.8 kg   SpO2 100%   BMI 17.06 kg/m   Physical Exam Vitals reviewed.  Constitutional:      General: She is not in acute distress.    Appearance: She is not ill-appearing or toxic-appearing.  HENT:  Head: Normocephalic and atraumatic.  Cardiovascular:     Rate and Rhythm: Normal rate and regular rhythm.     Pulses: Normal pulses.     Heart sounds: Normal heart sounds. No murmur.  Pulmonary:     Effort: Pulmonary effort is normal.     Breath sounds: No wheezing, rhonchi or rales.  Abdominal:     General: Abdomen is flat. Bowel sounds are normal.     Palpations: Abdomen is soft.     Tenderness: There is no abdominal tenderness. There is right CVA tenderness. There is no guarding or rebound.  Musculoskeletal:     Right lower leg: No edema.     Left lower leg: No edema.  Skin:    General: Skin is warm.     Findings: No erythema or rash.  Neurological:     General: No focal deficit present.     Mental Status: She is alert and oriented to person, place, and time.    ED Results / Procedures / Treatments   Labs (all labs ordered are listed, but only abnormal results are displayed) Labs Reviewed  COMPREHENSIVE METABOLIC PANEL - Abnormal; Notable for the following components:      Result Value   Sodium 133 (*)    Potassium 3.4 (*)    Glucose, Bld 128 (*)    BUN <5 (*)    AST 44 (*)    All other components  within normal limits  LIPASE, BLOOD  CBC  URINALYSIS, ROUTINE W REFLEX MICROSCOPIC  RAPID URINE DRUG SCREEN, HOSP PERFORMED  ETHANOL   EKG None  Radiology No results found.  Procedures Procedures (including critical care time)  Medications Ordered in ED Medications  sodium chloride flush (NS) 0.9 % injection 3 mL (has no administration in time range)    ED Course  I have reviewed the triage vital signs and the nursing notes.  Pertinent labs & imaging results that were available during my care of the patient were reviewed by me and considered in my medical decision making (see chart for details).  Felicia Selita Staiger is a 44y/o female with PMH of cirrhosis, pancreatitis, COPD, alcohol dependence, tobacco dependence, gastritis, and bipolar type 1 disorder who presented to the ED with acute onset right sided flank pain. Differential considered includes nephrolithasis, cholethiasis, referred pain from cirrhosis, recurrent pancreatitis, and MSK pain. Her work up included CBC, CMP, Lipase, U/A. CBC was unremarkable. CMP significant for mild hyponatremia of 133 and mild hypokalemia at 3.4. AST also showed very mild elevation at 44 but ALT and Alk Phos were normal. Given her U/A had no blood this makes nephrolithiasis unlikely. Patient remained afebrile with no leukocytosis and normal CMP making infectious process as the cause unlikely. Her lipase was normal making pancreatitis unlikely as well. Given her history and exam were not consistent with cholelithiasis and her pain did not radiated or come from her abdomen cholelithiasis is unlikely. Due to her history of alcohol dependence and report by nursing on her coming into the ED with slight slurred speech a UDS was ordered and was positive for cannabis at time of discharge. Her ethanol level was elevated at 255 but patient was able to ambulate safely in the hospital and had someone come and pick her up and did not drive herself home. Given patient  had a largely negative work up with very specific and localized pain that was reproducible on exam her pain is most likely MSK in etiology. She was given Motrin and flexeril  and deemed safe to be discharged home and to have close follow up with her PCP. Patient was able to ambulate safely and was discharged home.    MDM Rules/Calculators/A&P                     Zilpha BRITTON PERKINSON was evaluated in Emergency Department on 09/05/2019 for the symptoms described in the history of present illness. She was evaluated in the context of the global COVID-19 pandemic, which necessitated consideration that the patient might be at risk for infection with the SARS-CoV-2 virus that causes COVID-19. Institutional protocols and algorithms that pertain to the evaluation of patients at risk for COVID-19 are in a state of rapid change based on information released by regulatory bodies including the CDC and federal and state organizations. These policies and algorithms were followed during the patient's care in the ED.  Final Clinical Impression(s) / ED Diagnoses Final diagnoses:  Right flank pain    Rx / DC Orders ED Discharge Orders         Ordered    ibuprofen (ADVIL) 400 MG tablet  Every 6 hours PRN     09/05/19 2205    cyclobenzaprine (FLEXERIL) 5 MG tablet  3 times daily PRN     09/05/19 2205           Nuala Alpha, DO 09/05/19 2212    Elnora Morrison, MD 09/05/19 [phone]

## 2019-09-17 ENCOUNTER — Encounter: Payer: Self-pay | Admitting: Family Medicine

## 2019-09-18 ENCOUNTER — Other Ambulatory Visit: Payer: Self-pay | Admitting: Family Medicine

## 2019-09-18 DIAGNOSIS — K7030 Alcoholic cirrhosis of liver without ascites: Secondary | ICD-10-CM

## 2019-09-27 ENCOUNTER — Encounter: Payer: Self-pay | Admitting: Family Medicine

## 2019-10-06 ENCOUNTER — Ambulatory Visit: Payer: Medicaid Other | Attending: Internal Medicine

## 2019-10-06 DIAGNOSIS — Z20822 Contact with and (suspected) exposure to covid-19: Secondary | ICD-10-CM

## 2019-10-07 LAB — NOVEL CORONAVIRUS, NAA: SARS-CoV-2, NAA: NOT DETECTED

## 2019-10-14 ENCOUNTER — Other Ambulatory Visit: Payer: Self-pay

## 2019-10-14 ENCOUNTER — Encounter: Payer: Self-pay | Admitting: Nurse Practitioner

## 2019-10-14 ENCOUNTER — Ambulatory Visit: Payer: Medicaid Other | Admitting: Nurse Practitioner

## 2019-10-14 VITALS — BP 140/90 | HR 80 | Temp 97.0°F | Ht 64.0 in | Wt 100.0 lb

## 2019-10-14 DIAGNOSIS — R110 Nausea: Secondary | ICD-10-CM | POA: Diagnosis not present

## 2019-10-14 DIAGNOSIS — R1013 Epigastric pain: Secondary | ICD-10-CM

## 2019-10-14 DIAGNOSIS — K5909 Other constipation: Secondary | ICD-10-CM

## 2019-10-14 DIAGNOSIS — F1010 Alcohol abuse, uncomplicated: Secondary | ICD-10-CM

## 2019-10-14 DIAGNOSIS — Z01818 Encounter for other preprocedural examination: Secondary | ICD-10-CM | POA: Diagnosis not present

## 2019-10-14 NOTE — Progress Notes (Signed)
IMPRESSION and PLAN:    Felicia Perkins is a 44 y.o. female with a pmh significant for, not necessarily limited to pancreatitis, alcohol abuse    # Upper abdominal pain  / nausea x 2 months. -Hx of etoh pancreatitis and last CT scan in Aug remarkable for acute on chronic pancreatitis.   Pain may be secondary to chronic pancreatitis however patient says it feels different and nausea worse.  -There is possible history of NSAID induced duodenal ulcer in May 2019 ( CT scan, MRI, EUS). She recently took NSAIDS again so pain could be from PUD.  -Will arrange for EGD to rule out other etiologies of pain / nausea. If negative then pain probably secondary to chronic pancreatitis and I explained to her that avoidance of Etoh is imperative.  -Avoid NSAIDS -Continue PPI in am, Pepcid at QHS -consider pancreatic enzyme replacement   # Chronic alcoholic pancreatitis  # Constipation, mild.  -Metamucil 1-2 x daily -continue good hydration  HPI:    Primary GI: Dr. Hilarie Fredrickson  Chief complaint : Abdominal pain and nausea  **History comes from the chart and patient  Felicia Perkins is a 44 year old female with history of alcoholic pancreatitis in April 2019.  CT scan showed peripancreatic around the head and tail of pancreas but also a small enhancing rim structure medial to D2 raising concern for penetrating duodenal ulcer.  Follow up  MRI  12/17/17 showed a hypervascular lesion in the head of the pancreas contiguous with adjacent duodenum.  She underwent an endoscopic ultrasound by Dr. Ardis Hughs 01/02/18.  Poor imaging of the ampulla/proximal pancreatic head due to inability to maneuver the echoendoscope through the duodenal bulb but no clear masses seen.  Suspicion was for an NSAID related ulcer of the duodenum which had since healed.  Follow-up CT scan recommended for 3 months which patient had done 03/21/2018 with findings of interval resolution of the enhancing lesion in the head of pancreas.  Of  note, patient rehospitalized mid August 2020 with abdominal pain.  CT scan w/  03/22/2019 with findings of inflammatory changes surrounding the pancreatic head associated with underlying parenchymal calcifications consistent with acute on chronic pancreatitis.  She has since come back to the ED in December for abdominal pain  Felicia Perkins is here today with complaints of abdominal pain and nausea over the last 2 months. Says pain feels different than pancreatitis. Episodes of pain occur several times a day, generally last about 30 minutes.  Symptoms are worse in the a.m., food really does not help or exacerbate the pain. The pan is worse when lying down.   She is drinking about 28 ounces of beer every day compared to 80 ounces daily last year. She took Advil for few days last month for back pain.   She is taking both Protonix 40 mg and famotidine 20 mg every morning.   She tries to stay busy to keep her mind off drinking.  Her weight is down from 112 pounds in August to 100 pounds today.  Felicia Perkins complains of constipation occurring about twice a month.  She took MiraLAX twice daily for about a week without improvement.  Now she takes milk of magnesia as needed.  Data Reviewed:  09/03/19 Lipase 35 CBC normal AST 44 / ALT 23 liver tests o/w normal.   Review of systems:     No chest pain, no SOB, no fevers, no urinary sx   Past Medical History:  Diagnosis  Date  . Alcohol abuse   . Anxiety   . Bipolar 1 disorder ([place])   . Cancer (HCC)    cervical cancer      . GERD (gastroesophageal reflux disease)   . Pancreatitis   . PTSD (post-traumatic stress disorder)     Patient's surgical history, family medical history, social history, medications and allergies were all reviewed in Epic   Creatinine clearance cannot be calculated (Patient's most recent lab result is older than the maximum 21 days allowed.)  Current Outpatient Medications  Medication Sig Dispense Refill  . albuterol (VENTOLIN HFA) 108  (90 Base) MCG/ACT inhaler Inhale 1-2 puffs into the lungs every 6 (six) hours as needed for wheezing or shortness of breath. 1 Inhaler 3  . cloNIDine (CATAPRES) 0.1 MG tablet Take 1 tablet (0.1 mg total) by mouth at bedtime. For hot flashes 30 tablet 3  . cyclobenzaprine (FLEXERIL) 5 MG tablet Take 1 tablet (5 mg total) by mouth 3 (three) times daily as needed for muscle spasms. 10 tablet 0  . docusate sodium (COLACE) 100 MG capsule Take 1 capsule (100 mg total) by mouth every 12 (twelve) hours. 60 capsule 0  . famotidine (PEPCID) 20 MG tablet Take 1 tablet (20 mg total) by mouth 2 (two) times daily. 60 tablet 2  . gabapentin (NEURONTIN) 300 MG capsule Take 1 capsule (300 mg total) by mouth at bedtime. 30 capsule 6  . ibuprofen (ADVIL) 400 MG tablet Take 0.5 tablets (200 mg total) by mouth every 6 (six) hours as needed. 10 tablet 1  . mometasone-formoterol (DULERA) 200-5 MCG/ACT AERO Inhale 2 puffs into the lungs 2 (two) times daily. 1 Inhaler 3  . Multiple Vitamin (MULTIVITAMIN WITH MINERALS) TABS tablet Take 1 tablet by mouth daily.    . pantoprazole (PROTONIX) 40 MG tablet Take 40 mg by mouth 2 (two) times daily.    . predniSONE (DELTASONE) 20 MG tablet Take 1 tablet (20 mg total) by mouth daily with breakfast. 5 tablet 0  . triamcinolone cream (KENALOG) 0.1 % Apply 1 application topically 2 (two) times daily. 30 g 1  . nicotine (NICODERM CQ - DOSED IN MG/24 HOURS) 21 mg/24hr patch Place 1 patch (21 mg total) onto the skin daily. (Patient not taking: Reported on 08/25/2019) 28 patch 3   No current facility-administered medications for this visit.    Physical Exam:     BP 140/90   Pulse 80   Temp (!) 97 F (36.1 C)   Ht 5\' 4"  (1.626 m)   Wt 100 lb (45.4 kg)   BMI 17.16 kg/m   GENERAL:  Pleasant female in NAD PSYCH: : Cooperative, normal affect CARDIAC:  RRR, no murmur heard, no peripheral edema PULM: Normal respiratory effort, lungs CTA bilaterally, no wheezing ABDOMEN:   Nondistended, soft, nontender. No obvious masses, no hepatomegaly,  normal bowel sounds SKIN:  turgor, no lesions seen Musculoskeletal:  Normal muscle tone, normal strength NEURO: Alert and oriented x 3, no focal neurologic deficits   Tye Savoy , NP 10/14/2019, 3:23 PM

## 2019-10-14 NOTE — Patient Instructions (Signed)
If you are age 45 or older, your body mass index should be between 23-30. Your Body mass index is 17.16 kg/m. If this is out of the aforementioned range listed, please consider follow up with your Primary Care Provider.  If you are age 70 or younger, your body mass index should be between 19-25. Your Body mass index is 17.16 kg/m. If this is out of the aformentioned range listed, please consider follow up with your Primary Care Provider.   You have been scheduled for an endoscopy. Please follow written instructions given to you at your visit today. If you use inhalers (even only as needed), please bring them with you on the day of your procedure. Your physician has requested that you go to www.startemmi.com and enter the access code given to you at your visit today. This web site gives a general overview about your procedure. However, you should still follow specific instructions given to you by our office regarding your preparation for the procedure.  Start Metamucil 1-2 times daily.  Drink 64 ounces of water daily.  NO Advil, or other anti-inflammatories.  STOP drinking.  Thank you for choosing me and [facility].   Tye Savoy, NP

## 2019-10-16 ENCOUNTER — Ambulatory Visit (INDEPENDENT_AMBULATORY_CARE_PROVIDER_SITE_OTHER): Payer: Medicaid Other

## 2019-10-16 ENCOUNTER — Other Ambulatory Visit: Payer: Self-pay

## 2019-10-16 DIAGNOSIS — Z1159 Encounter for screening for other viral diseases: Secondary | ICD-10-CM

## 2019-10-17 ENCOUNTER — Emergency Department (HOSPITAL_COMMUNITY): Payer: Medicaid Other

## 2019-10-17 ENCOUNTER — Encounter (HOSPITAL_COMMUNITY): Payer: Self-pay | Admitting: *Deleted

## 2019-10-17 ENCOUNTER — Emergency Department (HOSPITAL_COMMUNITY)
Admission: EM | Admit: 2019-10-17 | Discharge: 2019-10-17 | Disposition: A | Discharge status: Home / Self Care | Payer: Medicaid Other | Attending: Emergency Medicine | Admitting: Emergency Medicine

## 2019-10-17 ENCOUNTER — Other Ambulatory Visit: Payer: Self-pay

## 2019-10-17 DIAGNOSIS — F10129 Alcohol abuse with intoxication, unspecified: Secondary | ICD-10-CM | POA: Diagnosis not present

## 2019-10-17 DIAGNOSIS — Z87898 Personal history of other specified conditions: Secondary | ICD-10-CM

## 2019-10-17 DIAGNOSIS — Z79899 Other long term (current) drug therapy: Secondary | ICD-10-CM | POA: Diagnosis not present

## 2019-10-17 DIAGNOSIS — R1012 Left upper quadrant pain: Secondary | ICD-10-CM | POA: Insufficient documentation

## 2019-10-17 DIAGNOSIS — Y908 Blood alcohol level of 240 mg/100 ml or more: Secondary | ICD-10-CM | POA: Diagnosis not present

## 2019-10-17 DIAGNOSIS — J449 Chronic obstructive pulmonary disease, unspecified: Secondary | ICD-10-CM | POA: Diagnosis not present

## 2019-10-17 DIAGNOSIS — F17210 Nicotine dependence, cigarettes, uncomplicated: Secondary | ICD-10-CM | POA: Diagnosis not present

## 2019-10-17 DIAGNOSIS — R1010 Upper abdominal pain, unspecified: Secondary | ICD-10-CM

## 2019-10-17 LAB — CBC
HCT: 42.4 % (ref 36.0–46.0)
Hemoglobin: 14.7 g/dL (ref 12.0–15.0)
MCH: 34.5 pg — ABNORMAL HIGH (ref 26.0–34.0)
MCHC: 34.7 g/dL (ref 30.0–36.0)
MCV: 99.5 fL (ref 80.0–100.0)
Platelets: 273 10*3/uL (ref 150–400)
RBC: 4.26 MIL/uL (ref 3.87–5.11)
RDW: 12.4 % (ref 11.5–15.5)
WBC: 9 10*3/uL (ref 4.0–10.5)
nRBC: 0 % (ref 0.0–0.2)

## 2019-10-17 LAB — BASIC METABOLIC PANEL
Anion gap: 15 (ref 5–15)
BUN: <5 mg/dL — ABNORMAL LOW (ref 6–20)
CO2: 20 mmol/L — ABNORMAL LOW (ref 22–32)
Calcium: 9.1 mg/dL (ref 8.9–10.3)
Chloride: 98 mmol/L (ref 98–111)
Creatinine, Ser: 0.45 mg/dL (ref 0.44–1.00)
GFR calc Af Amer: >60 mL/min (ref >60)
GFR calc non Af Amer: >60 mL/min (ref >60)
Glucose, Bld: 93 mg/dL (ref 70–99)
Potassium: 3.7 mmol/L (ref 3.5–5.1)
Sodium: 133 mmol/L — ABNORMAL LOW (ref 135–145)

## 2019-10-17 LAB — HEPATIC FUNCTION PANEL
ALT: 27 U/L (ref 0–44)
AST: 46 U/L — ABNORMAL HIGH (ref 15–41)
Albumin: 4 g/dL (ref 3.5–5.0)
Alkaline Phosphatase: 111 U/L (ref 38–126)
Bilirubin, Direct: 0.1 mg/dL (ref 0.0–0.2)
Indirect Bilirubin: 0.7 mg/dL (ref 0.3–0.9)
Total Bilirubin: 0.8 mg/dL (ref 0.3–1.2)
Total Protein: 7.5 g/dL (ref 6.5–8.1)

## 2019-10-17 LAB — I-STAT BETA HCG BLOOD, ED (MC, WL, AP ONLY): I-stat hCG, quantitative: <5 m[IU]/mL (ref <5)

## 2019-10-17 LAB — SARS CORONAVIRUS 2 (TAT 6-24 HRS): SARS Coronavirus 2: NEGATIVE

## 2019-10-17 LAB — TROPONIN I (HIGH SENSITIVITY): Troponin I (High Sensitivity): 5 ng/L (ref <18)

## 2019-10-17 LAB — ETHANOL: Alcohol, Ethyl (B): 252 mg/dL — ABNORMAL HIGH (ref <10)

## 2019-10-17 LAB — LIPASE, BLOOD: Lipase: 57 U/L — ABNORMAL HIGH (ref 11–51)

## 2019-10-17 MED ORDER — LIDOCAINE VISCOUS HCL 2 % MT SOLN
15.0000 mL | Freq: Once | OROMUCOSAL | Status: AC
  Administered 2019-10-17: 15 mL via ORAL
  Filled 2019-10-17: qty 15

## 2019-10-17 MED ORDER — ALUM & MAG HYDROXIDE-SIMETH 200-200-20 MG/5ML PO SUSP
30.0000 mL | Freq: Once | ORAL | Status: AC
  Administered 2019-10-17: 30 mL via ORAL
  Filled 2019-10-17: qty 30

## 2019-10-17 MED ORDER — SODIUM CHLORIDE 0.9% FLUSH: 3.0000 mL | Freq: Once | INTRAVENOUS | Status: DC

## 2019-10-17 MED ORDER — METOCLOPRAMIDE HCL 5 MG/ML IJ SOLN
10.0000 mg | Freq: Once | INTRAMUSCULAR | Status: AC
  Administered 2019-10-17: 10 mg via INTRAVENOUS
  Filled 2019-10-17: qty 2

## 2019-10-17 NOTE — ED Provider Notes (Signed)
[facility] EMERGENCY DEPARTMENT Provider Note  CSN: [id number] Arrival date & time: 10/17/19 0302  Chief Complaint(s) Chest Pain and Abdominal Pain  HPI Felicia Perkins is a 44 y.o. female with a past medical history listed below who presents to the emergency department with 1 week of left upper quadrant and epigastric abdominal discomfort similar to prior episodes.  Patient endorses continued alcohol use.  Pain radiates up to the chest.  Denies any shortness of breath.  Pain is nonexertional.  Worse with palpation.  No alleviating factors.  Patient denies nausea without emesis.  No diarrhea.  No urinary symptoms.  Denies any other physical complaints.  HPI  Past Medical History Past Medical History:  Diagnosis Date  . Alcohol abuse   . Anxiety   . Bipolar 1 disorder ([place])   . Cancer (HCC)    cervical cancer  . Cirrhosis ([place])   . GERD (gastroesophageal reflux disease)   . Pancreatitis   . PTSD (post-traumatic stress disorder)    Patient Active Problem List   Diagnosis Date Noted  . Acute on chronic pancreatitis ([place]) 03/22/2019  . Bipolar 1 disorder ([place]) 01/13/2019  . Eczema 10/09/2018  . Acute pancreatitis   . Alcohol induced acute pancreatitis 11/22/2017  . Duodenitis 11/22/2017  . Tobacco abuse counseling   . 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 Nondisplaced fracture of neck of fifth metacarpal bone, right hand, initial encounter for closed fracture 08/26/2017  . Alcoholic cirrhosis of liver without ascites ([place]) 10/15/2016  . Alcohol abuse 09/04/2016  . Gastritis and gastroduodenitis 09/04/2016  . Tobacco abuse 09/04/2016  . COPD (chronic obstructive pulmonary disease) ([place]) 09/04/2016  . Elevated liver enzymes 12/22/2014  . Abdominal pain, epigastric 12/16/2014  . Chest pain with moderate risk for cardiac etiology 12/15/2014  . Alcohol dependence ([place]) 12/16/2012    Class: Acute   Home Medication(s) Prior to Admission medications   Medication  Sig Start Date End Date Taking? Authorizing Provider  albuterol (VENTOLIN HFA) 108 (90 Base) MCG/ACT inhaler Inhale 1-2 puffs into the lungs every 6 (six) hours as needed for wheezing or shortness of breath. 01/13/19  Yes [place], Charlane Ferretti, MD  mometasone-formoterol (DULERA) 200-5 MCG/ACT AERO Inhale 2 puffs into the lungs 2 (two) times daily. 01/13/19  Yes Charlott Rakes, MD  Multiple Vitamin (MULTIVITAMIN WITH MINERALS) TABS tablet Take 1 tablet by mouth daily.   Yes [provider]  pantoprazole (PROTONIX) 40 MG tablet Take 40 mg by mouth 2 (two) times daily. 08/09/19  Yes [provider]  cloNIDine (CATAPRES) 0.1 MG tablet Take 1 tablet (0.1 mg total) by mouth at bedtime. For hot flashes Patient not taking: Reported on 10/17/2019 01/13/19   Charlott Rakes, MD  cyclobenzaprine (FLEXERIL) 5 MG tablet Take 1 tablet (5 mg total) by mouth 3 (three) times daily as needed for muscle spasms. Patient not taking: Reported on 10/17/2019 09/05/19   Nuala Alpha, DO  docusate sodium (COLACE) 100 MG capsule Take 1 capsule (100 mg total) by mouth every 12 (twelve) hours. Patient not taking: Reported on 10/17/2019 07/20/19   Augusto Gamble B, NP  famotidine (PEPCID) 20 MG tablet Take 1 tablet (20 mg total) by mouth 2 (two) times daily. Patient not taking: Reported on 10/17/2019 07/31/19   Charlott Rakes, MD  gabapentin (NEURONTIN) 300 MG capsule Take 1 capsule (300 mg total) by mouth at bedtime. Patient not taking: Reported on 10/17/2019 08/25/19   Charlott Rakes, MD  ibuprofen (ADVIL) 400 MG tablet Take 0.5 tablets (200 mg  total) by mouth every 6 (six) hours as needed. Patient not taking: Reported on 10/17/2019 09/05/19 09/04/20  Nuala Alpha, DO  nicotine (NICODERM CQ - DOSED IN MG/24 HOURS) 21 mg/24hr patch Place 1 patch (21 mg total) onto the skin daily. Patient not taking: Reported on 08/25/2019 06/04/18   Charlott Rakes, MD  predniSONE (DELTASONE) 20 MG tablet Take 1 tablet (20 mg total) by mouth  daily with breakfast. Patient not taking: Reported on 10/17/2019 08/25/19   Charlott Rakes, MD  triamcinolone cream (KENALOG) 0.1 % Apply 1 application topically 2 (two) times daily. Patient not taking: Reported on 10/17/2019 08/25/19   Charlott Rakes, MD                                                                                                                                    Past Surgical History Past Surgical History:  Procedure Laterality Date  . ABDOMINAL HYSTERECTOMY  2004  . BIOPSY  01/02/2018   Procedure: BIOPSY;  Surgeon: Milus Banister, MD;  Location: [facility];  Service: Endoscopy;;  . ESOPHAGOGASTRODUODENOSCOPY (EGD) WITH PROPOFOL N/A 01/02/2018   Procedure: ESOPHAGOGASTRODUODENOSCOPY (EGD) WITH PROPOFOL;  Surgeon: Milus Banister, MD;  Location: [facility];  Service: Endoscopy;  Laterality: N/A;  . EUS N/A 01/02/2018   Procedure: UPPER ENDOSCOPIC ULTRASOUND (EUS) RADIAL;  Surgeon: Milus Banister, MD;  Location: [facility];  Service: Endoscopy;  Laterality: N/A;  . FOOT SURGERY    . OTHER SURGICAL HISTORY     ovarian surgery  . TUBAL LIGATION     Family History Family History  Problem Relation Age of Onset  . Hypertension Mother   . Breast cancer Mother 80  . Pancreatitis Mother   . Hypertension Father   . Heart attack Cousin 30       died in 40s of MI  . Breast cancer Maternal Aunt   . Breast cancer Maternal Aunt   . Colon cancer Neg Hx     Social History Social History   Tobacco Use  . Smoking status: Current Every Day Smoker    Packs/day: 0.50    Types: Cigarettes  . Smokeless tobacco: Never Used  . Tobacco comment: 2-3 cigarettes daily  Substance Use Topics  . Alcohol use: Not Currently    Comment: no alchol for the past 3 weeks  . Drug use: No   Allergies Patient has no known allergies.  Review of Systems Review of Systems All other systems are reviewed and are negative for acute change except as noted in the HPI  Physical  Exam Vital Signs  I have reviewed the triage vital signs BP 112/80 (BP Location: Right Arm)   Pulse 82   Temp 97.7 F (36.5 C) (Oral)   Resp 12   Ht 5\' 4"  (1.626 m)   Wt 45.4 kg   SpO2 100%   BMI 17.18 kg/m   Physical Exam Vitals reviewed.  Constitutional:  General: She is not in acute distress.    Appearance: She is well-developed. She is not diaphoretic.  HENT:     Head: Normocephalic and atraumatic.     Nose: Nose normal.  Eyes:     General: No scleral icterus.       Right eye: No discharge.        Left eye: No discharge.     Conjunctiva/sclera: Conjunctivae normal.     Pupils: Pupils are equal, round, and reactive to light.  Cardiovascular:     Rate and Rhythm: Normal rate and regular rhythm.     Heart sounds: No murmur. No friction rub. No gallop.   Pulmonary:     Effort: Pulmonary effort is normal. No respiratory distress.     Breath sounds: Normal breath sounds. No stridor. No rales.  Abdominal:     General: There is no distension.     Palpations: Abdomen is soft.     Tenderness: There is abdominal tenderness in the epigastric area. There is no guarding or rebound.  Musculoskeletal:        General: No tenderness.     Cervical back: Normal range of motion and neck supple.  Skin:    General: Skin is warm and dry.     Findings: No erythema or rash.  Neurological:     Mental Status: She is alert and oriented to person, place, and time.     ED Results and Treatments Labs (all labs ordered are listed, but only abnormal results are displayed) Labs Reviewed  BASIC METABOLIC PANEL - Abnormal; Notable for the following components:      Result Value   Sodium 133 (*)    CO2 20 (*)    BUN <5 (*)    All other components within normal limits  CBC - Abnormal; Notable for the following components:   MCH 34.5 (*)    All other components within normal limits  ETHANOL - Abnormal; Notable for the following components:   Alcohol, Ethyl (B) 252 (*)    All other  components within normal limits  LIPASE, BLOOD - Abnormal; Notable for the following components:   Lipase 57 (*)    All other components within normal limits  HEPATIC FUNCTION PANEL - Abnormal; Notable for the following components:   AST 46 (*)    All other components within normal limits  I-STAT BETA HCG BLOOD, ED ([facility], [facility], [facility] ONLY)  TROPONIN I (HIGH SENSITIVITY)                                                                                                                         EKG  EKG Interpretation  Date/Time:  Saturday October 17 2019 03:08:44 EST Ventricular Rate:  78 PR Interval:  116 QRS Duration: 94 QT Interval:  400 QTC Calculation: 456 R Axis:   11 Text Interpretation: Normal sinus rhythm Incomplete right bundle branch block Borderline ECG No acute changes Confirmed by Addison Lank [phone]) on 10/17/2019 4:53:35  AM      Radiology DG Chest 2 View  Result Date: 10/17/2019 CLINICAL DATA:  Chest and abdominal pain x1 week EXAM: CHEST - 2 VIEW COMPARISON:  March 01, 2019 FINDINGS: The heart size and mediastinal contours are within normal limits. Both lungs are clear. The visualized skeletal structures are unremarkable. IMPRESSION: No active cardiopulmonary disease. Electronically Signed   By: Constance Holster M.D.   On: 10/17/2019 03:47    Pertinent labs & imaging results that were available during my care of the patient were reviewed by me and considered in my medical decision making (see chart for details).  Medications Ordered in ED Medications  sodium chloride flush (NS) 0.9 % injection 3 mL (3 mLs Intravenous Not Given 10/17/19 0332)  metoCLOPramide (REGLAN) injection 10 mg (10 mg Intravenous Given 10/17/19 0506)  alum & mag hydroxide-simeth (MAALOX/MYLANTA) 200-200-20 MG/5ML suspension 30 mL (30 mLs Oral Given 10/17/19 0506)    And  lidocaine (XYLOCAINE) 2 % viscous mouth solution 15 mL (15 mLs Oral Given 10/17/19 0506)                                                                                                                                     Procedures Procedures  (including critical care time)  Medical Decision Making / ED Course I have reviewed the nursing notes for this encounter and the patient's prior records (if available in EHR or on provided paperwork).   Felicia Perkins was evaluated in Emergency Department on 10/17/2019 for the symptoms described in the history of present illness. She was evaluated in the context of the global COVID-19 pandemic, which necessitated consideration that the patient might be at risk for infection with the SARS-CoV-2 virus that causes COVID-19. Institutional protocols and algorithms that pertain to the evaluation of patients at risk for COVID-19 are in a state of rapid change based on information released by regulatory bodies including the CDC and federal and state organizations. These policies and algorithms were followed during the patient's care in the ED.  Upper abdominal pain.  Tenderness to palpation epigastrium.  Similar to prior episodes. Will assess for pancreatitis.  Also considering alcoholic gastritis.  Will assess for biliary disease.  EKG without acute ischemic changes or evidence of pericarditis.  Initial troponin negative.  Given duration of symptoms, feel is sufficient to rule out ACS.  Low suspicion for pulmonary embolism.  Presentation not classic for rated dissection or esophageal perforation.   Chest x-ray without evidence suggestive of pneumonia, pneumothorax, pneumomediastinum.  No abnormal contour of the mediastinum to suggest dissection. No evidence of acute injuries.  Will treat symptomatically while awaiting results.  Lipase mildly elevated but not 3 times upper limit of normal and not consistent with acute pancreatitis.  No evidence of biliary obstruction.  EtOH elevated at 250.  Following Reglan and GI cocktail patient had complete resolution of her symptoms.  Able to tolerate oral  intake.      Final Clinical Impression(s) / ED Diagnoses Final diagnoses:  Upper abdominal pain  History of alcohol use disorder    The patient appears reasonably screened and/or stabilized for discharge and I doubt any other medical condition or other [facility] requiring further screening, evaluation, or treatment in the ED at this time prior to discharge. Safe for discharge with strict return precautions.  Disposition: Discharge  Condition: Good  I have discussed the results, Dx and Tx plan with the patient/family who expressed understanding and agree(s) with the plan. Discharge instructions discussed at length. The patient/family was given strict return precautions who verbalized understanding of the instructions. No further questions at time of discharge.    ED Discharge Orders    None        Follow Up: Charlott Rakes, MD [place] [address] [phone]  Schedule an appointment as soon as possible for a visit  As needed     This chart was dictated using voice recognition software.  Despite best efforts to proofread,  errors can occur which can change the documentation meaning.   Fatima Blank, MD 10/17/19 [phone]

## 2019-10-17 NOTE — ED Triage Notes (Signed)
The pt has had epigastric pain for over one week she is due for an endoscopy this next week  She droinks alcohol every day  Had her last drink at 0000 midnight

## 2019-10-17 NOTE — ED Notes (Signed)
Pt verbalized understanding of d/c instructions, follow up care and s/s requiring return to ed. Pt had no further questions at this time

## 2019-10-20 ENCOUNTER — Ambulatory Visit (AMBULATORY_SURGERY_CENTER): Payer: Medicaid Other | Admitting: Internal Medicine

## 2019-10-20 ENCOUNTER — Encounter: Payer: Self-pay | Admitting: Internal Medicine

## 2019-10-20 ENCOUNTER — Other Ambulatory Visit: Payer: Self-pay

## 2019-10-20 VITALS — BP 123/82 | HR 109 | Temp 97.7°F | Resp 20 | Ht 64.0 in | Wt 100.0 lb

## 2019-10-20 DIAGNOSIS — K2950 Unspecified chronic gastritis without bleeding: Secondary | ICD-10-CM

## 2019-10-20 DIAGNOSIS — K3189 Other diseases of stomach and duodenum: Secondary | ICD-10-CM | POA: Diagnosis not present

## 2019-10-20 DIAGNOSIS — R1013 Epigastric pain: Secondary | ICD-10-CM

## 2019-10-20 DIAGNOSIS — R110 Nausea: Secondary | ICD-10-CM

## 2019-10-20 MED ORDER — SODIUM CHLORIDE 0.9 % IV SOLN: 500.0000 mL | Freq: Once | INTRAVENOUS | Status: DC

## 2019-10-20 NOTE — Progress Notes (Signed)
Called to room to assist during endoscopic procedure.  Patient ID and intended procedure confirmed with present staff. Received instructions for my participation in the procedure from the performing physician.  

## 2019-10-20 NOTE — Progress Notes (Signed)
Addendum: Reviewed and agree with assessment and management plan. Nickole Adamek M, MD  

## 2019-10-20 NOTE — Op Note (Signed)
[facility] Patient Name: Felicia Perkins Procedure Date: 10/20/2019 9:51 AM MRN: [id number] Endoscopist: Jerene Bears , MD Age: 44 Referring MD:  Date of Birth: [birth year] Gender: Female Account #: [id number] Procedure:                Upper GI endoscopy Indications:              Epigastric abdominal pain, Nausea Medicines:                Monitored Anesthesia Care Procedure:                Pre-Anesthesia Assessment:                           - Prior to the procedure, a History and Physical                            was performed, and patient medications and                            allergies were reviewed. The patient's tolerance of                            previous anesthesia was also reviewed. The risks                            and benefits of the procedure and the sedation                            options and risks were discussed with the patient.                            All questions were answered, and informed consent                            was obtained. Prior Anticoagulants: The patient has                            taken no previous anticoagulant or antiplatelet                            agents. ASA Grade Assessment: II - A patient with                            mild systemic disease. After reviewing the risks                            and benefits, the patient was deemed in                            satisfactory condition to undergo the procedure.                           After obtaining informed consent, the endoscope was  passed under direct vision. Throughout the                            procedure, the patient's blood pressure, pulse, and                            oxygen saturations were monitored continuously. The                            Endoscope was introduced through the mouth, and                            advanced to the area of papilla. The upper GI                            endoscopy was accomplished  without difficulty. The                            patient tolerated the procedure well. Scope In: Scope Out: Findings:                 The examined esophagus was normal.                           The entire examined stomach was normal. Biopsies                            were taken with a cold forceps for histology and                            Helicobacter pylori testing.                           The examined duodenum was normal. Complications:            No immediate complications. Estimated Blood Loss:     Estimated blood loss was minimal. Impression:               - Normal esophagus.                           - Normal stomach. Biopsied.                           - Normal examined duodenum. Recommendation:           - Patient has a contact number available for                            emergencies. The signs and symptoms of potential                            delayed complications were discussed with the                            patient. Return to normal activities tomorrow.  Written discharge instructions were provided to the                            patient.                           - Resume previous diet.                           - Continue present medications.                           - Await pathology results. Jerene Bears, MD 10/20/2019 10:20:41 AM This report has been signed electronically.

## 2019-10-20 NOTE — Patient Instructions (Signed)
Read all of the handouts given to you by your recovery room nurse.  Thank-you for choosing [place] for our healthcre needs today.  YOU HAD AN ENDOSCOPIC PROCEDURE TODAY AT [facility]:   Refer to the procedure report that was given to you for any specific questions about what was found during the examination.  If the procedure report does not answer your questions, please call your gastroenterologist to clarify.  If you requested that your care partner not be given the details of your procedure findings, then the procedure report has been included in a sealed envelope for you to review at your convenience later.  YOU SHOULD EXPECT: Some feelings of bloating in the abdomen. Passage of more gas than usual.  Walking can help get rid of the air that was put into your GI tract during the procedure and reduce the bloating.   Please Note:  You might notice some irritation and congestion in your nose or some drainage.  This is from the oxygen used during your procedure.  There is no need for concern and it should clear up in a day or so.  SYMPTOMS TO REPORT IMMEDIATELY:    Following upper endoscopy (EGD)  Vomiting of blood or coffee ground material  New chest pain or pain under the shoulder blades  Painful or persistently difficult swallowing  New shortness of breath  Fever of 100F or higher  Black, tarry-looking stools  For urgent or emergent issues, a gastroenterologist can be reached at any hour by calling [phone]. Do not use MyChart messaging for urgent concerns.    DIET:  We do recommend a small meal at first, but then you may proceed to your regular diet.  Drink plenty of fluids but you should avoid alcoholic beverages for 24 hours.  ACTIVITY:  You should plan to take it easy for the rest of today and you should NOT DRIVE or use heavy machinery until tomorrow (because of the sedation medicines used during the test).    FOLLOW UP: Our staff will call the number listed on  your records 48-72 hours following your procedure to check on you and address any questions or concerns that you may have regarding the information given to you following your procedure. If we do not reach you, we will leave a message.  We will attempt to reach you two times.  During this call, we will ask if you have developed any symptoms of COVID 19. If you develop any symptoms (ie: fever, flu-like symptoms, shortness of breath, cough etc.) before then, please call [phone].  If you test positive for Covid 19 in the 2 weeks post procedure, please call and report this information to [place].    If any biopsies were taken you will be contacted by phone or by letter within the next 1-3 weeks.  Please call us at [phone] if you have not heard about the biopsies in 3 weeks.    SIGNATURES/CONFIDENTIALITY: You and/or your care partner have signed paperwork which will be entered into your electronic medical record.  These signatures attest to the fact that that the information above on your After Visit Summary has been reviewed and is understood.  Full responsibility of the confidentiality of this discharge information lies with you and/or your care-partner.

## 2019-10-20 NOTE — Progress Notes (Signed)
Temp by LC, vitals by DT 

## 2019-10-22 ENCOUNTER — Telehealth: Payer: Self-pay | Admitting: *Deleted

## 2019-10-22 ENCOUNTER — Encounter: Payer: Self-pay | Admitting: Internal Medicine

## 2019-10-22 NOTE — Telephone Encounter (Signed)
  Follow up Call-  Call back number 10/20/2019  Post procedure Call Back phone  # [phone]  Permission to leave phone message Yes  Some recent data might be hidden     Patient questions:  Do you have a fever, pain , or abdominal swelling? No. Pain Score  0 *  Have you tolerated food without any problems? Yes.    Have you been able to return to your normal activities? Yes.    Do you have any questions about your discharge instructions: Diet   No. Medications  No. Follow up visit  No.  Do you have questions or concerns about your Care? No.  Actions: * If pain score is 4 or above: No action needed, pain < 4  1. Have you developed a fever since your procedure? NO  2.   Have you had an respiratory symptoms (SOB or cough) since your procedure? NO  3.   Have you tested positive for COVID 19 since your procedure NO  4.   Have you had any family members/close contacts diagnosed with the COVID 19 since your procedure?  NO   If yes to any of these questions please route to Joylene John, RN and Alphonsa Gin, RN.

## 2019-10-22 NOTE — Telephone Encounter (Signed)
Attempted f/u phone call. No answer. Left message. °

## 2019-11-23 ENCOUNTER — Encounter: Payer: Self-pay | Admitting: Family Medicine

## 2019-12-16 ENCOUNTER — Encounter: Payer: Self-pay | Admitting: Family Medicine

## 2019-12-17 ENCOUNTER — Ambulatory Visit: Payer: Medicaid Other | Attending: Family Medicine | Admitting: Family Medicine

## 2019-12-17 ENCOUNTER — Other Ambulatory Visit: Payer: Self-pay

## 2019-12-17 DIAGNOSIS — L309 Dermatitis, unspecified: Secondary | ICD-10-CM

## 2019-12-17 MED ORDER — TRIAMCINOLONE ACETONIDE 0.1 % EX CREA: 1.0000 "application " | TOPICAL_CREAM | Freq: Two times a day (BID) | CUTANEOUS | 1 refills | Status: DC

## 2019-12-17 NOTE — Progress Notes (Signed)
States that she has eczema and psoriasis.   States that she fell about 3 months ago and she used her hand to break her fall and now she states that her thumb is very painful.

## 2019-12-17 NOTE — Progress Notes (Signed)
Virtual Visit via Telephone Note  I connected with Felicia Perkins, on 12/17/2019 at 10:06 AM by telephone due to the COVID-19 pandemic and verified that I am speaking with the correct person using two identifiers.   Consent: I discussed the limitations, risks, security and privacy concerns of performing an evaluation and management service by telephone and the availability of in person appointments. I also discussed with the patient that there may be a patient responsible charge related to this service. The patient expressed understanding and agreed to proceed.   Location of Patient: Work  Biomedical scientist of Provider: Clinic   Persons participating in Telemedicine visit: Romelia M Treina Mante Farrington-CMA Dr. Margarita Rana     History of Present Illness: Felicia Perkins is a 44 year old female with a history of alcoholic liver cirrhosis, COPD, alcohol abuse, tobacco abusewho presents today for an acute visit. She has itching x1 month and she has lesions on her legs, elbow, hands and back.  Wondering if this could be eczema or psoriasis but has never had a formal diagnosis of this Works in [employer] and her hands are wet all the time.  Currently not using any cream for this.   Past Medical History:  Diagnosis Date  . Alcohol abuse   . Anxiety   . Bipolar 1 disorder ([place])   . Cancer (HCC)    cervical cancer  . Cirrhosis ([place])   . GERD (gastroesophageal reflux disease)   . Pancreatitis   . PTSD (post-traumatic stress disorder)    No Known Allergies  Current Outpatient Medications on File Prior to Visit  Medication Sig Dispense Refill  . albuterol (VENTOLIN HFA) 108 (90 Base) MCG/ACT inhaler Inhale 1-2 puffs into the lungs every 6 (six) hours as needed for wheezing or shortness of breath. 1 Inhaler 3  . famotidine (PEPCID) 20 MG tablet Take 1 tablet (20 mg total) by mouth 2 (two) times daily. 60 tablet 2  . mometasone-formoterol (DULERA) 200-5 MCG/ACT AERO Inhale 2 puffs into  the lungs 2 (two) times daily. 1 Inhaler 3  . pantoprazole (PROTONIX) 40 MG tablet Take 40 mg by mouth 2 (two) times daily.    . cloNIDine (CATAPRES) 0.1 MG tablet Take 1 tablet (0.1 mg total) by mouth at bedtime. For hot flashes (Patient not taking: Reported on 10/17/2019) 30 tablet 3  . cyclobenzaprine (FLEXERIL) 5 MG tablet Take 1 tablet (5 mg total) by mouth 3 (three) times daily as needed for muscle spasms. (Patient not taking: Reported on 10/17/2019) 10 tablet 0  . docusate sodium (COLACE) 100 MG capsule Take 1 capsule (100 mg total) by mouth every 12 (twelve) hours. (Patient not taking: Reported on 10/17/2019) 60 capsule 0  . gabapentin (NEURONTIN) 300 MG capsule Take 1 capsule (300 mg total) by mouth at bedtime. (Patient not taking: Reported on 10/17/2019) 30 capsule 6  . ibuprofen (ADVIL) 400 MG tablet Take 0.5 tablets (200 mg total) by mouth every 6 (six) hours as needed. (Patient not taking: Reported on 10/17/2019) 10 tablet 1  . Multiple Vitamin (MULTIVITAMIN WITH MINERALS) TABS tablet Take 1 tablet by mouth daily.    . nicotine (NICODERM CQ - DOSED IN MG/24 HOURS) 21 mg/24hr patch Place 1 patch (21 mg total) onto the skin daily. (Patient not taking: Reported on 08/25/2019) 28 patch 3  . predniSONE (DELTASONE) 20 MG tablet Take 1 tablet (20 mg total) by mouth daily with breakfast. (Patient not taking: Reported on 10/17/2019) 5 tablet 0  . triamcinolone cream (KENALOG) 0.1 %  Apply 1 application topically 2 (two) times daily. (Patient not taking: Reported on 10/17/2019) 30 g 1   No current facility-administered medications on file prior to visit.    Observations/Objective: Awake, alert Not in acute distress  Assessment and Plan: 1. Dermatitis We will place on topical steroid - triamcinolone cream (KENALOG) 0.1 %; Apply 1 application topically 2 (two) times daily.  Dispense: 30 g; Refill: 1   Follow Up Instructions: She will call the clinic to set up a follow up appointment.   I discussed the  assessment and treatment plan with the patient. The patient was provided an opportunity to ask questions and all were answered. The patient agreed with the plan and demonstrated an understanding of the instructions.   The patient was advised to call back or seek an in-person evaluation if the symptoms worsen or if the condition fails to improve as anticipated.     I provided 11 minutes total of non-face-to-face time during this encounter including median intraservice time, reviewing previous notes, investigations, ordering medications, medical decision making, coordinating care and patient verbalized understanding at the end of the visit.     Charlott Rakes, MD, FAAFP. [facility] [place], [place]   12/17/2019, 10:06 AM

## 2019-12-21 ENCOUNTER — Telehealth: Payer: Self-pay

## 2019-12-21 ENCOUNTER — Encounter: Payer: Self-pay | Admitting: Family Medicine

## 2019-12-21 ENCOUNTER — Other Ambulatory Visit: Payer: Self-pay | Admitting: Family Medicine

## 2019-12-21 MED ORDER — CETIRIZINE HCL 10 MG PO TABS: 10.0000 mg | ORAL_TABLET | Freq: Every day | ORAL | 1 refills | Status: DC

## 2019-12-21 MED ORDER — OLOPATADINE HCL 0.1 % OP SOLN: 1.0000 [drp] | Freq: Two times a day (BID) | OPHTHALMIC | 2 refills | Status: DC

## 2019-12-21 NOTE — Telephone Encounter (Signed)
Patient is requesting medication for Allergies, states that she is having itchy eyes runny nose.

## 2019-12-21 NOTE — Telephone Encounter (Signed)
I have responded to her via my chart.

## 2019-12-22 ENCOUNTER — Telehealth: Payer: Self-pay

## 2019-12-22 MED ORDER — OLOPATADINE HCL 0.1 % OP SOLN: 1.0000 [drp] | Freq: Two times a day (BID) | OPHTHALMIC | 2 refills | Status: DC

## 2019-12-22 NOTE — Telephone Encounter (Signed)
If appropriate, can you resend eye drops for the generic Pataday?  This is preferred under her Medicaid ins.

## 2019-12-22 NOTE — Addendum Note (Signed)
Addended by: Charlott Rakes on: 12/22/2019 09:31 AM   Modules accepted: Orders

## 2019-12-22 NOTE — Telephone Encounter (Signed)
Done

## 2019-12-23 ENCOUNTER — Ambulatory Visit: Payer: Medicaid Other | Attending: Internal Medicine

## 2019-12-23 DIAGNOSIS — Z20822 Contact with and (suspected) exposure to covid-19: Secondary | ICD-10-CM

## 2019-12-24 ENCOUNTER — Encounter: Payer: Self-pay | Admitting: Family Medicine

## 2019-12-24 LAB — SARS-COV-2, NAA 2 DAY TAT

## 2019-12-24 LAB — NOVEL CORONAVIRUS, NAA: SARS-CoV-2, NAA: NOT DETECTED

## 2019-12-26 ENCOUNTER — Encounter: Payer: Self-pay | Admitting: Family Medicine

## 2020-01-10 ENCOUNTER — Encounter: Payer: Self-pay | Admitting: Family Medicine

## 2020-01-12 ENCOUNTER — Telehealth: Payer: Self-pay

## 2020-01-12 DIAGNOSIS — L409 Psoriasis, unspecified: Secondary | ICD-10-CM

## 2020-01-12 NOTE — Telephone Encounter (Signed)
Patient is requesting a referral to a Dermatologist for her psoriasis

## 2020-02-16 IMAGING — CT CT ABD-PELV W/ CM
2 of 5 series · 16 of 46 positions shown, 18 images · IV contrast (Omni 300)
Comparison: None.

CLINICAL DATA: 42 y/o F; left upper quadrant abdominal pain and
nausea.

EXAM:
CT ABDOMEN AND PELVIS WITH CONTRAST
TECHNIQUE: Multidetector CT imaging of the abdomen and pelvis was performed
using the standard protocol following bolus administration of
intravenous contrast.
CONTRAST:  100mL [id number] IOPAMIDOL ([id number]) INJECTION 61%

[Series 3: a/p w/ 5mm · axial · 0.63mm/px · z∈[+781,+1146]mm · 13 of 81 slices shown, 15 images]
[im 4/81  soft-tissue]
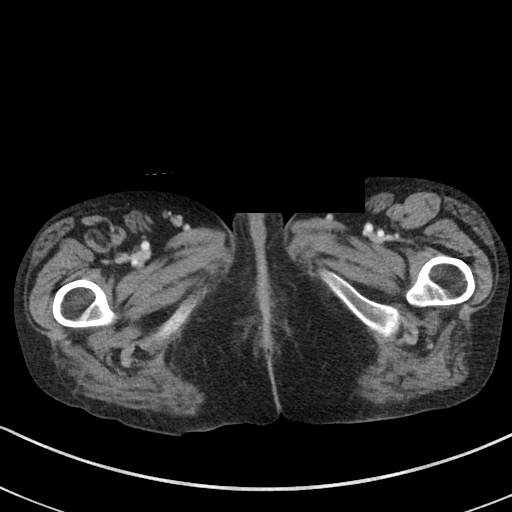
[im 4/81  bone]
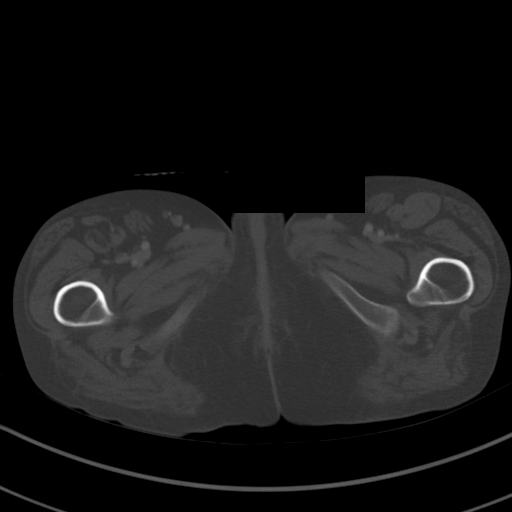
[im 12/81  soft-tissue]
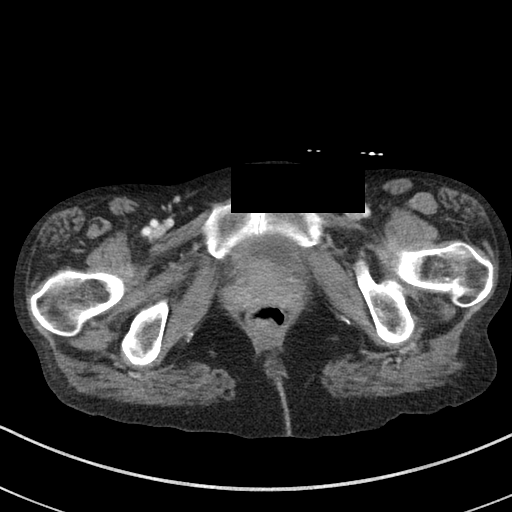
[im 16/81  soft-tissue]
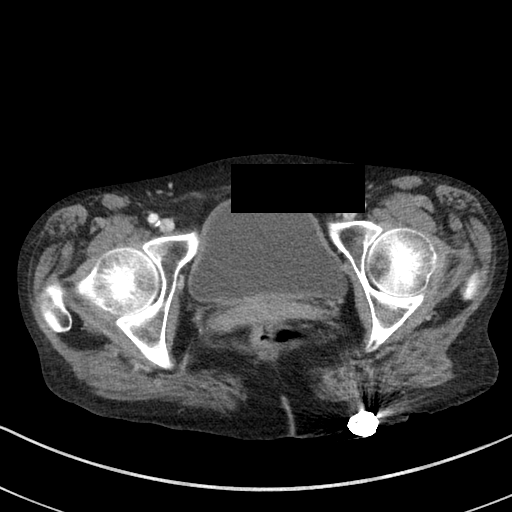
[im 23/81  soft-tissue]
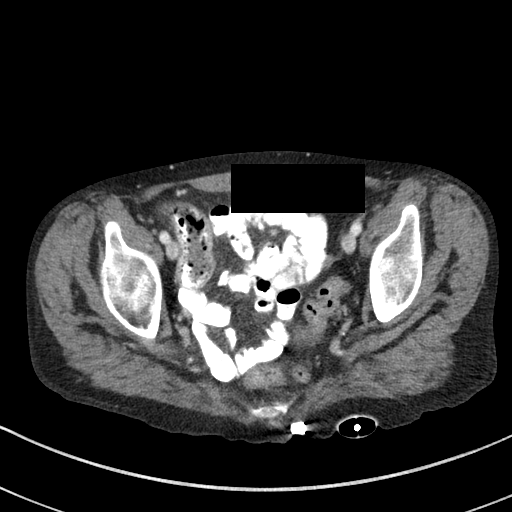
[im 27/81  soft-tissue]
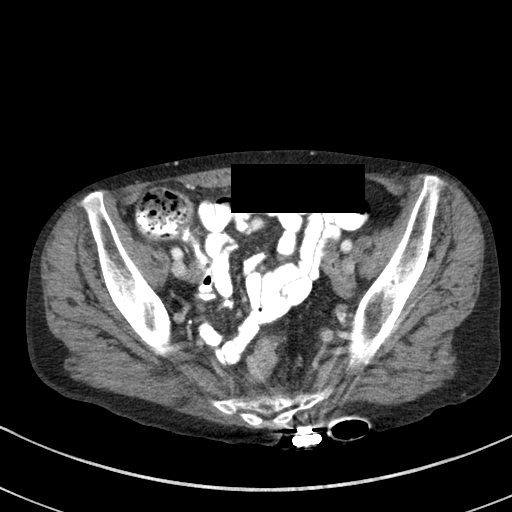
[im 35/81  soft-tissue]
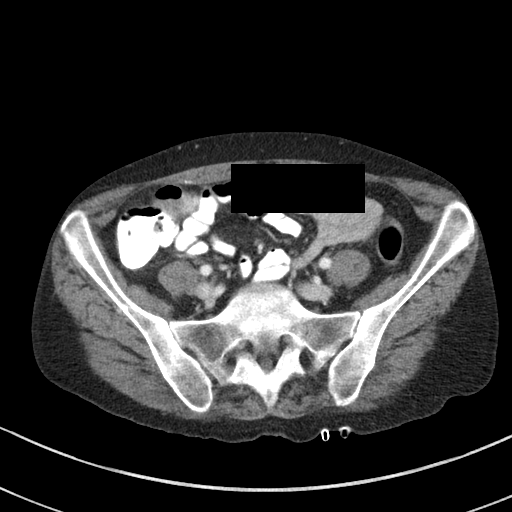
[im 42/81  soft-tissue]
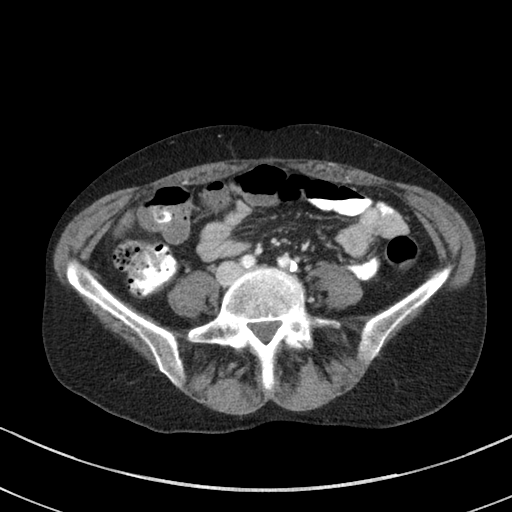
[im 46/81  soft-tissue]
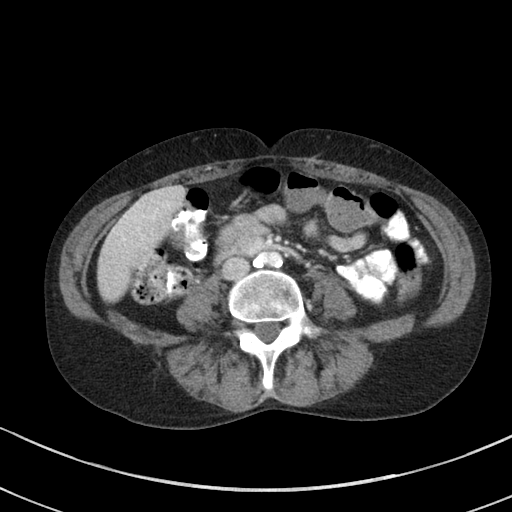
[im 54/81  soft-tissue]
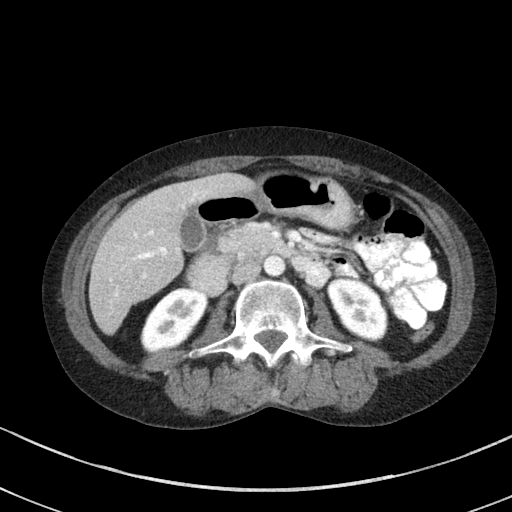
[im 54/81  bone]
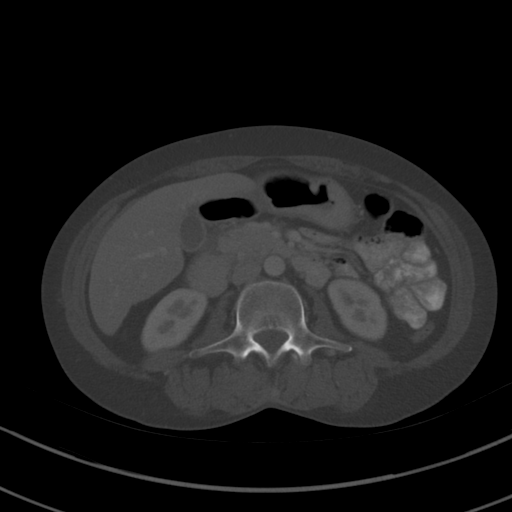
[im 58/81  soft-tissue]
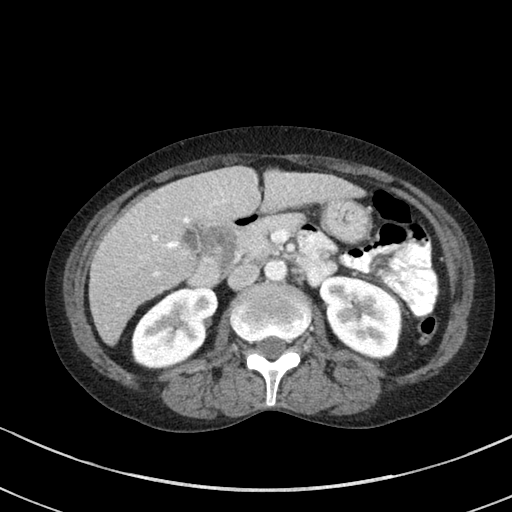
[im 65/81  soft-tissue]
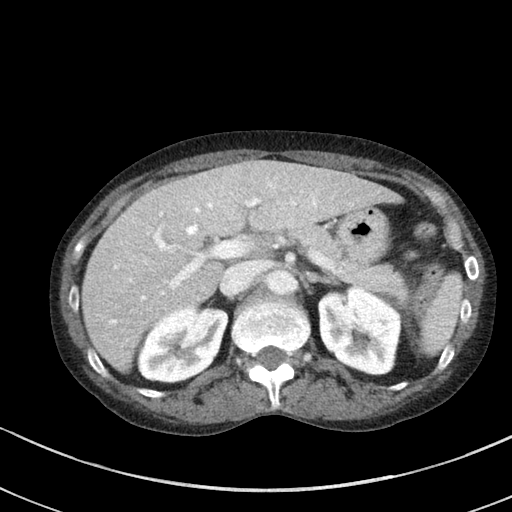
[im 69/81  soft-tissue]
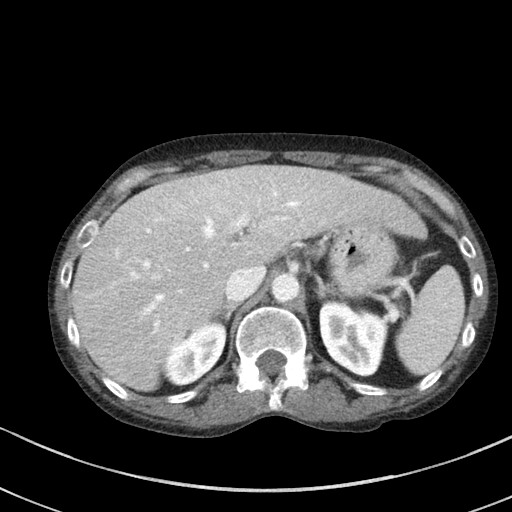
[im 77/81  soft-tissue]
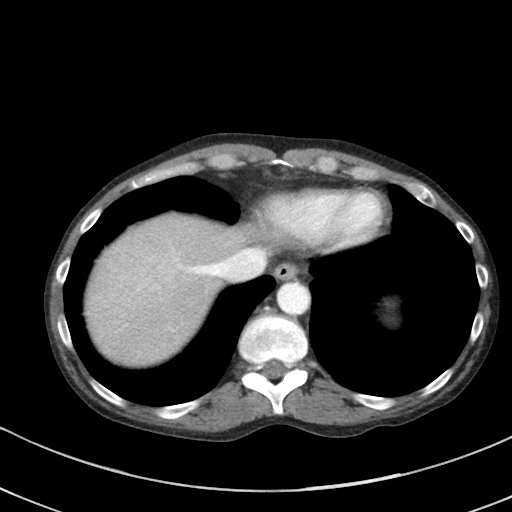

[Series 6: a/p w/ cor · coronal · 0.66mm/px · 3 of 100 slices shown]
[im 34/100  soft-tissue]
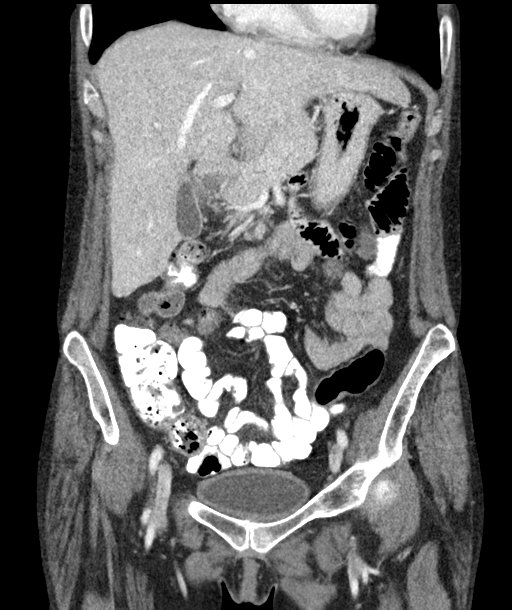
[im 45/100  soft-tissue]
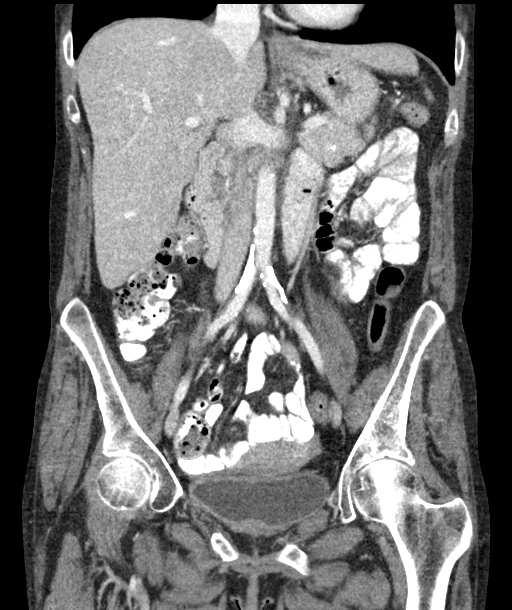
[im 56/100  soft-tissue]
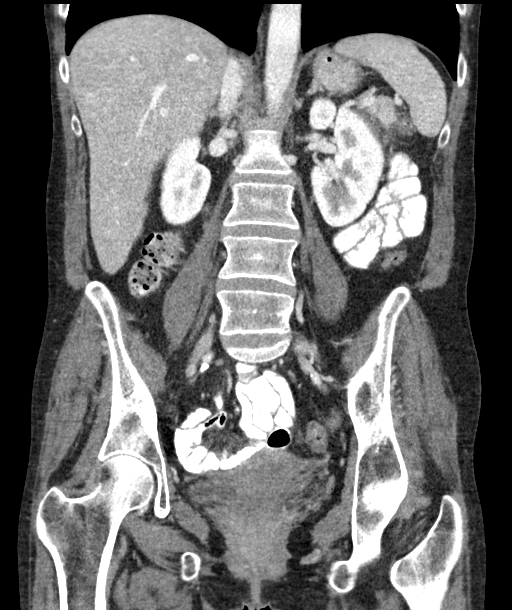

[16 of 46 positions shown; findings below may reference images not displayed]

FINDINGS: Lower chest: No acute abnormality.

Hepatobiliary: No focal liver abnormality is seen. No gallstones,
gallbladder wall thickening, or biliary dilatation.

Pancreas: There is mild edema surrounding head of pancreas and tail
of pancreas. No pancreatic ductal dilatation.

Spleen: Normal in size without focal abnormality.

Adrenals/Urinary Tract: Adrenal glands are unremarkable. Kidneys are
normal, without renal calculi, focal lesion, or hydronephrosis.
Bladder is unremarkable.

Stomach/Bowel: In the region of second segment of
duodenum/pancreatic head there is a round rim enhancing structure
measuring 11 x 10 x 13 mm (AP x ML x CC series 3, image 25 and
series 7, image 61) which may have a neck to the wall of duodenum.

Stomach is within normal limits. Appendix appears normal. Otherwise
no evidence of bowel wall thickening, distention, or inflammatory
changes.

Vascular/Lymphatic: Aortic atherosclerosis. No enlarged abdominal or
pelvic lymph nodes.

Reproductive: Uterus and bilateral adnexa are unremarkable.

Other: No abdominal wall hernia or abnormality. No abdominopelvic
ascites.

Musculoskeletal: No acute or significant osseous findings.
IMPRESSION: Small rim enhancing structure medial to second segment of duodenum
and peripancreatic edema. Findings may represent a penetrating
duodenal ulcer with secondary pancreatitis or acute pancreatitis
with small acute peripancreatic collection.

These results were called by telephone at the time of interpretation
on 11/19/2017 at [DATE] to Dr. Fabrice, who verbally acknowledged
these results.

By: Mhsine Alaoi M.D.

## 2020-02-28 ENCOUNTER — Ambulatory Visit (HOSPITAL_COMMUNITY)
Admission: EM | Admit: 2020-02-28 | Discharge: 2020-02-28 | Disposition: A | Discharge status: Home / Self Care | Payer: Medicaid Other

## 2020-02-28 ENCOUNTER — Emergency Department (HOSPITAL_COMMUNITY): Payer: Medicaid Other

## 2020-02-28 ENCOUNTER — Emergency Department (HOSPITAL_COMMUNITY)
Admission: EM | Admit: 2020-02-28 | Discharge: 2020-02-29 | Disposition: A | Discharge status: Home / Self Care | Payer: Medicaid Other | Attending: Emergency Medicine | Admitting: Emergency Medicine

## 2020-02-28 ENCOUNTER — Encounter (HOSPITAL_COMMUNITY): Payer: Self-pay

## 2020-02-28 DIAGNOSIS — J449 Chronic obstructive pulmonary disease, unspecified: Secondary | ICD-10-CM | POA: Insufficient documentation

## 2020-02-28 DIAGNOSIS — Z79899 Other long term (current) drug therapy: Secondary | ICD-10-CM | POA: Diagnosis not present

## 2020-02-28 DIAGNOSIS — Z8541 Personal history of malignant neoplasm of cervix uteri: Secondary | ICD-10-CM | POA: Diagnosis not present

## 2020-02-28 DIAGNOSIS — R0789 Other chest pain: Secondary | ICD-10-CM | POA: Diagnosis present

## 2020-02-28 DIAGNOSIS — R110 Nausea: Secondary | ICD-10-CM | POA: Insufficient documentation

## 2020-02-28 DIAGNOSIS — R55 Syncope and collapse: Secondary | ICD-10-CM | POA: Diagnosis not present

## 2020-02-28 DIAGNOSIS — F17210 Nicotine dependence, cigarettes, uncomplicated: Secondary | ICD-10-CM | POA: Diagnosis not present

## 2020-02-28 DIAGNOSIS — R079 Chest pain, unspecified: Secondary | ICD-10-CM | POA: Diagnosis not present

## 2020-02-28 DIAGNOSIS — R61 Generalized hyperhidrosis: Secondary | ICD-10-CM | POA: Insufficient documentation

## 2020-02-28 LAB — CBC
HCT: 38.8 % (ref 36.0–46.0)
Hemoglobin: 13.4 g/dL (ref 12.0–15.0)
MCH: 34.4 pg — ABNORMAL HIGH (ref 26.0–34.0)
MCHC: 34.5 g/dL (ref 30.0–36.0)
MCV: 99.7 fL (ref 80.0–100.0)
Platelets: 259 10*3/uL (ref 150–400)
RBC: 3.89 MIL/uL (ref 3.87–5.11)
RDW: 12.6 % (ref 11.5–15.5)
WBC: 7.6 10*3/uL (ref 4.0–10.5)
nRBC: 0 % (ref 0.0–0.2)

## 2020-02-28 LAB — BASIC METABOLIC PANEL
Anion gap: 14 (ref 5–15)
BUN: <5 mg/dL — ABNORMAL LOW (ref 6–20)
CO2: 22 mmol/L (ref 22–32)
Calcium: 9.1 mg/dL (ref 8.9–10.3)
Chloride: 98 mmol/L (ref 98–111)
Creatinine, Ser: 0.4 mg/dL — ABNORMAL LOW (ref 0.44–1.00)
GFR calc Af Amer: >60 mL/min (ref >60)
GFR calc non Af Amer: >60 mL/min (ref >60)
Glucose, Bld: 83 mg/dL (ref 70–99)
Potassium: 4 mmol/L (ref 3.5–5.1)
Sodium: 134 mmol/L — ABNORMAL LOW (ref 135–145)

## 2020-02-28 LAB — I-STAT BETA HCG BLOOD, ED (MC, WL, AP ONLY): I-stat hCG, quantitative: <5 m[IU]/mL (ref <5)

## 2020-02-28 LAB — TROPONIN I (HIGH SENSITIVITY)
Troponin I (High Sensitivity): 4 ng/L (ref <18)
Troponin I (High Sensitivity): 4 ng/L (ref <18)

## 2020-02-28 MED ORDER — SODIUM CHLORIDE 0.9% FLUSH: 3.0000 mL | Freq: Once | INTRAVENOUS | Status: DC

## 2020-02-28 NOTE — ED Triage Notes (Signed)
[facility] w/ c/o 5/10 chest pressure that started yesterday. Pt had 2 syncopal episodes yesterday and also endorses n/v yesterday. Today pt states she just feels "weak and tired in addition to the chest pain. Pt has hx of COPD but denies cardiac hx.

## 2020-02-28 NOTE — ED Triage Notes (Signed)
Pt states she passed out x2 yesterday after getting off work at approx 1230 pm.  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  PERRLA, grips equal/strong, no arm drift, leg strength equal/strong.  Pt feel ongoing weakness today.  Per Dr. Meda Coffee, pt to go to ER STAT for higher level eval/tx.  Patient is being discharged from the Urgent Care and sent to the Emergency Department via [facility] . Per Dr. Meda Coffee patient is in need of higher level of care due to syncope, weakness. Patient is aware and verbalizes understanding of plan of care.  Vitals:   02/28/20 1750  BP: (!) 144/91  Pulse: 78  Resp: 18  Temp: 98.2 F (36.8 C)  SpO2: 100%

## 2020-02-29 ENCOUNTER — Other Ambulatory Visit: Payer: Self-pay

## 2020-02-29 MED ORDER — PANTOPRAZOLE SODIUM 40 MG PO TBEC
40.0000 mg | DELAYED_RELEASE_TABLET | Freq: Two times a day (BID) | ORAL | 0 refills | Status: DC
Start: 2020-02-29 — End: 2020-03-31

## 2020-02-29 NOTE — ED Provider Notes (Signed)
[facility] EMERGENCY DEPARTMENT Provider Note   CSN: [id number] Arrival date & time: 02/28/20  1801     History Chief Complaint  Patient presents with   Chest Pain    Felicia Perkins is a 44 y.o. female.  HPI  HPI: A 44 year old patient with a history of hypertension presents for evaluation of chest pain. Initial onset of pain was more than 6 hours ago. The patient's chest pain is described as heaviness/pressure/tightness and is not worse with exertion. The patient complains of nausea and reports some diaphoresis. The patient's chest pain is middle- or left-sided, is not well-localized, is not sharp and does not radiate to the arms/jaw/neck. The patient has no history of stroke, has no history of peripheral artery disease, has not smoked in the past 90 days, denies any history of treated diabetes, has no relevant family history of coronary artery disease (first degree relative at less than age 71), has no history of hypercholesterolemia and does not have an elevated BMI (>=30).  Pt states the sx started on Saturday.  She has been having intermittent sx since then. Nothing in particular brings it on.  It just comes and goes.  She had two episodes of syncope on Saturday.  No further episodes on Sunday.  Pt has been watiing a very long time in the ED and no episodes while she has been here.  Before the passing out spells she did feel hot and clammy.    No blood in stool.   Past Medical History:  Diagnosis Date   Alcohol abuse    Anxiety    Bipolar 1 disorder ([place])    Cancer ([place])    cervical cancer   Cirrhosis ([place])    GERD (gastroesophageal reflux disease)    Pancreatitis    PTSD (post-traumatic stress disorder)     Patient Active Problem List   Diagnosis Date Noted   Acute on chronic pancreatitis ([place]) 03/22/2019   Bipolar 1 disorder ([place]) 01/13/2019   Eczema 10/09/2018   Acute pancreatitis    Alcohol induced acute pancreatitis 11/22/2017    Duodenitis 11/22/2017   Tobacco abuse counseling    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   Nondisplaced fracture of neck of fifth metacarpal bone, right hand, initial encounter for closed fracture 40/34/7425   Alcoholic cirrhosis of liver without ascites ([place]) 10/15/2016   Alcohol abuse 09/04/2016   Gastritis and gastroduodenitis 09/04/2016   Tobacco abuse 09/04/2016   COPD (chronic obstructive pulmonary disease) ([place]) 09/04/2016   Elevated liver enzymes 12/22/2014   Abdominal pain, epigastric 12/16/2014   Chest pain with moderate risk for cardiac etiology 12/15/2014   Alcohol dependence ([place]) 12/16/2012    Class: Acute    Past Surgical History:  Procedure Laterality Date   ABDOMINAL HYSTERECTOMY  2004   BIOPSY  01/02/2018   Procedure: BIOPSY;  Surgeon: Milus Banister, MD;  Location: [facility];  Service: Endoscopy;;   ESOPHAGOGASTRODUODENOSCOPY (EGD) WITH PROPOFOL N/A 01/02/2018   Procedure: ESOPHAGOGASTRODUODENOSCOPY (EGD) WITH PROPOFOL;  Surgeon: Milus Banister, MD;  Location: [facility];  Service: Endoscopy;  Laterality: N/A;   EUS N/A 01/02/2018   Procedure: UPPER ENDOSCOPIC ULTRASOUND (EUS) RADIAL;  Surgeon: Milus Banister, MD;  Location: [facility] END[facility]COPY;  Service: Endoscopy;  Laterality: N/A;   FOOT SURGERY     OTHER SURGICAL HISTORY     ovarian surgery   TUBAL LIGATION       OB History   No obstetric history on file.     Family History  Problem Relation Age of Onset   Hypertension Mother    Breast cancer Mother 21   Pancreatitis Mother    Hypertension Father    Heart attack Cousin 35       died in 16s of MI   Breast cancer Maternal Aunt    Breast cancer Maternal Aunt    Colon cancer Neg Hx    Esophageal cancer Neg Hx    Rectal cancer Neg Hx    Stomach cancer Neg Hx     Social History   Tobacco Use   Smoking status: Current Every Day Smoker    Packs/day: 0.50    Types: Cigarettes   Smokeless tobacco: Never Used    Tobacco comment: 2-3 cigarettes daily  Vaping Use   Vaping Use: Never used  Substance Use Topics   Alcohol use: Not Currently    Comment: no alchol for the past 3 weeks   Drug use: No    Home Medications Prior to Admission medications   Medication Sig Start Date End Date Taking? Authorizing Provider  albuterol (VENTOLIN HFA) 108 (90 Base) MCG/ACT inhaler Inhale 1-2 puffs into the lungs every 6 (six) hours as needed for wheezing or shortness of breath. 01/13/19   Charlott Rakes, MD  cetirizine (ZYRTEC) 10 MG tablet Take 1 tablet (10 mg total) by mouth daily. 12/21/19   Charlott Rakes, MD  cloNIDine (CATAPRES) 0.1 MG tablet Take 1 tablet (0.1 mg total) by mouth at bedtime. For hot flashes Patient not taking: Reported on 10/17/2019 01/13/19   Charlott Rakes, MD  cyclobenzaprine (FLEXERIL) 5 MG tablet Take 1 tablet (5 mg total) by mouth 3 (three) times daily as needed for muscle spasms. Patient not taking: Reported on 10/17/2019 09/05/19   Nuala Alpha, DO  docusate sodium (COLACE) 100 MG capsule Take 1 capsule (100 mg total) by mouth every 12 (twelve) hours. Patient not taking: Reported on 10/17/2019 07/20/19   Augusto Gamble B, NP  famotidine (PEPCID) 20 MG tablet Take 1 tablet (20 mg total) by mouth 2 (two) times daily. 07/31/19   Charlott Rakes, MD  gabapentin (NEURONTIN) 300 MG capsule Take 1 capsule (300 mg total) by mouth at bedtime. Patient not taking: Reported on 10/17/2019 08/25/19   Charlott Rakes, MD  ibuprofen (ADVIL) 400 MG tablet Take 0.5 tablets (200 mg total) by mouth every 6 (six) hours as needed. Patient not taking: Reported on 10/17/2019 09/05/19 09/04/20  Nuala Alpha, DO  mometasone-formoterol (DULERA) 200-5 MCG/ACT AERO Inhale 2 puffs into the lungs 2 (two) times daily. 01/13/19   Charlott Rakes, MD  Multiple Vitamin (MULTIVITAMIN WITH MINERALS) TABS tablet Take 1 tablet by mouth daily.    [provider]  nicotine (NICODERM CQ - DOSED IN MG/24 HOURS) 21 mg/24hr  patch Place 1 patch (21 mg total) onto the skin daily. Patient not taking: Reported on 08/25/2019 06/04/18   Charlott Rakes, MD  olopatadine (PATADAY) 0.1 % ophthalmic solution Place 1 drop into both eyes 2 (two) times daily. 12/22/19   Charlott Rakes, MD  olopatadine (PATANOL) 0.1 % ophthalmic solution Place 1 drop into both eyes 2 (two) times daily. 12/21/19   Charlott Rakes, MD  pantoprazole (PROTONIX) 40 MG tablet Take 1 tablet (40 mg total) by mouth 2 (two) times daily. 02/29/20   Dorie Rank, MD  predniSONE (DELTASONE) 20 MG tablet Take 1 tablet (20 mg total) by mouth daily with breakfast. Patient not taking: Reported on 10/17/2019 08/25/19   Charlott Rakes, MD  triamcinolone cream (KENALOG) 0.1 % Apply  1 application topically 2 (two) times daily. 12/17/19   Charlott Rakes, MD    Allergies    Patient has no known allergies.  Review of Systems   Review of Systems  Respiratory: Positive for cough.        Occasional cough  All other systems reviewed and are negative.   Physical Exam Updated Vital Signs BP (!) 156/95    Pulse 76    Temp 98.7 F (37.1 C) (Oral)    Resp 17    Ht 1.626 m (5\' 4" )    Wt 50.3 kg    SpO2 99%    BMI 19.05 kg/m   Physical Exam Vitals and nursing note reviewed.  Constitutional:      General: She is not in acute distress.    Appearance: She is well-developed.  HENT:     Head: Normocephalic and atraumatic.     Right Ear: External ear normal.     Left Ear: External ear normal.  Eyes:     General: No scleral icterus.       Right eye: No discharge.        Left eye: No discharge.     Conjunctiva/sclera: Conjunctivae normal.  Neck:     Trachea: No tracheal deviation.  Cardiovascular:     Rate and Rhythm: Normal rate and regular rhythm.  Pulmonary:     Effort: Pulmonary effort is normal. No respiratory distress.     Breath sounds: Normal breath sounds. No stridor. No wheezing or rales.  Abdominal:     General: Bowel sounds are normal. There is no  distension.     Palpations: Abdomen is soft.     Tenderness: There is no abdominal tenderness. There is no guarding or rebound.  Musculoskeletal:        General: No tenderness.     Cervical back: Neck supple.  Skin:    General: Skin is warm and dry.     Findings: No rash.  Neurological:     Mental Status: She is alert.     Cranial Nerves: No cranial nerve deficit (no facial droop, extraocular movements intact, no slurred speech).     Sensory: No sensory deficit.     Motor: No abnormal muscle tone or seizure activity.     Coordination: Coordination normal.     ED Results / Procedures / Treatments   Labs (all labs ordered are listed, but only abnormal results are displayed) Labs Reviewed  BASIC METABOLIC PANEL - Abnormal; Notable for the following components:      Result Value   Sodium 134 (*)    BUN <5 (*)    Creatinine, Ser 0.40 (*)    All other components within normal limits  CBC - Abnormal; Notable for the following components:   MCH 34.4 (*)    All other components within normal limits  I-STAT BETA HCG BLOOD, ED ([facility], [facility], [facility] ONLY)  TROPONIN I (HIGH SENSITIVITY)  TROPONIN I (HIGH SENSITIVITY)    EKG EKG Interpretation  Date/Time:  Sunday February 28 2020 18:12:16 EDT Ventricular Rate:  72 PR Interval:  102 QRS Duration: 88 QT Interval:  406 QTC Calculation: 444 R Axis:   -3 Text Interpretation: Sinus rhythm with short PR Otherwise normal ECG No significant change since last tracing Confirmed by Dorie Rank [phone]) on 02/29/2020 8:53:34 AM   Radiology DG Chest 2 View  Result Date: 02/28/2020 CLINICAL DATA:  Chest pain EXAM: CHEST - 2 VIEW COMPARISON:  None. FINDINGS: The heart size and  mediastinal contours are within normal limits. Both lungs are clear. The visualized skeletal structures are unremarkable. IMPRESSION: No active cardiopulmonary disease. Electronically Signed   By: Prudencio Pair M.D.   On: 02/28/2020 18:51    Procedures Procedures (including critical  care time)  Medications Ordered in ED Medications  sodium chloride flush (NS) 0.9 % injection 3 mL (has no administration in time range)    ED Course  I have reviewed the triage vital signs and the nursing notes.  Pertinent labs & imaging results that were available during my care of the patient were reviewed by me and considered in my medical decision making (see chart for details).  Clinical Course as of Mar 01 919  Mon Feb 29, 2020  0859 Labs reviewed.  Electrolyte panel unremarkable.  CBC normal.  Initial troponin normal   [JK]  0859 Chest x-ray without acute findings   [JK]    Clinical Course User Index [JK] Dorie Rank, MD   MDM Rules/Calculators/A&P HEAR Score: 4                       Patient presented to the ED for evaluation of chest pain as well as syncopal episode.  Patient had syncopal episodes on Saturday.  She states she was at work and was feeling overheated.  She is not sure if she got dehydrated.  Patient has not had any episodes now in the last 36 hours.  Her ED work-up is reassuring.  She is not anemic.  She is not hypotensive.  No signs to suggest severe dehydration or GI bleeding.  EKG shows a normal rhythm.  Symptoms may have been related to dehydration versus vasovagal syncope.  Patient also complained of chest pain.  She did have some nausea and vomiting associated with that.  Patient's had a mild cough but no fevers or chills.  Chest x-ray does not show pneumonia.  Doubt PE.  PERC negative.  Symptoms could be related to acid reflux.  At this time I have low suspicion for acute coronary syndrome.  She has moderate risk heart score but serial troponins are negative.  We will have the patient start antacid.  Follow-up with her primary care doctor.  Return as needed for worsening symptoms. Final Clinical Impression(s) / ED Diagnoses Final diagnoses:  Chest pain, unspecified type    Rx / DC Orders ED Discharge Orders         Ordered    pantoprazole (PROTONIX) 40  MG tablet  2 times daily     Discontinue  Reprint     02/29/20 7169           Dorie Rank, MD 02/29/20 [phone]

## 2020-02-29 NOTE — Discharge Instructions (Addendum)
Take the medications as prescribed.  Follow-up with your primary care doctor this week to be rechecked if the symptoms persist.  Return as needed for worsening symptoms

## 2020-03-31 ENCOUNTER — Other Ambulatory Visit: Payer: Self-pay | Admitting: Family Medicine

## 2020-03-31 DIAGNOSIS — J438 Other emphysema: Secondary | ICD-10-CM

## 2020-03-31 NOTE — Telephone Encounter (Signed)
Medication Refill - Medication:  albuterol (VENTOLIN HFA) 108 (90 Base pantoprazole (PROTONIX) 40 MG tablet  Pt is going out of town tomorrow at 5 and needs this med before she leaves   Has the patient contacted their pharmacy? No. (Agent: If no, request that the patient contact the pharmacy for the refill.) (Agent: If yes, when and what did the pharmacy advise?)  Preferred Pharmacy (with phone number or street name): [facility] on [address]   Agent: Please be advised that RX refills may take up to 3 business days. We ask that you follow-up with your pharmacy.

## 2020-04-01 MED ORDER — ALBUTEROL SULFATE HFA 108 (90 BASE) MCG/ACT IN AERS
1.0000 | INHALATION_SPRAY | Freq: Four times a day (QID) | RESPIRATORY_TRACT | 2 refills | Status: DC | PRN
Start: 2020-04-01 — End: 2021-11-01

## 2020-04-01 MED ORDER — PANTOPRAZOLE SODIUM 40 MG PO TBEC: 40.0000 mg | DELAYED_RELEASE_TABLET | Freq: Two times a day (BID) | ORAL | 0 refills | Status: DC

## 2020-04-09 ENCOUNTER — Encounter: Payer: Self-pay | Admitting: Family Medicine

## 2020-05-10 ENCOUNTER — Other Ambulatory Visit: Payer: Self-pay | Admitting: Family Medicine

## 2020-05-10 ENCOUNTER — Encounter: Payer: Self-pay | Admitting: Family Medicine

## 2020-05-10 DIAGNOSIS — K7030 Alcoholic cirrhosis of liver without ascites: Secondary | ICD-10-CM

## 2020-05-10 MED ORDER — FAMOTIDINE 20 MG PO TABS: 20.0000 mg | ORAL_TABLET | Freq: Two times a day (BID) | ORAL | 2 refills | Status: DC

## 2020-05-13 ENCOUNTER — Emergency Department (HOSPITAL_COMMUNITY)
Admission: EM | Admit: 2020-05-13 | Discharge: 2020-05-13 | Disposition: A | Discharge status: Home / Self Care | Payer: Medicaid Other | Attending: Emergency Medicine | Admitting: Emergency Medicine

## 2020-05-13 ENCOUNTER — Emergency Department (HOSPITAL_COMMUNITY): Payer: Medicaid Other

## 2020-05-13 ENCOUNTER — Other Ambulatory Visit: Payer: Self-pay

## 2020-05-13 DIAGNOSIS — C539 Malignant neoplasm of cervix uteri, unspecified: Secondary | ICD-10-CM | POA: Diagnosis not present

## 2020-05-13 DIAGNOSIS — Z7951 Long term (current) use of inhaled steroids: Secondary | ICD-10-CM | POA: Insufficient documentation

## 2020-05-13 DIAGNOSIS — K219 Gastro-esophageal reflux disease without esophagitis: Secondary | ICD-10-CM | POA: Diagnosis not present

## 2020-05-13 DIAGNOSIS — R10816 Epigastric abdominal tenderness: Secondary | ICD-10-CM | POA: Insufficient documentation

## 2020-05-13 DIAGNOSIS — K861 Other chronic pancreatitis: Secondary | ICD-10-CM

## 2020-05-13 DIAGNOSIS — J449 Chronic obstructive pulmonary disease, unspecified: Secondary | ICD-10-CM | POA: Insufficient documentation

## 2020-05-13 DIAGNOSIS — F17210 Nicotine dependence, cigarettes, uncomplicated: Secondary | ICD-10-CM | POA: Diagnosis not present

## 2020-05-13 DIAGNOSIS — R112 Nausea with vomiting, unspecified: Secondary | ICD-10-CM | POA: Diagnosis present

## 2020-05-13 LAB — URINALYSIS, ROUTINE W REFLEX MICROSCOPIC
Bilirubin Urine: NEGATIVE
Glucose, UA: NEGATIVE mg/dL
Hgb urine dipstick: NEGATIVE
Ketones, ur: NEGATIVE mg/dL
Leukocytes,Ua: NEGATIVE
Nitrite: NEGATIVE
Protein, ur: NEGATIVE mg/dL
Specific Gravity, Urine: 1.013 (ref 1.005–1.030)
pH: 7 (ref 5.0–8.0)

## 2020-05-13 LAB — COMPREHENSIVE METABOLIC PANEL
ALT: 23 U/L (ref 0–44)
AST: 34 U/L (ref 15–41)
Albumin: 3.9 g/dL (ref 3.5–5.0)
Alkaline Phosphatase: 86 U/L (ref 38–126)
Anion gap: 11 (ref 5–15)
BUN: <5 mg/dL — ABNORMAL LOW (ref 6–20)
CO2: 23 mmol/L (ref 22–32)
Calcium: 9.8 mg/dL (ref 8.9–10.3)
Chloride: 104 mmol/L (ref 98–111)
Creatinine, Ser: 0.47 mg/dL (ref 0.44–1.00)
GFR calc Af Amer: >60 mL/min (ref >60)
GFR calc non Af Amer: >60 mL/min (ref >60)
Glucose, Bld: 109 mg/dL — ABNORMAL HIGH (ref 70–99)
Potassium: 3.8 mmol/L (ref 3.5–5.1)
Sodium: 138 mmol/L (ref 135–145)
Total Bilirubin: 0.7 mg/dL (ref 0.3–1.2)
Total Protein: 7.2 g/dL (ref 6.5–8.1)

## 2020-05-13 LAB — CBC
HCT: 44.5 % (ref 36.0–46.0)
Hemoglobin: 15.1 g/dL — ABNORMAL HIGH (ref 12.0–15.0)
MCH: 34.3 pg — ABNORMAL HIGH (ref 26.0–34.0)
MCHC: 33.9 g/dL (ref 30.0–36.0)
MCV: 101.1 fL — ABNORMAL HIGH (ref 80.0–100.0)
Platelets: 207 10*3/uL (ref 150–400)
RBC: 4.4 MIL/uL (ref 3.87–5.11)
RDW: 12 % (ref 11.5–15.5)
WBC: 7.2 10*3/uL (ref 4.0–10.5)
nRBC: 0 % (ref 0.0–0.2)

## 2020-05-13 LAB — I-STAT BETA HCG BLOOD, ED (MC, WL, AP ONLY): I-stat hCG, quantitative: <5 m[IU]/mL (ref <5)

## 2020-05-13 LAB — LIPASE, BLOOD: Lipase: 61 U/L — ABNORMAL HIGH (ref 11–51)

## 2020-05-13 MED ORDER — ONDANSETRON 4 MG PO TBDP
4.0000 mg | ORAL_TABLET | Freq: Three times a day (TID) | ORAL | 0 refills | Status: DC | PRN
Start: 2020-05-13 — End: 2020-10-24

## 2020-05-13 MED ORDER — ONDANSETRON HCL 4 MG/2ML IJ SOLN
4.0000 mg | Freq: Once | INTRAMUSCULAR | Status: AC
  Administered 2020-05-13: 4 mg via INTRAVENOUS
  Filled 2020-05-13: qty 2

## 2020-05-13 MED ORDER — IOHEXOL 300 MG/ML  SOLN
100.0000 mL | Freq: Once | INTRAMUSCULAR | Status: AC | PRN
  Administered 2020-05-13: 100 mL via INTRAVENOUS

## 2020-05-13 MED ORDER — SODIUM CHLORIDE 0.9 % IV BOLUS
1000.0000 mL | Freq: Once | INTRAVENOUS | Status: AC
  Administered 2020-05-13: 1000 mL via INTRAVENOUS

## 2020-05-13 MED ORDER — MORPHINE SULFATE (PF) 4 MG/ML IV SOLN
4.0000 mg | Freq: Once | INTRAVENOUS | Status: AC
  Administered 2020-05-13: 4 mg via INTRAVENOUS
  Filled 2020-05-13: qty 1

## 2020-05-13 NOTE — ED Triage Notes (Signed)
Per pt she has been having abdominal pain with  Diarrhea and N/V x 2 weeks. She has not been able to eat or drink and keep anything down. Pt said pain is upper right quadrant and radiates in to her back.Hx of pancreatitis. No fevers

## 2020-05-13 NOTE — Discharge Instructions (Addendum)
Take Zofran as needed for nausea.  Recommend bland diet, avoid spicy, fatty foods.  Please get an appointment with your primary doctor to be seen early next week.  If you develop uncontrolled nausea, vomiting, worsening abdominal pain, return to ER for reassessment.

## 2020-05-13 NOTE — ED Provider Notes (Signed)
[facility] Provider Note   CSN: [id number] Arrival date & time: 05/13/20  0516     History Chief Complaint  Patient presents with  . Abdominal Pain    Felicia Perkins is a 44 y.o. female.  Presents ER with concern for nausea vomiting, abdominal pain.  She reports that she has been having symptoms for approximately 2 weeks, initially symptoms were relatively mild however the last few days has been having worsening symptoms.  Pain is primarily in her upper abdomen, radiates all over.  No back pain.  Vomit is nonbloody nonbilious, mostly water. No fevers, chest pain, shortness of breath.  HPI     Past Medical History:  Diagnosis Date  . Alcohol abuse   . Anxiety   . Bipolar 1 disorder ([place])   . Cancer (HCC)    cervical cancer  . Cirrhosis ([place])   . GERD (gastroesophageal reflux disease)   . Pancreatitis   . PTSD (post-traumatic stress disorder)     Patient Active Problem List   Diagnosis Date Noted  . Acute on chronic pancreatitis ([place]) 03/22/2019  . Bipolar 1 disorder ([place]) 01/13/2019  . Eczema 10/09/2018  . Acute pancreatitis   . Alcohol induced acute pancreatitis 11/22/2017  . Duodenitis 11/22/2017  . Tobacco abuse counseling   . 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 Nondisplaced fracture of neck of fifth metacarpal bone, right hand, initial encounter for closed fracture 08/26/2017  . Alcoholic cirrhosis of liver without ascites ([place]) 10/15/2016  . Alcohol abuse 09/04/2016  . Gastritis and gastroduodenitis 09/04/2016  . Tobacco abuse 09/04/2016  . COPD (chronic obstructive pulmonary disease) ([place]) 09/04/2016  . Elevated liver enzymes 12/22/2014  . Abdominal pain, epigastric 12/16/2014  . Chest pain with moderate risk for cardiac etiology 12/15/2014  . Alcohol dependence ([place]) 12/16/2012    Class: Acute    Past Surgical History:  Procedure Laterality Date  . ABDOMINAL HYSTERECTOMY  2004  . BIOPSY  01/02/2018    Procedure: BIOPSY;  Surgeon: Milus Banister, MD;  Location: [facility];  Service: Endoscopy;;  . ESOPHAGOGASTRODUODENOSCOPY (EGD) WITH PROPOFOL N/A 01/02/2018   Procedure: ESOPHAGOGASTRODUODENOSCOPY (EGD) WITH PROPOFOL;  Surgeon: Milus Banister, MD;  Location: [facility];  Service: Endoscopy;  Laterality: N/A;  . EUS N/A 01/02/2018   Procedure: UPPER ENDOSCOPIC ULTRASOUND (EUS) RADIAL;  Surgeon: Milus Banister, MD;  Location: [facility];  Service: Endoscopy;  Laterality: N/A;  . FOOT SURGERY    . OTHER SURGICAL HISTORY     ovarian surgery  . TUBAL LIGATION       OB History   No obstetric history on file.     Family History  Problem Relation Age of Onset  . Hypertension Mother   . Breast cancer Mother 48  . Pancreatitis Mother   . Hypertension Father   . Heart attack Cousin 30       died in 58s of MI  . Breast cancer Maternal Aunt   . Breast cancer Maternal Aunt   . Colon cancer Neg Hx   . Esophageal cancer Neg Hx   . Rectal cancer Neg Hx   . Stomach cancer Neg Hx     Social History   Tobacco Use  . Smoking status: Current Every Day Smoker    Packs/day: 0.50    Types: Cigarettes  . Smokeless tobacco: Never Used  . Tobacco comment: 2-3 cigarettes daily  Vaping Use  . Vaping Use: Never used  Substance Use Topics  . Alcohol  use: Not Currently    Comment: no alchol for the past 3 weeks  . Drug use: No    Home Medications Prior to Admission medications   Medication Sig Start Date End Date Taking? Authorizing Provider  albuterol (VENTOLIN HFA) 108 (90 Base) MCG/ACT inhaler Inhale 1-2 puffs into the lungs every 6 (six) hours as needed for wheezing or shortness of breath. 04/01/20   Charlott Rakes, MD  cetirizine (ZYRTEC) 10 MG tablet Take 1 tablet (10 mg total) by mouth daily. 12/21/19   Charlott Rakes, MD  cloNIDine (CATAPRES) 0.1 MG tablet Take 1 tablet (0.1 mg total) by mouth at bedtime. For hot flashes Patient not taking: Reported on 10/17/2019 01/13/19    Charlott Rakes, MD  cyclobenzaprine (FLEXERIL) 5 MG tablet Take 1 tablet (5 mg total) by mouth 3 (three) times daily as needed for muscle spasms. Patient not taking: Reported on 10/17/2019 09/05/19   Nuala Alpha, DO  docusate sodium (COLACE) 100 MG capsule Take 1 capsule (100 mg total) by mouth every 12 (twelve) hours. Patient not taking: Reported on 10/17/2019 07/20/19   Augusto Gamble B, NP  famotidine (PEPCID) 20 MG tablet Take 1 tablet (20 mg total) by mouth 2 (two) times daily. 05/10/20   Charlott Rakes, MD  gabapentin (NEURONTIN) 300 MG capsule Take 1 capsule (300 mg total) by mouth at bedtime. Patient not taking: Reported on 10/17/2019 08/25/19   Charlott Rakes, MD  ibuprofen (ADVIL) 400 MG tablet Take 0.5 tablets (200 mg total) by mouth every 6 (six) hours as needed. Patient not taking: Reported on 10/17/2019 09/05/19 09/04/20  Nuala Alpha, DO  mometasone-formoterol (DULERA) 200-5 MCG/ACT AERO Inhale 2 puffs into the lungs 2 (two) times daily. 01/13/19   Charlott Rakes, MD  Multiple Vitamin (MULTIVITAMIN WITH MINERALS) TABS tablet Take 1 tablet by mouth daily.    [provider]  nicotine (NICODERM CQ - DOSED IN MG/24 HOURS) 21 mg/24hr patch Place 1 patch (21 mg total) onto the skin daily. Patient not taking: Reported on 08/25/2019 06/04/18   Charlott Rakes, MD  olopatadine (PATADAY) 0.1 % ophthalmic solution Place 1 drop into both eyes 2 (two) times daily. 12/22/19   Charlott Rakes, MD  olopatadine (PATANOL) 0.1 % ophthalmic solution Place 1 drop into both eyes 2 (two) times daily. 12/21/19   Charlott Rakes, MD  ondansetron (ZOFRAN ODT) 4 MG disintegrating tablet Take 1 tablet (4 mg total) by mouth every 8 (eight) hours as needed for nausea or vomiting. 05/13/20   Lucrezia Starch, MD  pantoprazole (PROTONIX) 40 MG tablet Take 1 tablet (40 mg total) by mouth 2 (two) times daily. 04/01/20   Charlott Rakes, MD  predniSONE (DELTASONE) 20 MG tablet Take 1 tablet (20 mg total) by mouth  daily with breakfast. Patient not taking: Reported on 10/17/2019 08/25/19   Charlott Rakes, MD  triamcinolone cream (KENALOG) 0.1 % Apply 1 application topically 2 (two) times daily. 12/17/19   Charlott Rakes, MD    Allergies    Patient has no known allergies.  Review of Systems   Review of Systems  Constitutional: Negative for chills and fever.  HENT: Negative for ear pain and sore throat.   Eyes: Negative for pain and visual disturbance.  Respiratory: Negative for cough and shortness of breath.   Cardiovascular: Negative for chest pain and palpitations.  Gastrointestinal: Positive for abdominal pain, diarrhea, nausea and vomiting.  Genitourinary: Negative for dysuria and hematuria.  Musculoskeletal: Negative for arthralgias and back pain.  Skin: Negative for color change  and rash.  Neurological: Negative for seizures and syncope.  All other systems reviewed and are negative.   Physical Exam Updated Vital Signs BP (!) 180/110   Pulse 66   Temp 98.9 F (37.2 C) (Oral)   Resp 14   Ht 5\' 4"  (1.626 m)   Wt 50.8 kg   SpO2 100%   BMI 19.22 kg/m   Physical Exam Vitals and nursing note reviewed.  Constitutional:      General: She is not in acute distress.    Appearance: She is well-developed.  HENT:     Head: Normocephalic and atraumatic.  Eyes:     Conjunctiva/sclera: Conjunctivae normal.  Cardiovascular:     Rate and Rhythm: Normal rate and regular rhythm.     Heart sounds: No murmur heard.   Pulmonary:     Effort: Pulmonary effort is normal. No respiratory distress.     Breath sounds: Normal breath sounds.  Abdominal:     Palpations: Abdomen is soft.     Tenderness: There is abdominal tenderness in the epigastric area. There is no guarding or rebound. Negative signs include Murphy's sign and McBurney's sign.  Musculoskeletal:     Cervical back: Neck supple.  Skin:    General: Skin is warm and dry.     Capillary Refill: Capillary refill takes less than 2 seconds.    Neurological:     Mental Status: She is alert.     ED Results / Procedures / Treatments   Labs (all labs ordered are listed, but only abnormal results are displayed) Labs Reviewed  LIPASE, BLOOD - Abnormal; Notable for the following components:      Result Value   Lipase 61 (*)    All other components within normal limits  COMPREHENSIVE METABOLIC PANEL - Abnormal; Notable for the following components:   Glucose, Bld 109 (*)    BUN <5 (*)    All other components within normal limits  CBC - Abnormal; Notable for the following components:   Hemoglobin 15.1 (*)    MCV 101.1 (*)    MCH 34.3 (*)    All other components within normal limits  URINALYSIS, ROUTINE W REFLEX MICROSCOPIC  I-STAT BETA HCG BLOOD, ED ([facility], [facility], [facility] ONLY)    EKG None  Radiology CT ABDOMEN PELVIS W CONTRAST  Result Date: 05/13/2020 CLINICAL DATA:  Abdominal pain with diarrhea, nausea, and vomiting reported history of pancreatitis EXAM: CT ABDOMEN AND PELVIS WITH CONTRAST TECHNIQUE: Multidetector CT imaging of the abdomen and pelvis was performed using the standard protocol following bolus administration of intravenous contrast. Oral contrast also administered CONTRAST:  171mL OMNIPAQUE IOHEXOL 300 MG/ML  SOLN COMPARISON:  March 22, 2019. FINDINGS: Lower chest: Lung bases are clear. Hepatobiliary: Decreased attenuation is noted in the liver consistent with hepatic steatosis. No focal liver lesions are appreciable. The gallbladder is somewhat contracted. There is no appreciable biliary duct dilatation. Pancreas: Calcifications are again noted in the region of the head of the pancreas. Similar appearing fluid is noted adjacent to the head of the pancreas compared to the previous study from August 2020, felt to be indicative pancreatitis with inflammation in the region of the head. There has been no appreciable progression of loculated fluid in this area. Calcification is noted along the posterior junction of body and tail  of pancreas, stable. Pancreatic duct is upper normal in size, stable,. There is no new peripancreatic fluid or soft tissue stranding. Spleen: No splenic lesions are evident. Adrenals/Urinary Tract: Adrenals bilaterally appear  normal. There is a 5 mm cyst in the lateral upper left kidney. No hydronephrosis evident on either side. There is no renal or ureteral calculus on either side. Urinary bladder is midline with wall thickness within normal limits. Stomach/Bowel: There is no appreciable bowel wall or mesenteric thickening. No evident bowel obstruction. The terminal ileum appears normal. No free air or portal venous air. Vascular/Lymphatic: No abdominal aortic aneurysm. There are foci of aortic and iliac artery atherosclerotic calcification. Major venous structures appear patent. No adenopathy evident in the abdomen or pelvis. Reproductive: Uterus anteverted.  No pelvic mass demonstrable. Other: No appendiceal region inflammation. No evident abscess or ascites in the abdomen or pelvis. Musculoskeletal: No blastic or lytic bone lesions. No evident intramuscular or abdominal wall lesions. IMPRESSION: 1. Evidence of essentially stable fluid at the level of the head of the pancreas with associated calcification. Appearance primarily consistent with chronic pancreatitis. A degree of acute component pancreatitis cannot be excluded in this area. Advise appropriate laboratory correlation in this regard. Calcification at the junction the body and tail the pancreas appear stable. No new fluid collections or peripancreatic inflammation. Borderline prominence of the pancreatic duct noted in the head and proximal body region which could represent sequela of pancreatitis. No pancreatic necrosis. 2. No evident bowel wall thickening or bowel obstruction. No abscess in the abdomen or pelvis. Appendix region appears unremarkable. 3.  Hepatic steatosis. 4. Aortic Atherosclerosis (ICD10-I70.0). Iliac artery atherosclerotic  calcification bilaterally also noted. 5. No renal or ureteral calculus. No hydronephrosis. Urinary bladder wall thickness normal. Electronically Signed   By: Lowella Grip III M.D.   On: 05/13/2020 10:38    Procedures Procedures (including critical care time)  Medications Ordered in ED Medications  morphine 4 MG/ML injection 4 mg (4 mg Intravenous Given 05/13/20 0916)  ondansetron (ZOFRAN) injection 4 mg (4 mg Intravenous Given 05/13/20 0916)  sodium chloride 0.9 % bolus 1,000 mL (0 mLs Intravenous Stopped 05/13/20 0958)  iohexol (OMNIPAQUE) 300 MG/ML solution 100 mL (100 mLs Intravenous Contrast Given 05/13/20 1006)    ED Course  I have reviewed the triage vital signs and the nursing notes.  Pertinent labs & imaging results that were available during my care of the patient were reviewed by me and considered in my medical decision making (see chart for details).    MDM Rules/Calculators/A&P                         44 year old lady presenting to ER with concern for epigastric pain, nausea and vomiting.  On exam she is well-appearing, did note some tenderness in her epigastric region, no right upper quadrant pain, negative Murphy's.  Labs were grossly stable, noted mild elevation in lipase.  CT scan demonstrating findings consistent with chronic pancreatitis.  Suspect her clinical presentation is due to this diagnosis.  Her symptoms are well controlled, remains well-appearing, tolerating p.o.  At this time I believe she is appropriate for discharge and outpatient management.  Provided Rx for Zofran and recommended close recheck with your primary doctor.    After the discussed management above, the patient was determined to be safe for discharge.  The patient was in agreement with this plan and all questions regarding their care were answered.  ED return precautions were discussed and the patient will return to the ED with any significant worsening of condition.    Final Clinical  Impression(s) / ED Diagnoses Final diagnoses:  Chronic pancreatitis, unspecified pancreatitis type ([place])  Rx / DC Orders ED Discharge Orders         Ordered    ondansetron (ZOFRAN ODT) 4 MG disintegrating tablet  Every 8 hours PRN        05/13/20 1117           Lucrezia Starch, MD 05/13/20 1143

## 2020-05-17 ENCOUNTER — Encounter: Payer: Self-pay | Admitting: Family Medicine

## 2020-05-22 ENCOUNTER — Encounter: Payer: Self-pay | Admitting: Family Medicine

## 2020-06-21 ENCOUNTER — Encounter: Payer: Self-pay | Admitting: Nurse Practitioner

## 2020-06-22 ENCOUNTER — Other Ambulatory Visit: Payer: Self-pay | Admitting: Family Medicine

## 2020-06-22 DIAGNOSIS — J438 Other emphysema: Secondary | ICD-10-CM

## 2020-06-22 NOTE — Telephone Encounter (Signed)
Requested medications are due for refill today yes  Requested medications are on the active medication list yes  Last refill 5/5  Last visit Do not see a visit that addresses theses meds/dx in the past year  Future visit scheduled No  Notes to clinic Failed protocol due to no valid visit within 12 months, no upcoming visit scheduled.

## 2020-07-20 ENCOUNTER — Ambulatory Visit: Payer: Medicaid Other | Admitting: Nurse Practitioner

## 2020-07-27 ENCOUNTER — Emergency Department (HOSPITAL_COMMUNITY)
Admission: EM | Admit: 2020-07-27 | Discharge: 2020-07-28 | Disposition: A | Discharge status: Home / Self Care | Payer: Medicaid Other | Attending: Emergency Medicine | Admitting: Emergency Medicine

## 2020-07-27 ENCOUNTER — Encounter (HOSPITAL_COMMUNITY): Payer: Self-pay

## 2020-07-27 DIAGNOSIS — S0990XA Unspecified injury of head, initial encounter: Secondary | ICD-10-CM | POA: Insufficient documentation

## 2020-07-27 DIAGNOSIS — Z5321 Procedure and treatment not carried out due to patient leaving prior to being seen by health care provider: Secondary | ICD-10-CM | POA: Diagnosis not present

## 2020-07-27 DIAGNOSIS — S199XXA Unspecified injury of neck, initial encounter: Secondary | ICD-10-CM | POA: Diagnosis not present

## 2020-07-27 DIAGNOSIS — Y92410 Unspecified street and highway as the place of occurrence of the external cause: Secondary | ICD-10-CM | POA: Diagnosis not present

## 2020-07-27 MED ORDER — ACETAMINOPHEN 325 MG PO TABS
650.0000 mg | ORAL_TABLET | Freq: Once | ORAL | Status: AC
  Administered 2020-07-27: 650 mg via ORAL
  Filled 2020-07-27: qty 2
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
## 2020-07-27 NOTE — ED Notes (Signed)
Pt up to desk saying that she is leaving. Left ED.

## 2020-07-28 ENCOUNTER — Other Ambulatory Visit: Payer: Self-pay

## 2020-07-28 ENCOUNTER — Ambulatory Visit (INDEPENDENT_AMBULATORY_CARE_PROVIDER_SITE_OTHER): Payer: Medicaid Other

## 2020-07-28 ENCOUNTER — Encounter (HOSPITAL_COMMUNITY): Payer: Self-pay

## 2020-07-28 ENCOUNTER — Ambulatory Visit (HOSPITAL_COMMUNITY)
Admission: EM | Admit: 2020-07-28 | Discharge: 2020-07-28 | Disposition: A | Discharge status: Home / Self Care | Payer: Medicaid Other | Attending: Internal Medicine | Admitting: Internal Medicine

## 2020-07-28 DIAGNOSIS — M542 Cervicalgia: Secondary | ICD-10-CM

## 2020-07-28 DIAGNOSIS — S161XXA Strain of muscle, fascia and tendon at neck level, initial encounter: Secondary | ICD-10-CM | POA: Diagnosis not present

## 2020-07-28 DIAGNOSIS — I779 Disorder of arteries and arterioles, unspecified: Secondary | ICD-10-CM

## 2020-07-28 MED ORDER — METHOCARBAMOL 500 MG PO TABS: 500.0000 mg | ORAL_TABLET | Freq: Three times a day (TID) | ORAL | 0 refills | Status: DC | PRN

## 2020-07-28 MED ORDER — NAPROXEN 500 MG PO TABS
500.0000 mg | ORAL_TABLET | Freq: Two times a day (BID) | ORAL | 0 refills | Status: DC | PRN
Start: 2020-07-28 — End: 2020-12-01

## 2020-07-28 MED ORDER — KETOROLAC TROMETHAMINE 60 MG/2ML IM SOLN
INTRAMUSCULAR | Status: AC
  Filled 2020-07-28: qty 2

## 2020-07-28 MED ORDER — KETOROLAC TROMETHAMINE 60 MG/2ML IM SOLN
60.0000 mg | Freq: Once | INTRAMUSCULAR | Status: AC
  Administered 2020-07-28: 60 mg via INTRAMUSCULAR

## 2020-07-28 NOTE — Discharge Instructions (Addendum)
Your x-ray did not show any fractures in your neck.  I will prescribe you an anti-inflammatory to begin taking tomorrow as you received an injection in the office today.  I will also prescribe you a muscle relaxer to take as needed.  Again, this medication will make you sleepy so no taking prior to driving.  Light stretching and heating pad will also be helpful.  We discussed findings of carotid vascular disease on your x-ray.  You need to call your PCP to have them arrange an ultrasound for further evaluation and possible referral to vascular surgery.
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
## 2020-07-29 ENCOUNTER — Telehealth: Payer: Self-pay | Admitting: Family Medicine

## 2020-07-29 NOTE — Telephone Encounter (Signed)
Please make pt an appointment for HFU with any provider

## 2020-07-29 NOTE — Telephone Encounter (Signed)
Pt is asking if she needs to be seen next week based on the Xray results from 07/28/20 so an appt can be made then or later. Please advise and thank you. Pt also needing a Hosp f/u. Appt.

## 2020-08-01 ENCOUNTER — Encounter: Payer: Self-pay | Admitting: Family Medicine

## 2020-08-15 ENCOUNTER — Ambulatory Visit (HOSPITAL_COMMUNITY)
Admission: EM | Admit: 2020-08-15 | Discharge: 2020-08-15 | Disposition: A | Discharge status: Home / Self Care | Payer: Medicaid Other | Attending: Emergency Medicine | Admitting: Emergency Medicine

## 2020-08-15 ENCOUNTER — Ambulatory Visit (INDEPENDENT_AMBULATORY_CARE_PROVIDER_SITE_OTHER): Payer: Medicaid Other

## 2020-08-15 ENCOUNTER — Encounter (HOSPITAL_COMMUNITY): Payer: Self-pay | Admitting: Emergency Medicine

## 2020-08-15 DIAGNOSIS — R059 Cough, unspecified: Secondary | ICD-10-CM | POA: Diagnosis not present

## 2020-08-15 DIAGNOSIS — J069 Acute upper respiratory infection, unspecified: Secondary | ICD-10-CM | POA: Diagnosis present

## 2020-08-15 DIAGNOSIS — U071 COVID-19: Secondary | ICD-10-CM | POA: Insufficient documentation

## 2020-08-15 DIAGNOSIS — R0602 Shortness of breath: Secondary | ICD-10-CM

## 2020-08-15 HISTORY — DX: Unspecified disorder of circulatory system: I99.9

## 2020-08-15 LAB — SARS CORONAVIRUS 2 (TAT 6-24 HRS): SARS Coronavirus 2: POSITIVE — AB

## 2020-08-15 MED ORDER — BENZONATATE 100 MG PO CAPS: 200.0000 mg | ORAL_CAPSULE | Freq: Three times a day (TID) | ORAL | 0 refills | Status: DC | PRN

## 2020-08-15 MED ORDER — PROMETHAZINE-DM 6.25-15 MG/5ML PO SYRP: 5.0000 mL | ORAL_SOLUTION | Freq: Four times a day (QID) | ORAL | 0 refills | Status: DC | PRN

## 2020-08-15 NOTE — ED Triage Notes (Signed)
Pt states that she has a cough, sore throat, diarrhea, body aches, HA, and chills started yesterday.

## 2020-08-15 NOTE — ED Provider Notes (Signed)
[place]    CSN: [id number] Arrival date & time: 08/15/20  [id number]      History   Chief Complaint Chief Complaint  Patient presents with  . Sore Throat  . Cough  . Headache  . Chills  . Diarrhea    HPI Felicia Perkins is a 45 y.o. female.   HPI   45 year old female with history of COPD here for evaluation of sore throat, cough, body aches, headache, diarrhea, and chills.  Patient reports that her symptoms started yesterday.  Patient reports she is also had nasal congestion a mild runny nose.  She also says some shortness of breath and wheezing and has used albuterol breathing treatment yesterday to help with her symptoms.  Patient also reports nausea, vomiting, and diarrhea.  Patient denies any fever or ear pain or pressure.  Patient has had Covid exposure.  Patient is not vaccinated gets Covid nor has she had her flu shot.  Past Medical History:  Diagnosis Date  . Alcohol abuse   . Anxiety   . Bipolar 1 disorder ([place])   . Cancer (HCC)    cervical cancer  . Cirrhosis ([place])   . GERD (gastroesophageal reflux disease)   . Pancreatitis   . PTSD (post-traumatic stress disorder)   . Vascular disease     Patient Active Problem List   Diagnosis Date Noted  . Acute on chronic pancreatitis ([place]) 03/22/2019  . Bipolar 1 disorder ([place]) 01/13/2019  . Eczema 10/09/2018  . Acute pancreatitis   . Alcohol induced acute pancreatitis 11/22/2017  . Duodenitis 11/22/2017  . Tobacco abuse counseling   . 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 Nondisplaced fracture of neck of fifth metacarpal bone, right hand, initial encounter for closed fracture 08/26/2017  . Alcoholic cirrhosis of liver without ascites ([place]) 10/15/2016  . Alcohol abuse 09/04/2016  . Gastritis and gastroduodenitis 09/04/2016  . Tobacco abuse 09/04/2016  . COPD (chronic obstructive pulmonary disease) ([place]) 09/04/2016  . Elevated liver enzymes 12/22/2014  . Abdominal pain, epigastric 12/16/2014  . Chest  pain with moderate risk for cardiac etiology 12/15/2014  . Alcohol dependence ([place]) 12/16/2012    Class: Acute    Past Surgical History:  Procedure Laterality Date  . ABDOMINAL HYSTERECTOMY  2004  . BIOPSY  01/02/2018   Procedure: BIOPSY;  Surgeon: Milus Banister, MD;  Location: [facility];  Service: Endoscopy;;  . ESOPHAGOGASTRODUODENOSCOPY (EGD) WITH PROPOFOL N/A 01/02/2018   Procedure: ESOPHAGOGASTRODUODENOSCOPY (EGD) WITH PROPOFOL;  Surgeon: Milus Banister, MD;  Location: [facility];  Service: Endoscopy;  Laterality: N/A;  . EUS N/A 01/02/2018   Procedure: UPPER ENDOSCOPIC ULTRASOUND (EUS) RADIAL;  Surgeon: Milus Banister, MD;  Location: [facility];  Service: Endoscopy;  Laterality: N/A;  . FOOT SURGERY    . OTHER SURGICAL HISTORY     ovarian surgery  . TUBAL LIGATION      OB History   No obstetric history on file.      Home Medications    Prior to Admission medications   Medication Sig Start Date End Date Taking? Authorizing Provider  albuterol (VENTOLIN HFA) 108 (90 Base) MCG/ACT inhaler Inhale 1-2 puffs into the lungs every 6 (six) hours as needed for wheezing or shortness of breath. 04/01/20  Yes Newlin, Enobong, MD  benzonatate (TESSALON) 100 MG capsule Take 2 capsules (200 mg total) by mouth 3 (three) times daily as needed for cough. 08/15/20  Yes Margarette Canada, NP  promethazine-dextromethorphan (PROMETHAZINE-DM) 6.25-15 MG/5ML syrup Take 5 mLs by mouth  4 (four) times daily as needed for cough. 08/15/20  Yes Margarette Canada, NP  cetirizine (ZYRTEC) 10 MG tablet TAKE 1 TABLET BY MOUTH EVERY DAY 06/23/20   Charlott Rakes, MD  DULERA 200-5 MCG/ACT AERO TAKE 2 PUFFS BY MOUTH TWICE A DAY 06/23/20   Charlott Rakes, MD  famotidine (PEPCID) 20 MG tablet Take 1 tablet (20 mg total) by mouth 2 (two) times daily. 05/10/20   Charlott Rakes, MD  gabapentin (NEURONTIN) 300 MG capsule Take 1 capsule (300 mg total) by mouth at bedtime. Patient not taking: Reported on 10/17/2019 08/25/19    Charlott Rakes, MD  ibuprofen (ADVIL) 400 MG tablet Take 0.5 tablets (200 mg total) by mouth every 6 (six) hours as needed. Patient not taking: Reported on 10/17/2019 09/05/19 09/04/20  Nuala Alpha, DO  methocarbamol (ROBAXIN) 500 MG tablet Take 1 tablet (500 mg total) by mouth every 8 (eight) hours as needed for muscle spasms. 07/28/20   Rudolpho Sevin, NP  Multiple Vitamin (MULTIVITAMIN WITH MINERALS) TABS tablet Take 1 tablet by mouth daily.    [provider]  naproxen (NAPROSYN) 500 MG tablet Take 1 tablet (500 mg total) by mouth 2 (two) times daily as needed. 07/28/20   Rudolpho Sevin, NP  nicotine (NICODERM CQ - DOSED IN MG/24 HOURS) 21 mg/24hr patch Place 1 patch (21 mg total) onto the skin daily. Patient not taking: Reported on 08/25/2019 06/04/18   Charlott Rakes, MD  olopatadine (PATADAY) 0.1 % ophthalmic solution Place 1 drop into both eyes 2 (two) times daily. 12/22/19   Charlott Rakes, MD  olopatadine (PATANOL) 0.1 % ophthalmic solution Place 1 drop into both eyes 2 (two) times daily. 12/21/19   Charlott Rakes, MD  ondansetron (ZOFRAN ODT) 4 MG disintegrating tablet Take 1 tablet (4 mg total) by mouth every 8 (eight) hours as needed for nausea or vomiting. 05/13/20   Lucrezia Starch, MD  pantoprazole (PROTONIX) 40 MG tablet Take 1 tablet (40 mg total) by mouth 2 (two) times daily. 04/01/20   Charlott Rakes, MD  predniSONE (DELTASONE) 20 MG tablet Take 1 tablet (20 mg total) by mouth daily with breakfast. Patient not taking: Reported on 10/17/2019 08/25/19   Charlott Rakes, MD  triamcinolone cream (KENALOG) 0.1 % Apply 1 application topically 2 (two) times daily. 12/17/19   Charlott Rakes, MD  cloNIDine (CATAPRES) 0.1 MG tablet Take 1 tablet (0.1 mg total) by mouth at bedtime. For hot flashes Patient not taking: Reported on 10/17/2019 01/13/19 08/15/20  Charlott Rakes, MD    Family History Family History  Problem Relation Age of Onset  . Hypertension Mother   . Breast  cancer Mother 41  . Pancreatitis Mother   . Hypertension Father   . Heart attack Cousin 30       died in 55s of MI  . Breast cancer Maternal Aunt   . Breast cancer Maternal Aunt   . Colon cancer Neg Hx   . Esophageal cancer Neg Hx   . Rectal cancer Neg Hx   . Stomach cancer Neg Hx     Social History Social History   Tobacco Use  . Smoking status: Current Every Day Smoker    Packs/day: 0.50    Types: Cigarettes  . Smokeless tobacco: Never Used  . Tobacco comment: 2-3 cigarettes daily  Vaping Use  . Vaping Use: Never used  Substance Use Topics  . Alcohol use: Not Currently    Comment: no alchol for the past 3 weeks  . Drug  use: No     Allergies   Patient has no known allergies.   Review of Systems Review of Systems  Constitutional: Positive for chills and fatigue. Negative for activity change, appetite change and fever.  HENT: Positive for congestion, rhinorrhea and sore throat. Negative for ear pain.   Respiratory: Positive for cough, shortness of breath and wheezing.   Gastrointestinal: Positive for diarrhea, nausea and vomiting. Negative for abdominal pain.  Musculoskeletal: Positive for arthralgias and myalgias.  Skin: Negative for rash.  Neurological: Positive for headaches.  Hematological: Negative.   Psychiatric/Behavioral: Negative.      Physical Exam Triage Vital Signs ED Triage Vitals  Enc Vitals Group     BP 08/15/20 1017 (!) 137/99     Pulse Rate 08/15/20 1017 86     Resp 08/15/20 1017 16     Temp 08/15/20 1017 98.7 F (37.1 C)     Temp Source 08/15/20 1017 Oral     SpO2 08/15/20 1017 99 %     Weight --      Height --      Head Circumference --      Peak Flow --      Pain Score 08/15/20 1015 10     Pain Loc --      Pain Edu? --      Excl. in GC? --    No data found.  Updated Vital Signs BP (!) 137/99 (BP Location: Right Arm)   Pulse 86   Temp 98.7 F (37.1 C) (Oral)   Resp 16   SpO2 99%   Visual Acuity Right Eye Distance:    Left Eye Distance:   Bilateral Distance:    Right Eye Near:   Left Eye Near:    Bilateral Near:     Physical Exam Vitals and nursing note reviewed.  Constitutional:      General: She is not in acute distress.    Appearance: She is well-developed. She is ill-appearing.  HENT:     Head: Normocephalic and atraumatic.     Right Ear: Tympanic membrane and ear canal normal. Tympanic membrane is not erythematous.     Left Ear: Tympanic membrane and ear canal normal. Tympanic membrane is not erythematous.     Nose: No congestion or rhinorrhea.     Mouth/Throat:     Mouth: Mucous membranes are moist.     Pharynx: Oropharynx is clear. No oropharyngeal exudate or posterior oropharyngeal erythema.     Tonsils: No tonsillar exudate. 0 on the right. 0 on the left.  Cardiovascular:     Rate and Rhythm: Normal rate and regular rhythm.     Heart sounds: Normal heart sounds. No murmur heard. No gallop.   Pulmonary:     Effort: Respiratory distress present.     Breath sounds: No wheezing, rhonchi or rales.  Musculoskeletal:     Cervical back: Normal range of motion and neck supple.  Lymphadenopathy:     Cervical: Cervical adenopathy present.  Skin:    General: Skin is warm and dry.     Capillary Refill: Capillary refill takes less than 2 seconds.     Findings: No erythema or rash.  Neurological:     General: No focal deficit present.     Mental Status: She is alert and oriented to person, place, and time.  Psychiatric:        Mood and Affect: Mood normal.        Behavior: Behavior normal.  UC Treatments / Results  Labs (all labs ordered are listed, but only abnormal results are displayed) Labs Reviewed  SARS CORONAVIRUS 2 (TAT 6-24 HRS)    EKG   Radiology DG Chest 2 View  Result Date: 08/15/2020 CLINICAL DATA:  Shortness of breath and cough EXAM: CHEST - 2 VIEW COMPARISON:  February 28, 2020 FINDINGS: Lungs are clear. Heart size and pulmonary vascularity are normal. No  adenopathy. No bone lesions. IMPRESSION: Lungs clear.  Cardiac silhouette normal. Electronically Signed   By: Lowella Grip III M.D.   On: 08/15/2020 11:36    Procedures Procedures (including critical care time)  Medications Ordered in UC Medications - No data to display  Initial Impression / Assessment and Plan / UC Course  I have reviewed the triage vital signs and the nursing notes.  Pertinent labs & imaging results that were available during my care of the patient were reviewed by me and considered in my medical decision making (see chart for details).   Patient is an ill appearing 45 year old female with history of COPD who is here for evaluation of Covid-like symptoms.  Patient symptoms began yesterday.  Physical exam reveals a patient with mildly increased work of breathing and decreased lung sounds in all fields.  No wheezes, rhonchi, or rales appreciated upon auscultation.  Will obtain chest x-ray to rule out intrathoracic process.  Nasal mucosa is pink and moist without discharge as his oropharyngeal mucosa.  Bilateral TMs are pearly gray with normal light reflex and external auditory canals are clear bilaterally.  Patient is unvaccinated his Covid has had a Covid exposure.  Patient also works in a Music therapist at [employer].  Suspect patient has Covid and will send Covid swab.  Chest x-ray is negative for any acute intrathoracic process.  We will discharge patient home to isolate pending the results of her Covid swab.  Will have patient continue to use her albuterol inhaler as needed for shortness of breath.  Will give Promethazine DM and Tessalon Perles for cough and ER precautions.   Final Clinical Impressions(s) / UC Diagnoses   Final diagnoses:  Upper respiratory tract infection, unspecified type     Discharge Instructions     This isYou need to isolate at home until the results of your Covid test are back.  Positive then you will need to quarantine for 10 days  from when your symptoms started.  After the 10-day.  You can break quarantine if your symptoms have improved and you have not had a fever in 24 hours without taking Tylenol or ibuprofen.  Continue to use your albuterol inhaler and nebulizer as needed for shortness of breath or wheezing.  Also continue her Dulera inhaler.  Use the Tessalon Perles every 8 hours during the day as needed for cough.  Take them with a small sip of water.  They may give you a numbness to the base of your tongue or a metallic taste in your mouth, this is normal.  Use the Promethazine DM cough syrup at nighttime as needed for cough, congestion, and sleep as it will make you drowsy.  If you develop worsening shortness of breath, especially shortness of breath at rest, you cannot speak in full sentences, or you develop bluing around your lips you need to go to the emergency department for evaluation.    ED Prescriptions    Medication Sig Dispense Auth. Provider   benzonatate (TESSALON) 100 MG capsule Take 2 capsules (200 mg total) by mouth 3 (three) times  daily as needed for cough. 21 capsule Margarette Canada, NP   promethazine-dextromethorphan (PROMETHAZINE-DM) 6.25-15 MG/5ML syrup Take 5 mLs by mouth 4 (four) times daily as needed for cough. 118 mL Margarette Canada, NP     PDMP not reviewed this encounter.   Margarette Canada, NP 08/15/20 1159

## 2020-08-15 NOTE — Discharge Instructions (Addendum)
This isYou need to isolate at home until the results of your Covid test are back.  Positive then you will need to quarantine for 10 days from when your symptoms started.  After the 10-day.  You can break quarantine if your symptoms have improved and you have not had a fever in 24 hours without taking Tylenol or ibuprofen.  Continue to use your albuterol inhaler and nebulizer as needed for shortness of breath or wheezing.  Also continue her Dulera inhaler.  Use the Tessalon Perles every 8 hours during the day as needed for cough.  Take them with a small sip of water.  They may give you a numbness to the base of your tongue or a metallic taste in your mouth, this is normal.  Use the Promethazine DM cough syrup at nighttime as needed for cough, congestion, and sleep as it will make you drowsy.  If you develop worsening shortness of breath, especially shortness of breath at rest, you cannot speak in full sentences, or you develop bluing around your lips you need to go to the emergency department for evaluation.

## 2020-08-24 ENCOUNTER — Other Ambulatory Visit: Payer: Self-pay

## 2020-08-24 ENCOUNTER — Ambulatory Visit: Payer: Medicaid Other | Attending: Physician Assistant | Admitting: Physician Assistant

## 2020-08-24 DIAGNOSIS — U071 COVID-19: Secondary | ICD-10-CM

## 2020-08-24 DIAGNOSIS — K2970 Gastritis, unspecified, without bleeding: Secondary | ICD-10-CM | POA: Diagnosis not present

## 2020-08-24 DIAGNOSIS — R0989 Other specified symptoms and signs involving the circulatory and respiratory systems: Secondary | ICD-10-CM | POA: Diagnosis not present

## 2020-08-24 DIAGNOSIS — K2990 Gastroduodenitis, unspecified, without bleeding: Secondary | ICD-10-CM

## 2020-08-24 DIAGNOSIS — Z09 Encounter for follow-up examination after completed treatment for conditions other than malignant neoplasm: Secondary | ICD-10-CM

## 2020-08-24 MED ORDER — FAMOTIDINE 20 MG PO TABS: 20.0000 mg | ORAL_TABLET | Freq: Two times a day (BID) | ORAL | 2 refills | Status: DC

## 2020-08-24 NOTE — Progress Notes (Signed)
Patient ID: Felicia Perkins, female   DOB: 03-09-1976, 45 y.o.   MRN: [id number] Virtual Visit via Telephone Note  I connected with [name] on 08/24/20 at  3:10 PM EST by telephone and verified that I am speaking with the correct person using two identifiers.  Location: Patient: Felicia Perkins Provider: Freeman Caldron, PA-C   I discussed the limitations, risks, security and privacy concerns of performing an evaluation and management service by telephone and the availability of in person appointments. I also discussed with the patient that there may be a patient responsible charge related to this service. The patient expressed understanding and agreed to proceed.   History of Present Illness: After being seen in the ED 08/15/2020 and tested positive for Covid.  She is doing well and back at work.  Taste and smell back to normal.  No GI s/x.   Also of note she was seen in the ED agter an accident on 07/28/2021 and as an incidental finding on xrays that she has carotid vascular disease and it was recommended that she follow up with vascular surgery.  She is asymptomatic.  No dizziness/lightheadedness.    ED note 08/15/2020: HPI   45 year old female with history of COPD here for evaluation of sore throat, cough, body aches, headache, diarrhea, and chills.  Patient reports that her symptoms started yesterday.  Patient reports she is also had nasal congestion a mild runny nose.  She also says some shortness of breath and wheezing and has used albuterol breathing treatment yesterday to help with her symptoms.  Patient also reports nausea, vomiting, and diarrhea.  Patient denies any fever or ear pain or pressure.  Patient has had Covid exposure.  Patient is not vaccinated gets Covid nor has she had her flu shot.  From A/P:  Patient is an ill appearing 45 year old female with history of COPD who is here for evaluation of Covid-like symptoms.  Patient symptoms began yesterday.  Physical exam  reveals a patient with mildly increased work of breathing and decreased lung sounds in all fields.  No wheezes, rhonchi, or rales appreciated upon auscultation.  Will obtain chest x-ray to rule out intrathoracic process.  Nasal mucosa is pink and moist without discharge as his oropharyngeal mucosa.  Bilateral TMs are pearly gray with normal light reflex and external auditory canals are clear bilaterally.  Patient is unvaccinated his Covid has had a Covid exposure.  Patient also works in a Music therapist at [employer].  Suspect patient has Covid and will send Covid swab.  Chest x-ray is negative for any acute intrathoracic process.  We will discharge patient home to isolate pending the results of her Covid swab.  Will have patient continue to use her albuterol inhaler as needed for shortness of breath.  Will give Promethazine DM and Tessalon Perles for cough and ER precautions.   Observations/Objective:  NAD.  A&Ox3   Assessment and Plan: 1. Carotid bruit, unspecified laterality If becomes symptomatic, to ED;  Patient verbalizes understanding - Ambulatory referral to Vascular Surgery  2. Gastritis and gastroduodenitis chronic - famotidine (PEPCID) 20 MG tablet; Take 1 tablet (20 mg total) by mouth 2 (two) times daily.  Dispense: 60 tablet; Refill: 2    Follow Up Instructions: Assign PCP in 2-3 months.     I discussed the assessment and treatment plan with the patient. The patient was provided an opportunity to ask questions and all were answered. The patient agreed with the plan and demonstrated an understanding of the  instructions.   The patient was advised to call back or seek an in-person evaluation if the symptoms worsen or if the condition fails to improve as anticipated.  I provided 16 minutes of non-face-to-face time during this encounter.   Freeman Caldron, PA-C

## 2020-08-28 ENCOUNTER — Other Ambulatory Visit: Payer: Self-pay | Admitting: Family Medicine

## 2020-08-29 MED ORDER — PANTOPRAZOLE SODIUM 40 MG PO TBEC: 40.0000 mg | DELAYED_RELEASE_TABLET | Freq: Two times a day (BID) | ORAL | 0 refills | Status: DC

## 2020-09-22 ENCOUNTER — Other Ambulatory Visit: Payer: Self-pay

## 2020-09-22 DIAGNOSIS — I6523 Occlusion and stenosis of bilateral carotid arteries: Secondary | ICD-10-CM

## 2020-10-07 ENCOUNTER — Encounter: Payer: Medicaid Other | Admitting: Vascular Surgery

## 2020-10-07 ENCOUNTER — Encounter (HOSPITAL_COMMUNITY): Payer: Medicaid Other

## 2020-10-09 ENCOUNTER — Other Ambulatory Visit: Payer: Self-pay

## 2020-10-09 ENCOUNTER — Other Ambulatory Visit: Payer: Self-pay | Admitting: Family Medicine

## 2020-10-10 ENCOUNTER — Encounter (HOSPITAL_COMMUNITY): Payer: Self-pay | Admitting: Emergency Medicine

## 2020-10-10 ENCOUNTER — Ambulatory Visit (HOSPITAL_COMMUNITY)
Admission: EM | Admit: 2020-10-10 | Discharge: 2020-10-10 | Disposition: A | Discharge status: Home / Self Care | Payer: Medicaid Other

## 2020-10-10 ENCOUNTER — Ambulatory Visit (HOSPITAL_COMMUNITY)
Admission: EM | Admit: 2020-10-10 | Discharge: 2020-10-10 | Disposition: A | Discharge status: Home / Self Care | Payer: Medicaid Other | Attending: Emergency Medicine | Admitting: Emergency Medicine

## 2020-10-10 ENCOUNTER — Other Ambulatory Visit: Payer: Self-pay

## 2020-10-10 DIAGNOSIS — M654 Radial styloid tenosynovitis [de Quervain]: Secondary | ICD-10-CM | POA: Diagnosis not present

## 2020-10-10 MED ORDER — PREDNISONE 20 MG PO TABS: ORAL_TABLET | ORAL | 0 refills | Status: DC

## 2020-10-10 NOTE — ED Notes (Signed)
Pt called in lobby, no response 

## 2020-10-10 NOTE — ED Provider Notes (Signed)
[facility]    CSN: [id number] Arrival date & time: 10/10/20  2004      History   Chief Complaint Chief Complaint  Patient presents with  . Wrist Pain    HPI Felicia Perkins is a 45 y.o. female.   Felicia Perkins presents with complaints of left wrist pain which is at base of left thumb. 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 Holding the large bowl as well as certain trays triggers the pain. She has been wearing a wrist brace which hasn't helped. Denies any previous similar. Has worsened over the past few days. No other known injury. Has been applying lidocaine patches which haven't helped.    ROS per HPI, negative if not otherwise mentioned.      Past Medical History:  Diagnosis Date  . Alcohol abuse   . Anxiety   . Bipolar 1 disorder ([place])   . Cancer (HCC)    cervical cancer  . Cirrhosis ([place])   . GERD (gastroesophageal reflux disease)   . Pancreatitis   . PTSD (post-traumatic stress disorder)   . Vascular disease     Patient Active Problem List   Diagnosis Date Noted  . Acute on chronic pancreatitis ([place]) 03/22/2019  . Bipolar 1 disorder ([place]) 01/13/2019  . Eczema 10/09/2018  . Acute pancreatitis   . Alcohol induced acute pancreatitis 11/22/2017  . Duodenitis 11/22/2017  . Tobacco abuse counseling   . 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 Nondisplaced fracture of neck of fifth metacarpal bone, right hand, initial encounter for closed fracture 08/26/2017  . Alcoholic cirrhosis of liver without ascites ([place]) 10/15/2016  . Alcohol abuse 09/04/2016  . Gastritis and gastroduodenitis 09/04/2016  . Tobacco abuse 09/04/2016  . COPD (chronic obstructive pulmonary disease) ([place]) 09/04/2016  . Elevated liver enzymes 12/22/2014  . Abdominal pain, epigastric 12/16/2014  . Chest pain with moderate risk for cardiac etiology 12/15/2014  . Alcohol dependence ([place]) 12/16/2012    Class: Acute    Past Surgical History:   Procedure Laterality Date  . ABDOMINAL HYSTERECTOMY  2004  . BIOPSY  01/02/2018   Procedure: BIOPSY;  Surgeon: Milus Banister, MD;  Location: [facility];  Service: Endoscopy;;  . ESOPHAGOGASTRODUODENOSCOPY (EGD) WITH PROPOFOL N/A 01/02/2018   Procedure: ESOPHAGOGASTRODUODENOSCOPY (EGD) WITH PROPOFOL;  Surgeon: Milus Banister, MD;  Location: [facility];  Service: Endoscopy;  Laterality: N/A;  . EUS N/A 01/02/2018   Procedure: UPPER ENDOSCOPIC ULTRASOUND (EUS) RADIAL;  Surgeon: Milus Banister, MD;  Location: [facility];  Service: Endoscopy;  Laterality: N/A;  . FOOT SURGERY    . OTHER SURGICAL HISTORY     ovarian surgery  . TUBAL LIGATION      OB History   No obstetric history on file.      Home Medications    Prior to Admission medications   Medication Sig Start Date End Date Taking? Authorizing Provider  predniSONE (DELTASONE) 20 MG tablet 3-3-3-2-2-2-1-1-1 10/10/20  Yes Aronda Burford, Malachy Moan, NP  albuterol (VENTOLIN HFA) 108 (90 Base) MCG/ACT inhaler Inhale 1-2 puffs into the lungs every 6 (six) hours as needed for wheezing or shortness of breath. 04/01/20   Charlott Rakes, MD  benzonatate (TESSALON) 100 MG capsule Take 2 capsules (200 mg total) by mouth 3 (three) times daily as needed for cough. 08/15/20   Margarette Canada, NP  cetirizine (ZYRTEC) 10 MG tablet TAKE 1 TABLET BY MOUTH EVERY DAY 06/23/20   Charlott Rakes, MD  famotidine (PEPCID)  20 MG tablet Take 1 tablet (20 mg total) by mouth 2 (two) times daily. 08/24/20   Argentina Donovan, PA-C  gabapentin (NEURONTIN) 300 MG capsule Take 1 capsule (300 mg total) by mouth at bedtime. Patient not taking: No sig reported 08/25/19   Charlott Rakes, MD  methocarbamol (ROBAXIN) 500 MG tablet Take 1 tablet (500 mg total) by mouth every 8 (eight) hours as needed for muscle spasms. 07/28/20   Rudolpho Sevin, NP  Multiple Vitamin (MULTIVITAMIN WITH MINERALS) TABS tablet Take 1 tablet by mouth daily.    [provider]  naproxen  (NAPROSYN) 500 MG tablet Take 1 tablet (500 mg total) by mouth 2 (two) times daily as needed. 07/28/20   Rudolpho Sevin, NP  nicotine (NICODERM CQ - DOSED IN MG/24 HOURS) 21 mg/24hr patch Place 1 patch (21 mg total) onto the skin daily. Patient not taking: No sig reported 06/04/18   Charlott Rakes, MD  olopatadine (PATADAY) 0.1 % ophthalmic solution Place 1 drop into both eyes 2 (two) times daily. 12/22/19   Charlott Rakes, MD  olopatadine (PATANOL) 0.1 % ophthalmic solution Place 1 drop into both eyes 2 (two) times daily. 12/21/19   Charlott Rakes, MD  ondansetron (ZOFRAN ODT) 4 MG disintegrating tablet Take 1 tablet (4 mg total) by mouth every 8 (eight) hours as needed for nausea or vomiting. 05/13/20   Lucrezia Starch, MD  pantoprazole (PROTONIX) 40 MG tablet Take 1 tablet (40 mg total) by mouth 2 (two) times daily. 08/29/20   Charlott Rakes, MD  triamcinolone cream (KENALOG) 0.1 % Apply 1 application topically 2 (two) times daily. 12/17/19   Charlott Rakes, MD  cloNIDine (CATAPRES) 0.1 MG tablet Take 1 tablet (0.1 mg total) by mouth at bedtime. For hot flashes Patient not taking: Reported on 10/17/2019 01/13/19 08/15/20  Charlott Rakes, MD    Family History Family History  Problem Relation Age of Onset  . Hypertension Mother   . Breast cancer Mother 58  . Pancreatitis Mother   . Hypertension Father   . Heart attack Cousin 30       died in 71s of MI  . Breast cancer Maternal Aunt   . Breast cancer Maternal Aunt   . Colon cancer Neg Hx   . Esophageal cancer Neg Hx   . Rectal cancer Neg Hx   . Stomach cancer Neg Hx     Social History Social History   Tobacco Use  . Smoking status: Current Every Day Smoker    Packs/day: 0.50    Types: Cigarettes  . Smokeless tobacco: Never Used  . Tobacco comment: 2-3 cigarettes daily  Vaping Use  . Vaping Use: Never used  Substance Use Topics  . Alcohol use: Not Currently    Comment: no alchol for the past 3 weeks  . Drug use: No      Allergies   Patient has no known allergies.   Review of Systems Review of Systems   Physical Exam Triage Vital Signs ED Triage Vitals  Enc Vitals Group     BP 10/10/20 2012 102/71     Pulse Rate 10/10/20 2012 76     Resp 10/10/20 2012 17     Temp 10/10/20 2012 97.6 F (36.4 C)     Temp Source 10/10/20 2012 Oral     SpO2 10/10/20 2012 99 %     Weight --      Height --      Head Circumference --  Peak Flow --      Pain Score 10/10/20 2029 6     Pain Loc --      Pain Edu? --      Excl. in [place]? --    No data found.  Updated Vital Signs BP 102/71 (BP Location: Right Arm)   Pulse 76   Temp 97.6 F (36.4 C) (Oral)   Resp 17   SpO2 99%   Visual Acuity Right Eye Distance:   Left Eye Distance:   Bilateral Distance:    Right Eye Near:   Left Eye Near:    Bilateral Near:     Physical Exam Constitutional:      General: She is not in acute distress.    Appearance: She is well-developed.  Cardiovascular:     Rate and Rhythm: Normal rate.  Pulmonary:     Effort: Pulmonary effort is normal.  Musculoskeletal:     Right wrist: No swelling, deformity or lacerations.     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  Neurological:     Mental Status: She is alert and oriented to person, place, and time.      UC Treatments / Results  Labs (all labs ordered are listed, but only abnormal results are displayed) Labs Reviewed - No data to display  EKG   Radiology No results found.  Procedures Procedures (including critical care time)  Medications Ordered in UC Medications - No data to display  Initial Impression / Assessment and Plan / UC Course  I have reviewed the triage vital signs and the nursing notes.  Pertinent labs & imaging results that were available during my care of the  patient were reviewed by me and considered in my medical decision making (see chart for details).     Consistent with de quervain. Thumb spica placed and prednisone pack provided. Sports medicine follow up recommended prn. Patient verbalized understanding and agreeable to plan.   Final Clinical Impressions(s) / UC Diagnoses   Final diagnoses:  De Quervain's tenosynovitis, left     Discharge Instructions     Apply ice after use.  Wear brace with any use.  Complete course of steroids.  Follow up with sports medicine if persistent.    ED Prescriptions    Medication Sig Dispense Auth. Provider   predniSONE (DELTASONE) 20 MG tablet 3-3-3-2-2-2-1-1-1 18 tablet Augusto Gamble B, NP     PDMP not reviewed this encounter.   Zigmund Gottron, NP 10/10/20 2036

## 2020-10-10 NOTE — ED Triage Notes (Signed)
Pt states that she noticed that her left wrist was hurting last Monday. 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 Pt states that she currently has a brace but it does not help any

## 2020-10-10 NOTE — Discharge Instructions (Addendum)
Apply ice after use.  Wear brace with any use.  Complete course of steroids.  Follow up with sports medicine if persistent.

## 2020-10-11 MED ORDER — PANTOPRAZOLE SODIUM 40 MG PO TBEC: 40.0000 mg | DELAYED_RELEASE_TABLET | Freq: Two times a day (BID) | ORAL | 0 refills | Status: DC

## 2020-10-11 NOTE — Telephone Encounter (Signed)
Last pharmacy note stated that pt must have OV for refills, unsure if appropriate to RF, if so, pls send to pharmacy, thanks

## 2020-10-24 ENCOUNTER — Other Ambulatory Visit: Payer: Self-pay

## 2020-10-24 ENCOUNTER — Emergency Department (HOSPITAL_COMMUNITY)
Admission: EM | Admit: 2020-10-24 | Discharge: 2020-10-24 | Disposition: A | Discharge status: Home / Self Care | Payer: Medicaid Other | Attending: Emergency Medicine | Admitting: Emergency Medicine

## 2020-10-24 ENCOUNTER — Emergency Department (HOSPITAL_COMMUNITY): Payer: Medicaid Other

## 2020-10-24 ENCOUNTER — Encounter (HOSPITAL_COMMUNITY): Payer: Self-pay | Admitting: Emergency Medicine

## 2020-10-24 DIAGNOSIS — D72829 Elevated white blood cell count, unspecified: Secondary | ICD-10-CM | POA: Insufficient documentation

## 2020-10-24 DIAGNOSIS — F17210 Nicotine dependence, cigarettes, uncomplicated: Secondary | ICD-10-CM | POA: Insufficient documentation

## 2020-10-24 DIAGNOSIS — Z8719 Personal history of other diseases of the digestive system: Secondary | ICD-10-CM | POA: Diagnosis not present

## 2020-10-24 DIAGNOSIS — K861 Other chronic pancreatitis: Secondary | ICD-10-CM | POA: Diagnosis not present

## 2020-10-24 DIAGNOSIS — R779 Abnormality of plasma protein, unspecified: Secondary | ICD-10-CM | POA: Insufficient documentation

## 2020-10-24 DIAGNOSIS — R112 Nausea with vomiting, unspecified: Secondary | ICD-10-CM | POA: Diagnosis present

## 2020-10-24 DIAGNOSIS — Z8541 Personal history of malignant neoplasm of cervix uteri: Secondary | ICD-10-CM | POA: Insufficient documentation

## 2020-10-24 DIAGNOSIS — J449 Chronic obstructive pulmonary disease, unspecified: Secondary | ICD-10-CM | POA: Insufficient documentation

## 2020-10-24 DIAGNOSIS — R748 Abnormal levels of other serum enzymes: Secondary | ICD-10-CM | POA: Insufficient documentation

## 2020-10-24 LAB — URINALYSIS, ROUTINE W REFLEX MICROSCOPIC
Bilirubin Urine: NEGATIVE
Glucose, UA: NEGATIVE mg/dL
Hgb urine dipstick: NEGATIVE
Ketones, ur: NEGATIVE mg/dL
Leukocytes,Ua: NEGATIVE
Nitrite: NEGATIVE
Protein, ur: NEGATIVE mg/dL
Specific Gravity, Urine: 1.014 (ref 1.005–1.030)
pH: 6 (ref 5.0–8.0)

## 2020-10-24 LAB — CBC
HCT: 40.1 % (ref 36.0–46.0)
Hemoglobin: 13.5 g/dL (ref 12.0–15.0)
MCH: 34.3 pg — ABNORMAL HIGH (ref 26.0–34.0)
MCHC: 33.7 g/dL (ref 30.0–36.0)
MCV: 101.8 fL — ABNORMAL HIGH (ref 80.0–100.0)
Platelets: 256 10*3/uL (ref 150–400)
RBC: 3.94 MIL/uL (ref 3.87–5.11)
RDW: 13.1 % (ref 11.5–15.5)
WBC: 13.6 10*3/uL — ABNORMAL HIGH (ref 4.0–10.5)
nRBC: 0 % (ref 0.0–0.2)

## 2020-10-24 LAB — COMPREHENSIVE METABOLIC PANEL
ALT: 24 U/L (ref 0–44)
AST: 27 U/L (ref 15–41)
Albumin: 3.6 g/dL (ref 3.5–5.0)
Alkaline Phosphatase: 64 U/L (ref 38–126)
Anion gap: 9 (ref 5–15)
BUN: 6 mg/dL (ref 6–20)
CO2: 26 mmol/L (ref 22–32)
Calcium: 9 mg/dL (ref 8.9–10.3)
Chloride: 103 mmol/L (ref 98–111)
Creatinine, Ser: 0.55 mg/dL (ref 0.44–1.00)
GFR, Estimated: >60 mL/min (ref >60)
Glucose, Bld: 97 mg/dL (ref 70–99)
Potassium: 3.7 mmol/L (ref 3.5–5.1)
Sodium: 138 mmol/L (ref 135–145)
Total Bilirubin: 0.9 mg/dL (ref 0.3–1.2)
Total Protein: 6.4 g/dL — ABNORMAL LOW (ref 6.5–8.1)

## 2020-10-24 LAB — I-STAT BETA HCG BLOOD, ED (MC, WL, AP ONLY): I-stat hCG, quantitative: <5 m[IU]/mL (ref <5)

## 2020-10-24 LAB — LIPASE, BLOOD: Lipase: 58 U/L — ABNORMAL HIGH (ref 11–51)

## 2020-10-24 MED ORDER — FAMOTIDINE IN NACL 20-0.9 MG/50ML-% IV SOLN
20.0000 mg | Freq: Once | INTRAVENOUS | Status: AC
  Administered 2020-10-24: 20 mg via INTRAVENOUS
  Filled 2020-10-24: qty 50

## 2020-10-24 MED ORDER — SODIUM CHLORIDE 0.9 % IV BOLUS
1000.0000 mL | Freq: Once | INTRAVENOUS | Status: AC
  Administered 2020-10-24: 1000 mL via INTRAVENOUS

## 2020-10-24 MED ORDER — IOHEXOL 300 MG/ML  SOLN
100.0000 mL | Freq: Once | INTRAMUSCULAR | Status: AC | PRN
  Administered 2020-10-24: 100 mL via INTRAVENOUS

## 2020-10-24 MED ORDER — MORPHINE SULFATE (PF) 4 MG/ML IV SOLN
4.0000 mg | Freq: Once | INTRAVENOUS | Status: AC
Start: 2020-10-24 — End: 2020-10-24
  Administered 2020-10-24: 4 mg via INTRAVENOUS
  Filled 2020-10-24: qty 1

## 2020-10-24 MED ORDER — ONDANSETRON 4 MG PO TBDP: 4.0000 mg | ORAL_TABLET | Freq: Three times a day (TID) | ORAL | 0 refills | Status: DC | PRN

## 2020-10-24 MED ORDER — ALUM & MAG HYDROXIDE-SIMETH 400-400-40 MG/5ML PO SUSP: 15.0000 mL | Freq: Four times a day (QID) | ORAL | 0 refills | Status: DC | PRN

## 2020-10-24 MED ORDER — ONDANSETRON HCL 4 MG/2ML IJ SOLN
4.0000 mg | Freq: Once | INTRAMUSCULAR | Status: AC
  Administered 2020-10-24: 4 mg via INTRAVENOUS
  Filled 2020-10-24: qty 2

## 2020-10-24 NOTE — ED Triage Notes (Signed)
Patient coming from home. Complaint of n/v/d that began last week. Worse this weekend. VSS. NAD.

## 2020-10-24 NOTE — Discharge Instructions (Addendum)
You came to the emergency department today to be evaluated for your epigastric pain, nausea, and vomiting.  Your lab work was reassuring.  Your CT scan showed that you have chronic pancreatitis with a possible acute flare.  Please eat a bland diet until your symptoms improve.  Do not drink any alcohol.  Please drink plenty of water in small frequent sips.  I have given you a prescription for Zofran to help with your nausea.  You may take 1 pill as needed every 8 hours for nausea and vomiting.  Also given you prescription for a GI cocktail containing Mylanta and Maalox.  Please take this as prescribed.  Please take Ibuprofen (Advil, motrin) to relieve your pain.    You may take up to 600 MG (3 pills) of normal strength ibuprofen every 8 hours as needed.    It is safe to take ibuprofen and tylenol at the same time as they work differently.   Do not drink alcohol while taking these medications.  Do not take other NSAID'S while taking ibuprofen (such as aleve or naproxen).  Please take ibuprofen with food to decrease stomach upset.   Please follow-up with your primary care provider if your symptoms do not improve.  Please follow-up with your GI physician Dr. Hilarie Fredrickson.  Get help right away if: You cannot eat or keep fluids down. Your pain becomes severe. Your skin or the white part of your eyes turns yellow (jaundice). You have sudden swelling in your abdomen. You vomit. You feel dizzy or you faint. Your blood sugar is high (over 300 mg/dL).

## 2020-10-24 NOTE — ED Notes (Signed)
Patient transported to CT °

## 2020-10-24 NOTE — ED Provider Notes (Cosign Needed Addendum)
[facility] EMERGENCY DEPARTMENT Provider Note   CSN: [id number] Arrival date & time: 10/24/20  6440     History Chief Complaint  Patient presents with  . Nausea  . Emesis  . Diarrhea    [facility] is a 45 y.o. female chronic pancreatitis, cirrhosis, GERD, alcohol use disorder.  Presents with a chief complaint of abdominal pain.  She indicates that her pain is to the epigastric area.  Pain began last week and has been progressively worsening.  Patient rates her pain as a 10/10 on the pain scale.  Patient reports pain radiates to her back.  Patient describes the pain as a sharp stabbing sensation.  No alleviating or aggravating factors.  Patient Dors is associated nausea and vomiting that began this morning.  Patient reports that she has vomited 3 times since 0200 this morning.  Patient reports that emesis was originally stomach tense that progressed to bilious emesis.  Patient denies any frank blood or coffee-ground emesis.    Patient endorses urinary frequency.  Patient denies any fevers, chills, chest pain, shortness of breath, abdominal distention, blood in stool, constipation, diarrhea, dysuria, hematuria, vaginal bleeding, vaginal pain, vaginal discharge, genital sores or lesions, lightheadedness, dizziness, headache, syncopal episode.  Gravida 3 para 3.  Patient had hysterectomy at age 63.    Patient endorses drinking 2-3 beers on the weekends.  Patient reports that she drank 1 beer this weekend.  Patient denies any illicit drug use.  Patient endorses smoking half a pack of cigarettes daily.  Patient states that she is seen GI physician previously.  Per chart review patient is seen by Dr. Hilarie Fredrickson with [facility].  Per chart review patient was seen 05/13/2020 at [facility] emergency department for chronic pancreatitis.  HPI     Past Medical History:  Diagnosis Date  . Alcohol abuse   . Anxiety   . Bipolar 1 disorder ([place])   . Cancer (HCC)    cervical  cancer  . Cirrhosis ([place])   . GERD (gastroesophageal reflux disease)   . Pancreatitis   . PTSD (post-traumatic stress disorder)   . Vascular disease     Patient Active Problem List   Diagnosis Date Noted  . Acute on chronic pancreatitis ([place]) 03/22/2019  . Bipolar 1 disorder ([place]) 01/13/2019  . Eczema 10/09/2018  . Acute pancreatitis   . Alcohol induced acute pancreatitis 11/22/2017  . Duodenitis 11/22/2017  . Tobacco abuse counseling   . 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 Nondisplaced fracture of neck of fifth metacarpal bone, right hand, initial encounter for closed fracture 08/26/2017  . Alcoholic cirrhosis of liver without ascites ([place]) 10/15/2016  . Alcohol abuse 09/04/2016  . Gastritis and gastroduodenitis 09/04/2016  . Tobacco abuse 09/04/2016  . COPD (chronic obstructive pulmonary disease) ([place]) 09/04/2016  . Elevated liver enzymes 12/22/2014  . Abdominal pain, epigastric 12/16/2014  . Chest pain with moderate risk for cardiac etiology 12/15/2014  . Alcohol dependence ([place]) 12/16/2012    Class: Acute    Past Surgical History:  Procedure Laterality Date  . ABDOMINAL HYSTERECTOMY  2004  . BIOPSY  01/02/2018   Procedure: BIOPSY;  Surgeon: Milus Banister, MD;  Location: [facility];  Service: Endoscopy;;  . ESOPHAGOGASTRODUODENOSCOPY (EGD) WITH PROPOFOL N/A 01/02/2018   Procedure: ESOPHAGOGASTRODUODENOSCOPY (EGD) WITH PROPOFOL;  Surgeon: Milus Banister, MD;  Location: [facility];  Service: Endoscopy;  Laterality: N/A;  . EUS N/A 01/02/2018   Procedure: UPPER ENDOSCOPIC ULTRASOUND (EUS) RADIAL;  Surgeon: Milus Banister,  MD;  Location: [facility] END[facility];  Service: Endoscopy;  Laterality: N/A;  . FOOT SURGERY    . OTHER SURGICAL HISTORY     ovarian surgery  . TUBAL LIGATION       OB History   No obstetric history on file.     Family History  Problem Relation Age of Onset  . Hypertension Mother   . Breast cancer Mother 27  . Pancreatitis Mother   .  Hypertension Father   . Heart attack Cousin 30       died in 55s of MI  . Breast cancer Maternal Aunt   . Breast cancer Maternal Aunt   . Colon cancer Neg Hx   . Esophageal cancer Neg Hx   . Rectal cancer Neg Hx   . Stomach cancer Neg Hx     Social History   Tobacco Use  . Smoking status: Current Every Day Smoker    Packs/day: 0.50    Types: Cigarettes  . Smokeless tobacco: Never Used  . Tobacco comment: 2-3 cigarettes daily  Vaping Use  . Vaping Use: Never used  Substance Use Topics  . Alcohol use: Not Currently    Comment: no alchol for the past 3 weeks  . Drug use: No    Home Medications Prior to Admission medications   Medication Sig Start Date End Date Taking? Authorizing Provider  pantoprazole (PROTONIX) 40 MG tablet Take 1 tablet (40 mg total) by mouth 2 (two) times daily. 10/11/20   Charlott Rakes, MD  albuterol (VENTOLIN HFA) 108 (90 Base) MCG/ACT inhaler Inhale 1-2 puffs into the lungs every 6 (six) hours as needed for wheezing or shortness of breath. 04/01/20   Charlott Rakes, MD  benzonatate (TESSALON) 100 MG capsule Take 2 capsules (200 mg total) by mouth 3 (three) times daily as needed for cough. 08/15/20   Margarette Canada, NP  cetirizine (ZYRTEC) 10 MG tablet TAKE 1 TABLET BY MOUTH EVERY DAY 06/23/20   Charlott Rakes, MD  famotidine (PEPCID) 20 MG tablet Take 1 tablet (20 mg total) by mouth 2 (two) times daily. 08/24/20   Argentina Donovan, PA-C  gabapentin (NEURONTIN) 300 MG capsule Take 1 capsule (300 mg total) by mouth at bedtime. Patient not taking: No sig reported 08/25/19   Charlott Rakes, MD  methocarbamol (ROBAXIN) 500 MG tablet Take 1 tablet (500 mg total) by mouth every 8 (eight) hours as needed for muscle spasms. 07/28/20   Rudolpho Sevin, NP  Multiple Vitamin (MULTIVITAMIN WITH MINERALS) TABS tablet Take 1 tablet by mouth daily.    [provider]  naproxen (NAPROSYN) 500 MG tablet Take 1 tablet (500 mg total) by mouth 2 (two) times daily as  needed. 07/28/20   Rudolpho Sevin, NP  nicotine (NICODERM CQ - DOSED IN MG/24 HOURS) 21 mg/24hr patch Place 1 patch (21 mg total) onto the skin daily. Patient not taking: No sig reported 06/04/18   Charlott Rakes, MD  olopatadine (PATADAY) 0.1 % ophthalmic solution Place 1 drop into both eyes 2 (two) times daily. 12/22/19   Charlott Rakes, MD  olopatadine (PATANOL) 0.1 % ophthalmic solution Place 1 drop into both eyes 2 (two) times daily. 12/21/19   Charlott Rakes, MD  ondansetron (ZOFRAN ODT) 4 MG disintegrating tablet Take 1 tablet (4 mg total) by mouth every 8 (eight) hours as needed for nausea or vomiting. 05/13/20   Lucrezia Starch, MD  predniSONE (DELTASONE) 20 MG tablet 3-3-3-2-2-2-1-1-1 10/10/20   Zigmund Gottron, NP  triamcinolone cream (  KENALOG) 0.1 % Apply 1 application topically 2 (two) times daily. 12/17/19   Charlott Rakes, MD  cloNIDine (CATAPRES) 0.1 MG tablet Take 1 tablet (0.1 mg total) by mouth at bedtime. For hot flashes Patient not taking: Reported on 10/17/2019 01/13/19 08/15/20  Charlott Rakes, MD    Allergies    Patient has no known allergies.  Review of Systems   Review of Systems  Constitutional: Negative for chills and fever.  Eyes: Negative for visual disturbance.  Respiratory: Negative for shortness of breath.   Cardiovascular: Negative for chest pain.  Gastrointestinal: Positive for abdominal pain, nausea and vomiting. Negative for abdominal distention, anal bleeding, blood in stool, constipation, diarrhea and rectal pain.  Genitourinary: Positive for frequency. Negative for difficulty urinating, dysuria, genital sores, hematuria, vaginal bleeding, vaginal discharge and vaginal pain.  Musculoskeletal: Negative for back pain, myalgias and neck pain.  Skin: Negative for color change and rash.  Neurological: Negative for dizziness, syncope, light-headedness and headaches.  Psychiatric/Behavioral: Negative for confusion.    Physical Exam Updated Vital Signs BP  (!) 156/111 (BP Location: Left Arm)   Pulse 60   Temp 98.2 F (36.8 C) (Oral)   Resp 14   Ht 5\' 4"  (1.626 m)   Wt 51.3 kg   SpO2 100%   BMI 19.40 kg/m   Physical Exam Vitals and nursing note reviewed.  Constitutional:      General: She is not in acute distress.    Appearance: She is not ill-appearing, toxic-appearing or diaphoretic.  HENT:     Head: Normocephalic.  Eyes:     General: No scleral icterus.       Right eye: No discharge.        Left eye: No discharge.  Cardiovascular:     Rate and Rhythm: Normal rate.     Pulses:          Carotid pulses are 3+ on the right side and 3+ on the left side.      Radial pulses are 3+ on the right side and 3+ on the left side.     Heart sounds: Normal heart sounds.  Pulmonary:     Effort: Pulmonary effort is normal. No respiratory distress.     Breath sounds: Normal breath sounds. No stridor. No wheezing, rhonchi or rales.  Abdominal:     General: Abdomen is flat. Bowel sounds are normal. There is no distension. There are no signs of injury.     Palpations: Abdomen is soft. There is no mass or pulsatile mass.     Tenderness: There is abdominal tenderness in the epigastric area. There is no guarding or rebound. Positive signs include Murphy's sign. Negative signs include McBurney's sign.     Hernia: There is no hernia in the umbilical area or ventral area.  Musculoskeletal:     Cervical back: Normal range of motion and neck supple.     Right lower leg: No swelling, deformity, lacerations, tenderness or bony tenderness. No edema.     Left lower leg: No swelling, deformity, lacerations, tenderness or bony tenderness. No edema.  Skin:    General: Skin is warm and dry.     Coloration: Skin is not jaundiced or pale.  Neurological:     General: No focal deficit present.     Mental Status: She is alert.  Psychiatric:        Behavior: Behavior is cooperative.     ED Results / Procedures / Treatments   Labs (all labs ordered are  listed, but only abnormal results are displayed) Labs Reviewed  LIPASE, BLOOD - Abnormal; Notable for the following components:      Result Value   Lipase 58 (*)    All other components within normal limits  COMPREHENSIVE METABOLIC PANEL - Abnormal; Notable for the following components:   Total Protein 6.4 (*)    All other components within normal limits  CBC - Abnormal; Notable for the following components:   WBC 13.6 (*)    MCV 101.8 (*)    MCH 34.3 (*)    All other components within normal limits  URINALYSIS, ROUTINE W REFLEX MICROSCOPIC  I-STAT BETA HCG BLOOD, ED ([facility], [facility], [facility] ONLY)    EKG None  Radiology CT ABDOMEN PELVIS W CONTRAST  Result Date: 10/24/2020 CLINICAL DATA:  Nausea, vomiting and diarrhea for several days. EXAM: CT ABDOMEN AND PELVIS WITH CONTRAST TECHNIQUE: Multidetector CT imaging of the abdomen and pelvis was performed using the standard protocol following bolus administration of intravenous contrast. CONTRAST:  100 mL OMNIPAQUE IOHEXOL 300 MG/ML  SOLN COMPARISON:  CT abdomen and pelvis 05/13/2020. FINDINGS: Lower chest: The lung bases are clear. No pleural or pericardial effusion. Hepatobiliary: No focal lesion. There is subtle nodularity of the liver border. Gallbladder and biliary tree are unremarkable. Pancreas: Calcifications about the head and neck of the pancreas are again seen. There is a small amount of ill-defined fluid about the head and neck of the pancreas. No focal fluid collection is identified. The pancreas enhances homogeneously. No pancreatic mass is seen. Spleen: Normal in size without focal abnormality. Adrenals/Urinary Tract: Adrenal glands are unremarkable. Kidneys are normal, without renal calculi, focal lesion, or hydronephrosis. Bladder is unremarkable. Stomach/Bowel: Stomach is within normal limits. The appendix is not visualized but no evidence of appendicitis is seen. No evidence of bowel wall thickening, distention, or inflammatory changes.  Vascular/Lymphatic: Aortic atherosclerosis. No enlarged abdominal or pelvic lymph nodes. Reproductive: Uterus and bilateral adnexa are unremarkable. Other: There is a small volume of free pelvic fluid. Musculoskeletal: No acute or focal bony abnormality. IMPRESSION: Chronic pancreatitis with a small volume fluid about the head and neck of the pancreas and free pelvic fluid worrisome for superimposed acute pancreatitis. No evidence of pancreatic necrosis or pseudocyst formation. Subtle nodularity of the liver border consistent with cirrhosis. Aortic Atherosclerosis (ICD10-I70.0). Electronically Signed   By: Inge Rise M.D.   On: 10/24/2020 13:08    Procedures Procedures   Medications Ordered in ED Medications  morphine 4 MG/ML injection 4 mg (4 mg Intravenous Given 10/24/20 1118)  ondansetron (ZOFRAN) injection 4 mg (4 mg Intravenous Given 10/24/20 1114)  famotidine (PEPCID) IVPB 20 mg premix (0 mg Intravenous Stopped 10/24/20 1208)  sodium chloride 0.9 % bolus 1,000 mL (0 mLs Intravenous Stopped 10/24/20 1242)  iohexol (OMNIPAQUE) 300 MG/ML solution 100 mL (100 mLs Intravenous Contrast Given 10/24/20 1251)    ED Course  I have reviewed the triage vital signs and the nursing notes.  Pertinent labs & imaging results that were available during my care of the patient were reviewed by me and considered in my medical decision making (see chart for details).  Clinical Course as of 10/24/20 1322  Mon Oct 24, 2020  1039 Platelets: 256 [PB]    Clinical Course User Index [PB] Dyann Ruddle   MDM Rules/Calculators/A&P                          Alert 45 year old female in no  acute distress, nontoxic appearing.  Patient does appear uncomfortable due to her complaint of epigastric abdominal pain.  Patient has a history of chronic pancreatitis, alcohol use disorder, GERD, and cirrhosis.    Per chart review patient has seen by Dr. Hilarie Fredrickson with [facility].  Per chart review patient was seen  05/13/2020 at [facility] emergency department for chronic pancreatitis.    On physical exam patient NABS, abdomen is soft, nondistended, and tenderness to epigastric area, positive Murphy sign.    Concern for hepatobiliary dysfunction, acute pancreatitis.  Will obtain CT abdomen pelvis.  Patient given morphine, Zofran, Pepcid, and IV fluid bolus.  UA shows no sign of infection.   . CMP shows total protein slightly decreased at 6.4 with all other values within normal limits. Slight leukocytosis at 13.6, no signs of acute blood loss anemia with hemoglobin, hematocrit, and RBC all within normal limits.  CT scan shows chronic pancreatitis with small volume fluid about the head and neck of the pancreas and free pelvic fluid worrisome for superimposed acute pancreatitis.  No evidence of pancreatic necrosis or pseudocyst formation.  Subtle nodularity of the liver border consistent with cirrhosis.  Lipase slightly elevated at 58; low suspicion for acute pancreatitis as level is not 3 times normal value.  On repeat examination patient reports resolution of her epigastric pain and nausea.  Patient abdomen is soft, nondistended, nontender.  Able to tolerate p.o. liquids without difficulty.  Will give patient prescription for Zofran and have her follow-up with GI provider as well as PCP.  Discussed results, findings, treatment and follow up. Patient advised of return precautions. Patient verbalized understanding and agreed with plan.   Final Clinical Impression(s) / ED Diagnoses Final diagnoses:  Chronic pancreatitis, unspecified pancreatitis type [facility])    Rx / DC Orders ED Discharge Orders         Ordered    ondansetron (ZOFRAN ODT) 4 MG disintegrating tablet  Every 8 hours PRN        10/24/20 1348    alum & mag hydroxide-simeth [facility] MAXIMUM STRENGTH) 400-400-40 MG/5ML suspension  Every 6 hours PRN        10/24/20 1348           Loni Beckwith, PA-C 10/24/20 1350    [id number] PA-C 10/24/20 1404    Pattricia Boss, MD 10/25/20 [phone]

## 2020-10-25 ENCOUNTER — Telehealth: Payer: Self-pay | Admitting: *Deleted

## 2020-10-25 NOTE — Telephone Encounter (Signed)
Transition Care Management Unsuccessful Follow-up Telephone Call  Date of discharge and from where:  10/24/2020 [facility]  Attempts:  1st Attempt  Reason for unsuccessful TCM follow-up call:  Unable to leave message

## 2020-10-26 NOTE — Telephone Encounter (Signed)
Transition Care Management Unsuccessful Follow-up Telephone Call  Date of discharge and from where:  10/24/2020 [facility]  Attempts:  2nd Attempt  Reason for unsuccessful TCM follow-up call:  Unable to leave message

## 2020-10-27 NOTE — Telephone Encounter (Signed)
Transition Care Management Unsuccessful Follow-up Telephone Call  Date of discharge and from where:  10/24/2020 from [facility]  Attempts:  3rd Attempt  Reason for unsuccessful TCM follow-up call:  Left voice message    Did leave a message with female that answered to have patient call back if able today.

## 2020-10-28 LAB — URINE CULTURE: Culture: NO GROWTH

## 2020-11-18 ENCOUNTER — Ambulatory Visit (INDEPENDENT_AMBULATORY_CARE_PROVIDER_SITE_OTHER): Payer: Medicaid Other | Admitting: Vascular Surgery

## 2020-11-18 ENCOUNTER — Other Ambulatory Visit: Payer: Self-pay

## 2020-11-18 ENCOUNTER — Encounter: Payer: Self-pay | Admitting: Vascular Surgery

## 2020-11-18 ENCOUNTER — Ambulatory Visit (HOSPITAL_COMMUNITY)
Admission: RE | Admit: 2020-11-18 | Discharge: 2020-11-18 | Disposition: A | Discharge status: Home / Self Care | Payer: Medicaid Other | Source: Ambulatory Visit | Attending: Vascular Surgery | Admitting: Vascular Surgery

## 2020-11-18 VITALS — BP 119/86 | HR 69 | Temp 98.1°F | Resp 20 | Ht 64.0 in | Wt 104.0 lb

## 2020-11-18 DIAGNOSIS — I6523 Occlusion and stenosis of bilateral carotid arteries: Secondary | ICD-10-CM

## 2020-11-18 MED ORDER — CLOPIDOGREL BISULFATE 75 MG PO TABS: 75.0000 mg | ORAL_TABLET | Freq: Every day | ORAL | 11 refills | Status: DC

## 2020-11-18 MED ORDER — ROSUVASTATIN CALCIUM 10 MG PO TABS: 10.0000 mg | ORAL_TABLET | Freq: Every day | ORAL | 11 refills | Status: DC
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
## 2020-11-29 DIAGNOSIS — K7460 Unspecified cirrhosis of liver: Secondary | ICD-10-CM | POA: Insufficient documentation

## 2020-11-29 DIAGNOSIS — F4310 Post-traumatic stress disorder, unspecified: Secondary | ICD-10-CM | POA: Insufficient documentation

## 2020-11-29 DIAGNOSIS — K859 Acute pancreatitis without necrosis or infection, unspecified: Secondary | ICD-10-CM | POA: Insufficient documentation

## 2020-11-29 DIAGNOSIS — K219 Gastro-esophageal reflux disease without esophagitis: Secondary | ICD-10-CM | POA: Insufficient documentation

## 2020-11-29 DIAGNOSIS — F419 Anxiety disorder, unspecified: Secondary | ICD-10-CM | POA: Insufficient documentation

## 2020-11-29 DIAGNOSIS — I999 Unspecified disorder of circulatory system: Secondary | ICD-10-CM | POA: Insufficient documentation

## 2020-11-29 DIAGNOSIS — C801 Malignant (primary) neoplasm, unspecified: Secondary | ICD-10-CM | POA: Insufficient documentation

## 2020-12-01 ENCOUNTER — Other Ambulatory Visit: Payer: Self-pay

## 2020-12-01 ENCOUNTER — Encounter: Payer: Self-pay | Admitting: Cardiology

## 2020-12-01 ENCOUNTER — Ambulatory Visit: Payer: Medicaid Other | Admitting: Cardiology

## 2020-12-01 VITALS — BP 118/84 | HR 84 | Ht 64.0 in | Wt 104.1 lb

## 2020-12-01 DIAGNOSIS — R0602 Shortness of breath: Secondary | ICD-10-CM | POA: Diagnosis not present

## 2020-12-01 DIAGNOSIS — Z720 Tobacco use: Secondary | ICD-10-CM

## 2020-12-01 DIAGNOSIS — I779 Disorder of arteries and arterioles, unspecified: Secondary | ICD-10-CM

## 2020-12-01 DIAGNOSIS — R0789 Other chest pain: Secondary | ICD-10-CM | POA: Diagnosis not present

## 2020-12-01 DIAGNOSIS — R9431 Abnormal electrocardiogram [ECG] [EKG]: Secondary | ICD-10-CM

## 2020-12-01 DIAGNOSIS — R0600 Dyspnea, unspecified: Secondary | ICD-10-CM

## 2020-12-01 DIAGNOSIS — E785 Hyperlipidemia, unspecified: Secondary | ICD-10-CM | POA: Diagnosis not present

## 2020-12-01 DIAGNOSIS — R0609 Other forms of dyspnea: Secondary | ICD-10-CM

## 2020-12-01 MED ORDER — NITROGLYCERIN 0.4 MG SL SUBL: 0.4000 mg | SUBLINGUAL_TABLET | SUBLINGUAL | 3 refills | Status: DC | PRN

## 2020-12-01 MED ORDER — METOPROLOL TARTRATE 100 MG PO TABS: ORAL_TABLET | ORAL | 0 refills | Status: DC

## 2020-12-01 NOTE — Progress Notes (Signed)
Cardiology Office Note:    Date:  12/01/2020   ID:  Felicia Perkins, DOB [birth year], MRN [id number]  PCP:  Charlott Rakes, MD  Cardiologist:  Berniece Salines, DO  Electrophysiologist:  None   Referring MD: Charlott Rakes, MD   I am planning for surgery  History of Present Illness:    Felicia Perkins is a 45 y.o. female with a hx of carotid artery disease with high-grade asymptomatic stenosis in her right ICA with associated bruit, bipolar disorder, tobacco use presents to be evaluated preoperatively for her carotid enterectomy.  Patient tells me that she has been seeing significant for chest pain.  She describes it as a midsternal pressure-like sensation which is on exertion.  She does as well as associated shortness of breath which sometimes she has to sit down to rest to get improvement.   She denies any syncope episode, any nausea, dizziness.  Past Medical History:  Diagnosis Date  . Abdominal pain, epigastric 12/16/2014  . Acute on chronic pancreatitis ([place]) 03/22/2019  . Acute pancreatitis   . Alcohol abuse   . Alcohol dependence ([place]) 12/16/2012   Medical detox successful   . Alcohol induced acute pancreatitis 11/22/2017  . Alcoholic cirrhosis of liver without ascites ([place]) 10/15/2016  . Anxiety   . Bipolar 1 disorder ([place])   . Cancer (HCC)    cervical cancer  . Chest pain with moderate risk for cardiac etiology 12/15/2014  . Cirrhosis ([place])   . COPD (chronic obstructive pulmonary disease) ([place]) 09/04/2016  . Duodenitis 11/22/2017  . Eczema 10/09/2018  . Elevated liver enzymes 12/22/2014  . Gastritis and gastroduodenitis 09/04/2016  . GERD (gastroesophageal reflux disease)   . Nondisplaced fracture of neck of fifth metacarpal bone, right hand, initial encounter for closed fracture 08/26/2017  . Pancreatitis   . 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 PTSD (post-traumatic stress disorder)   . Tobacco abuse 09/04/2016  . Tobacco abuse counseling   . Vascular disease     Past Surgical  History:  Procedure Laterality Date  . ABDOMINAL HYSTERECTOMY  2004  . BIOPSY  01/02/2018   Procedure: BIOPSY;  Surgeon: Milus Banister, MD;  Location: [facility];  Service: Endoscopy;;  . ESOPHAGOGASTRODUODENOSCOPY (EGD) WITH PROPOFOL N/A 01/02/2018   Procedure: ESOPHAGOGASTRODUODENOSCOPY (EGD) WITH PROPOFOL;  Surgeon: Milus Banister, MD;  Location: [facility];  Service: Endoscopy;  Laterality: N/A;  . EUS N/A 01/02/2018   Procedure: UPPER ENDOSCOPIC ULTRASOUND (EUS) RADIAL;  Surgeon: Milus Banister, MD;  Location: [facility];  Service: Endoscopy;  Laterality: N/A;  . FOOT SURGERY    . OTHER SURGICAL HISTORY     ovarian surgery  . TUBAL LIGATION      Current Medications: Current Meds  Medication Sig  . albuterol (VENTOLIN HFA) 108 (90 Base) MCG/ACT inhaler Inhale 1-2 puffs into the lungs every 6 (six) hours as needed for wheezing or shortness of breath.  . clopidogrel (PLAVIX) 75 MG tablet Take 1 tablet (75 mg total) by mouth daily.  . famotidine (PEPCID) 20 MG tablet Take 1 tablet (20 mg total) by mouth 2 (two) times daily.  . folic acid (FOLVITE) 035 MCG tablet Take 400 mcg by mouth daily.  Marland Kitchen ibuprofen (ADVIL) 200 MG tablet Take 400 mg by mouth every 6 (six) hours as needed for moderate pain or headache.  . methotrexate (RHEUMATREX) 2.5 MG tablet Take 2.5 mg by mouth See admin instructions. Take 2.5 mg at 0700 and 1900 Saturday, and 2.5 mg at 0700 on Sunday every  week.  . metoprolol tartrate (LOPRESSOR) 100 MG tablet Take 2 hours prior to CT  . mometasone-formoterol (DULERA) 200-5 MCG/ACT AERO Inhale 2 puffs into the lungs 2 (two) times daily as needed for wheezing.  . Multiple Vitamin (MULTIVITAMIN WITH MINERALS) TABS tablet Take 1 tablet by mouth daily.  . nitroGLYCERIN (NITROSTAT) 0.4 MG SL tablet Place 1 tablet (0.4 mg total) under the tongue every 5 (five) minutes as needed for chest pain. Up to 3 times.  . pantoprazole (PROTONIX) 40 MG tablet Take 1 tablet (40 mg total)  by mouth 2 (two) times daily.  . rosuvastatin (CRESTOR) 10 MG tablet Take 1 tablet (10 mg total) by mouth daily.  . [DISCONTINUED] cetirizine (ZYRTEC) 10 MG tablet Take 10 mg by mouth daily.     Allergies:   Patient has no known allergies.   Social History   Socioeconomic History  . Marital status: Single    Spouse name: Not on file  . Number of children: 3  . Years of education: Not on file  . Highest education level: Not on file  Occupational History  . Occupation: homemaker  Tobacco Use  . Smoking status: Current Every Day Smoker    Packs/day: 0.50    Types: Cigarettes  . Smokeless tobacco: Never Used  . Tobacco comment: 2-3 cigarettes daily  Vaping Use  . Vaping Use: Never used  Substance and Sexual Activity  . Alcohol use: Not Currently    Comment: no alchol for the past 3 weeks  . Drug use: No  . Sexual activity: Yes    Birth control/protection: Condom  Other Topics Concern  . Not on file  Social History Narrative  . Not on file   Social Determinants of Health   Financial Resource Strain: Not on file  Food Insecurity: Not on file  Transportation Needs: Not on file  Physical Activity: Not on file  Stress: Not on file  Social Connections: Not on file     Family History: The patient's family history includes Breast cancer in her maternal aunt and maternal aunt; Breast cancer (age of onset: 20) in her mother; Heart attack (age of onset: 48) in her cousin; Hypertension in her father and mother; Pancreatitis in her mother. There is no history of Colon cancer, Esophageal cancer, Rectal cancer, or Stomach cancer.  ROS:   Review of Systems  Constitution: Negative for decreased appetite, fever and weight gain.  HENT: Negative for congestion, ear discharge, hoarse voice and sore throat.   Eyes: Negative for discharge, redness, vision loss in right eye and visual halos.  Cardiovascular: Negative for chest pain, dyspnea on exertion, leg swelling, orthopnea and  palpitations.  Respiratory: Negative for cough, hemoptysis, shortness of breath and snoring.   Endocrine: Negative for heat intolerance and polyphagia.  Hematologic/Lymphatic: Negative for bleeding problem. Does not bruise/bleed easily.  Skin: Negative for flushing, nail changes, rash and suspicious lesions.  Musculoskeletal: Negative for arthritis, joint pain, muscle cramps, myalgias, neck pain and stiffness.  Gastrointestinal: Negative for abdominal pain, bowel incontinence, diarrhea and excessive appetite.  Genitourinary: Negative for decreased libido, genital sores and incomplete emptying.  Neurological: Negative for brief paralysis, focal weakness, headaches and loss of balance.  Psychiatric/Behavioral: Negative for altered mental status, depression and suicidal ideas.  Allergic/Immunologic: Negative for HIV exposure and persistent infections.    EKGs/Labs/Other Studies Reviewed:    The following studies were reviewed today:   EKG:  The ekg ordered today demonstrates sinus rhythm heart rate 84 bpm with R wave  progression in the precordial leads suggesting old septal infarction.  TTE Impression 2016 Study Conclusions  - Left ventricle: The cavity size was normal. Wall thickness was normal. Systolic function was normal. The estimated ejection fraction was in the range of 60% to 65%. Wall motion was normal;  there were no regional wall motion abnormalities. Left ventricular diastolic function parameters were normal.  - Aortic valve: There was no stenosis.  - Mitral valve: There was no regurgitation.  - Right ventricle: The cavity size was normal. Systolic function was normal.  - Tricuspid valve: Peak RV-RA gradient (S): 18 mm Hg.  - Pulmonary arteries: PA peak pressure: 21 mm Hg (S).  - Inferior vena cava: The vessel was normal in size. The respirophasic diameter changes were in the normal range (=50%), consistent with normal central venous pressure  Recent Labs: 10/24/2020:  ALT 24; BUN 6; Creatinine, Ser 0.55; Hemoglobin 13.5; Platelets 256; Potassium 3.7; Sodium 138  Recent Lipid Panel    Component Value Date/Time   CHOL 240 (H) 03/22/2019 1428   TRIG 90 03/22/2019 1428   HDL 65 03/22/2019 1428   CHOLHDL 3.7 03/22/2019 1428   VLDL 18 03/22/2019 1428   LDLCALC 157 (H) 03/22/2019 1428    Physical Exam:    VS:  BP 118/84 (BP Location: Right Arm)   Pulse 84   Ht 5\' 4"  (1.626 m)   Wt 104 lb 1.9 oz (47.2 kg)   SpO2 98%   BMI 17.87 kg/m     Wt Readings from Last 3 Encounters:  12/01/20 104 lb 1.9 oz (47.2 kg)  11/18/20 104 lb (47.2 kg)  10/24/20 113 lb (51.3 kg)     GEN: Well nourished, well developed in no acute distress HEENT: Normal NECK: No JVD; No carotid bruits LYMPHATICS: No lymphadenopathy CARDIAC: S1S2 noted,RRR, no murmurs, rubs, gallops RESPIRATORY:  Clear to auscultation without rales, wheezing or rhonchi  ABDOMEN: Soft, non-tender, non-distended, +bowel sounds, no guarding. EXTREMITIES: No edema, No cyanosis, no clubbing MUSCULOSKELETAL:  No deformity  SKIN: Warm and dry NEUROLOGIC:  Alert and oriented x 3, non-focal PSYCHIATRIC:  Normal affect, good insight  ASSESSMENT:    1. Other chest pain   2. DOE (dyspnea on exertion)   3. Nonspecific abnormal electrocardiogram (ECG) (EKG)   4. Hyperlipidemia, unspecified hyperlipidemia type   5. Tobacco use   6. SOB (shortness of breath)   7. Right-sided carotid artery disease, unspecified type ([place])    PLAN:    The patient is experiencing symptoms which is concerning for angina given her risk factors and abnormal EKG I like to pursue an ischemic evaluation prior to her procedure.  He is a great candidate for a coronary CTA.  Discussed with the patient she is agreeable to proceed with this testing.  All her questions has been answered.  She has no IV contrast allergy.  Sublingual nitroglycerin prescription was sent, its protocol and 911 protocol explained and the patient vocalized  understanding questions were answered to the patient's satisfaction.  Hyperlipidemia - continue with current statin medication.  The patient was counseled on tobacco cessation today for 5 minutes.  Counseling included reviewing the risks of smoking tobacco products, how it impacts the patient's current medical diagnoses and different strategies for quitting.  Pharmacotherapy to aid in tobacco cessation was not prescribed today. The patient coordinate with  primary care provider.  The patient was also advised to call   1-800-QUIT-NOW 512-197-6947) for additional help with quitting smoking.  The patient is in agreement  with the above plan. The patient left the office in stable condition.  The patient will follow up in 3 months or sooner if needed.   Medication Adjustments/Labs and Tests Ordered: Current medicines are reviewed at length with the patient today.  Concerns regarding medicines are outlined above.  Orders Placed This Encounter  Procedures  . CT CORONARY MORPH W/CTA COR W/SCORE W/CA W/CM &/OR WO/CM  . CT CORONARY FRACTIONAL FLOW RESERVE DATA PREP  . CT CORONARY FRACTIONAL FLOW RESERVE FLUID ANALYSIS  . Basic metabolic panel  . Magnesium  . EKG 12-Lead  . ECHOCARDIOGRAM COMPLETE   Meds ordered this encounter  Medications  . nitroGLYCERIN (NITROSTAT) 0.4 MG SL tablet    Sig: Place 1 tablet (0.4 mg total) under the tongue every 5 (five) minutes as needed for chest pain. Up to 3 times.    Dispense:  90 tablet    Refill:  3  . metoprolol tartrate (LOPRESSOR) 100 MG tablet    Sig: Take 2 hours prior to CT    Dispense:  1 tablet    Refill:  0    Patient Instructions   Medication Instructions:  Your physician has recommended you make the following change in your medication:  START: Nitroglycerin 0.4 mg take one tablet by mouth every 5 minutes up to three times as needed for chest pain.  *If you need a refill on your cardiac medications before your next appointment, please  call your pharmacy*   Lab Work: Your physician recommends that you return for lab work:  3-7 days before CT scan:  BMET, Mag If you have labs (blood work) drawn today and your tests are completely normal, you will receive your results only by: Marland Kitchen MyChart Message (if you have MyChart) OR . A paper copy in the mail If you have any lab test that is abnormal or we need to change your treatment, we will call you to review the results.   Testing/Procedures: Your physician has requested that you have an echocardiogram. Echocardiography is a painless test that uses sound waves to create images of your heart. It provides your doctor with information about the size and shape of your heart and how well your heart's chambers and valves are working. This procedure takes approximately one hour. There are no restrictions for this procedure.  Your cardiac CT will be scheduled:  [facility] [address] [place], [place] [address] [phone]   If scheduled at [facility], please arrive at the [facility] main entrance (entrance A) of [facility] 30 minutes prior to test start time. Proceed to the [facility] Radiology Department (first floor) to check-in and test prep.   Please follow these instructions carefully (unless otherwise directed):   On the Night Before the Test: . Be sure to Drink plenty of water. . Do not consume any caffeinated/decaffeinated beverages or chocolate 12 hours prior to your test. . Do not take any antihistamines 12 hours prior to your test.  On the Day of the Test: . Drink plenty of water until 1 hour prior to the test. . Do not eat any food 4 hours prior to the test. . You may take your regular medications prior to the test.  . Take metoprolol (Lopressor) two hours prior to test. . FEMALES- please wear underwire-free bra if available       After the Test: . Drink plenty of water. . After receiving IV contrast, you may experience a  mild flushed feeling.  This is normal. . On occasion, you may experience a mild rash up to 24 hours after the test. This is not dangerous. If this occurs, you can take Benadryl 25 mg and increase your fluid intake. . If you experience trouble breathing, this can be serious. If it is severe call 911 IMMEDIATELY. If it is mild, please call our office. . If you take any of these medications: Glipizide/Metformin, Avandament, Glucavance, please do not take 48 hours after completing test unless otherwise instructed.   Once we have confirmed authorization from your insurance company, we will call you to set up a date and time for your test. Based on how quickly your insurance processes prior authorizations requests, please allow up to 4 weeks to be contacted for scheduling your Cardiac CT appointment. Be advised that routine Cardiac CT appointments could be scheduled as many as 8 weeks after your provider has ordered it.  For non-scheduling related questions, please contact the cardiac imaging nurse navigator should you have any questions/concerns: Marchia Bond, Cardiac Imaging Nurse Navigator Gordy Clement, Cardiac Imaging Nurse Navigator [facility] Direct Office Dial: [phone]   For scheduling needs, including cancellations and rescheduling, please call [place], [phone].    Follow-Up: At [facility], you and your health needs are our priority.  As part of our continuing mission to provide you with exceptional heart care, we have created designated Provider Care Teams.  These Care Teams include your primary Cardiologist (physician) and Advanced Practice Providers (APPs -  Physician Assistants and Nurse Practitioners) who all work together to provide you with the care you need, when you need it.  We recommend signing up for the patient portal called "MyChart".  Sign up information is provided on this After Visit Summary.  MyChart is used to connect with patients  for Virtual Visits (Telemedicine).  Patients are able to view lab/test results, encounter notes, upcoming appointments, etc.  Non-urgent messages can be sent to your provider as well.   To learn more about what you can do with MyChart, go to [email or web address].    Your next appointment:   3 month(s)  The format for your next appointment:   In Person  Provider:   Berniece Salines, DO   Other Instructions  Echocardiogram An echocardiogram is a test that uses sound waves (ultrasound) to produce images of the heart. Images from an echocardiogram can provide important information about:  Heart size and shape.  The size and thickness and movement of your heart's walls.  Heart muscle function and strength.  Heart valve function or if you have stenosis. Stenosis is when the heart valves are too narrow.  If blood is flowing backward through the heart valves (regurgitation).  A tumor or infectious growth around the heart valves.  Areas of heart muscle that are not working well because of poor blood flow or injury from a heart attack.  Aneurysm detection. An aneurysm is a weak or damaged part of an artery wall. The wall bulges out from the normal force of blood pumping through the body. Tell a health care provider about:  Any allergies you have.  All medicines you are taking, including vitamins, herbs, eye drops, creams, and over-the-counter medicines.  Any blood disorders you have.  Any surgeries you have had.  Any medical conditions you have.  Whether you are pregnant or may be pregnant. What are the risks? Generally, this is a safe test. However, problems may occur, including an allergic reaction to dye (contrast) that  may be used during the test. What happens before the test? No specific preparation is needed. You may eat and drink normally. What happens during the test?  You will take off your clothes from the waist up and put on a hospital gown.  Electrodes or  electrocardiogram (ECG)patches may be placed on your chest. The electrodes or patches are then connected to a device that monitors your heart rate and rhythm.  You will lie down on a table for an ultrasound exam. A gel will be applied to your chest to help sound waves pass through your skin.  A handheld device, called a transducer, will be pressed against your chest and moved over your heart. The transducer produces sound waves that travel to your heart and bounce back (or "echo" back) to the transducer. These sound waves will be captured in real-time and changed into images of your heart that can be viewed on a video monitor. The images will be recorded on a computer and reviewed by your health care provider.  You may be asked to change positions or hold your breath for a short time. This makes it easier to get different views or better views of your heart.  In some cases, you may receive contrast through an IV in one of your veins. This can improve the quality of the pictures from your heart. The procedure may vary among health care providers and hospitals.   What can I expect after the test? You may return to your normal, everyday life, including diet, activities, and medicines, unless your health care provider tells you not to do that. Follow these instructions at home:  It is up to you to get the results of your test. Ask your health care provider, or the department that is doing the test, when your results will be ready.  Keep all follow-up visits. This is important. Summary  An echocardiogram is a test that uses sound waves (ultrasound) to produce images of the heart.  Images from an echocardiogram can provide important information about the size and shape of your heart, heart muscle function, heart valve function, and other possible heart problems.  You do not need to do anything to prepare before this test. You may eat and drink normally.  After the echocardiogram is completed, you  may return to your normal, everyday life, unless your health care provider tells you not to do that. This information is not intended to replace advice given to you by your health care provider. Make sure you discuss any questions you have with your health care provider. Document Revised: 03/22/2020 Document Reviewed: 03/22/2020 Elsevier Patient Education  2021 [employer].      Adopting a Healthy Lifestyle.  Know what a healthy weight is for you (roughly BMI <25) and aim to maintain this   Aim for 7+ servings of fruits and vegetables daily   65-80+ fluid ounces of water or unsweet tea for healthy kidneys   Limit to max 1 drink of alcohol per day; avoid smoking/tobacco   Limit animal fats in diet for cholesterol and heart health - choose grass fed whenever available   Avoid highly processed foods, and foods high in saturated/trans fats   Aim for low stress - take time to unwind and care for your mental health   Aim for 150 min of moderate intensity exercise weekly for heart health, and weights twice weekly for bone health   Aim for 7-9 hours of sleep daily   When it comes to  diets, agreement about the perfect plan isnt easy to find, even among the experts. Experts at the [employer] developed an idea known as the Healthy Eating Plate. Just imagine a plate divided into logical, healthy portions.   The emphasis is on diet quality:   Load up on vegetables and fruits - one-half of your plate: Aim for color and variety, and remember that potatoes dont count.   Go for whole grains - one-quarter of your plate: Whole wheat, barley, wheat berries, quinoa, oats, brown rice, and foods made with them. If you want pasta, go with whole wheat pasta.   Protein power - one-quarter of your plate: Fish, chicken, beans, and nuts are all healthy, versatile protein sources. Limit red meat.   The diet, however, does go beyond the plate, offering a few other suggestions.    Use healthy plant oils, such as olive, canola, soy, corn, sunflower and peanut. Check the labels, and avoid partially hydrogenated oil, which have unhealthy trans fats.   If youre thirsty, drink water. Coffee and tea are good in moderation, but skip sugary drinks and limit milk and dairy products to one or two daily servings.   The type of carbohydrate in the diet is more important than the amount. Some sources of carbohydrates, such as vegetables, fruits, whole grains, and beans-are healthier than others.   Finally, stay active  Signed, Berniece Salines, DO  12/01/2020 1:43 PM    [facility]

## 2020-12-01 NOTE — Patient Instructions (Addendum)
Medication Instructions:  Your physician has recommended you make the following change in your medication:  START: Nitroglycerin 0.4 mg take one tablet by mouth every 5 minutes up to three times as needed for chest pain.  *If you need a refill on your cardiac medications before your next appointment, please call your pharmacy*   Lab Work: Your physician recommends that you return for lab work:  3-7 days before CT scan:  BMET, Mag If you have labs (blood work) drawn today and your tests are completely normal, you will receive your results only by: Marland Kitchen MyChart Message (if you have MyChart) OR . A paper copy in the mail If you have any lab test that is abnormal or we need to change your treatment, we will call you to review the results.   Testing/Procedures: Your physician has requested that you have an echocardiogram. Echocardiography is a painless test that uses sound waves to create images of your heart. It provides your doctor with information about the size and shape of your heart and how well your heart's chambers and valves are working. This procedure takes approximately one hour. There are no restrictions for this procedure.  Your cardiac CT will be scheduled:  [facility] [address] [place], [place] [address] [phone]   If scheduled at [facility], please arrive at the [facility] main entrance (entrance A) of [facility] 30 minutes prior to test start time. Proceed to the [facility] Radiology Department (first floor) to check-in and test prep.   Please follow these instructions carefully (unless otherwise directed):   On the Night Before the Test: . Be sure to Drink plenty of water. . Do not consume any caffeinated/decaffeinated beverages or chocolate 12 hours prior to your test. . Do not take any antihistamines 12 hours prior to your test.  On the Day of the Test: . Drink plenty of water until 1 hour prior to the test. . Do not eat  any food 4 hours prior to the test. . You may take your regular medications prior to the test.  . Take metoprolol (Lopressor) two hours prior to test. . FEMALES- please wear underwire-free bra if available       After the Test: . Drink plenty of water. . After receiving IV contrast, you may experience a mild flushed feeling. This is normal. . On occasion, you may experience a mild rash up to 24 hours after the test. This is not dangerous. If this occurs, you can take Benadryl 25 mg and increase your fluid intake. . If you experience trouble breathing, this can be serious. If it is severe call 911 IMMEDIATELY. If it is mild, please call our office. . If you take any of these medications: Glipizide/Metformin, Avandament, Glucavance, please do not take 48 hours after completing test unless otherwise instructed.   Once we have confirmed authorization from your insurance company, we will call you to set up a date and time for your test. Based on how quickly your insurance processes prior authorizations requests, please allow up to 4 weeks to be contacted for scheduling your Cardiac CT appointment. Be advised that routine Cardiac CT appointments could be scheduled as many as 8 weeks after your provider has ordered it.  For non-scheduling related questions, please contact the cardiac imaging nurse navigator should you have any questions/concerns: Marchia Bond, Cardiac Imaging Nurse Navigator Gordy Clement, Cardiac Imaging Nurse Navigator [facility] Direct Office Dial: [phone]  For scheduling needs, including cancellations and rescheduling, please call [place], [phone].    Follow-Up: At [facility] you and your health needs are our priority.  As part of our continuing mission to provide you with exceptional heart care, we have created designated Provider Care Teams.  These Care Teams include your primary Cardiologist (physician) and Advanced Practice  Providers (APPs -  Physician Assistants and Nurse Practitioners) who all work together to provide you with the care you need, when you need it.  We recommend signing up for the patient portal called "MyChart".  Sign up information is provided on this After Visit Summary.  MyChart is used to connect with patients for Virtual Visits (Telemedicine).  Patients are able to view lab/test results, encounter notes, upcoming appointments, etc.  Non-urgent messages can be sent to your provider as well.   To learn more about what you can do with MyChart, go to [email or web address].    Your next appointment:   3 month(s)  The format for your next appointment:   In Person  Provider:   Berniece Salines, DO   Other Instructions  Echocardiogram An echocardiogram is a test that uses sound waves (ultrasound) to produce images of the heart. Images from an echocardiogram can provide important information about:  Heart size and shape.  The size and thickness and movement of your heart's walls.  Heart muscle function and strength.  Heart valve function or if you have stenosis. Stenosis is when the heart valves are too narrow.  If blood is flowing backward through the heart valves (regurgitation).  A tumor or infectious growth around the heart valves.  Areas of heart muscle that are not working well because of poor blood flow or injury from a heart attack.  Aneurysm detection. An aneurysm is a weak or damaged part of an artery wall. The wall bulges out from the normal force of blood pumping through the body. Tell a health care provider about:  Any allergies you have.  All medicines you are taking, including vitamins, herbs, eye drops, creams, and over-the-counter medicines.  Any blood disorders you have.  Any surgeries you have had.  Any medical conditions you have.  Whether you are pregnant or may be pregnant. What are the risks? Generally, this is a safe test. However, problems may occur,  including an allergic reaction to dye (contrast) that may be used during the test. What happens before the test? No specific preparation is needed. You may eat and drink normally. What happens during the test?  You will take off your clothes from the waist up and put on a hospital gown.  Electrodes or electrocardiogram (ECG)patches may be placed on your chest. The electrodes or patches are then connected to a device that monitors your heart rate and rhythm.  You will lie down on a table for an ultrasound exam. A gel will be applied to your chest to help sound waves pass through your skin.  A handheld device, called a transducer, will be pressed against your chest and moved over your heart. The transducer produces sound waves that travel to your heart and bounce back (or "echo" back) to the transducer. These sound waves will be captured in real-time and changed into images of your heart that can be viewed on a video monitor. The images will be recorded on a computer and reviewed by your health care provider.  You may be asked to change positions or hold your breath for a short time. This makes it easier to  get different views or better views of your heart.  In some cases, you may receive contrast through an IV in one of your veins. This can improve the quality of the pictures from your heart. The procedure may vary among health care providers and hospitals.   What can I expect after the test? You may return to your normal, everyday life, including diet, activities, and medicines, unless your health care provider tells you not to do that. Follow these instructions at home:  It is up to you to get the results of your test. Ask your health care provider, or the department that is doing the test, when your results will be ready.  Keep all follow-up visits. This is important. Summary  An echocardiogram is a test that uses sound waves (ultrasound) to produce images of the heart.  Images from an  echocardiogram can provide important information about the size and shape of your heart, heart muscle function, heart valve function, and other possible heart problems.  You do not need to do anything to prepare before this test. You may eat and drink normally.  After the echocardiogram is completed, you may return to your normal, everyday life, unless your health care provider tells you not to do that. This information is not intended to replace advice given to you by your health care provider. Make sure you discuss any questions you have with your health care provider. Document Revised: 03/22/2020 Document Reviewed: 03/22/2020 Elsevier Patient Education  2021 [employer].

## 2020-12-02 NOTE — Progress Notes (Signed)
Surgical Instructions    Your procedure is scheduled on Thursday, April 28,2022  Report to [facility] Main Entrance "A" at 0530 A.M., then check in with the Admitting office.  Call this number if you have problems the morning of surgery:  [phone]   If you have any questions prior to your surgery date call [phone]: Open Monday-Friday 8am-4pm    Remember:  Do not eat or drink after midnight the night before your surgery     Take these medicines the morning of surgery with A SIP OF WATER :              Clopidogrel(Plavix)              Famotidine(Pepcid)              Pantopazole(Protonix)              Rosuvastatin(Crestor)                        If needed you may take:               Albuterol(Ventolin HFA) inhaler               Nitroglycerin(nitrostat)  As of today, STOP taking any Aspirin (unless otherwise instructed by your surgeon) Aleve, Naproxen, Ibuprofen, Motrin, Advil, Goody's, BC's, all herbal medications, fish oil, and all vitamins.                     Do not wear jewelry, make up, or nail polish            Do not wear lotions, powders, perfumes/colognes, or deodorant.            Do not shave 48 hours prior to surgery.  Men may shave face and neck.            Do not bring valuables to the hospital.            [facility] is not responsible for any belongings or valuables.  Do NOT Smoke (Tobacco/Vaping) or drink Alcohol 24 hours prior to your procedure If you use a CPAP at night, you may bring all equipment for your overnight stay.   Contacts, glasses, dentures or bridgework may not be worn into surgery, please bring cases for these belongings   For patients admitted to the hospital, discharge time will be determined by your treatment team.   Patients discharged the day of surgery will not be allowed to drive home, and someone needs to stay with them for 24 hours.    Special instructions:   [place]- Preparing For Surgery  Before surgery, you can play  an important role. Because skin is not sterile, your skin needs to be as free of germs as possible. You can reduce the number of germs on your skin by washing with CHG (chlorahexidine gluconate) Soap before surgery.  CHG is an antiseptic cleaner which kills germs and bonds with the skin to continue killing germs even after washing.    Oral Hygiene is also important to reduce your risk of infection.  Remember - BRUSH YOUR TEETH THE MORNING OF SURGERY WITH YOUR REGULAR TOOTHPASTE  Please do not use if you have an allergy to CHG or antibacterial soaps. If your skin becomes reddened/irritated stop using the CHG.  Do not shave (including legs and underarms) for at least 48 hours prior to first CHG shower. It is OK to shave your face.  Please follow these instructions carefully.   1. Shower the NIGHT BEFORE SURGERY and the MORNING OF SURGERY  2. If you chose to wash your hair, wash your hair first as usual with your normal shampoo.  3. After you shampoo, rinse your hair and body thoroughly to remove the shampoo.  4. Wash Face and genitals (private parts) with your normal soap.   5.  Shower the NIGHT BEFORE SURGERY and the MORNING OF SURGERY with CHG Soap.   6. Use CHG Soap as you would any other liquid soap. You can apply CHG directly to the skin and wash gently with a scrungie or a clean washcloth.   7. Apply the CHG Soap to your body ONLY FROM THE NECK DOWN.  Do not use on open wounds or open sores. Avoid contact with your eyes, ears, mouth and genitals (private parts). Wash Face and genitals (private parts)  with your normal soap.   8. Wash thoroughly, paying special attention to the area where your surgery will be performed.  9. Thoroughly rinse your body with warm water from the neck down.  10. DO NOT shower/wash with your normal soap after using and rinsing off the CHG Soap.  11. Pat yourself dry with a CLEAN TOWEL.  12. Wear CLEAN PAJAMAS to bed the night before surgery  13. Place  CLEAN SHEETS on your bed the night before your surgery  14. DO NOT SLEEP WITH PETS.   Day of Surgery: Take a shower with CHG soap. Wear Clean/Comfortable clothing the morning of surgery Do not apply any deodorants/lotions.   Remember to brush your teeth WITH YOUR REGULAR TOOTHPASTE.   Please read over the following fact sheets that you were given.

## 2020-12-05 ENCOUNTER — Other Ambulatory Visit: Payer: Self-pay

## 2020-12-05 ENCOUNTER — Encounter: Payer: Self-pay | Admitting: Cardiology

## 2020-12-05 ENCOUNTER — Encounter (HOSPITAL_COMMUNITY): Payer: Self-pay | Admitting: Vascular Surgery

## 2020-12-05 ENCOUNTER — Encounter (HOSPITAL_COMMUNITY)
Admission: RE | Admit: 2020-12-05 | Discharge: 2020-12-05 | Disposition: A | Discharge status: Home / Self Care | Payer: Medicaid Other | Source: Ambulatory Visit | Attending: Vascular Surgery | Admitting: Vascular Surgery

## 2020-12-05 ENCOUNTER — Encounter (HOSPITAL_COMMUNITY): Payer: Self-pay

## 2020-12-05 DIAGNOSIS — Z01812 Encounter for preprocedural laboratory examination: Secondary | ICD-10-CM | POA: Insufficient documentation

## 2020-12-05 HISTORY — DX: Depression, unspecified: F32.A

## 2020-12-05 HISTORY — DX: Atherosclerotic heart disease of native coronary artery without angina pectoris: I25.10

## 2020-12-05 LAB — COMPREHENSIVE METABOLIC PANEL
ALT: 56 U/L — ABNORMAL HIGH (ref 0–44)
AST: 76 U/L — ABNORMAL HIGH (ref 15–41)
Albumin: 4.3 g/dL (ref 3.5–5.0)
Alkaline Phosphatase: 102 U/L (ref 38–126)
Anion gap: 11 (ref 5–15)
BUN: 7 mg/dL (ref 6–20)
CO2: 23 mmol/L (ref 22–32)
Calcium: 9.4 mg/dL (ref 8.9–10.3)
Chloride: 101 mmol/L (ref 98–111)
Creatinine, Ser: 0.68 mg/dL (ref 0.44–1.00)
GFR, Estimated: >60 mL/min (ref >60)
Glucose, Bld: 101 mg/dL — ABNORMAL HIGH (ref 70–99)
Potassium: 3.7 mmol/L (ref 3.5–5.1)
Sodium: 135 mmol/L (ref 135–145)
Total Bilirubin: 0.8 mg/dL (ref 0.3–1.2)
Total Protein: 7.6 g/dL (ref 6.5–8.1)

## 2020-12-05 LAB — URINALYSIS, ROUTINE W REFLEX MICROSCOPIC
Bilirubin Urine: NEGATIVE
Glucose, UA: NEGATIVE mg/dL
Ketones, ur: 5 mg/dL — AB
Leukocytes,Ua: NEGATIVE
Nitrite: NEGATIVE
Protein, ur: 30 mg/dL — AB
Specific Gravity, Urine: 1.018 (ref 1.005–1.030)
pH: 5 (ref 5.0–8.0)

## 2020-12-05 LAB — PROTIME-INR
INR: 1 (ref 0.8–1.2)
Prothrombin Time: 13.6 seconds (ref 11.4–15.2)

## 2020-12-05 LAB — CBC
HCT: 44.4 % (ref 36.0–46.0)
Hemoglobin: 15.5 g/dL — ABNORMAL HIGH (ref 12.0–15.0)
MCH: 34.1 pg — ABNORMAL HIGH (ref 26.0–34.0)
MCHC: 34.9 g/dL (ref 30.0–36.0)
MCV: 97.6 fL (ref 80.0–100.0)
Platelets: 224 10*3/uL (ref 150–400)
RBC: 4.55 MIL/uL (ref 3.87–5.11)
RDW: 11.8 % (ref 11.5–15.5)
WBC: 9.6 10*3/uL (ref 4.0–10.5)
nRBC: 0 % (ref 0.0–0.2)

## 2020-12-05 LAB — SURGICAL PCR SCREEN
MRSA, PCR: NEGATIVE
Staphylococcus aureus: NEGATIVE

## 2020-12-05 LAB — TYPE AND SCREEN
ABO/RH(D): B POS
Antibody Screen: NEGATIVE

## 2020-12-05 LAB — APTT: aPTT: 32 seconds (ref 24–36)

## 2020-12-05 NOTE — Telephone Encounter (Signed)
Error

## 2020-12-05 NOTE — Progress Notes (Signed)
PCP - Charlott Rakes MD Cardiologist - Godfrey Pick Tobb,DO  PPM/ICD - denies   Chest x-ray -  EKG -12/01/2020  Stress Test - none ECHO - 12/16/2014 Cardiac Cath - denies  Sleep Study - denies CPAP - none  Fasting Blood Sugar - na Checks Blood Sugar _na____ times a day  Blood Thinner Instructions:continue taking Plavix. Take am of surgery.  Aspirin Instructions: stop Aspirin per preop instructions  ERAS Protcol -na PRE-SURGERY Ensure or G2- na  COVID TEST- scheduled 12/07/2020   Anesthesia review: yes for abnormal EKG  Patient denies shortness of breath, fever, cough and chest pain at PAT appointment   All instructions explained to the patient, with a verbal understanding of the material. Patient agrees to go over the instructions while at home for a better understanding. Patient also instructed to self quarantine after being tested for COVID-19. The opportunity to ask questions was provided.

## 2020-12-07 ENCOUNTER — Other Ambulatory Visit (HOSPITAL_COMMUNITY): Payer: Medicaid Other

## 2020-12-08 ENCOUNTER — Inpatient Hospital Stay (HOSPITAL_COMMUNITY): Admission: RE | Admit: 2020-12-08 | Payer: Medicaid Other | Source: Home / Self Care | Admitting: Vascular Surgery

## 2020-12-08 ENCOUNTER — Encounter (HOSPITAL_COMMUNITY): Admission: RE | Payer: Self-pay | Source: Home / Self Care

## 2020-12-08 SURGERY — ENDARTERECTOMY, CAROTID
Anesthesia: General | Laterality: Right

## 2020-12-09 ENCOUNTER — Telehealth (HOSPITAL_COMMUNITY): Payer: Self-pay | Admitting: *Deleted

## 2020-12-09 NOTE — Telephone Encounter (Signed)
Reaching out to patient to offer assistance regarding upcoming cardiac imaging study; pt verbalizes understanding of appt date/time, parking situation and where to check in, pre-test NPO status and medications ordered, and verified current allergies; name and call back number provided for further questions should they arise  Marlis Oldaker RN Navigator Cardiac Imaging [facility] [phone] office [phone] cell  Pt to take 100mg metoprolol tartrate 2 hours prior to cardiac CT scan. 

## 2020-12-12 ENCOUNTER — Ambulatory Visit (HOSPITAL_COMMUNITY)
Admission: RE | Admit: 2020-12-12 | Discharge: 2020-12-12 | Disposition: A | Discharge status: Home / Self Care | Payer: Medicaid Other | Source: Ambulatory Visit | Attending: Cardiology | Admitting: Cardiology

## 2020-12-12 ENCOUNTER — Encounter (HOSPITAL_COMMUNITY): Payer: Self-pay

## 2020-12-12 ENCOUNTER — Other Ambulatory Visit: Payer: Self-pay

## 2020-12-12 DIAGNOSIS — Z006 Encounter for examination for normal comparison and control in clinical research program: Secondary | ICD-10-CM

## 2020-12-12 DIAGNOSIS — R9431 Abnormal electrocardiogram [ECG] [EKG]: Secondary | ICD-10-CM | POA: Insufficient documentation

## 2020-12-12 DIAGNOSIS — R0789 Other chest pain: Secondary | ICD-10-CM | POA: Diagnosis not present

## 2020-12-12 MED ORDER — NITROGLYCERIN 0.4 MG SL SUBL
0.8000 mg | SUBLINGUAL_TABLET | Freq: Once | SUBLINGUAL | Status: AC
  Administered 2020-12-12: 0.8 mg via SUBLINGUAL

## 2020-12-12 MED ORDER — IOHEXOL 350 MG/ML SOLN
95.0000 mL | Freq: Once | INTRAVENOUS | Status: AC | PRN
  Administered 2020-12-12: 95 mL via INTRAVENOUS

## 2020-12-12 MED ORDER — NITROGLYCERIN 0.4 MG SL SUBL
SUBLINGUAL_TABLET | SUBLINGUAL | Status: AC
  Filled 2020-12-12: qty 2

## 2020-12-12 NOTE — Progress Notes (Signed)
Patient tolerated CT well. Gave water to patient to drink. Vital signs stable encourage to drink water throughout day.Reasons explained and verbalized understanding. Ambulated steady gait.  

## 2020-12-12 NOTE — Research (Signed)
IDENTIFY Informed Consent                  Subject Name: Felicia Perkins    Subject met inclusion and exclusion criteria.  The informed consent form, study requirements and expectations were reviewed with the subject and questions and concerns were addressed prior to the signing of the consent form.  The subject verbalized understanding of the trial requirements.  The subject agreed to participate in the IDENTIFY trial and signed the informed consent at 14:28PM on 12/12/20.  The informed consent was obtained prior to performance of any protocol-specific procedures for the subject.  A copy of the signed informed consent was given to the subject and a copy was placed in the subject's medical record.   Meade Maw , Naval architect

## 2021-01-03 ENCOUNTER — Other Ambulatory Visit: Payer: Self-pay

## 2021-01-03 ENCOUNTER — Ambulatory Visit (HOSPITAL_COMMUNITY): Payer: Medicaid Other | Attending: Internal Medicine

## 2021-01-03 DIAGNOSIS — R0602 Shortness of breath: Secondary | ICD-10-CM | POA: Diagnosis present

## 2021-01-03 LAB — ECHOCARDIOGRAM COMPLETE
Area-P 1/2: 2.6 cm2
S' Lateral: 2.4 cm

## 2021-01-11 ENCOUNTER — Other Ambulatory Visit: Payer: Self-pay

## 2021-01-11 ENCOUNTER — Telehealth: Payer: Self-pay | Admitting: Cardiology

## 2021-01-11 NOTE — Telephone Encounter (Signed)
    Felicia Perkins DOB:  04/21/1976  MRN:  [id number]   Primary Cardiologist: Berniece Salines, DO  Chart reviewed as part of pre-operative protocol coverage. Cardiac CTA 12/14/20 with coronary calcium score of 0.  Echocardiogram 12/2020 LVEF 70-75%, no RWMA, normal diastolic parameters, no significant valvular abnormalities. Given past medical history and time since last visit, based on ACC/AHA guidelines, Felicia Perkins would be at acceptable risk for the planned procedure without further cardiovascular testing.   The patient was advised that if she develops new symptoms prior to surgery to contact our office to arrange for a follow-up visit, and she verbalized understanding.  I will route this recommendation to the requesting party via Epic fax function and remove from pre-op pool.  Please call with questions.  Loel Dubonnet, NP 01/11/2021, 2:46 PM

## 2021-01-11 NOTE — Telephone Encounter (Signed)
   [facility] Pre-operative Risk Assessment    Patient Name: Felicia Perkins  DOB: 12-15-1975  MRN: [id number]   HEARTCARE STAFF: - Please ensure there is not already an duplicate clearance open for this procedure. - Under Visit Info/Reason for Call, type in Other and utilize the format Clearance MM/DD/YY or Clearance TBD. Do not use dashes or single digits. - If request is for dental extraction, please clarify the # of teeth to be extracted. - If the patient is currently at the dentist's office, call Pre-Op APP to address. If the patient is not currently in the dentist office, please route to the Pre-Op pool  Request for surgical clearance:  1. What type of surgery is being performed? A RIGHT CAROTID ARTERY ENDARTERECTOMY   2. When is this surgery scheduled? TBD   3. What type of clearance is required (medical clearance vs. Pharmacy clearance to hold med vs. Both)? CARDIAC   4. Are there any medications that need to be held prior to surgery and how long? NO   5. Practice name and name of physician performing surgery? [facility]    6. What is the office phone number? [phone]   7.   What is the office fax number? [phone]  8.   Anesthesia type (None, local, MAC, general) ? GENERAL    Kamira J Martinique 01/11/2021, 2:11 PM  _________________________________________________________________   (provider comments below)

## 2021-01-18 ENCOUNTER — Encounter (HOSPITAL_COMMUNITY)
Admission: RE | Admit: 2021-01-18 | Discharge: 2021-01-18 | Disposition: A | Discharge status: Home / Self Care | Payer: Medicaid Other | Source: Ambulatory Visit | Attending: Vascular Surgery | Admitting: Vascular Surgery

## 2021-01-18 ENCOUNTER — Other Ambulatory Visit: Payer: Self-pay

## 2021-01-18 ENCOUNTER — Encounter (HOSPITAL_COMMUNITY): Payer: Self-pay

## 2021-01-18 DIAGNOSIS — Z01812 Encounter for preprocedural laboratory examination: Secondary | ICD-10-CM | POA: Insufficient documentation

## 2021-01-18 LAB — CBC
HCT: 42.1 % (ref 36.0–46.0)
Hemoglobin: 14.3 g/dL (ref 12.0–15.0)
MCH: 33.8 pg (ref 26.0–34.0)
MCHC: 34 g/dL (ref 30.0–36.0)
MCV: 99.5 fL (ref 80.0–100.0)
Platelets: 239 10*3/uL (ref 150–400)
RBC: 4.23 MIL/uL (ref 3.87–5.11)
RDW: 11.9 % (ref 11.5–15.5)
WBC: 7.9 10*3/uL (ref 4.0–10.5)
nRBC: 0 % (ref 0.0–0.2)

## 2021-01-18 LAB — URINALYSIS, ROUTINE W REFLEX MICROSCOPIC
Bilirubin Urine: NEGATIVE
Glucose, UA: NEGATIVE mg/dL
Hgb urine dipstick: NEGATIVE
Ketones, ur: NEGATIVE mg/dL
Leukocytes,Ua: NEGATIVE
Nitrite: NEGATIVE
Protein, ur: NEGATIVE mg/dL
Specific Gravity, Urine: 1.004 — ABNORMAL LOW (ref 1.005–1.030)
pH: 7 (ref 5.0–8.0)

## 2021-01-18 LAB — COMPREHENSIVE METABOLIC PANEL
ALT: 71 U/L — ABNORMAL HIGH (ref 0–44)
AST: 81 U/L — ABNORMAL HIGH (ref 15–41)
Albumin: 4.1 g/dL (ref 3.5–5.0)
Alkaline Phosphatase: 114 U/L (ref 38–126)
Anion gap: 9 (ref 5–15)
BUN: <5 mg/dL — ABNORMAL LOW (ref 6–20)
CO2: 29 mmol/L (ref 22–32)
Calcium: 9.3 mg/dL (ref 8.9–10.3)
Chloride: 99 mmol/L (ref 98–111)
Creatinine, Ser: 0.45 mg/dL (ref 0.44–1.00)
GFR, Estimated: >60 mL/min (ref >60)
Glucose, Bld: 87 mg/dL (ref 70–99)
Potassium: 3.7 mmol/L (ref 3.5–5.1)
Sodium: 137 mmol/L (ref 135–145)
Total Bilirubin: 0.3 mg/dL (ref 0.3–1.2)
Total Protein: 7.3 g/dL (ref 6.5–8.1)

## 2021-01-18 LAB — PROTIME-INR
INR: 1 (ref 0.8–1.2)
Prothrombin Time: 12.8 seconds (ref 11.4–15.2)

## 2021-01-18 LAB — APTT: aPTT: 31 seconds (ref 24–36)

## 2021-01-18 LAB — TYPE AND SCREEN
ABO/RH(D): B POS
Antibody Screen: NEGATIVE

## 2021-01-18 LAB — SURGICAL PCR SCREEN
MRSA, PCR: NEGATIVE
Staphylococcus aureus: NEGATIVE

## 2021-01-18 NOTE — Progress Notes (Addendum)
PCP - Charlott Rakes MD Cardiologist - Berniece Salines DO  PPM/ICD - denies Device Orders -  Rep Notified -   Chest x-ray - 08/15/20 EKG - 12/01/20 Stress Test - none ECHO - 01/03/21 Cardiac Cath - none  Sleep Study - none CPAP -   Fasting Blood Sugar - n/a Checks Blood Sugar _____ times a day  Blood Thinner Instructions:continue Plavix per prescribing MD. Take Plavix am of surgery. Aspirin Instructions:n/a  ERAS Protcol -n/a PRE-SURGERY Ensure or G2-   COVID TEST- scheduled 6/13   Anesthesia review: yes  Patient denies shortness of breath, fever, cough and chest pain at PAT appointment   All instructions explained to the patient, with a verbal understanding of the material. Patient agrees to go over the instructions while at home for a better understanding. Patient also instructed to self quarantine after being tested for COVID-19. The opportunity to ask questions was provided.

## 2021-01-18 NOTE — Pre-Procedure Instructions (Signed)
Felicia Perkins  01/18/2021     Your procedure is scheduled on Thurs., January 26, 2021 from 7:30AM-9:43AM  Report to [facility] Entrance "A" at 5:30AM  Call this number if you have problems the morning of surgery:  [phone]   Remember:  Do not eat or drink after midnight on June 15th    Take these medicines the morning of surgery with A SIP OF WATER: Clopidogrel (PLAVIX) Famotidine (PEPCID) Pantoprazole (PROTONIX)  Rosuvastatin (CRESTOR)  If Needed: Albuterol (VENTOLIN HFA) Inhaler-bring with you day of surgery Mometasone-formoterol (DULERA) Inhaler-bring with you day of surgery  7 days before surgery (01/19/21), stop taking all Aspirin (Unless instructed by your doctor) and Other Aspirin containing products, Vitamins, Fish oils, and Herbal medications. Also stop all NSAIDS i.e. Advil, Ibuprofen, Motrin, Aleve, Anaprox, Naproxen, BC, Goody Powders, and all Supplements.   No Smoking of any kind, Tobacco/Vaping, or Alcohol products 24 hours prior to your procedure. If you use a Cpap at night, you may bring all equipment for your overnight stay.    Day of Surgery:  Do not wear jewelry, make-up or nail polish including gel or acrylic nails, they should                be removed or the procedure may be postponed if we can not properly monitor you                  during the procedure.  Do not wear lotions, powders, or perfumes, or deodorant.  Do not shave 48 hours prior to surgery.  Men may shave face and neck.  Do not bring valuables to the hospital.  [facility] is not responsible for any belongings or valuables.  Contacts, dentures or bridgework may not be worn into surgery.    For patients admitted to the hospital, discharge time will be determined by your treatment team.  Patients discharged the day of surgery will not be allowed to drive home, and someone age 4 and over needs to stay with them for 24 hours.   Special instructions:  [place]- Preparing For  Surgery  Before surgery, you can play an important role. Because skin is not sterile, your skin needs to be as free of germs as possible. You can reduce the number of germs on your skin by washing with CHG (chlorahexidine gluconate) Soap before surgery.  CHG is an antiseptic cleaner which kills germs and bonds with the skin to continue killing germs even after washing.    Oral Hygiene is also important to reduce your risk of infection.  Remember - BRUSH YOUR TEETH THE MORNING OF SURGERY WITH YOUR REGULAR TOOTHPASTE  Please do not use if you have an allergy to CHG or antibacterial soaps. If your skin becomes reddened/irritated stop using the CHG.  Do not shave (including legs and underarms) for at least 48 hours prior to first CHG shower. It is OK to shave your face.  Please follow these instructions carefully.   1. Shower the NIGHT BEFORE SURGERY and the MORNING OF SURGERY with CHG.   2. If you chose to wash your hair, wash your hair first as usual with your normal shampoo.  3. After you shampoo, rinse your hair and body thoroughly to remove the shampoo.  4. Use CHG as you would any other liquid soap. You can apply CHG directly to the skin and wash gently with a scrungie or a clean washcloth.   5. Apply the CHG Soap to  your body ONLY FROM THE NECK DOWN.  Do not use on open wounds or open sores. Avoid contact with your eyes, ears, mouth and genitals (private parts). Wash Face and genitals (private parts)  with your normal soap.  6. Wash thoroughly, paying special attention to the area where your surgery will be performed.  7. Thoroughly rinse your body with warm water from the neck down.  8. DO NOT shower/wash with your normal soap after using and rinsing off the CHG Soap.  9. Pat yourself dry with a CLEAN TOWEL.  10. Wear CLEAN PAJAMAS to bed the night before surgery, wear comfortable clothes the morning of surgery  11. Place CLEAN SHEETS on your bed the night of your first shower and  DO NOT SLEEP WITH PETS.  Reminders: Do not apply any deodorants/lotions.  Please wear clean clothes to the hospital/surgery center.   Remember to brush your teeth WITH YOUR REGULAR TOOTHPASTE.  Please read over the following fact sheets that you were given.

## 2021-01-19 ENCOUNTER — Other Ambulatory Visit: Payer: Self-pay | Admitting: Family Medicine

## 2021-01-19 NOTE — Telephone Encounter (Signed)
Requested medications are due for refill today yes  Requested medications are on the active medication list yes  Last refill 3/1  Last visit 08/2020  (video visit)  Future visit scheduled no  Notes to clinic Has already had a curtesy refill and there is no upcoming appointment scheduled.

## 2021-01-20 NOTE — Anesthesia Preprocedure Evaluation (Addendum)
Anesthesia Evaluation  Patient identified by MRN, date of birth, ID band Patient awake    Reviewed: Allergy & Precautions, H&P , NPO status , Patient's Chart, lab work & pertinent test results  Airway Mallampati: I  TM Distance: >3 FB Neck ROM: Full    Dental no notable dental hx. (+) Teeth Intact, Dental Advisory Given, Poor Dentition, Chipped   Pulmonary neg pulmonary ROS, COPD, Current Smoker and Patient abstained from smoking.,    Pulmonary exam normal breath sounds clear to auscultation       Cardiovascular Exercise Tolerance: Good Normal cardiovascular exam Rhythm:Regular Rate:Normal     Neuro/Psych PSYCHIATRIC DISORDERS Anxiety Depression Bipolar Disorder    GI/Hepatic GERD  Medicated and Controlled,(+) Cirrhosis     substance abuse  alcohol use,   Endo/Other    Renal/GU   negative genitourinary   Musculoskeletal   Abdominal   Peds negative pediatric ROS (+)  Hematology   Anesthesia Other Findings   Reproductive/Obstetrics                           Anesthesia Physical Anesthesia Plan  ASA: 3  Anesthesia Plan: General   Post-op Pain Management:    Induction: Intravenous  PONV Risk Score and Plan: 3 and Ondansetron, Dexamethasone and Treatment may vary due to age or medical condition  Airway Management Planned: Oral ETT  Additional Equipment: None  Intra-op Plan:   Post-operative Plan: Extubation in OR  Informed Consent: I have reviewed the patients History and Physical, chart, labs and discussed the procedure including the risks, benefits and alternatives for the proposed anesthesia with the patient or authorized representative who has indicated his/her understanding and acceptance.       Plan Discussed with: Anesthesiologist and CRNA  Anesthesia Plan Comments: (PAT note by Karoline Caldwell, PA-C: Recent cardiology evaluation for report of chest discomfort. Testing was  reassuring. Cardiology clearance per telephone encounter 01/11/21, "Chart reviewed as part of pre-operative protocol coverage.Cardiac CTA 12/14/20 with coronary calcium score of 0. Echocardiogram 12/2020 LVEF 70-75%, no RWMA, normal diastolic parameters, no significant valvular abnormalities. Given past medical history and time since last visit, based on ACC/AHA guidelines,Felicia M Carterwould be at acceptable risk for the planned procedure without further cardiovascular testing. The patient was advised that ifshedevelops new symptoms prior to surgery to contact our office to arrange for a follow-up visit, and sheverbalized understanding."  Preop labs reviewed, mild transaminates which is not new, otherwise unremarkable.   EKG 12/01/20: Sinus rhythm with short PR. Septal infarct, age undetermined. Rate 84.  CHEST - 2 VIEW 08/15/20: COMPARISON: February 28, 2020  FINDINGS: Lungs are clear. Heart size and pulmonary vascularity are normal. No adenopathy. No bone lesions.  IMPRESSION: Lungs clear. Cardiac silhouette normal.  TTE 01/03/21: 1. Left ventricular ejection fraction, by estimation, is 70 to 75%. The  left ventricle has hyperdynamic function. The left ventricle has no  regional wall motion abnormalities. Left ventricular diastolic parameters  were normal.  2. Right ventricular systolic function is normal. The right ventricular  size is normal.  3. The mitral valve is normal in structure. No evidence of mitral valve  regurgitation.  4. The aortic valve is normal in structure. Aortic valve regurgitation is  not visualized.   Comparison(s): The left ventricular function is unchanged.   Coronary CTA 12/12/20: IMPRESSION: 1. Calcium score 0  2. Normal right dominant coronary arteries  3. Normal aortic root 2.9 cm  Carotid duplex 11/18/20: Summary:  Right  Carotid: Velocities in the right ICA are consistent with a 80-99% stenosis.   Left Carotid: Velocities in the left ICA  are consistent with a 40-59% stenosis.   Vertebrals: Right vertebral artery demonstrates antegrade flow. Left vertebral artery was not visualized.    )       Anesthesia Quick Evaluation

## 2021-01-20 NOTE — Progress Notes (Signed)
Anesthesia Chart Review:  Recent cardiology evaluation for report of chest discomfort. Testing was reassuring. Cardiology clearance per telephone encounter 01/11/21, "Chart reviewed as part of pre-operative protocol coverage. Cardiac CTA 12/14/20 with coronary calcium score of 0.  Echocardiogram 12/2020 LVEF 70-75%, no RWMA, normal diastolic parameters, no significant valvular abnormalities. Given past medical history and time since last visit, based on ACC/AHA guidelines, Felicia Perkins would be at acceptable risk for the planned procedure without further cardiovascular testing. The patient was advised that if she develops new symptoms prior to surgery to contact our office to arrange for a follow-up visit, and she verbalized understanding."  Preop labs reviewed, mild transaminates which is not new, otherwise unremarkable.   EKG 12/01/20: Sinus rhythm with short PR. Septal infarct, age undetermined. Rate 84.  CHEST - 2 VIEW 08/15/20: COMPARISON:  February 28, 2020   FINDINGS: Lungs are clear. Heart size and pulmonary vascularity are normal. No adenopathy. No bone lesions.   IMPRESSION: Lungs clear.  Cardiac silhouette normal.  TTE 01/03/21:  1. Left ventricular ejection fraction, by estimation, is 70 to 75%. The  left ventricle has hyperdynamic function. The left ventricle has no  regional wall motion abnormalities. Left ventricular diastolic parameters  were normal.   2. Right ventricular systolic function is normal. The right ventricular  size is normal.   3. The mitral valve is normal in structure. No evidence of mitral valve  regurgitation.   4. The aortic valve is normal in structure. Aortic valve regurgitation is  not visualized.   Comparison(s): The left ventricular function is unchanged.   Coronary CTA 12/12/20: IMPRESSION: 1. Calcium score 0   2.  Normal right dominant coronary arteries   3.  Normal aortic root 2.9 cm  Carotid duplex 11/18/20: Summary:  Right Carotid: Velocities  in the right ICA are consistent with a 80-99% stenosis.   Left Carotid: Velocities in the left ICA are consistent with a 40-59% stenosis.   Vertebrals: Right vertebral artery demonstrates antegrade flow. Left vertebral artery was not visualized.     Felicia Perkins [facility] Short Stay Center/Anesthesiology Phone [phone] 01/20/2021 2:06 PM

## 2021-01-24 ENCOUNTER — Other Ambulatory Visit (HOSPITAL_COMMUNITY)
Admission: RE | Admit: 2021-01-24 | Discharge: 2021-01-24 | Disposition: A | Discharge status: Home / Self Care | Payer: Medicaid Other | Source: Ambulatory Visit | Attending: Vascular Surgery | Admitting: Vascular Surgery

## 2021-01-24 DIAGNOSIS — Z01812 Encounter for preprocedural laboratory examination: Secondary | ICD-10-CM | POA: Diagnosis not present

## 2021-01-24 DIAGNOSIS — Z20822 Contact with and (suspected) exposure to covid-19: Secondary | ICD-10-CM | POA: Diagnosis not present

## 2021-01-24 LAB — SARS CORONAVIRUS 2 (TAT 6-24 HRS): SARS Coronavirus 2: NEGATIVE

## 2021-01-26 ENCOUNTER — Other Ambulatory Visit: Payer: Self-pay

## 2021-01-26 ENCOUNTER — Inpatient Hospital Stay (HOSPITAL_COMMUNITY)
Admission: RE | Admit: 2021-01-26 | Discharge: 2021-01-27 | DRG: 038 | Disposition: A | Discharge status: Home / Self Care | Payer: Medicaid Other | Attending: Vascular Surgery | Admitting: Vascular Surgery

## 2021-01-26 ENCOUNTER — Encounter (HOSPITAL_COMMUNITY)
Admission: RE | Disposition: A | Discharge status: Home / Self Care | Payer: Self-pay | Source: Home / Self Care | Attending: Vascular Surgery

## 2021-01-26 ENCOUNTER — Inpatient Hospital Stay (HOSPITAL_COMMUNITY): Payer: Medicaid Other | Admitting: Physician Assistant

## 2021-01-26 ENCOUNTER — Encounter (HOSPITAL_COMMUNITY): Payer: Self-pay | Admitting: Vascular Surgery

## 2021-01-26 ENCOUNTER — Inpatient Hospital Stay (HOSPITAL_COMMUNITY): Payer: Medicaid Other | Admitting: Anesthesiology

## 2021-01-26 DIAGNOSIS — Z803 Family history of malignant neoplasm of breast: Secondary | ICD-10-CM

## 2021-01-26 DIAGNOSIS — K7030 Alcoholic cirrhosis of liver without ascites: Secondary | ICD-10-CM | POA: Diagnosis present

## 2021-01-26 DIAGNOSIS — Z20822 Contact with and (suspected) exposure to covid-19: Secondary | ICD-10-CM | POA: Diagnosis present

## 2021-01-26 DIAGNOSIS — I6529 Occlusion and stenosis of unspecified carotid artery: Secondary | ICD-10-CM | POA: Diagnosis present

## 2021-01-26 DIAGNOSIS — F319 Bipolar disorder, unspecified: Secondary | ICD-10-CM | POA: Diagnosis present

## 2021-01-26 DIAGNOSIS — Z79899 Other long term (current) drug therapy: Secondary | ICD-10-CM | POA: Diagnosis not present

## 2021-01-26 DIAGNOSIS — F419 Anxiety disorder, unspecified: Secondary | ICD-10-CM | POA: Diagnosis present

## 2021-01-26 DIAGNOSIS — R0989 Other specified symptoms and signs involving the circulatory and respiratory systems: Secondary | ICD-10-CM | POA: Diagnosis present

## 2021-01-26 DIAGNOSIS — J449 Chronic obstructive pulmonary disease, unspecified: Secondary | ICD-10-CM | POA: Diagnosis present

## 2021-01-26 DIAGNOSIS — K2990 Gastroduodenitis, unspecified, without bleeding: Secondary | ICD-10-CM | POA: Diagnosis present

## 2021-01-26 DIAGNOSIS — K2970 Gastritis, unspecified, without bleeding: Secondary | ICD-10-CM | POA: Diagnosis present

## 2021-01-26 DIAGNOSIS — F4310 Post-traumatic stress disorder, unspecified: Secondary | ICD-10-CM | POA: Diagnosis present

## 2021-01-26 DIAGNOSIS — F17210 Nicotine dependence, cigarettes, uncomplicated: Secondary | ICD-10-CM | POA: Diagnosis present

## 2021-01-26 DIAGNOSIS — K219 Gastro-esophageal reflux disease without esophagitis: Secondary | ICD-10-CM | POA: Diagnosis present

## 2021-01-26 DIAGNOSIS — K860 Alcohol-induced chronic pancreatitis: Secondary | ICD-10-CM | POA: Diagnosis present

## 2021-01-26 DIAGNOSIS — F1020 Alcohol dependence, uncomplicated: Secondary | ICD-10-CM | POA: Diagnosis present

## 2021-01-26 DIAGNOSIS — Z8249 Family history of ischemic heart disease and other diseases of the circulatory system: Secondary | ICD-10-CM | POA: Diagnosis not present

## 2021-01-26 DIAGNOSIS — I6521 Occlusion and stenosis of right carotid artery: Principal | ICD-10-CM | POA: Diagnosis present

## 2021-01-26 HISTORY — PX: ENDARTERECTOMY: SHX5162

## 2021-01-26 LAB — POCT ACTIVATED CLOTTING TIME: Activated Clotting Time: 271 seconds

## 2021-01-26 SURGERY — ENDARTERECTOMY, CAROTID
Anesthesia: General | Laterality: Right

## 2021-01-26 MED ORDER — SODIUM CHLORIDE 0.9 % IV SOLN: INTRAVENOUS | Status: DC

## 2021-01-26 MED ORDER — ACETAMINOPHEN 160 MG/5ML PO SOLN: 325.0000 mg | ORAL | Status: DC | PRN

## 2021-01-26 MED ORDER — PHENYLEPHRINE HCL-NACL 10-0.9 MG/250ML-% IV SOLN
INTRAVENOUS | Status: DC | PRN
  Administered 2021-01-26: 50 ug/min via INTRAVENOUS

## 2021-01-26 MED ORDER — HEPARIN SODIUM (PORCINE) 1000 UNIT/ML IJ SOLN
INTRAMUSCULAR | Status: DC | PRN
  Administered 2021-01-26: 5000 [IU] via INTRAVENOUS

## 2021-01-26 MED ORDER — ESMOLOL HCL 100 MG/10ML IV SOLN
INTRAVENOUS | Status: AC
  Filled 2021-01-26: qty 10

## 2021-01-26 MED ORDER — LIDOCAINE 2% (20 MG/ML) 5 ML SYRINGE
INTRAMUSCULAR | Status: DC | PRN
  Administered 2021-01-26: 100 mg via INTRAVENOUS

## 2021-01-26 MED ORDER — GUAIFENESIN-DM 100-10 MG/5ML PO SYRP: 15.0000 mL | ORAL_SOLUTION | ORAL | Status: DC | PRN

## 2021-01-26 MED ORDER — PROPOFOL 10 MG/ML IV BOLUS
INTRAVENOUS | Status: AC
  Filled 2021-01-26: qty 20

## 2021-01-26 MED ORDER — ACETAMINOPHEN 650 MG RE SUPP
325.0000 mg | RECTAL | Status: DC | PRN
Start: 2021-01-26 — End: 2021-01-27

## 2021-01-26 MED ORDER — FENTANYL CITRATE (PF) 250 MCG/5ML IJ SOLN
INTRAMUSCULAR | Status: DC | PRN
  Administered 2021-01-26: 50 ug via INTRAVENOUS
  Administered 2021-01-26: 100 ug via INTRAVENOUS

## 2021-01-26 MED ORDER — SODIUM CHLORIDE 0.9 % IV SOLN
INTRAVENOUS | Status: DC | PRN
  Administered 2021-01-26: 500 mL

## 2021-01-26 MED ORDER — MEPERIDINE HCL 25 MG/ML IJ SOLN
6.2500 mg | INTRAMUSCULAR | Status: DC | PRN
Start: 2021-01-26 — End: 2021-01-26

## 2021-01-26 MED ORDER — LACTATED RINGERS IV SOLN: INTRAVENOUS | Status: DC

## 2021-01-26 MED ORDER — ROSUVASTATIN CALCIUM 5 MG PO TABS
10.0000 mg | ORAL_TABLET | Freq: Every day | ORAL | Status: DC
  Administered 2021-01-26 – 2021-01-27 (×2): 10 mg via ORAL
  Filled 2021-01-26 (×2): qty 2

## 2021-01-26 MED ORDER — CHLORHEXIDINE GLUCONATE CLOTH 2 % EX PADS: 6.0000 | MEDICATED_PAD | Freq: Once | CUTANEOUS | Status: DC

## 2021-01-26 MED ORDER — ONDANSETRON HCL 4 MG/2ML IJ SOLN
INTRAMUSCULAR | Status: DC | PRN
  Administered 2021-01-26: 4 mg via INTRAVENOUS

## 2021-01-26 MED ORDER — OXYCODONE HCL 5 MG PO TABS
5.0000 mg | ORAL_TABLET | Freq: Once | ORAL | Status: AC | PRN
  Administered 2021-01-26: 5 mg via ORAL

## 2021-01-26 MED ORDER — ALBUTEROL SULFATE (2.5 MG/3ML) 0.083% IN NEBU: 3.0000 mL | INHALATION_SOLUTION | Freq: Four times a day (QID) | RESPIRATORY_TRACT | Status: DC | PRN

## 2021-01-26 MED ORDER — SODIUM CHLORIDE 0.9 % IV SOLN: INTRAVENOUS | Status: AC

## 2021-01-26 MED ORDER — ROCURONIUM BROMIDE 10 MG/ML (PF) SYRINGE
PREFILLED_SYRINGE | INTRAVENOUS | Status: DC | PRN
  Administered 2021-01-26: 100 mg via INTRAVENOUS

## 2021-01-26 MED ORDER — EPHEDRINE 5 MG/ML INJ
INTRAVENOUS | Status: AC
  Filled 2021-01-26: qty 10

## 2021-01-26 MED ORDER — PANTOPRAZOLE SODIUM 40 MG PO TBEC
40.0000 mg | DELAYED_RELEASE_TABLET | Freq: Two times a day (BID) | ORAL | Status: DC
  Administered 2021-01-26 – 2021-01-27 (×2): 40 mg via ORAL
  Filled 2021-01-26 (×2): qty 1

## 2021-01-26 MED ORDER — PANTOPRAZOLE SODIUM 40 MG PO TBEC: 40.0000 mg | DELAYED_RELEASE_TABLET | Freq: Every day | ORAL | Status: DC

## 2021-01-26 MED ORDER — OXYCODONE HCL 5 MG/5ML PO SOLN: 5.0000 mg | Freq: Once | ORAL | Status: AC | PRN

## 2021-01-26 MED ORDER — ACETAMINOPHEN 325 MG PO TABS: 325.0000 mg | ORAL_TABLET | ORAL | Status: DC | PRN

## 2021-01-26 MED ORDER — POTASSIUM CHLORIDE CRYS ER 20 MEQ PO TBCR
20.0000 meq | EXTENDED_RELEASE_TABLET | Freq: Every day | ORAL | Status: DC | PRN
Start: 2021-01-26 — End: 2021-01-27

## 2021-01-26 MED ORDER — METOPROLOL TARTRATE 5 MG/5ML IV SOLN: 2.0000 mg | INTRAVENOUS | Status: DC | PRN

## 2021-01-26 MED ORDER — LABETALOL HCL 5 MG/ML IV SOLN: 10.0000 mg | INTRAVENOUS | Status: DC | PRN

## 2021-01-26 MED ORDER — SODIUM CHLORIDE 0.9 % IV SOLN
0.0125 ug/kg/min | Freq: Once | INTRAVENOUS | Status: AC
  Administered 2021-01-26: .1 ug/kg/min via INTRAVENOUS
  Filled 2021-01-26: qty 2000

## 2021-01-26 MED ORDER — SODIUM CHLORIDE 0.9 % IV SOLN
INTRAVENOUS | Status: AC
  Filled 2021-01-26: qty 1.2

## 2021-01-26 MED ORDER — HYDRALAZINE HCL 20 MG/ML IJ SOLN: 5.0000 mg | INTRAMUSCULAR | Status: DC | PRN

## 2021-01-26 MED ORDER — HEMOSTATIC AGENTS (NO CHARGE) OPTIME
TOPICAL | Status: DC | PRN
  Administered 2021-01-26: 1 via TOPICAL

## 2021-01-26 MED ORDER — CHLORHEXIDINE GLUCONATE 0.12 % MT SOLN
15.0000 mL | Freq: Once | OROMUCOSAL | Status: AC
  Administered 2021-01-26: 15 mL via OROMUCOSAL
  Filled 2021-01-26: qty 15

## 2021-01-26 MED ORDER — FENTANYL CITRATE (PF) 250 MCG/5ML IJ SOLN
INTRAMUSCULAR | Status: AC
  Filled 2021-01-26: qty 5

## 2021-01-26 MED ORDER — ESMOLOL HCL 100 MG/10ML IV SOLN
INTRAVENOUS | Status: DC | PRN
  Administered 2021-01-26: 10 mg via INTRAVENOUS

## 2021-01-26 MED ORDER — LIDOCAINE HCL (PF) 1 % IJ SOLN
INTRAMUSCULAR | Status: AC
  Filled 2021-01-26: qty 5

## 2021-01-26 MED ORDER — PROPOFOL 10 MG/ML IV BOLUS
INTRAVENOUS | Status: DC | PRN
  Administered 2021-01-26 (×2): 50 mg via INTRAVENOUS
  Administered 2021-01-26: 100 mg via INTRAVENOUS

## 2021-01-26 MED ORDER — FENTANYL CITRATE (PF) 100 MCG/2ML IJ SOLN
25.0000 ug | INTRAMUSCULAR | Status: DC | PRN
  Administered 2021-01-26 (×2): 50 ug via INTRAVENOUS

## 2021-01-26 MED ORDER — PHENYLEPHRINE 40 MCG/ML (10ML) SYRINGE FOR IV PUSH (FOR BLOOD PRESSURE SUPPORT)
PREFILLED_SYRINGE | INTRAVENOUS | Status: AC
  Filled 2021-01-26: qty 10

## 2021-01-26 MED ORDER — CEFAZOLIN SODIUM-DEXTROSE 2-4 GM/100ML-% IV SOLN
2.0000 g | Freq: Three times a day (TID) | INTRAVENOUS | Status: AC
  Administered 2021-01-26 (×2): 2 g via INTRAVENOUS
  Filled 2021-01-26 (×2): qty 100

## 2021-01-26 MED ORDER — EPHEDRINE SULFATE-NACL 50-0.9 MG/10ML-% IV SOSY
PREFILLED_SYRINGE | INTRAVENOUS | Status: DC | PRN
  Administered 2021-01-26: 5 mg via INTRAVENOUS

## 2021-01-26 MED ORDER — ONDANSETRON HCL 4 MG/2ML IJ SOLN: 4.0000 mg | Freq: Four times a day (QID) | INTRAMUSCULAR | Status: DC | PRN

## 2021-01-26 MED ORDER — CEFAZOLIN SODIUM-DEXTROSE 2-4 GM/100ML-% IV SOLN
2.0000 g | INTRAVENOUS | Status: AC
  Administered 2021-01-26: 2 g via INTRAVENOUS
  Filled 2021-01-26: qty 100

## 2021-01-26 MED ORDER — MIDAZOLAM HCL 2 MG/2ML IJ SOLN
INTRAMUSCULAR | Status: AC
  Filled 2021-01-26: qty 2

## 2021-01-26 MED ORDER — 0.9 % SODIUM CHLORIDE (POUR BTL) OPTIME
TOPICAL | Status: DC | PRN
  Administered 2021-01-26: 2000 mL

## 2021-01-26 MED ORDER — MORPHINE SULFATE (PF) 2 MG/ML IV SOLN: 2.0000 mg | INTRAVENOUS | Status: DC | PRN

## 2021-01-26 MED ORDER — LIDOCAINE HCL (PF) 2 % IJ SOLN
INTRAMUSCULAR | Status: AC
  Filled 2021-01-26: qty 5

## 2021-01-26 MED ORDER — OXYCODONE HCL 5 MG PO TABS
ORAL_TABLET | ORAL | Status: AC
  Filled 2021-01-26: qty 1

## 2021-01-26 MED ORDER — ONDANSETRON HCL 4 MG/2ML IJ SOLN
INTRAMUSCULAR | Status: AC
  Filled 2021-01-26: qty 2

## 2021-01-26 MED ORDER — OXYCODONE-ACETAMINOPHEN 5-325 MG PO TABS
1.0000 | ORAL_TABLET | ORAL | Status: DC | PRN
  Administered 2021-01-26 – 2021-01-27 (×5): 2 via ORAL
  Filled 2021-01-26 (×5): qty 2

## 2021-01-26 MED ORDER — ALUM & MAG HYDROXIDE-SIMETH 200-200-20 MG/5ML PO SUSP: 15.0000 mL | ORAL | Status: DC | PRN

## 2021-01-26 MED ORDER — FENTANYL CITRATE (PF) 100 MCG/2ML IJ SOLN
INTRAMUSCULAR | Status: AC
  Filled 2021-01-26: qty 2

## 2021-01-26 MED ORDER — DOCUSATE SODIUM 100 MG PO CAPS
100.0000 mg | ORAL_CAPSULE | Freq: Every day | ORAL | Status: DC
  Administered 2021-01-27: 100 mg via ORAL
  Filled 2021-01-26: qty 1

## 2021-01-26 MED ORDER — SUGAMMADEX SODIUM 200 MG/2ML IV SOLN
INTRAVENOUS | Status: DC | PRN
  Administered 2021-01-26: 200 mg via INTRAVENOUS

## 2021-01-26 MED ORDER — POLYETHYLENE GLYCOL 3350 17 G PO PACK: 17.0000 g | PACK | Freq: Every day | ORAL | Status: DC | PRN

## 2021-01-26 MED ORDER — PHENYLEPHRINE 40 MCG/ML (10ML) SYRINGE FOR IV PUSH (FOR BLOOD PRESSURE SUPPORT)
PREFILLED_SYRINGE | INTRAVENOUS | Status: DC | PRN
  Administered 2021-01-26: 160 ug via INTRAVENOUS
  Administered 2021-01-26: 80 ug via INTRAVENOUS

## 2021-01-26 MED ORDER — DEXAMETHASONE SODIUM PHOSPHATE 10 MG/ML IJ SOLN
INTRAMUSCULAR | Status: AC
  Filled 2021-01-26: qty 1

## 2021-01-26 MED ORDER — METOPROLOL TARTRATE 12.5 MG HALF TABLET
12.5000 mg | ORAL_TABLET | Freq: Two times a day (BID) | ORAL | Status: DC
  Administered 2021-01-26 – 2021-01-27 (×3): 12.5 mg via ORAL
  Filled 2021-01-26 (×3): qty 1

## 2021-01-26 MED ORDER — PHENOL 1.4 % MT LIQD: 1.0000 | OROMUCOSAL | Status: DC | PRN

## 2021-01-26 MED ORDER — MAGNESIUM SULFATE 2 GM/50ML IV SOLN: 2.0000 g | Freq: Every day | INTRAVENOUS | Status: DC | PRN

## 2021-01-26 MED ORDER — PROTAMINE SULFATE 10 MG/ML IV SOLN
INTRAVENOUS | Status: DC | PRN
  Administered 2021-01-26: 50 mg via INTRAVENOUS

## 2021-01-26 MED ORDER — ROCURONIUM BROMIDE 10 MG/ML (PF) SYRINGE
PREFILLED_SYRINGE | INTRAVENOUS | Status: AC
  Filled 2021-01-26: qty 10

## 2021-01-26 MED ORDER — ORAL CARE MOUTH RINSE: 15.0000 mL | Freq: Once | OROMUCOSAL | Status: AC

## 2021-01-26 MED ORDER — CLOPIDOGREL BISULFATE 75 MG PO TABS
75.0000 mg | ORAL_TABLET | Freq: Every day | ORAL | Status: DC
  Administered 2021-01-27: 75 mg via ORAL
  Filled 2021-01-26: qty 1

## 2021-01-26 MED ORDER — ONDANSETRON HCL 4 MG/2ML IJ SOLN: 4.0000 mg | Freq: Once | INTRAMUSCULAR | Status: DC | PRN

## 2021-01-26 MED ORDER — SODIUM CHLORIDE 0.9 % IV SOLN: 500.0000 mL | Freq: Once | INTRAVENOUS | Status: DC | PRN

## 2021-01-26 MED ORDER — DEXAMETHASONE SODIUM PHOSPHATE 10 MG/ML IJ SOLN
INTRAMUSCULAR | Status: DC | PRN
  Administered 2021-01-26: 10 mg via INTRAVENOUS

## 2021-01-26 MED ORDER — MIDAZOLAM HCL 2 MG/2ML IJ SOLN
INTRAMUSCULAR | Status: DC | PRN
  Administered 2021-01-26: 2 mg via INTRAVENOUS

## 2021-01-26 MED ORDER — FAMOTIDINE 20 MG PO TABS
20.0000 mg | ORAL_TABLET | Freq: Two times a day (BID) | ORAL | Status: DC
  Administered 2021-01-26 – 2021-01-27 (×3): 20 mg via ORAL
  Filled 2021-01-26 (×3): qty 1

## 2021-01-26 MED ORDER — LABETALOL HCL 5 MG/ML IV SOLN
INTRAVENOUS | Status: DC | PRN
  Administered 2021-01-26 (×2): 10 mg via INTRAVENOUS

## 2021-01-26 MED ORDER — NITROGLYCERIN 0.4 MG SL SUBL: 0.4000 mg | SUBLINGUAL_TABLET | SUBLINGUAL | Status: DC | PRN

## 2021-01-26 SURGICAL SUPPLY — 51 items
ADH SKN CLS APL DERMABOND .7 (GAUZE/BANDAGES/DRESSINGS) ×1
BAG DECANTER FOR FLEXI CONT (MISCELLANEOUS) ×2 IMPLANT
CANISTER SUCT 3000ML PPV (MISCELLANEOUS) ×2 IMPLANT
CATH ROBINSON RED A/P 18FR (CATHETERS) ×2 IMPLANT
CLIP LIGATING EXTRA MED SLVR (CLIP) ×2 IMPLANT
CLIP LIGATING EXTRA SM BLUE (MISCELLANEOUS) ×2 IMPLANT
CLIP VESOCCLUDE SM WIDE 24/CT (CLIP) ×2 IMPLANT
COVER PROBE W GEL 5X96 (DRAPES) ×2 IMPLANT
COVER WAND RF STERILE (DRAPES) ×2 IMPLANT
DERMABOND ADVANCED (GAUZE/BANDAGES/DRESSINGS) ×1
DERMABOND ADVANCED .7 DNX12 (GAUZE/BANDAGES/DRESSINGS) ×1 IMPLANT
DRAIN CHANNEL 15F RND FF W/TCR (WOUND CARE) IMPLANT
ELECT REM PT RETURN 9FT ADLT (ELECTROSURGICAL) ×2
ELECTRODE REM PT RTRN 9FT ADLT (ELECTROSURGICAL) ×1 IMPLANT
EVACUATOR SILICONE 100CC (DRAIN) IMPLANT
GLOVE BIO SURGEON STRL SZ7.5 (GLOVE) ×2 IMPLANT
GLOVE SURG UNDER POLY LF SZ6.5 (GLOVE) ×1 IMPLANT
GLOVE SURG UNDER POLY LF SZ7 (GLOVE) ×1 IMPLANT
GOWN STRL REUS W/ TWL LRG LVL3 (GOWN DISPOSABLE) ×2 IMPLANT
GOWN STRL REUS W/ TWL XL LVL3 (GOWN DISPOSABLE) ×1 IMPLANT
GOWN STRL REUS W/TWL LRG LVL3 (GOWN DISPOSABLE) ×4
GOWN STRL REUS W/TWL XL LVL3 (GOWN DISPOSABLE) ×2
HEMOSTAT SNOW SURGICEL 2X4 (HEMOSTASIS) IMPLANT
INSERT FOGARTY SM (MISCELLANEOUS) IMPLANT
KIT BASIN OR (CUSTOM PROCEDURE TRAY) ×2 IMPLANT
KIT SHUNT ARGYLE CAROTID ART 6 (VASCULAR PRODUCTS) ×2 IMPLANT
KIT TURNOVER KIT B (KITS) ×2 IMPLANT
LOOP VESSEL MAXI BLUE (MISCELLANEOUS) ×1 IMPLANT
NDL HYPO 25GX1X1/2 BEV (NEEDLE) IMPLANT
NDL SPNL 20GX3.5 QUINCKE YW (NEEDLE) IMPLANT
NEEDLE HYPO 25GX1X1/2 BEV (NEEDLE) IMPLANT
NEEDLE SPNL 20GX3.5 QUINCKE YW (NEEDLE) IMPLANT
NS IRRIG 1000ML POUR BTL (IV SOLUTION) ×6 IMPLANT
PACK CAROTID (CUSTOM PROCEDURE TRAY) ×2 IMPLANT
PAD ARMBOARD 7.5X6 YLW CONV (MISCELLANEOUS) ×4 IMPLANT
PATCH VASC XENOSURE 1CMX6CM (Vascular Products) ×2 IMPLANT
PATCH VASC XENOSURE 1X6 (Vascular Products) IMPLANT
POSITIONER HEAD DONUT 9IN (MISCELLANEOUS) ×2 IMPLANT
POWDER SURGICEL 3.0 GRAM (HEMOSTASIS) ×1 IMPLANT
STOPCOCK 4 WAY LG BORE MALE ST (IV SETS) IMPLANT
SUT ETHILON 3 0 PS 1 (SUTURE) IMPLANT
SUT MNCRL AB 4-0 PS2 18 (SUTURE) ×2 IMPLANT
SUT PROLENE 6 0 BV (SUTURE) ×2 IMPLANT
SUT SILK 3 0 (SUTURE)
SUT SILK 3-0 18XBRD TIE 12 (SUTURE) IMPLANT
SUT VIC AB 3-0 SH 27 (SUTURE) ×2
SUT VIC AB 3-0 SH 27X BRD (SUTURE) ×1 IMPLANT
SYR CONTROL 10ML LL (SYRINGE) IMPLANT
TOWEL GREEN STERILE (TOWEL DISPOSABLE) ×2 IMPLANT
TUBING ART PRESS 48 MALE/FEM (TUBING) IMPLANT
WATER STERILE IRR 1000ML POUR (IV SOLUTION) ×2 IMPLANT

## 2021-01-26 NOTE — Anesthesia Procedure Notes (Signed)
Procedure Name: Intubation Date/Time: 01/26/2021 7:40 AM Performed by: Lance Coon, CRNA Pre-anesthesia Checklist: Patient identified, Emergency Drugs available, Suction available, Patient being monitored and Timeout performed Patient Re-evaluated:Patient Re-evaluated prior to induction Oxygen Delivery Method: Circle system utilized Preoxygenation: Pre-oxygenation with 100% oxygen Induction Type: IV induction Ventilation: Mask ventilation without difficulty Laryngoscope Size: Miller and 3 Grade View: Grade I Tube type: Oral Tube size: 7.0 mm Number of attempts: 1 Airway Equipment and Method: Stylet Placement Confirmation: ETT inserted through vocal cords under direct vision, positive ETCO2 and breath sounds checked- equal and bilateral Secured at: 21 cm Tube secured with: Tape Dental Injury: Teeth and Oropharynx as per pre-operative assessment
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
## 2021-01-26 NOTE — Transfer of Care (Signed)
Immediate Anesthesia Transfer of Care Note  Patient: Felicia Perkins  Procedure(s) Performed: RIGHT CAROTID ARTERY ENDARTERECTOMY (Right)  Patient Location: PACU  Anesthesia Type:General  Level of Consciousness: drowsy and patient cooperative  Airway & Oxygen Therapy: Patient Spontanous Breathing  Post-op Assessment: Report given to RN and Post -op Vital signs reviewed and stable  Post vital signs: Reviewed and stable  Last Vitals:  Vitals Value Taken Time  BP 164/101 01/26/21 0934  Temp    Pulse 71 01/26/21 0939  Resp 13 01/26/21 0939  SpO2 100 % 01/26/21 0939  Vitals shown include unvalidated device data.  Last Pain:  Vitals:   01/26/21 0618  TempSrc:   PainSc: 0-No pain      Patients Stated Pain Goal: 2 (79/15/05 6979)  Complications: No notable events documented.

## 2021-01-26 NOTE — Discharge Instructions (Signed)
   [facility]  Discharge Instructions   Carotid Endarterectomy (CEA)  Please refer to the following instructions for your post-procedure care. Your surgeon or physician assistant will discuss any changes with you.  Activity  You are encouraged to walk as much as you can. You can slowly return to normal activities but must avoid strenuous activity and heavy lifting until your doctor tell you it's OK. Avoid activities such as vacuuming or swinging a golf club. You can drive after one week if you are comfortable and you are no longer taking prescription pain medications. It is normal to feel tired for serval weeks after your surgery. It is also normal to have difficulty with sleep habits, eating, and bowel movements after surgery. These will go away with time.  Bathing/Showering  You may shower after you come home. Do not soak in a bathtub, hot tub, or swim until the incision heals completely.  Incision Care  Shower every day. Clean your incision with mild soap and water. Pat the area dry with a clean towel. You do not need a bandage unless otherwise instructed. Do not apply any ointments or creams to your incision. You may have skin glue on your incision. Do not peel it off. It will come off on its own in about one week. Your incision may feel thickened and raised for several weeks after your surgery. This is normal and the skin will soften over time. For Men Only: It's OK to shave around the incision but do not shave the incision itself for 2 weeks. It is common to have numbness under your chin that could last for several months.  Diet  Resume your normal diet. There are no special food restrictions following this procedure. A low fat/low cholesterol diet is recommended for all patients with vascular disease. In order to heal from your surgery, it is CRITICAL to get adequate nutrition. Your body requires vitamins, minerals, and protein. Vegetables are the best  source of vitamins and minerals. Vegetables also provide the perfect balance of protein. Processed food has little nutritional value, so try to avoid this.        Medications  Resume taking all of your medications unless your doctor or physician assistant tells you not to. If your incision is causing pain, you may take over-the- counter pain relievers such as acetaminophen (Tylenol). If you were prescribed a stronger pain medication, please be aware these medications can cause nausea and constipation. Prevent nausea by taking the medication with a snack or meal. Avoid constipation by drinking plenty of fluids and eating foods with a high amount of fiber, such as fruits, vegetables, and grains. Do not take Tylenol if you are taking prescription pain medications.  Follow Up  Our office will schedule a follow up appointment 2-3 weeks following discharge.  Please call us immediately for any of the following conditions  Increased pain, redness, drainage (pus) from your incision site. Fever of 101 degrees or higher. If you should develop stroke (slurred speech, difficulty swallowing, weakness on one side of your body, loss of vision) you should call 911 and go to the nearest emergency room.  Reduce your risk of vascular disease:  Stop smoking. If you would like help call [facility] at 1-800-QUIT-NOW (1-800-784-8669) or [facility] at 336-586-4000. Manage your cholesterol Maintain a desired weight Control your diabetes Keep your blood pressure down  If you have any questions, please call the office at [phone].   

## 2021-01-26 NOTE — Anesthesia Procedure Notes (Signed)
Arterial Line Insertion Start/End6/16/2022 7:05 AM, 01/26/2021 7:20 AM Performed by: Lance Coon, CRNA, CRNA  Patient location: Pre-op. Preanesthetic checklist: patient identified, IV checked, site marked, risks and benefits discussed, surgical consent, monitors and equipment checked, pre-op evaluation, timeout performed and anesthesia consent Lidocaine 1% used for infiltration Right, radial was placed Catheter size: 20 G Hand hygiene performed , maximum sterile barriers used  and Seldinger technique used  Attempts: 2 Procedure performed without using ultrasound guided technique. Following insertion, dressing applied and Biopatch. Post procedure assessment: normal and unchanged  Patient tolerated the procedure well with no immediate complications.

## 2021-01-26 NOTE — Anesthesia Postprocedure Evaluation (Signed)
Anesthesia Post Note  Patient: Felicia Perkins  Procedure(s) Performed: RIGHT CAROTID ARTERY ENDARTERECTOMY (Right)     Patient location during evaluation: PACU Anesthesia Type: General Level of consciousness: awake and alert Pain management: pain level controlled Vital Signs Assessment: post-procedure vital signs reviewed and stable Respiratory status: spontaneous breathing, nonlabored ventilation, respiratory function stable and patient connected to nasal cannula oxygen Cardiovascular status: blood pressure returned to baseline and stable Postop Assessment: no apparent nausea or vomiting Anesthetic complications: no   No notable events documented.  Last Vitals:  Vitals:   01/26/21 1304 01/26/21 1704  BP: 129/90 (!) 147/97  Pulse: 68 70  Resp: 18 16  Temp: 36.8 C 36.6 C  SpO2: 99% 100%    Last Pain:  Vitals:   01/26/21 1737  TempSrc:   PainSc: 4                  Jeena Arnett

## 2021-01-26 NOTE — Op Note (Signed)
Patient name: Felicia Perkins MRN: [id number] DOB: Nov 25, 1975 Sex: female  01/26/2021 Pre-operative Diagnosis: asymptomatic Right ICA stenosis Post-operative diagnosis:  Same Surgeon:  Erlene Quan C. Donzetta Matters, MD Assistant: Arlee Muslim, PA Procedure Performed:  Right carotid endarterectomy with bovine pericardial patch angioplasty  Indications: 45 year old female found incidentally to have right ICA stenosis that has been asymptomatic.  She has been started on Plavix and Crestor.  She is now indicated for right carotid endarterectomy.  Assistant was necessary for suction, retraction and to expedite the case.  Findings: There was a very tight stenosis just at the bifurcation of the external and internal carotid arteries.  This was a mixture of soft and calcified plaque.  Approximately 1 cm cephalad to this there was a second stenosis that was heavily calcified.  Beyond this the artery was very normal.  A shunt was placed during endarterectomy.  After patch angioplasty there was low resistance signal in the ICA and ECA.  Both of these vessels demonstrated adequate backbleeding prior to completion of the patch angioplasty.  Patient was neurologically intact upon awakening from anesthesia.   Procedure:  The patient was identified in the holding area and taken to the operating where she was put supine on upper table and general anesthesia was induced.  She was gently prepped draped in the right neck and chest in the usual fashion, antibiotics were administered and a timeout was called.  We began with longitudinal incision along the anterior border of the sternocleidomastoid.  We dissected down and retracted the sternocleidomastoid laterally.  We divided branches of the facial vein between ties.  The hypoglossal nerve was identified.  We placed an umbilical tape around the common carotid artery and the patient was fully heparinized with 5000 units of heparin and ACT returned greater than 270.  We dissected out  the external carotid artery and a vessel loop was placed around this.  The ansa cervicalis was divided between clips.  We dissected up until the ICA appeared normal and a vessel loop was placed around this.  A 10 French shunt was prepared.  The ICA was clamped followed by the common carotid and external carotid arteries.  We opened the vessel longitudinally.  We placed a 10 French shunt allowed to backbleed placed into the common carotid artery.  Flow was confirmed with Doppler.  We proceeded with endarterectomy which had smooth tapering distally.  We performed eversion of the external carotid artery.  Bovine pericardial patch was fashioned and sewn in place with 6-0 Prolene suture.  Prior completion without flushing all directions.  We then thoroughly irrigated with heparinized saline.  The shunt was also removed.  We had very strong backbleeding from our external and internal carotid arteries.  Again the bulb was thoroughly irrigated.  We completed our patch angioplasty.  We removed our clamp from our external followed by common carotid arteries and after several cardiac cycles are internal carotid artery.  Doppler confirmed expected low resistance signals in the internal as well as external carotid arteries.  50 mg of protamine was administered.  We meticulously obtained hemostasis.  We irrigated the wound.  We closed the platysma with Vicryl followed by the skin with Monocryl and Dermabond is placed to the level of the skin.  The patient was awake from anesthesia noted to be neurologically intact and transferred recovery room in stable condition.  All counts were correct at completion.  She tolerated the procedure without any complication   EBL: [id number]  Davine Sweney C. Donzetta Matters,  MD [facility] [place] Office: [phone] Pager: [phone]

## 2021-01-27 ENCOUNTER — Encounter (HOSPITAL_COMMUNITY): Payer: Self-pay | Admitting: Vascular Surgery

## 2021-01-27 LAB — BASIC METABOLIC PANEL
Anion gap: 10 (ref 5–15)
BUN: 4 mg/dL — ABNORMAL LOW (ref 6–20)
CO2: 25 mmol/L (ref 22–32)
Calcium: 9.3 mg/dL (ref 8.9–10.3)
Chloride: 101 mmol/L (ref 98–111)
Creatinine, Ser: 0.54 mg/dL (ref 0.44–1.00)
GFR, Estimated: >60 mL/min (ref >60)
Glucose, Bld: 113 mg/dL — ABNORMAL HIGH (ref 70–99)
Potassium: 4.8 mmol/L (ref 3.5–5.1)
Sodium: 136 mmol/L (ref 135–145)

## 2021-01-27 LAB — CBC
HCT: 38.2 % (ref 36.0–46.0)
Hemoglobin: 12.8 g/dL (ref 12.0–15.0)
MCH: 33.1 pg (ref 26.0–34.0)
MCHC: 33.5 g/dL (ref 30.0–36.0)
MCV: 98.7 fL (ref 80.0–100.0)
Platelets: 228 10*3/uL (ref 150–400)
RBC: 3.87 MIL/uL (ref 3.87–5.11)
RDW: 11.9 % (ref 11.5–15.5)
WBC: 13.7 10*3/uL — ABNORMAL HIGH (ref 4.0–10.5)
nRBC: 0 % (ref 0.0–0.2)

## 2021-01-27 MED ORDER — NICOTINE 14 MG/24HR TD PT24: 14.0000 mg | MEDICATED_PATCH | TRANSDERMAL | 2 refills | Status: DC

## 2021-01-27 MED ORDER — OXYCODONE-ACETAMINOPHEN 5-325 MG PO TABS: 1.0000 | ORAL_TABLET | Freq: Four times a day (QID) | ORAL | 0 refills | Status: DC | PRN

## 2021-01-27 MED ORDER — NICOTINE 14 MG/24HR TD PT24
14.0000 mg | MEDICATED_PATCH | Freq: Every day | TRANSDERMAL | Status: DC
  Administered 2021-01-27: 14 mg via TRANSDERMAL
  Filled 2021-01-27: qty 1

## 2021-01-27 NOTE — Progress Notes (Addendum)
  Progress Note    01/27/2021 7:32 AM 1 Day Post-Op  Subjective:  right neck soreness and some posterior neck soreness   Vitals:   01/27/21 0235 01/27/21 0400  BP: (!) 158/91   Pulse: 65   Resp: 16   Temp: 97.8 F (36.6 C) 98.2 F (36.8 C)  SpO2: 100%    Physical Exam: Cardiac:  regular Lungs:  non labored Incisions:  right neck incision is clean, dry and intact. No swelling or hematoma Extremities:  moving all extremities without deficits Neurologic: alert and oriented. CN intact. Smile symmetric. Tongue midline. Speech coherent  CBC    Component Value Date/Time   WBC 13.7 (H) 01/27/2021 0500   RBC 3.87 01/27/2021 0500   HGB 12.8 01/27/2021 0500   HGB 15.2 10/09/2018 1012   HCT 38.2 01/27/2021 0500   HCT 44.1 10/09/2018 1012   PLT 228 01/27/2021 0500   PLT 185 10/09/2018 1012   MCV 98.7 01/27/2021 0500   MCV 100 (H) 10/09/2018 1012   MCH 33.1 01/27/2021 0500   MCHC 33.5 01/27/2021 0500   RDW 11.9 01/27/2021 0500   RDW 12.4 10/09/2018 1012   LYMPHSABS 3.8 07/20/2019 2013   LYMPHSABS 2.7 10/09/2018 1012   MONOABS 1.0 07/20/2019 2013   EOSABS 0.3 07/20/2019 2013   EOSABS 0.1 10/09/2018 1012   BASOSABS 0.1 07/20/2019 2013   BASOSABS 0.1 10/09/2018 1012    BMET    Component Value Date/Time   NA 136 01/27/2021 0500   NA 138 08/25/2019 1142   K 4.8 01/27/2021 0500   CL 101 01/27/2021 0500   CO2 25 01/27/2021 0500   GLUCOSE 113 (H) 01/27/2021 0500   BUN 4 (L) 01/27/2021 0500   BUN 3 (L) 08/25/2019 1142   CREATININE 0.54 01/27/2021 0500   CREATININE 0.50 10/15/2016 1021   CALCIUM 9.3 01/27/2021 0500   GFRNONAA >60 01/27/2021 0500   GFRNONAA >89 10/15/2016 1021   GFRAA >60 05/13/2020 0531   GFRAA >89 10/15/2016 1021    INR    Component Value Date/Time   INR 1.0 01/18/2021 1410     Intake/Output Summary (Last 24 hours) at 01/27/2021 0732 Last data filed at 01/26/2021 1225 Gross per 24 hour  Intake 1200 ml  Output 400 ml  Net 800 ml      Assessment/Plan:  45 y.o. female is s/p right CEA 1 Day Post-Op   - Doing well post op. Neurologically intact. Incision looks good. No swelling or hematoma - Tolerating diet and voiding without difficulty - Pain well controlled - Requesting Nicotine patch. Order placed - Hemodynamically stable - She will continue statin and Plavix - Stable for discharge later today. She will have follow up in 2-3 weeks    Karoline Caldwell, [place] [facility] [phone] 01/27/2021 7:32 AM  I have independently interviewed and examined patient and agree with PA assessment and plan above.  She is on Plavix and a statin.  She does not take aspirin due to intolerance.  She is okay for discharge today.  We had a discussion regarding smoking cessation and she is determined to quit.  She will follow-up in 2 to 3 weeks for wound check.  At that time she would likely be able to return to work at [employer].  Haralambos Yeatts C. Donzetta Matters, MD [facility] [place] Office: [phone] Pager: [phone]

## 2021-01-27 NOTE — Discharge Summary (Signed)
Carotid Discharge Summary     Felicia Perkins 03-Sep-1975 45 y.o. female  [id number]  Admission Date: 01/26/2021  Discharge Date: 01/27/2021  Physician: Thomes Lolling*  Admission Diagnosis: Carotid artery stenosis [facility] Course:  The patient was admitted to the hospital and taken to the operating room on 01/26/2021 and underwent right carotid endarterectomy.  The pt tolerated the procedure well and was transported to the PACU in good condition.   By POD 1, the pt neuro status remains intact. Right neck incision is intact and without swelling or hematoma. Hemodynamically stable. Pain well managed. Voiding without difficulty  The remainder of the hospital course consisted of increasing mobilization and increasing intake of solids without difficulty.  She remained stable for discharge home. She will go home on Statin and Plavix. She has intolerance to Aspirin. Additionally at her request she was given Nicotine patch as she is very adamant about smoking cessation. PDMP was reviewed and post operative pain medication was sent to her pharmacy. She has follow up arranged in 2-3 weeks in our office   Recent Labs    01/27/21 0500  NA 136  K 4.8  CL 101  CO2 25  GLUCOSE 113*  BUN 4*  CALCIUM 9.3   Recent Labs    01/27/21 0500  WBC 13.7*  HGB 12.8  HCT 38.2  PLT 228   No results for input(s): INR in the last 72 hours.   Discharge Instructions     Call MD for:  difficulty breathing, headache or visual disturbances   Complete by: As directed    Call MD for:  persistant dizziness or light-headedness   Complete by: As directed    Call MD for:  redness, tenderness, or signs of infection (pain, swelling, redness, odor or green/yellow discharge around incision site)   Complete by: As directed    Call MD for:  severe uncontrolled pain   Complete by: As directed    Call MD for:  temperature >100.4   Complete by: As directed    Diet - low sodium  heart healthy   Complete by: As directed    Discharge patient   Complete by: As directed    Discharge disposition: 01-Home or Self Care   Discharge patient date: 01/27/2021   Discharge wound care:   Complete by: As directed    Clean incision daily with mild soap and water, pat dry   Driving Restrictions   Complete by: As directed    No driving for 3 weeks or while taking pain medication   Increase activity slowly   Complete by: As directed    Lifting restrictions   Complete by: As directed    No heavy lifting, pushing, pulling > 10 lbs       Discharge Diagnosis:  Carotid artery stenosis [I65.29]  Secondary Diagnosis: Patient Active Problem List   Diagnosis Date Noted   Carotid artery stenosis 01/26/2021   Anxiety    Cancer (HCC)    Cirrhosis (HCC)    GERD (gastroesophageal reflux disease)    Pancreatitis    PTSD (post-traumatic stress disorder)    Vascular disease    Acute on chronic pancreatitis ([place]) 03/22/2019   Bipolar 1 disorder ([place]) 01/13/2019   Eczema 10/09/2018   Acute pancreatitis    Alcohol induced acute pancreatitis 11/22/2017   Duodenitis 11/22/2017   Tobacco abuse counseling    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   Nondisplaced fracture of neck of fifth metacarpal bone, right hand, initial encounter for  closed fracture 76/28/3151   Alcoholic cirrhosis of liver without ascites ([place]) 10/15/2016   Alcohol abuse 09/04/2016   Gastritis and gastroduodenitis 09/04/2016   Tobacco abuse 09/04/2016   COPD (chronic obstructive pulmonary disease) ([place]) 09/04/2016   Elevated liver enzymes 12/22/2014   Abdominal pain, epigastric 12/16/2014   Chest pain with moderate risk for cardiac etiology 12/15/2014   Alcohol dependence ([place]) 12/16/2012    Class: Acute   Past Medical History:  Diagnosis Date   Abdominal pain, epigastric 12/16/2014   Acute on chronic pancreatitis ([place]) 03/22/2019   Acute pancreatitis    Alcohol abuse    Alcohol dependence ([place]) 12/16/2012    Medical detox successful    Alcohol induced acute pancreatitis 7/61/6073   Alcoholic cirrhosis of liver without ascites ([place]) 10/15/2016   Anxiety    Bipolar 1 disorder (HCC)    Cancer ([place])    cervical cancer   Chest pain with moderate risk for cardiac etiology 12/15/2014   Cirrhosis ([place])    COPD (chronic obstructive pulmonary disease) ([place]) 09/04/2016   Coronary artery disease    Depression    Duodenitis 11/22/2017   Eczema 10/09/2018   Elevated liver enzymes 12/22/2014   Gastritis and gastroduodenitis 09/04/2016   GERD (gastroesophageal reflux disease)    Nondisplaced fracture of neck of fifth metacarpal bone, right hand, initial encounter for closed fracture 08/26/2017   Pancreatitis    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   clopidogrel 75 MG tablet Commonly known as: Plavix Take 1 tablet (75 mg total) by mouth daily.   Dulera 200-5 MCG/ACT Aero Generic drug: mometasone-formoterol Inhale 2 puffs into the lungs 2 (two) times daily as needed for wheezing.   famotidine 20 MG tablet Commonly known as: PEPCID Take 1 tablet (20 mg total) by mouth 2 (two) times daily.   folic acid 710 MCG tablet Commonly known as: FOLVITE Take 400 mcg by mouth daily.   hydrOXYzine 25 MG tablet Commonly known as: ATARAX/VISTARIL Take 25 mg by mouth as needed for itching.   methotrexate 2.5 MG tablet Commonly known as: RHEUMATREX Take 2.5 mg by mouth See admin instructions. Take 2.5 mg at 0700 and 1900 Saturday, and 2.5 mg at 0700 on Sunday every week.   metoprolol tartrate 100 MG tablet Commonly known as:  LOPRESSOR Take 2 hours prior to CT   multivitamin with minerals Tabs tablet Take 1 tablet by mouth daily.   nicotine 14 mg/24hr patch Commonly known as: NICODERM CQ - dosed in mg/24 hours Place 1 patch (14 mg total) onto the skin daily.   nitroGLYCERIN 0.4 MG SL tablet Commonly known as: NITROSTAT Place 1 tablet (0.4 mg total) under the tongue every 5 (five) minutes as needed for chest pain. Up to 3 times.   oxyCODONE-acetaminophen 5-325 MG tablet Commonly known as: PERCOCET/ROXICET Take 1 tablet by mouth every 6 (six) hours as needed for severe pain.   pantoprazole 40 MG tablet Commonly known as: PROTONIX TAKE 1 TABLET BY MOUTH TWICE A DAY   rosuvastatin 10 MG tablet Commonly known as:  Crestor Take 1 tablet (10 mg total) by mouth daily.               Discharge Care Instructions  (From admission, onward)           Start     Ordered   01/27/21 0000  Discharge wound care:       Comments: Clean incision daily with mild soap and water, pat dry   01/27/21 1021             Discharge Instructions:   [facility] Discharge Instructions Carotid Endarterectomy (CEA)  Please refer to the following instructions for your post-procedure care. Your surgeon or physician assistant will discuss any changes with you.  Activity  You are encouraged to walk as much as you can. You can slowly return to normal activities but must avoid strenuous activity and heavy lifting until your doctor tell you it's OK. Avoid activities such as vacuuming or swinging a golf club. You can drive after one week if you are comfortable and you are no longer taking prescription pain medications. It is normal to feel tired for serval weeks after your surgery. It is also normal to have difficulty with sleep habits, eating, and bowel movements after surgery. These will go away with time.  Bathing/Showering  You may shower after you come home. Do not soak in a  bathtub, hot tub, or swim until the incision heals completely.  Incision Care  Shower every day. Clean your incision with mild soap and water. Pat the area dry with a clean towel. You do not need a bandage unless otherwise instructed. Do not apply any ointments or creams to your incision. You may have skin glue on your incision. Do not peel it off. It will come off on its own in about one week. Your incision may feel thickened and raised for several weeks after your surgery. This is normal and the skin will soften over time. For Men Only: It's OK to shave around the incision but do not shave the incision itself for 2 weeks. It is common to have numbness under your chin that could last for several months.  Diet  Resume your normal diet. There are no special food restrictions following this procedure. A low fat/low cholesterol diet is recommended for all patients with vascular disease. In order to heal from your surgery, it is CRITICAL to get adequate nutrition. Your body requires vitamins, minerals, and protein. Vegetables are the best source of vitamins and minerals. Vegetables also provide the perfect balance of protein. Processed food has little nutritional value, so try to avoid this.  Medications  Resume taking all of your medications unless your doctor or physician assistant tells you not to.  If your incision is causing pain, you may take over-the- counter pain relievers such as acetaminophen (Tylenol). If you were prescribed a stronger pain medication, please be aware these medications can cause nausea and constipation.  Prevent nausea by taking the medication with a snack or meal. Avoid constipation by drinking plenty of fluids and eating foods with a high amount of fiber, such as fruits, vegetables, and grains. Do not take Tylenol if you are taking prescription pain medications.  Follow Up  Our office will schedule a follow up appointment 2-3 weeks following discharge.  Please call us  immediately for any of the following conditions  Increased pain, redness, drainage (pus) from your incision site. Fever of 101 degrees or higher. If you should develop stroke (  slurred speech, difficulty swallowing, weakness on one side of your body, loss of vision) you should call 911 and go to the nearest emergency room.  Reduce your risk of vascular disease:  Stop smoking. If you would like help call [facility] at 1-800-QUIT-NOW (780)179-0261) or [place] at [phone]. Manage your cholesterol Maintain a desired weight Control your diabetes Keep your blood pressure down  If you have any questions, please call the office at [phone].  Prescriptions given: Hydrocodone- Acetaminophen 5-325mg  #15 No Refill  Disposition: Home  Patient's condition: is Excellent  Follow up: 1. Dr. Donzetta Matters in 2 weeks.   Kami Kube PA-C [facility] [phone]   --- For [facility] Registry use ---   Modified Rankin score at D/C (0-6): 0  IV medication needed for:  1. Hypertension: No 2. Hypotension: No  Post-op Complications: No  1. Post-op CVA or TIA: No  If yes: Event classification (right eye, left eye, right cortical, left cortical, verterobasilar, other): N/a  If yes: Timing of event (intra-op, <6 hrs post-op, >=6 hrs post-op, unknown): N/a  2. CN injury: No  If yes: CN not injuried   3. Myocardial infarction: No  If yes: Dx by (EKG or clinical, Troponin): N/A  4.  CHF: No  5.  Dysrhythmia (new): No  6. Wound infection: No  7. Reperfusion symptoms: No  8. Return to OR: No  If yes: return to OR for (bleeding, neurologic, other CEA incision, other): No  Discharge medications: Statin use:  Yes ASA use:  No   Beta blocker use:  yes ACE-Inhibitor use:  No  ARB use:  No CCB use: No P2Y12 Antagonist use: Yes, [ X] Plavix, [ ]  Plasugrel, [ ]  Ticlopinine, [ ]  Ticagrelor, [ ]  Other, [ ]  No for medical reason, [ ]  Non-compliant, [ ]   Not-indicated Anti-coagulant use:  No, [ ]  Warfarin, [ ]  Rivaroxaban, [ ]  Dabigatran,

## 2021-01-30 ENCOUNTER — Emergency Department (HOSPITAL_COMMUNITY)
Admission: EM | Admit: 2021-01-30 | Discharge: 2021-01-30 | Disposition: A | Discharge status: Home / Self Care | Payer: Medicaid Other | Attending: Emergency Medicine | Admitting: Emergency Medicine

## 2021-01-30 ENCOUNTER — Ambulatory Visit (HOSPITAL_COMMUNITY)
Admission: EM | Admit: 2021-01-30 | Discharge: 2021-01-30 | Disposition: A | Discharge status: Nursing Home (Includes ICF) | Payer: Medicaid Other

## 2021-01-30 ENCOUNTER — Telehealth: Payer: Self-pay

## 2021-01-30 ENCOUNTER — Other Ambulatory Visit: Payer: Self-pay

## 2021-01-30 ENCOUNTER — Encounter (HOSPITAL_COMMUNITY): Payer: Self-pay | Admitting: *Deleted

## 2021-01-30 DIAGNOSIS — R221 Localized swelling, mass and lump, neck: Secondary | ICD-10-CM | POA: Insufficient documentation

## 2021-01-30 DIAGNOSIS — Z5321 Procedure and treatment not carried out due to patient leaving prior to being seen by health care provider: Secondary | ICD-10-CM | POA: Diagnosis not present

## 2021-01-30 LAB — COMPREHENSIVE METABOLIC PANEL
ALT: 97 U/L — ABNORMAL HIGH (ref 0–44)
AST: 126 U/L — ABNORMAL HIGH (ref 15–41)
Albumin: 3.3 g/dL — ABNORMAL LOW (ref 3.5–5.0)
Alkaline Phosphatase: 139 U/L — ABNORMAL HIGH (ref 38–126)
Anion gap: 8 (ref 5–15)
BUN: <5 mg/dL — ABNORMAL LOW (ref 6–20)
CO2: 28 mmol/L (ref 22–32)
Calcium: 9.3 mg/dL (ref 8.9–10.3)
Chloride: 102 mmol/L (ref 98–111)
Creatinine, Ser: 0.43 mg/dL — ABNORMAL LOW (ref 0.44–1.00)
GFR, Estimated: >60 mL/min (ref >60)
Glucose, Bld: 100 mg/dL — ABNORMAL HIGH (ref 70–99)
Potassium: 3.7 mmol/L (ref 3.5–5.1)
Sodium: 138 mmol/L (ref 135–145)
Total Bilirubin: 0.8 mg/dL (ref 0.3–1.2)
Total Protein: 6.4 g/dL — ABNORMAL LOW (ref 6.5–8.1)

## 2021-01-30 LAB — CBC WITH DIFFERENTIAL/PLATELET
Abs Immature Granulocytes: 0.03 10*3/uL (ref 0.00–0.07)
Basophils Absolute: 0.1 10*3/uL (ref 0.0–0.1)
Basophils Relative: 1 %
Eosinophils Absolute: 0.6 10*3/uL — ABNORMAL HIGH (ref 0.0–0.5)
Eosinophils Relative: 6 %
HCT: 38.8 % (ref 36.0–46.0)
Hemoglobin: 13.1 g/dL (ref 12.0–15.0)
Immature Granulocytes: 0 %
Lymphocytes Relative: 23 %
Lymphs Abs: 2.1 10*3/uL (ref 0.7–4.0)
MCH: 33.6 pg (ref 26.0–34.0)
MCHC: 33.8 g/dL (ref 30.0–36.0)
MCV: 99.5 fL (ref 80.0–100.0)
Monocytes Absolute: 1 10*3/uL (ref 0.1–1.0)
Monocytes Relative: 11 %
Neutro Abs: 5.5 10*3/uL (ref 1.7–7.7)
Neutrophils Relative %: 59 %
Platelets: 256 10*3/uL (ref 150–400)
RBC: 3.9 MIL/uL (ref 3.87–5.11)
RDW: 11.9 % (ref 11.5–15.5)
WBC: 9.2 10*3/uL (ref 4.0–10.5)
nRBC: 0 % (ref 0.0–0.2)

## 2021-01-30 MED ORDER — SODIUM CHLORIDE 0.9 % IV SOLN: Freq: Once | INTRAVENOUS | Status: DC

## 2021-01-30 MED ORDER — HYDROMORPHONE HCL 1 MG/ML IJ SOLN: 1.0000 mg | Freq: Once | INTRAMUSCULAR | Status: DC

## 2021-01-30 NOTE — ED Triage Notes (Signed)
Pt sent here from [facility]. Pt had right cartoid endart 6/16 and now has increase in throat swelling. Is able to swallow but feels like it is difficult to swallow food. Is able to speak in full sentences, no resp distress noted at this time.

## 2021-01-30 NOTE — Telephone Encounter (Signed)
Patient called to report that she was in the ED with increased throat swelling and difficulty swallowing since last night. She would like to go home since she has been there for 6 hours. Advised that she stay in the hospital. Patient verbalized understanding. Since the call, patient has left ED without further evaluation.

## 2021-01-30 NOTE — ED Notes (Signed)
Pt notified staff that she was leaving due to being in pain after having to wait so long and wanted to go home and take her pan medicine.

## 2021-01-30 NOTE — ED Triage Notes (Signed)
Pt S/P right carotid endart 6/16; c/o feeling her throat closing onset last night.  States able to swallow her saliva.  Swelling noted to right neck. Discussed with Lina Sar, PA - pt to go to ED.  Patient is being discharged from the [facility] and sent to the Emergency Department via wheelchair by staff. Per M. Mani, [place], patient is stable but in need of higher level of care due to throat swelling S/P carotid endart. Patient is aware and verbalizes understanding of plan of care. There were no vitals filed for this visit.

## 2021-01-30 NOTE — ED Provider Notes (Signed)
Emergency Medicine Provider Triage Evaluation Note  Felicia Perkins , a 45 y.o. female  was evaluated in triage.  Pt complains of right carotid endarterectomy 6\16.  No change in pain but increasing difficulty swallowing.  Not having to spit out saliva.  Able to take liquids by pouring them in her mouth and allowing him to trickle down her throat  Review of Systems  Positive: Hoarse voice Negative: Chest pain or shortness of breath  Physical Exam  BP 115/80 (BP Location: Right Arm)   Pulse 78   Temp 98.8 F (37.1 C) (Oral)   Resp 18   SpO2 100%  Gen:   Awake, no distress   Resp:  Normal effort  MSK:   Moves extremities without difficulty  Other:  Voice hoarse, moderate to large swelling under chin and right lateral neck, some asymmetric tonsillar swelling on the right.  Medical Decision Making  Medically screening exam initiated at 9:22 AM.  Appropriate orders placed.  Felicia Perkins was informed that the remainder of the evaluation will be completed by another provider, this initial triage assessment does not replace that evaluation, and the importance of remaining in the ED until their evaluation is complete.     Felicia Shanks, MD 02/05/21 [phone]

## 2021-01-30 NOTE — Telephone Encounter (Signed)
Transition Care Management Unsuccessful Follow-up Telephone Call  Date of discharge and from where:  01/27/2021, [facility]   Attempts:  1st Attempt  Reason for unsuccessful TCM follow-up call:  patient currently in ED

## 2021-01-31 ENCOUNTER — Telehealth: Payer: Self-pay

## 2021-01-31 ENCOUNTER — Encounter (HOSPITAL_COMMUNITY): Payer: Self-pay | Admitting: Vascular Surgery

## 2021-01-31 NOTE — Telephone Encounter (Signed)
Transition Care Management Unsuccessful Follow-up Telephone Call  Date of discharge and from where:  01/27/2021, [facility]  Was seen in ED 01/30/2021 but left without being seen  Attempts:  1st Attempt  Reason for unsuccessful TCM follow-up call:  Left voice message on # [phone]. Cal back requested to this CM

## 2021-02-01 ENCOUNTER — Telehealth: Payer: Self-pay

## 2021-02-01 NOTE — Telephone Encounter (Signed)
Transition Care Management Follow-up Telephone Call Date of discharge and from where: 01/27/2021, [facility]  How have you been since you were released from the hospital? She said she is doing much better now.  She explained that she returned to the ED because she was having difficulty with her throat.  She ended up leaving before being seen because the wait was too long. She said that she came home and took an ant-inflammatory and is now much better. She said she is eating solids and liquids without any difficulty. Any questions or concerns? No  Items Reviewed: Did the pt receive and understand the discharge instructions provided? Yes  Medications obtained and verified? Yes  - she said she has all medications.  Other? No  Any new allergies since your discharge? No  Dietary orders reviewed? Yes Do you have support at home? Yes , family  Home Care and Equipment/Supplies: Were home health services ordered? no If so, what is the name of the agency? N/a  Has the agency set up a time to come to the patient's home? not applicable Were any new equipment or medical supplies ordered?  No What is the name of the medical supply agency? N/a Were you able to get the supplies/equipment? not applicable Do you have any questions related to the use of the equipment or supplies? No  Functional Questionnaire: (I = Independent and D = Dependent) ADLs: independent   Follow up appointments reviewed:  PCP Hospital f/u appt confirmed? No  - She said she will call to schedule an appointment after she completed her appointments that are already scheduled [facility] f/u appt confirmed? Yes  Scheduled to see [facility]- 02/17/2021 and cardiology - 03/15/2021.   Are transportation arrangements needed? No  If their condition worsens, is the pt aware to call PCP or go to the Emergency Dept.? Yes Was the patient provided with contact information for the PCP's office or ED? Yes Was to pt encouraged to call  back with questions or concerns? Yes

## 2021-02-01 NOTE — Telephone Encounter (Signed)
From the discharge call:   She said she is doing much better now.  She explained that she returned to the ED because she was having difficulty with her throat.  She ended up leaving before being seen because the wait was too long. She said that she came home and took an ant-inflammatory and is now much better. She said she is eating solids and liquids without any difficulty.   She said she will call to schedule an appointment after she completed her appointments that are already scheduled  Appointment with [facility]- 02/17/2021 and cardiology - 03/15/2021.    No questions/concerns at this time

## 2021-02-17 ENCOUNTER — Other Ambulatory Visit: Payer: Self-pay

## 2021-02-17 ENCOUNTER — Other Ambulatory Visit: Payer: Self-pay | Admitting: Family Medicine

## 2021-02-17 ENCOUNTER — Ambulatory Visit (INDEPENDENT_AMBULATORY_CARE_PROVIDER_SITE_OTHER): Payer: Medicaid Other | Admitting: Physician Assistant

## 2021-02-17 VITALS — BP 114/78 | HR 71 | Temp 97.7°F | Resp 14 | Ht 64.0 in | Wt 105.0 lb

## 2021-02-17 DIAGNOSIS — Z9889 Other specified postprocedural states: Secondary | ICD-10-CM

## 2021-02-17 DIAGNOSIS — I6523 Occlusion and stenosis of bilateral carotid arteries: Secondary | ICD-10-CM

## 2021-02-17 MED ORDER — PANTOPRAZOLE SODIUM 40 MG PO TBEC: 40.0000 mg | DELAYED_RELEASE_TABLET | Freq: Two times a day (BID) | ORAL | 0 refills | Status: DC

## 2021-02-17 NOTE — Progress Notes (Signed)
POST OPERATIVE OFFICE NOTE    CC:  F/u for surgery  HPI:  This is a 45 y.o. female who is s/p right carotid endarterectomy by Dr. Donzetta Matters on 01/26/21. This was performed for asymptomatic high grade right ICA stenosis. She had an uncomplicated post operative course and was discharged post op day 1.   She denies any amaurosis fugax, slurred speech, trouble swallowing, facial drooping, weakness of upper or lower extremities. She does report that she has been having pretty regular headaches mostly at back of head but occasionally on the sides. They last several hours. She says sometimes they are bad enough she just has to lay down. She reports that she knows she has high blood pressure but she does not take any medication for this that she is aware of. She has stopped smoking since her surgery with only one time since that she has smoked. She is using nicotine patch.   No Known Allergies  Current Outpatient Medications  Medication Sig Dispense Refill   albuterol (VENTOLIN HFA) 108 (90 Base) MCG/ACT inhaler Inhale 1-2 puffs into the lungs every 6 (six) hours as needed for wheezing or shortness of breath. 8.5 g 2   clopidogrel (PLAVIX) 75 MG tablet Take 1 tablet (75 mg total) by mouth daily. 30 tablet 11   famotidine (PEPCID) 20 MG tablet Take 1 tablet (20 mg total) by mouth 2 (two) times daily. 60 tablet 2   folic acid (FOLVITE) 025 MCG tablet Take 400 mcg by mouth daily.     hydrOXYzine (ATARAX/VISTARIL) 25 MG tablet Take 25 mg by mouth as needed for itching.     methotrexate (RHEUMATREX) 2.5 MG tablet Take 2.5 mg by mouth See admin instructions. Take 2.5 mg at 0700 and 1900 Saturday, and 2.5 mg at 0700 on Sunday every week.     metoprolol tartrate (LOPRESSOR) 100 MG tablet Take 2 hours prior to CT 1 tablet 0   mometasone-formoterol (DULERA) 200-5 MCG/ACT AERO Inhale 2 puffs into the lungs 2 (two) times daily as needed for wheezing.     Multiple Vitamin (MULTIVITAMIN WITH MINERALS) TABS tablet Take 1  tablet by mouth daily.     nicotine (NICODERM CQ - DOSED IN MG/24 HOURS) 14 mg/24hr patch Place 1 patch (14 mg total) onto the skin daily. 30 patch 2   nitroGLYCERIN (NITROSTAT) 0.4 MG SL tablet Place 1 tablet (0.4 mg total) under the tongue every 5 (five) minutes as needed for chest pain. Up to 3 times. 90 tablet 3   rosuvastatin (CRESTOR) 10 MG tablet Take 1 tablet (10 mg total) by mouth daily. 30 tablet 11   oxyCODONE-acetaminophen (PERCOCET/ROXICET) 5-325 MG tablet Take 1 tablet by mouth every 6 (six) hours as needed for severe pain. (Patient not taking: Reported on 02/17/2021) 15 tablet 0   pantoprazole (PROTONIX) 40 MG tablet Take 1 tablet (40 mg total) by mouth 2 (two) times daily. 20 tablet 0   No current facility-administered medications for this visit.     ROS:  See HPI  Physical Exam:  Vitals:   02/17/21 1013 02/17/21 1019  BP: 108/72 114/78  Pulse: 71 71  Resp: 14   Temp: 97.7 F (36.5 C)   TempSrc: Temporal   SpO2: 99%   Weight: 105 lb (47.6 kg)   Height: 5\' 4"  (1.626 m)     General: thin, well appearing, in no distress Incision:  right neck incision is healing well, intact, no swelling or hematoma Extremities:  well perfused and warm, moving without  any deficits Neuro: alert and oriented, CN intact, speech coherent, smile symmetric, sensation intact and equal bilaterally  Assessment/Plan:  This is a 45 y.o. female who is s/p right carotid endarterectomy by Dr. Donzetta Matters on 01/26/21. This was performed for asymptomatic high grade right ICA stenosis.She remains neurologically intact. Her right neck incision is healing well. She has been having headaches since her surgery. I sense her headaches are due to her elevated blood pressure. I think her blood pressure is poorly controlled. She has metoprolol listed under her medications but she says she is not taking this. She does not have access to a machine to check her blood pressure regularly at home. Her SBP pre operatively was in  the 170s. Today her blood pressure is good and she is presently not having a headache. II recommend she follow up with her PCP for further evaluation and management of this. She also has appointment with Cardiologist on 8/3. - Continue Plavix and Crestor - She will follow up sooner if she has any new or concerning symptoms - She will follow up in 9 months with carotid duplex   Karoline Caldwell, PA-C [facility] [phone]  On call MD:  Dr. Stanford Breed

## 2021-02-20 ENCOUNTER — Other Ambulatory Visit: Payer: Self-pay

## 2021-02-20 DIAGNOSIS — Z9889 Other specified postprocedural states: Secondary | ICD-10-CM

## 2021-03-02 DIAGNOSIS — I2510 Atherosclerotic heart disease of native coronary artery without angina pectoris: Secondary | ICD-10-CM | POA: Insufficient documentation

## 2021-03-02 DIAGNOSIS — F32A Depression, unspecified: Secondary | ICD-10-CM | POA: Insufficient documentation

## 2021-03-07 ENCOUNTER — Telehealth: Payer: Self-pay | Admitting: *Deleted

## 2021-03-07 NOTE — Telephone Encounter (Signed)
I called patient for 90-day Identify study phone call. I left message for patient to call me or respond to my e-.mail.

## 2021-03-15 ENCOUNTER — Other Ambulatory Visit: Payer: Self-pay

## 2021-03-15 ENCOUNTER — Ambulatory Visit (INDEPENDENT_AMBULATORY_CARE_PROVIDER_SITE_OTHER): Payer: Medicaid Other | Admitting: Cardiology

## 2021-03-15 ENCOUNTER — Encounter: Payer: Self-pay | Admitting: Cardiology

## 2021-03-15 VITALS — BP 146/90 | HR 76 | Ht 64.0 in | Wt 99.1 lb

## 2021-03-15 DIAGNOSIS — I10 Essential (primary) hypertension: Secondary | ICD-10-CM

## 2021-03-15 DIAGNOSIS — I6521 Occlusion and stenosis of right carotid artery: Secondary | ICD-10-CM | POA: Diagnosis not present

## 2021-03-15 DIAGNOSIS — F17200 Nicotine dependence, unspecified, uncomplicated: Secondary | ICD-10-CM | POA: Diagnosis not present

## 2021-03-15 DIAGNOSIS — E785 Hyperlipidemia, unspecified: Secondary | ICD-10-CM

## 2021-03-15 DIAGNOSIS — Z9889 Other specified postprocedural states: Secondary | ICD-10-CM | POA: Diagnosis not present

## 2021-03-15 MED ORDER — AMLODIPINE BESYLATE 2.5 MG PO TABS: 2.5000 mg | ORAL_TABLET | Freq: Every day | ORAL | 3 refills | Status: DC

## 2021-03-15 NOTE — Patient Instructions (Signed)
Medication Instructions:  Your physician has recommended you make the following change in your medication:  START: Amlodipine 2.5 mg once daily Please buy a blood pressure cuff.   *If you need a refill on your cardiac medications before your next appointment, please call your pharmacy*   Lab Work: None If you have labs (blood work) drawn today and your tests are completely normal, you will receive your results only by: [place] (if you have MyChart) OR A paper copy in the mail If you have any lab test that is abnormal or we need to change your treatment, we will call you to review the results.   Testing/Procedures: None   Follow-Up: At [facility], you and your health needs are our priority.  As part of our continuing mission to provide you with exceptional heart care, we have created designated Provider Care Teams.  These Care Teams include your primary Cardiologist (physician) and Advanced Practice Providers (APPs -  Physician Assistants and Nurse Practitioners) who all work together to provide you with the care you need, when you need it.  We recommend signing up for the patient portal called "MyChart".  Sign up information is provided on this After Visit Summary.  MyChart is used to connect with patients for Virtual Visits (Telemedicine).  Patients are able to view lab/test results, encounter notes, upcoming appointments, etc.  Non-urgent messages can be sent to your provider as well.   To learn more about what you can do with MyChart, go to [email or web address].    Your next appointment:   3 month(s)  The format for your next appointment:   In Person  Provider:   [address] - Berniece Salines, DO    Other Instructions

## 2021-03-15 NOTE — Progress Notes (Signed)
Cardiology Office Note:    Date:  03/16/2021   ID:  Felicia Perkins, DOB 1975/12/12, MRN [id number]  PCP:  Charlott Rakes, MD  Cardiologist:  Berniece Salines, DO  Electrophysiologist:  None   Referring MD: Charlott Rakes, MD   No chief complaint on file.   History of Present Illness:    Felicia Perkins is a 45 y.o. female with a hx of carotid artery disease status post right carotid enterectomy which was done on January 26, 2021, smoker, bipolar disorder, liver cirrhosis.   At her visit on December 01, 2020 she was experiencing chest discomfort given the fact that she was also going for vascular surgery was send the patient for coronary CTA.  Her coronary CTA was reported with no evidence of coronary artery disease, there was evidence of mild aortic atherosclerosis.  She is here today for follow-up visit.  She has cut back on smoking.  No chest pain or shortness of breath.  He has recovered well from her vascular surgery.  Past Medical History:  Diagnosis Date   Abdominal pain, epigastric 12/16/2014   Acute on chronic pancreatitis ([place]) 03/22/2019   Acute pancreatitis    Alcohol abuse    Alcohol dependence ([place]) 12/16/2012   Medical detox successful    Alcohol induced acute pancreatitis [id number]   Alcoholic cirrhosis of liver without ascites ([place]) 10/15/2016   Anxiety    Bipolar 1 disorder (HCC)    Cancer ([place])    cervical cancer   Chest pain with moderate risk for cardiac etiology 12/15/2014   Cirrhosis ([place])    COPD (chronic obstructive pulmonary disease) ([place]) 09/04/2016   Coronary artery disease    Depression    Duodenitis 11/22/2017   Eczema 10/09/2018   Elevated liver enzymes 12/22/2014   Gastritis and gastroduodenitis 09/04/2016   GERD (gastroesophageal reflux disease)    Nondisplaced fracture of neck of fifth metacarpal bone, right hand, initial encounter for closed fracture 08/26/2017   Pancreatitis    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   PTSD (post-traumatic stress disorder)     Tobacco abuse 09/04/2016   Tobacco abuse counseling    Vascular disease     Past Surgical History:  Procedure Laterality Date   ABDOMINAL HYSTERECTOMY  2004   BIOPSY  01/02/2018   Procedure: BIOPSY;  Surgeon: Milus Banister, MD;  Location: [facility];  Service: Endoscopy;;   ENDARTERECTOMY Right 01/26/2021   Procedure: RIGHT CAROTID ARTERY ENDARTERECTOMY;  Surgeon: Waynetta Sandy, MD;  Location: [place];  Service: Vascular;  Laterality: Right;   ESOPHAGOGASTRODUODENOSCOPY (EGD) WITH PROPOFOL N/A 01/02/2018   Procedure: ESOPHAGOGASTRODUODENOSCOPY (EGD) WITH PROPOFOL;  Surgeon: Milus Banister, MD;  Location: [facility];  Service: Endoscopy;  Laterality: N/A;   EUS N/A 01/02/2018   Procedure: UPPER ENDOSCOPIC ULTRASOUND (EUS) RADIAL;  Surgeon: Milus Banister, MD;  Location: [facility];  Service: Endoscopy;  Laterality: N/A;   FOOT SURGERY     OTHER SURGICAL HISTORY     ovarian surgery   TUBAL LIGATION      Current Medications: Current Meds  Medication Sig   albuterol (VENTOLIN HFA) 108 (90 Base) MCG/ACT inhaler Inhale 1-2 puffs into the lungs every 6 (six) hours as needed for wheezing or shortness of breath.   amLODipine (NORVASC) 2.5 MG tablet Take 1 tablet (2.5 mg total) by mouth daily.   clopidogrel (PLAVIX) 75 MG tablet Take 1 tablet (75 mg total) by mouth daily.   famotidine (PEPCID) 20 MG tablet Take 1 tablet (20 mg total)  by mouth 2 (two) times daily.   folic acid (FOLVITE) [id number] MCG tablet Take 400 mcg by mouth daily.   hydrOXYzine (ATARAX/VISTARIL) 25 MG tablet Take 25 mg by mouth as needed for itching.   methotrexate (RHEUMATREX) 2.5 MG tablet Take 2.5 mg by mouth See admin instructions. Take 2.5 mg at 0700 and 1900 Saturday, and 2.5 mg at 0700 on Sunday every week.   mometasone-formoterol (DULERA) 200-5 MCG/ACT AERO Inhale 2 puffs into the lungs 2 (two) times daily as needed for wheezing.   Multiple Vitamin (MULTIVITAMIN WITH MINERALS) TABS tablet Take 1 tablet  by mouth daily.   nicotine (NICODERM CQ - DOSED IN MG/24 HOURS) 14 mg/24hr patch Place 1 patch (14 mg total) onto the skin daily.   pantoprazole (PROTONIX) 40 MG tablet Take 1 tablet (40 mg total) by mouth 2 (two) times daily.   rosuvastatin (CRESTOR) 10 MG tablet Take 1 tablet (10 mg total) by mouth daily.     Allergies:   Patient has no known allergies.   Social History   Socioeconomic History   Marital status: Single    Spouse name: Not on file   Number of children: 3   Years of education: Not on file   Highest education level: Not on file  Occupational History   Occupation: homemaker  Tobacco Use   Smoking status: Every Day    Packs/day: 0.50    Types: Cigarettes   Smokeless tobacco: Never   Tobacco comments:    2-3 cigarettes daily  Vaping Use   Vaping Use: Never used  Substance and Sexual Activity   Alcohol use: Not Currently    Comment: no alchol for the past 3 weeks   Drug use: No   Sexual activity: Yes    Birth control/protection: Condom  Other Topics Concern   Not on file  Social History Narrative   Not on file   Social Determinants of Health   Financial Resource Strain: Not on file  Food Insecurity: Not on file  Transportation Needs: Not on file  Physical Activity: Not on file  Stress: Not on file  Social Connections: Not on file     Family History: The patient's family history includes Breast cancer in her maternal aunt and maternal aunt; Breast cancer (age of onset: 42) in her mother; Heart attack (age of onset: 73) in her cousin; Hypertension in her father and mother; Pancreatitis in her mother. There is no history of Colon cancer, Esophageal cancer, Rectal cancer, or Stomach cancer.  ROS:   Review of Systems  Constitution: Negative for decreased appetite, fever and weight gain.  HENT: Negative for congestion, ear discharge, hoarse voice and sore throat.   Eyes: Negative for discharge, redness, vision loss in right eye and visual halos.   Cardiovascular: Negative for chest pain, dyspnea on exertion, leg swelling, orthopnea and palpitations.  Respiratory: Negative for cough, hemoptysis, shortness of breath and snoring.   Endocrine: Negative for heat intolerance and polyphagia.  Hematologic/Lymphatic: Negative for bleeding problem. Does not bruise/bleed easily.  Skin: Negative for flushing, nail changes, rash and suspicious lesions.  Musculoskeletal: Negative for arthritis, joint pain, muscle cramps, myalgias, neck pain and stiffness.  Gastrointestinal: Negative for abdominal pain, bowel incontinence, diarrhea and excessive appetite.  Genitourinary: Negative for decreased libido, genital sores and incomplete emptying.  Neurological: Negative for brief paralysis, focal weakness, headaches and loss of balance.  Psychiatric/Behavioral: Negative for altered mental status, depression and suicidal ideas.  Allergic/Immunologic: Negative for HIV exposure and persistent infections.  EKGs/Labs/Other Studies Reviewed:    The following studies were reviewed today:   EKG: None today Coronary CTA5/09/2020 FINDINGS: Non-cardiac: See separate report from [facility]. No significant findings on limited lung and soft tissue windows.   Calcium score: No calcium noted   Coronary Arteries: Right dominant with no anomalies   LM: Normal   LAD: Normal   IM: Normal   D1: Normal   Circumflex: Normal   OM1: Normal   OM2: Normal   RCA: Normal   PDA: Normal   PLA: Normal   IMPRESSION: 1. Calcium score 0   2.  Normal right dominant coronary arteries   3.  Normal aortic root 2.9 cm    Recent Labs: 01/30/2021: ALT 97; BUN <5; Creatinine, Ser 0.43; Hemoglobin 13.1; Platelets 256; Potassium 3.7; Sodium 138  Recent Lipid Panel    Component Value Date/Time   CHOL 240 (H) 03/22/2019 1428   TRIG 90 03/22/2019 1428   HDL 65 03/22/2019 1428   CHOLHDL 3.7 03/22/2019 1428   VLDL 18 03/22/2019 1428   LDLCALC 157 (H)  03/22/2019 1428    Physical Exam:    VS:  BP (!) 146/90 (BP Location: Right Arm, Patient Position: Sitting, Cuff Size: Small)   Pulse 76   Ht '5\' 4"'$  (1.626 m)   Wt 99 lb 1.9 oz (45 kg)   SpO2 99%   BMI 17.01 kg/m     Wt Readings from Last 3 Encounters:  03/15/21 99 lb 1.9 oz (45 kg)  02/17/21 105 lb (47.6 kg)  01/26/21 105 lb (47.6 kg)     GEN: Well nourished, well developed in no acute distress HEENT: Normal NECK: No JVD; No carotid bruits LYMPHATICS: No lymphadenopathy CARDIAC: S1S2 noted,RRR, no murmurs, rubs, gallops RESPIRATORY:  Clear to auscultation without rales, wheezing or rhonchi  ABDOMEN: Soft, non-tender, non-distended, +bowel sounds, no guarding. EXTREMITIES: No edema, No cyanosis, no clubbing MUSCULOSKELETAL:  No deformity  SKIN: Warm and dry NEUROLOGIC:  Alert and oriented x 3, non-focal PSYCHIATRIC:  Normal affect, good insight  ASSESSMENT:    1. Hypertension, unspecified type   2. Smoker   3. Stenosis of right carotid artery   4. History of carotid endarterectomy   5. Hyperlipidemia, unspecified hyperlipidemia type    PLAN:     Her blood pressure is elevated in the office today.  I like to do a started patient on low-dose antihypertensive medication amlodipine 2.5 mg daily. We talked about the detrimental effects of smoking to her heart health.  All of her questions has been answered.  She targets to quit smoking by Thanksgiving. She also is going to get the automated blood pressure cuff to be able to take her blood pressure daily.  Advised the patient should her blood pressure be less than [id number] systolic to notify my office.  To also notify my office if you experience any dizziness or swelling with the amlodipine. Hyperlipidemia - continue with current statin medication.  The patient is in agreement with the above plan. The patient left the office in stable condition.  The patient will follow up in   Medication Adjustments/Labs and Tests  Ordered: Current medicines are reviewed at length with the patient today.  Concerns regarding medicines are outlined above.  No orders of the defined types were placed in this encounter.  Meds ordered this encounter  Medications   amLODipine (NORVASC) 2.5 MG tablet    Sig: Take 1 tablet (2.5 mg total) by mouth daily.    Dispense:  90 tablet    Refill:  3    Patient Instructions  Medication Instructions:  Your physician has recommended you make the following change in your medication:  START: Amlodipine 2.5 mg once daily Please buy a blood pressure cuff.   *If you need a refill on your cardiac medications before your next appointment, please call your pharmacy*   Lab Work: None If you have labs (blood work) drawn today and your tests are completely normal, you will receive your results only by: [place] (if you have MyChart) OR A paper copy in the mail If you have any lab test that is abnormal or we need to change your treatment, we will call you to review the results.   Testing/Procedures: None   Follow-Up: At [facility], you and your health needs are our priority.  As part of our continuing mission to provide you with exceptional heart care, we have created designated Provider Care Teams.  These Care Teams include your primary Cardiologist (physician) and Advanced Practice Providers (APPs -  Physician Assistants and Nurse Practitioners) who all work together to provide you with the care you need, when you need it.  We recommend signing up for the patient portal called "MyChart".  Sign up information is provided on this After Visit Summary.  MyChart is used to connect with patients for Virtual Visits (Telemedicine).  Patients are able to view lab/test results, encounter notes, upcoming appointments, etc.  Non-urgent messages can be sent to your provider as well.   To learn more about what you can do with MyChart, go to [email or web address].    Your next  appointment:   3 month(s)  The format for your next appointment:   In Person  Provider:   [address] - Berniece Salines, DO    Other Instructions    Adopting a Healthy Lifestyle.  Know what a healthy weight is for you (roughly BMI <25) and aim to maintain this   Aim for 7+ servings of fruits and vegetables daily   65-80+ fluid ounces of water or unsweet tea for healthy kidneys   Limit to max 1 drink of alcohol per day; avoid smoking/tobacco   Limit animal fats in diet for cholesterol and heart health - choose grass fed whenever available   Avoid highly processed foods, and foods high in saturated/trans fats   Aim for low stress - take time to unwind and care for your mental health   Aim for 150 min of moderate intensity exercise weekly for heart health, and weights twice weekly for bone health   Aim for 7-9 hours of sleep daily   When it comes to diets, agreement about the perfect plan isnt easy to find, even among the experts. Experts at the [employer] developed an idea known as the Healthy Eating Plate. Just imagine a plate divided into logical, healthy portions.   The emphasis is on diet quality:   Load up on vegetables and fruits - one-half of your plate: Aim for color and variety, and remember that potatoes dont count.   Go for whole grains - one-quarter of your plate: Whole wheat, barley, wheat berries, quinoa, oats, brown rice, and foods made with them. If you want pasta, go with whole wheat pasta.   Protein power - one-quarter of your plate: Fish, chicken, beans, and nuts are all healthy, versatile protein sources. Limit red meat.   The diet, however, does go beyond the plate, offering a few other suggestions.  Use healthy plant oils, such as olive, canola, soy, corn, sunflower and peanut. Check the labels, and avoid partially hydrogenated oil, which have unhealthy trans fats.   If youre thirsty, drink water. Coffee and tea are good in  moderation, but skip sugary drinks and limit milk and dairy products to one or two daily servings.   The type of carbohydrate in the diet is more important than the amount. Some sources of carbohydrates, such as vegetables, fruits, whole grains, and beans-are healthier than others.   Finally, stay active  Signed, Berniece Salines, DO  03/16/2021 8:29 PM    [facility]

## 2021-03-16 DIAGNOSIS — F17200 Nicotine dependence, unspecified, uncomplicated: Secondary | ICD-10-CM | POA: Insufficient documentation

## 2021-03-16 DIAGNOSIS — I10 Essential (primary) hypertension: Secondary | ICD-10-CM | POA: Insufficient documentation

## 2021-03-16 DIAGNOSIS — Z9889 Other specified postprocedural states: Secondary | ICD-10-CM | POA: Insufficient documentation

## 2021-05-08 ENCOUNTER — Other Ambulatory Visit: Payer: Self-pay

## 2021-05-08 ENCOUNTER — Ambulatory Visit (HOSPITAL_COMMUNITY)
Admission: EM | Admit: 2021-05-08 | Discharge: 2021-05-08 | Disposition: A | Discharge status: Home / Self Care | Payer: Medicaid Other | Attending: Internal Medicine | Admitting: Internal Medicine

## 2021-05-08 ENCOUNTER — Encounter (HOSPITAL_COMMUNITY): Payer: Self-pay

## 2021-05-08 DIAGNOSIS — R1012 Left upper quadrant pain: Secondary | ICD-10-CM

## 2021-05-08 MED ORDER — ONDANSETRON 4 MG PO TBDP
ORAL_TABLET | ORAL | Status: AC
  Filled 2021-05-08: qty 1

## 2021-05-08 MED ORDER — ONDANSETRON 4 MG PO TBDP
4.0000 mg | ORAL_TABLET | Freq: Once | ORAL | Status: AC
  Administered 2021-05-08: 4 mg via ORAL

## 2021-05-08 NOTE — ED Triage Notes (Signed)
Pt c/o upper abdominal pain with vomiting since this am. States vomiting x4-5. States hx of pancreatitis and feels the same.

## 2021-05-08 NOTE — ED Provider Notes (Signed)
[facility]    CSN: [id number] Arrival date & time: 05/08/21  1424      History   Chief Complaint Chief Complaint  Patient presents with   Emesis   Abdominal Pain    HPI Felicia Perkins is a 45 y.o. female.   Patient here for evaluation of left upper quadrant pain and vomiting that has been ongoing since this morning.  Reports vomiting 4-5 times.  Reports severe pain.  Reports history of pancreatitis with similar symptoms in the past.  Denies any trauma, injury, or other precipitating event.  Denies any specific alleviating or aggravating factors.  Denies any fevers, chest pain, shortness of breath, numbness, tingling, weakness, or headaches.    The history is provided by the patient.  Emesis Associated symptoms: abdominal pain   Abdominal Pain Associated symptoms: nausea and vomiting    Past Medical History:  Diagnosis Date   Abdominal pain, epigastric 12/16/2014   Acute on chronic pancreatitis ([place]) 03/22/2019   Acute pancreatitis    Alcohol abuse    Alcohol dependence ([place]) 12/16/2012   Medical detox successful    Alcohol induced acute pancreatitis 7/84/6962   Alcoholic cirrhosis of liver without ascites ([place]) 10/15/2016   Anxiety    Bipolar 1 disorder (HCC)    Cancer ([place])    cervical cancer   Chest pain with moderate risk for cardiac etiology 12/15/2014   Cirrhosis ([place])    COPD (chronic obstructive pulmonary disease) ([place]) 09/04/2016   Coronary artery disease    Depression    Duodenitis 11/22/2017   Eczema 10/09/2018   Elevated liver enzymes 12/22/2014   Gastritis and gastroduodenitis 09/04/2016   GERD (gastroesophageal reflux disease)    Nondisplaced fracture of neck of fifth metacarpal bone, right hand, initial encounter for closed fracture 08/26/2017   Pancreatitis    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   PTSD (post-traumatic stress disorder)    Tobacco abuse 09/04/2016   Tobacco abuse counseling    Vascular disease     Patient Active Problem List    Diagnosis Date Noted   Hypertension 03/16/2021   Smoker 03/16/2021   History of carotid endarterectomy 03/16/2021   Coronary artery disease 03/02/2021   Depression 03/02/2021   Carotid artery stenosis 01/26/2021   Anxiety    Cancer ([place])    Cirrhosis (HCC)    GERD (gastroesophageal reflux disease)    Pancreatitis    PTSD (post-traumatic stress disorder)    Vascular disease    Acute on chronic pancreatitis ([place]) 03/22/2019   Bipolar 1 disorder ([place]) 01/13/2019   Eczema 10/09/2018   Acute pancreatitis    Alcohol induced acute pancreatitis 11/22/2017   Duodenitis 11/22/2017   Tobacco abuse counseling    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   Nondisplaced fracture of neck of fifth metacarpal bone, right hand, initial encounter for closed fracture 95/28/4132   Alcoholic cirrhosis of liver without ascites ([place]) 10/15/2016   Alcohol abuse 09/04/2016   Gastritis and gastroduodenitis 09/04/2016   Tobacco abuse 09/04/2016   COPD (chronic obstructive pulmonary disease) ([place]) 09/04/2016   Elevated liver enzymes 12/22/2014   Abdominal pain, epigastric 12/16/2014   Chest pain with moderate risk for cardiac etiology 12/15/2014   Alcohol dependence ([place]) 12/16/2012    Class: Acute    Past Surgical History:  Procedure Laterality Date   ABDOMINAL HYSTERECTOMY  2004   BIOPSY  01/02/2018   Procedure: BIOPSY;  Surgeon: Milus Banister, MD;  Location: [facility];  Service: Endoscopy;;   ENDARTERECTOMY Right 01/26/2021   Procedure:  RIGHT CAROTID ARTERY ENDARTERECTOMY;  Surgeon: Waynetta Sandy, MD;  Location: [facility];  Service: Vascular;  Laterality: Right;   ESOPHAGOGASTRODUODENOSCOPY (EGD) WITH PROPOFOL N/A 01/02/2018   Procedure: ESOPHAGOGASTRODUODENOSCOPY (EGD) WITH PROPOFOL;  Surgeon: Milus Banister, MD;  Location: [facility];  Service: Endoscopy;  Laterality: N/A;   EUS N/A 01/02/2018   Procedure: UPPER ENDOSCOPIC ULTRASOUND (EUS) RADIAL;  Surgeon: Milus Banister, MD;  Location:  [facility];  Service: Endoscopy;  Laterality: N/A;   FOOT SURGERY     OTHER SURGICAL HISTORY     ovarian surgery   TUBAL LIGATION      OB History   No obstetric history on file.      Home Medications    Prior to Admission medications   Medication Sig Start Date End Date Taking? Authorizing Provider  albuterol (VENTOLIN HFA) 108 (90 Base) MCG/ACT inhaler Inhale 1-2 puffs into the lungs every 6 (six) hours as needed for wheezing or shortness of breath. 04/01/20   Charlott Rakes, MD  amLODipine (NORVASC) 2.5 MG tablet Take 1 tablet (2.5 mg total) by mouth daily. 03/15/21 06/13/21  Tobb, Godfrey Pick, DO  clopidogrel (PLAVIX) 75 MG tablet Take 1 tablet (75 mg total) by mouth daily. 11/18/20   Waynetta Sandy, MD  famotidine (PEPCID) 20 MG tablet Take 1 tablet (20 mg total) by mouth 2 (two) times daily. 08/24/20   Argentina Donovan, PA-C  folic acid (FOLVITE) 725 MCG tablet Take 400 mcg by mouth daily.    [provider]  hydrOXYzine (ATARAX/VISTARIL) 25 MG tablet Take 25 mg by mouth as needed for itching. 12/09/20   [provider]  methotrexate (RHEUMATREX) 2.5 MG tablet Take 2.5 mg by mouth See admin instructions. Take 2.5 mg at 0700 and 1900 Saturday, and 2.5 mg at 0700 on Sunday every week. 11/11/20   [provider]  mometasone-formoterol (DULERA) 200-5 MCG/ACT AERO Inhale 2 puffs into the lungs 2 (two) times daily as needed for wheezing.    [provider]  Multiple Vitamin (MULTIVITAMIN WITH MINERALS) TABS tablet Take 1 tablet by mouth daily.    [provider]  nicotine (NICODERM CQ - DOSED IN MG/24 HOURS) 14 mg/24hr patch Place 1 patch (14 mg total) onto the skin daily. 01/27/21 01/27/22  Baglia, Corrina, PA-C  nitroGLYCERIN (NITROSTAT) 0.4 MG SL tablet Place 1 tablet (0.4 mg total) under the tongue every 5 (five) minutes as needed for chest pain. Up to 3 times. 12/01/20 03/01/21  Tobb, Kardie, DO  pantoprazole (PROTONIX) 40 MG tablet Take 1  tablet (40 mg total) by mouth 2 (two) times daily. 02/17/21   Charlott Rakes, MD  rosuvastatin (CRESTOR) 10 MG tablet Take 1 tablet (10 mg total) by mouth daily. 11/18/20   Waynetta Sandy, MD  cloNIDine (CATAPRES) 0.1 MG tablet Take 1 tablet (0.1 mg total) by mouth at bedtime. For hot flashes Patient not taking: Reported on 10/17/2019 01/13/19 08/15/20  Charlott Rakes, MD    Family History Family History  Problem Relation Age of Onset   Hypertension Mother    Breast cancer Mother 72   Pancreatitis Mother    Hypertension Father    Heart attack Cousin 25       died in 31s of MI   Breast cancer Maternal Aunt    Breast cancer Maternal Aunt    Colon cancer Neg Hx    Esophageal cancer Neg Hx    Rectal cancer Neg Hx    Stomach cancer Neg Hx  Social History Social History   Tobacco Use   Smoking status: Every Day    Packs/day: 0.50    Types: Cigarettes   Smokeless tobacco: Never   Tobacco comments:    2-3 cigarettes daily  Vaping Use   Vaping Use: Never used  Substance Use Topics   Alcohol use: Yes    Comment: 05/06/21   Drug use: No     Allergies   Patient has no known allergies.   Review of Systems Review of Systems  Gastrointestinal:  Positive for abdominal pain, nausea and vomiting.  All other systems reviewed and are negative.   Physical Exam Triage Vital Signs ED Triage Vitals  Enc Vitals Group     BP 05/08/21 1625 (!) 161/88     Pulse Rate 05/08/21 1625 71     Resp 05/08/21 1625 18     Temp 05/08/21 1625 98 F (36.7 C)     Temp Source 05/08/21 1625 Oral     SpO2 05/08/21 1625 100 %     Weight --      Height --      Head Circumference --      Peak Flow --      Pain Score 05/08/21 1628 10     Pain Loc --      Pain Edu? --      Excl. in [place]? --    No data found.  Updated Vital Signs BP (!) 161/88 (BP Location: Left Arm)   Pulse 71   Temp 98 F (36.7 C) (Oral)   Resp 18   SpO2 100%   Visual Acuity Right Eye Distance:   Left Eye  Distance:   Bilateral Distance:    Right Eye Near:   Left Eye Near:    Bilateral Near:     Physical Exam Vitals and nursing note reviewed.  Constitutional:      General: She is not in acute distress.    Appearance: Normal appearance. She is not ill-appearing, toxic-appearing or diaphoretic.  HENT:     Head: Normocephalic and atraumatic.  Eyes:     Conjunctiva/sclera: Conjunctivae normal.  Cardiovascular:     Rate and Rhythm: Normal rate.     Pulses: Normal pulses.  Pulmonary:     Effort: Pulmonary effort is normal.  Abdominal:     General: Abdomen is flat.     Palpations: Abdomen is soft.     Tenderness: There is abdominal tenderness in the left upper quadrant.     Hernia: No hernia is present.  Musculoskeletal:        General: Normal range of motion.     Cervical back: Normal range of motion.  Skin:    General: Skin is warm and dry.  Neurological:     General: No focal deficit present.     Mental Status: She is alert and oriented to person, place, and time.  Psychiatric:        Mood and Affect: Mood normal.     UC Treatments / Results  Labs (all labs ordered are listed, but only abnormal results are displayed) Labs Reviewed - No data to display  EKG   Radiology No results found.  Procedures Procedures (including critical care time)  Medications Ordered in UC Medications  ondansetron (ZOFRAN-ODT) disintegrating tablet 4 mg (4 mg Oral Given 05/08/21 1634)    Initial Impression / Assessment and Plan / UC Course  I have reviewed the triage vital signs and the nursing notes.  Pertinent labs &  imaging results that were available during my care of the patient were reviewed by me and considered in my medical decision making (see chart for details).    Concern for possible pancreatitis.  Based on severe abdominal pain recommend patient go to the emergency room for further evaluation at a higher level of care.  Patient verbalized understanding.  Vital signs are  stable and patient may transport to the emergency room via private vehicle. Final Clinical Impressions(s) / UC Diagnoses   Final diagnoses:  Abdominal pain, left upper quadrant   Discharge Instructions   None    ED Prescriptions   None    PDMP not reviewed this encounter.   Pearson Forster, NP 05/08/21 1650

## 2021-05-27 ENCOUNTER — Other Ambulatory Visit: Payer: Self-pay | Admitting: Family Medicine

## 2021-05-27 DIAGNOSIS — J438 Other emphysema: Secondary | ICD-10-CM

## 2021-05-27 NOTE — Telephone Encounter (Signed)
Requested medication (s) are due for refill today: -  Requested medication (s) are on the active medication list: historical med  Last refill:  11/29/20  Future visit scheduled: no  Notes to clinic:  historical provider   Requested Prescriptions  Pending Prescriptions Disp Refills   [place] 200-5 MCG/ACT AERO [Pharmacy Med Name: DULERA 200 MCG-5 MCG INHALER] 13 each     Sig: TAKE 2 PUFFS BY MOUTH TWICE A DAY     Pulmonology:  Combination Products Passed - 05/27/2021  5:36 PM      Passed - Valid encounter within last 12 months    Recent Outpatient Visits           9 months ago Carotid bruit, unspecified laterality   [place], [place], [place]   1 year ago Dermatitis   [place], [place], MD   1 year ago Neuropathy, alcoholic [facility])   [place], Charlane Ferretti, MD   2 years ago Alcoholic cirrhosis of liver without ascites [facility])   [place], [place]   2 years ago Alcohol abuse   [place], Enobong, MD       Future Appointments             In 3 weeks Tobb, [place], DO [facility], [facility]

## 2021-06-20 ENCOUNTER — Ambulatory Visit: Payer: Medicaid Other | Admitting: Cardiology

## 2021-06-22 ENCOUNTER — Encounter (HOSPITAL_COMMUNITY): Payer: Self-pay | Admitting: Emergency Medicine

## 2021-06-22 ENCOUNTER — Other Ambulatory Visit: Payer: Self-pay

## 2021-06-22 ENCOUNTER — Emergency Department (HOSPITAL_COMMUNITY): Payer: Medicaid Other

## 2021-06-22 ENCOUNTER — Emergency Department (HOSPITAL_COMMUNITY)
Admission: EM | Admit: 2021-06-22 | Discharge: 2021-06-23 | Disposition: A | Discharge status: Home / Self Care | Payer: Medicaid Other | Attending: Emergency Medicine | Admitting: Emergency Medicine

## 2021-06-22 DIAGNOSIS — R1013 Epigastric pain: Secondary | ICD-10-CM | POA: Diagnosis present

## 2021-06-22 DIAGNOSIS — Z79899 Other long term (current) drug therapy: Secondary | ICD-10-CM | POA: Insufficient documentation

## 2021-06-22 DIAGNOSIS — I119 Hypertensive heart disease without heart failure: Secondary | ICD-10-CM | POA: Insufficient documentation

## 2021-06-22 DIAGNOSIS — F17210 Nicotine dependence, cigarettes, uncomplicated: Secondary | ICD-10-CM | POA: Insufficient documentation

## 2021-06-22 DIAGNOSIS — Z7951 Long term (current) use of inhaled steroids: Secondary | ICD-10-CM | POA: Diagnosis not present

## 2021-06-22 DIAGNOSIS — I2510 Atherosclerotic heart disease of native coronary artery without angina pectoris: Secondary | ICD-10-CM | POA: Diagnosis not present

## 2021-06-22 DIAGNOSIS — K861 Other chronic pancreatitis: Secondary | ICD-10-CM

## 2021-06-22 DIAGNOSIS — Z7902 Long term (current) use of antithrombotics/antiplatelets: Secondary | ICD-10-CM | POA: Insufficient documentation

## 2021-06-22 DIAGNOSIS — K859 Acute pancreatitis without necrosis or infection, unspecified: Secondary | ICD-10-CM | POA: Insufficient documentation

## 2021-06-22 DIAGNOSIS — J449 Chronic obstructive pulmonary disease, unspecified: Secondary | ICD-10-CM | POA: Diagnosis not present

## 2021-06-22 DIAGNOSIS — Z8541 Personal history of malignant neoplasm of cervix uteri: Secondary | ICD-10-CM | POA: Diagnosis not present

## 2021-06-22 LAB — COMPREHENSIVE METABOLIC PANEL
ALT: 24 U/L (ref 0–44)
AST: 27 U/L (ref 15–41)
Albumin: 3.7 g/dL (ref 3.5–5.0)
Alkaline Phosphatase: 107 U/L (ref 38–126)
Anion gap: 8 (ref 5–15)
BUN: <5 mg/dL — ABNORMAL LOW (ref 6–20)
CO2: 25 mmol/L (ref 22–32)
Calcium: 9.2 mg/dL (ref 8.9–10.3)
Chloride: 104 mmol/L (ref 98–111)
Creatinine, Ser: 0.54 mg/dL (ref 0.44–1.00)
GFR, Estimated: >60 mL/min (ref >60)
Glucose, Bld: 120 mg/dL — ABNORMAL HIGH (ref 70–99)
Potassium: 3.4 mmol/L — ABNORMAL LOW (ref 3.5–5.1)
Sodium: 137 mmol/L (ref 135–145)
Total Bilirubin: 0.8 mg/dL (ref 0.3–1.2)
Total Protein: 6.5 g/dL (ref 6.5–8.1)

## 2021-06-22 LAB — CBC WITH DIFFERENTIAL/PLATELET
Abs Immature Granulocytes: 0.03 10*3/uL (ref 0.00–0.07)
Basophils Absolute: 0.1 10*3/uL (ref 0.0–0.1)
Basophils Relative: 1 %
Eosinophils Absolute: 0.3 10*3/uL (ref 0.0–0.5)
Eosinophils Relative: 4 %
HCT: 35.5 % — ABNORMAL LOW (ref 36.0–46.0)
Hemoglobin: 12.3 g/dL (ref 12.0–15.0)
Immature Granulocytes: 0 %
Lymphocytes Relative: 44 %
Lymphs Abs: 3.1 10*3/uL (ref 0.7–4.0)
MCH: 33.5 pg (ref 26.0–34.0)
MCHC: 34.6 g/dL (ref 30.0–36.0)
MCV: 96.7 fL (ref 80.0–100.0)
Monocytes Absolute: 0.6 10*3/uL (ref 0.1–1.0)
Monocytes Relative: 8 %
Neutro Abs: 3 10*3/uL (ref 1.7–7.7)
Neutrophils Relative %: 43 %
Platelets: 224 10*3/uL (ref 150–400)
RBC: 3.67 MIL/uL — ABNORMAL LOW (ref 3.87–5.11)
RDW: 12.2 % (ref 11.5–15.5)
WBC: 7 10*3/uL (ref 4.0–10.5)
nRBC: 0 % (ref 0.0–0.2)

## 2021-06-22 LAB — LIPASE, BLOOD: Lipase: 190 U/L — ABNORMAL HIGH (ref 11–51)

## 2021-06-22 LAB — PREGNANCY, URINE: Preg Test, Ur: NEGATIVE

## 2021-06-22 LAB — URINALYSIS, ROUTINE W REFLEX MICROSCOPIC
Bilirubin Urine: NEGATIVE
Glucose, UA: NEGATIVE mg/dL
Ketones, ur: NEGATIVE mg/dL
Leukocytes,Ua: NEGATIVE
Nitrite: NEGATIVE
Protein, ur: NEGATIVE mg/dL
Specific Gravity, Urine: 1.004 — ABNORMAL LOW (ref 1.005–1.030)
pH: 7 (ref 5.0–8.0)

## 2021-06-22 MED ORDER — ONDANSETRON 4 MG PO TBDP
8.0000 mg | ORAL_TABLET | Freq: Once | ORAL | Status: AC
  Administered 2021-06-22: 8 mg via ORAL
  Filled 2021-06-22: qty 2

## 2021-06-22 NOTE — ED Triage Notes (Signed)
Pt here for abd pain, vomiting and constipation that started a few days ago. Pt reports epigastric sharp pain. Pt was unable to tolerate POs but was able to eat today. Last BM yesterday morning, says she feels like she needs to have one but can't go.

## 2021-06-22 NOTE — ED Provider Notes (Signed)
Emergency Medicine Provider Triage Evaluation Note  Felicia Perkins , a 45 y.o. female  was evaluated in triage.  Pt complains of epigastric abdominal pain that started on Saturday.  The pain is constant, associated with nausea.  Reports she has been having irregular bowel movements despite using Maalox.  Last bowel movement was yesterday, she has not passed gas since then.  No prior abdominal surgeries.  Has not drank alcohol since June, takes a lot of Motrin.  No history of gastric ulcers, the abdominal pain is worse after she eats..  Review of Systems  Positive: Epigastric abdominal pain, constipation, nausea Negative: Rectal bleeding  Physical Exam  BP (!) 128/94 (BP Location: Right Arm)   Pulse 65   Temp 98.2 F (36.8 C) (Oral)   Resp 18   SpO2 98%  Gen:   Awake, no distress   Resp:  Normal effort  MSK:   Moves extremities without difficulty  Other:  Abdomen soft, epigastric tenderness.  No right upper quadrant tenderness, negative [place]  Medical Decision Making  Medically screening exam initiated at 3:28 PM.  Appropriate orders placed.  Aziya NOEMY HALLMON was informed that the remainder of the evaluation will be completed by another provider, this initial triage assessment does not replace that evaluation, and the importance of remaining in the ED until their evaluation is complete.  Abdominal lab   Sherrill Raring, PA-C 06/22/21 [place], [place], DO 06/22/21 1533

## 2021-06-23 MED ORDER — LIDOCAINE VISCOUS HCL 2 % MT SOLN
15.0000 mL | Freq: Once | OROMUCOSAL | Status: AC
  Administered 2021-06-23: 15 mL via ORAL
  Filled 2021-06-23: qty 15

## 2021-06-23 MED ORDER — ONDANSETRON 4 MG PO TBDP: 4.0000 mg | ORAL_TABLET | Freq: Three times a day (TID) | ORAL | 0 refills | Status: AC | PRN

## 2021-06-23 MED ORDER — FENTANYL CITRATE PF 50 MCG/ML IJ SOSY
50.0000 ug | PREFILLED_SYRINGE | Freq: Once | INTRAMUSCULAR | Status: AC
  Administered 2021-06-23: 50 ug via INTRAVENOUS
  Filled 2021-06-23: qty 1

## 2021-06-23 MED ORDER — LIDOCAINE VISCOUS HCL 2 % MT SOLN: 15.0000 mL | Freq: Four times a day (QID) | OROMUCOSAL | 0 refills | Status: DC | PRN

## 2021-06-23 MED ORDER — ALUM & MAG HYDROXIDE-SIMETH 200-200-20 MG/5ML PO SUSP
30.0000 mL | Freq: Once | ORAL | Status: AC
  Administered 2021-06-23: 30 mL via ORAL
  Filled 2021-06-23: qty 30

## 2021-06-23 MED ORDER — SUCRALFATE 1 GM/10ML PO SUSP: 1.0000 g | Freq: Two times a day (BID) | ORAL | 0 refills | Status: DC

## 2021-06-23 MED ORDER — SODIUM CHLORIDE 0.9 % IV BOLUS
500.0000 mL | Freq: Once | INTRAVENOUS | Status: AC
  Administered 2021-06-23: 500 mL via INTRAVENOUS

## 2021-06-23 NOTE — ED Notes (Signed)
Patient verbalizes understanding of discharge instructions. Opportunity for questioning and answers were provided. Armband removed by staff, pt discharged from ED ambulatory.   

## 2021-06-23 NOTE — ED Provider Notes (Signed)
[facility] EMERGENCY DEPARTMENT Provider Note  CSN: [id number] Arrival date & time: 06/22/21 1329  Chief Complaint(s) Abdominal Pain  HPI Felicia Perkins is a 45 y.o. female with a past medical history listed below including chronic pancreatitis who presents to the emergency department with 5 days of epigastric abdominal discomfort that began after eating [employer].  Pain has persisted and worse with p.o. intake.  No alleviating factors.  She endorses nausea but no vomiting.  Reports that she has been drinking coffee all week believing she needed to use the restroom.  She denies any fevers or chills.  No coughing or congestion.  No diarrhea.  Patient also reports that she takes a lot of Motrin.   Abdominal Pain  Past Medical History Past Medical History:  Diagnosis Date   Abdominal pain, epigastric 12/16/2014   Acute on chronic pancreatitis ([place]) 03/22/2019   Acute pancreatitis    Alcohol abuse    Alcohol dependence ([place]) 12/16/2012   Medical detox successful    Alcohol induced acute pancreatitis 3/53/2992   Alcoholic cirrhosis of liver without ascites ([place]) 10/15/2016   Anxiety    Bipolar 1 disorder (HCC)    Cancer ([place])    cervical cancer   Chest pain with moderate risk for cardiac etiology 12/15/2014   Cirrhosis ([place])    COPD (chronic obstructive pulmonary disease) ([place]) 09/04/2016   Coronary artery disease    Depression    Duodenitis 11/22/2017   Eczema 10/09/2018   Elevated liver enzymes 12/22/2014   Gastritis and gastroduodenitis 09/04/2016   GERD (gastroesophageal reflux disease)    Nondisplaced fracture of neck of fifth metacarpal bone, right hand, initial encounter for closed fracture 08/26/2017   Pancreatitis    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   PTSD (post-traumatic stress disorder)    Tobacco abuse 09/04/2016   Tobacco abuse counseling    Vascular disease    Patient Active Problem List   Diagnosis Date Noted   Hypertension 03/16/2021   Smoker  03/16/2021   History of carotid endarterectomy 03/16/2021   Coronary artery disease 03/02/2021   Depression 03/02/2021   Carotid artery stenosis 01/26/2021   Anxiety    Cancer (HCC)    Cirrhosis (HCC)    GERD (gastroesophageal reflux disease)    Pancreatitis    PTSD (post-traumatic stress disorder)    Vascular disease    Acute on chronic pancreatitis ([place]) 03/22/2019   Bipolar 1 disorder ([place]) 01/13/2019   Eczema 10/09/2018   Acute pancreatitis    Alcohol induced acute pancreatitis 11/22/2017   Duodenitis 11/22/2017   Tobacco abuse counseling    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   Nondisplaced fracture of neck of fifth metacarpal bone, right hand, initial encounter for closed fracture 42/68/3419   Alcoholic cirrhosis of liver without ascites ([place]) 10/15/2016   Alcohol abuse 09/04/2016   Gastritis and gastroduodenitis 09/04/2016   Tobacco abuse 09/04/2016   COPD (chronic obstructive pulmonary disease) ([place]) 09/04/2016   Elevated liver enzymes 12/22/2014   Abdominal pain, epigastric 12/16/2014   Chest pain with moderate risk for cardiac etiology 12/15/2014   Alcohol dependence ([place]) 12/16/2012    Class: Acute   Home Medication(s) Prior to Admission medications   Medication Sig Start Date End Date Taking? Authorizing Provider  albuterol (VENTOLIN HFA) 108 (90 Base) MCG/ACT inhaler Inhale 1-2 puffs into the lungs every 6 (six) hours as needed for wheezing or shortness of breath. 04/01/20  Yes Newlin, Enobong, MD  amLODipine (NORVASC) 2.5 MG tablet Take 1 tablet (2.5 mg total) by  mouth daily. 03/15/21 06/22/21 Yes Tobb, Kardie, DO  clopidogrel (PLAVIX) 75 MG tablet Take 1 tablet (75 mg total) by mouth daily. 11/18/20  Yes Waynetta Sandy, MD  famotidine (PEPCID) 20 MG tablet Take 1 tablet (20 mg total) by mouth 2 (two) times daily. Patient taking differently: Take 20 mg by mouth daily. 08/24/20  Yes Argentina Donovan, PA-C  hydrOXYzine (ATARAX/VISTARIL) 25 MG tablet Take 25 mg by  mouth daily as needed for itching. 12/09/20  Yes [provider]  lidocaine (XYLOCAINE) 2 % solution Use as directed 15 mLs in the mouth or throat every 6 (six) hours as needed for mouth pain. 06/23/21  Yes Anadelia Kintz, Grayce Sessions, MD  mometasone-formoterol (DULERA) 200-5 MCG/ACT AERO TAKE 2 PUFFS BY MOUTH TWICE A DAY Patient taking differently: Inhale 2 puffs into the lungs daily as needed. 05/29/21  Yes Charlott Rakes, MD  nitroGLYCERIN (NITROSTAT) 0.4 MG SL tablet Place 1 tablet (0.4 mg total) under the tongue every 5 (five) minutes as needed for chest pain. Up to 3 times. 12/01/20 06/22/21 Yes Tobb, Kardie, DO  ondansetron (ZOFRAN ODT) 4 MG disintegrating tablet Take 1 tablet (4 mg total) by mouth every 8 (eight) hours as needed for up to 3 days for nausea or vomiting. 06/23/21 06/26/21 Yes Dyna Figuereo, Grayce Sessions, MD  pantoprazole (PROTONIX) 40 MG tablet Take 1 tablet (40 mg total) by mouth 2 (two) times daily. 02/17/21  Yes Charlott Rakes, MD  rosuvastatin (CRESTOR) 10 MG tablet Take 1 tablet (10 mg total) by mouth daily. 11/18/20  Yes Waynetta Sandy, MD  sucralfate (CARAFATE) 1 GM/10ML suspension Take 10 mLs (1 g total) by mouth 2 (two) times daily. 06/23/21  Yes Keli Buehner, Grayce Sessions, MD  nicotine (NICODERM CQ - DOSED IN MG/24 HOURS) 14 mg/24hr patch Place 1 patch (14 mg total) onto the skin daily. Patient not taking: Reported on 06/22/2021 01/27/21 01/27/22  Karoline Caldwell, PA-C  cloNIDine (CATAPRES) 0.1 MG tablet Take 1 tablet (0.1 mg total) by mouth at bedtime. For hot flashes Patient not taking: Reported on 10/17/2019 01/13/19 08/15/20  Charlott Rakes, MD                                                                                                                                    Past Surgical History Past Surgical History:  Procedure Laterality Date   ABDOMINAL HYSTERECTOMY  2004   BIOPSY  01/02/2018   Procedure: BIOPSY;  Surgeon: Milus Banister, MD;  Location: [facility]  ENDOSCOPY;  Service: Endoscopy;;   ENDARTERECTOMY Right 01/26/2021   Procedure: RIGHT CAROTID ARTERY ENDARTERECTOMY;  Surgeon: Waynetta Sandy, MD;  Location: [facility];  Service: Vascular;  Laterality: Right;   ESOPHAGOGASTRODUODENOSCOPY (EGD) WITH PROPOFOL N/A 01/02/2018   Procedure: ESOPHAGOGASTRODUODENOSCOPY (EGD) WITH PROPOFOL;  Surgeon: Milus Banister, MD;  Location: [facility] ENDOSCOPY;  Service: Endoscopy;  Laterality: N/A;   EUS N/A 01/02/2018   Procedure: UPPER ENDOSCOPIC ULTRASOUND (  EUS) RADIAL;  Surgeon: Milus Banister, MD;  Location: [facility];  Service: Endoscopy;  Laterality: N/A;   FOOT SURGERY     OTHER SURGICAL HISTORY     ovarian surgery   TUBAL LIGATION     Family History Family History  Problem Relation Age of Onset   Hypertension Mother    Breast cancer Mother 79   Pancreatitis Mother    Hypertension Father    Heart attack Cousin 13       died in 56s of MI   Breast cancer Maternal Aunt    Breast cancer Maternal Aunt    Colon cancer Neg Hx    Esophageal cancer Neg Hx    Rectal cancer Neg Hx    Stomach cancer Neg Hx     Social History Social History   Tobacco Use   Smoking status: Every Day    Packs/day: 0.50    Types: Cigarettes   Smokeless tobacco: Never   Tobacco comments:    2-3 cigarettes daily  Vaping Use   Vaping Use: Never used  Substance Use Topics   Alcohol use: Yes    Comment: 05/06/21   Drug use: No   Allergies Patient has no known allergies.  Review of Systems Review of Systems  Gastrointestinal:  Positive for abdominal pain.  All other systems are reviewed and are negative for acute change except as noted in the HPI  Physical Exam Vital Signs  I have reviewed the triage vital signs BP (!) 126/96   Pulse 61   Temp 98 F (36.7 C) (Oral)   Resp 18   SpO2 100%   Physical Exam Vitals reviewed.  Constitutional:      General: She is not in acute distress.    Appearance: She is well-developed. She is not diaphoretic.   HENT:     Head: Normocephalic and atraumatic.     Right Ear: External ear normal.     Left Ear: External ear normal.     Nose: Nose normal.  Eyes:     General: No scleral icterus.    Conjunctiva/sclera: Conjunctivae normal.  Neck:     Trachea: Phonation normal.  Cardiovascular:     Rate and Rhythm: Normal rate and regular rhythm.  Pulmonary:     Effort: Pulmonary effort is normal. No respiratory distress.     Breath sounds: No stridor.  Abdominal:     General: There is no distension.     Tenderness: There is abdominal tenderness in the epigastric area. There is no guarding or rebound. Negative signs include Murphy's sign.  Musculoskeletal:        General: Normal range of motion.     Cervical back: Normal range of motion.  Neurological:     Mental Status: She is alert and oriented to person, place, and time.  Psychiatric:        Behavior: Behavior normal.    ED Results and Treatments Labs (all labs ordered are listed, but only abnormal results are displayed) Labs Reviewed  CBC WITH DIFFERENTIAL/PLATELET - Abnormal; Notable for the following components:      Result Value   RBC 3.67 (*)    HCT 35.5 (*)    All other components within normal limits  COMPREHENSIVE METABOLIC PANEL - Abnormal; Notable for the following components:   Potassium 3.4 (*)    Glucose, Bld 120 (*)    BUN <5 (*)    All other components within normal limits  LIPASE, BLOOD - Abnormal; Notable for  the following components:   Lipase 190 (*)    All other components within normal limits  URINALYSIS, ROUTINE W REFLEX MICROSCOPIC - Abnormal; Notable for the following components:   Specific Gravity, Urine 1.004 (*)    Hgb urine dipstick SMALL (*)    Bacteria, UA RARE (*)    All other components within normal limits  PREGNANCY, URINE                                                                                                                         EKG  EKG Interpretation  Date/Time:    Ventricular  Rate:    PR Interval:    QRS Duration:   QT Interval:    QTC Calculation:   R Axis:     Text Interpretation:         Radiology DG Abdomen 1 View  Result Date: 06/22/2021 CLINICAL DATA:  Constipation EXAM: ABDOMEN - 1 VIEW COMPARISON:  X-ray abdomen 01/02/2019, CT abdomen pelvis 10/24/2020 FINDINGS: The bowel gas pattern is normal. No radio-opaque calculi or other significant radiographic abnormality are seen. IMPRESSION: Negative. Electronically Signed   By: Iven Finn M.D.   On: 06/22/2021 18:09    Pertinent labs & imaging results that were available during my care of the patient were reviewed by me and considered in my medical decision making (see MDM for details).  Medications Ordered in ED Medications  ondansetron (ZOFRAN-ODT) disintegrating tablet 8 mg (8 mg Oral Given 06/22/21 1518)  fentaNYL (SUBLIMAZE) injection 50 mcg (50 mcg Intravenous Given 06/23/21 0406)  alum & mag hydroxide-simeth (MAALOX/MYLANTA) 200-200-20 MG/5ML suspension 30 mL (30 mLs Oral Given 06/23/21 0405)    And  lidocaine (XYLOCAINE) 2 % viscous mouth solution 15 mL (15 mLs Oral Given 06/23/21 0405)  sodium chloride 0.9 % bolus 500 mL (500 mLs Intravenous New Bag/Given 06/23/21 0405)                                                                                                                                     Procedures Procedures  (including critical care time)  Medical Decision Making / ED Course I have reviewed the nursing notes for this encounter and the patient's prior records (if available in EHR or on provided paperwork).  Felicia Perkins was evaluated in Emergency Department on 06/23/2021 for the symptoms described in the history of present illness. She was evaluated  in the context of the global COVID-19 pandemic, which necessitated consideration that the patient might be at risk for infection with the SARS-CoV-2 virus that causes COVID-19. Institutional protocols and algorithms that  pertain to the evaluation of patients at risk for COVID-19 are in a state of rapid change based on information released by regulatory bodies including the CDC and federal and state organizations. These policies and algorithms were followed during the patient's care in the ED.     Epigastric abdominal pain. Work-up is consistent with mild acute on chronic pancreatitis. She was treated with IV fluids and pain medicine.  Also given GI cocktail which provided significant relief in her symptoms. Given the use of Motrin and drinking coffee.  This also likelihood of gastritis/GERD. Patient is tolerating oral intake.  Pancreatic diet.  Pertinent labs & imaging results that were available during my care of the patient were reviewed by me and considered in my medical decision making:    Final Clinical Impression(s) / ED Diagnoses Final diagnoses:  Acute on chronic pancreatitis [facility])   The patient appears reasonably screened and/or stabilized for discharge and I doubt any other medical condition or other [facility] requiring further screening, evaluation, or treatment in the ED at this time prior to discharge. Safe for discharge with strict return precautions.  Disposition: Discharge  Condition: Good  I have discussed the results, Dx and Tx plan with the patient/family who expressed understanding and agree(s) with the plan. Discharge instructions discussed at length. The patient/family was given strict return precautions who verbalized understanding of the instructions. No further questions at time of discharge.    ED Discharge Orders          Ordered    lidocaine (XYLOCAINE) 2 % solution  Every 6 hours PRN        06/23/21 0456    sucralfate (CARAFATE) 1 GM/10ML suspension  2 times daily        06/23/21 0456    ondansetron (ZOFRAN ODT) 4 MG disintegrating tablet  Every 8 hours PRN        06/23/21 0456            Follow Up: Charlott Rakes, MD [place]  [id number] [phone]  Call  to schedule an appointment for close follow up     This chart was dictated using voice recognition software.  Despite best efforts to proofread,  errors can occur which can change the documentation meaning.    Fatima Blank, MD 06/23/21 [phone]

## 2021-07-24 ENCOUNTER — Other Ambulatory Visit: Payer: Self-pay | Admitting: Family Medicine

## 2021-07-24 NOTE — Telephone Encounter (Signed)
Requested medication (s) are due for refill today:   Yes  Requested medication (s) are on the active medication list:   Yes  Future visit scheduled:   No   Last ordered: 02/17/2021 #20, 0 refills  Looks like a limited refill given in July so wasn't sure if Dr. Margarita Rana would want this approved or not.   Review for refill.   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

## 2021-09-26 ENCOUNTER — Other Ambulatory Visit: Payer: Self-pay | Admitting: Family Medicine

## 2021-09-27 NOTE — Telephone Encounter (Signed)
Requested Prescriptions  Pending Prescriptions Disp Refills   pantoprazole (PROTONIX) 40 MG tablet [Pharmacy Med Name: PANTOPRAZOLE SOD DR 40 MG TAB] 20 tablet 0    Sig: TAKE 1 TABLET BY MOUTH TWICE A DAY     Gastroenterology: Proton Pump Inhibitors Failed - 09/26/2021  4:33 PM      Failed - Valid encounter within last 12 months    Recent Outpatient Visits          1 year ago Carotid bruit, unspecified laterality   [place], [place], [place]   1 year ago Dermatitis   [place], Enobong, MD   2 years ago Neuropathy, alcoholic [facility])   [place], Charlane Ferretti, MD   2 years ago Alcoholic cirrhosis of liver without ascites [facility])   [place], Enobong, MD   2 years ago Alcohol abuse   [place], Enobong, MD      Future Appointments            In 1 month Charlott Rakes, MD [place]

## 2021-10-20 ENCOUNTER — Telehealth: Payer: Self-pay | Admitting: Family Medicine

## 2021-10-20 NOTE — Telephone Encounter (Signed)
Copied from [place] [phone]. Topic: General - Other >> Oct 20, 2021  2:12 PM Felicia Perkins wrote: Reason for CRM: Pt called in to see about getting some medication for her allergies, pt states she was taking something for it before but could not remember the name, she thinks its something like Zertec, or anything that could help, please advise.

## 2021-10-20 NOTE — Telephone Encounter (Signed)
Ok to fill or does patient need a virtual visit. ?

## 2021-10-21 NOTE — Telephone Encounter (Signed)
Please schedule a virtual visit. Last visit  was 14 months ago. ?

## 2021-10-23 NOTE — Telephone Encounter (Signed)
Pt has appointment set for 11/01/2021 at 210, pt is aware that appointment is virtual. ?

## 2021-10-24 ENCOUNTER — Ambulatory Visit (HOSPITAL_COMMUNITY)
Admission: EM | Admit: 2021-10-24 | Discharge: 2021-10-24 | Disposition: A | Discharge status: Home / Self Care | Payer: Medicaid Other | Attending: Family Medicine | Admitting: Family Medicine

## 2021-10-24 ENCOUNTER — Other Ambulatory Visit: Payer: Self-pay

## 2021-10-24 ENCOUNTER — Encounter (HOSPITAL_COMMUNITY): Payer: Self-pay | Admitting: Emergency Medicine

## 2021-10-24 DIAGNOSIS — K625 Hemorrhage of anus and rectum: Secondary | ICD-10-CM | POA: Diagnosis not present

## 2021-10-24 DIAGNOSIS — K921 Melena: Secondary | ICD-10-CM | POA: Diagnosis not present

## 2021-10-24 DIAGNOSIS — K5900 Constipation, unspecified: Secondary | ICD-10-CM

## 2021-10-24 LAB — POC OCCULT BLOOD, ED: Fecal Occult Bld: POSITIVE — AB

## 2021-10-24 MED ORDER — ONDANSETRON HCL 4 MG PO TABS: 4.0000 mg | ORAL_TABLET | Freq: Four times a day (QID) | ORAL | 0 refills | Status: DC

## 2021-10-24 NOTE — ED Provider Notes (Signed)
[name] ?Arrival date & time: 10/24/21  1251 ? ? ?  ? ?History   ?Chief Complaint ?Chief Complaint  ?Patient presents with  ? Abdominal Pain  ? Rectal Bleeding  ? ? ?HPI ?Felicia Perkins is a 46 y.o. female.  ? ?About 2 weeks ago she noted she wasn't going to the bathroom (bowel movement).  She used some maalox with some help, but couldn't go on her own.   ?She then started with abdominal pain, nausea, blood in her stool.  She is going to the bathroom more often, with just a little bit of stool, but more blood.   Blood is in the water, stool, on the toilet paper.  No blood in between bms.   ?She has to drink coffee to get a bm.  She just bought metamucil, but has not used it yet.   ?She still has abd pain at the epigastric area, worse after eating.  ?Her last bm 2 hours ago.  Small pebbles.  She just cannot go on her own.   ?She has to sit there x 10 mins, trying not to strain, but she feels that she has to go.  ? ?Past Medical History:  ?Diagnosis Date  ? Abdominal pain, epigastric 12/16/2014  ? Acute on chronic pancreatitis ([place]) 03/22/2019  ? Acute pancreatitis   ? Alcohol abuse   ? Alcohol dependence ([place]) 12/16/2012  ? Medical detox successful   ? Alcohol induced acute pancreatitis 11/22/2017  ? Alcoholic cirrhosis of liver without ascites ([place]) 10/15/2016  ? Anxiety   ? Bipolar 1 disorder ([place])   ? Cancer [facility])   ? cervical cancer  ? Chest pain with moderate risk for cardiac etiology 12/15/2014  ? Cirrhosis ([place])   ? COPD (chronic obstructive pulmonary disease) ([place]) 09/04/2016  ? Coronary artery disease   ? Depression   ? Duodenitis 11/22/2017  ? Eczema 10/09/2018  ? Elevated liver enzymes 12/22/2014  ? Gastritis and gastroduodenitis 09/04/2016  ? GERD (gastroesophageal reflux disease)   ? Nondisplaced fracture of neck of fifth metacarpal bone, right hand, initial encounter for closed fracture 08/26/2017  ? Pancreatitis   ? 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 PTSD (post-traumatic stress  disorder)   ? Tobacco abuse 09/04/2016  ? Tobacco abuse counseling   ? Vascular disease   ? ? ?Patient Active Problem List  ? Diagnosis Date Noted  ? Hypertension 03/16/2021  ? Smoker 03/16/2021  ? History of carotid endarterectomy 03/16/2021  ? Coronary artery disease 03/02/2021  ? Depression 03/02/2021  ? Carotid artery stenosis 01/26/2021  ? Anxiety   ? Cancer [facility])   ? Cirrhosis ([place])   ? GERD (gastroesophageal reflux disease)   ? Pancreatitis   ? PTSD (post-traumatic stress disorder)   ? Vascular disease   ? Acute on chronic pancreatitis ([place]) 03/22/2019  ? Bipolar 1 disorder ([place]) 01/13/2019  ? Eczema 10/09/2018  ? Acute pancreatitis   ? Alcohol induced acute pancreatitis 11/22/2017  ? Duodenitis 11/22/2017  ? Tobacco abuse counseling   ? 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 Nondisplaced fracture of neck of fifth metacarpal bone, right hand, initial encounter for closed fracture 08/26/2017  ? Alcoholic cirrhosis of liver without ascites ([place]) 10/15/2016  ? Alcohol abuse 09/04/2016  ? Gastritis and gastroduodenitis 09/04/2016  ? Tobacco abuse 09/04/2016  ? COPD (chronic obstructive pulmonary disease) ([place]) 09/04/2016  ? Elevated liver enzymes 12/22/2014  ? Abdominal pain, epigastric 12/16/2014  ? Chest pain with moderate risk for  cardiac etiology 12/15/2014  ? Alcohol dependence ([place]) 12/16/2012  ?  Class: Acute  ? ? ?Past Surgical History:  ?Procedure Laterality Date  ? ABDOMINAL HYSTERECTOMY  2004  ? BIOPSY  01/02/2018  ? Procedure: BIOPSY;  Surgeon: Milus Banister, MD;  Location: [facility];  Service: Endoscopy;;  ? ENDARTERECTOMY Right 01/26/2021  ? Procedure: RIGHT CAROTID ARTERY ENDARTERECTOMY;  Surgeon: Waynetta Sandy, MD;  Location: [place];  Service: Vascular;  Laterality: Right;  ? ESOPHAGOGASTRODUODENOSCOPY (EGD) WITH PROPOFOL N/A 01/02/2018  ? Procedure: ESOPHAGOGASTRODUODENOSCOPY (EGD) WITH PROPOFOL;  Surgeon: Milus Banister, MD;  Location: [facility];  Service: Endoscopy;  Laterality:  N/A;  ? EUS N/A 01/02/2018  ? Procedure: UPPER ENDOSCOPIC ULTRASOUND (EUS) RADIAL;  Surgeon: Milus Banister, MD;  Location: [facility];  Service: Endoscopy;  Laterality: N/A;  ? FOOT SURGERY    ? OTHER SURGICAL HISTORY    ? ovarian surgery  ? TUBAL LIGATION    ? ? ?OB History   ?No obstetric history on file. ?  ? ? ? ?Home Medications   ? ?Prior to Admission medications   ?Medication Sig Start Date End Date Taking? Authorizing Provider  ?albuterol (VENTOLIN HFA) 108 (90 Base) MCG/ACT inhaler Inhale 1-2 puffs into the lungs every 6 (six) hours as needed for wheezing or shortness of breath. 04/01/20   Charlott Rakes, MD  ?amLODipine (NORVASC) 2.5 MG tablet Take 1 tablet (2.5 mg total) by mouth daily. 03/15/21 06/22/21  Tobb, Godfrey Pick, DO  ?clopidogrel (PLAVIX) 75 MG tablet Take 1 tablet (75 mg total) by mouth daily. 11/18/20   Waynetta Sandy, MD  ?famotidine (PEPCID) 20 MG tablet Take 1 tablet (20 mg total) by mouth 2 (two) times daily. ?Patient taking differently: Take 20 mg by mouth daily. 08/24/20   Argentina Donovan, PA-C  ?hydrOXYzine (ATARAX/VISTARIL) 25 MG tablet Take 25 mg by mouth daily as needed for itching. 12/09/20   [provider]  ?lidocaine (XYLOCAINE) 2 % solution Use as directed 15 mLs in the mouth or throat every 6 (six) hours as needed for mouth pain. 06/23/21   Cardama, Grayce Sessions, MD  ?mometasone-formoterol (DULERA) 200-5 MCG/ACT AERO TAKE 2 PUFFS BY MOUTH TWICE A DAY ?Patient taking differently: Inhale 2 puffs into the lungs daily as needed. 05/29/21   Charlott Rakes, MD  ?nicotine (NICODERM CQ - DOSED IN MG/24 HOURS) 14 mg/24hr patch Place 1 patch (14 mg total) onto the skin daily. ?Patient not taking: Reported on 06/22/2021 01/27/21 01/27/22  Karoline Caldwell, PA-C  ?nitroGLYCERIN (NITROSTAT) 0.4 MG SL tablet Place 1 tablet (0.4 mg total) under the tongue every 5 (five) minutes as needed for chest pain. Up to 3 times. 12/01/20 06/22/21  Tobb, Kardie, DO  ?pantoprazole (PROTONIX)  40 MG tablet TAKE 1 TABLET BY MOUTH TWICE A DAY 09/27/21   Charlott Rakes, MD  ?rosuvastatin (CRESTOR) 10 MG tablet Take 1 tablet (10 mg total) by mouth daily. 11/18/20   Waynetta Sandy, MD  ?sucralfate (CARAFATE) 1 GM/10ML suspension Take 10 mLs (1 g total) by mouth 2 (two) times daily. 06/23/21   Fatima Blank, MD  ?cloNIDine (CATAPRES) 0.1 MG tablet Take 1 tablet (0.1 mg total) by mouth at bedtime. For hot flashes ?Patient not taking: Reported on 10/17/2019 01/13/19 08/15/20  Charlott Rakes, MD  ? ? ?Family History ?Family History  ?Problem Relation Age of Onset  ? Hypertension Mother   ? Breast cancer Mother 46  ? Pancreatitis Mother   ? Hypertension Father   ?  Heart attack Cousin 24  ?     died in 32s of MI  ? Breast cancer Maternal Aunt   ? Breast cancer Maternal Aunt   ? Colon cancer Neg Hx   ? Esophageal cancer Neg Hx   ? Rectal cancer Neg Hx   ? Stomach cancer Neg Hx   ? ? ?Social History ?Social History  ? ?Tobacco Use  ? Smoking status: Every Day  ?  Packs/day: 0.50  ?  Types: Cigarettes  ? Smokeless tobacco: Never  ? Tobacco comments:  ?  2-3 cigarettes daily  ?Vaping Use  ? Vaping Use: Never used  ?Substance Use Topics  ? Alcohol use: Yes  ?  Comment: 05/06/21  ? Drug use: No  ? ? ? ?Allergies   ?Patient has no known allergies. ? ? ?Review of Systems ?Review of Systems  ?Constitutional: Negative.   ?HENT: Negative.    ?Respiratory: Negative.    ?Cardiovascular: Negative.   ?Gastrointestinal:  Positive for abdominal pain, blood in stool and nausea.  ?Genitourinary: Negative.   ? ? ?Physical Exam ?Triage Vital Signs ?ED Triage Vitals  ?Enc Vitals Group  ?   BP 10/24/21 1401 116/63  ?   Pulse Rate 10/24/21 1401 71  ?   Resp 10/24/21 1401 18  ?   Temp 10/24/21 1401 98.5 ?F (36.9 ?C)  ?   Temp Source 10/24/21 1401 Oral  ?   SpO2 10/24/21 1401 96 %  ?   Weight 10/24/21 1400 99 lb 3.3 oz (45 kg)  ?   Height 10/24/21 1400 '5\' 4"'$  (1.626 m)  ?   Head Circumference --   ?   Peak Flow --   ?   Pain Score  10/24/21 1400 7  ?   Pain Loc --   ?   Pain Edu? --   ?   Excl. in [place]? --   ? ?No data found. ? ?Updated Vital Signs ?BP 116/63 (BP Location: Left Arm)   Pulse 71   Temp 98.5 ?F (36.9 ?C) (Oral)   Resp

## 2021-10-24 NOTE — Discharge Instructions (Addendum)
You were seen today for constipation, abdominal pain, and blood in your stool.  ?For the constipation you may take OTC mirilax daily to see if helps.  ?You do have blood and tenderness at your rectal area today.  You do have hemorrhoids, but I don't think these are the culprit.  I don't think this is severe enough to go to the ER today.  I recommend you call your gastroenterologist to see about an appointment.  If they cannot see you soon, or you have worsening pain or rectal bleeding, then please go to the ER for further evaluation.  ? ?

## 2021-10-24 NOTE — ED Triage Notes (Signed)
Pt reports rectal bleeding beginning 2 days ago. Pt states she had constipation a couple weeks ago and this is still ongoing.  ?

## 2021-10-25 ENCOUNTER — Other Ambulatory Visit: Payer: Self-pay | Admitting: Family Medicine

## 2021-10-25 DIAGNOSIS — Z1231 Encounter for screening mammogram for malignant neoplasm of breast: Secondary | ICD-10-CM

## 2021-10-27 DIAGNOSIS — Z1231 Encounter for screening mammogram for malignant neoplasm of breast: Secondary | ICD-10-CM

## 2021-11-01 ENCOUNTER — Encounter: Payer: Self-pay | Admitting: Family Medicine

## 2021-11-01 ENCOUNTER — Ambulatory Visit: Payer: Medicaid Other | Attending: Family Medicine | Admitting: Family Medicine

## 2021-11-01 ENCOUNTER — Other Ambulatory Visit: Payer: Self-pay

## 2021-11-01 DIAGNOSIS — J302 Other seasonal allergic rhinitis: Secondary | ICD-10-CM

## 2021-11-01 DIAGNOSIS — K649 Unspecified hemorrhoids: Secondary | ICD-10-CM

## 2021-11-01 DIAGNOSIS — Z9889 Other specified postprocedural states: Secondary | ICD-10-CM

## 2021-11-01 DIAGNOSIS — J438 Other emphysema: Secondary | ICD-10-CM

## 2021-11-01 DIAGNOSIS — K7030 Alcoholic cirrhosis of liver without ascites: Secondary | ICD-10-CM

## 2021-11-01 MED ORDER — CETIRIZINE HCL 10 MG PO TABS: 10.0000 mg | ORAL_TABLET | Freq: Every day | ORAL | 1 refills | Status: DC

## 2021-11-01 MED ORDER — ALBUTEROL SULFATE HFA 108 (90 BASE) MCG/ACT IN AERS: 1.0000 | INHALATION_SPRAY | Freq: Four times a day (QID) | RESPIRATORY_TRACT | 2 refills | Status: DC | PRN

## 2021-11-01 MED ORDER — FLUTICASONE PROPIONATE 50 MCG/ACT NA SUSP: 2.0000 | Freq: Every day | NASAL | 6 refills | Status: DC

## 2021-11-01 MED ORDER — OLOPATADINE HCL 0.1 % OP SOLN: 1.0000 [drp] | Freq: Two times a day (BID) | OPHTHALMIC | 6 refills | Status: DC

## 2021-11-01 MED ORDER — DULERA 200-5 MCG/ACT IN AERO: 2.0000 | INHALATION_SPRAY | Freq: Every day | RESPIRATORY_TRACT | 3 refills | Status: DC | PRN

## 2021-11-01 NOTE — Progress Notes (Signed)
? ?Virtual Visit via Telephone Note ? ?I connected with [name], on 11/01/2021 at 1:47 PM by telephone due to the COVID-19 pandemic and verified that I am speaking with the correct person using two identifiers. ?  ?Consent: ?I discussed the limitations, risks, security and privacy concerns of performing an evaluation and management service by telephone and the availability of in person appointments. I also discussed with the patient that there may be a patient responsible charge related to this service. The patient expressed understanding and agreed to proceed. ? ? ?Location of Patient: ?Home ? ?Location of Provider: ?Clinic ? ? ?Persons participating in Telemedicine visit: ?ROWENA MOILANEN ?Dr. Margarita Rana ?Medical Student ? ? ? ? ?History of Present Illness: ?Felicia Perkins is a 46 y.o. year old female with a history of alcoholic liver cirrhosis, COPD,  R ICA stenosis (s/p R CEA in 01/2021) stenosis alcohol abuse, tobacco abuse ?She underwent right carotid endarterectomy in 01/2021 due to right ICA stenosis and is currently on Plavix, statin, amlodipine and is followed by cardiology, Dr. Jorene Minors. ?Complains of eyes tearing, eye redness and rhinorrhea. She works at a [employer]. She has sneezing, headaches behind her eyes and her throat gets  a little scratchy. She has no fever or body aches. Using OTC allergy medications which have been ineffective denies presence of chest pain or dyspnea. ?She is using her MDI a little bit more and using Dulera prn due to her symptoms. ? ?She had an UC visit 1 week ago for rectal bleeding and was found to have external hemorrhoids on exam.  A CBC was not done. ?Still has hematochezia but this is lighter now. She endorses being constipated and was informed she needed to use Miralax OTC ? ?Attends AA meetings once/week. She has had 2 slip ups but overall is working on quitting alcohol consumption. ?Smokes 1-2 cig/day ?Past Medical History:  ?Diagnosis Date  ? Abdominal  pain, epigastric 12/16/2014  ? Acute on chronic pancreatitis ([place]) 03/22/2019  ? Acute pancreatitis   ? Alcohol abuse   ? Alcohol dependence ([place]) 12/16/2012  ? Medical detox successful   ? Alcohol induced acute pancreatitis 11/22/2017  ? Alcoholic cirrhosis of liver without ascites ([place]) 10/15/2016  ? Anxiety   ? Bipolar 1 disorder ([place])   ? Cancer [facility])   ? cervical cancer  ? Chest pain with moderate risk for cardiac etiology 12/15/2014  ? Cirrhosis ([place])   ? COPD (chronic obstructive pulmonary disease) ([place]) 09/04/2016  ? Coronary artery disease   ? Depression   ? Duodenitis 11/22/2017  ? Eczema 10/09/2018  ? Elevated liver enzymes 12/22/2014  ? Gastritis and gastroduodenitis 09/04/2016  ? GERD (gastroesophageal reflux disease)   ? Nondisplaced fracture of neck of fifth metacarpal bone, right hand, initial encounter for closed fracture 08/26/2017  ? Pancreatitis   ? 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 PTSD (post-traumatic stress disorder)   ? Tobacco abuse 09/04/2016  ? Tobacco abuse counseling   ? Vascular disease   ? ?No Known Allergies ? ?Current Outpatient Medications on File Prior to Visit  ?Medication Sig Dispense Refill  ? albuterol (VENTOLIN HFA) 108 (90 Base) MCG/ACT inhaler Inhale 1-2 puffs into the lungs every 6 (six) hours as needed for wheezing or shortness of breath. 8.5 g 2  ? amLODipine (NORVASC) 2.5 MG tablet Take 1 tablet (2.5 mg total) by mouth daily. 90 tablet 3  ? clopidogrel (PLAVIX) 75 MG tablet Take 1 tablet (75 mg total) by mouth daily. [place]  tablet 11  ? famotidine (PEPCID) 20 MG tablet Take 1 tablet (20 mg total) by mouth 2 (two) times daily. (Patient taking differently: Take 20 mg by mouth daily.) 60 tablet 2  ? hydrOXYzine (ATARAX/VISTARIL) 25 MG tablet Take 25 mg by mouth daily as needed for itching.    ? lidocaine (XYLOCAINE) 2 % solution Use as directed 15 mLs in the mouth or throat every 6 (six) hours as needed for mouth pain. 150 mL 0  ? mometasone-formoterol (DULERA) 200-5 MCG/ACT AERO TAKE 2 PUFFS BY  MOUTH TWICE A DAY (Patient taking differently: Inhale 2 puffs into the lungs daily as needed.) 13 each 0  ? nitroGLYCERIN (NITROSTAT) 0.4 MG SL tablet Place 1 tablet (0.4 mg total) under the tongue every 5 (five) minutes as needed for chest pain. Up to 3 times. 90 tablet 3  ? ondansetron (ZOFRAN) 4 MG tablet Take 1 tablet (4 mg total) by mouth every 6 (six) hours. 12 tablet 0  ? pantoprazole (PROTONIX) 40 MG tablet TAKE 1 TABLET BY MOUTH TWICE A DAY 20 tablet 0  ? rosuvastatin (CRESTOR) 10 MG tablet Take 1 tablet (10 mg total) by mouth daily. 30 tablet 11  ? sucralfate (CARAFATE) 1 GM/10ML suspension Take 10 mLs (1 g total) by mouth 2 (two) times daily. 420 mL 0  ? [DISCONTINUED] cloNIDine (CATAPRES) 0.1 MG tablet Take 1 tablet (0.1 mg total) by mouth at bedtime. For hot flashes (Patient not taking: Reported on 10/17/2019) 30 tablet 3  ? ?No current facility-administered medications on file prior to visit.  ? ? ?ROS: ?See HPI ? ?Observations/Objective: ?Awake, alert, oriented x3 ?Not in acute distress ?Normal mood ? ? ? ?  Latest Ref Rng & Units 06/22/2021  ?  3:17 PM 01/30/2021  ?  9:28 AM 01/27/2021  ?  5:00 AM  ?CMP  ?Glucose 70 - 99 mg/dL 120   100   113    ?BUN 6 - 20 mg/dL <5   <5   4    ?Creatinine 0.44 - 1.00 mg/dL 0.54   0.43   0.54    ?Sodium 135 - 145 mmol/L 137   138   136    ?Potassium 3.5 - 5.1 mmol/L 3.4   3.7   4.8    ?Chloride 98 - 111 mmol/L 104   102   101    ?CO2 22 - 32 mmol/L 25   28   25     ?Calcium 8.9 - 10.3 mg/dL 9.2   9.3   9.3    ?Total Protein 6.5 - 8.1 g/dL 6.5   6.4     ?Total Bilirubin 0.3 - 1.2 mg/dL 0.8   0.8     ?Alkaline Phos 38 - 126 U/L 107   139     ?AST 15 - 41 U/L 27   126     ?ALT 0 - 44 U/L 24   97     ? ? ?Lipid Panel  ?   ?Component Value Date/Time  ? CHOL 240 (H) 03/22/2019 1428  ? TRIG 90 03/22/2019 1428  ? HDL 65 03/22/2019 1428  ? CHOLHDL 3.7 03/22/2019 1428  ? VLDL 18 03/22/2019 1428  ? LDLCALC 157 (H) 03/22/2019 1428  ? ? ?Lab Results  ?Component Value Date  ? HGBA1C  4.6 08/25/2019  ? ? ?Assessment and Plan: ?1. Hemorrhoids, unspecified hemorrhoid type ?Could explain lower GI bleed ?Given history of cirrhosis we will need to also exclude sequela of cirrhosis ?She is also  due for colonoscopy ?- CBC with Differential/Platelet; Future ?- Ambulatory referral to Gastroenterology ? ?2. Other emphysema ([place]) ?Worsened by onset of pollen ?She has been using her inhalers incorrectly and has been educated to use the [facility] as maintenance therapy and albuterol as needed ?- albuterol (VENTOLIN HFA) 108 (90 Base) MCG/ACT inhaler; Inhale 1-2 puffs into the lungs every 6 (six) hours as needed for wheezing or shortness of breath.  Dispense: 8.5 g; Refill: 2 ?- mometasone-formoterol (DULERA) 200-5 MCG/ACT AERO; Inhale 2 puffs into the lungs daily as needed.  Dispense: 13 g; Refill: 3 ? ?3. Alcoholic cirrhosis of liver without ascites ([place]) ?She is working on quitting alcohol ?- CMP14+EGFR; Future ?- Ambulatory referral to Gastroenterology ?- LP+Non-HDL Cholesterol; Future ? ?4. Seasonal allergies ?Uncontrolled ?We will place on antihistamine ?- cetirizine (ZYRTEC) 10 MG tablet; Take 1 tablet (10 mg total) by mouth daily.  Dispense: 30 tablet; Refill: 1 ?- fluticasone (FLONASE) 50 MCG/ACT nasal spray; Place 2 sprays into both nostrils daily.  Dispense: 16 g; Refill: 6 ?- olopatadine (PATANOL) 0.1 % ophthalmic solution; Place 1 drop into both eyes 2 (two) times daily.  Dispense: 5 mL; Refill: 6 ? ?5. S/P carotid endarterectomy ?Secondary to right ICA stenosis ?Stable ?Continue risk factor modification including smoking cessation ?Continue statin, Plavix ? ? ?Follow Up Instructions: ?3 months ?  ?I discussed the assessment and treatment plan with the patient. The patient was provided an opportunity to ask questions and all were answered. The patient agreed with the plan and demonstrated an understanding of the instructions. ?  ?The patient was advised to call back or seek an in-person evaluation if  the symptoms worsen or if the condition fails to improve as anticipated. ? ? ? ? ?I provided 15 minutes total of non-face-to-face time during this encounter. ? ? ?Charlott Rakes, MD, FAAFP. ?[place]

## 2021-11-02 ENCOUNTER — Telehealth: Payer: Self-pay | Admitting: Family Medicine

## 2021-11-02 DIAGNOSIS — Q742 Other congenital malformations of lower limb(s), including pelvic girdle: Secondary | ICD-10-CM

## 2021-11-02 NOTE — Telephone Encounter (Signed)
Needs referral, pt states that her pinky toe has turned the opposite direction. ?

## 2021-11-02 NOTE — Telephone Encounter (Signed)
Copied from [place] [id number]. Topic: Referral - Question >> Nov 02, 2021 12:32 PM Pawlus, Brayton Layman A wrote: Reason for CRM: Pt stated she was just seen on 3/22 and was needing a referral to see a foot doctor since she is still having issues with her toe, please advise .

## 2021-11-03 NOTE — Telephone Encounter (Signed)
Pt was called and informed of referral being placed. ?

## 2021-11-03 NOTE — Telephone Encounter (Signed)
Done

## 2021-11-06 ENCOUNTER — Other Ambulatory Visit: Payer: Self-pay

## 2021-11-06 ENCOUNTER — Ambulatory Visit: Payer: Medicaid Other | Attending: Family Medicine

## 2021-11-06 DIAGNOSIS — K7030 Alcoholic cirrhosis of liver without ascites: Secondary | ICD-10-CM

## 2021-11-06 DIAGNOSIS — K649 Unspecified hemorrhoids: Secondary | ICD-10-CM

## 2021-11-07 LAB — CMP14+EGFR
ALT: 49 IU/L — ABNORMAL HIGH (ref 0–32)
AST: 81 IU/L — ABNORMAL HIGH (ref 0–40)
Albumin/Globulin Ratio: 1.9 (ref 1.2–2.2)
Albumin: 4.7 g/dL (ref 3.8–4.8)
Alkaline Phosphatase: 161 IU/L — ABNORMAL HIGH (ref 44–121)
BUN/Creatinine Ratio: 10 (ref 9–23)
BUN: 5 mg/dL — ABNORMAL LOW (ref 6–24)
Bilirubin Total: 0.2 mg/dL (ref 0.0–1.2)
CO2: 24 mmol/L (ref 20–29)
Calcium: 9.7 mg/dL (ref 8.7–10.2)
Chloride: 98 mmol/L (ref 96–106)
Creatinine, Ser: 0.5 mg/dL — ABNORMAL LOW (ref 0.57–1.00)
Globulin, Total: 2.5 g/dL (ref 1.5–4.5)
Glucose: 66 mg/dL — ABNORMAL LOW (ref 70–99)
Potassium: 3.4 mmol/L — ABNORMAL LOW (ref 3.5–5.2)
Sodium: 138 mmol/L (ref 134–144)
Total Protein: 7.2 g/dL (ref 6.0–8.5)
eGFR: 117 mL/min/{1.73_m2} (ref >59)

## 2021-11-07 LAB — LP+NON-HDL CHOLESTEROL
Cholesterol, Total: 192 mg/dL (ref 100–199)
HDL: 79 mg/dL (ref >39)
LDL Chol Calc (NIH): 83 mg/dL (ref 0–99)
Total Non-HDL-Chol (LDL+VLDL): 113 mg/dL (ref 0–129)
Triglycerides: 180 mg/dL — ABNORMAL HIGH (ref 0–149)
VLDL Cholesterol Cal: 30 mg/dL (ref 5–40)

## 2021-11-07 LAB — CBC WITH DIFFERENTIAL/PLATELET
Basophils Absolute: 0.1 10*3/uL (ref 0.0–0.2)
Basos: 1 %
EOS (ABSOLUTE): 0.1 10*3/uL (ref 0.0–0.4)
Eos: 2 %
Hematocrit: 38.7 % (ref 34.0–46.6)
Hemoglobin: 12.6 g/dL (ref 11.1–15.9)
Immature Grans (Abs): 0 10*3/uL (ref 0.0–0.1)
Immature Granulocytes: 0 %
Lymphocytes Absolute: 3.2 10*3/uL — ABNORMAL HIGH (ref 0.7–3.1)
Lymphs: 41 %
MCH: 33 pg (ref 26.6–33.0)
MCHC: 32.6 g/dL (ref 31.5–35.7)
MCV: 101 fL — ABNORMAL HIGH (ref 79–97)
Monocytes Absolute: 0.6 10*3/uL (ref 0.1–0.9)
Monocytes: 8 %
Neutrophils Absolute: 3.7 10*3/uL (ref 1.4–7.0)
Neutrophils: 48 %
Platelets: 244 10*3/uL (ref 150–450)
RBC: 3.82 x10E6/uL (ref 3.77–5.28)
RDW: 13.1 % (ref 11.7–15.4)
WBC: 7.8 10*3/uL (ref 3.4–10.8)

## 2021-11-16 ENCOUNTER — Ambulatory Visit: Payer: Medicaid Other | Admitting: Podiatry

## 2021-11-16 ENCOUNTER — Ambulatory Visit (INDEPENDENT_AMBULATORY_CARE_PROVIDER_SITE_OTHER): Payer: Medicaid Other

## 2021-11-16 DIAGNOSIS — M21621 Bunionette of right foot: Secondary | ICD-10-CM

## 2021-11-16 DIAGNOSIS — M2041 Other hammer toe(s) (acquired), right foot: Secondary | ICD-10-CM

## 2021-11-19 NOTE — Progress Notes (Signed)
?  Subjective:  ?Patient ID: Felicia Perkins, female    DOB: 10/09/1975,  MRN: [id number] ? ?Chief Complaint  ?Patient presents with  ? Bunions  ?     (np) re-established// right foot-Abnormally small toe/preferred to see another provider  ? ? ?46 y.o. female presents with the above complaint. History confirmed with patient.  She returns for follow-up today and to establish care with me for ongoing right foot issues.  She has a history of multiple right foot and left foot surgeries.  Most recently this included a tailor's bunionectomy with Dr. Amalia Hailey which has been successful for her.  Her right foot has hammertoe deformities of the lesser toes with curling under of the toes the worst of this is the fourth toe.  She is working on quitting smoking and is using patches. ? ?Objective:  ?Physical Exam: ?warm, good capillary refill, no trophic changes or ulcerative lesions, normal DP and PT pulses, normal sensory exam, and on the right foot she has a well-healed surgical scar on the fourth and fifth toes as well as the fifth metatarsal, there is abduction deformity and flail toe of the fourth toe and reducible hammertoes of the second and third toes.. ? ? ?Radiographs: ?Multiple views x-ray of the right foot: Prior tailor's bunion osteotomy with wire fixation looks like there is previous work on the fourth and fifth toes as well ?Assessment:  ? ?1. Acquired hammertoe of right foot   ? ? ? ?Plan:  ?Patient was evaluated and treated and all questions answered. ? ?I discussed etiology and treatment options of the hammertoe deformities in detail.  We discussed nonsurgical treatment with wider shoes and padding.  So far she has tried these and has not been successful.  It appears that she had an arthroplasty of the fourth toe and that this is led to an increased adductovarus and instability of the toe.  I discussed surgical correction with her including PIPJ arthrodesis of the second third and fourth toes.  She would like to  proceed with this.  We discussed the risks and benefits and potential complications including but not limited to pain, swelling, infection, scar, numbness which may be temporary or permanent, chronic pain, stiffness, nerve pain or damage, wound healing problems, bone healing problems including delayed or non-union.  She understands and wishes to proceed.  No guarantees as to the outcomes of surgery was made.  All questions were addressed.  Informed sent was signed and reviewed.  I did advise her to continue to stop smoking before and after surgery to reduce her risk of complications and she will work on this. ? ? ? ?Surgical plan: ? ?Procedure: ?-Hammertoe correction 2, 3, 4 right foot ? ?Location: ?-[facility] ? ?Anesthesia plan: ?-IV sedation with local ? ?Postoperative pain plan: ?- Tylenol 1000 mg every 6 hours, ibuprofen 600 mg every 6 hours, gabapentin 300 mg every 8 hours x5 days, oxycodone 5 mg 1-2 tabs every 6 hours only as needed ? ?DVT prophylaxis: ?-None required ? ?WB Restrictions / DME needs: ?-WBAT in postop shoe that will be dispensed at surgical center ? ?No follow-ups on file.  ? ?

## 2021-11-20 ENCOUNTER — Other Ambulatory Visit: Payer: Self-pay | Admitting: Vascular Surgery

## 2021-11-21 ENCOUNTER — Other Ambulatory Visit: Payer: Self-pay | Admitting: Vascular Surgery

## 2021-12-01 ENCOUNTER — Other Ambulatory Visit (INDEPENDENT_AMBULATORY_CARE_PROVIDER_SITE_OTHER): Payer: Medicaid Other

## 2021-12-01 ENCOUNTER — Ambulatory Visit (INDEPENDENT_AMBULATORY_CARE_PROVIDER_SITE_OTHER): Payer: Medicaid Other | Admitting: Nurse Practitioner

## 2021-12-01 ENCOUNTER — Encounter: Payer: Self-pay | Admitting: Nurse Practitioner

## 2021-12-01 ENCOUNTER — Encounter: Payer: Self-pay | Admitting: Family Medicine

## 2021-12-01 ENCOUNTER — Telehealth: Payer: Self-pay

## 2021-12-01 VITALS — BP 114/74 | HR 83 | Ht 64.5 in | Wt 101.1 lb

## 2021-12-01 DIAGNOSIS — K860 Alcohol-induced chronic pancreatitis: Secondary | ICD-10-CM

## 2021-12-01 DIAGNOSIS — K7030 Alcoholic cirrhosis of liver without ascites: Secondary | ICD-10-CM

## 2021-12-01 DIAGNOSIS — K5900 Constipation, unspecified: Secondary | ICD-10-CM

## 2021-12-01 LAB — IBC PANEL
Iron: 44 ug/dL (ref 42–145)
Saturation Ratios: 15 % — ABNORMAL LOW (ref 20.0–50.0)
TIBC: 294 ug/dL (ref 250.0–450.0)
Transferrin: 210 mg/dL — ABNORMAL LOW (ref 212.0–360.0)

## 2021-12-01 LAB — PROTIME-INR
INR: 1.1 ratio — ABNORMAL HIGH (ref 0.8–1.0)
Prothrombin Time: 12 s (ref 9.6–13.1)

## 2021-12-01 LAB — FERRITIN: Ferritin: 74.6 ng/mL (ref 10.0–291.0)

## 2021-12-01 MED ORDER — PLENVU 140 G PO SOLR: 1.0000 | Freq: Once | ORAL | 0 refills | Status: AC

## 2021-12-01 NOTE — Patient Instructions (Signed)
If you are age 47 or older, your body mass index should be between 23-30. Your Body mass index is 17.09 kg/m?Marland Kitchen If this is out of the aforementioned range listed, please consider follow up with your Primary Care Provider. ? ?If you are age 34 or younger, your body mass index should be between 19-25. Your Body mass index is 17.09 kg/m?Marland Kitchen If this is out of the aformentioned range listed, please consider follow up with your Primary Care Provider.  ? ?________________________________________________________ ? ?The [facility] providers would like to encourage you to use [facility] to communicate with providers for non-urgent requests or questions.  Due to long hold times on the telephone, sending your provider a message by [facility] may be a faster and more efficient way to get a response.  Please allow 48 business hours for a response.  Please remember that this is for non-urgent requests.  ?_______________________________________________________ ? ?Your provider has requested that you go to the basement level for lab work before leaving today. Press "B" on the elevator. The lab is located at the first door on the left as you exit the elevator. ? ?You will be contacted by [facility] in the next 2 days to arrange a RUQ abdominal ultrasound.  The number on your caller ID will be [phone], please answer when they call.  If you have not heard from them in 2 days please call [phone] to schedule.  ? ?Continue Miralax and Benefiber  ? ?

## 2021-12-01 NOTE — Progress Notes (Signed)
? ? ? ?Assessment  ? ?Patient Profile:  ?Felicia Perkins is a 46 y.o. female known to Dr. Hilarie Perkins with a past medical history of chronic Etoh pancreatitis, Etoh abuse, HTN, carotid artery disease s/p right carotid endarterectomy June 2022 on plavix, chronic constipation, chronic non-H. pylori related gastritis. Additional medical history as listed in [place] .  Referred by PCP for alcoholic cirrhosis and hemorrhoids.  ? ?Possible cirrhosis without evidence of portal hypertension on CT scan in March 2022.  ? ?Chronic Etoh pancreatitis.  Most recent CT scan March 2022 shows calcifications and a small amount of ill-defined fluid about the head and neck of the pancreas. ? ?Chronic mid upper abdominal pain worse after PO intake. Negative EGD for the pain in March 2021. Pain related to chronic pancreatitis?  ?  ?Chronic constipation. Has urge to defecate every day but difficult evacuation requiring straining.  She has been taking Miralax daily for the last month. Drinks several glasses of water daily ? ? Rectal bleeding ( for one week), small volume. Seen in ED for blood in stool. DRE was positive for overt blood. In setting of constipation the bleeding is probably hemorrhoidal. ? ?Colon cancer screening.  No Felicia Perkins of colon cancer. Not anemic.  ? ? Carotid artery disease, status post right endarterectomy. on Plavix. Hold Plavix for 5 days before procedure - will instruct when and how to resume after procedure. Patient understands that there is a low but real risk of cardiovascular event such as heart attack, stroke, or embolism /  thrombosis, or ischemia while off Plavix. The patient consents to proceed. Will communicate by phone or EMR with patient's prescribing provider to confirm that holding Plavix is reasonable in this case.  ? ? ?Plan  ? ?Obtain complete hepatic serologic workup to evaluate causes of cirrhosis other than alcohol ?She has had recent labs, will check INR ?Vaccinate for HAV and HBV if indicated ?[place]  screening:  Obtain RUQ [place], AFP ?Varices screening. No varices / portal hypertensive gastropathy on EGD in March 2021. I did not schedule repeat EGD but will do so if Dr. Hilarie Perkins feels it is needed at this time ?Schedule for a colonoscopy. The risks and benefits of colonoscopy with possible polypectomy / biopsies were discussed and the patient agrees to proceed.  ?Stop ALL ETOH. Congratulations on cutting back over the last year but need total abstinence.  ?Continue daily Miralax. Add daily Benefiber.  ? ? ?History of Present Illness  ? ?Chief Complaint : rectal bleeding, cirrhosis ? ?10/14/19 Office visit.  HPI from that visit:  ?Felicia Perkins is a 46 year old female with history of alcoholic pancreatitis in April 2019.  CT scan showed peripancreatic around the head and tail of pancreas but also a small enhancing rim structure medial to D2 raising concern for penetrating duodenal ulcer.  Follow up  MRI  12/17/17 showed a hypervascular lesion in the head of the pancreas contiguous with adjacent duodenum.  She underwent an endoscopic ultrasound by Dr. Ardis Hughs 01/02/18.  Poor imaging of the ampulla/proximal pancreatic head due to inability to maneuver the echoendoscope through the duodenal bulb but no clear masses seen.  Suspicion was for an NSAID related ulcer of the duodenum which had since healed.  Follow-up CT scan recommended for 3 months which patient had done 03/21/2018 with findings of interval resolution of the enhancing lesion in the head of pancreas.  Of note, patient rehospitalized mid August 2020 with abdominal pain.  CT scan w/  03/22/2019 with findings of inflammatory changes  surrounding the pancreatic head associated with underlying parenchymal calcifications consistent with acute on chronic pancreatitis.  She has since come back to the ED in December for abdominal pain ? Felicia Perkins is here today with complaints of abdominal pain and nausea over the last 2 months. Says pain feels different than pancreatitis. Episodes of  pain occur several times a day, generally last about 30 minutes.  Symptoms are worse in the a.m., food really does not help or exacerbate the pain. The pan is worse when lying down.   She is drinking about 28 ounces of beer every day compared to 80 ounces daily last year. She took Advil for few days last month for back pain.   She is taking both Protonix 40 mg and famotidine 20 mg every morning.   She tries to stay busy to keep her mind off drinking.  Her weight is down from 112 pounds in August to 100 pounds today ? ?March 2022  ?CTAP w/ contrast showed subtle nodularity of liver border.  ? ?10/24/21 Seen in ED ?Bloody stool. Hemorrhoids on exam. Hgb normal.  ? ? ? ?INTERVAL HISTORY  ?She is trying to stop drinking. Drinking two 16 oz flavored beers a day. She has a history of much heavier drinking but not in the last years. A CT scan March 2022 year suggested cirrhosis. She has no [place] of liver disease. She has chronic non-radiating mid upper abdominal pain, worse with PO intake. She has chronic constipation. She has had rectal bleeding with BMs for several days. Went to ED, found to have hemorrhoids ? ?Laboratory data: ? ?  Latest Ref Rng & Units 11/06/2021  ? 10:46 AM 06/22/2021  ?  3:17 PM 01/30/2021  ?  9:28 AM  ?Hepatic Function  ?Total Protein 6.0 - 8.5 g/dL 7.2   6.5   6.4    ?Albumin 3.8 - 4.8 g/dL 4.7   3.7   3.3    ?AST 0 - 40 IU/L 81   27   126    ?ALT 0 - 32 IU/L 49   24   97    ?Alk Phosphatase 44 - 121 IU/L 161   107   139    ?Total Bilirubin 0.0 - 1.2 mg/dL 0.2   0.8   0.8    ? ? ? ?  Latest Ref Rng & Units 11/06/2021  ? 10:46 AM 06/22/2021  ?  3:17 PM 01/30/2021  ?  9:28 AM  ?CBC  ?WBC 3.4 - 10.8 x10E3/uL 7.8   7.0   9.2    ?Hemoglobin 11.1 - 15.9 g/dL 12.6   12.3   13.1    ?Hematocrit 34.0 - 46.6 % 38.7   35.5   38.8    ?Platelets 150 - 450 x10E3/uL 244   224   256    ? ? ?Previous GI Evaluations  ? ?Endoscopies:  ?March 2021 EGD  ?Normal ?Gastric biopsies obtained ?Surgical [P], gastric antrum and  gastric body ?- GASTRIC ANTRAL MUCOSA WITH MILD REACTIVE GASTROPATHY. ?- GASTRIC OXYNTIC MUCOSA WITH MILD CHRONIC GASTRITIS. ?- WARTHIN-STARRY STAIN IS NEGATIVE FOR HELICOBACTER PYLORI. ? ? ?Imaging:  ?10/24/20  CTAP ?IMPRESSION: ?Chronic pancreatitis with a small volume fluid about the head and ?neck of the pancreas and free pelvic fluid worrisome for ?superimposed acute pancreatitis. No evidence of pancreatic necrosis or pseudocyst formation. Subtle nodularity of the liver border consistent with cirrhosis. ? ?Past Medical History:  ?Diagnosis Date  ? Abdominal pain, epigastric 12/16/2014  ? Acute on  chronic pancreatitis ([place]) 03/22/2019  ? Acute pancreatitis   ? Alcohol abuse   ? Alcohol dependence ([place]) 12/16/2012  ? Medical detox successful   ? Alcohol induced acute pancreatitis 11/22/2017  ? Alcoholic cirrhosis of liver without ascites ([place]) 10/15/2016  ? Anxiety   ? Bipolar 1 disorder ([place])   ? Cancer [facility])   ? cervical cancer  ? Chest pain with moderate risk for cardiac etiology 12/15/2014  ? Cirrhosis ([place])   ? COPD (chronic obstructive pulmonary disease) ([place]) 09/04/2016  ? Coronary artery disease   ? Depression   ? Duodenitis 11/22/2017  ? Eczema 10/09/2018  ? Elevated liver enzymes 12/22/2014  ? Gastritis and gastroduodenitis 09/04/2016  ? GERD (gastroesophageal reflux disease)   ? Nondisplaced fracture of neck of fifth metacarpal bone, right hand, initial encounter for closed fracture 08/26/2017  ? Pancreatitis   ? 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 PTSD (post-traumatic stress disorder)   ? Tobacco abuse 09/04/2016  ? Tobacco abuse counseling   ? Vascular disease   ? ? ?Past Surgical History:  ?Procedure Laterality Date  ? ABDOMINAL HYSTERECTOMY  2004  ? BIOPSY  01/02/2018  ? Procedure: BIOPSY;  Surgeon: Milus Banister, MD;  Location: [facility];  Service: Endoscopy;;  ? ENDARTERECTOMY Right 01/26/2021  ? Procedure: RIGHT CAROTID ARTERY ENDARTERECTOMY;  Surgeon: Waynetta Sandy, MD;  Location: [place];  Service:  Vascular;  Laterality: Right;  ? ESOPHAGOGASTRODUODENOSCOPY (EGD) WITH PROPOFOL N/A 01/02/2018  ? Procedure: ESOPHAGOGASTRODUODENOSCOPY (EGD) WITH PROPOFOL;  Surgeon: Milus Banister, MD;  Location: [facility];

## 2021-12-01 NOTE — Telephone Encounter (Signed)
?  12/01/2021 ? ? ?RE: Felicia Perkins ?DOB: 09/07/1975 ?MRN: [id number] ? ? ?Dear Dr. Scot Dock,  ? ? ?We have scheduled the above patient for an endoscopic procedure. Our records show that she is on anticoagulation therapy.  ? ?Please advise as to how long the patient may come off her therapy of Plavix prior to the procedure, which is scheduled for 01/24/2022. ? ?Please fax back/ or route the completed form to [place] at [phone].  ? ?Sincerely, ? ? ? ?Phillis Haggis  ? ?

## 2021-12-04 ENCOUNTER — Telehealth: Payer: Self-pay

## 2021-12-04 ENCOUNTER — Other Ambulatory Visit: Payer: Self-pay

## 2021-12-04 DIAGNOSIS — K7030 Alcoholic cirrhosis of liver without ascites: Secondary | ICD-10-CM

## 2021-12-04 DIAGNOSIS — K860 Alcohol-induced chronic pancreatitis: Secondary | ICD-10-CM

## 2021-12-04 DIAGNOSIS — K5900 Constipation, unspecified: Secondary | ICD-10-CM

## 2021-12-04 NOTE — Addendum Note (Signed)
Addended by: Berniece Salines A on: 12/04/2021 02:12 PM ? ? Modules accepted: Orders ? ?

## 2021-12-04 NOTE — Progress Notes (Signed)
Addendum: ?Reviewed and agree with assessment and management plan. ?Agree with the importance of alcohol cessation particularly with concern for cirrhosis. ?I would add elastography to the scheduled liver ultrasound as another objective measure of fibrosis. ?I would add EGD to scheduled colonoscopy for variceal screening as her last upper endoscopy was over 2 years ago. ?Debarah Mccumbers, Lajuan Lines, MD ? ?

## 2021-12-04 NOTE — Telephone Encounter (Signed)
Added EGD to pt's scheduled colonoscopy. Called pt to let her know recommendations. Left voicemail for pt to call back.  ?

## 2021-12-05 NOTE — Telephone Encounter (Signed)
Spoke with pt and let her know that Felicia Perkins wanted to add an EGD to her colon on 6/14 due to her history of cirrhosis which can lead to dilated blood vessels in the esophagus. [place] abd with elastography added. Pt verbalized understanding and had no other concerns at end of call.  ?

## 2021-12-05 NOTE — Telephone Encounter (Signed)
Felicia Craze, NP  Felicia Potter, RN ?Felicia Salines, RN,  ?Just forwarding you this clinic note. As we just discussed please add elastrography portion to her scheduled ultrasound and also add an EGD onto her colonoscopy.  Please let her know about the upper endoscopy.  We are doing it because she has a history of cirrhosis and sometimes that can lead to dilated blood vessels in the esophagus which can bleed.  This is a screening for those dilated blood vessels and other possible consequences of liver disease.  Thank you  ?

## 2021-12-06 LAB — HEPATITIS B SURFACE ANTIBODY,QUALITATIVE: Hep B S Ab: NONREACTIVE

## 2021-12-06 LAB — HEPATITIS C ANTIBODY
Hepatitis C Ab: NONREACTIVE
SIGNAL TO CUT-OFF: 0.09 (ref <1.00)

## 2021-12-06 LAB — AFP TUMOR MARKER: AFP-Tumor Marker: 10.3 ng/mL — ABNORMAL HIGH

## 2021-12-06 LAB — ANTI-NUCLEAR AB-TITER (ANA TITER): ANA Titer 1: 1:320 {titer} — ABNORMAL HIGH

## 2021-12-06 LAB — HEPATITIS B CORE ANTIBODY, IGM: Hep B C IgM: NONREACTIVE

## 2021-12-06 LAB — MITOCHONDRIAL ANTIBODIES: Mitochondrial M2 Ab, IgG: <=20 U (ref <=20.0)

## 2021-12-06 LAB — CERULOPLASMIN: Ceruloplasmin: 25 mg/dL (ref 18–53)

## 2021-12-06 LAB — ANTI-SMOOTH MUSCLE ANTIBODY, IGG: Actin (Smooth Muscle) Antibody (IGG): <20 U (ref <20)

## 2021-12-06 LAB — ALPHA-1-ANTITRYPSIN: A-1 Antitrypsin, Ser: 133 mg/dL (ref 83–199)

## 2021-12-06 LAB — HEPATITIS B SURFACE ANTIGEN: Hepatitis B Surface Ag: NONREACTIVE

## 2021-12-06 LAB — IGA: Immunoglobulin A: 463 mg/dL — ABNORMAL HIGH (ref 47–310)

## 2021-12-06 LAB — TISSUE TRANSGLUTAMINASE, IGA: (tTG) Ab, IgA: <1 U/mL

## 2021-12-06 LAB — ANA: Anti Nuclear Antibody (ANA): POSITIVE — AB

## 2021-12-06 LAB — HEPATITIS A ANTIBODY, TOTAL: Hepatitis A AB,Total: REACTIVE — AB

## 2021-12-11 ENCOUNTER — Ambulatory Visit (HOSPITAL_COMMUNITY)
Admission: RE | Admit: 2021-12-11 | Discharge: 2021-12-11 | Disposition: A | Discharge status: Home / Self Care | Payer: Medicaid Other | Source: Ambulatory Visit | Attending: Nurse Practitioner | Admitting: Nurse Practitioner

## 2021-12-11 DIAGNOSIS — K860 Alcohol-induced chronic pancreatitis: Secondary | ICD-10-CM

## 2021-12-11 DIAGNOSIS — K7030 Alcoholic cirrhosis of liver without ascites: Secondary | ICD-10-CM

## 2021-12-14 NOTE — Telephone Encounter (Signed)
Spoke with patient and told her per Dr. Scot Dock she can hold Plavix for 5 days prior to her procedure.  Patient agreed.  ?

## 2021-12-20 ENCOUNTER — Ambulatory Visit (HOSPITAL_COMMUNITY)
Admission: RE | Admit: 2021-12-20 | Discharge: 2021-12-20 | Disposition: A | Discharge status: Home / Self Care | Payer: Medicaid Other | Source: Ambulatory Visit | Attending: Vascular Surgery | Admitting: Vascular Surgery

## 2021-12-20 ENCOUNTER — Ambulatory Visit (INDEPENDENT_AMBULATORY_CARE_PROVIDER_SITE_OTHER): Payer: Medicaid Other | Admitting: Vascular Surgery

## 2021-12-20 ENCOUNTER — Encounter: Payer: Self-pay | Admitting: Vascular Surgery

## 2021-12-20 VITALS — BP 125/84 | HR 71 | Temp 97.9°F | Resp 20 | Ht 64.5 in | Wt 102.0 lb

## 2021-12-20 DIAGNOSIS — Z9889 Other specified postprocedural states: Secondary | ICD-10-CM | POA: Diagnosis present

## 2021-12-20 NOTE — Progress Notes (Signed)
Carotid duplex has been completed.  ? ?Preliminary results in CV Proc.  ? ?[name] [name] ?12/20/2021 9:25 AM    ?
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
## 2021-12-29 ENCOUNTER — Telehealth: Payer: Self-pay

## 2021-12-29 ENCOUNTER — Other Ambulatory Visit: Payer: Self-pay

## 2021-12-29 ENCOUNTER — Other Ambulatory Visit: Payer: Self-pay | Admitting: Podiatry

## 2021-12-29 DIAGNOSIS — K7460 Unspecified cirrhosis of liver: Secondary | ICD-10-CM

## 2021-12-29 DIAGNOSIS — K7030 Alcoholic cirrhosis of liver without ascites: Secondary | ICD-10-CM

## 2021-12-29 DIAGNOSIS — M2041 Other hammer toe(s) (acquired), right foot: Secondary | ICD-10-CM | POA: Diagnosis not present

## 2021-12-29 MED ORDER — IBUPROFEN 600 MG PO TABS: 600.0000 mg | ORAL_TABLET | Freq: Four times a day (QID) | ORAL | 0 refills | Status: AC | PRN

## 2021-12-29 MED ORDER — ACETAMINOPHEN 500 MG PO TABS: 1000.0000 mg | ORAL_TABLET | Freq: Four times a day (QID) | ORAL | 0 refills | Status: AC | PRN

## 2021-12-29 MED ORDER — GABAPENTIN 300 MG PO CAPS: 300.0000 mg | ORAL_CAPSULE | Freq: Three times a day (TID) | ORAL | 0 refills | Status: DC

## 2021-12-29 MED ORDER — OXYCODONE HCL 5 MG PO TABS: 5.0000 mg | ORAL_TABLET | ORAL | 0 refills | Status: AC | PRN

## 2021-12-29 NOTE — Progress Notes (Signed)
5/19 hammertoe correction 2/3/4 right

## 2021-12-29 NOTE — Telephone Encounter (Signed)
Order placed for Fibrosure. Left message for pt to call back.

## 2021-12-29 NOTE — Telephone Encounter (Signed)
Spoke with pt and gave pt results and recommendations. Pt verbalized understanding and stated that she is in the hospital right now because she just had surgery on her foot but when she is able she will come in for lab.

## 2021-12-29 NOTE — Telephone Encounter (Signed)
-----   Message from Willia Craze, NP sent at 12/27/2021  5:15 PM EDT ----- Mickel Baas, please tell [name] that the elastography did not suggest advanced liver disease.  We are still trying to determine the extent of her liver disease.  Please schedule her a FibroSure if she is agreeable.. Thanks

## 2022-01-04 ENCOUNTER — Encounter: Payer: Medicaid Other | Admitting: Podiatry

## 2022-01-09 ENCOUNTER — Ambulatory Visit (INDEPENDENT_AMBULATORY_CARE_PROVIDER_SITE_OTHER): Payer: Medicaid Other

## 2022-01-09 ENCOUNTER — Ambulatory Visit (INDEPENDENT_AMBULATORY_CARE_PROVIDER_SITE_OTHER): Payer: Medicaid Other | Admitting: Podiatry

## 2022-01-09 DIAGNOSIS — M21621 Bunionette of right foot: Secondary | ICD-10-CM | POA: Diagnosis not present

## 2022-01-09 DIAGNOSIS — M2041 Other hammer toe(s) (acquired), right foot: Secondary | ICD-10-CM | POA: Diagnosis not present

## 2022-01-10 NOTE — Progress Notes (Signed)
  Subjective:  Patient ID: Felicia Perkins, female    DOB: [birth year],  MRN: [id number]  Chief Complaint  Patient presents with   Hammer Toe    POV #1 DOS 12/29/2021 HAMMERTOE CORRECTION 2-4 RT FOOT, INJECTION RT     46 y.o. female returns for post-op check.  She is doing well pain seems to be fairly controlled for her  Review of Systems: Negative except as noted in the HPI. Denies N/V/F/Ch.   Objective:  There were no vitals filed for this visit. There is no height or weight on file to calculate BMI. Constitutional Well developed. Well nourished.  Vascular Foot warm and well perfused. Capillary refill normal to all digits.  Calf is soft and supple, no posterior calf or knee pain, negative Homans' sign  Neurologic Normal speech. Oriented to person, place, and time. Epicritic sensation to light touch grossly present bilaterally.  Dermatologic Skin healing well without signs of infection. Skin edges well coapted without signs of infection.  Orthopedic: Tenderness to palpation noted about the surgical site.   Multiple view plain film radiographs: Good correction of hammertoe deformities with Kirschner wires intact, no change in position since immediate postop films Assessment:   1. Acquired hammertoe of right foot   2. Tailor's bunion of right foot    Plan:  Patient was evaluated and treated and all questions answered.  S/p foot surgery right -Progressing as expected post-operatively. -XR: Noted above -WB Status: WBAT in surgical shoe -Sutures: Plan to remove next week. -Medications: No refills required -Foot redressed.  Return in about 9 days (around 01/18/2022) for suture removal.

## 2022-01-18 ENCOUNTER — Ambulatory Visit (INDEPENDENT_AMBULATORY_CARE_PROVIDER_SITE_OTHER): Payer: Medicaid Other | Admitting: Podiatry

## 2022-01-18 ENCOUNTER — Other Ambulatory Visit: Payer: Self-pay | Admitting: Podiatry

## 2022-01-18 DIAGNOSIS — M2041 Other hammer toe(s) (acquired), right foot: Secondary | ICD-10-CM

## 2022-01-18 MED ORDER — OXYCODONE HCL 5 MG PO TABS: 5.0000 mg | ORAL_TABLET | ORAL | 0 refills | Status: DC | PRN

## 2022-01-18 MED ORDER — ACETAMINOPHEN 500 MG PO TABS: 1000.0000 mg | ORAL_TABLET | Freq: Four times a day (QID) | ORAL | 0 refills | Status: AC | PRN

## 2022-01-19 ENCOUNTER — Other Ambulatory Visit (HOSPITAL_COMMUNITY): Payer: Self-pay

## 2022-01-19 ENCOUNTER — Telehealth: Payer: Self-pay | Admitting: Podiatry

## 2022-01-19 MED ORDER — OXYCODONE HCL 5 MG PO TABS
5.0000 mg | ORAL_TABLET | ORAL | 0 refills | Status: AC | PRN
  Filled 2022-01-19: qty 28, 5d supply, fill #0

## 2022-01-19 MED ORDER — OXYCODONE HCL 5 MG PO TABS: 5.0000 mg | ORAL_TABLET | ORAL | 0 refills | Status: DC | PRN

## 2022-01-19 NOTE — Telephone Encounter (Addendum)
1149am spoke with patient again she is in a lot of pain and would like medication transferred asap.    Patient called her pharmacy could not fill her medication , she needs it resent to the [facility] outpatient pharmacy  - please resend the acetaminophen (TYLENOL) 500 MG tablet [[id number]] oxyCODONE (OXY IR/ROXICODONE) 5 MG immediate release tablet [[id number]]

## 2022-01-19 NOTE — Telephone Encounter (Signed)
1149am spoke with patient again she is in a lot of pain and would like medication transferred asap.      Patient called her pharmacy could not fill her medication , she needs it resent to the [facility] outpatient pharmacy  - please resend the acetaminophen (TYLENOL) 500 MG tablet [[id number]] oxyCODONE (OXY IR/ROXICODONE) 5 MG immediate release tablet [[id number]

## 2022-01-19 NOTE — Telephone Encounter (Signed)
Please advise 

## 2022-01-19 NOTE — Telephone Encounter (Signed)
Patient called again she needs her pain medication called into  [facility] outpatient pharmacy  - [facility] is currently out of it and wont get it until next week     oxyCODONE (OXY IR/ROXICODONE) 5 MG immediate release tablet [[id number]]   acetaminophen (TYLENOL) 500 MG tablet [[id number]]

## 2022-01-19 NOTE — Telephone Encounter (Signed)
Patient is scheduled to rtw 01/29/2022, pt still has pins in and next  appt is 6/29. How long should I extend leave?

## 2022-01-22 NOTE — Telephone Encounter (Signed)
Please verify with the patient, but as long as she remains able to bear weight enough to transfer and she feels she can complete the bowel prep she can proceed with scheduled EGD/colon Thanks JMP

## 2022-01-22 NOTE — Progress Notes (Signed)
  Subjective:  Patient ID: Felicia Perkins, female    DOB: [birth year],  MRN: [id number]  Chief Complaint  Patient presents with   POV #2     POV #2      46 y.o. female returns for post-op check.  Doing okay.  She does request refill of her pain medication today  Review of Systems: Negative except as noted in the HPI. Denies N/V/F/Ch.   Objective:  There were no vitals filed for this visit. There is no height or weight on file to calculate BMI. Constitutional Well developed. Well nourished.  Vascular Foot warm and well perfused. Capillary refill normal to all digits.  Calf is soft and supple, no posterior calf or knee pain, negative Homans' sign  Neurologic Normal speech. Oriented to person, place, and time. Epicritic sensation to light touch grossly present bilaterally.  Dermatologic Skin healing well without signs of infection. Skin edges well coapted without signs of infection.  Orthopedic: Tenderness to palpation noted about the surgical site.   Multiple view plain film radiographs: Good correction of hammertoe deformities with Kirschner wires intact, no change in position since immediate postop films Assessment:   1. Acquired hammertoe of right foot    Plan:  Patient was evaluated and treated and all questions answered.  S/p foot surgery right -Sutures removed uneventfully.  Continue surgical shoe.  Return in 3 weeks for new x-rays and pin removal.  I did refill her pain medication. Return in about 3 weeks (around 02/08/2022) for post op (new x-rays), pin .

## 2022-01-24 ENCOUNTER — Encounter: Payer: Self-pay | Admitting: Internal Medicine

## 2022-01-24 ENCOUNTER — Ambulatory Visit (AMBULATORY_SURGERY_CENTER): Payer: Medicaid Other | Admitting: Internal Medicine

## 2022-01-24 ENCOUNTER — Other Ambulatory Visit: Payer: Medicaid Other

## 2022-01-24 VITALS — BP 130/83 | HR 63 | Temp 96.6°F | Resp 15 | Ht 64.0 in | Wt 101.0 lb

## 2022-01-24 DIAGNOSIS — D122 Benign neoplasm of ascending colon: Secondary | ICD-10-CM | POA: Diagnosis not present

## 2022-01-24 DIAGNOSIS — K319 Disease of stomach and duodenum, unspecified: Secondary | ICD-10-CM

## 2022-01-24 DIAGNOSIS — Z1211 Encounter for screening for malignant neoplasm of colon: Secondary | ICD-10-CM

## 2022-01-24 DIAGNOSIS — R933 Abnormal findings on diagnostic imaging of other parts of digestive tract: Secondary | ICD-10-CM | POA: Diagnosis not present

## 2022-01-24 DIAGNOSIS — K7460 Unspecified cirrhosis of liver: Secondary | ICD-10-CM

## 2022-01-24 DIAGNOSIS — K766 Portal hypertension: Secondary | ICD-10-CM

## 2022-01-24 DIAGNOSIS — K7030 Alcoholic cirrhosis of liver without ascites: Secondary | ICD-10-CM

## 2022-01-24 MED ORDER — SODIUM CHLORIDE 0.9 % IV SOLN: 500.0000 mL | INTRAVENOUS | Status: DC

## 2022-01-24 NOTE — Progress Notes (Signed)
To pacu, VSS. Report to [email or web address] 

## 2022-01-24 NOTE — Op Note (Signed)
[facility] Patient Name: Felicia Perkins Procedure Date: 01/24/2022 9:39 AM MRN: [id number] Endoscopist: Jerene Bears , MD Age: 46 Referring MD:  Date of Birth: 06/05/1976 Gender: Female Account #: [id number] Procedure:                Colonoscopy Indications:              Screening for colorectal malignant neoplasm, This                            is the patient's first colonoscopy Medicines:                Monitored Anesthesia Care Procedure:                Pre-Anesthesia Assessment:                           - Prior to the procedure, a History and Physical                            was performed, and patient medications and                            allergies were reviewed. The patient's tolerance of                            previous anesthesia was also reviewed. The risks                            and benefits of the procedure and the sedation                            options and risks were discussed with the patient.                            All questions were answered, and informed consent                            was obtained. Prior Anticoagulants: The patient has                            taken Plavix (clopidogrel), last dose was 5 days                            prior to procedure. ASA Grade Assessment: III - A                            patient with severe systemic disease. After                            reviewing the risks and benefits, the patient was                            deemed in satisfactory condition to undergo the  procedure.                           After obtaining informed consent, the colonoscope                            was passed under direct vision. Throughout the                            procedure, the patient's blood pressure, pulse, and                            oxygen saturations were monitored continuously. The                            PCF-HQ190L Colonoscope was introduced through the                             anus and advanced to the cecum, identified by                            appendiceal orifice and ileocecal valve. The                            colonoscopy was performed without difficulty. The                            patient tolerated the procedure well. The quality                            of the bowel preparation was good. The ileocecal                            valve, appendiceal orifice, and rectum were                            photographed. Scope In: 10:01:26 AM Scope Out: 10:16:05 AM Scope Withdrawal Time: 0 hours 10 minutes 53 seconds  Total Procedure Duration: 0 hours 14 minutes 39 seconds  Findings:                 The digital rectal exam was normal.                           A 3 mm polyp was found in the ascending colon. The                            polyp was sessile. The polyp was removed with a                            cold snare. Resection and retrieval were complete.                           Scattered small-mouthed diverticula were found in  the descending colon, hepatic flexure and ascending                            colon.                           Internal hemorrhoids were found during                            retroflexion. The hemorrhoids were small. Complications:            No immediate complications. Estimated Blood Loss:     Estimated blood loss was minimal. Impression:               - One 3 mm polyp in the ascending colon, removed                            with a cold snare. Resected and retrieved.                           - Diverticulosis in the descending colon, at the                            hepatic flexure and in the ascending colon.                           - Internal hemorrhoids. Recommendation:           - Patient has a contact number available for                            emergencies. The signs and symptoms of potential                            delayed complications were discussed with  the                            patient. Return to normal activities tomorrow.                            Written discharge instructions were provided to the                            patient.                           - Resume previous diet.                           - Continue present medications.                           - Resume Plavix (clopidogrel) at prior dose today.                            Refer to managing physician for further adjustment  of therapy.                           - Await pathology results.                           - Repeat colonoscopy is recommended. The                            colonoscopy date will be determined after pathology                            results from today's exam become available for                            review. Jerene Bears, MD 01/24/2022 10:22:46 AM This report has been signed electronically.

## 2022-01-24 NOTE — Op Note (Signed)
[facility] Patient Name: Felicia Perkins Procedure Date: 01/24/2022 9:40 AM MRN: [id number] Endoscopist: Jerene Bears , MD Age: 46 Referring MD:  Date of Birth: 1976/06/17 Gender: Female Account #: [id number] Procedure:                Upper GI endoscopy Indications:              Portal hypertension rule out esophageal varices                            (portal HTN suggested by CT scan; elastography                            discordant suggesting against advanced fibrosis) Medicines:                Monitored Anesthesia Care Procedure:                Pre-Anesthesia Assessment:                           - Prior to the procedure, a History and Physical                            was performed, and patient medications and                            allergies were reviewed. The patient's tolerance of                            previous anesthesia was also reviewed. The risks                            and benefits of the procedure and the sedation                            options and risks were discussed with the patient.                            All questions were answered, and informed consent                            was obtained. Prior Anticoagulants: The patient has                            taken Plavix (clopidogrel), last dose was 5 days                            prior to procedure. ASA Grade Assessment: III - A                            patient with severe systemic disease. After                            reviewing the risks and benefits, the patient was  deemed in satisfactory condition to undergo the                            procedure.                           After obtaining informed consent, the endoscope was                            passed under direct vision. Throughout the                            procedure, the patient's blood pressure, pulse, and                            oxygen saturations were monitored continuously.  The                            Endoscope was introduced through the mouth, and                            advanced to the second part of duodenum. The upper                            GI endoscopy was accomplished without difficulty.                            The patient tolerated the procedure well. Scope In: Scope Out: Findings:                 The examined esophagus was normal. No varices.                           Diffuse mildly erythematous mucosa without bleeding                            was found in the gastric antrum. Biopsies were                            taken with a cold forceps for histology and                            Helicobacter pylori testing. No portal gastropathy.                           The examined duodenum was normal. Complications:            No immediate complications. Estimated Blood Loss:     Estimated blood loss was minimal. Impression:               - Normal esophagus.                           - No esophageal or gastric varices.                           -  Erythematous mucosa in the antrum. Biopsied.                           - Normal examined duodenum. Recommendation:           - Patient has a contact number available for                            emergencies. The signs and symptoms of potential                            delayed complications were discussed with the                            patient. Return to normal activities tomorrow.                            Written discharge instructions were provided to the                            patient.                           - Resume previous diet.                           - Continue present medications.                           - Await pathology results.                           - Go to lab to collect Fibrosure test.                           - Strict alcohol avoidance is strongly recommended.                           - Can resume Plavix today. Jerene Bears, MD 01/24/2022 10:21:04  AM This report has been signed electronically.

## 2022-01-24 NOTE — Patient Instructions (Signed)
Resume previous medications.  Biopsies taken and 1 polyp removed and sent to pathology.  Await results for final recommendations.    Handouts on findings given to patient.   (Polyps, diverticulosis, hemorrhoids)  Go to the lab today.  Resume Plavix today.   Recommend avoiding all alcohol.   YOU HAD AN ENDOSCOPIC PROCEDURE TODAY AT [facility]:   Refer to the procedure report that was given to you for any specific questions about what was found during the examination.  If the procedure report does not answer your questions, please call your gastroenterologist to clarify.  If you requested that your care partner not be given the details of your procedure findings, then the procedure report has been included in a sealed envelope for you to review at your convenience later.  YOU SHOULD EXPECT: Some feelings of bloating in the abdomen. Passage of more gas than usual.  Walking can help get rid of the air that was put into your GI tract during the procedure and reduce the bloating. If you had a lower endoscopy (such as a colonoscopy or flexible sigmoidoscopy) you may notice spotting of blood in your stool or on the toilet paper. If you underwent a bowel prep for your procedure, you may not have a normal bowel movement for a few days.  Please Note:  You might notice some irritation and congestion in your nose or some drainage.  This is from the oxygen used during your procedure.  There is no need for concern and it should clear up in a day or so.  SYMPTOMS TO REPORT IMMEDIATELY:  Following lower endoscopy (colonoscopy or flexible sigmoidoscopy):  Excessive amounts of blood in the stool  Significant tenderness or worsening of abdominal pains  Swelling of the abdomen that is new, acute  Fever of 100F or higher  Following upper endoscopy (EGD)  Vomiting of blood or coffee ground material  New chest pain or pain under the shoulder blades  Painful or persistently difficult  swallowing  New shortness of breath  Fever of 100F or higher  Black, tarry-looking stools  For urgent or emergent issues, a gastroenterologist can be reached at any hour by calling [phone]. Do not use MyChart messaging for urgent concerns.    DIET:  We do recommend a small meal at first, but then you may proceed to your regular diet.  Drink plenty of fluids but you should avoid alcoholic beverages for 24 hours.  ACTIVITY:  You should plan to take it easy for the rest of today and you should NOT DRIVE or use heavy machinery until tomorrow (because of the sedation medicines used during the test).    FOLLOW UP: Our staff will call the number listed on your records 24-72 hours following your procedure to check on you and address any questions or concerns that you may have regarding the information given to you following your procedure. If we do not reach you, we will leave a message.  We will attempt to reach you two times.  During this call, we will ask if you have developed any symptoms of COVID 19. If you develop any symptoms (ie: fever, flu-like symptoms, shortness of breath, cough etc.) before then, please call [phone].  If you test positive for Covid 19 in the 2 weeks post procedure, please call and report this information to [place].    If any biopsies were taken you will be contacted by phone or by letter within the next 1-3 weeks.  Please  call us at [phone] if you have not heard about the biopsies in 3 weeks.    SIGNATURES/CONFIDENTIALITY: You and/or your care partner have signed paperwork which will be entered into your electronic medical record.  These signatures attest to the fact that that the information above on your After Visit Summary has been reviewed and is understood.  Full responsibility of the confidentiality of this discharge information lies with you and/or your care-partner.

## 2022-01-24 NOTE — Progress Notes (Signed)
GASTROENTEROLOGY PROCEDURE H&P NOTE   Primary Care Physician: Charlott Rakes, MD    Reason for Procedure:  Question of cirrhosis, colon cancer screening  Plan:    Upper endoscopy and colonoscopy  Patient is appropriate for endoscopic procedure(s) in the ambulatory ([facility]) setting.  The nature of the procedure, as well as the risks, benefits, and alternatives were carefully and thoroughly reviewed with the patient. Ample time for discussion and questions allowed. The patient understood, was satisfied, and agreed to proceed.     HPI: Felicia Perkins is a 46 y.o. female who presents for EGD and colonoscopy.  Medical history as below.  Tolerated the prep.  No recent chest pain or shortness of breath.  No abdominal pain today.  Plavix on hold x5 days  Past Medical History:  Diagnosis Date   Abdominal pain, epigastric 12/16/2014   Acute on chronic pancreatitis ([facility]) 03/22/2019   Acute pancreatitis    Alcohol abuse    Alcohol dependence ([facility]) 12/16/2012   Medical detox successful    Alcohol induced acute pancreatitis 0/24/0973   Alcoholic cirrhosis of liver without ascites ([facility]) 10/15/2016   Anxiety    Bipolar 1 disorder (HCC)    Cancer ([facility])    cervical cancer   Chest pain with moderate risk for cardiac etiology 12/15/2014   Cirrhosis ([facility])    COPD (chronic obstructive pulmonary disease) ([facility]) 09/04/2016   Coronary artery disease    Depression    Duodenitis 11/22/2017   Eczema 10/09/2018   Elevated liver enzymes 12/22/2014   Gastritis and gastroduodenitis 09/04/2016   GERD (gastroesophageal reflux disease)    Nondisplaced fracture of neck of fifth metacarpal bone, right hand, initial encounter for closed fracture 08/26/2017   Pancreatitis    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   PTSD (post-traumatic stress disorder)    Tobacco abuse 09/04/2016   Tobacco abuse counseling    Vascular disease     Past Surgical History:  Procedure Laterality Date   ABDOMINAL HYSTERECTOMY  2004    BIOPSY  01/02/2018   Procedure: BIOPSY;  Surgeon: Milus Banister, MD;  Location: [facility];  Service: Endoscopy;;   COLONOSCOPY     Done in [place] she thinks. Early 20's. Normal   ENDARTERECTOMY Right 01/26/2021   Procedure: RIGHT CAROTID ARTERY ENDARTERECTOMY;  Surgeon: Waynetta Sandy, MD;  Location: [facility];  Service: Vascular;  Laterality: Right;   ESOPHAGOGASTRODUODENOSCOPY (EGD) WITH PROPOFOL N/A 01/02/2018   Procedure: ESOPHAGOGASTRODUODENOSCOPY (EGD) WITH PROPOFOL;  Surgeon: Milus Banister, MD;  Location: [facility];  Service: Endoscopy;  Laterality: N/A;   EUS N/A 01/02/2018   Procedure: UPPER ENDOSCOPIC ULTRASOUND (EUS) RADIAL;  Surgeon: Milus Banister, MD;  Location: [facility];  Service: Endoscopy;  Laterality: N/A;   FOOT SURGERY     OTHER SURGICAL HISTORY     ovarian surgery   TUBAL LIGATION      Prior to Admission medications   Medication Sig Start Date End Date Taking? Authorizing Provider  amLODipine (NORVASC) 2.5 MG tablet Take 1 tablet (2.5 mg total) by mouth daily. 03/15/21 01/24/22 Yes Tobb, Kardie, DO  oxyCODONE (OXY IR/ROXICODONE) 5 MG immediate release tablet Take 1 tablet (5 mg total) by mouth every 4 (four) hours as needed for up to 7 days for severe pain. 01/19/22 01/26/22 Yes McDonald, Stephan Minister, DPM  acetaminophen (TYLENOL) 500 MG tablet Take 2 tablets (1,000 mg total) by mouth every 6 (six) hours as needed for up to 14 days (pain). 01/18/22 02/01/22  Criselda Peaches, DPM  albuterol (VENTOLIN HFA) 108 (90 Base) MCG/ACT inhaler Inhale 1-2 puffs into the lungs every 6 (six) hours as needed for wheezing or shortness of breath. 11/01/21   Charlott Rakes, MD  cetirizine (ZYRTEC) 10 MG tablet Take 1 tablet (10 mg total) by mouth daily. 11/01/21   Charlott Rakes, MD  clopidogrel (PLAVIX) 75 MG tablet TAKE 1 TABLET BY MOUTH EVERY DAY 11/23/21   Angelia Mould, MD  famotidine (PEPCID) 20 MG tablet Take 1 tablet (20 mg total) by mouth 2 (two) times  daily. Patient taking differently: Take 20 mg by mouth daily. 08/24/20   Argentina Donovan, PA-C  fluticasone (FLONASE) 50 MCG/ACT nasal spray Place 2 sprays into both nostrils daily. 11/01/21   Charlott Rakes, MD  gabapentin (NEURONTIN) 300 MG capsule Take 1 capsule (300 mg total) by mouth 3 (three) times daily for 7 days. 12/29/21 01/05/22  McDonald, Stephan Minister, DPM  hydrOXYzine (ATARAX/VISTARIL) 25 MG tablet Take 25 mg by mouth daily as needed for itching. 12/09/20   [provider]  mometasone-formoterol (DULERA) 200-5 MCG/ACT AERO Inhale 2 puffs into the lungs daily as needed. 11/01/21   Charlott Rakes, MD  olopatadine (PATANOL) 0.1 % ophthalmic solution Place 1 drop into both eyes 2 (two) times daily. 11/01/21   Charlott Rakes, MD  ondansetron (ZOFRAN) 4 MG tablet Take 1 tablet (4 mg total) by mouth every 6 (six) hours. 10/24/21   Piontek, Junie Panning, MD  pantoprazole (PROTONIX) 40 MG tablet TAKE 1 TABLET BY MOUTH TWICE A DAY 09/27/21   Charlott Rakes, MD  rosuvastatin (CRESTOR) 10 MG tablet TAKE 1 TABLET BY MOUTH EVERY DAY 11/21/21   Angelia Mould, MD  cloNIDine (CATAPRES) 0.1 MG tablet Take 1 tablet (0.1 mg total) by mouth at bedtime. For hot flashes Patient not taking: Reported on 10/17/2019 01/13/19 08/15/20  Charlott Rakes, MD    Current Outpatient Medications  Medication Sig Dispense Refill   amLODipine (NORVASC) 2.5 MG tablet Take 1 tablet (2.5 mg total) by mouth daily. 90 tablet 3   oxyCODONE (OXY IR/ROXICODONE) 5 MG immediate release tablet Take 1 tablet (5 mg total) by mouth every 4 (four) hours as needed for up to 7 days for severe pain. 28 tablet 0   acetaminophen (TYLENOL) 500 MG tablet Take 2 tablets (1,000 mg total) by mouth every 6 (six) hours as needed for up to 14 days (pain). 112 tablet 0   albuterol (VENTOLIN HFA) 108 (90 Base) MCG/ACT inhaler Inhale 1-2 puffs into the lungs every 6 (six) hours as needed for wheezing or shortness of breath. 8.5 g 2   cetirizine (ZYRTEC) 10  MG tablet Take 1 tablet (10 mg total) by mouth daily. 30 tablet 1   clopidogrel (PLAVIX) 75 MG tablet TAKE 1 TABLET BY MOUTH EVERY DAY 30 tablet 11   famotidine (PEPCID) 20 MG tablet Take 1 tablet (20 mg total) by mouth 2 (two) times daily. (Patient taking differently: Take 20 mg by mouth daily.) 60 tablet 2   fluticasone (FLONASE) 50 MCG/ACT nasal spray Place 2 sprays into both nostrils daily. 16 g 6   gabapentin (NEURONTIN) 300 MG capsule Take 1 capsule (300 mg total) by mouth 3 (three) times daily for 7 days. 21 capsule 0   hydrOXYzine (ATARAX/VISTARIL) 25 MG tablet Take 25 mg by mouth daily as needed for itching.     mometasone-formoterol (DULERA) 200-5 MCG/ACT AERO Inhale 2 puffs into the lungs daily as needed. 13 g 3   olopatadine (PATANOL) 0.1 % ophthalmic solution  Place 1 drop into both eyes 2 (two) times daily. 5 mL 6   ondansetron (ZOFRAN) 4 MG tablet Take 1 tablet (4 mg total) by mouth every 6 (six) hours. 12 tablet 0   pantoprazole (PROTONIX) 40 MG tablet TAKE 1 TABLET BY MOUTH TWICE A DAY 20 tablet 0   rosuvastatin (CRESTOR) 10 MG tablet TAKE 1 TABLET BY MOUTH EVERY DAY 30 tablet 11   Current Facility-Administered Medications  Medication Dose Route Frequency Provider Last Rate Last Admin   0.9 %  sodium chloride infusion  500 mL Intravenous Continuous Dwayna Kentner, Lajuan Lines, MD        Allergies as of 01/24/2022   (No Known Allergies)    Family History  Problem Relation Age of Onset   Hypertension Mother    Breast cancer Mother 59   Pancreatitis Mother    Hypertension Father    Heart attack Cousin 38       died in 12s of MI   Breast cancer Maternal Aunt    Breast cancer Maternal Aunt    Colon cancer Neg Hx    Esophageal cancer Neg Hx    Rectal cancer Neg Hx    Stomach cancer Neg Hx     Social History   Socioeconomic History   Marital status: Single    Spouse name: Not on file   Number of children: 3   Years of education: Not on file   Highest education level: Not on  file  Occupational History   Occupation: homemaker  Tobacco Use   Smoking status: Every Day    Packs/day: 0.50    Types: Cigarettes   Smokeless tobacco: Never   Tobacco comments:    2-3 cigarettes daily  Vaping Use   Vaping Use: Never used  Substance and Sexual Activity   Alcohol use: Not Currently    Comment: clean for 27 days   Drug use: No   Sexual activity: Yes    Birth control/protection: Condom  Other Topics Concern   Not on file  Social History Narrative   Not on file   Social Determinants of Health   Financial Resource Strain: Not on file  Food Insecurity: Not on file  Transportation Needs: Not on file  Physical Activity: Not on file  Stress: Not on file  Social Connections: Not on file  Intimate Partner Violence: Not on file    Physical Exam: Vital signs in last 24 hours: '@BP'$  137/89   Pulse (!) 107   Temp (!) 96.6 F (35.9 C)   Resp (!) 9   Ht '5\' 4"'$  (1.626 m)   Wt 101 lb (45.8 kg)   SpO2 100%   BMI 17.34 kg/m  GEN: NAD EYE: Sclerae anicteric ENT: MMM CV: Non-tachycardic Pulm: CTA b/l GI: Soft, NT/ND NEURO:  Alert & Oriented x 3   Zenovia Jarred, MD [facility]  01/24/2022 9:47 AM

## 2022-01-24 NOTE — Progress Notes (Signed)
Called to room to assist during endoscopic procedure.  Patient ID and intended procedure confirmed with present staff. Received instructions for my participation in the procedure from the performing physician.  

## 2022-01-25 ENCOUNTER — Telehealth: Payer: Self-pay | Admitting: *Deleted

## 2022-01-25 NOTE — Telephone Encounter (Signed)
  Follow up Call-     01/24/2022    9:08 AM 10/20/2019    9:33 AM  Call back number  Post procedure Call Back phone  # [phone] ok to speak to Kerry Dory [phone]  Permission to leave phone message Yes Yes     Patient questions:  Do you have a fever, pain , or abdominal swelling? No. Pain Score  0 *  Have you tolerated food without any problems? Yes.    Have you been able to return to your normal activities? Yes.    Do you have any questions about your discharge instructions: Diet   No. Medications  No. Follow up visit  No.  Do you have questions or concerns about your Care? No.  Actions: * If pain score is 4 or above: No action needed, pain <4.

## 2022-02-02 ENCOUNTER — Encounter: Payer: Self-pay | Admitting: Internal Medicine

## 2022-02-05 LAB — NASH FIBROSURE
ALPHA 2-MACROGLOBULINS, QN: 222 mg/dL (ref 110–276)
ALT (SGPT) P5P: 80 IU/L — ABNORMAL HIGH (ref 0–40)
AST (SGOT) P5P: 113 IU/L — ABNORMAL HIGH (ref 0–40)
Apolipoprotein A-1: 205 mg/dL (ref 116–209)
Bilirubin, Total: 0.4 mg/dL (ref 0.0–1.2)
Cholesterol, Total: 215 mg/dL — ABNORMAL HIGH (ref 100–199)
Fibrosis Score: 0.25 — ABNORMAL HIGH (ref 0.00–0.21)
GGT: 612 IU/L — ABNORMAL HIGH (ref 0–60)
Glucose: 93 mg/dL (ref 70–99)
Haptoglobin: 160 mg/dL (ref 42–296)
Height: 64 in
NASH Score: 0.25
Steatosis Score: 0.57 — ABNORMAL HIGH (ref 0.00–0.30)
Triglycerides: 232 mg/dL — ABNORMAL HIGH (ref 0–149)
Weight: 102 [lb_av]

## 2022-02-05 LAB — NASH FIBROSURE(R) PLUS
Fibrosis Score: 0.25 — ABNORMAL HIGH (ref 0.00–0.21)
NASH Score: 0.9 — ABNORMAL HIGH (ref 0.00–0.25)
Steatosis Score: 0.77 — ABNORMAL HIGH (ref 0.00–0.40)

## 2022-02-08 ENCOUNTER — Ambulatory Visit (INDEPENDENT_AMBULATORY_CARE_PROVIDER_SITE_OTHER): Payer: Medicaid Other

## 2022-02-08 ENCOUNTER — Ambulatory Visit (INDEPENDENT_AMBULATORY_CARE_PROVIDER_SITE_OTHER): Payer: Medicaid Other | Admitting: Podiatry

## 2022-02-08 DIAGNOSIS — M21621 Bunionette of right foot: Secondary | ICD-10-CM

## 2022-02-08 DIAGNOSIS — M2041 Other hammer toe(s) (acquired), right foot: Secondary | ICD-10-CM

## 2022-02-11 ENCOUNTER — Encounter: Payer: Self-pay | Admitting: Podiatry

## 2022-02-11 NOTE — Progress Notes (Signed)
  Subjective:  Patient ID: Felicia Perkins, female    DOB: 06/02/1976,  MRN: [id number]  Chief Complaint  Patient presents with   Routine Post Op      POV #3 DOS 12/29/2021 HAMMERTOE CORRECTION 2-4 RT FOOT, INJECTION RT     46 y.o. female returns for post-op check.  Doing okay.  Still having some intermittent swelling and pain especially in the fourth toe  Review of Systems: Negative except as noted in the HPI. Denies N/V/F/Ch.   Objective:  There were no vitals filed for this visit. There is no height or weight on file to calculate BMI. Constitutional Well developed. Well nourished.  Vascular Foot warm and well perfused. Capillary refill normal to all digits.  Calf is soft and supple, no posterior calf or knee pain, negative Homans' sign  Neurologic Normal speech. Oriented to person, place, and time. Epicritic sensation to light touch grossly present bilaterally.  Dermatologic Incisions are well-healed nonhypertrophic  Orthopedic: Tenderness to palpation noted about the surgical site.   Multiple view plain film radiographs: Near complete consolidation across the arthrodesis sites Assessment:   1. Acquired hammertoe of right foot   2. Tailor's bunion of right foot    Plan:  Patient was evaluated and treated and all questions answered.  S/p foot surgery right -Continues to improve.  Kirschner wires removed uneventfully.  She may return to work in 2 weeks.  I advised her this may continue to have intermittent swelling and pain but expected to resolve uneventfully she can resume regular shoe gear.  Return to see me as needed. Return if symptoms worsen or fail to improve.

## 2022-03-09 ENCOUNTER — Telehealth: Payer: Self-pay | Admitting: *Deleted

## 2022-03-09 NOTE — Telephone Encounter (Signed)
Patient is calling because she is starting to have ankle swelling , please advise/schedule for  f/u appointment.

## 2022-03-13 ENCOUNTER — Ambulatory Visit (HOSPITAL_COMMUNITY)
Admission: EM | Admit: 2022-03-13 | Discharge: 2022-03-13 | Disposition: A | Discharge status: Home / Self Care | Payer: Medicaid Other | Attending: Emergency Medicine | Admitting: Emergency Medicine

## 2022-03-13 ENCOUNTER — Encounter (HOSPITAL_COMMUNITY): Payer: Self-pay

## 2022-03-13 DIAGNOSIS — R112 Nausea with vomiting, unspecified: Secondary | ICD-10-CM

## 2022-03-13 DIAGNOSIS — K5900 Constipation, unspecified: Secondary | ICD-10-CM

## 2022-03-13 DIAGNOSIS — R1013 Epigastric pain: Secondary | ICD-10-CM | POA: Diagnosis not present

## 2022-03-13 MED ORDER — PANTOPRAZOLE SODIUM 40 MG PO TBEC: 40.0000 mg | DELAYED_RELEASE_TABLET | Freq: Every day | ORAL | 0 refills | Status: DC

## 2022-03-13 MED ORDER — ONDANSETRON HCL 4 MG PO TABS: 4.0000 mg | ORAL_TABLET | Freq: Three times a day (TID) | ORAL | 0 refills | Status: DC | PRN

## 2022-03-13 MED ORDER — LIDOCAINE VISCOUS HCL 2 % MT SOLN: 15.0000 mL | Freq: Four times a day (QID) | OROMUCOSAL | 0 refills | Status: DC | PRN

## 2022-03-13 NOTE — Discharge Instructions (Signed)
Use Mylanta per package instructions.  You can use the lidocaine to help with the pain for a day or so.  Use the nausea medicine only if needed.  Take the pantoprazole every day.  Follow-up with your GI as scheduled.  Try prune juice or prunes and high-fiber foods for your constipation.

## 2022-03-13 NOTE — ED Triage Notes (Signed)
Patient has history of gastritis. States onset Saturday with constipation and then a burning pain in the abdomen. Emesis onset yesterday, a thick yellow liquid substance comes up.   No dietary or medication changes. No one around the Patient with the same symptoms.

## 2022-03-13 NOTE — ED Provider Notes (Signed)
[facility]    CSN: [id number] Arrival date & time: 03/13/22  1424      History   Chief Complaint Chief Complaint  Patient presents with   Emesis   Constipation   Abdominal Pain    HPI Felicia Perkins is a 46 y.o. female. Pt has a history of gastritis. Has been seeing GI. Since EGD and colonoscopy 6/14 she has had mild epigastric pain. She has also been out of her pantoprazole and has not been taking famotidine. She reports she still has epigastric pain and also has LUQ pain since 03/10/22. Wasn't intolerable until yesterday when she began vomiting. She have vomited 3 times since yesterday; emesis is thick yellow. Denies blood in emesis. Reports she is supposed to eat a bland diet but doesn't like it and still eats things she shouldn't yesterday had fried food. She believes this triggered her emesis and worsening stomach pain. Has been taking mylanta for symptoms for some relief. Reports does not have regular bowel movements and thinks she is constipated. She reports she sometimes passes stool after taking mylanta. Denies fever or chills. Denies having any alcohol since GI procedures in June. Requests nausea medicine, refill of pantoprazole, and a prescription for viscous lidocaine. She reports when she's had similar sx in the past that have taken her to the ED, the ED has given her mylanta mixed with lidocaine that has helped her pain.    Emesis Associated symptoms: abdominal pain   Constipation Associated symptoms: abdominal pain and vomiting   Abdominal Pain Associated symptoms: constipation and vomiting     Past Medical History:  Diagnosis Date   Abdominal pain, epigastric 12/16/2014   Acute on chronic pancreatitis ([place]) 03/22/2019   Acute pancreatitis    Alcohol abuse    Alcohol dependence ([place]) 12/16/2012   Medical detox successful    Alcohol induced acute pancreatitis 01/27/736   Alcoholic cirrhosis of liver without ascites ([place]) 10/15/2016   Anxiety    Bipolar 1  disorder (HCC)    Cancer ([place])    cervical cancer   Chest pain with moderate risk for cardiac etiology 12/15/2014   Cirrhosis ([place])    COPD (chronic obstructive pulmonary disease) ([place]) 09/04/2016   Coronary artery disease    Depression    Duodenitis 11/22/2017   Eczema 10/09/2018   Elevated liver enzymes 12/22/2014   Gastritis and gastroduodenitis 09/04/2016   GERD (gastroesophageal reflux disease)    Nondisplaced fracture of neck of fifth metacarpal bone, right hand, initial encounter for closed fracture 08/26/2017   Pancreatitis    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   PTSD (post-traumatic stress disorder)    Tobacco abuse 09/04/2016   Tobacco abuse counseling    Vascular disease     Patient Active Problem List   Diagnosis Date Noted   Hypertension 03/16/2021   Smoker 03/16/2021   History of carotid endarterectomy 03/16/2021   Coronary artery disease 03/02/2021   Depression 03/02/2021   Carotid artery stenosis 01/26/2021   Anxiety    Cancer (HCC)    Cirrhosis (HCC)    GERD (gastroesophageal reflux disease)    Pancreatitis    PTSD (post-traumatic stress disorder)    Vascular disease    Acute on chronic pancreatitis ([place]) 03/22/2019   Bipolar 1 disorder ([place]) 01/13/2019   Eczema 10/09/2018   Acute pancreatitis    Alcohol induced acute pancreatitis 11/22/2017   Duodenitis 11/22/2017   Tobacco abuse counseling    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   Nondisplaced fracture of neck of fifth  metacarpal bone, right hand, initial encounter for closed fracture 38/46/6599   Alcoholic cirrhosis of liver without ascites ([place]) 10/15/2016   Alcohol abuse 09/04/2016   Gastritis and gastroduodenitis 09/04/2016   Tobacco abuse 09/04/2016   COPD (chronic obstructive pulmonary disease) ([place]) 09/04/2016   Elevated liver enzymes 12/22/2014   Abdominal pain, epigastric 12/16/2014   Chest pain with moderate risk for cardiac etiology 12/15/2014   Alcohol dependence ([place]) 12/16/2012    Class: Acute     Past Surgical History:  Procedure Laterality Date   ABDOMINAL HYSTERECTOMY  2004   BIOPSY  01/02/2018   Procedure: BIOPSY;  Surgeon: Milus Banister, MD;  Location: [facility];  Service: Endoscopy;;   COLONOSCOPY     Done in [place] she thinks. Early 20's. Normal   ENDARTERECTOMY Right 01/26/2021   Procedure: RIGHT CAROTID ARTERY ENDARTERECTOMY;  Surgeon: Waynetta Sandy, MD;  Location: [facility];  Service: Vascular;  Laterality: Right;   ESOPHAGOGASTRODUODENOSCOPY (EGD) WITH PROPOFOL N/A 01/02/2018   Procedure: ESOPHAGOGASTRODUODENOSCOPY (EGD) WITH PROPOFOL;  Surgeon: Milus Banister, MD;  [facility];  Service: Endoscopy;  Laterality: N/A;   EUS N/A 01/02/2018   Procedure: UPPER ENDOSCOPIC ULTRASOUND (EUS) RADIAL;  Surgeon: Milus Banister, MD;  [facility];  Service: Endoscopy;  Laterality: N/A;   FOOT SURGERY     OTHER SURGICAL HISTORY     ovarian surgery   TUBAL LIGATION      OB History   No obstetric history on file.      Home Medications    Prior to Admission medications   Medication Sig Start Date End Date Taking? Authorizing Provider  amLODipine (NORVASC) 2.5 MG tablet Take 1 tablet (2.5 mg total) by mouth daily. 03/15/21 03/13/22 Yes Tobb, Kardie, DO  clopidogrel (PLAVIX) 75 MG tablet TAKE 1 TABLET BY MOUTH EVERY DAY 11/23/21  Yes Angelia Mould, MD  gabapentin (NEURONTIN) 300 MG capsule Take 1 capsule (300 mg total) by mouth 3 (three) times daily for 7 days. 12/29/21 03/13/22 Yes McDonald, Adam R, DPM  mometasone-formoterol (DULERA) 200-5 MCG/ACT AERO Inhale 2 puffs into the lungs daily as needed. 11/01/21  Yes Charlott Rakes, MD  rosuvastatin (CRESTOR) 10 MG tablet TAKE 1 TABLET BY MOUTH EVERY DAY 11/21/21  Yes Angelia Mould, MD  albuterol (VENTOLIN HFA) 108 (90 Base) MCG/ACT inhaler Inhale 1-2 puffs into the lungs every 6 (six) hours as needed for wheezing or shortness of breath. 11/01/21   Charlott Rakes, MD  cetirizine  (ZYRTEC) 10 MG tablet Take 1 tablet (10 mg total) by mouth daily. 11/01/21   Charlott Rakes, MD  famotidine (PEPCID) 20 MG tablet Take 1 tablet (20 mg total) by mouth 2 (two) times daily. Patient taking differently: Take 20 mg by mouth daily. 08/24/20   Argentina Donovan, PA-C  fluticasone (FLONASE) 50 MCG/ACT nasal spray Place 2 sprays into both nostrils daily. 11/01/21   Charlott Rakes, MD  hydrOXYzine (ATARAX/VISTARIL) 25 MG tablet Take 25 mg by mouth daily as needed for itching. 12/09/20   [provider]  lidocaine (XYLOCAINE) 2 % solution Use as directed 15 mLs in the mouth or throat every 6 (six) hours as needed for mouth pain. 03/13/22  Yes Carvel Getting, NP  olopatadine (PATANOL) 0.1 % ophthalmic solution Place 1 drop into both eyes 2 (two) times daily. 11/01/21   Charlott Rakes, MD  ondansetron (ZOFRAN) 4 MG tablet Take 1 tablet (4 mg total) by mouth every 8 (eight) hours as needed for nausea or  vomiting. 03/13/22   Carvel Getting, NP  pantoprazole (PROTONIX) 40 MG tablet Take 1 tablet (40 mg total) by mouth daily. 03/13/22   Carvel Getting, NP  cloNIDine (CATAPRES) 0.1 MG tablet Take 1 tablet (0.1 mg total) by mouth at bedtime. For hot flashes Patient not taking: Reported on 10/17/2019 01/13/19 08/15/20  Charlott Rakes, MD    Family History Family History  Problem Relation Age of Onset   Hypertension Mother    Breast cancer Mother 35   Pancreatitis Mother    Hypertension Father    Heart attack Cousin 65       died in 82s of MI   Breast cancer Maternal Aunt    Breast cancer Maternal Aunt    Colon cancer Neg Hx    Esophageal cancer Neg Hx    Rectal cancer Neg Hx    Stomach cancer Neg Hx     Social History Social History   Tobacco Use   Smoking status: Every Day    Packs/day: 0.50    Types: Cigarettes   Smokeless tobacco: Never   Tobacco comments:    2-3 cigarettes daily  Vaping Use   Vaping Use: Never used  Substance Use Topics   Alcohol use: Not Currently     Comment: clean for 27 days   Drug use: No     Allergies   Patient has no known allergies.   Review of Systems Review of Systems  Gastrointestinal:  Positive for abdominal pain, constipation and vomiting.     Physical Exam Triage Vital Signs ED Triage Vitals  Enc Vitals Group     BP 03/13/22 1438 (!) 134/97     Pulse Rate 03/13/22 1438 79     Resp 03/13/22 1438 16     Temp 03/13/22 1438 97.9 F (36.6 C)     Temp Source 03/13/22 1438 Oral     SpO2 03/13/22 1438 98 %     Weight 03/13/22 1442 111 lb (50.3 kg)     Height 03/13/22 1442 '5\' 4"'$  (1.626 m)     Head Circumference --      [place] Flow --      Pain Score 03/13/22 1442 6     Pain Loc --      Pain Edu? --      Excl. in [place]? --    No data found.  Updated Vital Signs BP (!) 134/97 (BP Location: Left Arm)   Pulse 79   Temp 97.9 F (36.6 C) (Oral)   Resp 16   Ht '5\' 4"'$  (1.626 m)   Wt 111 lb (50.3 kg)   SpO2 98%   BMI 19.05 kg/m   Visual Acuity Right Eye Distance:   Left Eye Distance:   Bilateral Distance:    Right Eye Near:   Left Eye Near:    Bilateral Near:     Physical Exam Constitutional:      Appearance: She is well-developed. She is not ill-appearing.  Cardiovascular:     Rate and Rhythm: Normal rate and regular rhythm.  Pulmonary:     Effort: Pulmonary effort is normal.     Breath sounds: Normal breath sounds.  Abdominal:     General: Abdomen is flat. Bowel sounds are normal. There is no distension.     Palpations: Abdomen is soft.     Tenderness: There is abdominal tenderness in the epigastric area and left upper quadrant. There is no guarding or rebound.  Neurological:     Mental Status:  She is alert.      UC Treatments / Results  Labs (all labs ordered are listed, but only abnormal results are displayed) Labs Reviewed - No data to display  EKG   Radiology No results found.  Procedures Procedures (including critical care time)  Medications Ordered in UC Medications - No data  to display  Initial Impression / Assessment and Plan / UC Course  I have reviewed the triage vital signs and the nursing notes.  Pertinent labs & imaging results that were available during my care of the patient were reviewed by me and considered in my medical decision making (see chart for details).    Refilled med rx's as requested. Discussed strategies for dealing with constipation, rationale for bland diet. Pt to f/u with GI.   Final Clinical Impressions(s) / UC Diagnoses   Final diagnoses:  Epigastric pain  Nausea and vomiting, unspecified vomiting type  Constipation, unspecified constipation type     Discharge Instructions      Use Mylanta per package instructions.  You can use the lidocaine to help with the pain for a day or so.  Use the nausea medicine only if needed.  Take the pantoprazole every day.  Follow-up with your GI as scheduled.  Try prune juice or prunes and high-fiber foods for your constipation.   ED Prescriptions     Medication Sig Dispense Auth. Provider   pantoprazole (PROTONIX) 40 MG tablet Take 1 tablet (40 mg total) by mouth daily. 30 tablet Carvel Getting, NP   lidocaine (XYLOCAINE) 2 % solution Use as directed 15 mLs in the mouth or throat every 6 (six) hours as needed for mouth pain. 100 mL Carvel Getting, NP   ondansetron (ZOFRAN) 4 MG tablet Take 1 tablet (4 mg total) by mouth every 8 (eight) hours as needed for nausea or vomiting. 12 tablet Carvel Getting, NP      PDMP not reviewed this encounter.   Carvel Getting, NP 03/13/22 1525

## 2022-03-29 ENCOUNTER — Ambulatory Visit (INDEPENDENT_AMBULATORY_CARE_PROVIDER_SITE_OTHER): Payer: Medicaid Other | Admitting: Podiatry

## 2022-03-29 DIAGNOSIS — Z91199 Patient's noncompliance with other medical treatment and regimen due to unspecified reason: Secondary | ICD-10-CM

## 2022-03-29 NOTE — Progress Notes (Signed)
Patient was no-show for appointment today 

## 2022-04-05 ENCOUNTER — Ambulatory Visit: Payer: Medicaid Other | Admitting: Nurse Practitioner

## 2022-04-05 NOTE — Progress Notes (Deleted)
Chief Complaint:  ***   Assessment &  Plan      HPI   Felicia Perkins is a 46 y.o. female known to Dr.  Hilarie Fredrickson with a past medical history significant for chronic alcoholic pancreatitis, ? PUD See PMH /PSH for additional history   GI history:  Felicia Perkins was seen here May 2019 following an admission for alcoholic pancreatitis.  A CT scan from 11/19/2017 raised concern for penetrating duodenal ulcer with secondary pancreatitis versus acute pancreatitis with small acute peripancreatic collection.  For further evaluation she underwent an MRI 12/18/2017 which showed a hypervascular lesion in the head of the pancreas contiguous with the adjacent duodenum.  Possibilities included a small serous cystadenoma or other pancreatic neoplasm.  Penetrating ulcer from the duodenum was felt less likely.  Open dictation box.  She underwent an EUS by Dr. Ardis Hughs negative for frank ulcers in the UGI tract. Limited visualization of the ampulla, head of pancreas however no clear tumors or masses Dr. Ardis Hughs was most suspicious that she had NSAID related ulcer of the duodenum that has since healed since completely stopping NSAIDs and taking PPI . Follow-up CT scan done 03/21/2018 with findings of interval resolution of the enhancing lesion in the head of pancreas.  She was rehospitalized mid August 2020 with abdominal pain.  CT scan w/  03/22/2019 with findings of inflammatory changes surrounding the pancreatic head associated with underlying parenchymal calcifications consistent with acute on chronic pancreatitis.    10/13/29 office follow up note Having abdominal pain and nausea over the last 2 months,  different than pancreatitis. She is drinking about 28 ounces of beer every day compared to 80 ounces daily last year. She took Advil for few days last month for back pain.   She is taking both Protonix 40 mg and famotidine 20 mg every morning.   She tries to stay busy to keep her mind off drinking.  Her weight is down from  112 pounds in August to 100 pounds today. [name] complains of constipation occurring about twice a month.  To evaluate upper abdominal pain she had an EGD which was normal.   April 2023 office visit CTAP with contrast showed subtle nodularity of the liver border.  She was trying to stop the alcohol.  She was still having mid upper abdominal pain, worse with p.o. intake.  Still with chronic constipation.  She had some rectal bleeding several days prior and went to ED where hemorrhoids were found Multiple labs obtained to evaluate etiology of chronic liver disease.  She was scheduled for colonoscopy.  Even though EGD in March 2021 did not show evidence for portal hypertension, she also had a repeat EGD at time of colonoscopy.  EGD was normal. A 3 mm polyp in the ascending colon, removed with a cold snare. Diverticulosis  ANA was positive with titer of 1:320.  Smooth muscle antibody and AMA negative.  HCV antibody negative.  She was immune to hepatitis A, not immune to hepatitis B.   INR was mildly elevated at 1.1, AFP mildly elevated at 10.3.   Interval History:        Previous GI Evaluation   10/20/19 EGD  - Normal esophagus. - Normal stomach. Biopsied. - Normal examined duodenum.   Imaging   12/11/21 Elastrography: ULTRASOUND RUQ:   Unremarkable sonographic appearance of liver.   6 mm gallbladder polyp. No evidence of cholelithiasis or biliary ductal dilatation.   ULTRASOUND HEPATIC ELASTOGRAPHY:   Median kPa:  5.5  Diagnostic category: < or = 9 kPa: in the absence of other known clinical signs, rules out cACLD    June 2023  Fibrosure 2 mo ago (01/24/22) 2 mo ago (01/24/22) 5 mo ago (11/06/21) 5 mo ago (11/06/21) 9 mo ago (06/22/21) 1 yr ago (01/30/21) 1 yr ago (01/27/21)    Fibrosis Score 0.00 - 0.21 0.25 High   0.25 High         Fibrosis Stage  F0-F1 VC  F0-F1        Steatosis Score 0.00 - 0.30 0.57 High   0.77 High  R        Steatosis Grade  Comment  Comment CM         Comment:        S1 - S2  Minimal Steatosis - Moderate Steatosis  NASH Score 0.25 0.25  0.90 High  R        NASH Grade  Comment  Comment CM        Comment:                      N0 - Not NASH       Labs:     Latest Ref Rng & Units 11/06/2021   10:46 AM 06/22/2021    3:17 PM 01/30/2021    9:28 AM  CBC  WBC 3.4 - 10.8 x10E3/uL 7.8  7.0  9.2   Hemoglobin 11.1 - 15.9 g/dL 12.6  12.3  13.1   Hematocrit 34.0 - 46.6 % 38.7  35.5  38.8   Platelets 150 - 450 x10E3/uL 244  224  256        Latest Ref Rng & Units 11/06/2021   10:46 AM 06/22/2021    3:17 PM 01/30/2021    9:28 AM  Hepatic Function  Total Protein 6.0 - 8.5 g/dL 7.2  6.5  6.4   Albumin 3.8 - 4.8 g/dL 4.7  3.7  3.3   AST 0 - 40 IU/L 81  27  126   ALT 0 - 32 IU/L 49  24  97   Alk Phosphatase 44 - 121 IU/L 161  107  139   Total Bilirubin 0.0 - 1.2 mg/dL 0.2  0.8  0.8      Past Medical History:  Diagnosis Date   Abdominal pain, epigastric 12/16/2014   Acute on chronic pancreatitis ([place]) 03/22/2019   Acute pancreatitis    Alcohol abuse    Alcohol dependence ([place]) 12/16/2012   Medical detox successful    Alcohol induced acute pancreatitis 9/44/9675   Alcoholic cirrhosis of liver without ascites ([place]) 10/15/2016   Anxiety    Bipolar 1 disorder ([place])    Cancer ([place])    cervical cancer   Chest pain with moderate risk for cardiac etiology 12/15/2014   Cirrhosis ([place])    COPD (chronic obstructive pulmonary disease) ([place]) 09/04/2016   Coronary artery disease    Depression    Duodenitis 11/22/2017   Eczema 10/09/2018   Elevated liver enzymes 12/22/2014   Gastritis and gastroduodenitis 09/04/2016   GERD (gastroesophageal reflux disease)    Nondisplaced fracture of neck of fifth metacarpal bone, right hand, initial encounter for closed fracture 08/26/2017   Pancreatitis    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   PTSD (post-traumatic stress disorder)    Tobacco abuse 09/04/2016   Tobacco abuse counseling    Vascular disease     Past  Surgical History:  Procedure Laterality Date   ABDOMINAL HYSTERECTOMY  2004  BIOPSY  01/02/2018   Procedure: BIOPSY;  Surgeon: Milus Banister, MD;  Location: [facility];  Service: Endoscopy;;   COLONOSCOPY     Done in [place] she thinks. Early 20's. Normal   ENDARTERECTOMY Right 01/26/2021   Procedure: RIGHT CAROTID ARTERY ENDARTERECTOMY;  Surgeon: Waynetta Sandy, MD;  Location: [facility];  Service: Vascular;  Laterality: Right;   ESOPHAGOGASTRODUODENOSCOPY (EGD) WITH PROPOFOL N/A 01/02/2018   Procedure: ESOPHAGOGASTRODUODENOSCOPY (EGD) WITH PROPOFOL;  Surgeon: Milus Banister, MD;  [facility];  Service: Endoscopy;  Laterality: N/A;   EUS N/A 01/02/2018   Procedure: UPPER ENDOSCOPIC ULTRASOUND (EUS) RADIAL;  Surgeon: Milus Banister, MD;  [facility];  Service: Endoscopy;  Laterality: N/A;   FOOT SURGERY     OTHER SURGICAL HISTORY     ovarian surgery   TUBAL LIGATION      Current Medications, Allergies, Family History and Social History were reviewed in [employer] record.     Current Outpatient Medications  Medication Sig Dispense Refill   albuterol (VENTOLIN HFA) 108 (90 Base) MCG/ACT inhaler Inhale 1-2 puffs into the lungs every 6 (six) hours as needed for wheezing or shortness of breath. 8.5 g 2   amLODipine (NORVASC) 2.5 MG tablet Take 1 tablet (2.5 mg total) by mouth daily. 90 tablet 3   cetirizine (ZYRTEC) 10 MG tablet Take 1 tablet (10 mg total) by mouth daily. 30 tablet 1   clopidogrel (PLAVIX) 75 MG tablet TAKE 1 TABLET BY MOUTH EVERY DAY 30 tablet 11   famotidine (PEPCID) 20 MG tablet Take 1 tablet (20 mg total) by mouth 2 (two) times daily. (Patient taking differently: Take 20 mg by mouth daily.) 60 tablet 2   fluticasone (FLONASE) 50 MCG/ACT nasal spray Place 2 sprays into both nostrils daily. 16 g 6   gabapentin (NEURONTIN) 300 MG capsule Take 1 capsule (300 mg total) by mouth 3 (three) times daily for 7 days. 21  capsule 0   hydrOXYzine (ATARAX/VISTARIL) 25 MG tablet Take 25 mg by mouth daily as needed for itching.     lidocaine (XYLOCAINE) 2 % solution Use as directed 15 mLs in the mouth or throat every 6 (six) hours as needed for mouth pain. 100 mL 0   mometasone-formoterol (DULERA) 200-5 MCG/ACT AERO Inhale 2 puffs into the lungs daily as needed. 13 g 3   olopatadine (PATANOL) 0.1 % ophthalmic solution Place 1 drop into both eyes 2 (two) times daily. 5 mL 6   ondansetron (ZOFRAN) 4 MG tablet Take 1 tablet (4 mg total) by mouth every 8 (eight) hours as needed for nausea or vomiting. 12 tablet 0   pantoprazole (PROTONIX) 40 MG tablet Take 1 tablet (40 mg total) by mouth daily. 30 tablet 0   rosuvastatin (CRESTOR) 10 MG tablet TAKE 1 TABLET BY MOUTH EVERY DAY 30 tablet 11   No current facility-administered medications for this visit.    Review of Systems: No chest pain. No shortness of breath. No urinary complaints.    Physical Exam  Wt Readings from Last 3 Encounters:  03/13/22 111 lb (50.3 kg)  01/24/22 101 lb (45.8 kg)  12/20/21 102 lb (46.3 kg)    There were no vitals taken for this visit. Constitutional:  Generally well appearing ***female in no acute distress. Psychiatric: Pleasant. Normal mood and affect. Behavior is normal. EENT: Pupils normal.  Conjunctivae are normal. No scleral icterus. Neck supple.  Cardiovascular: Normal rate, regular rhythm. No edema Pulmonary/chest: Effort normal and breath  sounds normal. No wheezing, rales or rhonchi. Abdominal: Soft, nondistended, nontender. Bowel sounds active throughout. There are no masses palpable. No hepatomegaly. Neurological: Alert and oriented to person place and time. Skin: Skin is warm and dry. No rashes noted.  Tye Savoy, NP  04/05/2022, 12:53 PM  Cc:  Charlott Rakes, MD

## 2022-05-04 ENCOUNTER — Encounter (HOSPITAL_COMMUNITY): Payer: Self-pay

## 2022-05-04 ENCOUNTER — Other Ambulatory Visit: Payer: Self-pay

## 2022-05-04 ENCOUNTER — Emergency Department (HOSPITAL_COMMUNITY)
Admission: EM | Admit: 2022-05-04 | Discharge: 2022-05-04 | Disposition: A | Discharge status: Home / Self Care | Payer: Medicaid Other | Attending: Emergency Medicine | Admitting: Emergency Medicine

## 2022-05-04 DIAGNOSIS — K859 Acute pancreatitis without necrosis or infection, unspecified: Secondary | ICD-10-CM | POA: Insufficient documentation

## 2022-05-04 DIAGNOSIS — I10 Essential (primary) hypertension: Secondary | ICD-10-CM | POA: Diagnosis not present

## 2022-05-04 DIAGNOSIS — N9489 Other specified conditions associated with female genital organs and menstrual cycle: Secondary | ICD-10-CM | POA: Diagnosis not present

## 2022-05-04 DIAGNOSIS — K5900 Constipation, unspecified: Secondary | ICD-10-CM | POA: Insufficient documentation

## 2022-05-04 DIAGNOSIS — Z79899 Other long term (current) drug therapy: Secondary | ICD-10-CM | POA: Insufficient documentation

## 2022-05-04 DIAGNOSIS — Z8541 Personal history of malignant neoplasm of cervix uteri: Secondary | ICD-10-CM | POA: Diagnosis not present

## 2022-05-04 DIAGNOSIS — Z7902 Long term (current) use of antithrombotics/antiplatelets: Secondary | ICD-10-CM | POA: Diagnosis not present

## 2022-05-04 DIAGNOSIS — I2510 Atherosclerotic heart disease of native coronary artery without angina pectoris: Secondary | ICD-10-CM | POA: Diagnosis not present

## 2022-05-04 DIAGNOSIS — R109 Unspecified abdominal pain: Secondary | ICD-10-CM | POA: Diagnosis present

## 2022-05-04 DIAGNOSIS — J449 Chronic obstructive pulmonary disease, unspecified: Secondary | ICD-10-CM | POA: Insufficient documentation

## 2022-05-04 LAB — URINALYSIS, ROUTINE W REFLEX MICROSCOPIC
Bilirubin Urine: NEGATIVE
Glucose, UA: NEGATIVE mg/dL
Hgb urine dipstick: NEGATIVE
Ketones, ur: NEGATIVE mg/dL
Leukocytes,Ua: NEGATIVE
Nitrite: NEGATIVE
Protein, ur: NEGATIVE mg/dL
Specific Gravity, Urine: 1.01 (ref 1.005–1.030)
pH: 7 (ref 5.0–8.0)

## 2022-05-04 LAB — CBC
HCT: 45.7 % (ref 36.0–46.0)
Hemoglobin: 15.3 g/dL — ABNORMAL HIGH (ref 12.0–15.0)
MCH: 32.8 pg (ref 26.0–34.0)
MCHC: 33.5 g/dL (ref 30.0–36.0)
MCV: 97.9 fL (ref 80.0–100.0)
Platelets: 278 10*3/uL (ref 150–400)
RBC: 4.67 MIL/uL (ref 3.87–5.11)
RDW: 13.8 % (ref 11.5–15.5)
WBC: 10.6 10*3/uL — ABNORMAL HIGH (ref 4.0–10.5)
nRBC: 0 % (ref 0.0–0.2)

## 2022-05-04 LAB — COMPREHENSIVE METABOLIC PANEL
ALT: 11 U/L (ref 0–44)
AST: 26 U/L (ref 15–41)
Albumin: 4.4 g/dL (ref 3.5–5.0)
Alkaline Phosphatase: 101 U/L (ref 38–126)
Anion gap: 10 (ref 5–15)
BUN: 6 mg/dL (ref 6–20)
CO2: 24 mmol/L (ref 22–32)
Calcium: 10 mg/dL (ref 8.9–10.3)
Chloride: 105 mmol/L (ref 98–111)
Creatinine, Ser: 0.56 mg/dL (ref 0.44–1.00)
GFR, Estimated: >60 mL/min (ref >60)
Glucose, Bld: 101 mg/dL — ABNORMAL HIGH (ref 70–99)
Potassium: 4.2 mmol/L (ref 3.5–5.1)
Sodium: 139 mmol/L (ref 135–145)
Total Bilirubin: 0.7 mg/dL (ref 0.3–1.2)
Total Protein: 8 g/dL (ref 6.5–8.1)

## 2022-05-04 LAB — LACTIC ACID, PLASMA: Lactic Acid, Venous: 1 mmol/L (ref 0.5–1.9)

## 2022-05-04 LAB — LIPASE, BLOOD: Lipase: 780 U/L — ABNORMAL HIGH (ref 11–51)

## 2022-05-04 LAB — I-STAT BETA HCG BLOOD, ED (MC, WL, AP ONLY): I-stat hCG, quantitative: <5 m[IU]/mL (ref <5)

## 2022-05-04 MED ORDER — MORPHINE SULFATE (PF) 4 MG/ML IV SOLN
4.0000 mg | Freq: Once | INTRAVENOUS | Status: AC
  Administered 2022-05-04: 4 mg via INTRAVENOUS
  Filled 2022-05-04: qty 1

## 2022-05-04 MED ORDER — OXYCODONE-ACETAMINOPHEN 5-325 MG PO TABS: 1.0000 | ORAL_TABLET | Freq: Four times a day (QID) | ORAL | 0 refills | Status: DC | PRN

## 2022-05-04 MED ORDER — ONDANSETRON HCL 4 MG/2ML IJ SOLN
4.0000 mg | Freq: Once | INTRAMUSCULAR | Status: AC
  Administered 2022-05-04: 4 mg via INTRAVENOUS
  Filled 2022-05-04: qty 2

## 2022-05-04 MED ORDER — LACTATED RINGERS IV BOLUS
1000.0000 mL | Freq: Once | INTRAVENOUS | Status: AC
  Administered 2022-05-04: 1000 mL via INTRAVENOUS

## 2022-05-04 MED ORDER — ONDANSETRON HCL 4 MG PO TABS: 4.0000 mg | ORAL_TABLET | Freq: Three times a day (TID) | ORAL | 0 refills | Status: DC | PRN

## 2022-05-04 MED ORDER — ONDANSETRON HCL 4 MG PO TABS: 4.0000 mg | ORAL_TABLET | Freq: Three times a day (TID) | ORAL | 0 refills | Status: AC | PRN

## 2022-05-04 MED ORDER — OXYCODONE-ACETAMINOPHEN 5-325 MG PO TABS: 1.0000 | ORAL_TABLET | Freq: Four times a day (QID) | ORAL | 0 refills | Status: AC | PRN

## 2022-05-04 NOTE — ED Triage Notes (Addendum)
Patient c/o mid abdominal pain, N/V since early this AM.  Patient reports a history of pancreatitis.

## 2022-05-04 NOTE — ED Provider Notes (Signed)
[facility] DEPT Provider Note   CSN: [id number] Arrival date & time: 05/04/22  1019     History  Chief Complaint  Patient presents with   Abdominal Pain    Felicia Perkins is a 46 y.o. female with cirrhosis, h/o pancreatitis, tobacco use, alcohol abuse, gastritis, COPD, bipolar 1, history cervical cancer, GERD, HTN, PTSD, CAD h/o carotid endarterectomy presents with abd pain.  Woke up at 0000 AM this morning with abd pain, took an antacid and went back to sleep. Went to work this AM and had pain with walking, N/V yellow thick mucous. Had a case of pancreatitis 3 years ago which was similar to this. Pain is constant, worst in epigastric region but whole abdomen feels "sore," rated 9/10, radiates to lower back. Denies fever but endorses chills last night. Denies CP, SOB, dysuria/hematuria, vaginal symptoms, diarrhea. Endorses mild constipation. Able to pass gas. Last BM was two days ago, normal for her, denies hematochezia/melena.     Abdominal Pain      Home Medications Prior to Admission medications   Medication Sig Start Date End Date Taking? Authorizing Provider  albuterol (VENTOLIN HFA) 108 (90 Base) MCG/ACT inhaler Inhale 1-2 puffs into the lungs every 6 (six) hours as needed for wheezing or shortness of breath. 11/01/21   Charlott Rakes, MD  amLODipine (NORVASC) 2.5 MG tablet Take 1 tablet (2.5 mg total) by mouth daily. 03/15/21 03/13/22  Tobb, Kardie, DO  cetirizine (ZYRTEC) 10 MG tablet Take 1 tablet (10 mg total) by mouth daily. 11/01/21   Charlott Rakes, MD  clopidogrel (PLAVIX) 75 MG tablet TAKE 1 TABLET BY MOUTH EVERY DAY 11/23/21   Angelia Mould, MD  famotidine (PEPCID) 20 MG tablet Take 1 tablet (20 mg total) by mouth 2 (two) times daily. Patient taking differently: Take 20 mg by mouth daily. 08/24/20   Argentina Donovan, PA-C  fluticasone (FLONASE) 50 MCG/ACT nasal spray Place 2 sprays into both nostrils daily. 11/01/21   Charlott Rakes, MD  gabapentin (NEURONTIN) 300 MG capsule Take 1 capsule (300 mg total) by mouth 3 (three) times daily for 7 days. 12/29/21 03/13/22  McDonald, Stephan Minister, DPM  hydrOXYzine (ATARAX/VISTARIL) 25 MG tablet Take 25 mg by mouth daily as needed for itching. 12/09/20   [provider]  lidocaine (XYLOCAINE) 2 % solution Use as directed 15 mLs in the mouth or throat every 6 (six) hours as needed for mouth pain. 03/13/22   Carvel Getting, NP  mometasone-formoterol (DULERA) 200-5 MCG/ACT AERO Inhale 2 puffs into the lungs daily as needed. 11/01/21   Charlott Rakes, MD  olopatadine (PATANOL) 0.1 % ophthalmic solution Place 1 drop into both eyes 2 (two) times daily. 11/01/21   Charlott Rakes, MD  ondansetron (ZOFRAN) 4 MG tablet Take 1 tablet (4 mg total) by mouth every 8 (eight) hours as needed for up to 3 days for nausea or vomiting. 05/04/22 05/07/22  Audley Hose, MD  pantoprazole (PROTONIX) 40 MG tablet Take 1 tablet (40 mg total) by mouth daily. 03/13/22   Carvel Getting, NP  rosuvastatin (CRESTOR) 10 MG tablet TAKE 1 TABLET BY MOUTH EVERY DAY 11/21/21   Angelia Mould, MD  cloNIDine (CATAPRES) 0.1 MG tablet Take 1 tablet (0.1 mg total) by mouth at bedtime. For hot flashes Patient not taking: Reported on 10/17/2019 01/13/19 08/15/20  Charlott Rakes, MD      Allergies    Patient has no known allergies.    Review of Systems  Review of Systems  Gastrointestinal:  Positive for abdominal pain.   Review of systems negative for fevers chills.  A 10 point review of systems was performed and is negative unless otherwise reported in HPI.  Physical Exam Updated Vital Signs BP 113/83   Pulse 66   Temp 98.3 F (36.8 C) (Oral)   Resp 17   Ht '5\' 4"'$  (1.626 m)   Wt 50.3 kg   SpO2 98%   BMI 19.05 kg/m  Physical Exam General: Normal appearing female, lying in bed.  HEENT: PERRLA, Sclera anicteric, MMM, trachea midline. Cardiology: RRR, no murmurs/rubs/gallops. BL radial and DP pulses equal  bilaterally.  Resp: Normal respiratory rate and effort. CTAB, no wheezes, rhonchi, crackles.  Abd: Abdomen tender in epigastric region.  Patient with soft, non-distended. No rebound tenderness or guarding.  GU: Deferred. MSK: No peripheral edema or signs of trauma. Extremities without deformity or TTP. No cyanosis or clubbing. Skin: warm, dry. No rashes or lesions. Neuro: A&Ox4, CNs II-XII grossly intact. MAEs. Sensation grossly intact.  Psych: Normal mood and affect.   ED Results / Procedures / Treatments   Labs (all labs ordered are listed, but only abnormal results are displayed) Labs Reviewed  LIPASE, BLOOD - Abnormal; Notable for the following components:      Result Value   Lipase 780 (*)    All other components within normal limits  COMPREHENSIVE METABOLIC PANEL - Abnormal; Notable for the following components:   Glucose, Bld 101 (*)    All other components within normal limits  CBC - Abnormal; Notable for the following components:   WBC 10.6 (*)    Hemoglobin 15.3 (*)    All other components within normal limits  URINALYSIS, ROUTINE W REFLEX MICROSCOPIC  LACTIC ACID, PLASMA  I-STAT BETA HCG BLOOD, ED ([facility], [facility], [facility] ONLY)    Procedures Procedures    Medications Ordered in ED Medications  morphine (PF) 4 MG/ML injection 4 mg (4 mg Intravenous Given 05/04/22 1159)  ondansetron (ZOFRAN) injection 4 mg (4 mg Intravenous Given 05/04/22 1159)  lactated ringers bolus 1,000 mL (0 mLs Intravenous Stopped 05/04/22 1300)    ED Course/ Medical Decision Making/ A&P                          Medical Decision Making Amount and/or Complexity of Data Reviewed Labs: ordered. Decision-making details documented in ED Course.  Risk Prescription drug management.   Patient is HDS, overall well-appearing with epigastric TTP radiating to back w/ h/o pancreatitis.   For DDX for abdominal pain includes but is not limited to:  Abdominal exam without peritoneal signs. No evidence of acute  abdomen at this time. Overall low suspicion for acute surgical abdomen caused by acute hepatobiliary disease (including acute cholecystitis or cholangitis), gastric perforation, acute mesenteric ischemia, acute appendicitis, SBO, viscus perforation, or diverticulitis.  Will obtain labs including lipase to evaluate for pancreatitis and lactate with CMP/UA.  I have personally reviewed and interpreted all labs.   Clinical Course as of 05/04/22 1557  Fri May 04, 2022  1307 Lipase(!): 780 E/o acute pancreatitis [HN]  1307 WBC(!): 10.6 [HN]  1308 I-stat hCG, quantitative: <5.0 [HN]  1308 Lactic Acid, Venous: 1.0 [HN]  1440 Urinalysis, Routine w reflex microscopic Urine, Clean Catch No UTI [HN]    Clinical Course User Index [HN] Audley Hose, MD   Patient's labs demonstrate pancreatitis with elevated lipase.  WBC mildly elevated to 10.6.  Lactate negative, clean  UA.  Patient improved after Zofran and morphine as well as 1 L fluid.  Patient able to take p.o.  Shared decision making with patient about admission versus discharge and patient states that she would prefer to be discharged at this time.  States that she thinks she can hydrate herself orally at home which she understands is very important.  Discussed with patient clear liquid diet and bowel rest as well as importance of oral hydration.  Patient also reports that she is chronically constipated and advised to take 1 capful of MiraLAX per day.  Patient will be prescribed a few days of Percocet as well as Zofran for pain control and given extensive discharge instructions and return precautions.  Advised to follow-up with primary care physician within 1 week.  Patient reports understanding, all patients answered to patient inspection.  Patient is hemodynamic stable and well-appearing at time of discharge.  Dispo: DC         Final Clinical Impression(s) / ED Diagnoses Final diagnoses:  Acute pancreatitis, unspecified complication status,  unspecified pancreatitis type  Constipation, unspecified constipation type    Rx / DC Orders ED Discharge Orders          Ordered    ondansetron (ZOFRAN) 4 MG tablet  Every 8 hours PRN        05/04/22 1549             This note was created using dictation software, which may contain spelling or grammatical errors.    Audley Hose, MD 05/04/22 [phone]

## 2022-05-04 NOTE — Discharge Instructions (Addendum)
Thank you for coming to [facility] Emergency Department. You were seen for abdominal pain, nausea, and vomiting. We did an exam, labs, and these showed pancreatitis. We have prescribed a few days of percocet and zofran to take for nausea and pain control. Please stay adequately hydrated and perform bowel rest for a few days.  Please follow up with your primary care provider within 1 week.  For your constipation, please take 1 capful of MiraLAX per day  Do not hesitate to return to the ED or call 911 if you experience: -Worsening symptoms -Inability to eat or drink -Lightheadedness, passing out -Fevers/chills -Anything else that concerns you

## 2022-05-05 ENCOUNTER — Other Ambulatory Visit: Payer: Self-pay

## 2022-05-05 MED ORDER — PANTOPRAZOLE SODIUM 40 MG PO TBEC: 40.0000 mg | DELAYED_RELEASE_TABLET | Freq: Every day | ORAL | 0 refills | Status: DC

## 2022-06-03 ENCOUNTER — Inpatient Hospital Stay (HOSPITAL_COMMUNITY)
Admission: EM | Admit: 2022-06-03 | Discharge: 2022-06-07 | DRG: 481 | Disposition: A | Discharge status: Home Health | Payer: Medicaid Other | Attending: Family Medicine | Admitting: Family Medicine

## 2022-06-03 ENCOUNTER — Emergency Department (HOSPITAL_COMMUNITY): Payer: Medicaid Other

## 2022-06-03 ENCOUNTER — Encounter (HOSPITAL_COMMUNITY): Payer: Self-pay | Admitting: Emergency Medicine

## 2022-06-03 DIAGNOSIS — S72009A Fracture of unspecified part of neck of unspecified femur, initial encounter for closed fracture: Secondary | ICD-10-CM | POA: Diagnosis present

## 2022-06-03 DIAGNOSIS — F4310 Post-traumatic stress disorder, unspecified: Secondary | ICD-10-CM | POA: Diagnosis present

## 2022-06-03 DIAGNOSIS — S72012A Unspecified intracapsular fracture of left femur, initial encounter for closed fracture: Secondary | ICD-10-CM | POA: Diagnosis present

## 2022-06-03 DIAGNOSIS — Z7902 Long term (current) use of antithrombotics/antiplatelets: Secondary | ICD-10-CM | POA: Diagnosis not present

## 2022-06-03 DIAGNOSIS — J449 Chronic obstructive pulmonary disease, unspecified: Secondary | ICD-10-CM | POA: Diagnosis present

## 2022-06-03 DIAGNOSIS — Y92009 Unspecified place in unspecified non-institutional (private) residence as the place of occurrence of the external cause: Secondary | ICD-10-CM | POA: Diagnosis not present

## 2022-06-03 DIAGNOSIS — S72002A Fracture of unspecified part of neck of left femur, initial encounter for closed fracture: Secondary | ICD-10-CM

## 2022-06-03 DIAGNOSIS — K2970 Gastritis, unspecified, without bleeding: Secondary | ICD-10-CM

## 2022-06-03 DIAGNOSIS — K7030 Alcoholic cirrhosis of liver without ascites: Secondary | ICD-10-CM | POA: Diagnosis present

## 2022-06-03 DIAGNOSIS — F17200 Nicotine dependence, unspecified, uncomplicated: Secondary | ICD-10-CM | POA: Diagnosis not present

## 2022-06-03 DIAGNOSIS — Z8541 Personal history of malignant neoplasm of cervix uteri: Secondary | ICD-10-CM | POA: Diagnosis not present

## 2022-06-03 DIAGNOSIS — Z7951 Long term (current) use of inhaled steroids: Secondary | ICD-10-CM

## 2022-06-03 DIAGNOSIS — Z681 Body mass index (BMI) 19 or less, adult: Secondary | ICD-10-CM

## 2022-06-03 DIAGNOSIS — Z79899 Other long term (current) drug therapy: Secondary | ICD-10-CM | POA: Diagnosis not present

## 2022-06-03 DIAGNOSIS — K219 Gastro-esophageal reflux disease without esophagitis: Secondary | ICD-10-CM | POA: Diagnosis present

## 2022-06-03 DIAGNOSIS — I10 Essential (primary) hypertension: Secondary | ICD-10-CM | POA: Diagnosis present

## 2022-06-03 DIAGNOSIS — W010XXA Fall on same level from slipping, tripping and stumbling without subsequent striking against object, initial encounter: Secondary | ICD-10-CM | POA: Diagnosis present

## 2022-06-03 DIAGNOSIS — Z803 Family history of malignant neoplasm of breast: Secondary | ICD-10-CM

## 2022-06-03 DIAGNOSIS — I2510 Atherosclerotic heart disease of native coronary artery without angina pectoris: Secondary | ICD-10-CM | POA: Diagnosis not present

## 2022-06-03 DIAGNOSIS — Z716 Tobacco abuse counseling: Secondary | ICD-10-CM | POA: Diagnosis not present

## 2022-06-03 DIAGNOSIS — F17210 Nicotine dependence, cigarettes, uncomplicated: Secondary | ICD-10-CM | POA: Diagnosis present

## 2022-06-03 DIAGNOSIS — Z8249 Family history of ischemic heart disease and other diseases of the circulatory system: Secondary | ICD-10-CM | POA: Diagnosis not present

## 2022-06-03 DIAGNOSIS — E785 Hyperlipidemia, unspecified: Secondary | ICD-10-CM | POA: Diagnosis present

## 2022-06-03 DIAGNOSIS — F419 Anxiety disorder, unspecified: Secondary | ICD-10-CM | POA: Diagnosis present

## 2022-06-03 DIAGNOSIS — Z634 Disappearance and death of family member: Secondary | ICD-10-CM

## 2022-06-03 DIAGNOSIS — E440 Moderate protein-calorie malnutrition: Secondary | ICD-10-CM | POA: Insufficient documentation

## 2022-06-03 DIAGNOSIS — F1010 Alcohol abuse, uncomplicated: Secondary | ICD-10-CM | POA: Diagnosis present

## 2022-06-03 HISTORY — DX: Fracture of unspecified part of neck of unspecified femur, initial encounter for closed fracture: S72.009A

## 2022-06-03 LAB — COMPREHENSIVE METABOLIC PANEL
ALT: 20 U/L (ref 0–44)
AST: 28 U/L (ref 15–41)
Albumin: 3.9 g/dL (ref 3.5–5.0)
Alkaline Phosphatase: 92 U/L (ref 38–126)
Anion gap: 16 — ABNORMAL HIGH (ref 5–15)
BUN: <5 mg/dL — ABNORMAL LOW (ref 6–20)
CO2: 20 mmol/L — ABNORMAL LOW (ref 22–32)
Calcium: 9 mg/dL (ref 8.9–10.3)
Chloride: 98 mmol/L (ref 98–111)
Creatinine, Ser: 0.56 mg/dL (ref 0.44–1.00)
GFR, Estimated: >60 mL/min (ref >60)
Glucose, Bld: 106 mg/dL — ABNORMAL HIGH (ref 70–99)
Potassium: 3.8 mmol/L (ref 3.5–5.1)
Sodium: 134 mmol/L — ABNORMAL LOW (ref 135–145)
Total Bilirubin: 0.5 mg/dL (ref 0.3–1.2)
Total Protein: 6.8 g/dL (ref 6.5–8.1)

## 2022-06-03 LAB — CBC
HCT: 39.9 % (ref 36.0–46.0)
Hemoglobin: 13.4 g/dL (ref 12.0–15.0)
MCH: 32.3 pg (ref 26.0–34.0)
MCHC: 33.6 g/dL (ref 30.0–36.0)
MCV: 96.1 fL (ref 80.0–100.0)
Platelets: 240 10*3/uL (ref 150–400)
RBC: 4.15 MIL/uL (ref 3.87–5.11)
RDW: 13.1 % (ref 11.5–15.5)
WBC: 9.7 10*3/uL (ref 4.0–10.5)
nRBC: 0 % (ref 0.0–0.2)

## 2022-06-03 LAB — I-STAT BETA HCG BLOOD, ED (MC, WL, AP ONLY): I-stat hCG, quantitative: <5 m[IU]/mL (ref <5)

## 2022-06-03 LAB — TYPE AND SCREEN
ABO/RH(D): B POS
Antibody Screen: NEGATIVE

## 2022-06-03 LAB — HIV ANTIBODY (ROUTINE TESTING W REFLEX): HIV Screen 4th Generation wRfx: NONREACTIVE

## 2022-06-03 LAB — VITAMIN D 25 HYDROXY (VIT D DEFICIENCY, FRACTURES): Vit D, 25-Hydroxy: 9.2 ng/mL — ABNORMAL LOW (ref 30–100)

## 2022-06-03 MED ORDER — ASPIRIN 81 MG PO TBEC
81.0000 mg | DELAYED_RELEASE_TABLET | Freq: Every day | ORAL | Status: DC
  Administered 2022-06-03 – 2022-06-05 (×3): 81 mg via ORAL
  Filled 2022-06-03 (×3): qty 1

## 2022-06-03 MED ORDER — FAMOTIDINE 20 MG PO TABS
20.0000 mg | ORAL_TABLET | Freq: Every day | ORAL | Status: DC
  Administered 2022-06-04 – 2022-06-07 (×4): 20 mg via ORAL
  Filled 2022-06-03 (×4): qty 1

## 2022-06-03 MED ORDER — CYCLOBENZAPRINE HCL 5 MG PO TABS
5.0000 mg | ORAL_TABLET | Freq: Three times a day (TID) | ORAL | Status: DC | PRN
  Administered 2022-06-03 – 2022-06-04 (×3): 5 mg via ORAL
  Filled 2022-06-03 (×3): qty 1

## 2022-06-03 MED ORDER — ENSURE ENLIVE PO LIQD
237.0000 mL | Freq: Two times a day (BID) | ORAL | Status: DC
  Administered 2022-06-06 (×3): 237 mL via ORAL

## 2022-06-03 MED ORDER — MORPHINE SULFATE (PF) 4 MG/ML IV SOLN
4.0000 mg | Freq: Once | INTRAVENOUS | Status: AC
  Administered 2022-06-03: 4 mg via INTRAVENOUS
  Filled 2022-06-03: qty 1

## 2022-06-03 MED ORDER — LORATADINE 10 MG PO TABS
10.0000 mg | ORAL_TABLET | Freq: Every day | ORAL | Status: DC
  Administered 2022-06-03 – 2022-06-07 (×5): 10 mg via ORAL
  Filled 2022-06-03 (×5): qty 1

## 2022-06-03 MED ORDER — FOLIC ACID 1 MG PO TABS
1.0000 mg | ORAL_TABLET | Freq: Every day | ORAL | Status: DC
  Administered 2022-06-03 – 2022-06-07 (×5): 1 mg via ORAL
  Filled 2022-06-03 (×5): qty 1

## 2022-06-03 MED ORDER — MORPHINE SULFATE (PF) 2 MG/ML IV SOLN
0.5000 mg | INTRAVENOUS | Status: DC | PRN
  Administered 2022-06-03 – 2022-06-05 (×3): 0.5 mg via INTRAVENOUS
  Filled 2022-06-03 (×3): qty 1

## 2022-06-03 MED ORDER — HYDROXYZINE HCL 25 MG PO TABS
25.0000 mg | ORAL_TABLET | Freq: Every day | ORAL | Status: DC | PRN
  Administered 2022-06-03 – 2022-06-06 (×4): 25 mg via ORAL
  Filled 2022-06-03 (×4): qty 1

## 2022-06-03 MED ORDER — THIAMINE MONONITRATE 100 MG PO TABS
100.0000 mg | ORAL_TABLET | Freq: Every day | ORAL | Status: DC
  Administered 2022-06-03 – 2022-06-07 (×5): 100 mg via ORAL
  Filled 2022-06-03 (×5): qty 1

## 2022-06-03 MED ORDER — ENOXAPARIN SODIUM 40 MG/0.4ML IJ SOSY: 40.0000 mg | PREFILLED_SYRINGE | INTRAMUSCULAR | Status: DC

## 2022-06-03 MED ORDER — ROSUVASTATIN CALCIUM 5 MG PO TABS
10.0000 mg | ORAL_TABLET | Freq: Every day | ORAL | Status: DC
  Administered 2022-06-04 – 2022-06-07 (×4): 10 mg via ORAL
  Filled 2022-06-03 (×4): qty 2

## 2022-06-03 MED ORDER — LORAZEPAM 1 MG PO TABS: 1.0000 mg | ORAL_TABLET | ORAL | Status: AC | PRN

## 2022-06-03 MED ORDER — ONDANSETRON HCL 4 MG/2ML IJ SOLN
4.0000 mg | Freq: Once | INTRAMUSCULAR | Status: AC
  Administered 2022-06-03: 4 mg via INTRAVENOUS
  Filled 2022-06-03: qty 2

## 2022-06-03 MED ORDER — LORAZEPAM 2 MG/ML IJ SOLN: 1.0000 mg | INTRAMUSCULAR | Status: AC | PRN

## 2022-06-03 MED ORDER — FLUTICASONE PROPIONATE 50 MCG/ACT NA SUSP
2.0000 | Freq: Every day | NASAL | Status: DC
  Administered 2022-06-04 – 2022-06-07 (×4): 2 via NASAL
  Filled 2022-06-03: qty 16

## 2022-06-03 MED ORDER — GABAPENTIN 300 MG PO CAPS
300.0000 mg | ORAL_CAPSULE | Freq: Three times a day (TID) | ORAL | Status: DC
  Administered 2022-06-03 (×2): 300 mg via ORAL
  Filled 2022-06-03 (×2): qty 1

## 2022-06-03 MED ORDER — ALBUTEROL SULFATE (2.5 MG/3ML) 0.083% IN NEBU: 3.0000 mL | INHALATION_SOLUTION | Freq: Four times a day (QID) | RESPIRATORY_TRACT | Status: DC | PRN

## 2022-06-03 MED ORDER — MOMETASONE FURO-FORMOTEROL FUM 200-5 MCG/ACT IN AERO
2.0000 | INHALATION_SPRAY | Freq: Two times a day (BID) | RESPIRATORY_TRACT | Status: DC
  Administered 2022-06-03 – 2022-06-07 (×7): 2 via RESPIRATORY_TRACT
  Filled 2022-06-03: qty 8.8

## 2022-06-03 MED ORDER — HYDROCODONE-ACETAMINOPHEN 5-325 MG PO TABS
1.0000 | ORAL_TABLET | Freq: Four times a day (QID) | ORAL | Status: DC | PRN
  Administered 2022-06-03 – 2022-06-05 (×5): 2 via ORAL
  Filled 2022-06-03 (×5): qty 2

## 2022-06-03 MED ORDER — AMLODIPINE BESYLATE 2.5 MG PO TABS
2.5000 mg | ORAL_TABLET | Freq: Every day | ORAL | Status: DC
  Administered 2022-06-03 – 2022-06-07 (×5): 2.5 mg via ORAL
  Filled 2022-06-03 (×5): qty 1

## 2022-06-03 MED ORDER — HYDROMORPHONE HCL 1 MG/ML IJ SOLN
0.5000 mg | Freq: Once | INTRAMUSCULAR | Status: AC
  Administered 2022-06-03: 0.5 mg via INTRAVENOUS
  Filled 2022-06-03: qty 1

## 2022-06-03 MED ORDER — INFLUENZA VAC SPLIT QUAD 0.5 ML IM SUSY: 0.5000 mL | PREFILLED_SYRINGE | INTRAMUSCULAR | Status: DC

## 2022-06-03 MED ORDER — ADULT MULTIVITAMIN W/MINERALS CH
1.0000 | ORAL_TABLET | Freq: Every day | ORAL | Status: DC
  Administered 2022-06-03 – 2022-06-07 (×5): 1 via ORAL
  Filled 2022-06-03 (×5): qty 1

## 2022-06-03 MED ORDER — OLOPATADINE HCL 0.1 % OP SOLN
1.0000 [drp] | Freq: Two times a day (BID) | OPHTHALMIC | Status: DC
  Administered 2022-06-04 – 2022-06-07 (×7): 1 [drp] via OPHTHALMIC
  Filled 2022-06-03: qty 5

## 2022-06-03 MED ORDER — NICOTINE 21 MG/24HR TD PT24
21.0000 mg | MEDICATED_PATCH | Freq: Every day | TRANSDERMAL | Status: DC
  Administered 2022-06-03 – 2022-06-07 (×5): 21 mg via TRANSDERMAL
  Filled 2022-06-03 (×5): qty 1

## 2022-06-03 MED ORDER — THIAMINE HCL 100 MG/ML IJ SOLN: 100.0000 mg | Freq: Every day | INTRAMUSCULAR | Status: DC

## 2022-06-03 MED ORDER — PANTOPRAZOLE SODIUM 40 MG PO TBEC
40.0000 mg | DELAYED_RELEASE_TABLET | Freq: Every day | ORAL | Status: DC
  Administered 2022-06-03 – 2022-06-07 (×5): 40 mg via ORAL
  Filled 2022-06-03 (×5): qty 1

## 2022-06-03 NOTE — ED Notes (Signed)
ED TO INPATIENT HANDOFF REPORT  ED Nurse Name and Phone #: Stanton Kidney, RN  S Name/Age/Gender Felicia Perkins 46 y.o. female Room/Bed: 032C/032C  Code Status   Code Status: Full Code  Home/SNF/Other Home Patient oriented to: self, place, time, and situation Is this baseline? Yes   Triage Complete: Triage complete  Chief Complaint Hip fracture ([place]) [S72.009A]  Triage Note Pt  here from home with c/o  fall in her bedroom this am / trip and fall , c/o left hip pain    Allergies No Known Allergies  Level of Care/Admitting Diagnosis ED Disposition     ED Disposition  Admit   Condition  --   [facility] Area: [place] [[id number]]  Level of Care: Med-Surg [16]  May admit patient to [facility] or [facility] if equivalent level of care is available:: No  Covid Evaluation: Asymptomatic - no recent exposure (last 10 days) testing not required  Diagnosis: Hip fracture [facility]) [[id number]]  Admitting Physician: Lequita Halt [[id number]]  Attending Physician: Lequita Halt [[id number]]  Certification:: I certify this patient will need inpatient services for at least 2 midnights  Estimated Length of Stay: 3          B Medical/Surgery History Past Medical History:  Diagnosis Date   Abdominal pain, epigastric 12/16/2014   Acute on chronic pancreatitis ([place]) 03/22/2019   Acute pancreatitis    Alcohol abuse    Alcohol dependence ([place]) 12/16/2012   Medical detox successful    Alcohol induced acute pancreatitis 9/39/0300   Alcoholic cirrhosis of liver without ascites ([place]) 10/15/2016   Anxiety    Bipolar 1 disorder ([place])    Cancer ([place])    cervical cancer   Chest pain with moderate risk for cardiac etiology 12/15/2014   Cirrhosis ([place])    COPD (chronic obstructive pulmonary disease) ([place]) 09/04/2016   Coronary artery disease    Depression    Duodenitis 11/22/2017   Eczema 10/09/2018   Elevated liver enzymes 12/22/2014   Gastritis and gastroduodenitis 09/04/2016    GERD (gastroesophageal reflux disease)    Nondisplaced fracture of neck of fifth metacarpal bone, right hand, initial encounter for closed fracture 08/26/2017   Pancreatitis    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 Early 20's. Normal   ENDARTERECTOMY Right 01/26/2021   Procedure: RIGHT CAROTID ARTERY ENDARTERECTOMY;  Surgeon: Waynetta Sandy, MD;  Location: [facility];  Service: Vascular;  Laterality: Right;   ESOPHAGOGASTRODUODENOSCOPY (EGD) WITH PROPOFOL N/A 01/02/2018   Procedure: ESOPHAGOGASTRODUODENOSCOPY (EGD) WITH PROPOFOL;  Surgeon: Milus Banister, MD;  [facility];  Service: Endoscopy;  Laterality: N/A;   EUS N/A 01/02/2018   Procedure: UPPER ENDOSCOPIC ULTRASOUND (EUS) RADIAL;  Surgeon: Milus Banister, MD;  [facility];  Service: Endoscopy;  Laterality: N/A;   FOOT SURGERY     OTHER SURGICAL HISTORY     ovarian surgery   TUBAL LIGATION       A IV Location/Drains/Wounds Patient Lines/Drains/Airways Status     Active Line/Drains/Airways     Name Placement date Placement time Site Days  Peripheral IV 06/03/22 22 G Anterior;Proximal;Right Forearm 06/03/22  1022  Forearm  less than 1   Incision (Closed) 01/26/21 Neck Right 01/26/21  0809  -- 493            Intake/Output Last 24 hours No intake or output data in the 24 hours ending 06/03/22 1916  Labs/Imaging Results for orders placed or performed during the hospital encounter of 06/03/22 (from the past 48 hour(s))  CBC     Status: None   Collection Time: 06/03/22 10:04 AM  Result Value Ref Range   WBC 9.7 4.0 - 10.5 K/uL   RBC  4.15 3.87 - 5.11 MIL/uL   Hemoglobin 13.4 12.0 - 15.0 g/dL   HCT 39.9 36.0 - 46.0 %   MCV 96.1 80.0 - 100.0 fL   MCH 32.3 26.0 - 34.0 pg   MCHC 33.6 30.0 - 36.0 g/dL   RDW 13.1 11.5 - 15.5 %   Platelets 240 150 - 400 K/uL   nRBC 0.0 0.0 - 0.2 %    Comment: Performed at [facility], [place] [address], [place], [place] [address]  Comprehensive metabolic panel     Status: Abnormal   Collection Time: 06/03/22 10:04 AM  Result Value Ref Range   Sodium 134 (L) 135 - 145 mmol/L   Potassium 3.8 3.5 - 5.1 mmol/L   Chloride 98 98 - 111 mmol/L   CO2 20 (L) 22 - 32 mmol/L   Glucose, Bld 106 (H) 70 - 99 mg/dL    Comment: Glucose reference range applies only to samples taken after fasting for at least 8 hours.   BUN <5 (L) 6 - 20 mg/dL   Creatinine, Ser 0.56 0.44 - 1.00 mg/dL   Calcium 9.0 8.9 - 10.3 mg/dL   Total Protein 6.8 6.5 - 8.1 g/dL   Albumin 3.9 3.5 - 5.0 g/dL   AST 28 15 - 41 U/L   ALT 20 0 - 44 U/L   Alkaline Phosphatase 92 38 - 126 U/L   Total Bilirubin 0.5 0.3 - 1.2 mg/dL   GFR, Estimated >60 >60 mL/min    Comment: (NOTE) Calculated using the CKD-EPI Creatinine Equation (2021)    Anion gap 16 (H) 5 - 15    Comment: Performed at [facility] [address], [place], [place] [address]  Type and screen [place]     Status: None   Collection Time: 06/03/22 10:18 AM  Result Value Ref Range   ABO/RH(D) B POS    Antibody Screen NEG    Sample Expiration      06/06/2022,2359 Performed at [facility], [place] [address] [place], [place] [address]   I-Stat beta hCG blood, ED     Status: None   Collection Time: 06/03/22 10:35 AM  Result Value Ref Range   I-stat hCG, quantitative <5.0 <5 mIU/mL   Comment 3            Comment:   GEST. AGE      CONC.  (mIU/mL)   <=1 WEEK        5 - 50     2 WEEKS       50 - 500     3 WEEKS       100 - 10,000     4 WEEKS     1,000 - 30,000        FEMALE AND NON-PREGNANT FEMALE:     LESS THAN 5 mIU/mL  CT Hip  Left Wo Contrast  Result Date: 06/03/2022 CLINICAL DATA:  Left hip fracture after fall EXAM: CT OF THE LEFT HIP WITHOUT CONTRAST TECHNIQUE: Multidetector CT imaging of the left hip was performed according to the standard protocol. Multiplanar CT image reconstructions were also generated. RADIATION DOSE REDUCTION: This exam was performed according to the departmental dose-optimization program which includes automated exposure control, adjustment of the mA and/or kV according to patient size and/or use of iterative reconstruction technique. COMPARISON:  Same-day x-ray, CT 10/24/2020 FINDINGS: Bones/Joint/Cartilage Acute subcapital fracture of the left femoral neck with minimal impaction. No significant displacement or angulation. No fracture involvement of the femoral head or intertrochanteric region. Hip joint alignment is maintained without dislocation. Intact bony pelvis without fracture or diastasis. Small sclerotic lesion within the posterior left ilium is stable from prior CT (series 5, image 18), likely benign fibro-osseous lesion. No suspicious lytic or sclerotic bone lesion. Bilateral hip joint spaces are preserved. Moderate-sized left hip joint lipohemarthrosis. Ligaments Suboptimally assessed by CT. Muscles and Tendons No acute musculotendinous findings by CT. Soft tissues Negative for hematoma within the soft tissues. No inguinal or intrapelvic lymphadenopathy. Atherosclerotic vascular calcifications are present. IMPRESSION: 1. Acute subcapital fracture of the left femoral neck with minimal impaction. 2. Moderate-sized left hip joint lipohemarthrosis. Electronically Signed   By: Davina Poke D.O.   On: 06/03/2022 13:01   CT Head Wo Contrast  Result Date: 06/03/2022 CLINICAL DATA:  Head trauma, moderate to severe. EXAM: CT HEAD WITHOUT CONTRAST TECHNIQUE: Contiguous axial images were obtained from the base of the skull through the vertex without intravenous contrast. RADIATION DOSE REDUCTION:  This exam was performed according to the departmental dose-optimization program which includes automated exposure control, adjustment of the mA and/or kV according to patient size and/or use of iterative reconstruction technique. COMPARISON:  None Available. FINDINGS: Brain: No hydrocephalus. No mass, hemorrhage, edema or other evidence of acute parenchymal abnormality. No extra-axial hemorrhage. Vascular: No hyperdense vessel or unexpected calcification. Skull: Normal. Negative for fracture or focal lesion. Sinuses/Orbits: No acute finding. Other: None. IMPRESSION: Negative head CT. No intracranial hemorrhage or skull fracture. Electronically Signed   By: Franki Cabot M.D.   On: 06/03/2022 12:51   DG Chest 1 View  Result Date: 06/03/2022 CLINICAL DATA:  Preop chest x-ray. EXAM: CHEST  1 VIEW COMPARISON:  08/15/2020 FINDINGS: Normal heart size and mediastinal contours. No acute infiltrate or edema. No effusion or pneumothorax. No acute osseous findings. IMPRESSION: Negative portable chest. Electronically Signed   By: Jorje Guild M.D.   On: 06/03/2022 09:55   DG Hip Unilat W or Wo Pelvis 2-3 Views Left  Result Date: 06/03/2022 CLINICAL DATA:  Fall with pain EXAM: DG HIP (WITH OR WITHOUT PELVIS) 3V LEFT COMPARISON:  None Available. FINDINGS: Left femoral neck fracture with mild impaction. No dislocation. No degenerative change. Subjective osteopenia. IMPRESSION: Subcapital left femoral neck fracture with mild impaction. Electronically Signed   By: Jorje Guild M.D.   On: 06/03/2022 09:52    Pending Labs Unresulted Labs (From admission, onward)     Start     Ordered   06/03/22 1409  HIV Antibody (routine testing w rflx)  (HIV Antibody (Routine testing w reflex) panel)  Once,   R        06/03/22 1410   06/03/22 1403  VITAMIN D 25 Hydroxy (Vit-D Deficiency, Fractures)  Add-on,   AD        06/03/22 1403  Vitals/Pain Today's Vitals   06/03/22 1123 06/03/22 1146 06/03/22 1740  06/03/22 1819  BP: (!) 127/90   (!) 155/85  Pulse: 78   81  Resp: 14   17  Temp: 99.2 F (37.3 C)   98.6 F (37 C)  TempSrc:    Oral  SpO2: 99%   100%  Weight:      Height:      PainSc:  10-Worst pain ever 4      Isolation Precautions No active isolations  Medications Medications  feeding supplement (ENSURE ENLIVE / ENSURE PLUS) liquid 237 mL (237 mLs Oral Not Given 06/03/22 1732)  amLODipine (NORVASC) tablet 2.5 mg (2.5 mg Oral Given 06/03/22 1827)  rosuvastatin (CRESTOR) tablet 10 mg (has no administration in time range)  hydrOXYzine (ATARAX) tablet 25 mg (has no administration in time range)  famotidine (PEPCID) tablet 20 mg (20 mg Oral Not Given 06/03/22 1833)  pantoprazole (PROTONIX) EC tablet 40 mg (40 mg Oral Given 06/03/22 1827)  aspirin EC tablet 81 mg (81 mg Oral Given 06/03/22 1827)  gabapentin (NEURONTIN) capsule 300 mg (300 mg Oral Given 06/03/22 1826)  albuterol (PROVENTIL) (2.5 MG/3ML) 0.083% nebulizer solution 3 mL (has no administration in time range)  loratadine (CLARITIN) tablet 10 mg (10 mg Oral Given 06/03/22 1827)  fluticasone (FLONASE) 50 MCG/ACT nasal spray 2 spray (2 sprays Each Nare Not Given 06/03/22 1733)  mometasone-formoterol (DULERA) 200-5 MCG/ACT inhaler 2 puff (has no administration in time range)  olopatadine (PATANOL) 0.1 % ophthalmic solution 1 drop (1 drop Both Eyes Not Given 06/03/22 1734)  HYDROcodone-acetaminophen (NORCO/VICODIN) 5-325 MG per tablet 1-2 tablet (2 tablets Oral Given 06/03/22 1832)  morphine (PF) 2 MG/ML injection 0.5 mg (0.5 mg Intravenous Given 06/03/22 1720)  LORazepam (ATIVAN) tablet 1-4 mg (has no administration in time range)    Or  LORazepam (ATIVAN) injection 1-4 mg (has no administration in time range)  thiamine (VITAMIN B1) tablet 100 mg (100 mg Oral Given 06/03/22 1835)    Or  thiamine (VITAMIN B1) injection 100 mg ( Intravenous See Alternative 54/62/70 3500)  folic acid (FOLVITE) tablet 1 mg (1 mg Oral Given  06/03/22 1827)  multivitamin with minerals tablet 1 tablet (1 tablet Oral Given 06/03/22 1827)  nicotine (NICODERM CQ - dosed in mg/24 hours) patch 21 mg (21 mg Transdermal Patch Applied 06/03/22 1827)  cyclobenzaprine (FLEXERIL) tablet 5 mg (5 mg Oral Given 06/03/22 1828)  morphine (PF) 4 MG/ML injection 4 mg (4 mg Intravenous Given 06/03/22 1022)  morphine (PF) 4 MG/ML injection 4 mg (4 mg Intravenous Given 06/03/22 1217)  ondansetron (ZOFRAN) injection 4 mg (4 mg Intravenous Given 06/03/22 1217)  HYDROmorphone (DILAUDID) injection 0.5 mg (0.5 mg Intravenous Given 06/03/22 1500)    Mobility non-ambulatory     Focused Assessments Neuro Assessment Handoff:  Swallow screen pass? Yes          Neuro Assessment:   Neuro Checks:      Last Documented NIHSS Modified Score:   Has TPA been given? No If patient is a Neuro Trauma and patient is going to OR before floor call report to [facility] nurse: [phone] or [phone]   R Recommendations: See Admitting Provider Note  Report given to:   Additional Notes: pt is AAOx4. Pt is on RA. Pt has on purewick.

## 2022-06-03 NOTE — ED Provider Notes (Signed)
[facility] Provider Note   CSN: [id number] Arrival date & time: 06/03/22  3716     History  Chief Complaint  Patient presents with   [facility] is a 46 y.o. female.  With past medical history of alcohol dependence complicated by pancreatitis and cirrhosis, tobacco use, GERD, CAD, COPD who presents to the emergency [facility] with fall.  States that yesterday evening around 10p she was running into the house. States she rounded the corner into her bedroom when her left foot stepped on a dog bed, slipped out from under her and she fell onto her left side. She states she landed on her left hip and also struck her head. She denies loss of consciousness. States since then she has had pain to the left hip and groin. States the pain is moderate to severe and radiates down into the thigh. She denies numbness or tingling to her leg. Anticoagulated on Plavix.    Fall       Home Medications Prior to Admission medications   Medication Sig Start Date End Date Taking? Authorizing Provider  albuterol (VENTOLIN HFA) 108 (90 Base) MCG/ACT inhaler Inhale 1-2 puffs into the lungs every 6 (six) hours as needed for wheezing or shortness of breath. 11/01/21   Charlott Rakes, MD  amLODipine (NORVASC) 2.5 MG tablet Take 1 tablet (2.5 mg total) by mouth daily. 03/15/21 03/13/22  Tobb, Kardie, DO  cetirizine (ZYRTEC) 10 MG tablet Take 1 tablet (10 mg total) by mouth daily. 11/01/21   Charlott Rakes, MD  clopidogrel (PLAVIX) 75 MG tablet TAKE 1 TABLET BY MOUTH EVERY DAY 11/23/21   Angelia Mould, MD  famotidine (PEPCID) 20 MG tablet Take 1 tablet (20 mg total) by mouth 2 (two) times daily. Patient taking differently: Take 20 mg by mouth daily. 08/24/20   Argentina Donovan, PA-C  fluticasone (FLONASE) 50 MCG/ACT nasal spray Place 2 sprays into both nostrils daily. 11/01/21   Charlott Rakes, MD  gabapentin (NEURONTIN) 300 MG capsule Take 1 capsule (300  mg total) by mouth 3 (three) times daily for 7 days. 12/29/21 03/13/22  McDonald, Stephan Minister, DPM  hydrOXYzine (ATARAX/VISTARIL) 25 MG tablet Take 25 mg by mouth daily as needed for itching. 12/09/20   [provider]  lidocaine (XYLOCAINE) 2 % solution Use as directed 15 mLs in the mouth or throat every 6 (six) hours as needed for mouth pain. 03/13/22   Carvel Getting, NP  mometasone-formoterol (DULERA) 200-5 MCG/ACT AERO Inhale 2 puffs into the lungs daily as needed. 11/01/21   Charlott Rakes, MD  olopatadine (PATANOL) 0.1 % ophthalmic solution Place 1 drop into both eyes 2 (two) times daily. 11/01/21   Charlott Rakes, MD  pantoprazole (PROTONIX) 40 MG tablet Take 1 tablet (40 mg total) by mouth daily. 05/05/22   Charlott Rakes, MD  rosuvastatin (CRESTOR) 10 MG tablet TAKE 1 TABLET BY MOUTH EVERY DAY 11/21/21   Angelia Mould, MD  cloNIDine (CATAPRES) 0.1 MG tablet Take 1 tablet (0.1 mg total) by mouth at bedtime. For hot flashes Patient not taking: Reported on 10/17/2019 01/13/19 08/15/20  Charlott Rakes, MD      Allergies    Patient has no known allergies.    Review of Systems   Review of Systems  Musculoskeletal:  Positive for arthralgias, gait problem and joint swelling.  All other systems reviewed and are negative.   Physical Exam Updated Vital Signs BP (!) 127/90 (BP Location: Left  Arm)   Pulse 78   Temp 99.2 F (37.3 C)   Resp 14   Ht '5\' 4"'$  (1.626 m)   Wt 48.5 kg   SpO2 99%   BMI 18.37 kg/m  Physical Exam Vitals and nursing note reviewed.  Constitutional:      General: She is not in acute distress.    Appearance: Normal appearance. She is normal weight. She is not toxic-appearing.  HENT:     Head: Normocephalic and atraumatic.     Mouth/Throat:     Pharynx: Oropharynx is clear.  Eyes:     General: No scleral icterus.    Extraocular Movements: Extraocular movements intact.     Pupils: Pupils are equal, round, and reactive to light.  Cardiovascular:     Rate and  Rhythm: Regular rhythm. Tachycardia present.     Pulses: Normal pulses.     Heart sounds: No murmur heard. Pulmonary:     Effort: Pulmonary effort is normal. No respiratory distress.  Abdominal:     Palpations: Abdomen is soft.  Musculoskeletal:        General: Tenderness and signs of injury present. No deformity.     Cervical back: Normal range of motion and neck supple. No tenderness.     Left hip: Tenderness and bony tenderness present. Decreased range of motion. Decreased strength.       Legs:     Comments: Left hip with mild swelling, no bruising or erythema. No leg shortening or rotation. Able to move feet. DP pulses 2+. Sensation intact. Decreased ROM at left leg 2/2 pain.   Skin:    General: Skin is warm and dry.     Capillary Refill: Capillary refill takes less than 2 seconds.     Findings: No bruising or rash.  Neurological:     General: No focal deficit present.     Mental Status: She is alert and oriented to person, place, and time. Mental status is at baseline.     Cranial Nerves: No cranial nerve deficit.  Psychiatric:        Mood and Affect: Mood normal.        Behavior: Behavior normal.        Thought Content: Thought content normal.        Judgment: Judgment normal.     ED Results / Procedures / Treatments   Labs (all labs ordered are listed, but only abnormal results are displayed) Labs Reviewed  COMPREHENSIVE METABOLIC PANEL - Abnormal; Notable for the following components:      Result Value   Sodium 134 (*)    CO2 20 (*)    Glucose, Bld 106 (*)    BUN <5 (*)    Anion gap 16 (*)    All other components within normal limits  CBC  I-STAT BETA HCG BLOOD, ED ([facility], [facility], [facility] ONLY)  TYPE AND SCREEN    EKG None  Radiology CT Hip Left Wo Contrast  Result Date: 06/03/2022 CLINICAL DATA:  Left hip fracture after fall EXAM: CT OF THE LEFT HIP WITHOUT CONTRAST TECHNIQUE: Multidetector CT imaging of the left hip was performed according to the standard protocol.  Multiplanar CT image reconstructions were also generated. RADIATION DOSE REDUCTION: This exam was performed according to the departmental dose-optimization program which includes automated exposure control, adjustment of the mA and/or kV according to patient size and/or use of iterative reconstruction technique. COMPARISON:  Same-day x-ray, CT 10/24/2020 FINDINGS: Bones/Joint/Cartilage Acute subcapital fracture of the left femoral neck with  minimal impaction. No significant displacement or angulation. No fracture involvement of the femoral head or intertrochanteric region. Hip joint alignment is maintained without dislocation. Intact bony pelvis without fracture or diastasis. Small sclerotic lesion within the posterior left ilium is stable from prior CT (series 5, image 18), likely benign fibro-osseous lesion. No suspicious lytic or sclerotic bone lesion. Bilateral hip joint spaces are preserved. Moderate-sized left hip joint lipohemarthrosis. Ligaments Suboptimally assessed by CT. Muscles and Tendons No acute musculotendinous findings by CT. Soft tissues Negative for hematoma within the soft tissues. No inguinal or intrapelvic lymphadenopathy. Atherosclerotic vascular calcifications are present. IMPRESSION: 1. Acute subcapital fracture of the left femoral neck with minimal impaction. 2. Moderate-sized left hip joint lipohemarthrosis. Electronically Signed   By: Davina Poke D.O.   On: 06/03/2022 13:01   CT Head Wo Contrast  Result Date: 06/03/2022 CLINICAL DATA:  Head trauma, moderate to severe. EXAM: CT HEAD WITHOUT CONTRAST TECHNIQUE: Contiguous axial images were obtained from the base of the skull through the vertex without intravenous contrast. RADIATION DOSE REDUCTION: This exam was performed according to the departmental dose-optimization program which includes automated exposure control, adjustment of the mA and/or kV according to patient size and/or use of iterative reconstruction technique.  COMPARISON:  None Available. FINDINGS: Brain: No hydrocephalus. No mass, hemorrhage, edema or other evidence of acute parenchymal abnormality. No extra-axial hemorrhage. Vascular: No hyperdense vessel or unexpected calcification. Skull: Normal. Negative for fracture or focal lesion. Sinuses/Orbits: No acute finding. Other: None. IMPRESSION: Negative head CT. No intracranial hemorrhage or skull fracture. Electronically Signed   By: Franki Cabot M.D.   On: 06/03/2022 12:51   DG Chest 1 View  Result Date: 06/03/2022 CLINICAL DATA:  Preop chest x-ray. EXAM: CHEST  1 VIEW COMPARISON:  08/15/2020 FINDINGS: Normal heart size and mediastinal contours. No acute infiltrate or edema. No effusion or pneumothorax. No acute osseous findings. IMPRESSION: Negative portable chest. Electronically Signed   By: Jorje Guild M.D.   On: 06/03/2022 09:55   DG Hip Unilat W or Wo Pelvis 2-3 Views Left  Result Date: 06/03/2022 CLINICAL DATA:  Fall with pain EXAM: DG HIP (WITH OR WITHOUT PELVIS) 3V LEFT COMPARISON:  None Available. FINDINGS: Left femoral neck fracture with mild impaction. No dislocation. No degenerative change. Subjective osteopenia. IMPRESSION: Subcapital left femoral neck fracture with mild impaction. Electronically Signed   By: Jorje Guild M.D.   On: 06/03/2022 09:52    Procedures Procedures   Medications Ordered in ED Medications  morphine (PF) 4 MG/ML injection 4 mg (4 mg Intravenous Given 06/03/22 1022)  morphine (PF) 4 MG/ML injection 4 mg (4 mg Intravenous Given 06/03/22 1217)  ondansetron (ZOFRAN) injection 4 mg (4 mg Intravenous Given 06/03/22 1217)    ED Course/ Medical Decision Making/ A&P Clinical Course as of 06/03/22 1324  Sun Jun 03, 2022  1218 Consulted with Alvan Dame, MD orthopedic surgery. Requests CT Left hip. Hold Plavix. Will have surgery Monday or Tuesday. Medicine admit [LA]    Clinical Course User Index [LA] Mickie Hillier, PA-C                           Medical  Decision Making Amount and/or Complexity of Data Reviewed Labs: ordered. Radiology: ordered.  Risk Prescription drug management. Decision regarding hospitalization.  This patient presents to the ED with chief complaint(s) of fall with pertinent past medical history of alcohol use disorder which further complicates the presenting complaint. The complaint involves an  extensive differential diagnosis and also carries with it a high risk of complications and morbidity.    The differential diagnosis includes fracture, subluxation, MSK injury   Additional history obtained: Additional history obtained from  none available Records reviewed previous admission documents, Care Everywhere/External Records, and Primary Care Documents  ED Course and Reassessment: 46 year old female who presents to the emergency [facility] with fall.  TTP of the left hip.  Imaging shows + femoral neck fx. No history of osteoporosis, however alcohol use disorder so likely has soft bones. Triage also ordered labs including t/s. She does have AG. ?etoh given chronic use.  I added on a CT head given that she is anticoagulated on plavix and endorses striking head. This was normal.  Consulted and spoke with Dr. Alvan Dame, ortho who requests medicine admission. Requests holding plavix, CT left hip. This was ordered. Dr. Roosevelt Locks, hospitalist agrees to admit patient. I relayed to him that patient can eat for now, holding plavix.   Patient is agreeable to plan. Will pain control until admitted.   Independent labs interpretation:  The following labs were independently interpreted: cbc nl, not pregnant, cmp with AG 16 (?etoh). T/s completed.   Independent visualization of imaging: - I independently visualized the following imaging with scope of interpretation limited to determining acute life threatening conditions related to emergency care: XR hip with +femoral neck fx and impaction. CT hip show same. CT head negative for bleed.    Consultation: - Consulted or discussed management/test interpretation w/ external professional: Orthopedic surgery Dr. Alvan Dame who requests medicine admission. Will operate Monday or Tuesday. Requesting to hold plavix. Can eat for now.   Consideration for admission or further workup: admit for orthopedic surgery  Social Determinants of health: alcohol abuse Final Clinical Impression(s) / ED Diagnoses Final diagnoses:  Closed fracture of left hip, initial encounter [facility])    Rx / DC Orders ED Discharge Orders     None         Mickie Hillier, PA-C 06/03/22 1328    Lajean Saver, MD 06/03/22 1407

## 2022-06-03 NOTE — H&P (Signed)
History and Physical    Felicia Perkins MEQ:[id number] DOB: 1976/03/08 DOA: 06/03/2022  PCP: Charlott Rakes, MD (Confirm with patient/family/NH records and if not entered, this has to be entered at [facility] point of entry) Patient coming from: Home  I have personally briefly reviewed patient's old medical records in [place]  Chief Complaint: I fell and hip my hip  HPI: Felicia Perkins is a 46 y.o. female with medical history significant of high-grade right carotid stenosis status post enterectomy on Plavix, HTN, COPD, alcohol abuse, anxiety/depression, presented with fall and left hip pain.  Patient tripped on her dog house and fell on left hip, no LOC, no head injury.  She was able to stand up and walk however, soon she started to feel 10/10 sharp pain of left hip and retired to bed.  Denies any shortness of breath chest pain, she does been drinking occasionally, last drink was last night.  ED Course: Vital signs stable, afebrile, nonhypotensive no tachycardia no hypoxia.  Left hip CT showed acute subcapital fracture of left femoral neck with minimal impact.  Review of Systems: As per HPI otherwise 14 point review of systems negative.    Past Medical History:  Diagnosis Date   Abdominal pain, epigastric 12/16/2014   Acute on chronic pancreatitis ([place]) 03/22/2019   Acute pancreatitis    Alcohol abuse    Alcohol dependence ([place]) 12/16/2012   Medical detox successful    Alcohol induced acute pancreatitis 2/97/9892   Alcoholic cirrhosis of liver without ascites ([place]) 10/15/2016   Anxiety    Bipolar 1 disorder (HCC)    Cancer ([place])    cervical cancer   Chest pain with moderate risk for cardiac etiology 12/15/2014   Cirrhosis ([place])    COPD (chronic obstructive pulmonary disease) ([place]) 09/04/2016   Coronary artery disease    Depression    Duodenitis 11/22/2017   Eczema 10/09/2018   Elevated liver enzymes 12/22/2014   Gastritis and gastroduodenitis 09/04/2016   GERD (gastroesophageal  reflux disease)    Nondisplaced fracture of neck of fifth metacarpal bone, right hand, initial encounter for closed fracture 08/26/2017   Pancreatitis    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   PTSD (post-traumatic stress disorder)    Tobacco abuse 09/04/2016   Tobacco abuse counseling    Vascular disease     Past Surgical History:  Procedure Laterality Date   ABDOMINAL HYSTERECTOMY  2004   BIOPSY  01/02/2018   Procedure: BIOPSY;  Surgeon: Milus Banister, MD;  Location: [facility];  Service: Endoscopy;;   COLONOSCOPY     Done in [place] she thinks. Early 20's. Normal   ENDARTERECTOMY Right 01/26/2021   Procedure: RIGHT CAROTID ARTERY ENDARTERECTOMY;  Surgeon: Waynetta Sandy, MD;  Location: [facility];  Service: Vascular;  Laterality: Right;   ESOPHAGOGASTRODUODENOSCOPY (EGD) WITH PROPOFOL N/A 01/02/2018   Procedure: ESOPHAGOGASTRODUODENOSCOPY (EGD) WITH PROPOFOL;  Surgeon: Milus Banister, MD;  Location: [facility];  Service: Endoscopy;  Laterality: N/A;   EUS N/A 01/02/2018   Procedure: UPPER ENDOSCOPIC ULTRASOUND (EUS) RADIAL;  Surgeon: Milus Banister, MD;  Location: [facility];  Service: Endoscopy;  Laterality: N/A;   FOOT SURGERY     OTHER SURGICAL HISTORY     ovarian surgery   TUBAL LIGATION       reports that she has been smoking cigarettes. She has been smoking an average of .5 packs per day. She has never used smokeless tobacco. She reports current alcohol use. She reports that she does not use  drugs.  No Known Allergies  Family History  Problem Relation Age of Onset   Hypertension Mother    Breast cancer Mother 16   Pancreatitis Mother    Hypertension Father    Heart attack Cousin 4       died in 66s of MI   Breast cancer Maternal Aunt    Breast cancer Maternal Aunt    Colon cancer Neg Hx    Esophageal cancer Neg Hx    Rectal cancer Neg Hx    Stomach cancer Neg Hx      Prior to Admission medications   Medication Sig Start Date End Date Taking?  Authorizing Provider  albuterol (VENTOLIN HFA) 108 (90 Base) MCG/ACT inhaler Inhale 1-2 puffs into the lungs every 6 (six) hours as needed for wheezing or shortness of breath. 11/01/21   Charlott Rakes, MD  amLODipine (NORVASC) 2.5 MG tablet Take 1 tablet (2.5 mg total) by mouth daily. 03/15/21 03/13/22  Tobb, Kardie, DO  cetirizine (ZYRTEC) 10 MG tablet Take 1 tablet (10 mg total) by mouth daily. 11/01/21   Charlott Rakes, MD  clopidogrel (PLAVIX) 75 MG tablet TAKE 1 TABLET BY MOUTH EVERY DAY 11/23/21   Angelia Mould, MD  famotidine (PEPCID) 20 MG tablet Take 1 tablet (20 mg total) by mouth 2 (two) times daily. Patient taking differently: Take 20 mg by mouth daily. 08/24/20   Argentina Donovan, PA-C  fluticasone (FLONASE) 50 MCG/ACT nasal spray Place 2 sprays into both nostrils daily. 11/01/21   Charlott Rakes, MD  gabapentin (NEURONTIN) 300 MG capsule Take 1 capsule (300 mg total) by mouth 3 (three) times daily for 7 days. 12/29/21 03/13/22  McDonald, Stephan Minister, DPM  hydrOXYzine (ATARAX/VISTARIL) 25 MG tablet Take 25 mg by mouth daily as needed for itching. 12/09/20   [provider]  lidocaine (XYLOCAINE) 2 % solution Use as directed 15 mLs in the mouth or throat every 6 (six) hours as needed for mouth pain. 03/13/22   Carvel Getting, NP  mometasone-formoterol (DULERA) 200-5 MCG/ACT AERO Inhale 2 puffs into the lungs daily as needed. 11/01/21   Charlott Rakes, MD  olopatadine (PATANOL) 0.1 % ophthalmic solution Place 1 drop into both eyes 2 (two) times daily. 11/01/21   Charlott Rakes, MD  pantoprazole (PROTONIX) 40 MG tablet Take 1 tablet (40 mg total) by mouth daily. 05/05/22   Charlott Rakes, MD  rosuvastatin (CRESTOR) 10 MG tablet TAKE 1 TABLET BY MOUTH EVERY DAY 11/21/21   Angelia Mould, MD  cloNIDine (CATAPRES) 0.1 MG tablet Take 1 tablet (0.1 mg total) by mouth at bedtime. For hot flashes Patient not taking: Reported on 10/17/2019 01/13/19 08/15/20  Charlott Rakes, MD    Physical  Exam: Vitals:   06/03/22 0904 06/03/22 0906 06/03/22 1123  BP: (!) 148/77  (!) 127/90  Pulse: 69  78  Resp: 18  14  Temp: 98.4 F (36.9 C)  99.2 F (37.3 C)  TempSrc: Oral    SpO2: 99%  99%  Weight:  48.5 kg   Height:  '5\' 4"'$  (1.626 m)     Constitutional: NAD, calm, comfortable Vitals:   06/03/22 0904 06/03/22 0906 06/03/22 1123  BP: (!) 148/77  (!) 127/90  Pulse: 69  78  Resp: 18  14  Temp: 98.4 F (36.9 C)  99.2 F (37.3 C)  TempSrc: Oral    SpO2: 99%  99%  Weight:  48.5 kg   Height:  '5\' 4"'$  (1.626 m)    Eyes: PERRL,  lids and conjunctivae normal ENMT: Mucous membranes are moist. Posterior pharynx clear of any exudate or lesions.Normal dentition.  Neck: normal, supple, no masses, no thyromegaly Respiratory: clear to auscultation bilaterally, no wheezing, no crackles. Normal respiratory effort. No accessory muscle use.  Cardiovascular: Regular rate and rhythm, no murmurs / rubs / gallops. No extremity edema. 2+ pedal pulses. No carotid bruits.  Abdomen: no tenderness, no masses palpated. No hepatosplenomegaly. Bowel sounds positive.  Musculoskeletal: no clubbing / cyanosis. No joint deformity upper and lower extremities. Good ROM, no contractures. Normal muscle tone.  Severe left hip tenderness Skin: no rashes, lesions, ulcers. No induration Neurologic: CN 2-12 grossly intact. Sensation intact, DTR normal. Strength 5/5 in all 4.  Psychiatric: Normal judgment and insight. Alert and oriented x 3. Normal mood.     Labs on Admission: I have personally reviewed following labs and imaging studies  CBC: Recent Labs  Lab 06/03/22 1004  WBC 9.7  HGB 13.4  HCT 39.9  MCV 96.1  PLT 831   Basic Metabolic Panel: Recent Labs  Lab 06/03/22 1004  NA 134*  K 3.8  CL 98  CO2 20*  GLUCOSE 106*  BUN <5*  CREATININE 0.56  CALCIUM 9.0   GFR: Estimated Creatinine Clearance: 66.6 mL/min (by C-G formula based on SCr of 0.56 mg/dL). Liver Function Tests: Recent Labs  Lab  06/03/22 1004  AST 28  ALT 20  ALKPHOS 92  BILITOT 0.5  PROT 6.8  ALBUMIN 3.9   No results for input(s): "LIPASE", "AMYLASE" in the last 168 hours. No results for input(s): "AMMONIA" in the last 168 hours. Coagulation Profile: No results for input(s): "INR", "PROTIME" in the last 168 hours. Cardiac Enzymes: No results for input(s): "CKTOTAL", "CKMB", "CKMBINDEX", "TROPONINI" in the last 168 hours. BNP (last 3 results) No results for input(s): "PROBNP" in the last 8760 hours. HbA1C: No results for input(s): "HGBA1C" in the last 72 hours. CBG: No results for input(s): "GLUCAP" in the last 168 hours. Lipid Profile: No results for input(s): "CHOL", "HDL", "LDLCALC", "TRIG", "CHOLHDL", "LDLDIRECT" in the last 72 hours. Thyroid Function Tests: No results for input(s): "TSH", "T4TOTAL", "FREET4", "T3FREE", "THYROIDAB" in the last 72 hours. Anemia Panel: No results for input(s): "VITAMINB12", "FOLATE", "FERRITIN", "TIBC", "IRON", "RETICCTPCT" in the last 72 hours. Urine analysis:    Component Value Date/Time   COLORURINE YELLOW 05/04/2022 [place] 05/04/2022 1352   LABSPEC 1.010 05/04/2022 1352   PHURINE 7.0 05/04/2022 1352   GLUCOSEU NEGATIVE 05/04/2022 1352   HGBUR NEGATIVE 05/04/2022 1352   BILIRUBINUR NEGATIVE 05/04/2022 1352   KETONESUR NEGATIVE 05/04/2022 1352   PROTEINUR NEGATIVE 05/04/2022 1352   UROBILINOGEN 1.0 09/14/2016 1531   NITRITE NEGATIVE 05/04/2022 1352   LEUKOCYTESUR NEGATIVE 05/04/2022 1352    Radiological Exams on Admission: CT Hip Left Wo Contrast  Result Date: 06/03/2022 CLINICAL DATA:  Left hip fracture after fall EXAM: CT OF THE LEFT HIP WITHOUT CONTRAST TECHNIQUE: Multidetector CT imaging of the left hip was performed according to the standard protocol. Multiplanar CT image reconstructions were also generated. RADIATION DOSE REDUCTION: This exam was performed according to the departmental dose-optimization program which includes  automated exposure control, adjustment of the mA and/or kV according to patient size and/or use of iterative reconstruction technique. COMPARISON:  Same-day x-ray, CT 10/24/2020 FINDINGS: Bones/Joint/Cartilage Acute subcapital fracture of the left femoral neck with minimal impaction. No significant displacement or angulation. No fracture involvement of the femoral head or intertrochanteric region. Hip joint alignment is maintained without  dislocation. Intact bony pelvis without fracture or diastasis. Small sclerotic lesion within the posterior left ilium is stable from prior CT (series 5, image 18), likely benign fibro-osseous lesion. No suspicious lytic or sclerotic bone lesion. Bilateral hip joint spaces are preserved. Moderate-sized left hip joint lipohemarthrosis. Ligaments Suboptimally assessed by CT. Muscles and Tendons No acute musculotendinous findings by CT. Soft tissues Negative for hematoma within the soft tissues. No inguinal or intrapelvic lymphadenopathy. Atherosclerotic vascular calcifications are present. IMPRESSION: 1. Acute subcapital fracture of the left femoral neck with minimal impaction. 2. Moderate-sized left hip joint lipohemarthrosis. Electronically Signed   By: Davina Poke D.O.   On: 06/03/2022 13:01   CT Head Wo Contrast  Result Date: 06/03/2022 CLINICAL DATA:  Head trauma, moderate to severe. EXAM: CT HEAD WITHOUT CONTRAST TECHNIQUE: Contiguous axial images were obtained from the base of the skull through the vertex without intravenous contrast. RADIATION DOSE REDUCTION: This exam was performed according to the departmental dose-optimization program which includes automated exposure control, adjustment of the mA and/or kV according to patient size and/or use of iterative reconstruction technique. COMPARISON:  None Available. FINDINGS: Brain: No hydrocephalus. No mass, hemorrhage, edema or other evidence of acute parenchymal abnormality. No extra-axial hemorrhage. Vascular: No  hyperdense vessel or unexpected calcification. Skull: Normal. Negative for fracture or focal lesion. Sinuses/Orbits: No acute finding. Other: None. IMPRESSION: Negative head CT. No intracranial hemorrhage or skull fracture. Electronically Signed   By: Franki Cabot M.D.   On: 06/03/2022 12:51   DG Chest 1 View  Result Date: 06/03/2022 CLINICAL DATA:  Preop chest x-ray. EXAM: CHEST  1 VIEW COMPARISON:  08/15/2020 FINDINGS: Normal heart size and mediastinal contours. No acute infiltrate or edema. No effusion or pneumothorax. No acute osseous findings. IMPRESSION: Negative portable chest. Electronically Signed   By: Jorje Guild M.D.   On: 06/03/2022 09:55   DG Hip Unilat W or Wo Pelvis 2-3 Views Left  Result Date: 06/03/2022 CLINICAL DATA:  Fall with pain EXAM: DG HIP (WITH OR WITHOUT PELVIS) 3V LEFT COMPARISON:  None Available. FINDINGS: Left femoral neck fracture with mild impaction. No dislocation. No degenerative change. Subjective osteopenia. IMPRESSION: Subcapital left femoral neck fracture with mild impaction. Electronically Signed   By: Jorje Guild M.D.   On: 06/03/2022 09:52    EKG: Independently reviewed.  Sinus, chronic T wave inversions V3-6  Assessment/Plan Principal Problem:   Hip fracture (HCC)  (please populate well all problems here in Problem List. (For example, if patient is on BP meds at home and you resume or decide to hold them, it is a problem that needs to be her. Same for CAD, COPD, HLD and so on)  Left femoral neck fracture -Secondary to mechanical fall -ORIF in a.m.  As per requested by orthopedic surgery, will hold off And no chemical DVT prophylaxis for now. -Consult anesthesiology for local nerve block. -Severe underweight BMI= 18, will check vitamin D3 level, consult nutrition and start patient on protein supplement, risk of delayed healing with nutrition status. -History of high-grade right-sided carotid stenosis, no history of CAD.  Normal coronary artery  CTA May last year with calcium score= 0.  Medically cleared for ORIF with generalized anesthesia with acceptable risk.  COPD -No symptoms or signs of acute exacerbation, continue current maintenance breathing medications.  History of high-grade right-sided carotid stenosis status post endarterectomy -Plavix on hold  Alcohol abuse -No symptoms signs of active withdrawal, start CIWA with as needed benzos.  Cigarette smoke -Cessation education performed at  bedside  Moderate protein calorie malnutrition -Start protein supplement, consult nutrition  Bereavement -Daughter died 7 days ago, but patient denied any excessive negative feeling at this point.  DVT prophylaxis: SCD Code Status: Full code Family Communication: None present Disposition Plan: Patient sick with hip fracture requiring ORIF, explained with intermittent hospital stay Consults called: Orthopedic surgery Admission status: MedSurg admission   Lequita Halt MD [facility] Pager [phone]  06/03/2022, 2:13 PM

## 2022-06-03 NOTE — ED Triage Notes (Signed)
Pt  here from home with c/o  fall in her bedroom this am / trip and fall , c/o left hip pain

## 2022-06-03 NOTE — Progress Notes (Signed)
Patient ID: Felicia Perkins, female   DOB: 01-May-1976, 46 y.o.   MRN: [id number]  Consulted for left femoral neck fracture Full note will follow  Having ER get CT scan of left hip to consider for percutaneous screw fixation vs arthroplasty  Hold Plavix and all chemoprophylaxis Depending on CT scan and Plavix will plan for OR Monday pm or Tuesday  NWB LLE  Will order hip block with anesthesia

## 2022-06-04 ENCOUNTER — Inpatient Hospital Stay (HOSPITAL_COMMUNITY): Payer: Medicaid Other | Admitting: Anesthesiology

## 2022-06-04 ENCOUNTER — Other Ambulatory Visit: Payer: Self-pay

## 2022-06-04 DIAGNOSIS — Z716 Tobacco abuse counseling: Secondary | ICD-10-CM | POA: Diagnosis not present

## 2022-06-04 DIAGNOSIS — S72002A Fracture of unspecified part of neck of left femur, initial encounter for closed fracture: Secondary | ICD-10-CM

## 2022-06-04 HISTORY — DX: Fracture of unspecified part of neck of left femur, initial encounter for closed fracture: S72.002A

## 2022-06-04 LAB — SURGICAL PCR SCREEN
MRSA, PCR: NEGATIVE
Staphylococcus aureus: NEGATIVE

## 2022-06-04 MED ORDER — DEXAMETHASONE SODIUM PHOSPHATE 4 MG/ML IJ SOLN
INTRAMUSCULAR | Status: DC | PRN
  Administered 2022-06-04: 4 mg via PERINEURAL

## 2022-06-04 MED ORDER — ROPIVACAINE HCL 5 MG/ML IJ SOLN
INTRAMUSCULAR | Status: DC | PRN
  Administered 2022-06-04: 30 mL via PERINEURAL

## 2022-06-04 MED ORDER — MUPIROCIN 2 % EX OINT
1.0000 | TOPICAL_OINTMENT | Freq: Two times a day (BID) | CUTANEOUS | Status: DC
  Administered 2022-06-04 – 2022-06-07 (×6): 1 via NASAL
  Filled 2022-06-04 (×2): qty 22
  Filled 2022-06-04: qty 44

## 2022-06-04 MED ORDER — INFLUENZA VAC SPLIT QUAD 0.5 ML IM SUSY: 0.5000 mL | PREFILLED_SYRINGE | INTRAMUSCULAR | Status: DC | PRN

## 2022-06-04 MED ORDER — FENTANYL CITRATE (PF) 100 MCG/2ML IJ SOLN
INTRAMUSCULAR | Status: AC
  Filled 2022-06-04: qty 2

## 2022-06-04 MED ORDER — VITAMIN D (ERGOCALCIFEROL) 1.25 MG (50000 UNIT) PO CAPS
50000.0000 [IU] | ORAL_CAPSULE | ORAL | Status: DC
  Administered 2022-06-04: 50000 [IU] via ORAL
  Filled 2022-06-04: qty 1

## 2022-06-04 MED ORDER — FENTANYL CITRATE PF 50 MCG/ML IJ SOSY
50.0000 ug | PREFILLED_SYRINGE | Freq: Once | INTRAMUSCULAR | Status: AC
  Administered 2022-06-04: 50 ug via INTRAVENOUS

## 2022-06-04 MED ORDER — ENOXAPARIN SODIUM 40 MG/0.4ML IJ SOSY
40.0000 mg | PREFILLED_SYRINGE | Freq: Once | INTRAMUSCULAR | Status: AC
  Administered 2022-06-04: 40 mg via SUBCUTANEOUS
  Filled 2022-06-04: qty 0.4

## 2022-06-04 NOTE — H&P (View-Only) (Signed)
ORTHOPAEDIC CONSULTATION  REQUESTING PHYSICIAN: Darliss Cheney, MD  PCP:  Charlott Rakes, MD  Chief Complaint: Fall, left hip pain   HPI: Felicia Perkins is a 46 y.o. female with medical history significant of high-grade right carotid stenosis status post enterectomy on Plavix, HTN, COPD, alcohol abuse, anxiety/depression, who presented to [facility] ED after a fall at home the same day. She reports that she stepped on her dog's bed, and slid which caused her to fall. In the ED, imaging revealed nondisplaced left femoral neck fracture.   Today, on exam she is resting in her bed on the floor. Her RN at the bedside. The patient tells me that her daughter was killed after being hit with a stray bullet just last week. Her funeral is planned for this coming Sunday, and it is very important to the patient that she be there for this. She tells me she lives at home with her daughter. She had a right foot surgery earlier this year by Dr. Sherryle Lis, but otherwise denies orthopaedic surgeries. She does take plavix with last dose on ?Saturday.   Past Medical History:  Diagnosis Date   Abdominal pain, epigastric 12/16/2014   Acute on chronic pancreatitis ([place]) 03/22/2019   Acute pancreatitis    Alcohol abuse    Alcohol dependence ([place]) 12/16/2012   Medical detox successful    Alcohol induced acute pancreatitis 5/46/2703   Alcoholic cirrhosis of liver without ascites ([place]) 10/15/2016   Anxiety    Bipolar 1 disorder (HCC)    Cancer ([place])    cervical cancer   Chest pain with moderate risk for cardiac etiology 12/15/2014   Cirrhosis ([place])    COPD (chronic obstructive pulmonary disease) ([place]) 09/04/2016   Coronary artery disease    Depression    Duodenitis 11/22/2017   Eczema 10/09/2018   Elevated liver enzymes 12/22/2014   Gastritis and gastroduodenitis 09/04/2016   GERD (gastroesophageal reflux disease)    Nondisplaced fracture of neck of fifth metacarpal bone, right hand, initial encounter for closed fracture  08/26/2017   Pancreatitis    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 Early 20's. Normal   ENDARTERECTOMY Right 01/26/2021   Procedure: RIGHT CAROTID ARTERY ENDARTERECTOMY;  Surgeon: Waynetta Sandy, MD;  Location: [facility];  Service: Vascular;  Laterality: Right;   ESOPHAGOGASTRODUODENOSCOPY (EGD) WITH PROPOFOL N/A 01/02/2018   Procedure: ESOPHAGOGASTRODUODENOSCOPY (EGD) WITH PROPOFOL;  Surgeon: Milus Banister, MD;  Location: [facility];  Service: Endoscopy;  Laterality: N/A;   EUS N/A 01/02/2018   Procedure: UPPER ENDOSCOPIC ULTRASOUND (EUS) RADIAL;  Surgeon: Milus Banister, MD;  Location: [facility];  Service: Endoscopy;  Laterality: N/A;   FOOT SURGERY     OTHER SURGICAL HISTORY     ovarian surgery   TUBAL LIGATION     Social History   Socioeconomic History   Marital status: Single    Spouse name: Not on file   Number of children: 3   Years of education: Not on file   Highest education level: Not on file  Occupational History   Occupation: homemaker  Tobacco Use  Smoking status: Every Day    Packs/day: 0.50    Types: Cigarettes   Smokeless tobacco: Never   Tobacco comments:    2-3 cigarettes daily  Vaping Use   Vaping Use: Never used  Substance and Sexual Activity   Alcohol use: Yes   Drug use: No   Sexual activity: Yes    Birth control/protection: Condom  Other Topics Concern   Not on file  Social History Narrative   Not on file   Social Determinants of Health   Financial Resource Strain: Not on file  Food Insecurity: No Food Insecurity (06/03/2022)   Hunger Vital Sign    Worried About  Running Out of Food in the Last Year: Never true    Ran Out of Food in the Last Year: Never true  Transportation Needs: No Transportation Needs (06/03/2022)   PRAPARE - Hydrologist (Medical): No    Lack of Transportation (Non-Medical): No  Physical Activity: Not on file  Stress: Not on file  Social Connections: Not on file   Family History  Problem Relation Age of Onset   Hypertension Mother    Breast cancer Mother 12   Pancreatitis Mother    Hypertension Father    Heart attack Cousin 21       died in 6s of MI   Breast cancer Maternal Aunt    Breast cancer Maternal Aunt    Colon cancer Neg Hx    Esophageal cancer Neg Hx    Rectal cancer Neg Hx    Stomach cancer Neg Hx    No Known Allergies Prior to Admission medications   Medication Sig Start Date End Date Taking? Authorizing Provider  clopidogrel (PLAVIX) 75 MG tablet TAKE 1 TABLET BY MOUTH EVERY DAY 11/23/21  Yes Angelia Mould, MD  albuterol (VENTOLIN HFA) 108 (90 Base) MCG/ACT inhaler Inhale 1-2 puffs into the lungs every 6 (six) hours as needed for wheezing or shortness of breath. 11/01/21   Charlott Rakes, MD  amLODipine (NORVASC) 2.5 MG tablet Take 1 tablet (2.5 mg total) by mouth daily. 03/15/21 03/13/22  Tobb, Kardie, DO  cetirizine (ZYRTEC) 10 MG tablet Take 1 tablet (10 mg total) by mouth daily. 11/01/21   Charlott Rakes, MD  famotidine (PEPCID) 20 MG tablet Take 1 tablet (20 mg total) by mouth 2 (two) times daily. Patient taking differently: Take 20 mg by mouth daily. 08/24/20   [place] Saprina Chuong, PA-C  fluticasone (FLONASE) 50 MCG/ACT nasal spray Place 2 sprays into both nostrils daily. 11/01/21   Charlott Rakes, MD  gabapentin (NEURONTIN) 300 MG capsule Take 1 capsule (300 mg total) by mouth 3 (three) times daily for 7 days. 12/29/21 03/13/22  McDonald, Stephan Minister, DPM  hydrOXYzine (ATARAX/VISTARIL) 25 MG tablet Take 25 mg by mouth daily as needed for itching. 12/09/20   [provider]  lidocaine (XYLOCAINE) 2 % solution Use as directed 15 mLs in the mouth or throat every 6 (six) hours as needed for mouth pain. 03/13/22   Carvel Getting, NP  mometasone-formoterol (DULERA) 200-5 MCG/ACT AERO Inhale 2 puffs into the lungs daily as needed. 11/01/21   Charlott Rakes, MD  olopatadine (PATANOL) 0.1 % ophthalmic solution Place 1 drop into both eyes 2 (two) times daily. 11/01/21   Charlott Rakes, MD  pantoprazole (PROTONIX) 40 MG tablet Take 1 tablet (40 mg total) by mouth daily. 05/05/22   Charlott Rakes, MD  rosuvastatin (CRESTOR) 10 MG tablet TAKE 1 TABLET BY MOUTH  EVERY DAY 11/21/21   Angelia Mould, MD  cloNIDine (CATAPRES) 0.1 MG tablet Take 1 tablet (0.1 mg total) by mouth at bedtime. For hot flashes Patient not taking: Reported on 10/17/2019 01/13/19 08/15/20  Charlott Rakes, MD   CT Hip Left Wo Contrast  Result Date: 06/03/2022 CLINICAL DATA:  Left hip fracture after fall EXAM: CT OF THE LEFT HIP WITHOUT CONTRAST TECHNIQUE: Multidetector CT imaging of the left hip was performed according to the standard protocol. Multiplanar CT image reconstructions were also generated. RADIATION DOSE REDUCTION: This exam was performed according to the departmental dose-optimization program which includes automated exposure control, adjustment of the mA and/or kV according to patient size and/or use of iterative reconstruction technique. COMPARISON:  Same-day x-ray, CT 10/24/2020 FINDINGS: Bones/Joint/Cartilage Acute subcapital fracture of the left femoral neck with minimal impaction. No significant displacement or angulation. No fracture involvement of the femoral head or intertrochanteric region. Hip joint alignment is maintained without dislocation. Intact bony pelvis without fracture or diastasis. Small sclerotic lesion within the posterior left ilium is stable from prior CT (series 5, image 18), likely benign fibro-osseous lesion. No suspicious lytic or sclerotic bone lesion. Bilateral hip  joint spaces are preserved. Moderate-sized left hip joint lipohemarthrosis. Ligaments Suboptimally assessed by CT. Muscles and Tendons No acute musculotendinous findings by CT. Soft tissues Negative for hematoma within the soft tissues. No inguinal or intrapelvic lymphadenopathy. Atherosclerotic vascular calcifications are present. IMPRESSION: 1. Acute subcapital fracture of the left femoral neck with minimal impaction. 2. Moderate-sized left hip joint lipohemarthrosis. Electronically Signed   By: Davina Poke D.O.   On: 06/03/2022 13:01   CT Head Wo Contrast  Result Date: 06/03/2022 CLINICAL DATA:  Head trauma, moderate to severe. EXAM: CT HEAD WITHOUT CONTRAST TECHNIQUE: Contiguous axial images were obtained from the base of the skull through the vertex without intravenous contrast. RADIATION DOSE REDUCTION: This exam was performed according to the departmental dose-optimization program which includes automated exposure control, adjustment of the mA and/or kV according to patient size and/or use of iterative reconstruction technique. COMPARISON:  None Available. FINDINGS: Brain: No hydrocephalus. No mass, hemorrhage, edema or other evidence of acute parenchymal abnormality. No extra-axial hemorrhage. Vascular: No hyperdense vessel or unexpected calcification. Skull: Normal. Negative for fracture or focal lesion. Sinuses/Orbits: No acute finding. Other: None. IMPRESSION: Negative head CT. No intracranial hemorrhage or skull fracture. Electronically Signed   By: Franki Cabot M.D.   On: 06/03/2022 12:51   DG Chest 1 View  Result Date: 06/03/2022 CLINICAL DATA:  Preop chest x-ray. EXAM: CHEST  1 VIEW COMPARISON:  08/15/2020 FINDINGS: Normal heart size and mediastinal contours. No acute infiltrate or edema. No effusion or pneumothorax. No acute osseous findings. IMPRESSION: Negative portable chest. Electronically Signed   By: Jorje Guild M.D.   On: 06/03/2022 09:55   DG Hip Unilat W or Wo Pelvis  2-3 Views Left  Result Date: 06/03/2022 CLINICAL DATA:  Fall with pain EXAM: DG HIP (WITH OR WITHOUT PELVIS) 3V LEFT COMPARISON:  None Available. FINDINGS: Left femoral neck fracture with mild impaction. No dislocation. No degenerative change. Subjective osteopenia. IMPRESSION: Subcapital left femoral neck fracture with mild impaction. Electronically Signed   By: Jorje Guild M.D.   On: 06/03/2022 09:52    Positive ROS: All other systems have been reviewed and were otherwise negative with the exception of those mentioned in the HPI and as above.  Physical Exam: General: Alert, no acute distress   MUSCULOSKELETAL:  Left LE: LLE shortened and externally  rotated. Mild ecchymosis anteriorly, no skin lesions otherwise. ROM deferred. Patient moves foot/ankle/toes without difficulty. Distal pulses intact. Sensation intact.   Assessment: Nondisplaced left femoral neck fracture   Plan: I discussed with patient her diagnosis and proposed treatment plan of percutaneous screw fixation of left femoral neck fracture. She is in agreement. We will plan for this on Tuesday 10/24. She may have regular diet today. We will d/c gabapentin as she was not taking this regularly at home and may increase side effects along with narcotics perioperatively. NPO after midnight. NWB LLE for now.     Irving Copas, PA-C Cell [phone]   06/04/2022 8:50 AM

## 2022-06-04 NOTE — Anesthesia Preprocedure Evaluation (Signed)
Anesthesia Evaluation    Reviewed: Allergy & Precautions, H&P , Patient's Chart, lab work & pertinent test results, Unable to perform ROS - Chart review only  Airway Mallampati: I  TM Distance: >3 FB Neck ROM: Full    Dental  (+) Teeth Intact, Dental Advisory Given, Poor Dentition, Chipped   Pulmonary COPD, Current Smoker and Patient abstained from smoking.,    Pulmonary exam normal breath sounds clear to auscultation       Cardiovascular Exercise Tolerance: Good hypertension, + CAD  Normal cardiovascular exam Rhythm:Regular Rate:Normal  Echo 12/2020 1. Left ventricular ejection fraction, by estimation, is 70 to 75%. The left ventricle has hyperdynamic function. The left ventricle has no regional wall motion abnormalities. Left ventricular diastolic parameters were normal.  2. Right ventricular systolic function is normal. The right ventricular size is normal.  3. The mitral valve is normal in structure. No evidence of mitral valve regurgitation.  4. The aortic valve is normal in structure. Aortic valve regurgitation is not visualized.   Comparison(s): The left ventricular function is unchanged.    Neuro/Psych PSYCHIATRIC DISORDERS Anxiety Depression Bipolar Disorder    GI/Hepatic GERD  Medicated and Controlled,(+) Cirrhosis     substance abuse  alcohol use,   Endo/Other    Renal/GU      Musculoskeletal   Abdominal   Peds  Hematology   Anesthesia Other Findings   Reproductive/Obstetrics                             Anesthesia Physical  Anesthesia Plan  ASA: 4  Anesthesia Plan: Regional   Post-op Pain Management:    Induction:   PONV Risk Score and Plan:   Airway Management Planned:   Additional Equipment:   Intra-op Plan:   Post-operative Plan:   Informed Consent: I have reviewed the patients History and Physical, chart, labs and discussed the procedure including the  risks, benefits and alternatives for the proposed anesthesia with the patient or authorized representative who has indicated his/her understanding and acceptance.       Plan Discussed with:   Anesthesia Plan Comments: (PAT note by Karoline Caldwell, PA-C (from 01/18/2021): Recent cardiology evaluation for report of chest discomfort. Testing was reassuring. Cardiology clearance per telephone encounter 01/11/21, "Chart reviewed as part of pre-operative protocol coverage.Cardiac CTA 12/14/20 with coronary calcium score of 0. Echocardiogram 12/2020 LVEF 70-75%, no RWMA, normal diastolic parameters, no significant valvular abnormalities. Given past medical history and time since last visit, based on ACC/AHA guidelines,Felicia M Carterwould be at acceptable risk for the planned procedure without further cardiovascular testing. The patient was advised that ifshedevelops new symptoms prior to surgery to contact our office to arrange for a follow-up visit, and sheverbalized understanding."  Preop labs reviewed, mild transaminates which is not new, otherwise unremarkable.   EKG 12/01/20: Sinus rhythm with short PR. Septal infarct, age undetermined. Rate 84.  CHEST - 2 VIEW 08/15/20: COMPARISON: February 28, 2020  FINDINGS: Lungs are clear. Heart size and pulmonary vascularity are normal. No adenopathy. No bone lesions.  IMPRESSION: Lungs clear. Cardiac silhouette normal.  TTE 01/03/21: 1. Left ventricular ejection fraction, by estimation, is 70 to 75%. The  left ventricle has hyperdynamic function. The left ventricle has no  regional wall motion abnormalities. Left ventricular diastolic parameters  were normal.  2. Right ventricular systolic function is normal. The right ventricular  size is normal.  3. The mitral valve is normal in structure. No evidence of  mitral valve  regurgitation.  4. The aortic valve is normal in structure. Aortic valve regurgitation is  not visualized.   Comparison(s):  The left ventricular function is unchanged.   Coronary CTA 12/12/20: IMPRESSION: 1. Calcium score 0  2. Normal right dominant coronary arteries  3. Normal aortic root 2.9 cm  Carotid duplex 11/18/20: Summary:  Right Carotid: Velocities in the right ICA are consistent with a 80-99% stenosis.   Left Carotid: Velocities in the left ICA are consistent with a 40-59% stenosis.   Vertebrals: Right vertebral artery demonstrates antegrade flow. Left vertebral artery was not visualized.    )        Anesthesia Quick Evaluation

## 2022-06-04 NOTE — Anesthesia Postprocedure Evaluation (Signed)
Anesthesia Post Note  Patient: Felicia Perkins  Procedure(s) Performed: AN AD HOC NERVE BLOCK     Patient location during evaluation: PACU Anesthesia Type: Regional Level of consciousness: awake and alert Pain management: pain level controlled Vital Signs Assessment: post-procedure vital signs reviewed and stable Respiratory status: spontaneous breathing Cardiovascular status: stable Anesthetic complications: no   No notable events documented.  Last Vitals:  Vitals:   06/04/22 1222 06/04/22 1236  BP: (!) 128/97 (!) 158/93  Pulse: 90 86  Resp: (!) 21 13  Temp:  37.3 C  SpO2: 95% 96%    Last Pain:  Vitals:   06/04/22 0952  TempSrc:   PainSc: [place]

## 2022-06-04 NOTE — Consult Note (Signed)
ORTHOPAEDIC CONSULTATION  REQUESTING PHYSICIAN: Darliss Cheney, MD  PCP:  Charlott Rakes, MD  Chief Complaint: Fall, left hip pain   HPI: Felicia Perkins is a 46 y.o. female with medical history significant of high-grade right carotid stenosis status post enterectomy on Plavix, HTN, COPD, alcohol abuse, anxiety/depression, who presented to [facility] ED after a fall at home the same day. She reports that she stepped on her dog's bed, and slid which caused her to fall. In the ED, imaging revealed nondisplaced left femoral neck fracture.   Today, on exam she is resting in her bed on the floor. Her RN at the bedside. The patient tells me that her daughter was killed after being hit with a stray bullet just last week. Her funeral is planned for this coming Sunday, and it is very important to the patient that she be there for this. She tells me she lives at home with her daughter. She had a right foot surgery earlier this year by Dr. Sherryle Lis, but otherwise denies orthopaedic surgeries. She does take plavix with last dose on ?Saturday.   Past Medical History:  Diagnosis Date   Abdominal pain, epigastric 12/16/2014   Acute on chronic pancreatitis ([place]) 03/22/2019   Acute pancreatitis    Alcohol abuse    Alcohol dependence ([place]) 12/16/2012   Medical detox successful    Alcohol induced acute pancreatitis 7/90/2409   Alcoholic cirrhosis of liver without ascites ([place]) 10/15/2016   Anxiety    Bipolar 1 disorder (HCC)    Cancer ([place])    cervical cancer   Chest pain with moderate risk for cardiac etiology 12/15/2014   Cirrhosis ([place])    COPD (chronic obstructive pulmonary disease) ([place]) 09/04/2016   Coronary artery disease    Depression    Duodenitis 11/22/2017   Eczema 10/09/2018   Elevated liver enzymes 12/22/2014   Gastritis and gastroduodenitis 09/04/2016   GERD (gastroesophageal reflux disease)    Nondisplaced fracture of neck of fifth metacarpal bone, right hand, initial encounter for closed fracture  08/26/2017   Pancreatitis    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 Early 20's. Normal   ENDARTERECTOMY Right 01/26/2021   Procedure: RIGHT CAROTID ARTERY ENDARTERECTOMY;  Surgeon: Waynetta Sandy, MD;  Location: [place];  Service: Vascular;  Laterality: Right;   ESOPHAGOGASTRODUODENOSCOPY (EGD) WITH PROPOFOL N/A 01/02/2018   Procedure: ESOPHAGOGASTRODUODENOSCOPY (EGD) WITH PROPOFOL;  Surgeon: Milus Banister, MD;  Location: [facility];  Service: Endoscopy;  Laterality: N/A;   EUS N/A 01/02/2018   Procedure: UPPER ENDOSCOPIC ULTRASOUND (EUS) RADIAL;  Surgeon: Milus Banister, MD;  Location: [facility];  Service: Endoscopy;  Laterality: N/A;   FOOT SURGERY     OTHER SURGICAL HISTORY     ovarian surgery   TUBAL LIGATION     Social History   Socioeconomic History   Marital status: Single    Spouse name: Not on file   Number of children: 3   Years of education: Not on file   Highest education level: Not on file  Occupational History   Occupation: homemaker  Tobacco Use  Smoking status: Every Day    Packs/day: 0.50    Types: Cigarettes   Smokeless tobacco: Never   Tobacco comments:    2-3 cigarettes daily  Vaping Use   Vaping Use: Never used  Substance and Sexual Activity   Alcohol use: Yes   Drug use: No   Sexual activity: Yes    Birth control/protection: Condom  Other Topics Concern   Not on file  Social History Narrative   Not on file   Social Determinants of Health   Financial Resource Strain: Not on file  Food Insecurity: No Food Insecurity (06/03/2022)   Hunger Vital Sign    Worried About  Running Out of Food in the Last Year: Never true    Ran Out of Food in the Last Year: Never true  Transportation Needs: No Transportation Needs (06/03/2022)   PRAPARE - Hydrologist (Medical): No    Lack of Transportation (Non-Medical): No  Physical Activity: Not on file  Stress: Not on file  Social Connections: Not on file   Family History  Problem Relation Age of Onset   Hypertension Mother    Breast cancer Mother 32   Pancreatitis Mother    Hypertension Father    Heart attack Cousin 75       died in 5s of MI   Breast cancer Maternal Aunt    Breast cancer Maternal Aunt    Colon cancer Neg Hx    Esophageal cancer Neg Hx    Rectal cancer Neg Hx    Stomach cancer Neg Hx    No Known Allergies Prior to Admission medications   Medication Sig Start Date End Date Taking? Authorizing Provider  clopidogrel (PLAVIX) 75 MG tablet TAKE 1 TABLET BY MOUTH EVERY DAY 11/23/21  Yes Angelia Mould, MD  albuterol (VENTOLIN HFA) 108 (90 Base) MCG/ACT inhaler Inhale 1-2 puffs into the lungs every 6 (six) hours as needed for wheezing or shortness of breath. 11/01/21   Charlott Rakes, MD  amLODipine (NORVASC) 2.5 MG tablet Take 1 tablet (2.5 mg total) by mouth daily. 03/15/21 03/13/22  Tobb, Kardie, DO  cetirizine (ZYRTEC) 10 MG tablet Take 1 tablet (10 mg total) by mouth daily. 11/01/21   Charlott Rakes, MD  famotidine (PEPCID) 20 MG tablet Take 1 tablet (20 mg total) by mouth 2 (two) times daily. Patient taking differently: Take 20 mg by mouth daily. 08/24/20   [place] Marlon Suleiman, PA-C  fluticasone (FLONASE) 50 MCG/ACT nasal spray Place 2 sprays into both nostrils daily. 11/01/21   Charlott Rakes, MD  gabapentin (NEURONTIN) 300 MG capsule Take 1 capsule (300 mg total) by mouth 3 (three) times daily for 7 days. 12/29/21 03/13/22  McDonald, Stephan Minister, DPM  hydrOXYzine (ATARAX/VISTARIL) 25 MG tablet Take 25 mg by mouth daily as needed for itching. 12/09/20   [provider]  lidocaine (XYLOCAINE) 2 % solution Use as directed 15 mLs in the mouth or throat every 6 (six) hours as needed for mouth pain. 03/13/22   Carvel Getting, NP  mometasone-formoterol (DULERA) 200-5 MCG/ACT AERO Inhale 2 puffs into the lungs daily as needed. 11/01/21   Charlott Rakes, MD  olopatadine (PATANOL) 0.1 % ophthalmic solution Place 1 drop into both eyes 2 (two) times daily. 11/01/21   Charlott Rakes, MD  pantoprazole (PROTONIX) 40 MG tablet Take 1 tablet (40 mg total) by mouth daily. 05/05/22   Charlott Rakes, MD  rosuvastatin (CRESTOR) 10 MG tablet TAKE 1 TABLET BY MOUTH  EVERY DAY 11/21/21   Angelia Mould, MD  cloNIDine (CATAPRES) 0.1 MG tablet Take 1 tablet (0.1 mg total) by mouth at bedtime. For hot flashes Patient not taking: Reported on 10/17/2019 01/13/19 08/15/20  Charlott Rakes, MD   CT Hip Left Wo Contrast  Result Date: 06/03/2022 CLINICAL DATA:  Left hip fracture after fall EXAM: CT OF THE LEFT HIP WITHOUT CONTRAST TECHNIQUE: Multidetector CT imaging of the left hip was performed according to the standard protocol. Multiplanar CT image reconstructions were also generated. RADIATION DOSE REDUCTION: This exam was performed according to the departmental dose-optimization program which includes automated exposure control, adjustment of the mA and/or kV according to patient size and/or use of iterative reconstruction technique. COMPARISON:  Same-day x-ray, CT 10/24/2020 FINDINGS: Bones/Joint/Cartilage Acute subcapital fracture of the left femoral neck with minimal impaction. No significant displacement or angulation. No fracture involvement of the femoral head or intertrochanteric region. Hip joint alignment is maintained without dislocation. Intact bony pelvis without fracture or diastasis. Small sclerotic lesion within the posterior left ilium is stable from prior CT (series 5, image 18), likely benign fibro-osseous lesion. No suspicious lytic or sclerotic bone lesion. Bilateral hip  joint spaces are preserved. Moderate-sized left hip joint lipohemarthrosis. Ligaments Suboptimally assessed by CT. Muscles and Tendons No acute musculotendinous findings by CT. Soft tissues Negative for hematoma within the soft tissues. No inguinal or intrapelvic lymphadenopathy. Atherosclerotic vascular calcifications are present. IMPRESSION: 1. Acute subcapital fracture of the left femoral neck with minimal impaction. 2. Moderate-sized left hip joint lipohemarthrosis. Electronically Signed   By: Davina Poke D.O.   On: 06/03/2022 13:01   CT Head Wo Contrast  Result Date: 06/03/2022 CLINICAL DATA:  Head trauma, moderate to severe. EXAM: CT HEAD WITHOUT CONTRAST TECHNIQUE: Contiguous axial images were obtained from the base of the skull through the vertex without intravenous contrast. RADIATION DOSE REDUCTION: This exam was performed according to the departmental dose-optimization program which includes automated exposure control, adjustment of the mA and/or kV according to patient size and/or use of iterative reconstruction technique. COMPARISON:  None Available. FINDINGS: Brain: No hydrocephalus. No mass, hemorrhage, edema or other evidence of acute parenchymal abnormality. No extra-axial hemorrhage. Vascular: No hyperdense vessel or unexpected calcification. Skull: Normal. Negative for fracture or focal lesion. Sinuses/Orbits: No acute finding. Other: None. IMPRESSION: Negative head CT. No intracranial hemorrhage or skull fracture. Electronically Signed   By: Franki Cabot M.D.   On: 06/03/2022 12:51   DG Chest 1 View  Result Date: 06/03/2022 CLINICAL DATA:  Preop chest x-ray. EXAM: CHEST  1 VIEW COMPARISON:  08/15/2020 FINDINGS: Normal heart size and mediastinal contours. No acute infiltrate or edema. No effusion or pneumothorax. No acute osseous findings. IMPRESSION: Negative portable chest. Electronically Signed   By: Jorje Guild M.D.   On: 06/03/2022 09:55   DG Hip Unilat W or Wo Pelvis  2-3 Views Left  Result Date: 06/03/2022 CLINICAL DATA:  Fall with pain EXAM: DG HIP (WITH OR WITHOUT PELVIS) 3V LEFT COMPARISON:  None Available. FINDINGS: Left femoral neck fracture with mild impaction. No dislocation. No degenerative change. Subjective osteopenia. IMPRESSION: Subcapital left femoral neck fracture with mild impaction. Electronically Signed   By: Jorje Guild M.D.   On: 06/03/2022 09:52    Positive ROS: All other systems have been reviewed and were otherwise negative with the exception of those mentioned in the HPI and as above.  Physical Exam: General: Alert, no acute distress   MUSCULOSKELETAL:  Left LE: LLE shortened and externally  rotated. Mild ecchymosis anteriorly, no skin lesions otherwise. ROM deferred. Patient moves foot/ankle/toes without difficulty. Distal pulses intact. Sensation intact.   Assessment: Nondisplaced left femoral neck fracture   Plan: I discussed with patient her diagnosis and proposed treatment plan of percutaneous screw fixation of left femoral neck fracture. She is in agreement. We will plan for this on Tuesday 10/24. She may have regular diet today. We will d/c gabapentin as she was not taking this regularly at home and may increase side effects along with narcotics perioperatively. NPO after midnight. NWB LLE for now.     Irving Copas, PA-C Cell [phone]   06/04/2022 8:50 AM

## 2022-06-04 NOTE — Plan of Care (Signed)
  Problem: Education: Goal: Knowledge of General Education information will improve Description: Including pain rating scale, medication(s)/side effects and non-pharmacologic comfort measures Outcome: Not Progressing   Problem: Health Behavior/Discharge Planning: Goal: Ability to manage health-related needs will improve Outcome: Not Progressing   Problem: Clinical Measurements: Goal: Ability to maintain clinical measurements within normal limits will improve Outcome: Not Progressing Goal: Will remain free from infection Outcome: Not Progressing Goal: Diagnostic test results will improve Outcome: Not Progressing Goal: Respiratory complications will improve Outcome: Not Progressing Goal: Cardiovascular complication will be avoided Outcome: Not Progressing   Problem: Activity: Goal: Risk for activity intolerance will decrease Outcome: Not Progressing   Problem: Nutrition: Goal: Adequate nutrition will be maintained Outcome: Not Progressing   Problem: Elimination: Goal: Will not experience complications related to bowel motility Outcome: Not Progressing Goal: Will not experience complications related to urinary retention Outcome: Not Progressing   Problem: Skin Integrity: Goal: Risk for impaired skin integrity will decrease Outcome: Not Progressing   Problem: Safety: Goal: Ability to remain free from injury will improve Outcome: Not Progressing

## 2022-06-04 NOTE — Progress Notes (Signed)
Initial Nutrition Assessment  DOCUMENTATION CODES:   Non-severe (moderate) malnutrition in context of chronic illness  INTERVENTION:  Ensure Enlive po BID, each supplement provides 350 kcal and 20 grams of protein. MVI with minerals daily Provided coupons for nutrition supplements Reached out to MD regarding Vitamin D repletion  NUTRITION DIAGNOSIS:   Moderate Malnutrition related to chronic illness (alcohol use, carotid stenosis, anxiety/depression) as evidenced by mild fat depletion, moderate fat depletion, severe muscle depletion, moderate muscle depletion.  GOAL:   Patient will meet greater than or equal to 90% of their needs  MONITOR:   PO intake, Supplement acceptance, Labs, Weight trends  REASON FOR ASSESSMENT:   Consult Hip fracture protocol  ASSESSMENT:   Pt admitted after a fall leading to nondisplaced L femoral neck fracture. PMH significant for high-grade R carotid stenosis, HTN, COPD, alcohol use and anxiety/depression.  Plans for percutaneous screw fixation of L femoral neck fracture on Tuesday.  Pt sitting up in bed with family present at bedside. She reports very minimal PO intake within the last week d/t her daughter passing away a week ago and having no appetite.   Pt works at [employer] and reports that she typically has 1 full course meal daily and then will snack throughout the day while at work. She has never been a breakfast eater and generally doesn't eat before 11am. Her doctor recommended she start drinking Ensure so she generally drinks 2 per day.   Around this time of year every year pt reports that she has a psoriasis flare which she is currently experiencing. She typically takes a steroid when this occurs which also helps with her PO intake but is not currently taking any medications for this.   She reports that her weight usually fluctuates up and down. She endorses a recent weight of 111 lbs and within the last week is down to 107 lbs. Reviewed  weight history. Her weight appears to have been increasing since May however within the last month she is noted to have had a 3.6% weight loss which is not clinically significant for time frame.   Medications: pepcid, folvite, MVI, protonix, thiamine  Labs: sodium 134, BUN <5, anion gap 16, Vitamin D 9.20 (L)  NUTRITION - FOCUSED PHYSICAL EXAM:  Flowsheet Row Most Recent Value  Orbital Region Mild depletion  Upper Arm Region Moderate depletion  Thoracic and Lumbar Region Mild depletion  Buccal Region Moderate depletion  Temple Region Moderate depletion  Clavicle Bone Region Moderate depletion  Clavicle and Acromion Bone Region Moderate depletion  Scapular Bone Region Moderate depletion  Dorsal Hand Severe depletion  Patellar Region Severe depletion  Anterior Thigh Region Severe depletion  Posterior Calf Region Severe depletion  Edema (RD Assessment) None  Hair Reviewed  Eyes Reviewed  Mouth Reviewed  Skin Other (Comment)  [dry patches on hand, pt reports psoriasis]  Nails Reviewed       Diet Order:   Diet Order             Diet NPO time specified  Diet effective ____           Diet NPO time specified  Diet effective midnight           Diet regular Room service appropriate? Yes; Fluid consistency: Thin  Diet effective now                   EDUCATION NEEDS:   Education needs have been addressed  Skin:  Skin Assessment: Reviewed RN Assessment  Last BM:  10/22  Height:   Ht Readings from Last 1 Encounters:  06/03/22 '5\' 4"'$  (1.626 m)    Weight:   Wt Readings from Last 1 Encounters:  06/03/22 48.5 kg   BMI:  Body mass index is 18.37 kg/m.  Estimated Nutritional Needs:   Kcal:  1500-1700  Protein:  75-90g  Fluid:  >/=1.5L  Clayborne Dana, RDN, LDN Clinical Nutrition

## 2022-06-04 NOTE — Anesthesia Procedure Notes (Signed)
Anesthesia Regional Block: Femoral nerve block   Pre-Anesthetic Checklist: , timeout performed,  Correct Patient, Correct Site, Correct Laterality,  Correct Procedure, Correct Position, site marked,  Risks and benefits discussed,  Surgical consent,  Pre-op evaluation,  At surgeon's request and post-op pain management  Laterality: Lower and Left  Prep: chloraprep       Needles:  Injection technique: Single-shot  Needle Type: Stimulator Needle - 80     Needle Length: 9cm  Needle Gauge: 22   Needle insertion depth: 6 cm   Additional Needles:   Procedures:, nerve stimulator,,, ultrasound used (permanent image in chart),,     Nerve Stimulator or Paresthesia:  Response: Patellar snap, 0.5 mA  Additional Responses:   Narrative:  Start time: 06/04/2022 12:09 PM End time: 06/04/2022 12:29 PM Injection made incrementally with aspirations every 5 mL.  Performed by: Personally  Anesthesiologist: Nolon Nations, MD  Additional Notes: BP cuff, EKG monitors applied. Sedation begun. Femoral artery palpated for location of nerve. After nerve location verified with U/S, anesthetic injected incrementally, slowly, and after negative aspirations under direct u/s guidance. Good perineural spread. Patient tolerated well.

## 2022-06-04 NOTE — Progress Notes (Signed)
PROGRESS NOTE    Felicia Perkins  TWS:[id number] DOB: 19-Dec-1975 DOA: 06/03/2022 PCP: Charlott Rakes, MD   Brief Narrative:  HPI: Felicia Perkins is a 46 y.o. female with medical history significant of high-grade right carotid stenosis status post enterectomy on Plavix, HTN, COPD, alcohol abuse, anxiety/depression, presented with fall and left hip pain.   Patient tripped on her dog house and fell on left hip, no LOC, no head injury.  She was able to stand up and walk however, soon she started to feel 10/10 sharp pain of left hip and retired to bed.  Denies any shortness of breath chest pain, she does been drinking occasionally, last drink was last night.   ED Course: Vital signs stable, afebrile, nonhypotensive no tachycardia no hypoxia.  Left hip CT showed acute subcapital fracture of left femoral neck with minimal impact.    Assessment & Plan:   Active Problems:   COPD (chronic obstructive pulmonary disease) (HCC)   Tobacco abuse counseling   Anxiety   Fracture of femoral neck, left (HCC)  Left femoral neck fracture: Secondary to fall.  Ortho on board.  Surgery posted for tomorrow in order to allow some time for Plavix washout.  COPD: Currently asymptomatic.  No wheezes on exam.  Continue current maintaining breathing treatments.  History of high-grade right-sided carotid stenosis s/p endarterectomy: Plavix on hold due to pending surgery.  History of alcohol dependence/abuse: No signs of withdrawal.  Continue CIWA protocol.  Tobacco abuse/dependence: Smoking cessation counseling provided.  Moderate protein calorie malnutrition: BMI only 18.  Nutrition consulted.  Hypertension: Controlled.  Continue midodrine.  Hyperlipidemia: Continue Crestor.  GERD: Continue PPI.  DVT prophylaxis: Place and maintain sequential compression device Start: 06/03/22 1412 since surgery is postponed till tomorrow, I will give her 1 dose of Lovenox today.   Code Status: Full Code  Family  Communication:  None present at bedside.  Plan of care discussed with patient in length and he/she verbalized understanding and agreed with it.  Status is: Inpatient Remains inpatient appropriate because: Patient scheduled for hip repair surgery tomorrow.   Estimated body mass index is 18.37 kg/m as calculated from the following:   Height as of this encounter: '5\' 4"'$  (1.626 m).   Weight as of this encounter: 48.5 kg.    Nutritional Assessment: Body mass index is 18.37 kg/m.Marland Kitchen Seen by dietician.  I agree with the assessment and plan as outlined below: Nutrition Status:        . Skin Assessment: I have examined the patient's skin and I agree with the wound assessment as performed by the wound care RN as outlined below:    Consultants:  Orthopedics  Procedures:  None  Antimicrobials:  Anti-infectives (From admission, onward)    None         Subjective: Patient seen and examined.  Pain well controlled.  She had some questions for orthopedics about the method of surgery.  No other complaint and no questions for me.  Objective: Vitals:   06/03/22 2040 06/03/22 2300 06/04/22 0000 06/04/22 0553  BP: 124/82   (!) 127/90  Pulse: 74  76 75  Resp:      Temp: 98.7 F (37.1 C)     TempSrc: Oral     SpO2: 97% 99%  97%  Weight:      Height:       No intake or output data in the 24 hours ending 06/04/22 0811 Filed Weights   06/03/22 0906  Weight: 48.5 kg  Examination:  General exam: Appears calm and comfortable  Respiratory system: Clear to auscultation. Respiratory effort normal. Cardiovascular system: S1 & S2 heard, RRR. No JVD, murmurs, rubs, gallops or clicks. No pedal edema. Gastrointestinal system: Abdomen is nondistended, soft and nontender. No organomegaly or masses felt. Normal bowel sounds heard. Central nervous system: Alert and oriented. No focal neurological deficits. Skin: No rashes, lesions or ulcers Psychiatry: Judgement and insight appear normal.  Mood & affect appropriate.    Data Reviewed: I have personally reviewed following labs and imaging studies  CBC: Recent Labs  Lab 06/03/22 1004  WBC 9.7  HGB 13.4  HCT 39.9  MCV 96.1  PLT 782   Basic Metabolic Panel: Recent Labs  Lab 06/03/22 1004  NA 134*  K 3.8  CL 98  CO2 20*  GLUCOSE 106*  BUN <5*  CREATININE 0.56  CALCIUM 9.0   GFR: Estimated Creatinine Clearance: 66.6 mL/min (by C-G formula based on SCr of 0.56 mg/dL). Liver Function Tests: Recent Labs  Lab 06/03/22 1004  AST 28  ALT 20  ALKPHOS 92  BILITOT 0.5  PROT 6.8  ALBUMIN 3.9   No results for input(s): "LIPASE", "AMYLASE" in the last 168 hours. No results for input(s): "AMMONIA" in the last 168 hours. Coagulation Profile: No results for input(s): "INR", "PROTIME" in the last 168 hours. Cardiac Enzymes: No results for input(s): "CKTOTAL", "CKMB", "CKMBINDEX", "TROPONINI" in the last 168 hours. BNP (last 3 results) No results for input(s): "PROBNP" in the last 8760 hours. HbA1C: No results for input(s): "HGBA1C" in the last 72 hours. CBG: No results for input(s): "GLUCAP" in the last 168 hours. Lipid Profile: No results for input(s): "CHOL", "HDL", "LDLCALC", "TRIG", "CHOLHDL", "LDLDIRECT" in the last 72 hours. Thyroid Function Tests: No results for input(s): "TSH", "T4TOTAL", "FREET4", "T3FREE", "THYROIDAB" in the last 72 hours. Anemia Panel: No results for input(s): "VITAMINB12", "FOLATE", "FERRITIN", "TIBC", "IRON", "RETICCTPCT" in the last 72 hours. Sepsis Labs: No results for input(s): "PROCALCITON", "LATICACIDVEN" in the last 168 hours.  Recent Results (from the past 240 hour(s))  Surgical PCR screen     Status: None   Collection Time: 06/04/22  5:54 AM   Specimen: Nasal Mucosa; Nasal Swab  Result Value Ref Range Status   MRSA, PCR NEGATIVE NEGATIVE Final   Staphylococcus aureus NEGATIVE NEGATIVE Final    Comment: (NOTE) The Xpert SA Assay (FDA approved for NASAL specimens in  patients 32 years of age and older), is one component of a comprehensive surveillance program. It is not intended to diagnose infection nor to guide or monitor treatment. Performed at [facility], [place] [address], [place], [place] [address]      Radiology Studies: CT Hip Left Wo Contrast  Result Date: 06/03/2022 CLINICAL DATA:  Left hip fracture after fall EXAM: CT OF THE LEFT HIP WITHOUT CONTRAST TECHNIQUE: Multidetector CT imaging of the left hip was performed according to the standard protocol. Multiplanar CT image reconstructions were also generated. RADIATION DOSE REDUCTION: This exam was performed according to the departmental dose-optimization program which includes automated exposure control, adjustment of the mA and/or kV according to patient size and/or use of iterative reconstruction technique. COMPARISON:  Same-day x-ray, CT 10/24/2020 FINDINGS: Bones/Joint/Cartilage Acute subcapital fracture of the left femoral neck with minimal impaction. No significant displacement or angulation. No fracture involvement of the femoral head or intertrochanteric region. Hip joint alignment is maintained without dislocation. Intact bony pelvis without fracture or diastasis. Small sclerotic lesion within the posterior left ilium is  stable from prior CT (series 5, image 18), likely benign fibro-osseous lesion. No suspicious lytic or sclerotic bone lesion. Bilateral hip joint spaces are preserved. Moderate-sized left hip joint lipohemarthrosis. Ligaments Suboptimally assessed by CT. Muscles and Tendons No acute musculotendinous findings by CT. Soft tissues Negative for hematoma within the soft tissues. No inguinal or intrapelvic lymphadenopathy. Atherosclerotic vascular calcifications are present. IMPRESSION: 1. Acute subcapital fracture of the left femoral neck with minimal impaction. 2. Moderate-sized left hip joint lipohemarthrosis. Electronically Signed   By: Davina Poke D.O.   On: 06/03/2022  13:01   CT Head Wo Contrast  Result Date: 06/03/2022 CLINICAL DATA:  Head trauma, moderate to severe. EXAM: CT HEAD WITHOUT CONTRAST TECHNIQUE: Contiguous axial images were obtained from the base of the skull through the vertex without intravenous contrast. RADIATION DOSE REDUCTION: This exam was performed according to the departmental dose-optimization program which includes automated exposure control, adjustment of the mA and/or kV according to patient size and/or use of iterative reconstruction technique. COMPARISON:  None Available. FINDINGS: Brain: No hydrocephalus. No mass, hemorrhage, edema or other evidence of acute parenchymal abnormality. No extra-axial hemorrhage. Vascular: No hyperdense vessel or unexpected calcification. Skull: Normal. Negative for fracture or focal lesion. Sinuses/Orbits: No acute finding. Other: None. IMPRESSION: Negative head CT. No intracranial hemorrhage or skull fracture. Electronically Signed   By: Franki Cabot M.D.   On: 06/03/2022 12:51   DG Chest 1 View  Result Date: 06/03/2022 CLINICAL DATA:  Preop chest x-ray. EXAM: CHEST  1 VIEW COMPARISON:  08/15/2020 FINDINGS: Normal heart size and mediastinal contours. No acute infiltrate or edema. No effusion or pneumothorax. No acute osseous findings. IMPRESSION: Negative portable chest. Electronically Signed   By: Jorje Guild M.D.   On: 06/03/2022 09:55   DG Hip Unilat W or Wo Pelvis 2-3 Views Left  Result Date: 06/03/2022 CLINICAL DATA:  Fall with pain EXAM: DG HIP (WITH OR WITHOUT PELVIS) 3V LEFT COMPARISON:  None Available. FINDINGS: Left femoral neck fracture with mild impaction. No dislocation. No degenerative change. Subjective osteopenia. IMPRESSION: Subcapital left femoral neck fracture with mild impaction. Electronically Signed   By: Jorje Guild M.D.   On: 06/03/2022 09:52    Scheduled Meds:  amLODipine  2.5 mg Oral Daily   aspirin EC  81 mg Oral Daily   famotidine  20 mg Oral Daily   feeding  supplement  237 mL Oral BID BM   fluticasone  2 spray Each Nare Daily   folic acid  1 mg Oral Daily   gabapentin  300 mg Oral TID   influenza vac split quadrivalent PF  0.5 mL Intramuscular Tomorrow-1000   loratadine  10 mg Oral Daily   mometasone-formoterol  2 puff Inhalation BID   multivitamin with minerals  1 tablet Oral Daily   mupirocin ointment  1 Application Nasal BID   nicotine  21 mg Transdermal Daily   olopatadine  1 drop Both Eyes BID   pantoprazole  40 mg Oral Daily   rosuvastatin  10 mg Oral Daily   thiamine  100 mg Oral Daily   Or   thiamine  100 mg Intravenous Daily   Continuous Infusions:   LOS: 1 day   Darliss Cheney, MD [facility]  06/04/2022, 8:11 AM   *Please note that this is a verbal dictation therefore any spelling or grammatical errors are due to the "[employer]" system interpretation.  Please page via [place] and do not message via secure chat for urgent patient care  matters. Secure chat can be used for non urgent patient care matters.  How to contact the [facility] Attending or Consulting provider [place] or covering provider during after hours [place], for this patient?  Check the care team in [facility] and look for a) attending/consulting [facility] provider listed and b) the [facility] team listed. Page or secure chat [phone]. Log into [email or web address] and use [place]'s universal password to access. If you do not have the password, please contact the hospital operator. Locate the [facility] provider you are looking for under [facility] and page to a number that you can be directly reached. If you still have difficulty reaching the provider, please page the [facility] (Director on Call) for the Hospitalists listed on amion for assistance.

## 2022-06-05 ENCOUNTER — Inpatient Hospital Stay (HOSPITAL_COMMUNITY): Payer: Medicaid Other | Admitting: Anesthesiology

## 2022-06-05 ENCOUNTER — Inpatient Hospital Stay (HOSPITAL_COMMUNITY): Payer: Medicaid Other

## 2022-06-05 ENCOUNTER — Encounter (HOSPITAL_COMMUNITY)
Admission: EM | Disposition: A | Discharge status: Home Health | Payer: Self-pay | Source: Home / Self Care | Attending: Family Medicine

## 2022-06-05 ENCOUNTER — Encounter (HOSPITAL_COMMUNITY): Payer: Self-pay | Admitting: Internal Medicine

## 2022-06-05 DIAGNOSIS — I2510 Atherosclerotic heart disease of native coronary artery without angina pectoris: Secondary | ICD-10-CM

## 2022-06-05 DIAGNOSIS — F17200 Nicotine dependence, unspecified, uncomplicated: Secondary | ICD-10-CM

## 2022-06-05 DIAGNOSIS — S72002A Fracture of unspecified part of neck of left femur, initial encounter for closed fracture: Secondary | ICD-10-CM

## 2022-06-05 DIAGNOSIS — I10 Essential (primary) hypertension: Secondary | ICD-10-CM

## 2022-06-05 DIAGNOSIS — E440 Moderate protein-calorie malnutrition: Secondary | ICD-10-CM | POA: Insufficient documentation

## 2022-06-05 HISTORY — PX: HIP PINNING,CANNULATED: SHX1758

## 2022-06-05 LAB — CBC WITH DIFFERENTIAL/PLATELET
Abs Immature Granulocytes: 0.02 10*3/uL (ref 0.00–0.07)
Basophils Absolute: 0 10*3/uL (ref 0.0–0.1)
Basophils Relative: 0 %
Eosinophils Absolute: 0 10*3/uL (ref 0.0–0.5)
Eosinophils Relative: 0 %
HCT: 38.6 % (ref 36.0–46.0)
Hemoglobin: 13.4 g/dL (ref 12.0–15.0)
Immature Granulocytes: 0 %
Lymphocytes Relative: 16 %
Lymphs Abs: 1.1 10*3/uL (ref 0.7–4.0)
MCH: 32.7 pg (ref 26.0–34.0)
MCHC: 34.7 g/dL (ref 30.0–36.0)
MCV: 94.1 fL (ref 80.0–100.0)
Monocytes Absolute: 0.6 10*3/uL (ref 0.1–1.0)
Monocytes Relative: 8 %
Neutro Abs: 5.3 10*3/uL (ref 1.7–7.7)
Neutrophils Relative %: 76 %
Platelets: 229 10*3/uL (ref 150–400)
RBC: 4.1 MIL/uL (ref 3.87–5.11)
RDW: 13 % (ref 11.5–15.5)
WBC: 7 10*3/uL (ref 4.0–10.5)
nRBC: 0 % (ref 0.0–0.2)

## 2022-06-05 SURGERY — FIXATION, FEMUR, NECK, PERCUTANEOUS, USING SCREW
Anesthesia: General | Site: Hip | Laterality: Left

## 2022-06-05 MED ORDER — DEXAMETHASONE SODIUM PHOSPHATE 10 MG/ML IJ SOLN
8.0000 mg | Freq: Once | INTRAMUSCULAR | Status: AC
  Administered 2022-06-05: 10 mg via INTRAVENOUS

## 2022-06-05 MED ORDER — ONDANSETRON HCL 4 MG/2ML IJ SOLN
INTRAMUSCULAR | Status: AC
  Filled 2022-06-05: qty 2

## 2022-06-05 MED ORDER — TRANEXAMIC ACID-NACL 1000-0.7 MG/100ML-% IV SOLN
1000.0000 mg | Freq: Once | INTRAVENOUS | Status: AC
  Administered 2022-06-05: 1000 mg via INTRAVENOUS
  Filled 2022-06-05: qty 100

## 2022-06-05 MED ORDER — TRANEXAMIC ACID-NACL 1000-0.7 MG/100ML-% IV SOLN
INTRAVENOUS | Status: AC
  Filled 2022-06-05: qty 100

## 2022-06-05 MED ORDER — CEFAZOLIN SODIUM-DEXTROSE 2-4 GM/100ML-% IV SOLN
2.0000 g | INTRAVENOUS | Status: AC
  Administered 2022-06-05: 2 g via INTRAVENOUS

## 2022-06-05 MED ORDER — ACETAMINOPHEN 325 MG PO TABS: 325.0000 mg | ORAL_TABLET | Freq: Four times a day (QID) | ORAL | Status: DC | PRN

## 2022-06-05 MED ORDER — MIDAZOLAM HCL 2 MG/2ML IJ SOLN
INTRAMUSCULAR | Status: AC
  Filled 2022-06-05: qty 2

## 2022-06-05 MED ORDER — DOCUSATE SODIUM 100 MG PO CAPS
100.0000 mg | ORAL_CAPSULE | Freq: Two times a day (BID) | ORAL | Status: DC
  Administered 2022-06-05 – 2022-06-07 (×4): 100 mg via ORAL
  Filled 2022-06-05 (×4): qty 1

## 2022-06-05 MED ORDER — POVIDONE-IODINE 10 % EX SWAB: 2.0000 | Freq: Once | CUTANEOUS | Status: DC

## 2022-06-05 MED ORDER — CHLORHEXIDINE GLUCONATE 0.12 % MT SOLN: 15.0000 mL | Freq: Once | OROMUCOSAL | Status: DC

## 2022-06-05 MED ORDER — OXYCODONE HCL 5 MG PO TABS: 5.0000 mg | ORAL_TABLET | Freq: Once | ORAL | Status: DC | PRN

## 2022-06-05 MED ORDER — HYDROMORPHONE HCL 1 MG/ML IJ SOLN: 0.2500 mg | INTRAMUSCULAR | Status: DC | PRN

## 2022-06-05 MED ORDER — FENTANYL CITRATE (PF) 250 MCG/5ML IJ SOLN
INTRAMUSCULAR | Status: DC | PRN
  Administered 2022-06-05 (×3): 50 ug via INTRAVENOUS
  Administered 2022-06-05: 100 ug via INTRAVENOUS

## 2022-06-05 MED ORDER — PHENYLEPHRINE 80 MCG/ML (10ML) SYRINGE FOR IV PUSH (FOR BLOOD PRESSURE SUPPORT)
PREFILLED_SYRINGE | INTRAVENOUS | Status: AC
  Filled 2022-06-05: qty 10

## 2022-06-05 MED ORDER — BISACODYL 10 MG RE SUPP: 10.0000 mg | Freq: Every day | RECTAL | Status: DC | PRN

## 2022-06-05 MED ORDER — DEXMEDETOMIDINE HCL IN NACL 80 MCG/20ML IV SOLN
INTRAVENOUS | Status: AC
  Filled 2022-06-05: qty 20

## 2022-06-05 MED ORDER — FERROUS SULFATE 325 (65 FE) MG PO TABS
325.0000 mg | ORAL_TABLET | Freq: Three times a day (TID) | ORAL | Status: DC
  Administered 2022-06-06 – 2022-06-07 (×4): 325 mg via ORAL
  Filled 2022-06-05 (×4): qty 1

## 2022-06-05 MED ORDER — CEFAZOLIN SODIUM-DEXTROSE 2-4 GM/100ML-% IV SOLN
INTRAVENOUS | Status: AC
  Filled 2022-06-05: qty 100

## 2022-06-05 MED ORDER — TRANEXAMIC ACID-NACL 1000-0.7 MG/100ML-% IV SOLN
1000.0000 mg | INTRAVENOUS | Status: AC
  Administered 2022-06-05: 1000 mg via INTRAVENOUS

## 2022-06-05 MED ORDER — MORPHINE SULFATE (PF) 2 MG/ML IV SOLN
0.5000 mg | INTRAVENOUS | Status: DC | PRN
  Administered 2022-06-05 – 2022-06-06 (×3): 1 mg via INTRAVENOUS
  Filled 2022-06-05 (×3): qty 1

## 2022-06-05 MED ORDER — LACTATED RINGERS IV SOLN: INTRAVENOUS | Status: DC

## 2022-06-05 MED ORDER — OXYCODONE HCL 5 MG/5ML PO SOLN: 5.0000 mg | Freq: Once | ORAL | Status: DC | PRN

## 2022-06-05 MED ORDER — CHLORHEXIDINE GLUCONATE 4 % EX LIQD: 60.0000 mL | Freq: Once | CUTANEOUS | Status: DC

## 2022-06-05 MED ORDER — METOCLOPRAMIDE HCL 5 MG PO TABS: 5.0000 mg | ORAL_TABLET | Freq: Three times a day (TID) | ORAL | Status: DC | PRN

## 2022-06-05 MED ORDER — ROCURONIUM BROMIDE 10 MG/ML (PF) SYRINGE
PREFILLED_SYRINGE | INTRAVENOUS | Status: DC | PRN
  Administered 2022-06-05: 20 mg via INTRAVENOUS
  Administered 2022-06-05: 50 mg via INTRAVENOUS

## 2022-06-05 MED ORDER — ASPIRIN 81 MG PO CHEW
81.0000 mg | CHEWABLE_TABLET | Freq: Two times a day (BID) | ORAL | Status: DC
  Administered 2022-06-05 – 2022-06-07 (×4): 81 mg via ORAL
  Filled 2022-06-05 (×4): qty 1

## 2022-06-05 MED ORDER — PROPOFOL 10 MG/ML IV BOLUS
INTRAVENOUS | Status: DC | PRN
  Administered 2022-06-05: 120 mg via INTRAVENOUS

## 2022-06-05 MED ORDER — PROPOFOL 10 MG/ML IV BOLUS
INTRAVENOUS | Status: AC
  Filled 2022-06-05: qty 20

## 2022-06-05 MED ORDER — MENTHOL 3 MG MT LOZG: 1.0000 | LOZENGE | OROMUCOSAL | Status: DC | PRN

## 2022-06-05 MED ORDER — ROCURONIUM BROMIDE 10 MG/ML (PF) SYRINGE
PREFILLED_SYRINGE | INTRAVENOUS | Status: AC
  Filled 2022-06-05: qty 10

## 2022-06-05 MED ORDER — DEXAMETHASONE SODIUM PHOSPHATE 10 MG/ML IJ SOLN
INTRAMUSCULAR | Status: AC
  Filled 2022-06-05: qty 1

## 2022-06-05 MED ORDER — MIDAZOLAM HCL 2 MG/2ML IJ SOLN
INTRAMUSCULAR | Status: DC | PRN
  Administered 2022-06-05: 2 mg via INTRAVENOUS

## 2022-06-05 MED ORDER — DEXMEDETOMIDINE HCL IN NACL 80 MCG/20ML IV SOLN
INTRAVENOUS | Status: DC | PRN
  Administered 2022-06-05: 8 ug via BUCCAL
  Administered 2022-06-05: 4 ug via BUCCAL
  Administered 2022-06-05: 8 ug via BUCCAL

## 2022-06-05 MED ORDER — ORAL CARE MOUTH RINSE: 15.0000 mL | Freq: Once | OROMUCOSAL | Status: DC

## 2022-06-05 MED ORDER — HYDROCODONE-ACETAMINOPHEN 5-325 MG PO TABS
1.0000 | ORAL_TABLET | ORAL | Status: DC | PRN
  Administered 2022-06-05 – 2022-06-07 (×7): 2 via ORAL
  Filled 2022-06-05 (×7): qty 2

## 2022-06-05 MED ORDER — LIDOCAINE 2% (20 MG/ML) 5 ML SYRINGE
INTRAMUSCULAR | Status: DC | PRN
  Administered 2022-06-05: 40 mg via INTRAVENOUS

## 2022-06-05 MED ORDER — MEPERIDINE HCL 25 MG/ML IJ SOLN: 6.2500 mg | INTRAMUSCULAR | Status: DC | PRN

## 2022-06-05 MED ORDER — POLYETHYLENE GLYCOL 3350 17 G PO PACK
17.0000 g | PACK | Freq: Every day | ORAL | Status: DC
  Administered 2022-06-05 – 2022-06-07 (×3): 17 g via ORAL
  Filled 2022-06-05 (×3): qty 1

## 2022-06-05 MED ORDER — SENNA 8.6 MG PO TABS
2.0000 | ORAL_TABLET | Freq: Every day | ORAL | Status: DC
  Administered 2022-06-05 – 2022-06-06 (×2): 17.2 mg via ORAL
  Filled 2022-06-05 (×2): qty 2

## 2022-06-05 MED ORDER — PHENOL 1.4 % MT LIQD: 1.0000 | OROMUCOSAL | Status: DC | PRN

## 2022-06-05 MED ORDER — AMISULPRIDE (ANTIEMETIC) 5 MG/2ML IV SOLN: 10.0000 mg | Freq: Once | INTRAVENOUS | Status: DC | PRN

## 2022-06-05 MED ORDER — SODIUM CHLORIDE 0.9 % IV SOLN: INTRAVENOUS | Status: DC

## 2022-06-05 MED ORDER — CEFAZOLIN SODIUM-DEXTROSE 2-4 GM/100ML-% IV SOLN
2.0000 g | Freq: Four times a day (QID) | INTRAVENOUS | Status: AC
  Administered 2022-06-05 – 2022-06-06 (×2): 2 g via INTRAVENOUS
  Filled 2022-06-05 (×2): qty 100

## 2022-06-05 MED ORDER — ONDANSETRON HCL 4 MG/2ML IJ SOLN
INTRAMUSCULAR | Status: DC | PRN
  Administered 2022-06-05: 4 mg via INTRAVENOUS

## 2022-06-05 MED ORDER — DEXAMETHASONE SODIUM PHOSPHATE 10 MG/ML IJ SOLN
10.0000 mg | Freq: Once | INTRAMUSCULAR | Status: AC
  Administered 2022-06-06: 10 mg via INTRAVENOUS
  Filled 2022-06-05: qty 1

## 2022-06-05 MED ORDER — PROMETHAZINE HCL 25 MG/ML IJ SOLN: 6.2500 mg | INTRAMUSCULAR | Status: DC | PRN

## 2022-06-05 MED ORDER — MORPHINE SULFATE (PF) 2 MG/ML IV SOLN: 0.5000 mg | INTRAVENOUS | Status: DC | PRN

## 2022-06-05 MED ORDER — ONDANSETRON HCL 4 MG PO TABS: 4.0000 mg | ORAL_TABLET | Freq: Four times a day (QID) | ORAL | Status: DC | PRN

## 2022-06-05 MED ORDER — ONDANSETRON HCL 4 MG/2ML IJ SOLN: 4.0000 mg | Freq: Four times a day (QID) | INTRAMUSCULAR | Status: DC | PRN

## 2022-06-05 MED ORDER — METOCLOPRAMIDE HCL 5 MG/ML IJ SOLN: 5.0000 mg | Freq: Three times a day (TID) | INTRAMUSCULAR | Status: DC | PRN

## 2022-06-05 MED ORDER — FENTANYL CITRATE (PF) 250 MCG/5ML IJ SOLN
INTRAMUSCULAR | Status: AC
  Filled 2022-06-05: qty 5

## 2022-06-05 MED ORDER — SUGAMMADEX SODIUM 200 MG/2ML IV SOLN
INTRAVENOUS | Status: DC | PRN
  Administered 2022-06-05: 97 mg via INTRAVENOUS

## 2022-06-05 MED ORDER — LIDOCAINE 2% (20 MG/ML) 5 ML SYRINGE
INTRAMUSCULAR | Status: AC
  Filled 2022-06-05: qty 5

## 2022-06-05 SURGICAL SUPPLY — 44 items
ADH SKN CLS LQ APL DERMABOND (GAUZE/BANDAGES/DRESSINGS) ×1
BAG COUNTER SPONGE SURGICOUNT (BAG) ×2 IMPLANT
BAG SPNG CNTER NS LX DISP (BAG) ×1
BIT DRILL 4.8X200 CANN (BIT) IMPLANT
COVER PERINEAL POST (MISCELLANEOUS) ×2 IMPLANT
COVER SURGICAL LIGHT HANDLE (MISCELLANEOUS) ×2 IMPLANT
DERMABOND ADVANCED .7 DNX6 (GAUZE/BANDAGES/DRESSINGS) IMPLANT
DRAPE STERI IOBAN 125X83 (DRAPES) ×2 IMPLANT
DRAPE SURG 17X23 STRL (DRAPES) ×4 IMPLANT
DRESSING AQUACEL AG SP 3.5X6 (GAUZE/BANDAGES/DRESSINGS) IMPLANT
DRSG ADAPTIC 3X8 NADH LF (GAUZE/BANDAGES/DRESSINGS) ×2 IMPLANT
DRSG AQUACEL AG SP 3.5X6 (GAUZE/BANDAGES/DRESSINGS) ×1
DRSG MEPILEX BORDER 4X4 (GAUZE/BANDAGES/DRESSINGS) ×2 IMPLANT
DURAPREP 26ML APPLICATOR (WOUND CARE) ×2 IMPLANT
ELECT REM PT RETURN 9FT ADLT (ELECTROSURGICAL) ×1
ELECTRODE REM PT RTRN 9FT ADLT (ELECTROSURGICAL) ×2 IMPLANT
FACESHIELD WRAPAROUND (MASK) ×1 IMPLANT
FACESHIELD WRAPAROUND OR TEAM (MASK) ×2 IMPLANT
GLOVE BIOGEL PI IND STRL 7.5 (GLOVE) ×6 IMPLANT
GLOVE BIOGEL PI IND STRL 8 (GLOVE) ×2 IMPLANT
GLOVE ORTHO TXT STRL SZ7.5 (GLOVE) ×4 IMPLANT
GLOVE SURG ENC MOIS LTX SZ6 (GLOVE) ×2 IMPLANT
GLOVE SURG ORTHO 8.0 STRL STRW (GLOVE) ×2 IMPLANT
GOWN STRL REUS W/ TWL LRG LVL3 (GOWN DISPOSABLE) ×6 IMPLANT
GOWN STRL REUS W/TWL LRG LVL3 (GOWN DISPOSABLE) ×3
GUIDE PIN DRILL TIP 2.8X450HIP (PIN) ×3
KIT BASIN OR (CUSTOM PROCEDURE TRAY) ×2 IMPLANT
KIT TURNOVER KIT B (KITS) ×2 IMPLANT
MANIFOLD NEPTUNE II (INSTRUMENTS) ×2 IMPLANT
NS IRRIG 1000ML POUR BTL (IV SOLUTION) ×2 IMPLANT
PACK GENERAL/GYN (CUSTOM PROCEDURE TRAY) ×2 IMPLANT
PAD ARMBOARD 7.5X6 YLW CONV (MISCELLANEOUS) ×4 IMPLANT
PIN GUIDE DRILL TIP 2.8X450HIP (PIN) IMPLANT
SCREW 8.0X80MMX16 (Screw) IMPLANT
SCREW CANN 8.0X85 HIP (Screw) IMPLANT
SCREW PARTIAL THREAD 8.0X90MM (Screw) IMPLANT
STAPLER VISISTAT 35W (STAPLE) ×2 IMPLANT
SUT VIC AB 0 CT1 27 (SUTURE) ×1
SUT VIC AB 0 CT1 27XBRD ANBCTR (SUTURE) ×2 IMPLANT
SUT VIC AB 2-0 CT1 27 (SUTURE) ×1
SUT VIC AB 2-0 CT1 TAPERPNT 27 (SUTURE) ×2 IMPLANT
TOWEL GREEN STERILE (TOWEL DISPOSABLE) ×2 IMPLANT
TOWEL GREEN STERILE FF (TOWEL DISPOSABLE) ×2 IMPLANT
WATER STERILE IRR 1000ML POUR (IV SOLUTION) ×2 IMPLANT

## 2022-06-05 NOTE — Brief Op Note (Signed)
06/05/2022  6:19 PM  PATIENT:  Felicia Perkins  46 y.o. female  PRE-OPERATIVE DIAGNOSIS:  non displaced left femoral neck fracture  POST-OPERATIVE DIAGNOSIS:  non displaced left femoral neck fracture  PROCEDURE:  Procedure(s): PERCUTANEOUS FIXATION OF FEMORAL NECK (Left)  SURGEON:  Surgeon(s) and Role:    Paralee Cancel, MD - Primary  PHYSICIAN ASSISTANT: Costella Hatcher, PA-C   ANESTHESIA:   general  EBL:  50 mL   BLOOD ADMINISTERED:none  DRAINS: none   LOCAL MEDICATIONS USED:  NONE  SPECIMEN:  No Specimen  DISPOSITION OF SPECIMEN:  N/A  COUNTS:  YES  TOURNIQUET:  * No tourniquets in log *  DICTATION: .Other Dictation: Dictation Number [id number]  PLAN OF CARE: Admit to inpatient   PATIENT DISPOSITION:  PACU - hemodynamically stable.   Delay start of Pharmacological VTE agent (>24hrs) due to surgical blood loss or risk of bleeding: no

## 2022-06-05 NOTE — Anesthesia Preprocedure Evaluation (Signed)
Anesthesia Evaluation  Patient identified by MRN, date of birth, ID band Patient awake    Reviewed: Allergy & Precautions, H&P , NPO status , Patient's Chart, lab work & pertinent test results  Airway Mallampati: I  TM Distance: >3 FB Neck ROM: Full    Dental no notable dental hx. (+) Teeth Intact, Dental Advisory Given, Poor Dentition, Chipped   Pulmonary neg pulmonary ROS, COPD, Current Smoker and Patient abstained from smoking.,    Pulmonary exam normal breath sounds clear to auscultation       Cardiovascular Exercise Tolerance: Good hypertension, + CAD  Normal cardiovascular exam Rhythm:Regular Rate:Normal     Neuro/Psych PSYCHIATRIC DISORDERS Anxiety Depression Bipolar Disorder    GI/Hepatic GERD  Medicated and Controlled,(+) Cirrhosis     substance abuse  alcohol use,   Endo/Other    Renal/GU   negative genitourinary   Musculoskeletal   Abdominal   Peds negative pediatric ROS (+)  Hematology   Anesthesia Other Findings   Reproductive/Obstetrics                             Anesthesia Physical  Anesthesia Plan  ASA: 3  Anesthesia Plan: General   Post-op Pain Management: Tylenol PO (pre-op)* and Dilaudid IV   Induction: Intravenous  PONV Risk Score and Plan: 3 and Ondansetron, Dexamethasone, Treatment may vary due to age or medical condition and Midazolam  Airway Management Planned: Oral ETT  Additional Equipment: None  Intra-op Plan:   Post-operative Plan: Extubation in OR  Informed Consent: I have reviewed the patients History and Physical, chart, labs and discussed the procedure including the risks, benefits and alternatives for the proposed anesthesia with the patient or authorized representative who has indicated his/her understanding and acceptance.       Plan Discussed with: Anesthesiologist and CRNA  Anesthesia Plan Comments: (PAT note by Karoline Caldwell,  PA-C: Recent cardiology evaluation for report of chest discomfort. Testing was reassuring. Cardiology clearance per telephone encounter 01/11/21, "Chart reviewed as part of pre-operative protocol coverage.Cardiac CTA 12/14/20 with coronary calcium score of 0. Echocardiogram 12/2020 LVEF 70-75%, no RWMA, normal diastolic parameters, no significant valvular abnormalities. Given past medical history and time since last visit, based on ACC/AHA guidelines,Rosamae M Carterwould be at acceptable risk for the planned procedure without further cardiovascular testing. The patient was advised that ifshedevelops new symptoms prior to surgery to contact our office to arrange for a follow-up visit, and sheverbalized understanding."  Preop labs reviewed, mild transaminates which is not new, otherwise unremarkable.   EKG 12/01/20: Sinus rhythm with short PR. Septal infarct, age undetermined. Rate 84.  CHEST - 2 VIEW 08/15/20: COMPARISON: February 28, 2020  FINDINGS: Lungs are clear. Heart size and pulmonary vascularity are normal. No adenopathy. No bone lesions.  IMPRESSION: Lungs clear. Cardiac silhouette normal.  TTE 01/03/21: 1. Left ventricular ejection fraction, by estimation, is 70 to 75%. The  left ventricle has hyperdynamic function. The left ventricle has no  regional wall motion abnormalities. Left ventricular diastolic parameters  were normal.  2. Right ventricular systolic function is normal. The right ventricular  size is normal.  3. The mitral valve is normal in structure. No evidence of mitral valve  regurgitation.  4. The aortic valve is normal in structure. Aortic valve regurgitation is  not visualized.   Comparison(s): The left ventricular function is unchanged.   Coronary CTA 12/12/20: IMPRESSION: 1. Calcium score 0  2. Normal right dominant coronary arteries  3. Normal aortic root 2.9 cm  Carotid duplex 11/18/20: Summary:  Right Carotid: Velocities in the right ICA are  consistent with a 80-99% stenosis.   Left Carotid: Velocities in the left ICA are consistent with a 40-59% stenosis.   Vertebrals: Right vertebral artery demonstrates antegrade flow. Left vertebral artery was not visualized.    )        Anesthesia Quick Evaluation

## 2022-06-05 NOTE — Interval H&P Note (Signed)
History and Physical Interval Note:  06/05/2022 4:57 PM  Felicia Perkins  has presented today for surgery, with the diagnosis of Left Hip fx.  The various methods of treatment have been discussed with the patient and family. After consideration of risks, benefits and other options for treatment, the patient has consented to  Procedure(s): PERCUTANEOUS FIXATION OF FEMORAL NECK (Left) as a surgical intervention.  The patient's history has been reviewed, patient examined, no change in status, stable for surgery.  I have reviewed the patient's chart and labs.  Questions were answered to the patient's satisfaction.     Mauri Pole

## 2022-06-05 NOTE — Anesthesia Procedure Notes (Signed)
Procedure Name: Intubation Date/Time: 06/05/2022 5:09 PM  Performed by: Lorie Phenix, CRNAPre-anesthesia Checklist: Patient identified, Emergency Drugs available, Suction available and Patient being monitored Patient Re-evaluated:Patient Re-evaluated prior to induction Oxygen Delivery Method: Circle system utilized Preoxygenation: Pre-oxygenation with 100% oxygen Induction Type: IV induction Ventilation: Mask ventilation without difficulty Laryngoscope Size: Mac and 3 Grade View: Grade I Tube type: Oral Tube size: 7.0 mm Number of attempts: 1 Airway Equipment and Method: Stylet Placement Confirmation: ETT inserted through vocal cords under direct vision, positive ETCO2 and breath sounds checked- equal and bilateral Secured at: 22 cm Tube secured with: Tape Dental Injury: Teeth and Oropharynx as per pre-operative assessment

## 2022-06-05 NOTE — Progress Notes (Signed)
PROGRESS NOTE    Felicia Perkins  ZHY:[id number] DOB: [birth year] DOA: 06/03/2022 PCP: Felicia Rakes, MD   Brief Narrative:  Felicia Perkins is a 46 y.o. female with medical history significant of high-grade right carotid stenosis status post enterectomy on Plavix, HTN, COPD, alcohol abuse, anxiety/depression, presented with fall and left hip pain and was diagnosed with cute subcapital fracture of left femoral neck with minimal impact, admitted to hospital service, orthopedics consulted.  Assessment & Plan:   Active Problems:   COPD (chronic obstructive pulmonary disease) (HCC)   Tobacco abuse counseling   Anxiety   Fracture of femoral neck, left (HCC)   Malnutrition of moderate degree  Left femoral neck fracture: Secondary to fall.  Ortho on board.  Surgery posted for today in order to allow some time for Plavix washout.  COPD: Currently asymptomatic.  No wheezes on exam.  Continue current maintaining breathing treatments.  History of high-grade right-sided carotid stenosis s/p endarterectomy: Plavix on hold due to pending surgery.  History of alcohol dependence/abuse: No signs of withdrawal.  Continue CIWA protocol.  Tobacco abuse/dependence: Smoking cessation counseling provided.  Moderate protein calorie malnutrition: BMI only 18.  Nutrition consulted.  Hypertension: Controlled.  Continue midodrine.  Hyperlipidemia: Continue Crestor.  GERD: Continue PPI.  DVT prophylaxis: Place and maintain sequential compression device Start: 06/03/22 1412 resume Lovenox tomorrow, postoperatively.   Code Status: Full Code  Family Communication:  None present at bedside.  Plan of care discussed with patient in length and he/she verbalized understanding and agreed with it.  Status is: Inpatient Remains inpatient appropriate because: Patient scheduled for hip repair surgery today.   Estimated body mass index is 18.37 kg/m as calculated from the following:   Height as of this  encounter: '5\' 4"'$  (1.626 m).   Weight as of this encounter: 48.5 kg.    Nutritional Assessment: Body mass index is 18.37 kg/m.Marland Kitchen Seen by dietician.  I agree with the assessment and plan as outlined below: Nutrition Status: Nutrition Problem: Moderate Malnutrition Etiology: chronic illness (alcohol use, carotid stenosis, anxiety/depression) Signs/Symptoms: mild fat depletion, moderate fat depletion, severe muscle depletion, moderate muscle depletion Interventions: Ensure Enlive (each supplement provides 350kcal and 20 grams of protein), MVI  . Skin Assessment: I have examined the patient's skin and I agree with the wound assessment as performed by the wound care RN as outlined below:    Consultants:  Orthopedics  Procedures:  None  Antimicrobials:  Anti-infectives (From admission, onward)    None         Subjective:  Patient seen and examined.  She has no complaints.  She is looking forward to the surgical repair.  Objective: Vitals:   06/05/22 0412 06/05/22 0742 06/05/22 0843 06/05/22 0932  BP: 102/75 (!) 142/88  128/89  Pulse: 69 81 82 80  Resp: '16 18 17 16  '$ Temp:  97.8 F (36.6 C)  98.6 F (37 C)  TempSrc:    Oral  SpO2: 100% 100% 100% 100%  Weight:      Height:        Intake/Output Summary (Last 24 hours) at 06/05/2022 1140 Last data filed at 06/05/2022 0750 Gross per 24 hour  Intake 120 ml  Output 2000 ml  Net -1880 ml   Filed Weights   06/03/22 0906  Weight: 48.5 kg    Examination:  General exam: Appears calm and comfortable  Respiratory system: Clear to auscultation. Respiratory effort normal. Cardiovascular system: S1 & S2 heard, RRR. No JVD, murmurs, rubs, gallops  or clicks. No pedal edema. Gastrointestinal system: Abdomen is nondistended, soft and nontender. No organomegaly or masses felt. Normal bowel sounds heard. Central nervous system: Alert and oriented. No focal neurological deficits.  Data Reviewed: I have personally reviewed  following labs and imaging studies  CBC: Recent Labs  Lab 06/03/22 1004 06/05/22 0347  WBC 9.7 7.0  NEUTROABS  --  5.3  HGB 13.4 13.4  HCT 39.9 38.6  MCV 96.1 94.1  PLT 240 585    Basic Metabolic Panel: Recent Labs  Lab 06/03/22 1004  NA 134*  K 3.8  CL 98  CO2 20*  GLUCOSE 106*  BUN <5*  CREATININE 0.56  CALCIUM 9.0    GFR: Estimated Creatinine Clearance: 66.6 mL/min (by C-G formula based on SCr of 0.56 mg/dL). Liver Function Tests: Recent Labs  Lab 06/03/22 1004  AST 28  ALT 20  ALKPHOS 92  BILITOT 0.5  PROT 6.8  ALBUMIN 3.9    No results for input(s): "LIPASE", "AMYLASE" in the last 168 hours. No results for input(s): "AMMONIA" in the last 168 hours. Coagulation Profile: No results for input(s): "INR", "PROTIME" in the last 168 hours. Cardiac Enzymes: No results for input(s): "CKTOTAL", "CKMB", "CKMBINDEX", "TROPONINI" in the last 168 hours. BNP (last 3 results) No results for input(s): "PROBNP" in the last 8760 hours. HbA1C: No results for input(s): "HGBA1C" in the last 72 hours. CBG: No results for input(s): "GLUCAP" in the last 168 hours. Lipid Profile: No results for input(s): "CHOL", "HDL", "LDLCALC", "TRIG", "CHOLHDL", "LDLDIRECT" in the last 72 hours. Thyroid Function Tests: No results for input(s): "TSH", "T4TOTAL", "FREET4", "T3FREE", "THYROIDAB" in the last 72 hours. Anemia Panel: No results for input(s): "VITAMINB12", "FOLATE", "FERRITIN", "TIBC", "IRON", "RETICCTPCT" in the last 72 hours. Sepsis Labs: No results for input(s): "PROCALCITON", "LATICACIDVEN" in the last 168 hours.  Recent Results (from the past 240 hour(s))  Surgical PCR screen     Status: None   Collection Time: 06/04/22  5:54 AM   Specimen: Nasal Mucosa; Nasal Swab  Result Value Ref Range Status   MRSA, PCR NEGATIVE NEGATIVE Final   Staphylococcus aureus NEGATIVE NEGATIVE Final    Comment: (NOTE) The Xpert SA Assay (FDA approved for NASAL specimens in patients  39 years of age and older), is one component of a comprehensive surveillance program. It is not intended to diagnose infection nor to guide or monitor treatment. Performed at [facility], [place] [address] [place], [place] [address]      Radiology Studies: CT Hip Left Wo Contrast  Result Date: 06/03/2022 CLINICAL DATA:  Left hip fracture after fall EXAM: CT OF THE LEFT HIP WITHOUT CONTRAST TECHNIQUE: Multidetector CT imaging of the left hip was performed according to the standard protocol. Multiplanar CT image reconstructions were also generated. RADIATION DOSE REDUCTION: This exam was performed according to the departmental dose-optimization program which includes automated exposure control, adjustment of the mA and/or kV according to patient size and/or use of iterative reconstruction technique. COMPARISON:  Same-day x-ray, CT 10/24/2020 FINDINGS: Bones/Joint/Cartilage Acute subcapital fracture of the left femoral neck with minimal impaction. No significant displacement or angulation. No fracture involvement of the femoral head or intertrochanteric region. Hip joint alignment is maintained without dislocation. Intact bony pelvis without fracture or diastasis. Small sclerotic lesion within the posterior left ilium is stable from prior CT (series 5, image 18), likely benign fibro-osseous lesion. No suspicious lytic or sclerotic bone lesion. Bilateral hip joint spaces are preserved. Moderate-sized left hip joint lipohemarthrosis. Ligaments  Suboptimally assessed by CT. Muscles and Tendons No acute musculotendinous findings by CT. Soft tissues Negative for hematoma within the soft tissues. No inguinal or intrapelvic lymphadenopathy. Atherosclerotic vascular calcifications are present. IMPRESSION: 1. Acute subcapital fracture of the left femoral neck with minimal impaction. 2. Moderate-sized left hip joint lipohemarthrosis. Electronically Signed   By: Davina Poke D.O.   On: 06/03/2022 13:01   CT  Head Wo Contrast  Result Date: 06/03/2022 CLINICAL DATA:  Head trauma, moderate to severe. EXAM: CT HEAD WITHOUT CONTRAST TECHNIQUE: Contiguous axial images were obtained from the base of the skull through the vertex without intravenous contrast. RADIATION DOSE REDUCTION: This exam was performed according to the departmental dose-optimization program which includes automated exposure control, adjustment of the mA and/or kV according to patient size and/or use of iterative reconstruction technique. COMPARISON:  None Available. FINDINGS: Brain: No hydrocephalus. No mass, hemorrhage, edema or other evidence of acute parenchymal abnormality. No extra-axial hemorrhage. Vascular: No hyperdense vessel or unexpected calcification. Skull: Normal. Negative for fracture or focal lesion. Sinuses/Orbits: No acute finding. Other: None. IMPRESSION: Negative head CT. No intracranial hemorrhage or skull fracture. Electronically Signed   By: Franki Cabot M.D.   On: 06/03/2022 12:51    Scheduled Meds:  amLODipine  2.5 mg Oral Daily   aspirin EC  81 mg Oral Daily   famotidine  20 mg Oral Daily   feeding supplement  237 mL Oral BID BM   fluticasone  2 spray Each Nare Daily   folic acid  1 mg Oral Daily   loratadine  10 mg Oral Daily   mometasone-formoterol  2 puff Inhalation BID   multivitamin with minerals  1 tablet Oral Daily   mupirocin ointment  1 Application Nasal BID   nicotine  21 mg Transdermal Daily   olopatadine  1 drop Both Eyes BID   pantoprazole  40 mg Oral Daily   rosuvastatin  10 mg Oral Daily   thiamine  100 mg Oral Daily   Vitamin D (Ergocalciferol)  50,000 Units Oral Q7 days   Continuous Infusions:   LOS: 2 days   Darliss Cheney, MD [facility]  06/05/2022, 11:40 AM   *Please note that this is a verbal dictation therefore any spelling or grammatical errors are due to the "[place]" system interpretation.  Please page via [place] and do not message via secure chat for  urgent patient care matters. Secure chat can be used for non urgent patient care matters.  How to contact the [facility] Attending or Consulting provider Uriah or covering provider during after hours [place], for this patient?  Check the care team in [facility] and look for a) attending/consulting [facility] provider listed and b) the [facility] team listed. Page or secure chat [phone]. Log into [email or web address] and use [place]'s universal password to access. If you do not have the password, please contact the hospital operator. Locate the [facility] provider you are looking for under [facility] and page to a number that you can be directly reached. If you still have difficulty reaching the provider, please page the [facility] (Director on Call) for the Hospitalists listed on amion for assistance.

## 2022-06-05 NOTE — Progress Notes (Signed)
Patient ID: Felicia Perkins, female   DOB: 1976-06-20, 46 y.o.   MRN: [id number]  Relatively comfortable  No new complaints  Left hip femoral neck fracture  To OR today for percutaneous screw fixation for hip fracture Risks of malunion, non union and need for potential future surgery reviewed  Consent signed for benefit of fracture management and pan

## 2022-06-05 NOTE — Plan of Care (Signed)

## 2022-06-05 NOTE — Transfer of Care (Signed)
Immediate Anesthesia Transfer of Care Note  Patient: [name]  Procedure(s) Performed: PERCUTANEOUS FIXATION OF FEMORAL NECK (Left: Hip)  Patient Location: PACU  Anesthesia Type:General  Level of Consciousness: awake and alert   Airway & Oxygen Therapy: Patient Spontanous Breathing  Post-op Assessment: Report given to RN and Post -op Vital signs reviewed and stable  Post vital signs: Reviewed and stable  Last Vitals:  Vitals Value Taken Time  BP 137/102 06/05/22 1830  Temp    Pulse 96 06/05/22 1832  Resp 18 06/05/22 1832  SpO2 99 % 06/05/22 1832  Vitals shown include unvalidated device data.  Last Pain:  Vitals:   06/05/22 1531  TempSrc:   PainSc: 4       Patients Stated Pain Goal: 0 (21/03/12 8118)  Complications: No notable events documented.

## 2022-06-05 NOTE — Discharge Instructions (Signed)

## 2022-06-05 NOTE — Anesthesia Postprocedure Evaluation (Signed)
Anesthesia Post Note  Patient: Felicia Perkins  Procedure(s) Performed: PERCUTANEOUS FIXATION OF FEMORAL NECK (Left: Hip)     Patient location during evaluation: PACU Anesthesia Type: General Level of consciousness: awake Pain management: pain level controlled Vital Signs Assessment: post-procedure vital signs reviewed and stable Respiratory status: spontaneous breathing, nonlabored ventilation, respiratory function stable and patient connected to nasal cannula oxygen Cardiovascular status: blood pressure returned to baseline and stable Postop Assessment: no apparent nausea or vomiting Anesthetic complications: no   No notable events documented.  Last Vitals:  Vitals:   06/05/22 1954 06/05/22 2000  BP: (!) 129/90   Pulse: 70   Resp: 18   Temp: 36.8 C   SpO2: 95% 95%    Last Pain:  Vitals:   06/05/22 1954  TempSrc: Oral  PainSc: 8                  Rashi Giuliani P Kymir Coles

## 2022-06-06 ENCOUNTER — Encounter (HOSPITAL_COMMUNITY): Payer: Self-pay | Admitting: Orthopedic Surgery

## 2022-06-06 ENCOUNTER — Other Ambulatory Visit: Payer: Self-pay

## 2022-06-06 DIAGNOSIS — S72002A Fracture of unspecified part of neck of left femur, initial encounter for closed fracture: Secondary | ICD-10-CM | POA: Diagnosis not present

## 2022-06-06 LAB — BASIC METABOLIC PANEL
Anion gap: 17 — ABNORMAL HIGH (ref 5–15)
BUN: 9 mg/dL (ref 6–20)
CO2: 18 mmol/L — ABNORMAL LOW (ref 22–32)
Calcium: 9.4 mg/dL (ref 8.9–10.3)
Chloride: 101 mmol/L (ref 98–111)
Creatinine, Ser: 0.49 mg/dL (ref 0.44–1.00)
GFR, Estimated: >60 mL/min (ref >60)
Glucose, Bld: 112 mg/dL — ABNORMAL HIGH (ref 70–99)
Potassium: 4.6 mmol/L (ref 3.5–5.1)
Sodium: 136 mmol/L (ref 135–145)

## 2022-06-06 LAB — CBC
HCT: 40 % (ref 36.0–46.0)
Hemoglobin: 13.9 g/dL (ref 12.0–15.0)
MCH: 32.7 pg (ref 26.0–34.0)
MCHC: 34.8 g/dL (ref 30.0–36.0)
MCV: 94.1 fL (ref 80.0–100.0)
Platelets: 238 10*3/uL (ref 150–400)
RBC: 4.25 MIL/uL (ref 3.87–5.11)
RDW: 13.3 % (ref 11.5–15.5)
WBC: 11.2 10*3/uL — ABNORMAL HIGH (ref 4.0–10.5)
nRBC: 0 % (ref 0.0–0.2)

## 2022-06-06 MED ORDER — CLOPIDOGREL BISULFATE 75 MG PO TABS
75.0000 mg | ORAL_TABLET | Freq: Every day | ORAL | Status: DC
  Administered 2022-06-06 – 2022-06-07 (×2): 75 mg via ORAL
  Filled 2022-06-06 (×2): qty 1

## 2022-06-06 NOTE — Evaluation (Signed)
Physical Therapy Evaluation  Patient Details Name: Felicia Perkins MRN: [id number] DOB: 1976-05-09 Today's Date: 06/06/2022  History of Present Illness  Pt is a 46 y/o female who presents s/p fall at home, sustaining a femoral neck fracture on 06/03/2022. Pt now s/p percutaneous fixation of L femoral neck fx on 06/05/2022. PMH significant for  pancreatitis, bipolar I disorder, CA, COPD, CAD, PTSD, closed fracture of 5th metacarpal (R) 2019, foot surgery.   Clinical Impression  Pt admitted with above diagnosis. Pt currently with functional limitations due to the deficits listed below (see PT Problem List). At the time of PT eval pt was able to perform transfers with supervision to modified independence and ambulation with min guard assist to supervision for safety. Pt will benefit from skilled PT to increase their independence and safety with mobility to allow discharge to the venue listed below.          Recommendations for follow up therapy are one component of a multi-disciplinary discharge planning process, led by the attending physician.  Recommendations may be updated based on patient status, additional functional criteria and insurance authorization.  Follow Up Recommendations No PT follow up      Assistance Recommended at Discharge Set up Supervision/Assistance  Patient can return home with the following  A little help with walking and/or transfers;A little help with bathing/dressing/bathroom;Assistance with cooking/housework;Assist for transportation;Help with stairs or ramp for entrance    Equipment Recommendations Rolling walker (2 wheels);BSC/3in1  Recommendations for Other Services       Functional Status Assessment Patient has had a recent decline in their functional status and demonstrates the ability to make significant improvements in function in a reasonable and predictable amount of time.     Precautions / Restrictions Precautions Precautions:  Fall Restrictions Weight Bearing Restrictions: Yes LLE Weight Bearing: Partial weight bearing LLE Partial Weight Bearing Percentage or Pounds: 50%      Mobility  Bed Mobility Overal bed mobility: Modified Independent             General bed mobility comments: Increased time but pt was able to transition to EOB without assistance.    Transfers Overall transfer level: Needs assistance Equipment used: Rolling walker (2 wheels) Transfers: Sit to/from Stand Sit to Stand: Supervision           General transfer comment: Light supervision approaching mod I with RW for support.    Ambulation/Gait Ambulation/Gait assistance: Min guard, Supervision Gait Distance (Feet): 50 Feet Assistive device: Rolling walker (2 wheels) Gait Pattern/deviations: Step-to pattern, Step-through pattern, Decreased stride length, Trunk flexed, Narrow base of support Gait velocity: Decreased Gait velocity interpretation: <1.31 ft/sec, indicative of household ambulator   General Gait Details: Pt initially with step-to pattern progressing to a step-through pattern. Appears to be maintaining weight bearing restrictions well with RW.  Stairs            Wheelchair Mobility    Modified Rankin (Stroke Patients Only)       Balance Overall balance assessment: Needs assistance Sitting-balance support: No upper extremity supported, Feet supported Sitting balance-Leahy Scale: Good     Standing balance support: Bilateral upper extremity supported, During functional activity, Reliant on assistive device for balance Standing balance-Leahy Scale: Poor Standing balance comment: Reliant on UE support                             Pertinent Vitals/Pain Pain Assessment Pain Assessment: Faces Faces Pain Scale: Hurts even  more Pain Location: LLE Pain Descriptors / Indicators: Operative site guarding, Grimacing, Sore Pain Intervention(s): Limited activity within patient's tolerance,  Monitored during session, Repositioned    Home Living Family/patient expects to be discharged to:: Private residence Living Arrangements: Spouse/significant other;Children Available Help at Discharge: Family;Available 24 hours/day Type of Home: House Home Access: Level entry       Home Layout: Two level;Able to live on main level with bedroom/bathroom Home Equipment: Crutches      Prior Function Prior Level of Function : Independent/Modified Independent;Working/employed               ADLs Comments: Pt works at [employer]        Extremity/Trunk Assessment   Upper Extremity Assessment Upper Extremity Assessment: Overall WFL for tasks assessed    Lower Extremity Assessment Lower Extremity Assessment: LLE deficits/detail LLE Deficits / Details: Decreased strength and AROM consistent with above mentioned injury and surgical fixation. LLE: Unable to fully assess due to pain    Cervical / Trunk Assessment Cervical / Trunk Assessment: Normal  Communication   Communication: No difficulties  Cognition Arousal/Alertness: Awake/alert Behavior During Therapy: WFL for tasks assessed/performed Overall Cognitive Status: Within Functional Limits for tasks assessed                                          General Comments      Exercises General Exercises - Lower Extremity Long Arc Quad: 20 reps   Assessment/Plan    PT Assessment Patient needs continued PT services  PT Problem List Decreased strength;Decreased range of motion;Decreased activity tolerance;Decreased balance;Decreased mobility;Decreased knowledge of use of DME;Decreased safety awareness;Decreased knowledge of precautions;Pain       PT Treatment Interventions DME instruction;Gait training;Stair training;Functional mobility training;Therapeutic activities;Therapeutic exercise;Balance training;Patient/family education    PT Goals (Current goals can be found in the Care  Plan section)  Acute Rehab PT Goals Patient Stated Goal: Be able to go to her daughter's funeral on Sunday PT Goal Formulation: With patient Time For Goal Achievement: 06/13/22 Potential to Achieve Goals: Good    Frequency Min 4X/week     Co-evaluation               AM-PAC PT "6 Clicks" Mobility  Outcome Measure Help needed turning from your back to your side while in a flat bed without using bedrails?: None Help needed moving from lying on your back to sitting on the side of a flat bed without using bedrails?: None Help needed moving to and from a bed to a chair (including a wheelchair)?: A Little Help needed standing up from a chair using your arms (e.g., wheelchair or bedside chair)?: A Little Help needed to walk in hospital room?: A Little Help needed climbing 3-5 steps with a railing? : A Little 6 Click Score: 20    End of Session Equipment Utilized During Treatment: Gait belt Activity Tolerance: Patient tolerated treatment well Patient left: in chair;with call bell/phone within reach;with chair alarm set Nurse Communication: Mobility status PT Visit Diagnosis: Unsteadiness on feet (R26.81);Pain Pain - Right/Left: Left Pain - part of body: Hip    Time: 8413-2440 PT Time Calculation (min) (ACUTE ONLY): 28 min   Charges:   PT Evaluation $PT Eval Moderate Complexity: 1 Mod PT Treatments $Gait Training: 8-22 mins        Rolinda Roan, PT, DPT Acute Rehabilitation  Services Secure Chat Preferred Office: [phone]   Thelma Comp 06/06/2022, 2:02 PM

## 2022-06-06 NOTE — Plan of Care (Signed)
  Problem: Education: Goal: Knowledge of General Education information will improve Description Including pain rating scale, medication(s)/side effects and non-pharmacologic comfort measures Outcome: Progressing   Problem: Health Behavior/Discharge Planning: Goal: Ability to manage health-related needs will improve Outcome: Progressing   Problem: Clinical Measurements: Goal: Ability to maintain clinical measurements within normal limits will improve Outcome: Progressing Goal: Will remain free from infection Outcome: Progressing Goal: Diagnostic test results will improve Outcome: Progressing Goal: Respiratory complications will improve Outcome: Progressing Goal: Cardiovascular complication will be avoided Outcome: Progressing   Problem: Activity: Goal: Risk for activity intolerance will decrease Outcome: Progressing   Problem: Nutrition: Goal: Adequate nutrition will be maintained Outcome: Progressing   Problem: Coping: Goal: Level of anxiety will decrease Outcome: Progressing   Problem: Elimination: Goal: Will not experience complications related to bowel motility Outcome: Progressing Goal: Will not experience complications related to urinary retention Outcome: Progressing   Problem: Pain Managment: Goal: General experience of comfort will improve Outcome: Progressing   Problem: Safety: Goal: Ability to remain free from injury will improve Outcome: Progressing   Problem: Skin Integrity: Goal: Risk for impaired skin integrity will decrease Outcome: Progressing   Problem: Education: Goal: Knowledge of the prescribed therapeutic regimen will improve Outcome: Progressing Goal: Understanding of discharge needs will improve Outcome: Progressing Goal: Individualized Educational Video(s) Outcome: Progressing   Problem: Activity: Goal: Ability to avoid complications of mobility impairment will improve Outcome: Progressing Goal: Ability to tolerate increased  activity will improve Outcome: Progressing   Problem: Clinical Measurements: Goal: Postoperative complications will be avoided or minimized Outcome: Progressing   Problem: Pain Management: Goal: Pain level will decrease with appropriate interventions Outcome: Progressing   Problem: Skin Integrity: Goal: Will show signs of wound healing Outcome: Progressing   Problem: Education: Goal: Knowledge of General Education information will improve Description Including pain rating scale, medication(s)/side effects and non-pharmacologic comfort measures Outcome: Progressing   Problem: Health Behavior/Discharge Planning: Goal: Ability to manage health-related needs will improve Outcome: Progressing   Problem: Clinical Measurements: Goal: Ability to maintain clinical measurements within normal limits will improve Outcome: Progressing Goal: Will remain free from infection Outcome: Progressing Goal: Diagnostic test results will improve Outcome: Progressing Goal: Respiratory complications will improve Outcome: Progressing Goal: Cardiovascular complication will be avoided Outcome: Progressing   Problem: Activity: Goal: Risk for activity intolerance will decrease Outcome: Progressing   Problem: Nutrition: Goal: Adequate nutrition will be maintained Outcome: Progressing   Problem: Coping: Goal: Level of anxiety will decrease Outcome: Progressing   Problem: Elimination: Goal: Will not experience complications related to bowel motility Outcome: Progressing Goal: Will not experience complications related to urinary retention Outcome: Progressing   Problem: Pain Managment: Goal: General experience of comfort will improve Outcome: Progressing   Problem: Safety: Goal: Ability to remain free from injury will improve Outcome: Progressing   Problem: Skin Integrity: Goal: Risk for impaired skin integrity will decrease Outcome: Progressing   Problem: Education: Goal: Knowledge  of the prescribed therapeutic regimen will improve Outcome: Progressing Goal: Understanding of discharge needs will improve Outcome: Progressing Goal: Individualized Educational Video(s) Outcome: Progressing   Problem: Activity: Goal: Ability to avoid complications of mobility impairment will improve Outcome: Progressing Goal: Ability to tolerate increased activity will improve Outcome: Progressing   Problem: Clinical Measurements: Goal: Postoperative complications will be avoided or minimized Outcome: Progressing   Problem: Pain Management: Goal: Pain level will decrease with appropriate interventions Outcome: Progressing   Problem: Skin Integrity: Goal: Will show signs of wound healing Outcome: Progressing   

## 2022-06-06 NOTE — Plan of Care (Signed)

## 2022-06-06 NOTE — Progress Notes (Signed)
Subjective: 1 Day Post-Op Procedure(s) (LRB): PERCUTANEOUS FIXATION OF FEMORAL NECK (Left) Patient reports pain as moderate.   Patient seen in rounds for Dr. Alvan Dame. Patient is resting in bed on exam this morning. She is in good spirits. No acute events overnight. She tells me she had a lot of pain overnight, but it is starting to get easier. She has not been up with PT yet.  We will start therapy today.   Objective: Vital signs in last 24 hours: Temp:  [98.1 F (36.7 C)-98.8 F (37.1 C)] 98.2 F (36.8 C) (10/25 0618) Pulse Rate:  [68-96] 75 (10/25 0618) Resp:  [11-19] 19 (10/25 0618) BP: (103-145)/(76-111) 127/87 (10/25 0618) SpO2:  [93 %-100 %] 96 % (10/25 0618)  Intake/Output from previous day:  Intake/Output Summary (Last 24 hours) at 06/06/2022 0746 Last data filed at 06/05/2022 1820 Gross per 24 hour  Intake 900 ml  Output 450 ml  Net 450 ml     Intake/Output this shift: No intake/output data recorded.  Labs: Recent Labs    06/03/22 1004 06/05/22 0347  HGB 13.4 13.4   Recent Labs    06/03/22 1004 06/05/22 0347  WBC 9.7 7.0  RBC 4.15 4.10  HCT 39.9 38.6  PLT 240 229   Recent Labs    06/03/22 1004  NA 134*  K 3.8  CL 98  CO2 20*  BUN <5*  CREATININE 0.56  GLUCOSE 106*  CALCIUM 9.0   No results for input(s): "LABPT", "INR" in the last 72 hours.  Exam: General - Patient is Alert and Oriented Extremity - Neurologically intact Sensation intact distally Intact pulses distally Dorsiflexion/Plantar flexion intact Dressing - dressing C/D/I Motor Function - intact, moving foot and toes well on exam.   Past Medical History:  Diagnosis Date   Abdominal pain, epigastric 12/16/2014   Acute on chronic pancreatitis ([place]) 03/22/2019   Acute pancreatitis    Alcohol abuse    Alcohol dependence ([place]) 12/16/2012   Medical detox successful    Alcohol induced acute pancreatitis 01/17/3709   Alcoholic cirrhosis of liver without ascites ([place]) 10/15/2016   Anxiety     Bipolar 1 disorder (HCC)    Cancer ([place])    cervical cancer   Chest pain with moderate risk for cardiac etiology 12/15/2014   Cirrhosis ([place])    COPD (chronic obstructive pulmonary disease) ([place]) 09/04/2016   Coronary artery disease    Depression    Duodenitis 11/22/2017   Eczema 10/09/2018   Elevated liver enzymes 12/22/2014   Gastritis and gastroduodenitis 09/04/2016   GERD (gastroesophageal reflux disease)    Nondisplaced fracture of neck of fifth metacarpal bone, right hand, initial encounter for closed fracture 08/26/2017   Pancreatitis    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   PTSD (post-traumatic stress disorder)    Tobacco abuse 09/04/2016   Tobacco abuse counseling    Vascular disease     Assessment/Plan: 1 Day Post-Op Procedure(s) (LRB): PERCUTANEOUS FIXATION OF FEMORAL NECK (Left) Active Problems:   COPD (chronic obstructive pulmonary disease) (HCC)   Tobacco abuse counseling   Anxiety   Fracture of femoral neck, left (HCC)   Malnutrition of moderate degree  Estimated body mass index is 18.37 kg/m as calculated from the following:   Height as of this encounter: '5\' 4"'$  (1.626 m).   Weight as of this encounter: 48.5 kg. Advance diet Up with therapy  DVT Prophylaxis - Aspirin PWB LLE 50%  Hgb pending, minimal intra-op blood loss and started at 13.4  Plan to  get up with PT today to practice PWB status.  Patient has a goal of being to her daughter's funeral on Sunday, which I think is very reasonable  From an orthopaedic standpoint, I would anticipate her being ready for discharge tomorrow.   Griffith Citron, PA-C Orthopedic Surgery [phone] 06/06/2022, 7:46 AM

## 2022-06-06 NOTE — Progress Notes (Signed)
Mobility Specialist Progress Note   06/06/22 1709  Mobility  Activity Ambulated with assistance in room  Level of Assistance Standby assist, set-up cues, supervision of patient - no hands on  Assistive Device Front wheel walker  Distance Ambulated (ft) 60 ft  LLE Weight Bearing PWB  LLE Partial Weight Bearing Percentage or Pounds  (50)  Activity Response Tolerated well  $Mobility charge 1 Mobility   Pt found in bed just having received pain meds and agreeable to mobility. Supervision throughout ambulation w/ pt able to recite WB precautions. Returned back to bed w/o fault, call bell in reach and needs met.   Pt preferring to do therapy after pain meds on going sessions.  Holland Falling Mobility Specialist Acute Rehab Office:  [phone]

## 2022-06-06 NOTE — Progress Notes (Signed)
    Durable Medical Equipment  (From admission, onward)           Start     Ordered   06/06/22 1118  For home use only DME 3 n 1  Once       Comments: Bedside commode, pt confined to one room   06/06/22 1118   06/06/22 1117  For home use only DME Walker rolling  Once       Question Answer Comment  Walker: With [place] Wheels   Patient needs a walker to treat with the following condition Gait instability      06/06/22 1118

## 2022-06-06 NOTE — Progress Notes (Signed)
PROGRESS NOTE    Felicia Perkins  YCX:[id number] DOB: 08-15-1975 DOA: 06/03/2022 PCP: Charlott Rakes, MD   Brief Narrative:  Felicia Perkins is a 46 y.o. female with medical history significant of high-grade right carotid stenosis status post enterectomy on Plavix, HTN, COPD, alcohol abuse, anxiety/depression, presented with fall and left hip pain and was diagnosed with cute subcapital fracture of left femoral neck with minimal impact, admitted to hospital service, orthopedics consulted.  Assessment & Plan:   Active Problems:   COPD (chronic obstructive pulmonary disease) (HCC)   Tobacco abuse counseling   Anxiety   Fracture of femoral neck, left (HCC)   Malnutrition of moderate degree  Left femoral neck fracture: Secondary to fall.  Ortho on board.  S/p percutaneous fixation of femoral neck by orthopedics on 06/05/2022.  Pain controlled.  To be evaluated by PT OT.  I will resume Plavix now.  COPD: Currently asymptomatic.  No wheezes on exam.  Continue current maintaining breathing treatments.  History of high-grade right-sided carotid stenosis s/p endarterectomy: Resuming Plavix.  History of alcohol dependence/abuse: No signs of withdrawal.  Continue CIWA protocol.  Tobacco abuse/dependence: Smoking cessation counseling provided.  Moderate protein calorie malnutrition: BMI only 18.  Nutrition consulted.  Hypertension: Controlled.  Continue midodrine.  Hyperlipidemia: Continue Crestor.  GERD: Continue PPI.  DVT prophylaxis: SCDs Start: 06/05/22 2002 Place TED hose Start: 06/05/22 2002 Place and maintain sequential compression device Start: 06/03/22 1412 Ortho recommends aspirin for DVT prophylaxis.  She is also on Plavix.   Code Status: Full Code  Family Communication:  None present at bedside.  Plan of care discussed with patient in length and he/she verbalized understanding and agreed with it.  Status is: Inpatient Remains inpatient appropriate because: Patient  scheduled to be seen by PT OT.  Anticipate discharge tomorrow.   Estimated body mass index is 18.37 kg/m as calculated from the following:   Height as of this encounter: '5\' 4"'$  (1.626 m).   Weight as of this encounter: 48.5 kg.    Nutritional Assessment: Body mass index is 18.37 kg/m.Marland Kitchen Seen by dietician.  I agree with the assessment and plan as outlined below: Nutrition Status: Nutrition Problem: Moderate Malnutrition Etiology: chronic illness (alcohol use, carotid stenosis, anxiety/depression) Signs/Symptoms: mild fat depletion, moderate fat depletion, severe muscle depletion, moderate muscle depletion Interventions: Ensure Enlive (each supplement provides 350kcal and 20 grams of protein), MVI  . Skin Assessment: I have examined the patient's skin and I agree with the wound assessment as performed by the wound care RN as outlined below:    Consultants:  Orthopedics  Procedures:  None  Antimicrobials:  Anti-infectives (From admission, onward)    Start     Dose/Rate Route Frequency Ordered Stop   06/06/22 0600  ceFAZolin (ANCEF) IVPB 2g/100 mL premix        2 g 200 mL/hr over 30 Minutes Intravenous On call to O.R. 06/05/22 1506 06/05/22 1710   06/05/22 2130  ceFAZolin (ANCEF) IVPB 2g/100 mL premix        2 g 200 mL/hr over 30 Minutes Intravenous Every 6 hours 06/05/22 2028 06/06/22 0329   06/05/22 1508  ceFAZolin (ANCEF) 2-4 GM/100ML-% IVPB       Note to Pharmacy: Cameron Sprang M: cabinet override      06/05/22 1508 06/05/22 1714         Subjective:  Seen and examined.  Doing well.  No complaints.  Objective: Vitals:   06/05/22 1954 06/05/22 2000 06/06/22 4970 06/06/22 2637  BP: (!) 129/90  127/87 130/88  Pulse: 70  75 79  Resp: '18  19 18  '$ Temp: 98.2 F (36.8 C)  98.2 F (36.8 C) 98.3 F (36.8 C)  TempSrc: Oral  Oral Oral  SpO2: 95% 95% 96% 100%  Weight:      Height:        Intake/Output Summary (Last 24 hours) at 06/06/2022 0937 Last data filed  at 06/06/2022 0900 Gross per 24 hour  Intake 1260 ml  Output 50 ml  Net 1210 ml    Filed Weights   06/03/22 0906  Weight: 48.5 kg    Examination:  General exam: Appears calm and comfortable  Respiratory system: Clear to auscultation. Respiratory effort normal. Cardiovascular system: S1 & S2 heard, RRR. No JVD, murmurs, rubs, gallops or clicks. No pedal edema. Gastrointestinal system: Abdomen is nondistended, soft and nontender. No organomegaly or masses felt. Normal bowel sounds heard. Central nervous system: Alert and oriented. No focal neurological deficits. Skin: No rashes, lesions or ulcers.  Psychiatry: Judgement and insight appear normal. Mood & affect appropriate.    Data Reviewed: I have personally reviewed following labs and imaging studies  CBC: Recent Labs  Lab 06/03/22 1004 06/05/22 0347  WBC 9.7 7.0  NEUTROABS  --  5.3  HGB 13.4 13.4  HCT 39.9 38.6  MCV 96.1 94.1  PLT 240 101    Basic Metabolic Panel: Recent Labs  Lab 06/03/22 1004 06/06/22 0629  NA 134* 136  K 3.8 4.6  CL 98 101  CO2 20* 18*  GLUCOSE 106* 112*  BUN <5* 9  CREATININE 0.56 0.49  CALCIUM 9.0 9.4    GFR: Estimated Creatinine Clearance: 66.6 mL/min (by C-G formula based on SCr of 0.49 mg/dL). Liver Function Tests: Recent Labs  Lab 06/03/22 1004  AST 28  ALT 20  ALKPHOS 92  BILITOT 0.5  PROT 6.8  ALBUMIN 3.9    No results for input(s): "LIPASE", "AMYLASE" in the last 168 hours. No results for input(s): "AMMONIA" in the last 168 hours. Coagulation Profile: No results for input(s): "INR", "PROTIME" in the last 168 hours. Cardiac Enzymes: No results for input(s): "CKTOTAL", "CKMB", "CKMBINDEX", "TROPONINI" in the last 168 hours. BNP (last 3 results) No results for input(s): "PROBNP" in the last 8760 hours. HbA1C: No results for input(s): "HGBA1C" in the last 72 hours. CBG: No results for input(s): "GLUCAP" in the last 168 hours. Lipid Profile: No results for  input(s): "CHOL", "HDL", "LDLCALC", "TRIG", "CHOLHDL", "LDLDIRECT" in the last 72 hours. Thyroid Function Tests: No results for input(s): "TSH", "T4TOTAL", "FREET4", "T3FREE", "THYROIDAB" in the last 72 hours. Anemia Panel: No results for input(s): "VITAMINB12", "FOLATE", "FERRITIN", "TIBC", "IRON", "RETICCTPCT" in the last 72 hours. Sepsis Labs: No results for input(s): "PROCALCITON", "LATICACIDVEN" in the last 168 hours.  Recent Results (from the past 240 hour(s))  Surgical PCR screen     Status: None   Collection Time: 06/04/22  5:54 AM   Specimen: Nasal Mucosa; Nasal Swab  Result Value Ref Range Status   MRSA, PCR NEGATIVE NEGATIVE Final   Staphylococcus aureus NEGATIVE NEGATIVE Final    Comment: (NOTE) The Xpert SA Assay (FDA approved for NASAL specimens in patients 60 years of age and older), is one component of a comprehensive surveillance program. It is not intended to diagnose infection nor to guide or monitor treatment. Performed at [facility], [place] [address], [place], [place] [address]      Radiology Studies: DG HIP UNILAT WITH PELVIS  2-3 VIEWS LEFT  Result Date: 06/05/2022 CLINICAL DATA:  Percutaneous fixation of left femoral neck. EXAM: DG HIP (WITH OR WITHOUT PELVIS) 2-3V LEFT COMPARISON:  Preoperative imaging. FINDINGS: Four fluoroscopic spot views of the left hip obtained in the operating room. 3 screws traverse the left femoral neck. Fluoroscopy time 1 minutes 42 seconds. Dose 10.37 mGy. IMPRESSION: Intraoperative fluoroscopy during left femoral neck fracture fixation. Electronically Signed   By: Keith Rake M.D.   On: 06/05/2022 19:21   DG C-Arm 1-60 Min-No Report  Result Date: 06/05/2022 Fluoroscopy was utilized by the requesting physician.  No radiographic interpretation.    Scheduled Meds:  amLODipine  2.5 mg Oral Daily   aspirin  81 mg Oral BID   dexamethasone (DECADRON) injection  10 mg Intravenous Once   docusate sodium  100 mg Oral BID    famotidine  20 mg Oral Daily   feeding supplement  237 mL Oral BID BM   ferrous sulfate  325 mg Oral TID PC   fluticasone  2 spray Each Nare Daily   folic acid  1 mg Oral Daily   loratadine  10 mg Oral Daily   mometasone-formoterol  2 puff Inhalation BID   multivitamin with minerals  1 tablet Oral Daily   mupirocin ointment  1 Application Nasal BID   nicotine  21 mg Transdermal Daily   olopatadine  1 drop Both Eyes BID   pantoprazole  40 mg Oral Daily   polyethylene glycol  17 g Oral Daily   rosuvastatin  10 mg Oral Daily   senna  2 tablet Oral QHS   thiamine  100 mg Oral Daily   Vitamin D (Ergocalciferol)  50,000 Units Oral Q7 days   Continuous Infusions:  sodium chloride       LOS: 3 days   Darliss Cheney, MD [facility]  06/06/2022, 9:37 AM   *Please note that this is a verbal dictation therefore any spelling or grammatical errors are due to the "[place] [employer]" system interpretation.  Please page via [place] and do not message via secure chat for urgent patient care matters. Secure chat can be used for non urgent patient care matters.  How to contact the [facility] Attending or Consulting provider [place] or covering provider during after hours [place], for this patient?  Check the care team in [facility] and look for a) attending/consulting [facility] provider listed and b) the [facility] team listed. Page or secure chat [phone]. Log into [email or web address] and use [place]'s universal password to access. If you do not have the password, please contact the hospital operator. Locate the [facility] provider you are looking for under [facility] and page to a number that you can be directly reached. If you still have difficulty reaching the provider, please page the [place] (Director on Call) for the Hospitalists listed on amion for assistance.

## 2022-06-07 DIAGNOSIS — S72002A Fracture of unspecified part of neck of left femur, initial encounter for closed fracture: Secondary | ICD-10-CM | POA: Diagnosis not present

## 2022-06-07 LAB — CBC
HCT: 33.7 % — ABNORMAL LOW (ref 36.0–46.0)
Hemoglobin: 11.8 g/dL — ABNORMAL LOW (ref 12.0–15.0)
MCH: 33.1 pg (ref 26.0–34.0)
MCHC: 35 g/dL (ref 30.0–36.0)
MCV: 94.4 fL (ref 80.0–100.0)
Platelets: 227 10*3/uL (ref 150–400)
RBC: 3.57 MIL/uL — ABNORMAL LOW (ref 3.87–5.11)
RDW: 13.2 % (ref 11.5–15.5)
WBC: 9.5 10*3/uL (ref 4.0–10.5)
nRBC: 0 % (ref 0.0–0.2)

## 2022-06-07 MED ORDER — POLYETHYLENE GLYCOL 3350 17 G PO PACK: 17.0000 g | PACK | Freq: Every day | ORAL | 0 refills | Status: AC

## 2022-06-07 MED ORDER — FAMOTIDINE 20 MG PO TABS: 20.0000 mg | ORAL_TABLET | Freq: Two times a day (BID) | ORAL | 2 refills | Status: DC

## 2022-06-07 MED ORDER — DOCUSATE SODIUM 100 MG PO CAPS: 100.0000 mg | ORAL_CAPSULE | Freq: Two times a day (BID) | ORAL | 0 refills | Status: DC

## 2022-06-07 MED ORDER — HYDROCODONE-ACETAMINOPHEN 5-325 MG PO TABS: 1.0000 | ORAL_TABLET | ORAL | 0 refills | Status: DC | PRN

## 2022-06-07 MED ORDER — METHOCARBAMOL 500 MG PO TABS
500.0000 mg | ORAL_TABLET | Freq: Four times a day (QID) | ORAL | Status: DC | PRN
  Administered 2022-06-07: 500 mg via ORAL
  Filled 2022-06-07: qty 1

## 2022-06-07 MED ORDER — METHOCARBAMOL 500 MG PO TABS: 500.0000 mg | ORAL_TABLET | Freq: Four times a day (QID) | ORAL | 2 refills | Status: DC | PRN

## 2022-06-07 MED ORDER — VITAMIN D (ERGOCALCIFEROL) 1.25 MG (50000 UNIT) PO CAPS: 50000.0000 [IU] | ORAL_CAPSULE | ORAL | 0 refills | Status: DC

## 2022-06-07 NOTE — Discharge Summary (Addendum)
Physician Discharge Summary  Felicia Perkins MPN:[id number] DOB: 12/25/1975 DOA: 06/03/2022  PCP: Charlott Rakes, MD  Admit date: 06/03/2022 Discharge date: 06/07/2022 30 Day Unplanned Readmission Risk Score    Flowsheet Row ED to Hosp-Admission (Current) from 06/03/2022 in [place]  30 Day Unplanned Readmission Risk Score (%) 17.83 Filed at 06/07/2022 0801       This score is the patient's risk of an unplanned readmission within 30 days of being discharged (0 -100%). The score is based on dignosis, age, lab data, medications, orders, and past utilization.   Low:  0-14.9   Medium: 15-21.9   High: 22-29.9   Extreme: 30 and above          Admitted From: Home Disposition: Home  Recommendations for Outpatient Follow-up:  Follow up with PCP in 1-2 weeks Please obtain BMP/CBC in one week Follow-up with orthopedics in 2 weeks Please follow up with your PCP on the following pending results: Unresulted Labs (From admission, onward)     Start     Ordered   Signed and Held  Type and screen Order type and screen if day of surgery is less than 15 days from draw of preadmission visit or order morning of surgery if day of surgery is greater than 6 days from preadmission visit.  Once,   R       Comments: Order type and screen if day of surgery is less than 15 days from draw of preadmission visit or order morning of surgery if day of surgery is greater than 6 days from preadmission visit.    Signed and Held              Home Health: Yes Equipment/Devices: Bedside commode and rolling walker  Discharge Condition: Stable CODE STATUS: Full code Diet recommendation: Cardiac  Subjective: Seen and examined.  She has no complaints other than minimal pain.  She is walking independently with the use of walker and desires to go home today.  She has been cleared by orthopedics.  Brief/Interim Summary: Felicia Perkins is a 46 y.o. female with medical  history significant of high-grade right carotid stenosis status post enterectomy on Plavix, HTN, COPD, alcohol abuse, anxiety/depression, presented with fall and left hip pain and was diagnosed with cute subcapital fracture of left femoral neck with minimal impact, admitted to hospital service, orthopedics consulted.  S/p percutaneous fixation of femoral neck by orthopedics on 06/05/2022.  PT OT recommended home health.  She has been cleared by orthopedics with recommendations to continue only Plavix for DVT prophylaxis, which she was taking PTA for DVT prophylaxis.  She is been prescribed pain medications by orthopedics.   COPD: Currently asymptomatic.  No wheezes on exam.  Continue current maintaining breathing treatments.   History of high-grade right-sided carotid stenosis s/p endarterectomy: Resuming Plavix.   History of alcohol dependence/abuse: No signs of withdrawal.  Continue CIWA protocol.   Tobacco abuse/dependence: Smoking cessation counseling provided.   Moderate protein calorie malnutrition: BMI only 18.  Nutrition consulted.   Hypotension: Controlled.  Continue midodrine.   Hyperlipidemia: Continue Crestor.   GERD: Continue PPI.  Discharge plan was discussed with patient and/or family member and they verbalized understanding and agreed with it.  Discharge Diagnoses:  Active Problems:   COPD (chronic obstructive pulmonary disease) (HCC)   Tobacco abuse counseling   Anxiety   Fracture of femoral neck, left (HCC)   Malnutrition of moderate degree    Discharge Instructions   Allergies  as of 06/07/2022   No Known Allergies      Medication List     STOP taking these medications    lidocaine 2 % solution Commonly known as: XYLOCAINE       TAKE these medications    albuterol 108 (90 Base) MCG/ACT inhaler Commonly known as: VENTOLIN HFA Inhale 1-2 puffs into the lungs every 6 (six) hours as needed for wheezing or shortness of breath.   amLODipine 2.5 MG  tablet Commonly known as: NORVASC Take 1 tablet (2.5 mg total) by mouth daily.   cetirizine 10 MG tablet Commonly known as: ZYRTEC Take 1 tablet (10 mg total) by mouth daily.   clopidogrel 75 MG tablet Commonly known as: PLAVIX TAKE 1 TABLET BY MOUTH EVERY DAY   docusate sodium 100 MG capsule Commonly known as: COLACE Take 1 capsule (100 mg total) by mouth 2 (two) times daily.   Dulera 200-5 MCG/ACT Aero Generic drug: mometasone-formoterol Inhale 2 puffs into the lungs daily as needed.   famotidine 20 MG tablet Commonly known as: PEPCID Take 1 tablet (20 mg total) by mouth 2 (two) times daily. What changed: when to take this   fluticasone 50 MCG/ACT nasal spray Commonly known as: FLONASE Place 2 sprays into both nostrils daily. What changed:  when to take this reasons to take this   HYDROcodone-acetaminophen 5-325 MG tablet Commonly known as: NORCO/VICODIN Take 1 tablet by mouth every 4 (four) hours as needed for severe pain.   methocarbamol 500 MG tablet Commonly known as: ROBAXIN Take 1 tablet (500 mg total) by mouth every 6 (six) hours as needed for muscle spasms.   olopatadine 0.1 % ophthalmic solution Commonly known as: Patanol Place 1 drop into both eyes 2 (two) times daily.   pantoprazole 40 MG tablet Commonly known as: PROTONIX Take 1 tablet (40 mg total) by mouth daily.   polyethylene glycol 17 g packet Commonly known as: MIRALAX / GLYCOLAX Take 17 g by mouth daily.   rosuvastatin 10 MG tablet Commonly known as: CRESTOR TAKE 1 TABLET BY MOUTH EVERY DAY   Vitamin D (Ergocalciferol) 1.25 MG (50000 UNIT) Caps capsule Commonly known as: DRISDOL Take 1 capsule (50,000 Units total) by mouth every Wednesday. Start taking on: June 13, 2022               Durable Medical Equipment  (From admission, onward)           Start     Ordered   06/06/22 1118  For home use only DME 3 n 1  Once       Comments: Bedside commode, pt confined to one  room   06/06/22 1118   06/06/22 1117  For home use only DME Walker rolling  Once       Question Answer Comment  Walker: With [place]   Patient needs a walker to treat with the following condition Gait instability      06/06/22 1118            Follow-up Information     Paralee Cancel, MD. Schedule an appointment as soon as possible for a visit in 2 week(s).   Specialty: Orthopedic Surgery Contact information: [address] [place] [address] [phone]         Charlott Rakes, MD Follow up in 1 week(s).   Specialty: Family Medicine Contact information: [place] [address] [phone]  No Known Allergies  Consultations: Ortho    Procedures/Studies: DG HIP UNILAT WITH PELVIS 2-3 VIEWS LEFT  Result Date: 06/05/2022 CLINICAL DATA:  Percutaneous fixation of left femoral neck. EXAM: DG HIP (WITH OR WITHOUT PELVIS) 2-3V LEFT COMPARISON:  Preoperative imaging. FINDINGS: Four fluoroscopic spot views of the left hip obtained in the operating room. 3 screws traverse the left femoral neck. Fluoroscopy time 1 minutes 42 seconds. Dose 10.37 mGy. IMPRESSION: Intraoperative fluoroscopy during left femoral neck fracture fixation. Electronically Signed   By: Keith Rake M.D.   On: 06/05/2022 19:21   DG C-Arm 1-60 Min-No Report  Result Date: 06/05/2022 Fluoroscopy was utilized by the requesting physician.  No radiographic interpretation.   CT Hip Left Wo Contrast  Result Date: 06/03/2022 CLINICAL DATA:  Left hip fracture after fall EXAM: CT OF THE LEFT HIP WITHOUT CONTRAST TECHNIQUE: Multidetector CT imaging of the left hip was performed according to the standard protocol. Multiplanar CT image reconstructions were also generated. RADIATION DOSE REDUCTION: This exam was performed according to the departmental dose-optimization program which includes automated exposure control, adjustment of the mA  and/or kV according to patient size and/or use of iterative reconstruction technique. COMPARISON:  Same-day x-ray, CT 10/24/2020 FINDINGS: Bones/Joint/Cartilage Acute subcapital fracture of the left femoral neck with minimal impaction. No significant displacement or angulation. No fracture involvement of the femoral head or intertrochanteric region. Hip joint alignment is maintained without dislocation. Intact bony pelvis without fracture or diastasis. Small sclerotic lesion within the posterior left ilium is stable from prior CT (series 5, image 18), likely benign fibro-osseous lesion. No suspicious lytic or sclerotic bone lesion. Bilateral hip joint spaces are preserved. Moderate-sized left hip joint lipohemarthrosis. Ligaments Suboptimally assessed by CT. Muscles and Tendons No acute musculotendinous findings by CT. Soft tissues Negative for hematoma within the soft tissues. No inguinal or intrapelvic lymphadenopathy. Atherosclerotic vascular calcifications are present. IMPRESSION: 1. Acute subcapital fracture of the left femoral neck with minimal impaction. 2. Moderate-sized left hip joint lipohemarthrosis. Electronically Signed   By: Davina Poke D.O.   On: 06/03/2022 13:01   CT Head Wo Contrast  Result Date: 06/03/2022 CLINICAL DATA:  Head trauma, moderate to severe. EXAM: CT HEAD WITHOUT CONTRAST TECHNIQUE: Contiguous axial images were obtained from the base of the skull through the vertex without intravenous contrast. RADIATION DOSE REDUCTION: This exam was performed according to the departmental dose-optimization program which includes automated exposure control, adjustment of the mA and/or kV according to patient size and/or use of iterative reconstruction technique. COMPARISON:  None Available. FINDINGS: Brain: No hydrocephalus. No mass, hemorrhage, edema or other evidence of acute parenchymal abnormality. No extra-axial hemorrhage. Vascular: No hyperdense vessel or unexpected calcification.  Skull: Normal. Negative for fracture or focal lesion. Sinuses/Orbits: No acute finding. Other: None. IMPRESSION: Negative head CT. No intracranial hemorrhage or skull fracture. Electronically Signed   By: Franki Cabot M.D.   On: 06/03/2022 12:51   DG Chest 1 View  Result Date: 06/03/2022 CLINICAL DATA:  Preop chest x-ray. EXAM: CHEST  1 VIEW COMPARISON:  08/15/2020 FINDINGS: Normal heart size and mediastinal contours. No acute infiltrate or edema. No effusion or pneumothorax. No acute osseous findings. IMPRESSION: Negative portable chest. Electronically Signed   By: Jorje Guild M.D.   On: 06/03/2022 09:55   DG Hip Unilat W or Wo Pelvis 2-3 Views Left  Result Date: 06/03/2022 CLINICAL DATA:  Fall with pain EXAM: DG HIP (WITH OR WITHOUT PELVIS) 3V LEFT COMPARISON:  None Available. FINDINGS: Left femoral neck  fracture with mild impaction. No dislocation. No degenerative change. Subjective osteopenia. IMPRESSION: Subcapital left femoral neck fracture with mild impaction. Electronically Signed   By: Jorje Guild M.D.   On: 06/03/2022 09:52     Discharge Exam: Vitals:   06/07/22 0454 06/07/22 0828  BP: (!) 125/90 127/77  Pulse: 77 70  Resp: 18 18  Temp: 98 F (36.7 C) 97.9 F (36.6 C)  SpO2: 99% 100%   Vitals:   06/06/22 1846 06/06/22 1949 06/07/22 0454 06/07/22 0828  BP: 115/83 103/79 (!) 125/90 127/77  Pulse: 80 82 77 70  Resp: '18 19 18 18  '$ Temp: 98.1 F (36.7 C) 98 F (36.7 C) 98 F (36.7 C) 97.9 F (36.6 C)  TempSrc:  Oral Oral   SpO2: 100% 98% 99% 100%  Weight:      Height:        General: Pt is alert, awake, not in acute distress Cardiovascular: RRR, S1/S2 +, no rubs, no gallops Respiratory: CTA bilaterally, no wheezing, no rhonchi Abdominal: Soft, NT, ND, bowel sounds + Extremities: no edema, no cyanosis    The results of significant diagnostics from this hospitalization (including imaging, microbiology, ancillary and laboratory) are listed below for  reference.     Microbiology: Recent Results (from the past 240 hour(s))  Surgical PCR screen     Status: None   Collection Time: 06/04/22  5:54 AM   Specimen: Nasal Mucosa; Nasal Swab  Result Value Ref Range Status   MRSA, PCR NEGATIVE NEGATIVE Final   Staphylococcus aureus NEGATIVE NEGATIVE Final    Comment: (NOTE) The Xpert SA Assay (FDA approved for NASAL specimens in patients 34 years of age and older), is one component of a comprehensive surveillance program. It is not intended to diagnose infection nor to guide or monitor treatment. Performed at [facility], [place] [address], [place], [place] [address]      Labs: BNP (last 3 results) No results for input(s): "BNP" in the last 8760 hours. Basic Metabolic Panel: Recent Labs  Lab 06/03/22 1004 06/06/22 0629  NA 134* 136  K 3.8 4.6  CL 98 101  CO2 20* 18*  GLUCOSE 106* 112*  BUN <5* 9  CREATININE 0.56 0.49  CALCIUM 9.0 9.4   Liver Function Tests: Recent Labs  Lab 06/03/22 1004  AST 28  ALT 20  ALKPHOS 92  BILITOT 0.5  PROT 6.8  ALBUMIN 3.9   No results for input(s): "LIPASE", "AMYLASE" in the last 168 hours. No results for input(s): "AMMONIA" in the last 168 hours. CBC: Recent Labs  Lab 06/03/22 1004 06/05/22 0347 06/06/22 0927 06/07/22 0402  WBC 9.7 7.0 11.2* 9.5  NEUTROABS  --  5.3  --   --   HGB 13.4 13.4 13.9 11.8*  HCT 39.9 38.6 40.0 33.7*  MCV 96.1 94.1 94.1 94.4  PLT 240 229 238 227   Cardiac Enzymes: No results for input(s): "CKTOTAL", "CKMB", "CKMBINDEX", "TROPONINI" in the last 168 hours. BNP: Invalid input(s): "POCBNP" CBG: No results for input(s): "GLUCAP" in the last 168 hours. D-Dimer No results for input(s): "DDIMER" in the last 72 hours. Hgb A1c No results for input(s): "HGBA1C" in the last 72 hours. Lipid Profile No results for input(s): "CHOL", "HDL", "LDLCALC", "TRIG", "CHOLHDL", "LDLDIRECT" in the last 72 hours. Thyroid function studies No results for input(s):  "TSH", "T4TOTAL", "T3FREE", "THYROIDAB" in the last 72 hours.  Invalid input(s): "FREET3" Anemia work up No results for input(s): "VITAMINB12", "FOLATE", "FERRITIN", "TIBC", "IRON", "RETICCTPCT" in  the last 72 hours. Urinalysis    Component Value Date/Time   COLORURINE YELLOW 05/04/2022 1352   APPEARANCEUR CLEAR 05/04/2022 1352   LABSPEC 1.010 05/04/2022 1352   PHURINE 7.0 05/04/2022 1352   GLUCOSEU NEGATIVE 05/04/2022 1352   HGBUR NEGATIVE 05/04/2022 1352   BILIRUBINUR NEGATIVE 05/04/2022 1352   KETONESUR NEGATIVE 05/04/2022 1352   PROTEINUR NEGATIVE 05/04/2022 1352   UROBILINOGEN 1.0 09/14/2016 1531   NITRITE NEGATIVE 05/04/2022 1352   LEUKOCYTESUR NEGATIVE 05/04/2022 1352   Sepsis Labs Recent Labs  Lab 06/03/22 1004 06/05/22 0347 06/06/22 0927 06/07/22 0402  WBC 9.7 7.0 11.2* 9.5   Microbiology Recent Results (from the past 240 hour(s))  Surgical PCR screen     Status: None   Collection Time: 06/04/22  5:54 AM   Specimen: Nasal Mucosa; Nasal Swab  Result Value Ref Range Status   MRSA, PCR NEGATIVE NEGATIVE Final   Staphylococcus aureus NEGATIVE NEGATIVE Final    Comment: (NOTE) The Xpert SA Assay (FDA approved for NASAL specimens in patients 58 years of age and older), is one component of a comprehensive surveillance program. It is not intended to diagnose infection nor to guide or monitor treatment. Performed at [facility], [place] [address], [place],  [address]      Time coordinating discharge: Over 30 minutes  SIGNED:   Darliss Cheney, MD  [facility] 06/07/2022, 12:38 PM *Please note that this is a verbal dictation therefore any spelling or grammatical errors are due to the "[place] [employer]" system interpretation. If 7PM-7AM, please contact night-coverage [email or web address]

## 2022-06-07 NOTE — Progress Notes (Signed)
Nutrition Follow-up  DOCUMENTATION CODES:   Non-severe (moderate) malnutrition in context of chronic illness  INTERVENTION:  Encourage ongoing adequate PO intake Continue Ensure Enlive po BID, each supplement provides 350 kcal and 20 grams of protein once discharged MVI with minerals daily  NUTRITION DIAGNOSIS:   Moderate Malnutrition related to chronic illness (alcohol use, carotid stenosis, anxiety/depression) as evidenced by mild fat depletion, moderate fat depletion, severe muscle depletion, moderate muscle depletion.  ongoing  GOAL:   Patient will meet greater than or equal to 90% of their needs  progressing  MONITOR:   PO intake, Supplement acceptance, Labs, Weight trends  REASON FOR ASSESSMENT:   Consult Hip fracture protocol  ASSESSMENT:   Pt admitted after a fall leading to nondisplaced L femoral neck fracture. PMH significant for high-grade R carotid stenosis, HTN, COPD, alcohol use and anxiety/depression.  10/24 s/p percutaneous fixation of L femoral neck fracture  Plans for discharge today. Checked in with pt to drop off coupons for Ensure.  She reports and improving appetite and PO intake. She previously felt bloated/fullness d/t constipation which she reports improvements after having a BM last night.  1 meal completions documented to be 95% for breakfast yesterday. She endorses consuming 2-3 Ensure daily and plans to continue these at home.   Pt denies additional nutrition related needs or questions at this time.   No updated weights on file to review.   Medications: colace, pepcid, ferrous sulfate, folvite, MVI, protonix, miralax, senna, thiamine, Vitamin D every 7 days  Labs: reviewed  Diet Order:   Diet Order             Diet regular Room service appropriate? Yes; Fluid consistency: Thin  Diet effective now                   EDUCATION NEEDS:   Education needs have been addressed  Skin:  Skin Assessment: Reviewed RN Assessment (L  hip incision)  Last BM:  10/25  Height:   Ht Readings from Last 1 Encounters:  06/03/22 '5\' 4"'$  (1.626 m)    Weight:   Wt Readings from Last 1 Encounters:  06/03/22 48.5 kg   BMI:  Body mass index is 18.37 kg/m.  Estimated Nutritional Needs:   Kcal:  1500-1700  Protein:  75-90g  Fluid:  >/=1.5L  Clayborne Dana, RDN, LDN Clinical Nutrition

## 2022-06-07 NOTE — Progress Notes (Signed)
Physical Therapy Treatment Patient Details Name: Felicia Perkins MRN: [id number] DOB: [birth year] Today's Date: 06/07/2022   History of Present Illness Pt is a 46 y/o female who presents s/p fall at home, sustaining a femoral neck fracture on 06/03/2022. Pt now s/p percutaneous fixation of L femoral neck fx on 06/05/2022. PMH significant for  pancreatitis, bipolar I disorder, CA, COPD, CAD, PTSD, closed fracture of 5th metacarpal (R) 2019, foot surgery.    PT Comments    Pt progressing towards physical therapy goals. Was able to perform transfers and ambulation with gross modified independence to supervision for safety and RW for support. Pt appears to be maintaining WB precautions well, and noted DME was delivered to the room prior to session beginning. PT adjusted RW to appropriate height and placed plastic sliders on the RW instead of rubber stoppers. Will continue to follow and progress as abler per POC.    Recommendations for follow up therapy are one component of a multi-disciplinary discharge planning process, led by the attending physician.  Recommendations may be updated based on patient status, additional functional criteria and insurance authorization.  Follow Up Recommendations  No PT follow up     Assistance Recommended at Discharge Set up Supervision/Assistance  Patient can return home with the following A little help with walking and/or transfers;A little help with bathing/dressing/bathroom;Assistance with cooking/housework;Assist for transportation;Help with stairs or ramp for entrance   Equipment Recommendations  Rolling walker (2 wheels);BSC/3in1    Recommendations for Other Services       Precautions / Restrictions Precautions Precautions: Fall Restrictions Weight Bearing Restrictions: Yes LLE Weight Bearing: Partial weight bearing LLE Partial Weight Bearing Percentage or Pounds: 50     Mobility  Bed Mobility Overal bed mobility: Modified Independent              General bed mobility comments: Increased time but pt was able to transition to/from EOB without assistance. Educated on using strong RLE to assist LLE in positioning.    Transfers Overall transfer level: Modified independent Equipment used: Rolling walker (2 wheels) Transfers: Sit to/from Stand             General transfer comment: Pt demonstrating good control with transition to/from standing.    Ambulation/Gait Ambulation/Gait assistance: Min guard, Supervision Gait Distance (Feet): 100 Feet Assistive device: Rolling walker (2 wheels) Gait Pattern/deviations: Step-to pattern, Step-through pattern, Decreased stride length, Trunk flexed, Narrow base of support Gait velocity: Decreased Gait velocity interpretation: <1.31 ft/sec, indicative of household ambulator   General Gait Details: Pt initially with step-to pattern progressing to a step-through pattern. Appears to be maintaining weight bearing restrictions well with RW. Pt able to increase heel strike on the L as gait training progressed.   Stairs             Wheelchair Mobility    Modified Rankin (Stroke Patients Only)       Balance Overall balance assessment: Needs assistance Sitting-balance support: No upper extremity supported, Feet supported Sitting balance-Leahy Scale: Good     Standing balance support: Bilateral upper extremity supported, During functional activity, Reliant on assistive device for balance Standing balance-Leahy Scale: Poor Standing balance comment: Reliant on UE support                            Cognition Arousal/Alertness: Awake/alert Behavior During Therapy: WFL for tasks assessed/performed Overall Cognitive Status: Within Functional Limits for tasks assessed  Exercises General Exercises - Lower Extremity Long Arc Quad: 15 reps Hip ABduction/ADduction: 10 reps Other Exercises Other Exercises:  Sitting EOB with quad stretch bringing knee into flexion. Pt able to achieve 90 into stretch at the knee and reports feeling a pull in the quad.    General Comments        Pertinent Vitals/Pain Pain Assessment Faces Pain Scale: Hurts even more Pain Location: LLE Pain Descriptors / Indicators: Operative site guarding, Grimacing, Sore    Home Living Family/patient expects to be discharged to:: Private residence Living Arrangements: Spouse/significant other;Children Available Help at Discharge: Family;Available 24 hours/day Type of Home: House Home Access: Level entry       Home Layout: Two level;Able to live on main level with bedroom/bathroom Home Equipment: Crutches      Prior Function            PT Goals (current goals can now be found in the care plan section) Acute Rehab PT Goals Patient Stated Goal: Be able to go to her daughter's funeral on Sunday PT Goal Formulation: With patient Time For Goal Achievement: 06/13/22 Potential to Achieve Goals: Good Progress towards PT goals: Progressing toward goals    Frequency    Min 4X/week      PT Plan Current plan remains appropriate    Co-evaluation              AM-PAC PT "6 Clicks" Mobility   Outcome Measure  Help needed turning from your back to your side while in a flat bed without using bedrails?: None Help needed moving from lying on your back to sitting on the side of a flat bed without using bedrails?: None Help needed moving to and from a bed to a chair (including a wheelchair)?: A Little Help needed standing up from a chair using your arms (e.g., wheelchair or bedside chair)?: None Help needed to walk in hospital room?: A Little Help needed climbing 3-5 steps with a railing? : A Little 6 Click Score: 21    End of Session Equipment Utilized During Treatment: Gait belt Activity Tolerance: Patient tolerated treatment well Patient left: in chair;with call bell/phone within reach;with chair alarm  set Nurse Communication: Mobility status PT Visit Diagnosis: Unsteadiness on feet (R26.81);Pain Pain - Right/Left: Left Pain - part of body: Hip     Time: 1147-1209 PT Time Calculation (min) (ACUTE ONLY): 22 min  Charges:  $Gait Training: 8-22 mins                     Rolinda Roan, PT, DPT Acute Rehabilitation Services Secure Chat Preferred Office: [phone]    Thelma Comp 06/07/2022, 1:44 PM

## 2022-06-07 NOTE — Progress Notes (Signed)
Subjective: 2 Days Post-Op Procedure(s) (LRB): PERCUTANEOUS FIXATION OF FEMORAL NECK (Left) Patient reports pain as mild.   Patient seen in rounds for Dr. Alvan Dame. Patient is resting in bed on exam this morning. No acute events overnight. She is in good spirits today. She felt comfortable with PWB precautions yesterday, and is hoping to get home today or tomorrow. Voiding without difficulty. She did mention having muscle tightness/spasm and notes she has not been getting a muscle relaxant. We will continue therapy today.   Objective: Vital signs in last 24 hours: Temp:  [98 F (36.7 C)-98.5 F (36.9 C)] 98 F (36.7 C) (10/26 0454) Pulse Rate:  [77-83] 77 (10/26 0454) Resp:  [16-19] 18 (10/26 0454) BP: (103-130)/(76-90) 125/90 (10/26 0454) SpO2:  [98 %-100 %] 99 % (10/26 0454)  Intake/Output from previous day:  Intake/Output Summary (Last 24 hours) at 06/07/2022 0748 Last data filed at 06/06/2022 1826 Gross per 24 hour  Intake 940 ml  Output --  Net 940 ml     Intake/Output this shift: No intake/output data recorded.  Labs: Recent Labs    06/05/22 0347 06/06/22 0927 06/07/22 0402  HGB 13.4 13.9 11.8*   Recent Labs    06/06/22 0927 06/07/22 0402  WBC 11.2* 9.5  RBC 4.25 3.57*  HCT 40.0 33.7*  PLT 238 227   Recent Labs    06/06/22 0629  NA 136  K 4.6  CL 101  CO2 18*  BUN 9  CREATININE 0.49  GLUCOSE 112*  CALCIUM 9.4   No results for input(s): "LABPT", "INR" in the last 72 hours.  Exam: General - Patient is Alert and Oriented Extremity - Neurologically intact Sensation intact distally Intact pulses distally Dorsiflexion/Plantar flexion intact Dressing - dressing C/D/I Motor Function - intact, moving foot and toes well on exam.   Past Medical History:  Diagnosis Date   Abdominal pain, epigastric 12/16/2014   Acute on chronic pancreatitis ([place]) 03/22/2019   Acute pancreatitis    Alcohol abuse    Alcohol dependence ([place]) 12/16/2012   Medical detox  successful    Alcohol induced acute pancreatitis 6/44/0347   Alcoholic cirrhosis of liver without ascites ([place]) 10/15/2016   Anxiety    Bipolar 1 disorder (HCC)    Cancer ([place])    cervical cancer   Chest pain with moderate risk for cardiac etiology 12/15/2014   Cirrhosis ([place])    COPD (chronic obstructive pulmonary disease) ([place]) 09/04/2016   Coronary artery disease    Depression    Duodenitis 11/22/2017   Eczema 10/09/2018   Elevated liver enzymes 12/22/2014   Gastritis and gastroduodenitis 09/04/2016   GERD (gastroesophageal reflux disease)    Nondisplaced fracture of neck of fifth metacarpal bone, right hand, initial encounter for closed fracture 08/26/2017   Pancreatitis    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   PTSD (post-traumatic stress disorder)    Tobacco abuse 09/04/2016   Tobacco abuse counseling    Vascular disease     Assessment/Plan: 2 Days Post-Op Procedure(s) (LRB): PERCUTANEOUS FIXATION OF FEMORAL NECK (Left) Active Problems:   COPD (chronic obstructive pulmonary disease) (HCC)   Tobacco abuse counseling   Anxiety   Fracture of femoral neck, left (HCC)   Malnutrition of moderate degree  Estimated body mass index is 18.37 kg/m as calculated from the following:   Height as of this encounter: '5\' 4"'$  (1.626 m).   Weight as of this encounter: 48.5 kg. Advance diet Up with therapy  DVT Prophylaxis -  Plavix PWB LLE 50%  Hgb stable at 11.8 today.  From an orthopaedic standpoint, patient will be ready for discharge home if meeting goals today. If not, tomorrow. D/C when medically stable.  Follow up in our office in 2 weeks. Call to make an appointment.  Dressing is waterproof and will remain in place until follow up.   Griffith Citron, PA-C Orthopedic Surgery [phone] 06/07/2022, 7:48 AM

## 2022-06-11 NOTE — Op Note (Addendum)
NAME: Felicia Perkins, Felicia Perkins MEDICAL RECORD NO: [id number] ACCOUNT NO: [id number] DATE OF BIRTH: 11-01-1975 FACILITY: [facility] LOCATION: [facility] PHYSICIAN: Pietro Cassis. Alvan Dame, MD  Operative Report   DATE OF PROCEDURE: 06/05/2022  PREOPERATIVE DIAGNOSIS:  Nondisplaced left femoral neck fracture.  POSTOPERATIVE DIAGNOSIS:  Nondisplaced left femoral neck fracture.  PROCEDURE:  Closed reduction percutaneous cannulated screw fixation, left femoral neck fracture using [employer] 8.0 mm cannulated screws.  SURGEON:  Pietro Cassis. Alvan Dame, MD  ASSISTANT:  Costella Hatcher, PA-C.  Note, Ms. Lu Duffel was present for the entirety of the case from preoperative positioning, perioperative management of the extremity and primary wound closure during the procedure.  ANESTHESIA:  General.  BLOOD LOSS:  Less than 50 mL.  COMPLICATIONS:  None.  DRAINS:  None.  INDICATIONS FOR PROCEDURE:  The patient is a pleasant 46 year old female who unfortunately had a ground level fall.  The fall happened prior to her admission to the hospital, but due to increasing pain she had, she presented to the ER.  Radiographs  revealed a nondisplaced femoral neck fracture.  This was confirmed with CT scan showing that there was no significant varus collapse and no significant displacement.  Given this, we discussed options of percutaneous cannulated screw fixation as opposed  to arthroplasty.  The risks of malunion, nonunion, and need for future surgeries related to this and/or avascular necrosis were reviewed.  Based on her age, we felt that the percutaneous screw fixation was the best option in addition to the radiographic  appearance of her fracture pattern.  Consent was obtained for benefit of pain relief and management of the fracture.  DESCRIPTION OF PROCEDURE:  The patient was brought to the operative theater.  Once adequate anesthesia, preoperative antibiotics, Ancef administered as well as tranexamic acid, she was positioned supine  on the fracture table.  Once safely positioned and  padded, fluoroscopy was used to confirm that the fracture maintained its anatomic position.  I identified landmarks on her skin.  The left hip was then prepped and draped in sterile fashion.  A timeout was performed identifying the patient, planned  procedure, and extremity.  An incision was made just distal to the level of the lesser trochanter.  Soft tissue dissection was carried through the iliotibial band.  I then used fluoroscopy in both AP and lateral planes to place three guidewires, one into  the central portion inferiorly and 2 proximal pins, anterior and posterior.  Once these were positioned appropriately with good spread and alignment.  I measured the depth and then drilled the outer cortex.  The screws were placed in order of inferior,  then proximal posterior, then proximal anterior.  These were all tightened to her cortex.  The overall fixation of her bone was moderate, I would say.  Once this was completed, final radiographs were obtained in the AP and lateral planes.  The wound was  irrigated with normal saline solution.  I did place two stitches into the iliotibial band.  The remainder of the wound was closed with 2-0 Vicryl and a running Monocryl stitch.  The wound was clean, dry and dressed sterilely using Aquacel dressing.  She  was then brought to the recovery room in stable condition, tolerating the procedure well.  Postoperatively, we will have her be partial weightbearing.  We will follow up with her in 2 weeks to assess for wound, fracture healing and stability.   MUK D: 06/11/2022 6:49:12 am T: 06/11/2022 8:25:00 am  JOB: [id number]/ [id number]

## 2022-06-12 ENCOUNTER — Telehealth: Payer: Self-pay | Admitting: Emergency Medicine

## 2022-06-12 NOTE — Telephone Encounter (Signed)
PCP is currently out of office and will complete paperwork upon return.

## 2022-06-12 NOTE — Telephone Encounter (Signed)
Copied from [facility] [id number]. Topic: General - Inquiry >> Jun 12, 2022 10:56 AM Marcellus Scott wrote: Reason for CRM: Ray from [facility], an [facility] called in to follow up on the certificate of medical necessity. Ray stated he faxed on 10/26 will re-fax today.

## 2022-06-21 ENCOUNTER — Inpatient Hospital Stay: Payer: Medicaid Other | Admitting: Nurse Practitioner

## 2022-07-12 ENCOUNTER — Encounter: Payer: Self-pay | Admitting: Family Medicine

## 2022-07-12 ENCOUNTER — Telehealth: Payer: Self-pay

## 2022-07-12 ENCOUNTER — Ambulatory Visit: Payer: Medicaid Other | Attending: Family Medicine | Admitting: Family Medicine

## 2022-07-12 VITALS — BP 142/91 | HR 98 | Temp 98.1°F | Ht 64.0 in | Wt 100.2 lb

## 2022-07-12 DIAGNOSIS — I10 Essential (primary) hypertension: Secondary | ICD-10-CM | POA: Diagnosis not present

## 2022-07-12 DIAGNOSIS — Z9889 Other specified postprocedural states: Secondary | ICD-10-CM

## 2022-07-12 DIAGNOSIS — Z7902 Long term (current) use of antithrombotics/antiplatelets: Secondary | ICD-10-CM | POA: Insufficient documentation

## 2022-07-12 DIAGNOSIS — Z720 Tobacco use: Secondary | ICD-10-CM | POA: Diagnosis not present

## 2022-07-12 DIAGNOSIS — Z713 Dietary counseling and surveillance: Secondary | ICD-10-CM | POA: Diagnosis not present

## 2022-07-12 DIAGNOSIS — G4709 Other insomnia: Secondary | ICD-10-CM

## 2022-07-12 DIAGNOSIS — Z634 Disappearance and death of family member: Secondary | ICD-10-CM

## 2022-07-12 DIAGNOSIS — J438 Other emphysema: Secondary | ICD-10-CM | POA: Diagnosis not present

## 2022-07-12 DIAGNOSIS — K7030 Alcoholic cirrhosis of liver without ascites: Secondary | ICD-10-CM | POA: Insufficient documentation

## 2022-07-12 DIAGNOSIS — Z23 Encounter for immunization: Secondary | ICD-10-CM

## 2022-07-12 DIAGNOSIS — J449 Chronic obstructive pulmonary disease, unspecified: Secondary | ICD-10-CM | POA: Insufficient documentation

## 2022-07-12 MED ORDER — DULERA 200-5 MCG/ACT IN AERO: 2.0000 | INHALATION_SPRAY | Freq: Every day | RESPIRATORY_TRACT | 3 refills | Status: DC | PRN

## 2022-07-12 MED ORDER — MIRTAZAPINE 15 MG PO TBDP: 15.0000 mg | ORAL_TABLET | Freq: Every day | ORAL | 3 refills | Status: DC

## 2022-07-12 MED ORDER — PANTOPRAZOLE SODIUM 40 MG PO TBEC: 40.0000 mg | DELAYED_RELEASE_TABLET | Freq: Every day | ORAL | 6 refills | Status: DC

## 2022-07-12 MED ORDER — HYDROXYZINE HCL 25 MG PO TABS: 25.0000 mg | ORAL_TABLET | Freq: Three times a day (TID) | ORAL | 3 refills | Status: DC | PRN

## 2022-07-12 NOTE — Progress Notes (Signed)
Subjective:  Patient ID: Felicia Perkins, female    DOB: 03-10-1976  Age: 46 y.o. MRN: [id number]  CC: Hypertension   HPI Felicia Perkins is a 46 y.o. year old female with a history of alcoholic liver cirrhosis, COPD,  R ICA stenosis (s/p R CEA in 01/2021) stenosis alcohol abuse, tobacco abuse, s/p right carotid endarterectomy in 01/2021 due to right ICA stenosis and is currently on Plavix, statin. She was hospitalized last month for closed fracture of the left hip status post percutaneous fixation of left femoral neck.  Interval History:  She has seen Ortho for a follow up and currently ambulates with a walker.  States she is noticing improvement in her range of motion of her left hip.  Last month she lost her daughter. Her daughter was caught in  a shoot out at a gas station and a stray bullet killed her. She Complains of anxiety attacks, insomnia, loss of appetite since her bereavement.  She has not been working since her surgery and is unable to afford some of her medications she is requesting a refill of Protonix.  I have reviewed her med list and she does have refills at the pharmacy. Past Medical History:  Diagnosis Date   Abdominal pain, epigastric 12/16/2014   Acute on chronic pancreatitis ([place]) 03/22/2019   Acute pancreatitis    Alcohol abuse    Alcohol dependence ([place]) 12/16/2012   Medical detox successful    Alcohol induced acute pancreatitis 4/81/8563   Alcoholic cirrhosis of liver without ascites ([place]) 10/15/2016   Anxiety    Bipolar 1 disorder (HCC)    Cancer ([place])    cervical cancer   Chest pain with moderate risk for cardiac etiology 12/15/2014   Cirrhosis ([place])    COPD (chronic obstructive pulmonary disease) ([place]) 09/04/2016   Coronary artery disease    Depression    Duodenitis 11/22/2017   Eczema 10/09/2018   Elevated liver enzymes 12/22/2014   Gastritis and gastroduodenitis 09/04/2016   GERD (gastroesophageal reflux disease)    Nondisplaced fracture of neck of fifth  metacarpal bone, right hand, initial encounter for closed fracture 08/26/2017   Pancreatitis    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   PTSD (post-traumatic stress disorder)    Tobacco abuse 09/04/2016   Tobacco abuse counseling    Vascular disease     Past Surgical History:  Procedure Laterality Date   ABDOMINAL HYSTERECTOMY  2004   BIOPSY  01/02/2018   Procedure: BIOPSY;  Surgeon: Milus Banister, MD;  Location: [facility];  Service: Endoscopy;;   COLONOSCOPY     Done in [place] she thinks. Early 20's. Normal   ENDARTERECTOMY Right 01/26/2021   Procedure: RIGHT CAROTID ARTERY ENDARTERECTOMY;  Surgeon: Waynetta Sandy, MD;  Location: [facility];  Service: Vascular;  Laterality: Right;   ESOPHAGOGASTRODUODENOSCOPY (EGD) WITH PROPOFOL N/A 01/02/2018   Procedure: ESOPHAGOGASTRODUODENOSCOPY (EGD) WITH PROPOFOL;  Surgeon: Milus Banister, MD;  Location: [facility];  Service: Endoscopy;  Laterality: N/A;   EUS N/A 01/02/2018   Procedure: UPPER ENDOSCOPIC ULTRASOUND (EUS) RADIAL;  Surgeon: Milus Banister, MD;  Location: [facility];  Service: Endoscopy;  Laterality: N/A;   FOOT SURGERY     HIP PINNING,CANNULATED Left 06/05/2022   Procedure: PERCUTANEOUS FIXATION OF FEMORAL NECK;  Surgeon: Paralee Cancel, MD;  Location: [facility];  Service: Orthopedics;  Laterality: Left;   OTHER SURGICAL HISTORY     ovarian surgery   TUBAL LIGATION      Family History  Problem Relation Age  of Onset   Hypertension Mother    Breast cancer Mother 9   Pancreatitis Mother    Hypertension Father    Heart attack Cousin 23       died in 63s of MI   Breast cancer Maternal Aunt    Breast cancer Maternal Aunt    Colon cancer Neg Hx    Esophageal cancer Neg Hx    Rectal cancer Neg Hx    Stomach cancer Neg Hx     Social History   Socioeconomic History   Marital status: Single    Spouse name: Not on file   Number of children: 3   Years of education: Not on file   Highest education level: Not on file   Occupational History   Occupation: homemaker  Tobacco Use   Smoking status: Every Day    Packs/day: 0.50    Types: Cigarettes   Smokeless tobacco: Never   Tobacco comments:    2-3 cigarettes daily  Vaping Use   Vaping Use: Never used  Substance and Sexual Activity   Alcohol use: Yes   Drug use: No   Sexual activity: Yes    Birth control/protection: Condom  Other Topics Concern   Not on file  Social History Narrative   Not on file   Social Determinants of Health   Financial Resource Strain: Not on file  Food Insecurity: No Food Insecurity (06/03/2022)   Hunger Vital Sign    Worried About Running Out of Food in the Last Year: Never true    Ran Out of Food in the Last Year: Never true  Transportation Needs: No Transportation Needs (06/03/2022)   PRAPARE - Hydrologist (Medical): No    Lack of Transportation (Non-Medical): No  Physical Activity: Not on file  Stress: Not on file  Social Connections: Not on file    No Known Allergies  Outpatient Medications Prior to Visit  Medication Sig Dispense Refill   albuterol (VENTOLIN HFA) 108 (90 Base) MCG/ACT inhaler Inhale 1-2 puffs into the lungs every 6 (six) hours as needed for wheezing or shortness of breath. 8.5 g 2   clopidogrel (PLAVIX) 75 MG tablet TAKE 1 TABLET BY MOUTH EVERY DAY (Patient taking differently: Take 75 mg by mouth daily.) 30 tablet 11   docusate sodium (COLACE) 100 MG capsule Take 1 capsule (100 mg total) by mouth 2 (two) times daily. 10 capsule 0   famotidine (PEPCID) 20 MG tablet Take 1 tablet (20 mg total) by mouth 2 (two) times daily. 60 tablet 2   methocarbamol (ROBAXIN) 500 MG tablet Take 1 tablet (500 mg total) by mouth every 6 (six) hours as needed for muscle spasms. 40 tablet 2   polyethylene glycol (MIRALAX / GLYCOLAX) 17 g packet Take 17 g by mouth daily. 14 each 0   rosuvastatin (CRESTOR) 10 MG tablet TAKE 1 TABLET BY MOUTH EVERY DAY (Patient taking differently: Take  10 mg by mouth daily.) 30 tablet 11   mometasone-formoterol (DULERA) 200-5 MCG/ACT AERO Inhale 2 puffs into the lungs daily as needed. 13 g 3   pantoprazole (PROTONIX) 40 MG tablet Take 1 tablet (40 mg total) by mouth daily. 30 tablet 0   amLODipine (NORVASC) 2.5 MG tablet Take 1 tablet (2.5 mg total) by mouth daily. 90 tablet 3   cetirizine (ZYRTEC) 10 MG tablet Take 1 tablet (10 mg total) by mouth daily. 30 tablet 1   fluticasone (FLONASE) 50 MCG/ACT nasal spray Place 2 sprays into  both nostrils daily. (Patient not taking: Reported on 07/12/2022) 16 g 6   HYDROcodone-acetaminophen (NORCO/VICODIN) 5-325 MG tablet Take 1 tablet by mouth every 4 (four) hours as needed for severe pain. 42 tablet 0   olopatadine (PATANOL) 0.1 % ophthalmic solution Place 1 drop into both eyes 2 (two) times daily. 5 mL 6   Vitamin D, Ergocalciferol, (DRISDOL) 1.25 MG (50000 UNIT) CAPS capsule Take 1 capsule (50,000 Units total) by mouth every Wednesday. (Patient not taking: Reported on 07/12/2022) 5 capsule 0   No facility-administered medications prior to visit.     ROS Review of Systems  Constitutional:  Positive for appetite change. Negative for activity change.  HENT:  Negative for sinus pressure and sore throat.   Respiratory:  Negative for chest tightness, shortness of breath and wheezing.   Cardiovascular:  Negative for chest pain and palpitations.  Gastrointestinal:  Negative for abdominal distention, abdominal pain and constipation.  Genitourinary: Negative.   Musculoskeletal: Negative.   Psychiatric/Behavioral:  Positive for sleep disturbance. Negative for behavioral problems and dysphoric mood.        Positive for anxiety    Objective:  BP (!) 142/91   Pulse 98   Temp 98.1 F (36.7 C) (Oral)   Ht '5\' 4"'$  (1.626 m)   Wt 100 lb 3.2 oz (45.5 kg)   SpO2 97%   BMI 17.20 kg/m      07/12/2022    9:59 AM 06/07/2022    8:28 AM 06/07/2022    4:54 AM  BP/Weight  Systolic BP 076 226 333   Diastolic BP 91 77 90  Wt. (Lbs) 100.2    BMI 17.2 kg/m2        Physical Exam Constitutional:      Appearance: She is well-developed.  Cardiovascular:     Rate and Rhythm: Normal rate.     Heart sounds: Normal heart sounds. No murmur heard. Pulmonary:     Effort: Pulmonary effort is normal.     Breath sounds: Normal breath sounds. No wheezing or rales.  Chest:     Chest wall: No tenderness.  Abdominal:     General: Bowel sounds are normal. There is no distension.     Palpations: Abdomen is soft. There is no mass.     Tenderness: There is no abdominal tenderness.  Musculoskeletal:        General: Normal range of motion.     Right lower leg: No edema.     Left lower leg: No edema.  Neurological:     Mental Status: She is alert and oriented to person, place, and time.  Psychiatric:     Comments: Dysphoric mood        Latest Ref Rng & Units 06/06/2022    6:29 AM 06/03/2022   10:04 AM 05/04/2022   10:43 AM  CMP  Glucose 70 - 99 mg/dL 112  106  101   BUN 6 - 20 mg/dL 9  <5  6   Creatinine 0.44 - 1.00 mg/dL 0.49  0.56  0.56   Sodium 135 - 145 mmol/L 136  134  139   Potassium 3.5 - 5.1 mmol/L 4.6  3.8  4.2   Chloride 98 - 111 mmol/L 101  98  105   CO2 22 - 32 mmol/L '18  20  24   '$ Calcium 8.9 - 10.3 mg/dL 9.4  9.0  10.0   Total Protein 6.5 - 8.1 g/dL  6.8  8.0   Total Bilirubin 0.3 - 1.2 mg/dL  0.5  0.7   Alkaline Phos 38 - 126 U/L  92  101   AST 15 - 41 U/L  28  26   ALT 0 - 44 U/L  20  11     Lipid Panel     Component Value Date/Time   CHOL 215 (H) 01/24/2022 1054   TRIG 232 (H) 01/24/2022 1054   HDL 79 11/06/2021 1046   CHOLHDL 3.7 03/22/2019 1428   VLDL 18 03/22/2019 1428   LDLCALC 83 11/06/2021 1046    CBC    Component Value Date/Time   WBC 9.5 06/07/2022 0402   RBC 3.57 (L) 06/07/2022 0402   HGB 11.8 (L) 06/07/2022 0402   HGB 12.6 11/06/2021 1046   HCT 33.7 (L) 06/07/2022 0402   HCT 38.7 11/06/2021 1046   PLT 227 06/07/2022 0402   PLT 244  11/06/2021 1046   MCV 94.4 06/07/2022 0402   MCV 101 (H) 11/06/2021 1046   MCH 33.1 06/07/2022 0402   MCHC 35.0 06/07/2022 0402   RDW 13.2 06/07/2022 0402   RDW 13.1 11/06/2021 1046   LYMPHSABS 1.1 06/05/2022 0347   LYMPHSABS 3.2 (H) 11/06/2021 1046   MONOABS 0.6 06/05/2022 0347   EOSABS 0.0 06/05/2022 0347   EOSABS 0.1 11/06/2021 1046   BASOSABS 0.0 06/05/2022 0347   BASOSABS 0.1 11/06/2021 1046    Lab Results  Component Value Date   HGBA1C 4.6 08/25/2019    Assessment & Plan:  1. Other emphysema ([place]) Stable - mometasone-formoterol (DULERA) 200-5 MCG/ACT AERO; Inhale 2 puffs into the lungs daily as needed.  Dispense: 13 g; Refill: 3  2. Alcoholic cirrhosis of liver without ascites ([place]) She states she has quit alcohol - pantoprazole (PROTONIX) 40 MG tablet; Take 1 tablet (40 mg total) by mouth daily.  Dispense: 30 tablet; Refill: 6  3. S/P carotid endarterectomy Risk factor modification including smoking cessation Continue Plavix Followed by cardiology  4. Bereavement Case manager called in to speak with patient and resources provided for grief counseling - hydrOXYzine (ATARAX) 25 MG tablet; Take 1 tablet (25 mg total) by mouth 3 (three) times daily as needed.  Dispense: 60 tablet; Refill: 3  5. Flu vaccine need - Flu Vaccine MDCK QUAD PF  6. 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  Dispense: 30 tablet; Refill: 3    Meds ordered this encounter  Medications   mirtazapine (REMERON SOL-TAB) 15 MG disintegrating tablet    Sig: Take 1 tablet (15 mg total) by mouth at bedtime.    Dispense:  30 tablet    Refill:  3   hydrOXYzine (ATARAX) 25 MG tablet    Sig: Take 1 tablet (25 mg total) by mouth 3 (three) times daily as needed.    Dispense:  60 tablet    Refill:  3   pantoprazole (PROTONIX) 40 MG tablet     Sig: Take 1 tablet (40 mg total) by mouth daily.    Dispense:  30 tablet    Refill:  6   mometasone-formoterol (DULERA) 200-5 MCG/ACT AERO    Sig: Inhale 2 puffs into the lungs daily as needed.    Dispense:  13 g    Refill:  3    DX Code Needed  .    Follow-up: Return in about 3 months (around 10/11/2022) for Chronic medical conditions.  Charlott Rakes, MD, FAAFP. [facility] [place], [place]   07/12/2022, 1:17 PM

## 2022-07-12 NOTE — Patient Instructions (Signed)
Managing Loss, Adult People experience loss in many different ways throughout their lives. Events such as moving, changing jobs, and losing friends can create a sense of loss. The loss may be as serious as a major health change, divorce, death of a pet, or death of a loved one. All of these types of loss are likely to create a physical and emotional reaction known as grief. Grief is the result of a major change or an absence of something or someone that you count on. Grief is a normal reaction to loss. A variety of factors can affect your grieving experience, including: The nature of your loss. Your relationship to what or whom you lost. Your understanding of grief and how to manage it. Your support system. Be aware that when grief becomes extreme, it can lead to more severe issues like isolation, depression, anxiety, or suicidal thoughts. Talk with your health care provider if you have any of these issues. How to manage lifestyle changes Keep to your normal routine as much as possible. If you have trouble focusing or doing normal activities, it is acceptable to take some time away from your normal routine. Spend time with friends and loved ones. Eat a healthy diet, get plenty of sleep, and rest when you feel tired. How to recognize changes  The way that you deal with your grief will affect your ability to function as you normally do. When grieving, you may experience these changes: Numbness, shock, sadness, anxiety, anger, denial, and guilt. Thoughts about death. Unexpected crying. A physical sensation of emptiness in your stomach. Problems sleeping and eating. Tiredness (fatigue). Loss of interest in normal activities. Dreaming about or imagining seeing the person who died. A need to remember what or whom you lost. Difficulty thinking about anything other than your loss for a period of time. Relief. If you have been expecting the loss for a while, you may feel a sense of relief when it  happens. Follow these instructions at home: Activity Express your feelings in healthy ways, such as: Talking with others about your loss. It may be helpful to find others who have had a similar loss, such as a support group. Writing down your feelings in a journal. Doing physical activities to release stress and emotional energy. Doing creative activities like painting, sculpting, or playing or listening to music. Practicing resilience. This is the ability to recover and adjust after facing challenges. Reading some resources that encourage resilience may help you to learn ways to practice those behaviors.  General instructions Be patient with yourself and others. Allow the grieving process to happen, and remember that grieving takes time. It is likely that you may never feel completely done with some grief. You may find a way to move on while still cherishing memories and feelings about your loss. Accepting your loss is a process. It can take months or longer to adjust. Keep all follow-up visits. This is important. Where to find support To get support for managing loss: Ask your health care provider for help and recommendations, such as grief counseling or therapy. Think about joining a support group for people who are managing a loss. Where to find more information You can find more information about managing loss from: American Society of Clinical Oncology: www.cancer.net American Psychological Association: www.apa.org Contact a health care provider if: Your grief is extreme and keeps getting worse. You have ongoing grief that does not improve. Your body shows symptoms of grief, such as illness. You feel depressed, anxious, or   hopeless. Get help right away if: You have thoughts about hurting yourself or others. Get help right away if you feel like you may hurt yourself or others, or have thoughts about taking your own life. Go to your nearest emergency room or: Call 911. Call the  National Suicide Prevention Lifeline at 1-800-273-8255 or 988. This is open 24 hours a day. Text the Crisis Text Line at 741741. Summary Grief is the result of a major change or an absence of someone or something that you count on. Grief is a normal reaction to loss. The depth of grief and the period of recovery depend on the type of loss and your ability to adjust to the change and process your feelings. Processing grief requires patience and a willingness to accept your feelings and talk about your loss with people who are supportive. It is important to find resources that work for you and to realize that people experience grief differently. There is not one grieving process that works for everyone in the same way. Be aware that when grief becomes extreme, it can lead to more severe issues like isolation, depression, anxiety, or suicidal thoughts. Talk with your health care provider if you have any of these issues. This information is not intended to replace advice given to you by your health care provider. Make sure you discuss any questions you have with your health care provider. Document Revised: 03/20/2021 Document Reviewed: 03/20/2021 Elsevier Patient Education  2023 Elsevier Inc.  

## 2022-07-12 NOTE — Telephone Encounter (Signed)
I met with this patient today when she was in the clinic for her appointment with Dr Newlin.  She shared with us that her 47 yo daughter, Calysta [name] Martin, was killed by a stray bullet on 05/27/2022.  the patient is beyond despair grieving this loss. She denied any SI.    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  Her 19 yo daughter lives with her and was working until her sister was killed and then she was not able to return to work and lost her job.    I shared with her information about the grieving process , grief counseling  and services offered by [facility].   We spent a lot of time talking about her memories of her daughter , how her daughter was killed and how she is trying to manage her grief.  She  was very receptive to grief counseling for herself and her daughter and said she was very glad she came to her appointment this morning to be able to talk about her loss and obtain some support.     She resides in Section [place] housing and her rent was $471/month but was increased to $900/month because she and her 19 yo daughter were both working.  Now they have no income and she said she has no money for rent which is due between 12/1/-07/17/2022.  She has been in contact with her Section 8 caseworker because she does not know how she will pay her rent this month.  She has a pending disability application with [employer]. I told her that I would inquire about patient assistance funding for December's rent.  She also provided me with the email for her section 8 caseworker, Robin Clark: [email or web address]. Landlord: Seth Smallwood.  I sent a request to the [facility] [employer] asking for funding for patient's December rent.  This financial assistance would help to relieve some of her anxiety and allow her to focus on grieving and establishing a "new normal " for herself and her family.  

## 2022-07-12 NOTE — Progress Notes (Signed)
Lost daughter last month, not sleeping

## 2022-07-13 NOTE — Telephone Encounter (Signed)
I received a reply from the [facility] with approval to pay December rent for patient.   I tried to contact that patient to share this news with her and to obtain the contact information for her landlord.  I also need to review a Patient Assistance Agreement and obtain her verbal consent.   Message left for her at [phone] with call back requested.

## 2022-07-16 ENCOUNTER — Telehealth: Payer: Self-pay | Admitting: Emergency Medicine

## 2022-07-16 NOTE — Telephone Encounter (Signed)
Copied from [place] [phone]. Topic: General - Other >> Jul 16, 2022  8:06 AM Chapman Fitch wrote: Reason for CRM: Pt was advised to call Opal Sidles today / please advise

## 2022-07-16 NOTE — Telephone Encounter (Signed)
This is great news.  Thank you for your assistance.

## 2022-07-16 NOTE — Telephone Encounter (Signed)
I called the patient and explained to her that she has been approved for payment of her December rent : $900.  She was extremely appreciative. I read her the Patient Assistance Acknowledgement Form and she verbalized acknowledgement and understands that this is a one time grant.  She also provided me with the name and contact number for her landlord: Corky Mull : [phone].   The patient stated that she is continuing to work with her Section 8 caseworker regarding her increased rent.  As she explained, her rent was increased last month from $400 to $900/month because she and her daughter were both working.  Now they are both out of work.  She said that her caseworker is trying to have her rent decreased but they need paperwork documenting that she has been approved for disability.  She filed for disability a few months ago and is awaiting a determination.  I called [facility] and explained that [facility] will pay her December rent.  He said he cannot accept credit card payment, only money order or check.   He noted that the tenants pay their rent from their phones with an app connected to their bank account.  I told him that I would need to inquire how I can submit the payment without using the credit card.  Message sent to [place] requesting advice for submitting payment

## 2022-07-17 NOTE — Telephone Encounter (Signed)
I called the patient this morning and informed her that we are still working on getting her rent paid. I spoke to her landlord and he is not able to accept a credit card payment so we will need to get a money order or check for him.  The patient said she will call him to let him know that she is working with [place] to get her rent paid

## 2022-07-17 NOTE — Telephone Encounter (Signed)
W9 received and sent to [place] Merle, [place] for check request

## 2022-07-17 NOTE — Telephone Encounter (Signed)
I spoke to Corky Mull and informed him that we are working on getting a check for the patient's rent.  We request that the processing of the check be expedited but I do not have a guarantee when that will be.  I explained that the checks are usually cut on Mondays but again, we will request he be expedited. He said he understood and appreciated the update. I also informed him that we need an [place] in order to process the and he said he would email to me.  I also confirmed that the rent is $900.

## 2022-07-18 ENCOUNTER — Inpatient Hospital Stay: Payer: Medicaid Other | Admitting: Family Medicine

## 2022-07-18 NOTE — Telephone Encounter (Signed)
Completed [place] Assistance Acknowledgement Form emailed to Felicia Perkins Felicia Perkins

## 2022-07-18 NOTE — Telephone Encounter (Signed)
We have received the check from [employer] for patient's rent. I called Felicia Perkins/landlord and informed him that I can bring the check to him. He said he is 2 hours away in , [place] , so he said he will come to [facility] 12/7 or 12/8 and pick it up at [facility].

## 2022-07-19 NOTE — Telephone Encounter (Signed)
I called patient and informed her that I have the check for her rent and the landlord said he would pick it up at the end of this week. He did not come today, so he should come tomorrow.   She again was very appreciative of the help.   She said she went to her orthopedic doctor today because  she continues to have problems with her left hip since her surgery. She said he told her that he will watch her for another 6 weeks and decide at that time what intervention if any is needed.

## 2022-07-20 NOTE — Telephone Encounter (Signed)
I spoke to [facility] and he said he was on his way to [facility] to pick up the rent check.  I provided the clinic address for him and informed him that the clinic is closed from 1230-1330.  I also told him that I was not in the clinic this afternoon and he can ask for [place].  He said he understood and was very Patent attorney.

## 2022-07-21 NOTE — Telephone Encounter (Signed)
Per Carilyn Goodpasture, RN, Mr.  Felicia Perkins picked  up the rent check and she verified his identity prior to giving it to him

## 2022-07-31 ENCOUNTER — Other Ambulatory Visit: Payer: Self-pay | Admitting: Cardiology

## 2022-08-17 ENCOUNTER — Emergency Department (HOSPITAL_COMMUNITY): Payer: Medicaid Other

## 2022-08-17 ENCOUNTER — Encounter (HOSPITAL_COMMUNITY): Payer: Self-pay

## 2022-08-17 ENCOUNTER — Emergency Department (HOSPITAL_COMMUNITY)
Admission: EM | Admit: 2022-08-17 | Discharge: 2022-08-17 | Disposition: A | Discharge status: Home / Self Care | Payer: Medicaid Other | Attending: Emergency Medicine | Admitting: Emergency Medicine

## 2022-08-17 ENCOUNTER — Other Ambulatory Visit: Payer: Self-pay

## 2022-08-17 DIAGNOSIS — Z7901 Long term (current) use of anticoagulants: Secondary | ICD-10-CM | POA: Diagnosis not present

## 2022-08-17 DIAGNOSIS — J449 Chronic obstructive pulmonary disease, unspecified: Secondary | ICD-10-CM | POA: Diagnosis not present

## 2022-08-17 DIAGNOSIS — F10188 Alcohol abuse with other alcohol-induced disorder: Secondary | ICD-10-CM | POA: Diagnosis not present

## 2022-08-17 DIAGNOSIS — K860 Alcohol-induced chronic pancreatitis: Secondary | ICD-10-CM | POA: Diagnosis not present

## 2022-08-17 DIAGNOSIS — Z8679 Personal history of other diseases of the circulatory system: Secondary | ICD-10-CM | POA: Insufficient documentation

## 2022-08-17 DIAGNOSIS — Z8541 Personal history of malignant neoplasm of cervix uteri: Secondary | ICD-10-CM | POA: Insufficient documentation

## 2022-08-17 DIAGNOSIS — R112 Nausea with vomiting, unspecified: Secondary | ICD-10-CM | POA: Diagnosis present

## 2022-08-17 LAB — COMPREHENSIVE METABOLIC PANEL
ALT: 69 U/L — ABNORMAL HIGH (ref 0–44)
AST: 56 U/L — ABNORMAL HIGH (ref 15–41)
Albumin: 4.8 g/dL (ref 3.5–5.0)
Alkaline Phosphatase: 103 U/L (ref 38–126)
Anion gap: 11 (ref 5–15)
BUN: 7 mg/dL (ref 6–20)
CO2: 24 mmol/L (ref 22–32)
Calcium: 9.9 mg/dL (ref 8.9–10.3)
Chloride: 103 mmol/L (ref 98–111)
Creatinine, Ser: 0.55 mg/dL (ref 0.44–1.00)
GFR, Estimated: >60 mL/min (ref >60)
Glucose, Bld: 113 mg/dL — ABNORMAL HIGH (ref 70–99)
Potassium: 3.6 mmol/L (ref 3.5–5.1)
Sodium: 138 mmol/L (ref 135–145)
Total Bilirubin: 0.7 mg/dL (ref 0.3–1.2)
Total Protein: 8.2 g/dL — ABNORMAL HIGH (ref 6.5–8.1)

## 2022-08-17 LAB — HCG, QUANTITATIVE, PREGNANCY: hCG, Beta Chain, Quant, S: 3 m[IU]/mL (ref <5)

## 2022-08-17 LAB — CBC
HCT: 43.5 % (ref 36.0–46.0)
Hemoglobin: 15.1 g/dL — ABNORMAL HIGH (ref 12.0–15.0)
MCH: 31.9 pg (ref 26.0–34.0)
MCHC: 34.7 g/dL (ref 30.0–36.0)
MCV: 91.8 fL (ref 80.0–100.0)
Platelets: 281 10*3/uL (ref 150–400)
RBC: 4.74 MIL/uL (ref 3.87–5.11)
RDW: 12.4 % (ref 11.5–15.5)
WBC: 7.5 10*3/uL (ref 4.0–10.5)
nRBC: 0 % (ref 0.0–0.2)

## 2022-08-17 LAB — LIPASE, BLOOD: Lipase: 85 U/L — ABNORMAL HIGH (ref 11–51)

## 2022-08-17 MED ORDER — ONDANSETRON HCL 4 MG PO TABS: 4.0000 mg | ORAL_TABLET | Freq: Four times a day (QID) | ORAL | 0 refills | Status: DC

## 2022-08-17 MED ORDER — ONDANSETRON HCL 4 MG/2ML IJ SOLN
4.0000 mg | Freq: Once | INTRAMUSCULAR | Status: AC
  Administered 2022-08-17: 4 mg via INTRAVENOUS
  Filled 2022-08-17: qty 2

## 2022-08-17 MED ORDER — IOHEXOL 300 MG/ML  SOLN
100.0000 mL | Freq: Once | INTRAMUSCULAR | Status: AC | PRN
  Administered 2022-08-17: 80 mL via INTRAVENOUS

## 2022-08-17 MED ORDER — MORPHINE SULFATE (PF) 4 MG/ML IV SOLN
4.0000 mg | Freq: Once | INTRAVENOUS | Status: AC
  Administered 2022-08-17: 4 mg via INTRAVENOUS
  Filled 2022-08-17: qty 1

## 2022-08-17 MED ORDER — OXYCODONE HCL 5 MG PO TABS: 5.0000 mg | ORAL_TABLET | ORAL | 0 refills | Status: DC | PRN

## 2022-08-17 NOTE — ED Provider Notes (Signed)
[facility] DEPT Provider Note   CSN: [id number] Arrival date & time: 08/17/22  0930     History  Chief Complaint  Patient presents with   Abdominal Pain   Emesis    Felicia Perkins is a 47 y.o. female with history of chronic alcohol induced pancreatitis, alcoholic cirrhosis, COPD, anxiety, bipolar disorder, cervical cancer, CAD, GERD who presents the emergency department complaining of abdominal pain and vomiting.  Patient states that she started feeling nauseous and vomiting last night.  Believes that she has vomited about 4 times.  Developed abdominal pain after waking up around 9 AM this morning.  Noted that she had a bowel movement that was bright green in appearance, prompting her visit to the ER.  She reports similar pain with drinking alcohol previously.  She has been drinking about a 16 ounce of [employer] hard lemonade daily.  States that she was "doing well with drinking" for a while, but reports that her daughter died back in 24-Jun-2023 and she has been struggling with drinking since. She states "I know I need to get my head right".   Abdominal Pain Associated symptoms: chills, nausea and vomiting   Emesis Associated symptoms: abdominal pain and chills        Home Medications Prior to Admission medications   Medication Sig Start Date End Date Taking? Authorizing Provider  ondansetron (ZOFRAN) 4 MG tablet Take 1 tablet (4 mg total) by mouth every 6 (six) hours. 08/17/22  Yes Brodi Nery T, PA-C  oxyCODONE (ROXICODONE) 5 MG immediate release tablet Take 1 tablet (5 mg total) by mouth every 4 (four) hours as needed for severe pain. 08/17/22  Yes Blaire Palomino T, PA-C  albuterol (VENTOLIN HFA) 108 (90 Base) MCG/ACT inhaler Inhale 1-2 puffs into the lungs every 6 (six) hours as needed for wheezing or shortness of breath. 11/01/21   Charlott Rakes, MD  amLODipine (NORVASC) 2.5 MG tablet TAKE 1 TABLET BY MOUTH EVERY DAY 08/01/22   Tobb, Kardie, DO   cetirizine (ZYRTEC) 10 MG tablet Take 1 tablet (10 mg total) by mouth daily. 11/01/21   Charlott Rakes, MD  clopidogrel (PLAVIX) 75 MG tablet TAKE 1 TABLET BY MOUTH EVERY DAY Patient taking differently: Take 75 mg by mouth daily. 11/23/21   Angelia Mould, MD  docusate sodium (COLACE) 100 MG capsule Take 1 capsule (100 mg total) by mouth 2 (two) times daily. 06/07/22   Irving Copas, PA-C  famotidine (PEPCID) 20 MG tablet Take 1 tablet (20 mg total) by mouth 2 (two) times daily. 06/07/22   Darliss Cheney, MD  fluticasone (FLONASE) 50 MCG/ACT nasal spray Place 2 sprays into both nostrils daily. Patient not taking: Reported on 07/12/2022 11/01/21   Charlott Rakes, MD  hydrOXYzine (ATARAX) 25 MG tablet Take 1 tablet (25 mg total) by mouth 3 (three) times daily as needed. 07/12/22   Charlott Rakes, MD  methocarbamol (ROBAXIN) 500 MG tablet Take 1 tablet (500 mg total) by mouth every 6 (six) hours as needed for muscle spasms. 06/07/22   Irving Copas, PA-C  mirtazapine (REMERON SOL-TAB) 15 MG disintegrating tablet Take 1 tablet (15 mg total) by mouth at bedtime. 07/12/22   Charlott Rakes, MD  mometasone-formoterol (DULERA) 200-5 MCG/ACT AERO Inhale 2 puffs into the lungs daily as needed. 07/12/22   Charlott Rakes, MD  olopatadine (PATANOL) 0.1 % ophthalmic solution Place 1 drop into both eyes 2 (two) times daily. 11/01/21   Charlott Rakes, MD  pantoprazole (PROTONIX) 40 MG  tablet Take 1 tablet (40 mg total) by mouth daily. 07/12/22   Charlott Rakes, MD  polyethylene glycol (MIRALAX / GLYCOLAX) 17 g packet Take 17 g by mouth daily. 06/07/22   Irving Copas, PA-C  rosuvastatin (CRESTOR) 10 MG tablet TAKE 1 TABLET BY MOUTH EVERY DAY Patient taking differently: Take 10 mg by mouth daily. 11/21/21   Angelia Mould, MD  Vitamin D, Ergocalciferol, (DRISDOL) 1.25 MG (50000 UNIT) CAPS capsule Take 1 capsule (50,000 Units total) by mouth every Wednesday. Patient not taking: Reported  on 07/12/2022 06/13/22   Darliss Cheney, MD  cloNIDine (CATAPRES) 0.1 MG tablet Take 1 tablet (0.1 mg total) by mouth at bedtime. For hot flashes Patient not taking: Reported on 10/17/2019 01/13/19 08/15/20  Charlott Rakes, MD      Allergies    Patient has no known allergies.    Review of Systems   Review of Systems  Constitutional:  Positive for chills.  Gastrointestinal:  Positive for abdominal pain, nausea and vomiting.  All other systems reviewed and are negative.   Physical Exam Updated Vital Signs BP (!) 120/105   Pulse 69   Temp 98.1 F (36.7 C) (Oral)   Resp 20   Ht '5\' 4"'$  (1.626 m)   Wt 49.9 kg   SpO2 100%   BMI 18.88 kg/m  Physical Exam Vitals and nursing note reviewed.  Constitutional:      Appearance: Normal appearance.  HENT:     Head: Normocephalic and atraumatic.  Eyes:     Conjunctiva/sclera: Conjunctivae normal.  Cardiovascular:     Rate and Rhythm: Normal rate and regular rhythm.  Pulmonary:     Effort: Pulmonary effort is normal. No respiratory distress.     Breath sounds: Normal breath sounds.  Abdominal:     General: There is no distension.     Palpations: Abdomen is soft.     Tenderness: There is abdominal tenderness in the epigastric area.  Skin:    General: Skin is warm and dry.  Neurological:     General: No focal deficit present.     Mental Status: She is alert.     ED Results / Procedures / Treatments   Labs (all labs ordered are listed, but only abnormal results are displayed) Labs Reviewed  LIPASE, BLOOD - Abnormal; Notable for the following components:      Result Value   Lipase 85 (*)    All other components within normal limits  COMPREHENSIVE METABOLIC PANEL - Abnormal; Notable for the following components:   Glucose, Bld 113 (*)    Total Protein 8.2 (*)    AST 56 (*)    ALT 69 (*)    All other components within normal limits  CBC - Abnormal; Notable for the following components:   Hemoglobin 15.1 (*)    All other components  within normal limits  HCG, QUANTITATIVE, PREGNANCY  URINALYSIS, ROUTINE W REFLEX MICROSCOPIC    EKG EKG Interpretation  Date/Time:  Friday August 17 2022 11:10:38 EST Ventricular Rate:  65 PR Interval:  112 QRS Duration: 92 QT Interval:  463 QTC Calculation: 482 R Axis:   -2 Text Interpretation: Sinus rhythm Borderline short PR interval Abnormal R-wave progression, early transition when compard to prior, previous diffuse t wave inversions improved. No STEMI Confirmed by Antony Blackbird [id number]) on 08/17/2022 12:04:55 PM  Radiology CT ABDOMEN PELVIS W CONTRAST  Result Date: 08/17/2022 CLINICAL DATA:  Abdominal pain, acute pancreatitis EXAM: CT ABDOMEN AND PELVIS WITH CONTRAST TECHNIQUE: Multidetector  CT imaging of the abdomen and pelvis was performed using the standard protocol following bolus administration of intravenous contrast. RADIATION DOSE REDUCTION: This exam was performed according to the departmental dose-optimization program which includes automated exposure control, adjustment of the mA and/or kV according to patient size and/or use of iterative reconstruction technique. CONTRAST:  85m OMNIPAQUE IOHEXOL 300 MG/ML  SOLN COMPARISON:  10/24/2020 FINDINGS: Lower chest: Unremarkable. Hepatobiliary: Liver measures 18.6 cm in length. No focal abnormalities are seen. There is no dilation of bile ducts. Gallbladder is unremarkable. Pancreas: There are coarse calcifications in the head of the pancreas with interval increase. There is edema adjacent to the head of the pancreas and duodenum. There is mild dilation of pancreatic duct. There are no loculated fluid collections in or around the pancreas. There is no evidence of focal pancreatic necrosis. Spleen: Unremarkable. Adrenals/Urinary Tract: Adrenals are unremarkable. There is no hydronephrosis. There is 9 mm simple appearing cyst in the posterolateral aspect of upper pole of left kidney. No follow-up imaging is recommended. There are no renal  or ureteral stones. Urinary bladder is not distended. Stomach/Bowel: Stomach is not distended. Small bowel loops are not dilated. There is fluid in the lumen of few small bowel loops without significant wall thickening. Appendix is not seen. There is no focal pericecal inflammation. There is no significant wall thickening in colon. There is incomplete distention of descending and sigmoid colon. Vascular/Lymphatic: Fairly extensive arterial calcifications and atherosclerotic plaques are seen in aorta and its major branches. Atherosclerotic changes appear more pronounced for the patient's age. Reproductive: Unremarkable. Other: There is no significant ascites or pneumoperitoneum. Musculoskeletal: There is previous internal fixation in left femur. IMPRESSION: There are coarse calcifications in the head of the pancreas suggesting chronic pancreatitis. There is edema in the fat planes adjacent to head of the pancreas and proximal duodenum suggesting possible acute pancreatitis and duodenitis. There is no evidence of focal pancreatic necrosis. There are no loculated fluid collections in or around the pancreas. There is no evidence of intestinal obstruction or pneumoperitoneum. There is no hydronephrosis. Aortic atherosclerosis.  Fatty liver. Simple appearing left renal cyst. No follow-up imaging is recommended. Other findings as described in the body of the report. Electronically Signed   By: PElmer PickerM.D.   On: 08/17/2022 12:21    Procedures Procedures    Medications Ordered in ED Medications  morphine (PF) 4 MG/ML injection 4 mg (4 mg Intravenous Given 08/17/22 1040)  ondansetron (ZOFRAN) injection 4 mg (4 mg Intravenous Given 08/17/22 1040)  iohexol (OMNIPAQUE) 300 MG/ML solution 100 mL (80 mLs Intravenous Contrast Given 08/17/22 1201)    ED Course/ Medical Decision Making/ A&P                           Medical Decision Making Amount and/or Complexity of Data Reviewed Labs: ordered. Radiology:  ordered.  Risk Prescription drug management.  This patient is a 47y.o. female  who presents to the ED for concern of abdominal pain and vomiting since last night.   Differential diagnoses prior to evaluation: The emergent differential diagnosis includes, but is not limited to,  ACS/MI, Boerhaave's, DKA, elevated ICP, Ischemic bowel, Sepsis, Drug-related (toxicity, THC hyperemesis, ETOH, withdrawal), Appendicitis, Bowel obstruction, Electrolyte abnormalities, Pancreatitis, Biliary colic, Gastroenteritis, Gastroparesis, Hepatitis, Migraine, Thyroid disease, Renal colic, GERD/PUD, UTI, ovarian torsion. This is not an exhaustive differential.   Past Medical History / Co-morbidities: chronic alcohol induced pancreatitis, alcoholic cirrhosis, COPD, anxiety, bipolar disorder,  cervical cancer, CAD, GERD  Additional history: Chart reviewed. Pertinent results include: Most recent ER visit for symptoms related pancreatitis back in 9/22.  Prior EKG reviewed before anti-emetic medications, normal Qtc.  Physical Exam: Physical exam performed. The pertinent findings include: Normal vital signs, in no acute distress. Abdomen soft, with epigastric tenderness to palpation.   Lab Tests/Imaging studies: I personally interpreted labs/imaging and the pertinent results include:  Hemoglobin 15.1, likely hemo-concentrated. AST and ALT mildly elevated at 56 and 69 respectively. Lipase 85. Negative pregnancy.  CT abdomen pelvis with findings consistent with chronic pancreatitis.  Some edema adjacent to the head of the pancreas which could be consistent with acute pancreatitis or duodenitis, no necrosis or fluid collections.  I agree with the radiologist interpretation.  Cardiac monitoring: EKG obtained and interpreted by my attending physician which shows: Sinus rhythm   Medications: I ordered medication including analgesics and antiemetics.  I have reviewed the patients home medicines and have made adjustments  as needed.  Upon reevaluation patient states that her symptoms have significantly improved and she is ready to go home.  She passed a p.o. challenge while in the department.   Disposition: After consideration of the diagnostic results and the patients response to treatment, I feel that emergency department workup does not suggest an emergent condition requiring admission or immediate intervention beyond what has been performed at this time. The plan is: Discharged home with pain control and antiemetics for mild pancreatitis flare, acute on chronic.  The patient is safe for discharge and has been instructed to return immediately for worsening symptoms, change in symptoms or any other concerns.  Final Clinical Impression(s) / ED Diagnoses Final diagnoses:  Alcohol-induced chronic pancreatitis ([place])    Rx / DC Orders ED Discharge Orders          Ordered    oxyCODONE (ROXICODONE) 5 MG immediate release tablet  Every 4 hours PRN        08/17/22 1338    ondansetron (ZOFRAN) 4 MG tablet  Every 6 hours        08/17/22 1338           Portions of this report may have been transcribed using voice recognition software. Every effort was made to ensure accuracy; however, inadvertent computerized transcription errors may be present.    Estill Cotta 08/17/22 1354    Tegeler, Gwenyth Allegra, MD 08/17/22 [phone]

## 2022-08-17 NOTE — ED Triage Notes (Signed)
Patient c/o LUQ abdominal pain and vomiting. Patient reports a history of pancreatitis. Patient states she has been drinking and the last drink was a 16 ounce [employer] Hard Lemonade.  Patient states she has also had 2 "bright green" bowel movements in the past 2 days.

## 2022-08-17 NOTE — Discharge Instructions (Addendum)
You were seen in the emergency department for abdominal pain and vomiting.  As we discussed, your lab work and imaging looked consistent with chronic pancreatitis. We normally treat this by controlling your pain and nausea. I've sent some medicine for both to your pharmacy.  I've also attached some recommendations for a diet plan. I would stick to clear liquids for the next 24-48 hours and slowly work your way up to a bland diet.  Continue to monitor how you're doing and return to the ER for new or worsening symptoms.

## 2022-09-14 ENCOUNTER — Telehealth: Payer: Self-pay | Admitting: Emergency Medicine

## 2022-09-14 NOTE — Telephone Encounter (Signed)
Copied from [facility]. Topic: General - Other >> Sep 14, 2022 12:13 PM Everette C wrote: Reason for CRM: The patient has called for confirmation that their return to work paperwork was submitted to their employer   The patient is scheduled to resume work on 09/17/22  Please contact the patient further when possible

## 2022-09-14 NOTE — Telephone Encounter (Signed)
Attempted to contact patient and VM was left informing her that paperwork was faxed to employer and form can be picked up from the office.

## 2022-09-25 ENCOUNTER — Other Ambulatory Visit: Payer: Self-pay | Admitting: Family Medicine

## 2022-09-25 DIAGNOSIS — G4709 Other insomnia: Secondary | ICD-10-CM

## 2022-10-04 ENCOUNTER — Encounter: Payer: Self-pay | Admitting: Vascular Surgery

## 2022-10-04 ENCOUNTER — Other Ambulatory Visit: Payer: Self-pay | Admitting: Family Medicine

## 2022-10-04 ENCOUNTER — Encounter: Payer: Self-pay | Admitting: Family Medicine

## 2022-10-04 MED ORDER — ONDANSETRON HCL 4 MG PO TABS: 4.0000 mg | ORAL_TABLET | Freq: Three times a day (TID) | ORAL | 0 refills | Status: DC | PRN

## 2022-10-15 ENCOUNTER — Ambulatory Visit: Payer: Medicaid Other | Admitting: Family Medicine

## 2022-10-18 ENCOUNTER — Encounter: Payer: Self-pay | Admitting: *Deleted

## 2022-10-29 ENCOUNTER — Inpatient Hospital Stay (HOSPITAL_COMMUNITY)
Admission: EM | Admit: 2022-10-29 | Discharge: 2022-11-01 | DRG: 440 | Disposition: A | Discharge status: Hospice | Payer: Medicaid Other | Attending: Internal Medicine | Admitting: Internal Medicine

## 2022-10-29 ENCOUNTER — Encounter (HOSPITAL_COMMUNITY): Payer: Self-pay

## 2022-10-29 ENCOUNTER — Other Ambulatory Visit: Payer: Self-pay

## 2022-10-29 ENCOUNTER — Emergency Department (HOSPITAL_COMMUNITY): Payer: Medicaid Other

## 2022-10-29 DIAGNOSIS — R112 Nausea with vomiting, unspecified: Secondary | ICD-10-CM | POA: Diagnosis present

## 2022-10-29 DIAGNOSIS — Z79899 Other long term (current) drug therapy: Secondary | ICD-10-CM

## 2022-10-29 DIAGNOSIS — F4310 Post-traumatic stress disorder, unspecified: Secondary | ICD-10-CM | POA: Diagnosis present

## 2022-10-29 DIAGNOSIS — Z7902 Long term (current) use of antithrombotics/antiplatelets: Secondary | ICD-10-CM

## 2022-10-29 DIAGNOSIS — I10 Essential (primary) hypertension: Secondary | ICD-10-CM | POA: Diagnosis present

## 2022-10-29 DIAGNOSIS — F419 Anxiety disorder, unspecified: Secondary | ICD-10-CM | POA: Diagnosis present

## 2022-10-29 DIAGNOSIS — Z7951 Long term (current) use of inhaled steroids: Secondary | ICD-10-CM

## 2022-10-29 DIAGNOSIS — Z8541 Personal history of malignant neoplasm of cervix uteri: Secondary | ICD-10-CM

## 2022-10-29 DIAGNOSIS — I2510 Atherosclerotic heart disease of native coronary artery without angina pectoris: Secondary | ICD-10-CM | POA: Diagnosis present

## 2022-10-29 DIAGNOSIS — Z9889 Other specified postprocedural states: Secondary | ICD-10-CM

## 2022-10-29 DIAGNOSIS — F32A Depression, unspecified: Secondary | ICD-10-CM | POA: Diagnosis present

## 2022-10-29 DIAGNOSIS — M25551 Pain in right hip: Secondary | ICD-10-CM | POA: Diagnosis present

## 2022-10-29 DIAGNOSIS — K860 Alcohol-induced chronic pancreatitis: Secondary | ICD-10-CM | POA: Diagnosis present

## 2022-10-29 DIAGNOSIS — Z8249 Family history of ischemic heart disease and other diseases of the circulatory system: Secondary | ICD-10-CM

## 2022-10-29 DIAGNOSIS — Z90710 Acquired absence of both cervix and uterus: Secondary | ICD-10-CM

## 2022-10-29 DIAGNOSIS — E785 Hyperlipidemia, unspecified: Secondary | ICD-10-CM | POA: Diagnosis present

## 2022-10-29 DIAGNOSIS — M25552 Pain in left hip: Secondary | ICD-10-CM | POA: Diagnosis present

## 2022-10-29 DIAGNOSIS — F1290 Cannabis use, unspecified, uncomplicated: Secondary | ICD-10-CM | POA: Diagnosis present

## 2022-10-29 DIAGNOSIS — F17210 Nicotine dependence, cigarettes, uncomplicated: Secondary | ICD-10-CM | POA: Diagnosis present

## 2022-10-29 DIAGNOSIS — Z803 Family history of malignant neoplasm of breast: Secondary | ICD-10-CM

## 2022-10-29 DIAGNOSIS — Z720 Tobacco use: Secondary | ICD-10-CM | POA: Diagnosis present

## 2022-10-29 DIAGNOSIS — K219 Gastro-esophageal reflux disease without esophagitis: Secondary | ICD-10-CM | POA: Diagnosis present

## 2022-10-29 DIAGNOSIS — K5900 Constipation, unspecified: Secondary | ICD-10-CM | POA: Diagnosis present

## 2022-10-29 DIAGNOSIS — F1010 Alcohol abuse, uncomplicated: Secondary | ICD-10-CM | POA: Diagnosis present

## 2022-10-29 DIAGNOSIS — J449 Chronic obstructive pulmonary disease, unspecified: Secondary | ICD-10-CM | POA: Diagnosis present

## 2022-10-29 DIAGNOSIS — K7030 Alcoholic cirrhosis of liver without ascites: Secondary | ICD-10-CM | POA: Diagnosis present

## 2022-10-29 DIAGNOSIS — F319 Bipolar disorder, unspecified: Secondary | ICD-10-CM | POA: Diagnosis present

## 2022-10-29 DIAGNOSIS — K859 Acute pancreatitis without necrosis or infection, unspecified: Principal | ICD-10-CM | POA: Diagnosis present

## 2022-10-29 LAB — URINALYSIS, ROUTINE W REFLEX MICROSCOPIC
Bacteria, UA: NONE SEEN
Bilirubin Urine: NEGATIVE
Glucose, UA: NEGATIVE mg/dL
Hgb urine dipstick: NEGATIVE
Ketones, ur: 5 mg/dL — AB
Leukocytes,Ua: NEGATIVE
Nitrite: NEGATIVE
Protein, ur: 30 mg/dL — AB
Specific Gravity, Urine: 1.017 (ref 1.005–1.030)
pH: 8 (ref 5.0–8.0)

## 2022-10-29 LAB — CBC
HCT: 40.7 % (ref 36.0–46.0)
Hemoglobin: 14.2 g/dL (ref 12.0–15.0)
MCH: 33.1 pg (ref 26.0–34.0)
MCHC: 34.9 g/dL (ref 30.0–36.0)
MCV: 94.9 fL (ref 80.0–100.0)
Platelets: 297 10*3/uL (ref 150–400)
RBC: 4.29 MIL/uL (ref 3.87–5.11)
RDW: 13.3 % (ref 11.5–15.5)
WBC: 8.1 10*3/uL (ref 4.0–10.5)
nRBC: 0 % (ref 0.0–0.2)

## 2022-10-29 LAB — COMPREHENSIVE METABOLIC PANEL
ALT: 20 U/L (ref 0–44)
AST: 26 U/L (ref 15–41)
Albumin: 4.6 g/dL (ref 3.5–5.0)
Alkaline Phosphatase: 131 U/L — ABNORMAL HIGH (ref 38–126)
Anion gap: 7 (ref 5–15)
BUN: 6 mg/dL (ref 6–20)
CO2: 26 mmol/L (ref 22–32)
Calcium: 9.4 mg/dL (ref 8.9–10.3)
Chloride: 100 mmol/L (ref 98–111)
Creatinine, Ser: 0.41 mg/dL — ABNORMAL LOW (ref 0.44–1.00)
GFR, Estimated: >60 mL/min (ref >60)
Glucose, Bld: 126 mg/dL — ABNORMAL HIGH (ref 70–99)
Potassium: 3.9 mmol/L (ref 3.5–5.1)
Sodium: 133 mmol/L — ABNORMAL LOW (ref 135–145)
Total Bilirubin: 0.7 mg/dL (ref 0.3–1.2)
Total Protein: 7.7 g/dL (ref 6.5–8.1)

## 2022-10-29 LAB — LIPASE, BLOOD: Lipase: 68 U/L — ABNORMAL HIGH (ref 11–51)

## 2022-10-29 MED ORDER — IOHEXOL 300 MG/ML  SOLN
100.0000 mL | Freq: Once | INTRAMUSCULAR | Status: AC | PRN
  Administered 2022-10-29: 100 mL via INTRAVENOUS

## 2022-10-29 MED ORDER — ONDANSETRON 4 MG PO TBDP
4.0000 mg | ORAL_TABLET | Freq: Once | ORAL | Status: AC | PRN
  Administered 2022-10-29: 4 mg via ORAL
  Filled 2022-10-29: qty 1

## 2022-10-29 MED ORDER — LACTATED RINGERS IV BOLUS
1000.0000 mL | Freq: Once | INTRAVENOUS | Status: AC
  Administered 2022-10-29: 1000 mL via INTRAVENOUS

## 2022-10-29 MED ORDER — SODIUM CHLORIDE (PF) 0.9 % IJ SOLN
INTRAMUSCULAR | Status: AC
  Filled 2022-10-29: qty 50

## 2022-10-29 NOTE — ED Triage Notes (Signed)
Constipation for a week, no relief with interventions at home. Vomiting every time pt eats or drinks started today. Generalized abdominal pain. Constant nausea.

## 2022-10-29 NOTE — ED Provider Triage Note (Addendum)
Emergency Medicine Provider Triage Evaluation Note  Felicia Perkins , a 47 y.o. female  was evaluated in triage.  She denies history of abdominal surgeries other than abdominal hysterectomy.  She presents for evaluation of abdominal pain starting yesterday.  She states she has had approximately 1 week of constipation.  She has tried prune juice but over the past day is vomiting up anything that she eats or drinks.  Pain is epigastric.  It does radiate to her back.  She has a history of pancreatitis and alcohol dependence.  She has also been taking ibuprofen routinely after recent hip surgery.    Review of Systems  Positive: Abdominal pain, vomiting, constipation Negative: Fever  Physical Exam  BP (!) 151/108   Pulse 83   Temp 98.4 F (36.9 C) (Oral)   Resp 20   Ht 5\' 4"  (1.626 m)   Wt 50.3 kg   SpO2 99%   BMI 19.05 kg/m  Gen:   Awake, appears uncomfortable Resp:  Normal effort  MSK:   Moves extremities without difficulty  Other:    Medical Decision Making  Medically screening exam initiated at 10:07 PM.  Appropriate orders placed.  Felicia Perkins was informed that the remainder of the evaluation will be completed by another provider, this initial triage assessment does not replace that evaluation, and the importance of remaining in the ED until their evaluation is complete.  10:53 PM Reviewed labs, lipase only slightly elevated. CT ordered to r/o intestinal obstruction and ensure no infectious process or bowel perforation.     Carlisle Cater, PA-C 10/29/22 2208    Carlisle Cater, PA-C 10/29/22 2254

## 2022-10-29 NOTE — ED Provider Notes (Signed)
[facility] Provider Note   CSN: [id number] Arrival date & time: 10/29/22  2016     History {Add pertinent medical, surgical, social history, OB history to HPI:1} Chief Complaint  Patient presents with   Abdominal Pain   Emesis    Felicia Perkins is a 47 y.o. female.   Abdominal Pain Associated symptoms: vomiting   Emesis Associated symptoms: abdominal pain        Home Medications Prior to Admission medications   Medication Sig Start Date End Date Taking? Authorizing Provider  albuterol (VENTOLIN HFA) 108 (90 Base) MCG/ACT inhaler Inhale 1-2 puffs into the lungs every 6 (six) hours as needed for wheezing or shortness of breath. 11/01/21   Charlott Rakes, MD  amLODipine (NORVASC) 2.5 MG tablet TAKE 1 TABLET BY MOUTH EVERY DAY 08/01/22   Tobb, Kardie, DO  cetirizine (ZYRTEC) 10 MG tablet Take 1 tablet (10 mg total) by mouth daily. 11/01/21   Charlott Rakes, MD  clopidogrel (PLAVIX) 75 MG tablet TAKE 1 TABLET BY MOUTH EVERY DAY Patient taking differently: Take 75 mg by mouth daily. 11/23/21   Angelia Mould, MD  docusate sodium (COLACE) 100 MG capsule Take 1 capsule (100 mg total) by mouth 2 (two) times daily. 06/07/22   Irving Copas, PA-C  famotidine (PEPCID) 20 MG tablet Take 1 tablet (20 mg total) by mouth 2 (two) times daily. 06/07/22   Darliss Cheney, MD  fluticasone (FLONASE) 50 MCG/ACT nasal spray Place 2 sprays into both nostrils daily. Patient not taking: Reported on 07/12/2022 11/01/21   Charlott Rakes, MD  hydrOXYzine (ATARAX) 25 MG tablet Take 1 tablet (25 mg total) by mouth 3 (three) times daily as needed. 07/12/22   Charlott Rakes, MD  methocarbamol (ROBAXIN) 500 MG tablet Take 1 tablet (500 mg total) by mouth every 6 (six) hours as needed for muscle spasms. 06/07/22   Irving Copas, PA-C  mirtazapine (REMERON SOL-TAB) 15 MG disintegrating tablet TAKE 1 TABLET BY MOUTH EVERYDAY AT BEDTIME 09/25/22    Charlott Rakes, MD  mometasone-formoterol (DULERA) 200-5 MCG/ACT AERO Inhale 2 puffs into the lungs daily as needed. 07/12/22   Charlott Rakes, MD  olopatadine (PATANOL) 0.1 % ophthalmic solution Place 1 drop into both eyes 2 (two) times daily. 11/01/21   Charlott Rakes, MD  ondansetron (ZOFRAN) 4 MG tablet Take 1 tablet (4 mg total) by mouth every 8 (eight) hours as needed for nausea or vomiting. 10/04/22   Charlott Rakes, MD  oxyCODONE (ROXICODONE) 5 MG immediate release tablet Take 1 tablet (5 mg total) by mouth every 4 (four) hours as needed for severe pain. 08/17/22   Roemhildt, Lorin T, PA-C  pantoprazole (PROTONIX) 40 MG tablet Take 1 tablet (40 mg total) by mouth daily. 07/12/22   Charlott Rakes, MD  polyethylene glycol (MIRALAX / GLYCOLAX) 17 g packet Take 17 g by mouth daily. 06/07/22   Irving Copas, PA-C  rosuvastatin (CRESTOR) 10 MG tablet TAKE 1 TABLET BY MOUTH EVERY DAY Patient taking differently: Take 10 mg by mouth daily. 11/21/21   Angelia Mould, MD  Vitamin D, Ergocalciferol, (DRISDOL) 1.25 MG (50000 UNIT) CAPS capsule Take 1 capsule (50,000 Units total) by mouth every Wednesday. Patient not taking: Reported on 07/12/2022 06/13/22   Darliss Cheney, MD  cloNIDine (CATAPRES) 0.1 MG tablet Take 1 tablet (0.1 mg total) by mouth at bedtime. For hot flashes Patient not taking: Reported on 10/17/2019 01/13/19 08/15/20  Charlott Rakes, MD  Allergies    Patient has no known allergies.    Review of Systems   Review of Systems  Gastrointestinal:  Positive for abdominal pain and vomiting.    Physical Exam Updated Vital Signs BP (!) 151/108   Pulse 83   Temp 98.4 F (36.9 C) (Oral)   Resp 20   Ht 5\' 4"  (1.626 m)   Wt 50.3 kg   SpO2 99%   BMI 19.05 kg/m  Physical Exam  ED Results / Procedures / Treatments   Labs (all labs ordered are listed, but only abnormal results are displayed) Labs Reviewed  LIPASE, BLOOD - Abnormal; Notable for the following components:       Result Value   Lipase 68 (*)    All other components within normal limits  COMPREHENSIVE METABOLIC PANEL - Abnormal; Notable for the following components:   Sodium 133 (*)    Glucose, Bld 126 (*)    Creatinine, Ser 0.41 (*)    Alkaline Phosphatase 131 (*)    All other components within normal limits  URINALYSIS, ROUTINE W REFLEX MICROSCOPIC - Abnormal; Notable for the following components:   Ketones, ur 5 (*)    Protein, ur 30 (*)    All other components within normal limits  CBC    EKG None  Radiology No results found.  Procedures Procedures  {Document cardiac monitor, telemetry assessment procedure when appropriate:1}  Medications Ordered in ED Medications  sodium chloride (PF) 0.9 % injection (has no administration in time range)  ondansetron (ZOFRAN-ODT) disintegrating tablet 4 mg (4 mg Oral Given 10/29/22 2054)  iohexol (OMNIPAQUE) 300 MG/ML solution 100 mL (100 mLs Intravenous Contrast Given 10/29/22 2313)    ED Course/ Medical Decision Making/ A&P   {   Click here for ABCD2, HEART and other calculatorsREFRESH Note before signing :1}                          Medical Decision Making Amount and/or Complexity of Data Reviewed Labs: ordered.  Risk Prescription drug management.   ***  {Document critical care time when appropriate:1} {Document review of labs and clinical decision tools ie heart score, Chads2Vasc2 etc:1}  {Document your independent review of radiology images, and any outside records:1} {Document your discussion with family members, caretakers, and with consultants:1} {Document social determinants of health affecting pt's care:1} {Document your decision making why or why not admission, treatments were needed:1} Final Clinical Impression(s) / ED Diagnoses Final diagnoses:  None    Rx / DC Orders ED Discharge Orders     None

## 2022-10-30 ENCOUNTER — Encounter (HOSPITAL_COMMUNITY): Payer: Self-pay | Admitting: Internal Medicine

## 2022-10-30 DIAGNOSIS — Z8541 Personal history of malignant neoplasm of cervix uteri: Secondary | ICD-10-CM | POA: Diagnosis not present

## 2022-10-30 DIAGNOSIS — I10 Essential (primary) hypertension: Secondary | ICD-10-CM

## 2022-10-30 DIAGNOSIS — F4310 Post-traumatic stress disorder, unspecified: Secondary | ICD-10-CM | POA: Diagnosis present

## 2022-10-30 DIAGNOSIS — Z7902 Long term (current) use of antithrombotics/antiplatelets: Secondary | ICD-10-CM | POA: Diagnosis not present

## 2022-10-30 DIAGNOSIS — R112 Nausea with vomiting, unspecified: Secondary | ICD-10-CM

## 2022-10-30 DIAGNOSIS — Z90710 Acquired absence of both cervix and uterus: Secondary | ICD-10-CM | POA: Diagnosis not present

## 2022-10-30 DIAGNOSIS — J438 Other emphysema: Secondary | ICD-10-CM | POA: Diagnosis not present

## 2022-10-30 DIAGNOSIS — E785 Hyperlipidemia, unspecified: Secondary | ICD-10-CM | POA: Diagnosis present

## 2022-10-30 DIAGNOSIS — K860 Alcohol-induced chronic pancreatitis: Secondary | ICD-10-CM | POA: Diagnosis present

## 2022-10-30 DIAGNOSIS — J449 Chronic obstructive pulmonary disease, unspecified: Secondary | ICD-10-CM | POA: Diagnosis present

## 2022-10-30 DIAGNOSIS — Z720 Tobacco use: Secondary | ICD-10-CM

## 2022-10-30 DIAGNOSIS — Z7951 Long term (current) use of inhaled steroids: Secondary | ICD-10-CM | POA: Diagnosis not present

## 2022-10-30 DIAGNOSIS — Z8249 Family history of ischemic heart disease and other diseases of the circulatory system: Secondary | ICD-10-CM | POA: Diagnosis not present

## 2022-10-30 DIAGNOSIS — F1010 Alcohol abuse, uncomplicated: Secondary | ICD-10-CM | POA: Diagnosis present

## 2022-10-30 DIAGNOSIS — F1290 Cannabis use, unspecified, uncomplicated: Secondary | ICD-10-CM | POA: Diagnosis present

## 2022-10-30 DIAGNOSIS — F17210 Nicotine dependence, cigarettes, uncomplicated: Secondary | ICD-10-CM | POA: Diagnosis present

## 2022-10-30 DIAGNOSIS — Z803 Family history of malignant neoplasm of breast: Secondary | ICD-10-CM | POA: Diagnosis not present

## 2022-10-30 DIAGNOSIS — Z9889 Other specified postprocedural states: Secondary | ICD-10-CM

## 2022-10-30 DIAGNOSIS — K7030 Alcoholic cirrhosis of liver without ascites: Secondary | ICD-10-CM | POA: Diagnosis present

## 2022-10-30 DIAGNOSIS — Z79899 Other long term (current) drug therapy: Secondary | ICD-10-CM | POA: Diagnosis not present

## 2022-10-30 DIAGNOSIS — K859 Acute pancreatitis without necrosis or infection, unspecified: Secondary | ICD-10-CM | POA: Diagnosis present

## 2022-10-30 DIAGNOSIS — M25551 Pain in right hip: Secondary | ICD-10-CM | POA: Diagnosis present

## 2022-10-30 DIAGNOSIS — K219 Gastro-esophageal reflux disease without esophagitis: Secondary | ICD-10-CM | POA: Diagnosis present

## 2022-10-30 DIAGNOSIS — I2510 Atherosclerotic heart disease of native coronary artery without angina pectoris: Secondary | ICD-10-CM | POA: Diagnosis present

## 2022-10-30 DIAGNOSIS — F319 Bipolar disorder, unspecified: Secondary | ICD-10-CM | POA: Diagnosis present

## 2022-10-30 DIAGNOSIS — M25552 Pain in left hip: Secondary | ICD-10-CM | POA: Diagnosis present

## 2022-10-30 DIAGNOSIS — F419 Anxiety disorder, unspecified: Secondary | ICD-10-CM | POA: Diagnosis present

## 2022-10-30 DIAGNOSIS — K5900 Constipation, unspecified: Secondary | ICD-10-CM | POA: Diagnosis present

## 2022-10-30 LAB — COMPREHENSIVE METABOLIC PANEL
ALT: 16 U/L (ref 0–44)
AST: 23 U/L (ref 15–41)
Albumin: 4.2 g/dL (ref 3.5–5.0)
Alkaline Phosphatase: 122 U/L (ref 38–126)
Anion gap: 10 (ref 5–15)
BUN: <5 mg/dL — ABNORMAL LOW (ref 6–20)
CO2: 25 mmol/L (ref 22–32)
Calcium: 9.5 mg/dL (ref 8.9–10.3)
Chloride: 101 mmol/L (ref 98–111)
Creatinine, Ser: 0.46 mg/dL (ref 0.44–1.00)
GFR, Estimated: >60 mL/min (ref >60)
Glucose, Bld: 100 mg/dL — ABNORMAL HIGH (ref 70–99)
Potassium: 3.9 mmol/L (ref 3.5–5.1)
Sodium: 136 mmol/L (ref 135–145)
Total Bilirubin: 0.6 mg/dL (ref 0.3–1.2)
Total Protein: 7.4 g/dL (ref 6.5–8.1)

## 2022-10-30 LAB — RAPID URINE DRUG SCREEN, HOSP PERFORMED
Amphetamines: NOT DETECTED
Barbiturates: NOT DETECTED
Benzodiazepines: NOT DETECTED
Cocaine: NOT DETECTED
Opiates: NOT DETECTED
Tetrahydrocannabinol: POSITIVE — AB

## 2022-10-30 LAB — TROPONIN I (HIGH SENSITIVITY): Troponin I (High Sensitivity): 5 ng/L (ref <18)

## 2022-10-30 LAB — MAGNESIUM: Magnesium: 2.2 mg/dL (ref 1.7–2.4)

## 2022-10-30 LAB — LIPASE, BLOOD: Lipase: 98 U/L — ABNORMAL HIGH (ref 11–51)

## 2022-10-30 LAB — ETHANOL: Alcohol, Ethyl (B): <10 mg/dL (ref <10)

## 2022-10-30 MED ORDER — MIRTAZAPINE 15 MG PO TBDP
15.0000 mg | ORAL_TABLET | Freq: Every day | ORAL | Status: DC
  Administered 2022-10-30 – 2022-10-31 (×2): 15 mg via ORAL
  Filled 2022-10-30 (×2): qty 1

## 2022-10-30 MED ORDER — LACTULOSE 10 GM/15ML PO SOLN: 30.0000 g | Freq: Two times a day (BID) | ORAL | Status: DC | PRN

## 2022-10-30 MED ORDER — FAMOTIDINE 20 MG PO TABS
20.0000 mg | ORAL_TABLET | Freq: Two times a day (BID) | ORAL | Status: DC
  Administered 2022-10-30 – 2022-11-01 (×6): 20 mg via ORAL
  Filled 2022-10-30 (×6): qty 1

## 2022-10-30 MED ORDER — METOCLOPRAMIDE HCL 5 MG/ML IJ SOLN
10.0000 mg | Freq: Four times a day (QID) | INTRAMUSCULAR | Status: AC
  Administered 2022-10-30 – 2022-10-31 (×6): 10 mg via INTRAVENOUS
  Filled 2022-10-30 (×6): qty 2

## 2022-10-30 MED ORDER — ALUM & MAG HYDROXIDE-SIMETH 200-200-20 MG/5ML PO SUSP
30.0000 mL | Freq: Once | ORAL | Status: AC
  Administered 2022-10-30: 30 mL via ORAL
  Filled 2022-10-30: qty 30

## 2022-10-30 MED ORDER — MOMETASONE FURO-FORMOTEROL FUM 200-5 MCG/ACT IN AERO
2.0000 | INHALATION_SPRAY | Freq: Two times a day (BID) | RESPIRATORY_TRACT | Status: DC
  Administered 2022-10-30 – 2022-11-01 (×5): 2 via RESPIRATORY_TRACT
  Filled 2022-10-30: qty 8.8

## 2022-10-30 MED ORDER — AMLODIPINE BESYLATE 5 MG PO TABS
2.5000 mg | ORAL_TABLET | Freq: Every day | ORAL | Status: DC
  Administered 2022-10-30 – 2022-11-01 (×3): 2.5 mg via ORAL
  Filled 2022-10-30 (×3): qty 1

## 2022-10-30 MED ORDER — ONDANSETRON HCL 4 MG/2ML IJ SOLN
4.0000 mg | Freq: Four times a day (QID) | INTRAMUSCULAR | Status: DC | PRN
  Administered 2022-10-31: 4 mg via INTRAVENOUS
  Filled 2022-10-30: qty 2

## 2022-10-30 MED ORDER — LACTATED RINGERS IV SOLN: INTRAVENOUS | Status: AC

## 2022-10-30 MED ORDER — CLOPIDOGREL BISULFATE 75 MG PO TABS
75.0000 mg | ORAL_TABLET | Freq: Every day | ORAL | Status: DC
  Administered 2022-10-30 – 2022-11-01 (×3): 75 mg via ORAL
  Filled 2022-10-30 (×3): qty 1

## 2022-10-30 MED ORDER — ACETAMINOPHEN 325 MG PO TABS: 650.0000 mg | ORAL_TABLET | Freq: Four times a day (QID) | ORAL | Status: DC | PRN

## 2022-10-30 MED ORDER — ACETAMINOPHEN 650 MG RE SUPP: 650.0000 mg | Freq: Four times a day (QID) | RECTAL | Status: DC | PRN

## 2022-10-30 MED ORDER — HEPARIN SODIUM (PORCINE) 5000 UNIT/ML IJ SOLN
5000.0000 [IU] | Freq: Three times a day (TID) | INTRAMUSCULAR | Status: DC
  Administered 2022-10-30 – 2022-11-01 (×6): 5000 [IU] via SUBCUTANEOUS
  Filled 2022-10-30 (×7): qty 1

## 2022-10-30 MED ORDER — HYDROMORPHONE HCL 1 MG/ML IJ SOLN
1.0000 mg | INTRAMUSCULAR | Status: DC | PRN
  Administered 2022-10-30: 1 mg via INTRAVENOUS
  Filled 2022-10-30: qty 1

## 2022-10-30 MED ORDER — ROSUVASTATIN CALCIUM 10 MG PO TABS
10.0000 mg | ORAL_TABLET | Freq: Every day | ORAL | Status: DC
  Administered 2022-10-30 – 2022-11-01 (×3): 10 mg via ORAL
  Filled 2022-10-30 (×3): qty 1

## 2022-10-30 MED ORDER — PANTOPRAZOLE SODIUM 40 MG PO TBEC
40.0000 mg | DELAYED_RELEASE_TABLET | Freq: Two times a day (BID) | ORAL | Status: DC
  Administered 2022-10-30 – 2022-11-01 (×5): 40 mg via ORAL
  Filled 2022-10-30 (×5): qty 1

## 2022-10-30 MED ORDER — HYDROXYZINE HCL 25 MG PO TABS
25.0000 mg | ORAL_TABLET | Freq: Three times a day (TID) | ORAL | Status: DC | PRN
  Administered 2022-10-31: 25 mg via ORAL
  Filled 2022-10-30: qty 1

## 2022-10-30 MED ORDER — ONDANSETRON HCL 4 MG/2ML IJ SOLN
4.0000 mg | Freq: Three times a day (TID) | INTRAMUSCULAR | Status: DC | PRN
  Administered 2022-10-30: 4 mg via INTRAVENOUS
  Filled 2022-10-30: qty 2

## 2022-10-30 MED ORDER — HEPARIN SODIUM (PORCINE) 5000 UNIT/ML IJ SOLN: 5000.0000 [IU] | Freq: Three times a day (TID) | INTRAMUSCULAR | Status: DC

## 2022-10-30 MED ORDER — OXYCODONE HCL 5 MG PO TABS
5.0000 mg | ORAL_TABLET | ORAL | Status: DC | PRN
  Administered 2022-10-30 – 2022-11-01 (×9): 5 mg via ORAL
  Filled 2022-10-30 (×9): qty 1

## 2022-10-30 NOTE — ED Notes (Signed)
ED TO INPATIENT HANDOFF REPORT  Name/Age/Gender Felicia Perkins 47 y.o. female  Code Status    Code Status Orders  (From admission, onward)           Start     Ordered   10/30/22 0123  Full code  Continuous       Question:  By:  Answer:  Consent: discussion documented in EHR   10/30/22 0123           Code Status History     Date Active Date Inactive Code Status Order ID Comments User Context   10/30/2022 0105 10/30/2022 0123 Full Code [id number]  Kristopher Oppenheim, DO ED   06/03/2022 1410 06/07/2022 1930 Full Code [id number]  Lequita Halt, MD ED   01/26/2021 1309 01/27/2021 1925 Full Code [id number]  Dagoberto Ligas, PA-C Inpatient   03/22/2019 1355 03/24/2019 2140 Full Code [id number]  Ina Homes, MD ED   11/19/2017 0405 11/22/2017 1958 Full Code [id number]  Vianne Bulls, MD ED   12/15/2014 2223 12/16/2014 1740 Full Code [id number]  Etta Quill, DO ED   01/11/2013 1943 01/12/2013 1415 Full Code [id number]  Marzetta Board, MD ED   12/13/2012 1322 12/16/2012 0427 Full Code [id number]  Clayton Bibles, PA-C ED       Home/SNF/Other Home  Chief Complaint Intractable nausea and vomiting [R11.2]  Level of Care/Admitting Diagnosis ED Disposition     ED Disposition  Admit   Condition  --   [place] [facility]: [facility] [[id number]]  Level of Care: Med-Surg [16]  May place patient in observation at [facility] or [place] if equivalent level of care is available:: No  Covid Evaluation: Asymptomatic - no recent exposure (last 10 days) testing not required  Diagnosis: Intractable nausea and vomiting [id number]  Admitting Physician: Bridgett Larsson, [place]  Attending Physician: Bridgett Larsson, [place]          Medical History Past Medical History:  Diagnosis Date   Acute on chronic pancreatitis ([place]) 03/22/2019   Acute pancreatitis    Alcohol abuse    Alcohol dependence ([place]) 12/16/2012   Medical detox successful    Alcohol induced acute pancreatitis [id number]    Alcoholic cirrhosis of liver without ascites ([place]) 10/15/2016   Anxiety    Aortic atherosclerosis (HCC)    Bipolar 1 disorder (HCC)    Cancer (HCC)    cervical cancer   Chest pain with moderate risk for cardiac etiology 12/15/2014   Cirrhosis ([place])    COPD (chronic obstructive pulmonary disease) ([place]) 09/04/2016   Coronary artery disease    Depression    Duodenitis 11/22/2017   Eczema 10/09/2018   Elevated liver enzymes 12/22/2014   Fracture of femoral neck, left ([place]) 06/04/2022   Gastritis and gastroduodenitis 09/04/2016   GERD (gastroesophageal reflux disease)    Hip fracture ([place]) 06/03/2022   Nondisplaced fracture of neck of fifth metacarpal bone, right hand, initial encounter for closed fracture 08/26/2017   Pancreatitis    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   PTSD (post-traumatic stress disorder)    Tobacco abuse 09/04/2016   Tobacco abuse counseling    Vascular disease     Allergies No Known Allergies  IV Location/Drains/Wounds Patient Lines/Drains/Airways Status     Active Line/Drains/Airways     Name Placement date Placement time Site Days   Peripheral IV 10/29/22 20 G Anterior;Proximal;Right Forearm 10/29/22  2156  Forearm  1            Labs/Imaging Results for orders  placed or performed during the hospital encounter of 10/29/22 (from the past 48 hour(s))  Urinalysis, Routine w reflex microscopic -Urine, Clean Catch     Status: Abnormal   Collection Time: 10/29/22  8:49 PM  Result Value Ref Range   Color, Urine YELLOW YELLOW   APPearance CLEAR CLEAR   Specific Gravity, Urine 1.017 1.005 - 1.030   pH 8.0 5.0 - 8.0   Glucose, UA NEGATIVE NEGATIVE mg/dL   Hgb urine dipstick NEGATIVE NEGATIVE   Bilirubin Urine NEGATIVE NEGATIVE   Ketones, ur 5 (A) NEGATIVE mg/dL   Protein, ur 30 (A) NEGATIVE mg/dL   Nitrite NEGATIVE NEGATIVE   Leukocytes,Ua NEGATIVE NEGATIVE   RBC / HPF 0-5 0 - 5 RBC/hpf   WBC, UA 0-5 0 - 5 WBC/hpf   Bacteria, UA NONE SEEN NONE SEEN    Squamous Epithelial / HPF 0-5 0 - 5 /HPF   Mucus PRESENT     Comment: Performed at [facility], [place] [address], [place], [place] [address]  Rapid urine drug screen (hospital performed)     Status: Abnormal   Collection Time: 10/29/22  8:49 PM  Result Value Ref Range   Opiates NONE DETECTED NONE DETECTED   Cocaine NONE DETECTED NONE DETECTED   Benzodiazepines NONE DETECTED NONE DETECTED   Amphetamines NONE DETECTED NONE DETECTED   Tetrahydrocannabinol POSITIVE (A) NONE DETECTED   Barbiturates NONE DETECTED NONE DETECTED    Comment: (NOTE) DRUG SCREEN FOR MEDICAL PURPOSES ONLY.  IF CONFIRMATION IS NEEDED FOR ANY PURPOSE, NOTIFY LAB WITHIN 5 DAYS.  LOWEST DETECTABLE LIMITS FOR URINE DRUG SCREEN Drug Class                     Cutoff (ng/mL) Amphetamine and metabolites    1000 Barbiturate and metabolites    200 Benzodiazepine                 200 Opiates and metabolites        300 Cocaine and metabolites        300 THC                            50 Performed at [facility], [place] [address] [place], [place] [address]   Lipase, blood     Status: Abnormal   Collection Time: 10/29/22  9:56 PM  Result Value Ref Range   Lipase 68 (H) 11 - 51 U/L    Comment: Performed at [facility], [place] [address], [place], [place] [address]  Comprehensive metabolic panel     Status: Abnormal   Collection Time: 10/29/22  9:56 PM  Result Value Ref Range   Sodium 133 (L) 135 - 145 mmol/L   Potassium 3.9 3.5 - 5.1 mmol/L   Chloride 100 98 - 111 mmol/L   CO2 26 22 - 32 mmol/L   Glucose, Bld 126 (H) 70 - 99 mg/dL    Comment: Glucose reference range applies only to samples taken after fasting for at least 8 hours.   BUN 6 6 - 20 mg/dL   Creatinine, Ser 0.41 (L) 0.44 - 1.00 mg/dL   Calcium 9.4 8.9 - 10.3 mg/dL   Total Protein 7.7 6.5 - 8.1 g/dL   Albumin 4.6 3.5 - 5.0 g/dL   AST 26 15 - 41 U/L   ALT 20 0 - 44 U/L   Alkaline Phosphatase 131  (H) 38 - 126 U/L  Total Bilirubin 0.7 0.3 - 1.2 mg/dL   GFR, Estimated >60 >60 mL/min    Comment: (NOTE) Calculated using the CKD-EPI Creatinine Equation (2021)    Anion gap 7 5 - 15    Comment: Performed at [facility], [place] [address], [place], [place] [address]  CBC     Status: None   Collection Time: 10/29/22  9:56 PM  Result Value Ref Range   WBC 8.1 4.0 - 10.5 K/uL   RBC 4.29 3.87 - 5.11 MIL/uL   Hemoglobin 14.2 12.0 - 15.0 g/dL   HCT 40.7 36.0 - 46.0 %   MCV 94.9 80.0 - 100.0 fL   MCH 33.1 26.0 - 34.0 pg   MCHC 34.9 30.0 - 36.0 g/dL   RDW 13.3 11.5 - 15.5 %   Platelets 297 150 - 400 K/uL   nRBC 0.0 0.0 - 0.2 %    Comment: Performed at [facility], [place] [address] [place], [place] [address]  Troponin I (High Sensitivity)     Status: None   Collection Time: 10/30/22 12:36 AM  Result Value Ref Range   Troponin I (High Sensitivity) 5 <18 ng/L    Comment: (NOTE) Elevated high sensitivity troponin I (hsTnI) values and significant  changes across serial measurements may suggest ACS but many other  chronic and acute conditions are known to elevate hsTnI results.  Refer to the "Links" section for chest pain algorithms and additional  guidance. Performed at [facility], [place] [address] [place], [place] [address]   Ethanol     Status: None   Collection Time: 10/30/22  1:30 AM  Result Value Ref Range   Alcohol, Ethyl (B) <10 <10 mg/dL    Comment: (NOTE) Lowest detectable limit for serum alcohol is 10 mg/dL.  For medical purposes only. Performed at [facility], [place] [address] [place], [place] [address]   Comprehensive metabolic panel     Status: Abnormal   Collection Time: 10/30/22  4:23 AM  Result Value Ref Range   Sodium 136 135 - 145 mmol/L   Potassium 3.9 3.5 - 5.1 mmol/L   Chloride 101 98 - 111 mmol/L   CO2 25 22 - 32 mmol/L   Glucose, Bld 100 (H) 70 - 99 mg/dL    Comment: Glucose  reference range applies only to samples taken after fasting for at least 8 hours.   BUN <5 (L) 6 - 20 mg/dL   Creatinine, Ser 0.46 0.44 - 1.00 mg/dL   Calcium 9.5 8.9 - 10.3 mg/dL   Total Protein 7.4 6.5 - 8.1 g/dL   Albumin 4.2 3.5 - 5.0 g/dL   AST 23 15 - 41 U/L   ALT 16 0 - 44 U/L   Alkaline Phosphatase 122 38 - 126 U/L   Total Bilirubin 0.6 0.3 - 1.2 mg/dL   GFR, Estimated >60 >60 mL/min    Comment: (NOTE) Calculated using the CKD-EPI Creatinine Equation (2021)    Anion gap 10 5 - 15    Comment: Performed at [facility], [place] [address] [place], [place] [address]  Magnesium     Status: None   Collection Time: 10/30/22  4:23 AM  Result Value Ref Range   Magnesium 2.2 1.7 - 2.4 mg/dL    Comment: Performed at [facility], [place] [address], [place], [place] [address]  Lipase, blood     Status: Abnormal   Collection Time: 10/30/22  4:23 AM  Result  Value Ref Range   Lipase 98 (H) 11 - 51 U/L    Comment: Performed at [facility], [place] [address], [place], [place] [address]   CT ABDOMEN PELVIS W CONTRAST  Result Date: 10/29/2022 CLINICAL DATA:  Concern for bowel obstruction. EXAM: CT ABDOMEN AND PELVIS WITH CONTRAST TECHNIQUE: Multidetector CT imaging of the abdomen and pelvis was performed using the standard protocol following bolus administration of intravenous contrast. RADIATION DOSE REDUCTION: This exam was performed according to the departmental dose-optimization program which includes automated exposure control, adjustment of the mA and/or kV according to patient size and/or use of iterative reconstruction technique. CONTRAST:  143mL OMNIPAQUE IOHEXOL 300 MG/ML  SOLN COMPARISON:  CT abdomen pelvis dated 08/17/2022. FINDINGS: Lower chest: The visualized lung bases are clear. No intra-abdominal free air or free fluid. Hepatobiliary: The liver is unremarkable. No biliary ductal dilatation. The gallbladder is unremarkable Pancreas:  There is coarse calcification of the head and uncinate process of the pancreas sequela of chronic pancreatitis. There is mild haziness of the peripancreatic fat which may represent an acute on chronic pancreatitis. Correlation with pancreatic enzymes recommended. No drainable fluid collection/abscess or pseudocyst. There is no dilatation of the main pancreatic duct or gland atrophy. Spleen: Normal in size without focal abnormality. Adrenals/Urinary Tract: The adrenal glands unremarkable. There is no hydronephrosis on either side. Subcentimeter left renal interpolar hypodense focus is too small to characterize. The visualized ureters and urinary bladder appear unremarkable. Stomach/Bowel: There is loose stool throughout the colon consistent with diarrheal state. Correlation with clinical exam and stool cultures recommended. There is no bowel obstruction. The appendix is normal. Vascular/Lymphatic: Moderate aortoiliac atherosclerotic disease. The IVC is unremarkable. No portal venous gas. There is no adenopathy. Reproductive: Hysterectomy.  No adnexal masses. Other: None Musculoskeletal: Left femoral neck fixation screws. No acute osseous pathology. IMPRESSION: 1. Diarrheal state. Correlation with clinical exam and stool cultures recommended. No bowel obstruction. Normal appendix. 2. Sequela of chronic pancreatitis. Mild haziness of the peripancreatic fat may represent an acute on chronic pancreatitis. Correlation with pancreatic enzymes recommended. 3.  Aortic Atherosclerosis (ICD10-I70.0). Electronically Signed   By: Anner Crete M.D.   On: 10/29/2022 23:57    Pending Labs Unresulted Labs (From admission, onward)    None       Vitals/Pain Today's Vitals   10/30/22 0500 10/30/22 0614 10/30/22 0917 10/30/22 0947  BP: 123/82 125/89 (!) 145/90   Pulse: (!) 57 (!) 58 74   Resp: 16 16 18    Temp:  97.9 F (36.6 C) 98 F (36.7 C)   TempSrc:   Oral   SpO2: 95% 99% 100%   Weight:      Height:       PainSc:    6     Isolation Precautions No active isolations  Medications Medications  metoCLOPramide (REGLAN) injection 10 mg (10 mg Intravenous Given 10/30/22 0614)  lactated ringers infusion (0 mLs Intravenous Stopped 10/30/22 0941)  amLODipine (NORVASC) tablet 2.5 mg (2.5 mg Oral Given 10/30/22 0946)  rosuvastatin (CRESTOR) tablet 10 mg (10 mg Oral Given 10/30/22 0946)  mirtazapine (REMERON SOL-TAB) disintegrating tablet 15 mg (has no administration in time range)  hydrOXYzine (ATARAX) tablet 25 mg (has no administration in time range)  famotidine (PEPCID) tablet 20 mg (20 mg Oral Given 10/30/22 0946)  clopidogrel (PLAVIX) tablet 75 mg (75 mg Oral Given 10/30/22 0947)  mometasone-formoterol (DULERA) 200-5 MCG/ACT inhaler 2 puff (2 puffs Inhalation Given 10/30/22 0844)  acetaminophen (TYLENOL) tablet 650 mg (has  no administration in time range)    Or  acetaminophen (TYLENOL) suppository 650 mg (has no administration in time range)  ondansetron (ZOFRAN) injection 4 mg (has no administration in time range)  oxyCODONE (Oxy IR/ROXICODONE) immediate release tablet 5 mg (5 mg Oral Given 10/30/22 0843)  heparin injection 5,000 Units (5,000 Units Subcutaneous Given 10/30/22 0614)  ondansetron (ZOFRAN-ODT) disintegrating tablet 4 mg (4 mg Oral Given 10/29/22 2054)  iohexol (OMNIPAQUE) 300 MG/ML solution 100 mL (100 mLs Intravenous Contrast Given 10/29/22 2313)  lactated ringers bolus 1,000 mL (0 mLs Intravenous Stopped 10/30/22 0135)    Mobility walks

## 2022-10-30 NOTE — Assessment & Plan Note (Signed)
Stable. Continue with norvasc 2.5 mg daily.

## 2022-10-30 NOTE — Assessment & Plan Note (Signed)
Continue with pepcid 20 mg bid.

## 2022-10-30 NOTE — Assessment & Plan Note (Signed)
Pt declines nicotine patch that was offered to her.

## 2022-10-30 NOTE — Assessment & Plan Note (Signed)
Pt still drinking occ etoh despite cirrhosis.

## 2022-10-30 NOTE — Assessment & Plan Note (Signed)
Continue with remeron 15 mg qhs.

## 2022-10-30 NOTE — Hospital Course (Addendum)
Taken from H&P.  47 year old African-American female history of chronic pancreatitis secondary alcohol abuse, alcoholic cirrhosis without ascites, depression, COPD, hypertension, hyperlipidemia presents to the ER today with a 2-day history of worsening intractable nausea vomiting and constipation.  ED course.  Hemodynamically stable, labs pertinent for lipase of 68, alkaline phosphatase 131.  UA negative for UTI. CT abdomen with mild haziness of the Peripancreatic fat and sequela of chronic pancreatitis.  Otherwise pretty much similar to her prior exam done in January 2024.  3/19: Hemodynamically stable.  Labs with little worsening of lipase to 98.  Alkaline phosphatase normalized.  Ethanol undetectable.  UDS positive for tetrahydrocannabinol. Patient continued to experience epigastric pain and nausea.  No vomiting.  Had a small BM. Patient with constipation, she has tried MiraLAX and milk of magnesium at home with no success.  Order some lactulose.

## 2022-10-30 NOTE — Assessment & Plan Note (Signed)
Only drinking 1-2 [employer] Hard Lemonade per week.

## 2022-10-30 NOTE — Assessment & Plan Note (Addendum)
Continue with Plavix 75 mg daily.,  Crestor 10 mg daily.

## 2022-10-30 NOTE — Subjective & Objective (Addendum)
CC: N/V x 2 days, constipation x 7 days HPI: 47 year old African-American female history of chronic pancreatitis secondary alcohol abuse, alcoholic cirrhosis without ascites, depression, COPD, hypertension, hyperlipidemia presents to the ER today with a 2-day history of worsening intractable nausea vomiting, subnasal constipation.  Patient still drinking alcohol about 1 time a week.  Drinks about 2 mikes hard lemonade at a time.  Denies any other illicit substances.  States that she has not had a bowel movement a week.  Drinks some prune juice yesterday which caused her to vomit.  Has been unable to keep anything down due to continuous nausea  Patient states that she has been taking ibuprofen twice a day due to pain in her hips.  She states that she has to stand a lot at work.  She is taking Pepcid twice a day.  Arrival temp 98.4 heart rate 83 blood pressure 151/108 satting 99% on room air..  Labs showed a lipase of 68,  Sodium 133, potassium 3.9, BUN of 6, creatinine 0.4, glucose 126  AST 26 ALT 20 alk phos 131 total bili 0.7  White count 8.1, hemoglobin 14.2, platelets of 297  UA showed a specific gravity 1.017, ketones 5, protein 30, negative leukocyte esterase, negative nitrites  CT scan showed diarrheal state.  No obstruction.  Sequelae of chronic pancreatitis.  Mild haziness of the peripancreatic fat  I have personally compared this to her prior CT from August 17, 2022.  They appear identical as far as her pancreas is concerned.  Due to continued pain, nausea, [facility] hospitalist contacted for admission.

## 2022-10-30 NOTE — Progress Notes (Signed)
No charge progress note.  46 year old African-American female history of chronic pancreatitis secondary alcohol abuse, alcoholic cirrhosis without ascites, depression, COPD, hypertension, hyperlipidemia presents to the ER today with a 2-day history of worsening intractable nausea vomiting and constipation.  ED course.  Hemodynamically stable, labs pertinent for lipase of 68, alkaline phosphatase 131.  UA negative for UTI. CT abdomen with mild haziness of the Peripancreatic fat and sequela of chronic pancreatitis.  Otherwise pretty much similar to her prior exam done in January 2024.  3/19: Hemodynamically stable.  Labs with little worsening of lipase to 98.  Alkaline phosphatase normalized.  Ethanol undetectable.  UDS positive for tetrahydrocannabinol. Patient continued to experience epigastric pain and nausea.  No vomiting.  Had a small BM. Patient with constipation, she has tried MiraLAX and milk of magnesium at home with no success.  Order some lactulose. She was having mild epigastric tenderness on exam.  Ordered GI cocktail and PPI. Counseling was provided for alcohol use and illicit drug.

## 2022-10-30 NOTE — Assessment & Plan Note (Signed)
Observation med/surg bed. Lipase minimally elevated. CT abd/pelvis today similar to CT in Jan 2024. Lipase is lower today than in January. I think her pancreas is always going to look hazy on CT due to her chronic pancreatitis. Start scheduled reglan 10 mg IV q6h for her N/V. Minimize opiates. Oxycodone 5 mg q6h prn. Avoid IV opiates.

## 2022-10-30 NOTE — Assessment & Plan Note (Signed)
Stable. Continue with inhalers.

## 2022-10-30 NOTE — H&P (Signed)
History and Physical    Felicia Perkins [id number] DOB: [birth year] DOA: 10/29/2022  DOS: the patient was seen and examined on 10/29/2022  PCP: Charlott Rakes, MD   Patient coming from: Home  I have personally briefly reviewed patient's old medical records in [place]  CC: N/V x 2 days, constipation x 7 days HPI: 47 year old African-American female history of chronic pancreatitis secondary alcohol abuse, alcoholic cirrhosis without ascites, depression, COPD, hypertension, hyperlipidemia presents to the ER today with a 2-day history of worsening intractable nausea vomiting, subnasal constipation.  Patient still drinking alcohol about 1 time a week.  Drinks about 2 mikes hard lemonade at a time.  Denies any other illicit substances.  States that she has not had a bowel movement a week.  Drinks some prune juice yesterday which caused her to vomit.  Has been unable to keep anything down due to continuous nausea  Patient states that she has been taking ibuprofen twice a day due to pain in her hips.  She states that she has to stand a lot at work.  She is taking Pepcid twice a day.  Arrival temp 98.4 heart rate 83 blood pressure 151/108 satting 99% on room air..  Labs showed a lipase of 68,  Sodium 133, potassium 3.9, BUN of 6, creatinine 0.4, glucose 126  AST 26 ALT 20 alk phos 131 total bili 0.7  White count 8.1, hemoglobin 14.2, platelets of 297  UA showed a specific gravity 1.017, ketones 5, protein 30, negative leukocyte esterase, negative nitrites  CT scan showed diarrheal state.  No obstruction.  Sequelae of chronic pancreatitis.  Mild haziness of the peripancreatic fat  I have personally compared this to her prior CT from August 17, 2022.  They appear identical as far as her pancreas is concerned.  Due to continued pain, nausea, [facility] contacted for admission.   ED Course: lipase 68. CT abd shows chronic pancreatitis. On my read, similar to CT in jan 2024.  She did not require admission to hospital in January.  Review of Systems:  Review of Systems  Constitutional: Negative.   HENT: Negative.    Eyes: Negative.   Respiratory: Negative.    Cardiovascular: Negative.   Gastrointestinal:  Positive for abdominal pain, constipation, nausea and vomiting.  Genitourinary: Negative.   Musculoskeletal:  Positive for joint pain.  Skin: Negative.   Neurological: Negative.   Endo/Heme/Allergies: Negative.   Psychiatric/Behavioral:  Positive for substance abuse.   All other systems reviewed and are negative.   Past Medical History:  Diagnosis Date   Acute on chronic pancreatitis ([place]) 03/22/2019   Acute pancreatitis    Alcohol abuse    Alcohol dependence ([place]) 12/16/2012   Medical detox successful    Alcohol induced acute pancreatitis [id number]   Alcoholic cirrhosis of liver without ascites ([place]) 10/15/2016   Anxiety    Aortic atherosclerosis (HCC)    Bipolar 1 disorder (HCC)    Cancer (HCC)    cervical cancer   Chest pain with moderate risk for cardiac etiology 12/15/2014   Cirrhosis ([place])    COPD (chronic obstructive pulmonary disease) ([place]) 09/04/2016   Coronary artery disease    Depression    Duodenitis 11/22/2017   Eczema 10/09/2018   Elevated liver enzymes 12/22/2014   Fracture of femoral neck, left ([place]) 06/04/2022   Gastritis and gastroduodenitis 09/04/2016   GERD (gastroesophageal reflux disease)    Hip fracture ([place]) 06/03/2022   Nondisplaced fracture of neck of fifth metacarpal bone, right hand,  initial encounter for closed fracture 08/26/2017   Pancreatitis    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   PTSD (post-traumatic stress disorder)    Tobacco abuse 09/04/2016   Tobacco abuse counseling    Vascular disease     Past Surgical History:  Procedure Laterality Date   ABDOMINAL HYSTERECTOMY  2004   BIOPSY  01/02/2018   Procedure: BIOPSY;  Surgeon: Milus Banister, MD;  Location: [facility];  Service: Endoscopy;;    COLONOSCOPY     Done in [place] she thinks. Early 20's. Normal   ENDARTERECTOMY Right 01/26/2021   Procedure: RIGHT CAROTID ARTERY ENDARTERECTOMY;  Surgeon: Waynetta Sandy, MD;  Location: [facility];  Service: Vascular;  Laterality: Right;   ESOPHAGOGASTRODUODENOSCOPY (EGD) WITH PROPOFOL N/A 01/02/2018   Procedure: ESOPHAGOGASTRODUODENOSCOPY (EGD) WITH PROPOFOL;  Surgeon: Milus Banister, MD;  Location: [facility];  Service: Endoscopy;  Laterality: N/A;   EUS N/A 01/02/2018   Procedure: UPPER ENDOSCOPIC ULTRASOUND (EUS) RADIAL;  Surgeon: Milus Banister, MD;  Location: [facility];  Service: Endoscopy;  Laterality: N/A;   FOOT SURGERY     HIP PINNING,CANNULATED Left 06/05/2022   Procedure: PERCUTANEOUS FIXATION OF FEMORAL NECK;  Surgeon: Paralee Cancel, MD;  Location: [facility];  Service: Orthopedics;  Laterality: Left;   OTHER SURGICAL HISTORY     ovarian surgery   TUBAL LIGATION       reports that she has been smoking cigarettes. She has been smoking an average of .25 packs per day. She has never used smokeless tobacco. She reports current alcohol use. She reports that she does not use drugs.  No Known Allergies  Family History  Problem Relation Age of Onset   Hypertension Mother    Breast cancer Mother 48   Pancreatitis Mother    Hypertension Father    Heart attack Cousin 57       died in 16s of MI   Breast cancer Maternal Aunt    Breast cancer Maternal Aunt    Colon cancer Neg Hx    Esophageal cancer Neg Hx    Rectal cancer Neg Hx    Stomach cancer Neg Hx     Prior to Admission medications   Medication Sig Start Date End Date Taking? Authorizing Provider  albuterol (VENTOLIN HFA) 108 (90 Base) MCG/ACT inhaler Inhale 1-2 puffs into the lungs every 6 (six) hours as needed for wheezing or shortness of breath. 11/01/21   Charlott Rakes, MD  amLODipine (NORVASC) 2.5 MG tablet TAKE 1 TABLET BY MOUTH EVERY DAY 08/01/22   Tobb, Kardie, DO  cetirizine (ZYRTEC) 10 MG tablet Take  1 tablet (10 mg total) by mouth daily. 11/01/21   Charlott Rakes, MD  clopidogrel (PLAVIX) 75 MG tablet TAKE 1 TABLET BY MOUTH EVERY DAY Patient taking differently: Take 75 mg by mouth daily. 11/23/21   Angelia Mould, MD  docusate sodium (COLACE) 100 MG capsule Take 1 capsule (100 mg total) by mouth 2 (two) times daily. 06/07/22   Irving Copas, PA-C  famotidine (PEPCID) 20 MG tablet Take 1 tablet (20 mg total) by mouth 2 (two) times daily. 06/07/22   Darliss Cheney, MD  hydrOXYzine (ATARAX) 25 MG tablet Take 1 tablet (25 mg total) by mouth 3 (three) times daily as needed. 07/12/22   Charlott Rakes, MD  mirtazapine (REMERON SOL-TAB) 15 MG disintegrating tablet TAKE 1 TABLET BY MOUTH EVERYDAY AT BEDTIME 09/25/22   Charlott Rakes, MD  mometasone-formoterol (DULERA) 200-5 MCG/ACT AERO Inhale 2 puffs into the lungs daily as needed.  07/12/22   Charlott Rakes, MD  olopatadine (PATANOL) 0.1 % ophthalmic solution Place 1 drop into both eyes 2 (two) times daily. 11/01/21   Charlott Rakes, MD  ondansetron (ZOFRAN) 4 MG tablet Take 1 tablet (4 mg total) by mouth every 8 (eight) hours as needed for nausea or vomiting. 10/04/22   Charlott Rakes, MD  pantoprazole (PROTONIX) 40 MG tablet Take 1 tablet (40 mg total) by mouth daily. 07/12/22   Charlott Rakes, MD  polyethylene glycol (MIRALAX / GLYCOLAX) 17 g packet Take 17 g by mouth daily. 06/07/22   Irving Copas, PA-C  rosuvastatin (CRESTOR) 10 MG tablet TAKE 1 TABLET BY MOUTH EVERY DAY Patient taking differently: Take 10 mg by mouth daily. 11/21/21   Angelia Mould, MD  cloNIDine (CATAPRES) 0.1 MG tablet Take 1 tablet (0.1 mg total) by mouth at bedtime. For hot flashes Patient not taking: Reported on 10/17/2019 01/13/19 08/15/20  Charlott Rakes, MD    Physical Exam: Vitals:   10/29/22 2045 10/29/22 2046 10/29/22 2350  BP: (!) 151/108  (!) 149/89  Pulse: 83  64  Resp: 20  16  Temp: 98.4 F (36.9 C)    TempSrc: Oral    SpO2: 99%  100%   Weight:  50.3 kg   Height:  5\' 4"  (1.626 m)     Physical Exam Vitals and nursing note reviewed.  Constitutional:      General: She is not in acute distress.    Appearance: Normal appearance. She is not ill-appearing, toxic-appearing or diaphoretic.  HENT:     Head: Normocephalic and atraumatic.     Nose: Nose normal.  Cardiovascular:     Rate and Rhythm: Normal rate and regular rhythm.     Pulses: Normal pulses.  Pulmonary:     Effort: Pulmonary effort is normal. No respiratory distress.     Breath sounds: Normal breath sounds.  Abdominal:     General: Abdomen is flat. Bowel sounds are normal. There is no distension.     Tenderness: There is abdominal tenderness in the epigastric area. There is no guarding or rebound.  Musculoskeletal:     Right lower leg: No edema.     Left lower leg: No edema.  Skin:    General: Skin is warm and dry.     Capillary Refill: Capillary refill takes less than 2 seconds.  Neurological:     General: No focal deficit present.     Mental Status: She is alert and oriented to person, place, and time.      Labs on Admission: I have personally reviewed following labs and imaging studies  CBC: Recent Labs  Lab 10/29/22 2156  WBC 8.1  HGB 14.2  HCT 40.7  MCV 94.9  PLT 123XX123   Basic Metabolic Panel: Recent Labs  Lab 10/29/22 2156  NA 133*  K 3.9  CL 100  CO2 26  GLUCOSE 126*  BUN 6  CREATININE 0.41*  CALCIUM 9.4   GFR: Estimated Creatinine Clearance: 69 mL/min (A) (by C-G formula based on SCr of 0.41 mg/dL (L)). Liver Function Tests: Recent Labs  Lab 10/29/22 2156  AST 26  ALT 20  ALKPHOS 131*  BILITOT 0.7  PROT 7.7  ALBUMIN 4.6   Recent Labs  Lab 10/29/22 2156  LIPASE 68*   No results for input(s): "AMMONIA" in the last 168 hours. Coagulation Profile: No results for input(s): "INR", "PROTIME" in the last 168 hours. Cardiac Enzymes: Recent Labs  Lab 10/30/22 0036  TROPONINIHS 5  BNP (last 3 results) No results  for input(s): "BNP" in the last 8760 hours. HbA1C: No results for input(s): "HGBA1C" in the last 72 hours. CBG: No results for input(s): "GLUCAP" in the last 168 hours. Lipid Profile: No results for input(s): "CHOL", "HDL", "LDLCALC", "TRIG", "CHOLHDL", "LDLDIRECT" in the last 72 hours. Thyroid Function Tests: No results for input(s): "TSH", "T4TOTAL", "FREET4", "T3FREE", "THYROIDAB" in the last 72 hours. Anemia Panel: No results for input(s): "VITAMINB12", "FOLATE", "FERRITIN", "TIBC", "IRON", "RETICCTPCT" in the last 72 hours. Urine analysis:    Component Value Date/Time   COLORURINE YELLOW 10/29/2022 2049   APPEARANCEUR CLEAR 10/29/2022 2049   LABSPEC 1.017 10/29/2022 2049   PHURINE 8.0 10/29/2022 2049   GLUCOSEU NEGATIVE 10/29/2022 2049   HGBUR NEGATIVE 10/29/2022 2049   BILIRUBINUR NEGATIVE 10/29/2022 2049   KETONESUR 5 (A) 10/29/2022 2049   PROTEINUR 30 (A) 10/29/2022 2049   UROBILINOGEN 1.0 09/14/2016 1531   NITRITE NEGATIVE 10/29/2022 2049   LEUKOCYTESUR NEGATIVE 10/29/2022 2049    Radiological Exams on Admission: I have personally reviewed images CT ABDOMEN PELVIS W CONTRAST  Result Date: 10/29/2022 CLINICAL DATA:  Concern for bowel obstruction. EXAM: CT ABDOMEN AND PELVIS WITH CONTRAST TECHNIQUE: Multidetector CT imaging of the abdomen and pelvis was performed using the standard protocol following bolus administration of intravenous contrast. RADIATION DOSE REDUCTION: This exam was performed according to the departmental dose-optimization program which includes automated exposure control, adjustment of the mA and/or kV according to patient size and/or use of iterative reconstruction technique. CONTRAST:  172mL OMNIPAQUE IOHEXOL 300 MG/ML  SOLN COMPARISON:  CT abdomen pelvis dated 08/17/2022. FINDINGS: Lower chest: The visualized lung bases are clear. No intra-abdominal free air or free fluid. Hepatobiliary: The liver is unremarkable. No biliary ductal dilatation. The  gallbladder is unremarkable Pancreas: There is coarse calcification of the head and uncinate process of the pancreas sequela of chronic pancreatitis. There is mild haziness of the peripancreatic fat which may represent an acute on chronic pancreatitis. Correlation with pancreatic enzymes recommended. No drainable fluid collection/abscess or pseudocyst. There is no dilatation of the main pancreatic duct or gland atrophy. Spleen: Normal in size without focal abnormality. Adrenals/Urinary Tract: The adrenal glands unremarkable. There is no hydronephrosis on either side. Subcentimeter left renal interpolar hypodense focus is too small to characterize. The visualized ureters and urinary bladder appear unremarkable. Stomach/Bowel: There is loose stool throughout the colon consistent with diarrheal state. Correlation with clinical exam and stool cultures recommended. There is no bowel obstruction. The appendix is normal. Vascular/Lymphatic: Moderate aortoiliac atherosclerotic disease. The IVC is unremarkable. No portal venous gas. There is no adenopathy. Reproductive: Hysterectomy.  No adnexal masses. Other: None Musculoskeletal: Left femoral neck fixation screws. No acute osseous pathology. IMPRESSION: 1. Diarrheal state. Correlation with clinical exam and stool cultures recommended. No bowel obstruction. Normal appendix. 2. Sequela of chronic pancreatitis. Mild haziness of the peripancreatic fat may represent an acute on chronic pancreatitis. Correlation with pancreatic enzymes recommended. 3.  Aortic Atherosclerosis (ICD10-I70.0). Electronically Signed   By: Anner Crete M.D.   On: 10/29/2022 23:57    EKG: My personal interpretation of EKG shows: NSR    Assessment/Plan Principal Problem:   Intractable nausea and vomiting Active Problems:   Alcohol abuse   Tobacco abuse   COPD (chronic obstructive pulmonary disease) (HCC)   Alcoholic cirrhosis of liver without ascites (HCC)   GERD (gastroesophageal  reflux disease)   Depression   Essential (primary) hypertension   History of carotid endarterectomy    Assessment  and Plan: * Intractable nausea and vomiting Observation med/surg bed. Lipase minimally elevated. CT abd/pelvis today similar to CT in Jan 2024. Lipase is lower today than in January. I think her pancreas is always going to look hazy on CT due to her chronic pancreatitis. Start scheduled reglan 10 mg IV q6h for her N/V. Minimize opiates. Oxycodone 5 mg q6h prn. Avoid IV opiates.  History of carotid endarterectomy Continue with Plavix 75 mg daily.,  Crestor 10 mg daily.  Essential (primary) hypertension Stable. Continue with norvasc 2.5 mg daily.  Depression Continue with remeron 15 mg qhs.  GERD (gastroesophageal reflux disease) Continue with pepcid 20 mg bid.  Alcoholic cirrhosis of liver without ascites (HCC) Pt still drinking occ etoh despite cirrhosis.  COPD (chronic obstructive pulmonary disease) (HCC) Stable. Continue with inhalers.  Tobacco abuse Pt declines nicotine patch that was offered to her.  Alcohol abuse Only drinking 1-2 [employer] Hard Lemonade per week.    DVT prophylaxis: SQ Heparin Code Status: Full Code Family Communication: no family at bedside  Disposition Plan: return home  Consults called: none  Admission status: Observation, Med-Surg   Kristopher Oppenheim, DO [facility] 10/30/2022, 1:18 AM

## 2022-10-31 DIAGNOSIS — K859 Acute pancreatitis without necrosis or infection, unspecified: Secondary | ICD-10-CM

## 2022-10-31 DIAGNOSIS — R112 Nausea with vomiting, unspecified: Secondary | ICD-10-CM | POA: Diagnosis not present

## 2022-10-31 DIAGNOSIS — K7030 Alcoholic cirrhosis of liver without ascites: Secondary | ICD-10-CM | POA: Diagnosis not present

## 2022-10-31 DIAGNOSIS — F1010 Alcohol abuse, uncomplicated: Secondary | ICD-10-CM | POA: Diagnosis not present

## 2022-10-31 LAB — BASIC METABOLIC PANEL
Anion gap: 10 (ref 5–15)
BUN: 7 mg/dL (ref 6–20)
CO2: 26 mmol/L (ref 22–32)
Calcium: 9.4 mg/dL (ref 8.9–10.3)
Chloride: 100 mmol/L (ref 98–111)
Creatinine, Ser: 0.59 mg/dL (ref 0.44–1.00)
GFR, Estimated: >60 mL/min (ref >60)
Glucose, Bld: 96 mg/dL (ref 70–99)
Potassium: 4.3 mmol/L (ref 3.5–5.1)
Sodium: 136 mmol/L (ref 135–145)

## 2022-10-31 LAB — CBC
HCT: 37.9 % (ref 36.0–46.0)
Hemoglobin: 13 g/dL (ref 12.0–15.0)
MCH: 33.4 pg (ref 26.0–34.0)
MCHC: 34.3 g/dL (ref 30.0–36.0)
MCV: 97.4 fL (ref 80.0–100.0)
Platelets: 211 10*3/uL (ref 150–400)
RBC: 3.89 MIL/uL (ref 3.87–5.11)
RDW: 13.2 % (ref 11.5–15.5)
WBC: 7.2 10*3/uL (ref 4.0–10.5)
nRBC: 0 % (ref 0.0–0.2)

## 2022-10-31 LAB — LIPASE, BLOOD: Lipase: 55 U/L — ABNORMAL HIGH (ref 11–51)

## 2022-10-31 MED ORDER — THIAMINE MONONITRATE 100 MG PO TABS
100.0000 mg | ORAL_TABLET | Freq: Every day | ORAL | Status: DC
  Administered 2022-10-31 – 2022-11-01 (×2): 100 mg via ORAL
  Filled 2022-10-31 (×2): qty 1

## 2022-10-31 MED ORDER — LACTATED RINGERS IV SOLN: INTRAVENOUS | Status: DC

## 2022-10-31 MED ORDER — LORAZEPAM 1 MG PO TABS: 1.0000 mg | ORAL_TABLET | ORAL | Status: DC | PRN

## 2022-10-31 MED ORDER — SENNOSIDES-DOCUSATE SODIUM 8.6-50 MG PO TABS
1.0000 | ORAL_TABLET | Freq: Two times a day (BID) | ORAL | Status: DC
  Administered 2022-10-31 – 2022-11-01 (×3): 1 via ORAL
  Filled 2022-10-31 (×3): qty 1

## 2022-10-31 MED ORDER — POLYETHYLENE GLYCOL 3350 17 G PO PACK
17.0000 g | PACK | Freq: Every day | ORAL | Status: DC
  Administered 2022-10-31 – 2022-11-01 (×2): 17 g via ORAL
  Filled 2022-10-31 (×2): qty 1

## 2022-10-31 MED ORDER — ADULT MULTIVITAMIN W/MINERALS CH
1.0000 | ORAL_TABLET | Freq: Every day | ORAL | Status: DC
  Administered 2022-10-31 – 2022-11-01 (×2): 1 via ORAL
  Filled 2022-10-31 (×2): qty 1

## 2022-10-31 MED ORDER — FOLIC ACID 1 MG PO TABS
1.0000 mg | ORAL_TABLET | Freq: Every day | ORAL | Status: DC
  Administered 2022-10-31 – 2022-11-01 (×2): 1 mg via ORAL
  Filled 2022-10-31 (×2): qty 1

## 2022-10-31 MED ORDER — THIAMINE HCL 100 MG/ML IJ SOLN: 100.0000 mg | Freq: Every day | INTRAMUSCULAR | Status: DC

## 2022-10-31 NOTE — Progress Notes (Signed)
Mobility Specialist - Progress Note   10/31/22 1204  Mobility  Activity Ambulated with assistance in hallway  Level of Assistance Modified independent, requires aide device or extra time  Assistive Device None  Distance Ambulated (ft) 500 ft  Activity Response Tolerated well  Mobility Referral Yes  $Mobility charge 1 Mobility   Pt received in bed and agreed to mobility. Pt had no c/o pain nor discomfort during session. Pt returned to chair with all needs met.  Roderick Pee Mobility Specialist

## 2022-10-31 NOTE — Progress Notes (Signed)
[facility]                                                                              Felicia Perkins, is a 47 y.o. female, DOB - 1976/03/13, [id number] Admit date - 10/29/2022    Outpatient Primary MD for the patient is Charlott Rakes, MD  LOS - 1  days  Chief Complaint  Patient presents with   Abdominal Pain   Emesis       Brief summary   Patient is a 47 year old female with history of chronic pancreatitis secondary to alcohol use, alcoholic cirrhosis without ascites, depression, COPD, HTN, HLP, presented to ED with 2-day history of worsening intractable nausea and vomiting, unable to hold anything down.   CT abdomen showed sequelae of chronic pancreatitis, mild haziness of the peripancreatic fat negative send and acute on chronic pancreatitis.  Assessment & Plan    Principal Problem:   Acute on chronic pancreatitis, intractable nausea and vomiting -Placed on IV fluid hydration, pain control -Counseled on alcohol cessation -Currently no active vomiting, starting on clear liquid diet -Continue antiemetics as needed  Active Problems:    Alcohol abuse -Currently stable, no acute alcohol withdrawal symptoms -Placed on CIWA protocol with Ativan, thiamine, folate    Tobacco abuse, THC use -UDS showed THC, possibility of intractable nausea and vomiting secondary to THC use -Nausea vomiting currently improving    COPD (chronic obstructive pulmonary disease) (HCC) -Currently stable, no wheezing    Alcoholic cirrhosis of liver without ascites (HCC) -Stable, no acute issues    GERD (gastroesophageal reflux disease) -Continue Protonix    Depression -Continue Remeron    Essential (primary) hypertension -Continue amlodipine   History of carotid enterectomy -Continue Plavix, statin   Code Status: Full code DVT Prophylaxis:  heparin injection 5,000 Units Start: 10/30/22 0600 SCDs Start: 10/30/22 0141   Level of Care: Level of care:  Med-Surg Family Communication: Updated patient' Disposition Plan:      Remains inpatient appropriate: Hopefully DC home tomorrow if tolerating solid diet with no worsening nausea or vomiting   Procedures:    Consultants:     Antimicrobials: None   Medications  amLODipine  2.5 mg Oral Daily   clopidogrel  75 mg Oral Daily   famotidine  20 mg Oral BID   heparin  5,000 Units Subcutaneous Q8H   mirtazapine  15 mg Oral QHS   mometasone-formoterol  2 puff Inhalation BID   pantoprazole  40 mg Oral BID   polyethylene glycol  17 g Oral Daily   rosuvastatin  10 mg Oral Daily   senna-docusate  1 tablet Oral BID      Subjective:   Felicia Perkins was seen and examined today.  Reported some nausea, abdominal pain 4-5/10 in the epigastric region radiating to back.  No chest pain, fevers, shortness of breath.  No active vomiting.   No acute events overnight.    Objective:   Vitals:   10/31/22 0019 10/31/22 0601 10/31/22 0838 10/31/22 0958  BP: 120/87 125/89  122/86  Pulse: 64 66    Resp: 16 20    Temp: 98.4 F (36.9 C) 98.1 F (  36.7 C)    TempSrc: Oral Oral    SpO2: 100% 100% 99%   Weight:      Height:        Intake/Output Summary (Last 24 hours) at 10/31/2022 1047 Last data filed at 10/31/2022 1006 Gross per 24 hour  Intake 120 ml  Output --  Net 120 ml     Wt Readings from Last 3 Encounters:  10/29/22 50.3 kg  08/17/22 49.9 kg  07/12/22 45.5 kg     Exam General: Alert and oriented x 3, NAD Cardiovascular: S1 S2 auscultated,  RRR Respiratory: Clear to auscultation bilaterally, no wheezing Gastrointestinal: Soft, epigastric TTP, ND, NBS Ext: no pedal edema bilaterally Neuro: no new deficits Skin: No rashes Psych: Normal affect and demeanor, alert and oriented x3     Data Reviewed:  I have personally reviewed following labs    CBC Lab Results  Component Value Date   WBC 7.2 10/31/2022   RBC 3.89 10/31/2022   HGB 13.0 10/31/2022   HCT 37.9 10/31/2022    MCV 97.4 10/31/2022   MCH 33.4 10/31/2022   PLT 211 10/31/2022   MCHC 34.3 10/31/2022   RDW 13.2 10/31/2022   LYMPHSABS 1.1 06/05/2022   MONOABS 0.6 06/05/2022   EOSABS 0.0 06/05/2022   BASOSABS 0.0 [id number]     Last metabolic panel Lab Results  Component Value Date   NA 136 10/31/2022   K 4.3 10/31/2022   CL 100 10/31/2022   CO2 26 10/31/2022   BUN 7 10/31/2022   CREATININE 0.59 10/31/2022   GLUCOSE 96 10/31/2022   GFRNONAA >60 10/31/2022   GFRAA >60 05/13/2020   CALCIUM 9.4 10/31/2022   PHOS 3.5 03/23/2019   PROT 7.4 10/30/2022   ALBUMIN 4.2 10/30/2022   LABGLOB 2.5 11/06/2021   AGRATIO 1.9 11/06/2021   BILITOT 0.6 10/30/2022   ALKPHOS 122 10/30/2022   AST 23 10/30/2022   ALT 16 10/30/2022   ANIONGAP 10 10/31/2022    CBG (last 3)  No results for input(s): "GLUCAP" in the last 72 hours.    Coagulation Profile: No results for input(s): "INR", "PROTIME" in the last 168 hours.   Radiology Studies: I have personally reviewed the imaging studies  CT ABDOMEN PELVIS W CONTRAST  Result Date: 10/29/2022 CLINICAL DATA:  Concern for bowel obstruction. EXAM: CT ABDOMEN AND PELVIS WITH CONTRAST TECHNIQUE: Multidetector CT imaging of the abdomen and pelvis was performed using the standard protocol following bolus administration of intravenous contrast. RADIATION DOSE REDUCTION: This exam was performed according to the departmental dose-optimization program which includes automated exposure control, adjustment of the mA and/or kV according to patient size and/or use of iterative reconstruction technique. CONTRAST:  166mL OMNIPAQUE IOHEXOL 300 MG/ML  SOLN COMPARISON:  CT abdomen pelvis dated 08/17/2022. FINDINGS: Lower chest: The visualized lung bases are clear. No intra-abdominal free air or free fluid. Hepatobiliary: The liver is unremarkable. No biliary ductal dilatation. The gallbladder is unremarkable Pancreas: There is coarse calcification of the head and uncinate process  of the pancreas sequela of chronic pancreatitis. There is mild haziness of the peripancreatic fat which may represent an acute on chronic pancreatitis. Correlation with pancreatic enzymes recommended. No drainable fluid collection/abscess or pseudocyst. There is no dilatation of the main pancreatic duct or gland atrophy. Spleen: Normal in size without focal abnormality. Adrenals/Urinary Tract: The adrenal glands unremarkable. There is no hydronephrosis on either side. Subcentimeter left renal interpolar hypodense focus is too small to characterize. The visualized ureters and urinary bladder appear  unremarkable. Stomach/Bowel: There is loose stool throughout the colon consistent with diarrheal state. Correlation with clinical exam and stool cultures recommended. There is no bowel obstruction. The appendix is normal. Vascular/Lymphatic: Moderate aortoiliac atherosclerotic disease. The IVC is unremarkable. No portal venous gas. There is no adenopathy. Reproductive: Hysterectomy.  No adnexal masses. Other: None Musculoskeletal: Left femoral neck fixation screws. No acute osseous pathology. IMPRESSION: 1. Diarrheal state. Correlation with clinical exam and stool cultures recommended. No bowel obstruction. Normal appendix. 2. Sequela of chronic pancreatitis. Mild haziness of the peripancreatic fat may represent an acute on chronic pancreatitis. Correlation with pancreatic enzymes recommended. 3.  Aortic Atherosclerosis (ICD10-I70.0). Electronically Signed   By: Anner Crete M.D.   On: 10/29/2022 23:57       Isahia Hollerbach M.D. [facility] 10/31/2022, 10:47 AM  Available via Epic secure chat 7am-7pm After 7 pm, please refer to night coverage provider listed on amion.

## 2022-10-31 NOTE — TOC Initial Note (Signed)
Transition of Care [facility]) - Initial/Assessment Note    Patient Details  Name: Felicia Perkins MRN: [id number] Date of Birth: 1975/09/19  Transition of Care [facility]) CM/SW Contact:    Illene Regulus, LCSW Phone Number: 10/31/2022, 2:07 PM  Clinical Narrative:                 CSW received consult regarding substance abuse resource , pt declined . No additional TOC needs . TOC sign off.    Expected Discharge Plan: Home w Hospice Care Barriers to Discharge: Continued Medical Work up   Patient Goals and CMS Choice Patient states their goals for this hospitalization and ongoing recovery are:: return home          Expected Discharge Plan and Services       Living arrangements for the past 2 months: Single Family Home                                      Prior Living Arrangements/Services Living arrangements for the past 2 months: Single Family Home Lives with:: Significant Other, Self Patient language and need for interpreter reviewed:: Yes Do you feel safe going back to the place where you live?: Yes      Need for Family Participation in Patient Care: No (Comment) Care giver support system in place?: No (comment)   Criminal Activity/Legal Involvement Pertinent to Current Situation/Hospitalization: No - Comment as needed  Activities of Daily Living Home Assistive Devices/Equipment: None ADL Screening (condition at time of admission) Patient's cognitive ability adequate to safely complete daily activities?: Yes Is the patient deaf or have difficulty hearing?: No Does the patient have difficulty seeing, even when wearing glasses/contacts?: Yes Does the patient have difficulty concentrating, remembering, or making decisions?: No Patient able to express need for assistance with ADLs?: No Does the patient have difficulty dressing or bathing?: No Independently performs ADLs?: Yes (appropriate for developmental age) Communication: Independent Dressing (OT):  Independent Grooming: Independent Feeding: Independent Bathing: Independent Toileting: Independent In/Out Bed: Independent Walks in Home: Independent Does the patient have difficulty walking or climbing stairs?: Yes Weakness of Legs: None Weakness of Arms/Hands: None  Permission Sought/Granted                  Emotional Assessment Appearance:: Appears stated age Attitude/Demeanor/Rapport: Gracious Affect (typically observed): Accepting Orientation: : Oriented to Self, Oriented to Place, Oriented to  Time, Oriented to Situation Alcohol / Substance Use: Illicit Drugs, Alcohol Use Psych Involvement: No (comment)  Admission diagnosis:  Intractable nausea and vomiting [R11.2] Acute pancreatitis, unspecified complication status, unspecified pancreatitis type [K85.90] Patient Active Problem List   Diagnosis Date Noted   Intractable nausea and vomiting 10/30/2022   Malnutrition of moderate degree 06/05/2022   Essential (primary) hypertension 03/16/2021   Smoker 03/16/2021   History of carotid endarterectomy 03/16/2021   Coronary artery disease 03/02/2021   Depression 03/02/2021   Carotid artery stenosis 01/26/2021   Anxiety    Cancer (HCC)    Cirrhosis (HCC)    GERD (gastroesophageal reflux disease)    PTSD (post-traumatic stress disorder)    Vascular disease    Bipolar 1 disorder ([place]) 01/13/2019   Eczema 10/09/2018   Acute pancreatitis    Tobacco abuse counseling    Plantar fibromatosis [id number]   Alcoholic cirrhosis of liver without ascites ([place]) 10/15/2016   Alcohol abuse 09/04/2016   Tobacco abuse 09/04/2016   COPD (chronic obstructive  pulmonary disease) ([place]) 09/04/2016   Alcohol dependence ([place]) 12/16/2012    Class: Acute   PCP:  Charlott Rakes, MD Pharmacy:   [facility] [place], [place] [address] Phone: [phone] Fax: [phone]  [place] [address] [place] [address] Phone: [phone] Fax: [phone]  [id number] [place], [place] [id number] [place] [id number] Phone: [phone] Fax: [phone]     Social Determinants of Health (SDOH) Social History: [place]: No Food Insecurity (10/30/2022)  Housing: Low Risk  (10/30/2022)  Transportation Needs: No Transportation Needs (10/30/2022)  Utilities: Not At Risk (10/30/2022)  Depression (PHQ2-9): High Risk (07/12/2022)  Tobacco Use: High Risk (10/30/2022)   SDOH Interventions: Housing Interventions: Intervention Not Indicated   Readmission Risk Interventions     No data to display

## 2022-11-01 DIAGNOSIS — I10 Essential (primary) hypertension: Secondary | ICD-10-CM | POA: Diagnosis not present

## 2022-11-01 DIAGNOSIS — K7030 Alcoholic cirrhosis of liver without ascites: Secondary | ICD-10-CM | POA: Diagnosis not present

## 2022-11-01 DIAGNOSIS — K859 Acute pancreatitis without necrosis or infection, unspecified: Secondary | ICD-10-CM | POA: Diagnosis not present

## 2022-11-01 DIAGNOSIS — F1010 Alcohol abuse, uncomplicated: Secondary | ICD-10-CM | POA: Diagnosis not present

## 2022-11-01 LAB — BASIC METABOLIC PANEL
Anion gap: 7 (ref 5–15)
BUN: <5 mg/dL — ABNORMAL LOW (ref 6–20)
CO2: 26 mmol/L (ref 22–32)
Calcium: 9.4 mg/dL (ref 8.9–10.3)
Chloride: 103 mmol/L (ref 98–111)
Creatinine, Ser: 0.49 mg/dL (ref 0.44–1.00)
GFR, Estimated: >60 mL/min (ref >60)
Glucose, Bld: 101 mg/dL — ABNORMAL HIGH (ref 70–99)
Potassium: 4.5 mmol/L (ref 3.5–5.1)
Sodium: 136 mmol/L (ref 135–145)

## 2022-11-01 LAB — LIPASE, BLOOD: Lipase: 43 U/L (ref 11–51)

## 2022-11-01 MED ORDER — VITAMIN B-1 100 MG PO TABS: 100.0000 mg | ORAL_TABLET | Freq: Every day | ORAL | 0 refills | Status: DC

## 2022-11-01 MED ORDER — ONDANSETRON HCL 4 MG PO TABS: 4.0000 mg | ORAL_TABLET | Freq: Three times a day (TID) | ORAL | 0 refills | Status: DC | PRN

## 2022-11-01 MED ORDER — FOLIC ACID 1 MG PO TABS: 1.0000 mg | ORAL_TABLET | Freq: Every day | ORAL | 0 refills | Status: DC

## 2022-11-01 MED ORDER — OXYCODONE HCL 5 MG PO TABS: 5.0000 mg | ORAL_TABLET | Freq: Four times a day (QID) | ORAL | 0 refills | Status: DC | PRN

## 2022-11-01 MED ORDER — PANTOPRAZOLE SODIUM 40 MG PO TBEC: 40.0000 mg | DELAYED_RELEASE_TABLET | Freq: Every day | ORAL | 6 refills | Status: DC

## 2022-11-01 NOTE — Discharge Summary (Signed)
Physician Discharge Summary   Patient: Felicia Perkins MRN: [id number] DOB: 1976-03-24  Admit date:     10/29/2022  Discharge date: 11/01/22  Discharge Physician: Estill Cotta, MD    PCP: Charlott Rakes, MD   Recommendations at discharge:   Patient recommended to quit drinking alcohol.  Discharge Diagnoses:    Acute on chronic pancreatitis   Intractable nausea and vomiting   Alcohol abuse   Tobacco abuse   COPD (chronic obstructive pulmonary disease) (HCC)   Alcoholic cirrhosis of liver without ascites (HCC)   GERD (gastroesophageal reflux disease)   Depression   Essential (primary) hypertension   History of carotid endarterectomy    Hospital Course:  Patient is a 47 year old female with history of chronic pancreatitis secondary to alcohol use, alcoholic cirrhosis without ascites, depression, COPD, HTN, HLP, presented to ED with 2-day history of worsening intractable nausea and vomiting, unable to hold anything down.   CT abdomen showed sequelae of chronic pancreatitis, mild haziness of the peripancreatic fat may represent acute on chronic pancreatitis.   Assessment and Plan:   Acute on chronic pancreatitis, intractable nausea and vomiting -Patient was placed on pain control, IV fluid hydration.  Counseled on alcohol cessation.   -CT abdomen showed sequelae of chronic pancreatitis with mild haziness of the peripancreatic fat may represent acute on chronic pancreatitis Placed on IV fluid hydration, pain control -Lipase 98 on admission, improved, 43 on discharge, tolerating solid diet without difficulty      Alcohol abuse -Placed on CIWA protocol with Ativan, thiamine, folate -Currently no acute withdrawals.  Counseled on alcohol cessation.     Tobacco abuse, THC use -UDS showed THC, possibility of intractable nausea and vomiting secondary to THC use -Nausea vomiting improved,  tolerating diet     COPD (chronic obstructive pulmonary disease) (HCC) -Currently  stable, no wheezing     Alcoholic cirrhosis of liver without ascites (HCC) -Stable, no acute issues     GERD (gastroesophageal reflux disease) -Continue Protonix     Depression -Continue Remeron     Essential (primary) hypertension -Continue amlodipine     History of carotid enterectomy -Continue Plavix, statin     Pain control - [place] Controlled Substance Reporting System database was reviewed. and patient was instructed, not to drive, operate heavy machinery, perform activities at heights, swimming or participation in water activities or provide baby-sitting services while on Pain, Sleep and Anxiety Medications; until their outpatient Physician has advised to do so again. Also recommended to not to take more than prescribed Pain, Sleep and Anxiety Medications.  Consultants:  Procedures performed:   Disposition: Home Diet recommendation:  Discharge Diet Orders (From admission, onward)     Start     Ordered   11/01/22 0000  Diet - low sodium heart healthy        11/01/22 0958           Cardiac diet DISCHARGE MEDICATION: Allergies as of 11/01/2022   No Known Allergies      Medication List     TAKE these medications    albuterol 108 (90 Base) MCG/ACT inhaler Commonly known as: VENTOLIN HFA Inhale 1-2 puffs into the lungs every 6 (six) hours as needed for wheezing or shortness of breath.   alum & mag hydroxide-simeth [id number] MG/5ML suspension Commonly known as: MAALOX PLUS Take 15 mLs by mouth every 6 (six) hours as needed for indigestion.   amLODipine 2.5 MG tablet Commonly known as: NORVASC TAKE 1 TABLET BY MOUTH EVERY DAY  cetirizine 10 MG tablet Commonly known as: ZYRTEC Take 1 tablet (10 mg total) by mouth daily.   clopidogrel 75 MG tablet Commonly known as: PLAVIX TAKE 1 TABLET BY MOUTH EVERY DAY   docusate sodium 100 MG capsule Commonly known as: COLACE Take 1 capsule (100 mg total) by mouth 2 (two) times daily. What changed:   when to take this reasons to take this   Dulera 200-5 MCG/ACT Aero Generic drug: mometasone-formoterol Inhale 2 puffs into the lungs daily as needed. What changed: reasons to take this   ergocalciferol 1.25 MG (50000 UT) capsule Commonly known as: VITAMIN D2 Take 50,000 Units by mouth once a week.   famotidine 20 MG tablet Commonly known as: PEPCID Take 1 tablet (20 mg total) by mouth 2 (two) times daily.   folic acid 1 MG tablet Commonly known as: FOLVITE Take 1 tablet (1 mg total) by mouth daily. Start taking on: November 02, 2022   hydrOXYzine 25 MG tablet Commonly known as: ATARAX Take 1 tablet (25 mg total) by mouth 3 (three) times daily as needed. What changed: reasons to take this   mirtazapine 15 MG disintegrating tablet Commonly known as: REMERON SOL-TAB TAKE 1 TABLET BY MOUTH EVERYDAY AT BEDTIME   olopatadine 0.1 % ophthalmic solution Commonly known as: Patanol Place 1 drop into both eyes 2 (two) times daily.   ondansetron 4 MG tablet Commonly known as: ZOFRAN Take 1 tablet (4 mg total) by mouth every 8 (eight) hours as needed for nausea or vomiting.   OVER THE COUNTER MEDICATION Take 1 capsule by mouth daily. [place]   oxyCODONE 5 MG immediate release tablet Commonly known as: Oxy IR/ROXICODONE Take 1 tablet (5 mg total) by mouth every 6 (six) hours as needed for moderate pain or severe pain.   pantoprazole 40 MG tablet Commonly known as: PROTONIX Take 1 tablet (40 mg total) by mouth daily.   polyethylene glycol 17 g packet Commonly known as: MIRALAX / GLYCOLAX Take 17 g by mouth daily. What changed:  when to take this reasons to take this   rosuvastatin 10 MG tablet Commonly known as: CRESTOR TAKE 1 TABLET BY MOUTH EVERY DAY   thiamine 100 MG tablet Commonly known as: Vitamin B-1 Take 1 tablet (100 mg total) by mouth daily. Start taking on: November 02, 2022        Follow-up Information     Charlott Rakes, MD. Schedule an appointment as  soon as possible for a visit in 2 week(s).   Specialty: Family Medicine Why: for hospital follow-up Contact information: [place]  [phone]                Discharge Exam: Danley Danker Weights   10/29/22 2046 11/01/22 0500  Weight: 50.3 kg 52.4 kg   S: No acute complaints, feeling a lot better today, tolerating solid diet.  BP 107/75 (BP Location: Right Arm)   Pulse 65   Temp 98 F (36.7 C) (Oral)   Resp 17   Ht 5\' 4"  (1.626 m)   Wt 52.4 kg   SpO2 99%   BMI 19.85 kg/m    Physical Exam General: Alert and oriented x 3, NAD Cardiovascular: S1 S2 clear, RRR.  Respiratory: CTAB, no wheezing, rales or rhonchi Gastrointestinal: Soft, nontender, nondistended, NBS Ext: no pedal edema bilaterally Neuro: no new deficits Psych: Normal affect and demeanor, alert and oriented x3    Condition at discharge: fair  The results of significant  diagnostics from this hospitalization (including imaging, microbiology, ancillary and laboratory) are listed below for reference.   Imaging Studies: CT ABDOMEN PELVIS W CONTRAST  Result Date: 10/29/2022 CLINICAL DATA:  Concern for bowel obstruction. EXAM: CT ABDOMEN AND PELVIS WITH CONTRAST TECHNIQUE: Multidetector CT imaging of the abdomen and pelvis was performed using the standard protocol following bolus administration of intravenous contrast. RADIATION DOSE REDUCTION: This exam was performed according to the departmental dose-optimization program which includes automated exposure control, adjustment of the mA and/or kV according to patient size and/or use of iterative reconstruction technique. CONTRAST:  15mL OMNIPAQUE IOHEXOL 300 MG/ML  SOLN COMPARISON:  CT abdomen pelvis dated 08/17/2022. FINDINGS: Lower chest: The visualized lung bases are clear. No intra-abdominal free air or free fluid. Hepatobiliary: The liver is unremarkable. No biliary ductal dilatation. The gallbladder is unremarkable  Pancreas: There is coarse calcification of the head and uncinate process of the pancreas sequela of chronic pancreatitis. There is mild haziness of the peripancreatic fat which may represent an acute on chronic pancreatitis. Correlation with pancreatic enzymes recommended. No drainable fluid collection/abscess or pseudocyst. There is no dilatation of the main pancreatic duct or gland atrophy. Spleen: Normal in size without focal abnormality. Adrenals/Urinary Tract: The adrenal glands unremarkable. There is no hydronephrosis on either side. Subcentimeter left renal interpolar hypodense focus is too small to characterize. The visualized ureters and urinary bladder appear unremarkable. Stomach/Bowel: There is loose stool throughout the colon consistent with diarrheal state. Correlation with clinical exam and stool cultures recommended. There is no bowel obstruction. The appendix is normal. Vascular/Lymphatic: Moderate aortoiliac atherosclerotic disease. The IVC is unremarkable. No portal venous gas. There is no adenopathy. Reproductive: Hysterectomy.  No adnexal masses. Other: None Musculoskeletal: Left femoral neck fixation screws. No acute osseous pathology. IMPRESSION: 1. Diarrheal state. Correlation with clinical exam and stool cultures recommended. No bowel obstruction. Normal appendix. 2. Sequela of chronic pancreatitis. Mild haziness of the peripancreatic fat may represent an acute on chronic pancreatitis. Correlation with pancreatic enzymes recommended. 3.  Aortic Atherosclerosis (ICD10-I70.0). Electronically Signed   By: Anner Crete M.D.   On: 10/29/2022 23:57    Microbiology: Results for orders placed or performed during the hospital encounter of 06/03/22  Surgical PCR screen     Status: None   Collection Time: 06/04/22  5:54 AM   Specimen: Nasal Mucosa; Nasal Swab  Result Value Ref Range Status   MRSA, PCR NEGATIVE NEGATIVE Final   Staphylococcus aureus NEGATIVE NEGATIVE Final    Comment:  (NOTE) The Xpert SA Assay (FDA approved for NASAL specimens in patients 92 years of age and older), is one component of a comprehensive surveillance program. It is not intended to diagnose infection nor to guide or monitor treatment. Performed at [facility], [place] [address] [place], [place] [address]     Labs: CBC: Recent Labs  Lab 10/29/22 2156 10/31/22 0637  WBC 8.1 7.2  HGB 14.2 13.0  HCT 40.7 37.9  MCV 94.9 97.4  PLT 297 [id number]   Basic Metabolic Panel: Recent Labs  Lab 10/29/22 2156 10/30/22 0423 10/31/22 0637 11/01/22 0529  NA 133* 136 136 136  K 3.9 3.9 4.3 4.5  CL 100 101 100 103  CO2 26 25 26 26   GLUCOSE 126* 100* 96 101*  BUN 6 <5* 7 <5*  CREATININE 0.41* 0.46 0.59 0.49  CALCIUM 9.4 9.5 9.4 9.4  MG  --  2.2  --   --    Liver Function Tests: Recent Labs  Lab  10/29/22 2156 10/30/22 0423  AST 26 23  ALT 20 16  ALKPHOS 131* 122  BILITOT 0.7 0.6  PROT 7.7 7.4  ALBUMIN 4.6 4.2   CBG: No results for input(s): "GLUCAP" in the last 168 hours.  Discharge time spent: greater than 30 minutes.  Signed: Estill Cotta, MD [facility] 11/01/2022

## 2022-11-05 ENCOUNTER — Telehealth: Payer: Self-pay

## 2022-11-05 NOTE — Telephone Encounter (Signed)
From the discharge summary:  She said she is feeling better. She has returned to work at [employer]. 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  She said her rent is currently $0. the landlord is working with her until she gets back on her feet and she is very Patent attorney of that. She said it is still having a  very hard time trying to cope with the loss of her other daughter.She has not sought any grief counseling yet. she wanted to go with her daughter but her daughter did not want to go  Her 65 yo daughter recently moved out of her house to live with her boyfriend; but  she comes to visit her every day.   She has all of her medications and did not have any questions about the med regime She stated that she is now trying to drink Ensure instead of alcohol.    Follow-up Provider: Dr Margarita Rana - 12/04/2022.

## 2022-11-05 NOTE — Transitions of Care (Post Inpatient/ED Visit) (Signed)
   11/05/2022  Name: Felicia Perkins MRN: [id number] DOB: 08-Apr-1976  Today's TOC FU Call Status: Today's TOC FU Call Status:: Successful TOC FU Call Competed TOC FU Call Complete Date: 11/05/22  Transition Care Management Follow-up Telephone Call Date of Discharge: 11/01/22 Discharge Facility: [facility]) Type of Discharge: Inpatient Admission Primary Inpatient Discharge Diagnosis:: acute pancreatitis How have you been since you were released from the hospital?: Better (She said she has returned to work at [employer] 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 she continues to have hip pain.) Any questions or concerns?: Yes (She said her rent is currently $0.  the landlord is working with her until she gets back on her feet and she is very Patent attorney.  She has not sought any grief counseling yet. she wanted to go with her daughter but her daughter did not want to go.) Patient Questions/Concerns:: She said her rent is currently $52. the landlord is working with her until she gets back on her feet and she is very Patent attorney of that. She said it is still having a  very hard time trying to cope with the loss of her other daughter.She has not sought any grief counseling yet. she wanted to go with her daughter but her daughter did not want to go Patient Questions/Concerns Addressed: Notified Provider of Patient Questions/Concerns  Items Reviewed: Did you receive and understand the discharge instructions provided?: Yes Medications obtained and verified?: Yes (Medications Reviewed) (She said she has all of her medications and she did not need to review the med list and she said she did not have any questions about the meds.) Any new allergies since your discharge?: No Dietary orders reviewed?: Yes Type of Diet Ordered:: heart healthy.  she stated that she is now trying to drink Ensure instead of alcohol. Do you have support at  home?: Yes People in Home: child(ren), adult Name of Support/Comfort Primary Source: Her 48 yo daughter recently moved out of her house to live with her boyfriend; but  she comes to visit her every day.  Home Care and Equipment/Supplies: Were Home Health Services Ordered?: No Any new equipment or medical supplies ordered?: No  Functional Questionnaire: Do you need assistance with bathing/showering or dressing?: No Do you need assistance with meal preparation?: No Do you need assistance with eating?: No Do you have difficulty maintaining continence: No Do you need assistance with getting out of bed/getting out of a chair/moving?: No Do you have difficulty managing or taking your medications?: No  Follow up appointments reviewed: PCP Follow-up appointment confirmed?: Yes Date of PCP follow-up appointment?: 12/04/22 Follow-up Provider: [facility] Follow-up appointment confirmed?: Yes Date of Specialist follow-up appointment?: 11/20/22 Follow-Up Specialty Provider:: GI Do you need transportation to your follow-up appointment?: No Do you understand care options if your condition(s) worsen?: Yes-patient verbalized understanding    SIGNATURE Eden Lathe, RN

## 2022-11-12 IMAGING — DX DG CHEST 2V
2 series · 2 of 2 positions shown · non-contrast
Comparison: February 28, 2020

CLINICAL DATA: Shortness of breath and cough

EXAM:
CHEST - 2 VIEW

[chest pa]
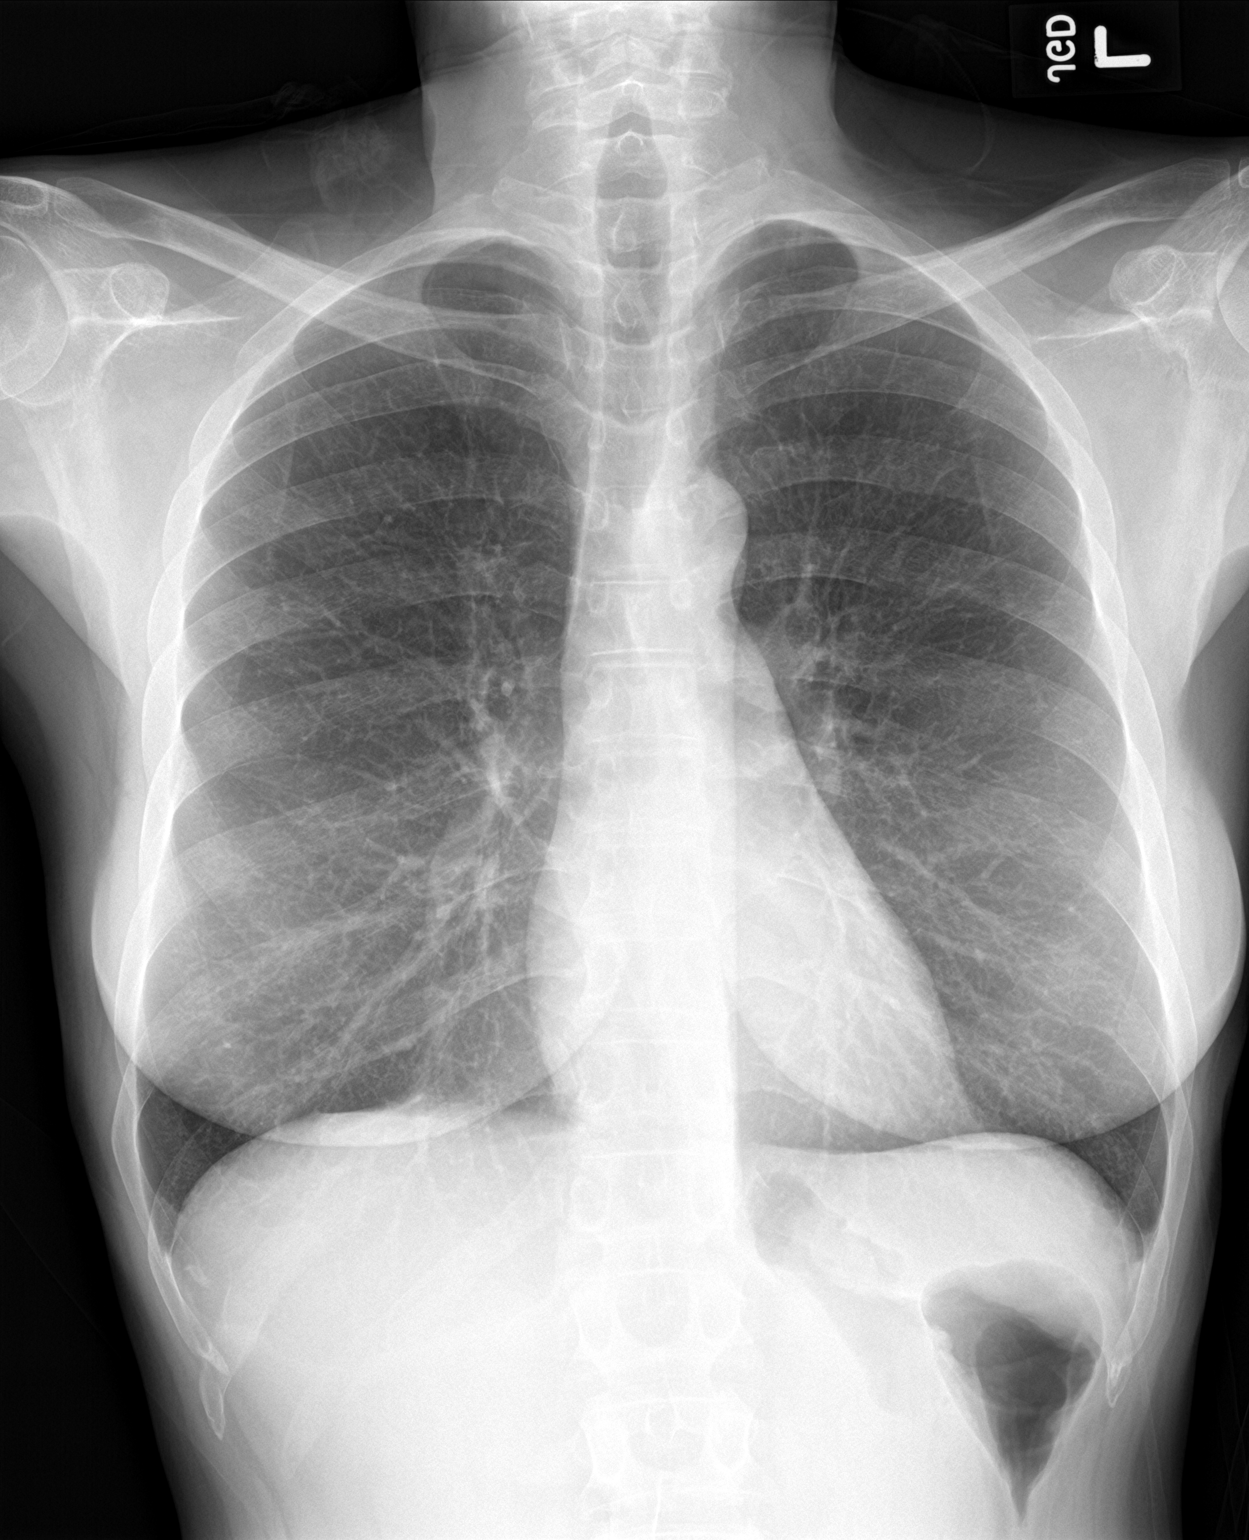

[chest lat]
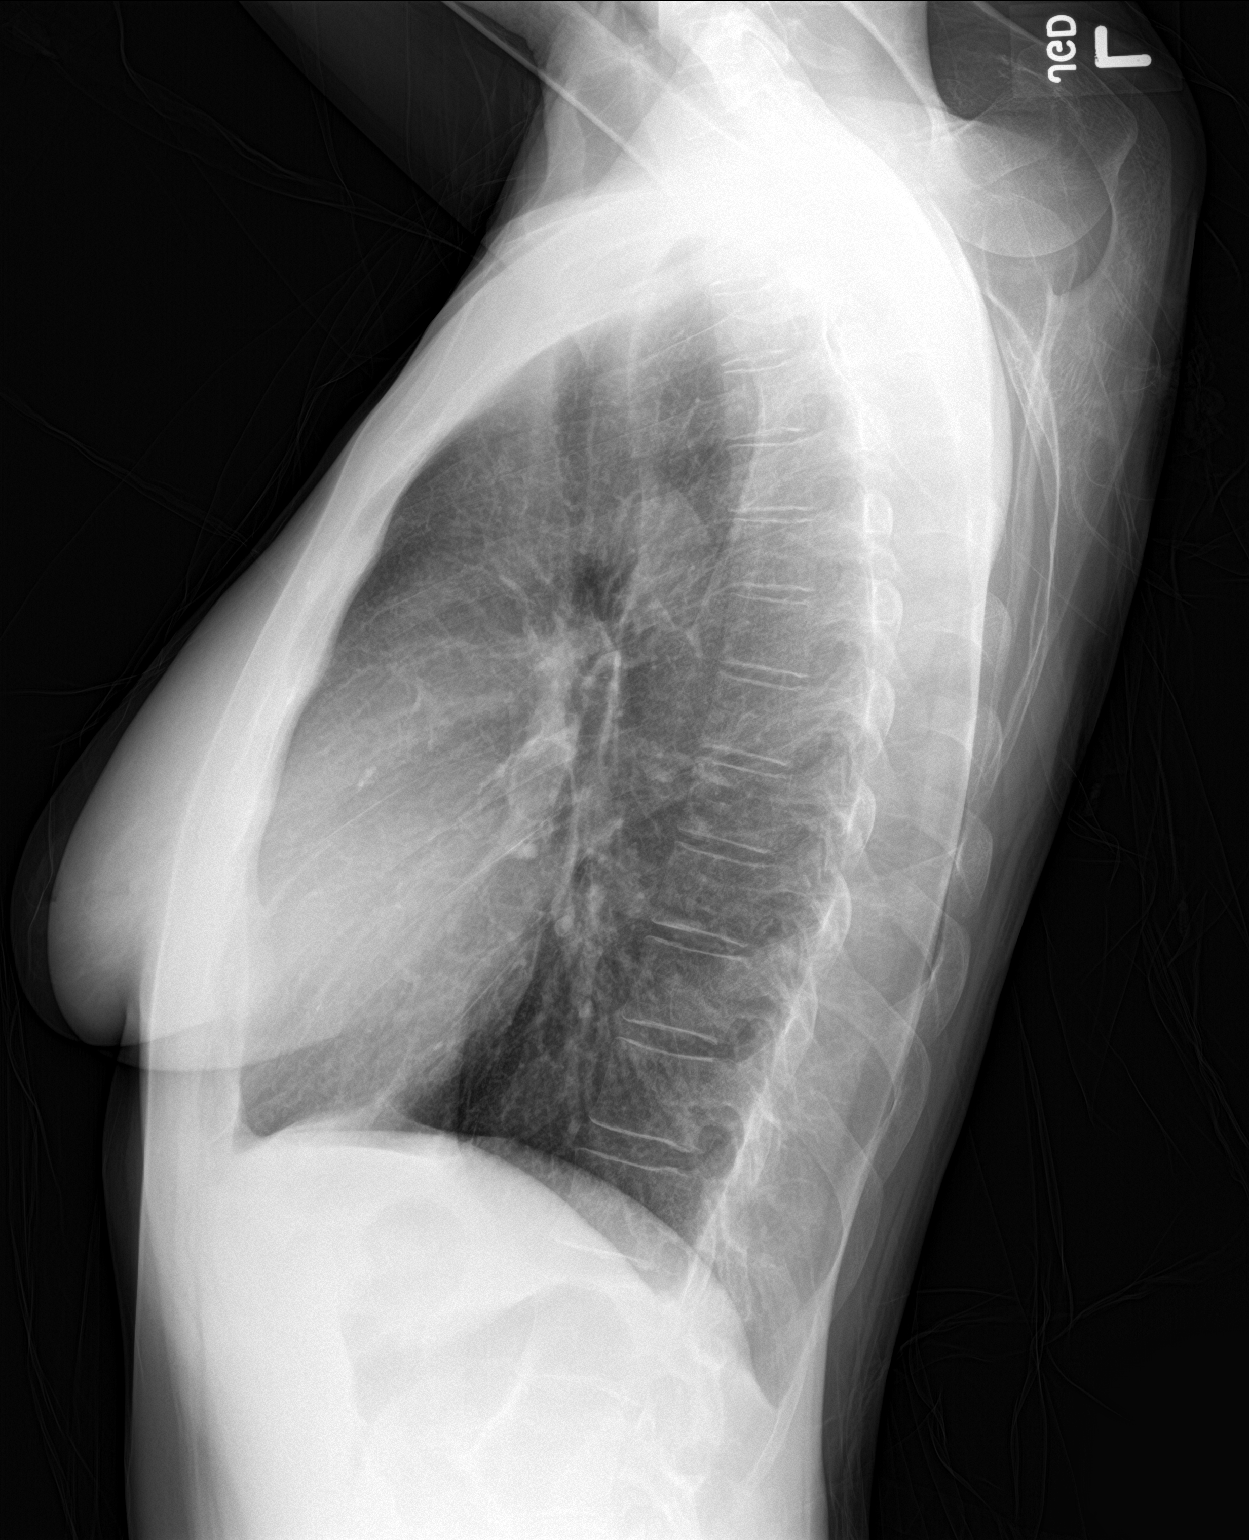

[2 of 2 positions shown; findings below may reference images not displayed]

FINDINGS: Lungs are clear. Heart size and pulmonary vascularity are normal. No
adenopathy. No bone lesions.
IMPRESSION: Lungs clear.  Cardiac silhouette normal.

## 2022-11-20 ENCOUNTER — Encounter: Payer: Self-pay | Admitting: Internal Medicine

## 2022-11-20 ENCOUNTER — Ambulatory Visit (INDEPENDENT_AMBULATORY_CARE_PROVIDER_SITE_OTHER): Payer: Medicaid Other | Admitting: Internal Medicine

## 2022-11-20 VITALS — BP 110/64 | HR 50 | Ht 64.0 in | Wt 111.8 lb

## 2022-11-20 DIAGNOSIS — F1010 Alcohol abuse, uncomplicated: Secondary | ICD-10-CM | POA: Diagnosis not present

## 2022-11-20 DIAGNOSIS — K860 Alcohol-induced chronic pancreatitis: Secondary | ICD-10-CM | POA: Diagnosis not present

## 2022-11-20 DIAGNOSIS — K5909 Other constipation: Secondary | ICD-10-CM

## 2022-11-20 NOTE — Patient Instructions (Addendum)
Continue Protonix and Miralax   Please work on complete alcohol avoidance _______________________________________________________  If your blood pressure at your visit was 140/90 or greater, please contact your primary care physician to follow up on this.  _______________________________________________________  If you are age 48 or older, your body mass index should be between 23-30. Your Body mass index is 19.19 kg/m. If this is out of the aforementioned range listed, please consider follow up with your Primary Care Provider.  If you are age 84 or younger, your body mass index should be between 19-25. Your Body mass index is 19.19 kg/m. If this is out of the aformentioned range listed, please consider follow up with your Primary Care Provider.   ________________________________________________________  The [place] [facility] providers would like to encourage you to use [facility] to communicate with providers for non-urgent requests or questions.  Due to long hold times on the telephone, sending your provider a message by [facility] may be a faster and more efficient way to get a response.  Please allow 48 business hours for a response.  Please remember that this is for non-urgent requests.  _______________________________________________________  It was a pleasure to see you today!  Thank you for trusting me with your gastrointestinal care!

## 2022-11-20 NOTE — Progress Notes (Signed)
Subjective:    Patient ID: Felicia Perkins, female    DOB: 1975/11/21, 47 y.o.   MRN: [id number]  HPI Felicia Perkins is a 47 year old female with a history of chronic alcohol-related pancreatitis, alcohol associated liver disease, chronic constipation, adenomatous colon polyp tobacco use, anxiety and depression, history of cervical cancer who is here for follow-up.  She is here alone today and was last seen at the time of upper and lower endoscopy in June 2023.  EGD was performed to rule out esophageal varices as portal hypertension was suggested by CT scan.  This was discordant with elastography which did not show advanced fibrosis. EGD, no varices.  Mildly erythematous mucosa which was biopsied.  No portal gastropathy.  There was no H. pylori. Colonoscopy 3 mm adenomatous polyp was removed and scattered diverticulosis was seen.  Small internal hemorrhoids  Today she reports that she was readmitted to the hospital in March 2024 for 4 days for acute on chronic alcohol-related pancreatitis.  Also around this time she was using ibuprofen 800 mg in addition to drinking mikes hard lemonade.  She broke her hip in October after a fall and was using high-dose ibuprofen for pain.  She admits to trying to cut back on her alcohol intake.  Her daughter was killed by a stray bullet in October and she has had grief and anxiety worsening since that time.  She is cut back but drinks alcohol at least 2 days a week.  Last night she drank 4 to 6 ounces of moscato wine.  She estimates she drinks about 16 ounces of wine or mikes hard lemonade per week.  She continues to work at [employer].  Her constipation is better with daily MiraLAX and occasional Maalox.   Review of Systems     Objective:   Physical Exam BP 110/64   Pulse (!) 50   Ht 5\' 4"  (1.626 m)   Wt 111 lb 12.8 oz (50.7 kg)   SpO2 99%   BMI 19.19 kg/m  Gen: awake, alert, NAD HEENT: anicteric  CV: RRR, no mrg Pulm: CTA b/l Abd: soft,  NT/ND, +BS throughout Ext: no c/c/e Neuro: nonfocal     Latest Ref Rng & Units 10/31/2022    6:37 AM 10/29/2022    9:56 PM 08/17/2022   10:39 AM  CBC  WBC 4.0 - 10.5 K/uL 7.2  8.1  7.5   Hemoglobin 12.0 - 15.0 g/dL 44.9  67.5  91.6   Hematocrit 36.0 - 46.0 % 37.9  40.7  43.5   Platelets 150 - 400 K/uL 211  297  281    CMP     Component Value Date/Time   NA 136 11/01/2022 0529   NA 138 11/06/2021 1046   K 4.5 11/01/2022 0529   CL 103 11/01/2022 0529   CO2 26 11/01/2022 0529   GLUCOSE 101 (H) 11/01/2022 0529   BUN <5 (L) 11/01/2022 0529   BUN 5 (L) 11/06/2021 1046   CREATININE 0.49 11/01/2022 0529   CREATININE 0.50 10/15/2016 1021   CALCIUM 9.4 11/01/2022 0529   PROT 7.4 10/30/2022 0423   PROT 7.2 11/06/2021 1046   ALBUMIN 4.2 10/30/2022 0423   ALBUMIN 4.7 11/06/2021 1046   AST 23 10/30/2022 0423   ALT 16 10/30/2022 0423   ALKPHOS 122 10/30/2022 0423   BILITOT 0.6 10/30/2022 0423   BILITOT 0.2 11/06/2021 1046   GFRNONAA >60 11/01/2022 0529   GFRNONAA >89 10/15/2016 1021   GFRAA >60 05/13/2020 0531   GFRAA >  89 10/15/2016 1021   Lab Results  Component Value Date   INR 1.1 (H) 12/01/2021   INR 1.0 01/18/2021   INR 1.0 12/05/2020    Fibrosis 4 Score = 1.31 (Indeterminate)       Interpretation for patients with NAFLD          <1.30       -  F0-F1 (Low risk)          1.30-2.67 -  Indeterminate           >2.67      -  F3-F4 (High risk)     Validated for ages 40-65   CT ABDOMEN AND PELVIS WITH CONTRAST   TECHNIQUE: Multidetector CT imaging of the abdomen and pelvis was performed using the standard protocol following bolus administration of intravenous contrast.   RADIATION DOSE REDUCTION: This exam was performed according to the departmental dose-optimization program which includes automated exposure control, adjustment of the mA and/or kV according to patient size and/or use of iterative reconstruction technique.   CONTRAST:  OMNIPAQUE IOHEXOL 300 MG/ML   SOLN   COMPARISON:  CT abdomen pelvis dated 08/17/2022.   FINDINGS: Lower chest: The visualized lung bases are clear.   No intra-abdominal free air or free fluid.   Hepatobiliary: The liver is unremarkable. No biliary ductal dilatation. The gallbladder is unremarkable   Pancreas: There is coarse calcification of the head and uncinate process of the pancreas sequela of chronic pancreatitis. There is mild haziness of the peripancreatic fat which may represent an acute on chronic pancreatitis. Correlation with pancreatic enzymes recommended. No drainable fluid collection/abscess or pseudocyst. There is no dilatation of the main pancreatic duct or gland atrophy.   Spleen: Normal in size without focal abnormality.   Adrenals/Urinary Tract: The adrenal glands unremarkable. There is no hydronephrosis on either side. Subcentimeter left renal interpolar hypodense focus is too small to characterize. The visualized ureters and urinary bladder appear unremarkable.   Stomach/Bowel: There is loose stool throughout the colon consistent with diarrheal state. Correlation with clinical exam and stool cultures recommended. There is no bowel obstruction. The appendix is normal.   Vascular/Lymphatic: Moderate aortoiliac atherosclerotic disease. The IVC is unremarkable. No portal venous gas. There is no adenopathy.   Reproductive: Hysterectomy.  No adnexal masses.   Other: None   Musculoskeletal: Left femoral neck fixation screws. No acute osseous pathology.   IMPRESSION: 1. Diarrheal state. Correlation with clinical exam and stool cultures recommended. No bowel obstruction. Normal appendix. 2. Sequela of chronic pancreatitis. Mild haziness of the peripancreatic fat may represent an acute on chronic pancreatitis. Correlation with pancreatic enzymes recommended. 3.  Aortic Atherosclerosis (ICD10-I70.0).     Electronically Signed   By: Elgie Collard M.D.   On: 10/29/2022 23:57         Assessment & Plan:  47 year old female with a history of chronic alcohol-related pancreatitis, alcohol associated liver disease, chronic constipation, adenomatous colon polyp tobacco use, anxiety and depression, history of cervical cancer who is here for follow-up.  Chronic alcohol related calcific pancreatitis --acute exacerbation of her pancreatitis in mid March due to alcohol.  We discussed this today.  I stressed the importance of complete alcohol abstinence to help avoid acute on chronic pancreatitis and worsening of chronic pancreatitis.  We discussed how chronic pancreatitis can lead to significant pain and morbidity as well as raise the risk of pancreas cancer. --Alcohol cessation --Consider repeat cross-sectional imaging in 1 year  2.  Alcohol abuse/history of alcohol-related liver  disease --she had evidence of portal hypertension on a CT scan from 2023 but I have a feeling this was acute and not chronic.  While fib 4 is not indicated her score is low and her elastography did not show advanced fibrosis.  Recent cross-sectional imaging with contrast also does not show any evidence for cirrhosis or portal hypertension.  While I told her she likely does not have cirrhosis now if she continues to drink this would be a risk going forward --Alcohol cessation  3.  Chronic constipation --relieved with MiraLAX.  Continue MiraLAX daily and Maalox as needed  4.  Small adenomatous colon polyp --7-year recall-June 2030  I would like to see her back in about 3 months to check on her progress with alcohol cessation  30 minutes total spent today including patient facing time, coordination of care, reviewing medical history/procedures/pertinent radiology studies, and documentation of the encounter.

## 2022-11-30 ENCOUNTER — Telehealth: Payer: Self-pay | Admitting: Family Medicine

## 2022-11-30 NOTE — Telephone Encounter (Signed)
Pt is calling to request letter for assistance to [employer]. Needing letter to document that the patient had surgery and what the surgery was for. Pt needs assistance with her light bill since she was out of work. Please advise CB- [phone]

## 2022-12-03 NOTE — Telephone Encounter (Signed)
I returned the call to patient- [phone] and left a message requesting a call back. I also left a reminded of her appointment tomorrow with Dr Alvis Lemmings @ [phone]

## 2022-12-04 ENCOUNTER — Ambulatory Visit: Payer: Medicaid Other | Attending: Family Medicine | Admitting: Family Medicine

## 2022-12-04 VITALS — BP 91/57 | HR 75 | Ht 64.0 in | Wt 112.6 lb

## 2022-12-04 DIAGNOSIS — Z8719 Personal history of other diseases of the digestive system: Secondary | ICD-10-CM | POA: Insufficient documentation

## 2022-12-04 DIAGNOSIS — K7030 Alcoholic cirrhosis of liver without ascites: Secondary | ICD-10-CM | POA: Diagnosis not present

## 2022-12-04 DIAGNOSIS — F1020 Alcohol dependence, uncomplicated: Secondary | ICD-10-CM | POA: Insufficient documentation

## 2022-12-04 DIAGNOSIS — Z634 Disappearance and death of family member: Secondary | ICD-10-CM | POA: Insufficient documentation

## 2022-12-04 DIAGNOSIS — I9589 Other hypotension: Secondary | ICD-10-CM | POA: Diagnosis not present

## 2022-12-04 DIAGNOSIS — J438 Other emphysema: Secondary | ICD-10-CM | POA: Diagnosis present

## 2022-12-04 DIAGNOSIS — Z9889 Other specified postprocedural states: Secondary | ICD-10-CM | POA: Diagnosis not present

## 2022-12-04 DIAGNOSIS — Z7902 Long term (current) use of antithrombotics/antiplatelets: Secondary | ICD-10-CM | POA: Diagnosis not present

## 2022-12-04 DIAGNOSIS — F17210 Nicotine dependence, cigarettes, uncomplicated: Secondary | ICD-10-CM | POA: Insufficient documentation

## 2022-12-04 DIAGNOSIS — Z79899 Other long term (current) drug therapy: Secondary | ICD-10-CM | POA: Insufficient documentation

## 2022-12-04 DIAGNOSIS — Z720 Tobacco use: Secondary | ICD-10-CM

## 2022-12-04 MED ORDER — HYDROXYZINE HCL 25 MG PO TABS: 25.0000 mg | ORAL_TABLET | Freq: Three times a day (TID) | ORAL | 3 refills | Status: DC | PRN

## 2022-12-04 MED ORDER — DULERA 200-5 MCG/ACT IN AERO: 2.0000 | INHALATION_SPRAY | Freq: Every day | RESPIRATORY_TRACT | 3 refills | Status: DC | PRN

## 2022-12-04 MED ORDER — ALBUTEROL SULFATE HFA 108 (90 BASE) MCG/ACT IN AERS: 1.0000 | INHALATION_SPRAY | Freq: Four times a day (QID) | RESPIRATORY_TRACT | 2 refills | Status: DC | PRN

## 2022-12-04 NOTE — Progress Notes (Signed)
Subjective:  Patient ID: Felicia Perkins, female    DOB: 07/13/1976  Age: 47 y.o. MRN: [id number]  CC: Hospitalization Follow-up   Felicia Perkins is a 47 y.o. year old female with a history of alcoholic liver cirrhosis, chronic alcoholic pancreatitis, COPD,  R ICA stenosis (s/p R CEA in 01/2021) stenosis, tobacco abuse, s/p right carotid endarterectomy in 01/2021 due to right ICA stenosis and is currently, closed fracture of the left hip status post percutaneous fixation of left femoral neck.  Interval History: She was hospitalized in 10/2022 for exacerbation of her pancreatitis.  Seen by GI for follow-up 2 weeks ago.  She was also hospitalized for same 3 months ago.  She has a soft BP and has been on Amlodipine.  Denies presence of dizziness or near syncope. Her emphysema has been stable with no flares. She remains on Plavix due to her ICA stenosis and will be following up with vascular soon. Smokes < half a ppd also working on decreasing her alcohol consumption.  Since the loss of her daughter 7 months ago she never enrolled in grief counseling and is not ready to do so. Past Medical History:  Diagnosis Date   Acute on chronic pancreatitis 03/22/2019   Acute pancreatitis    Alcohol abuse    Alcohol dependence 12/16/2012   Medical detox successful    Alcohol induced acute pancreatitis 11/22/2017   Alcoholic cirrhosis of liver without ascites 10/15/2016   Anxiety    Aortic atherosclerosis    Bipolar 1 disorder    Cancer    cervical cancer   Chest pain with moderate risk for cardiac etiology 12/15/2014   Cirrhosis    COPD (chronic obstructive pulmonary disease) 09/04/2016   Coronary artery disease    Depression    Duodenitis 11/22/2017   Eczema 10/09/2018   Elevated liver enzymes 12/22/2014   Fracture of femoral neck, left 06/04/2022   Gastritis and gastroduodenitis 09/04/2016   GERD (gastroesophageal reflux disease)    Hip fracture 06/03/2022   Nondisplaced fracture  of neck of fifth metacarpal bone, right hand, initial encounter for closed fracture 08/26/2017   Pancreatitis    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 Early 20's. Normal   ENDARTERECTOMY Right 01/26/2021   Procedure: RIGHT CAROTID ARTERY ENDARTERECTOMY;  Surgeon: Maeola Harman, MD;  Location: [facility] OR;  Service: Vascular;  Laterality: Right;   ESOPHAGOGASTRODUODENOSCOPY (EGD) WITH PROPOFOL N/A 01/02/2018   Procedure: ESOPHAGOGASTRODUODENOSCOPY (EGD) WITH PROPOFOL;  Surgeon: [name], MD;  Location: [facility];  Service: Endoscopy;  Laterality: N/A;   EUS N/A 01/02/2018   Procedure: UPPER ENDOSCOPIC ULTRASOUND (EUS) RADIAL;  Surgeon: [name], MD;  Location: [facility];  Service: Endoscopy;  Laterality: N/A;   FOOT SURGERY     HIP PINNING,CANNULATED Left 06/05/2022   Procedure: PERCUTANEOUS FIXATION OF FEMORAL NECK;  Surgeon: Durene Romans, MD;  Location: [facility] OR;  Service: Orthopedics;  Laterality: Left;   OTHER SURGICAL HISTORY     ovarian surgery   TUBAL LIGATION      Family History  Problem Relation Age of Onset  Hypertension Mother    Breast cancer Mother 35   Pancreatitis Mother    Hypertension Father    Heart attack Cousin 30       died in 1s of MI   Breast cancer Maternal Aunt    Breast cancer Maternal Aunt    Colon cancer Neg Hx    Esophageal cancer Neg Hx    Rectal cancer Neg Hx    Stomach cancer Neg Hx     Social History   Socioeconomic History   Marital status: Single    Spouse name: Not on file   Number of children: 3   Years of education: Not on file   Highest education  level: 12th grade  Occupational History   Occupation: homemaker  Tobacco Use   Smoking status: Every Day    Packs/day: .25    Types: Cigarettes   Smokeless tobacco: Never   Tobacco comments:    2-3 cigarettes daily  Vaping Use   Vaping Use: Never used  Substance and Sexual Activity   Alcohol use: Yes    Comment: 2 glasses   Drug use: No   Sexual activity: Yes    Birth control/protection: Condom  Other Topics Concern   Not on file  Social History Narrative   Not on file   Social Determinants of Health   Financial Resource Strain: Medium Risk (12/04/2022)   Overall Financial Resource Strain (CARDIA)    Difficulty of Paying Living Expenses: Somewhat hard  Food Insecurity: No Food Insecurity (12/04/2022)   Hunger Vital Sign    Worried About Running Out of Food in the Last Year: Never true    Ran Out of Food in the Last Year: Never true  Transportation Needs: No Transportation Needs (12/04/2022)   PRAPARE - Administrator, Civil Service (Medical): No    Lack of Transportation (Non-Medical): No  Physical Activity: Insufficiently Active (12/04/2022)   Exercise Vital Sign    Days of Exercise per Week: 2 days    Minutes of Exercise per Session: 30 min  Stress: Stress Concern Present (12/04/2022)   [employer] of Occupational Health - Occupational Stress Questionnaire    Feeling of Stress : To some extent  Social Connections: Moderately Isolated (12/04/2022)   Social Connection and Isolation Panel [NHANES]    Frequency of Communication with Friends and Family: More than three times a week    Frequency of Social Gatherings with Friends and Family: Once a week    Attends Religious Services: 1 to 4 times per year    Active Member of [employer] or Organizations: No    Attends Engineer, structural: Not on file    Marital Status: Never married    No Known Allergies  Outpatient Medications Prior to Visit  Medication Sig Dispense Refill   alum & mag  hydroxide-simeth (MAALOX PLUS) 400-400-40 MG/5ML suspension Take 15 mLs by mouth every 6 (six) hours as needed for indigestion.     clopidogrel (PLAVIX) 75 MG tablet TAKE 1 TABLET BY MOUTH EVERY DAY (Patient taking differently: Take 75 mg by mouth daily.) 30 tablet 11   famotidine (PEPCID) 20 MG tablet Take 1 tablet (20 mg total) by mouth 2 (two) times daily. 60 tablet 2   mirtazapine (REMERON SOL-TAB) 15 MG disintegrating tablet TAKE 1 TABLET BY MOUTH EVERYDAY AT BEDTIME 30 tablet 0   ondansetron (ZOFRAN) 4 MG tablet Take 1 tablet (4 mg total) by mouth every 8 (eight) hours as needed for nausea or vomiting.  30 tablet 0   OVER THE COUNTER MEDICATION Take 1 capsule by mouth daily. [place]     pantoprazole (PROTONIX) 40 MG tablet Take 1 tablet (40 mg total) by mouth daily. 30 tablet 6   polyethylene glycol (MIRALAX / GLYCOLAX) 17 g packet Take 17 g by mouth daily. (Patient taking differently: Take 17 g by mouth daily as needed for moderate constipation.) 14 each 0   rosuvastatin (CRESTOR) 10 MG tablet TAKE 1 TABLET BY MOUTH EVERY DAY (Patient taking differently: Take 10 mg by mouth daily.) 30 tablet 11   albuterol (VENTOLIN HFA) 108 (90 Base) MCG/ACT inhaler Inhale 1-2 puffs into the lungs every 6 (six) hours as needed for wheezing or shortness of breath. 8.5 g 2   amLODipine (NORVASC) 2.5 MG tablet TAKE 1 TABLET BY MOUTH EVERY DAY 30 tablet 1   hydrOXYzine (ATARAX) 25 MG tablet Take 1 tablet (25 mg total) by mouth 3 (three) times daily as needed. (Patient taking differently: Take 25 mg by mouth 3 (three) times daily as needed for anxiety.) 60 tablet 3   mometasone-formoterol (DULERA) 200-5 MCG/ACT AERO Inhale 2 puffs into the lungs daily as needed. (Patient taking differently: Inhale 2 puffs into the lungs daily as needed for wheezing or shortness of breath.) 13 g 3   cetirizine (ZYRTEC) 10 MG tablet Take 1 tablet (10 mg total) by mouth daily. (Patient taking differently: Take 10 mg by mouth daily as  needed.) 30 tablet 1   docusate sodium (COLACE) 100 MG capsule Take 1 capsule (100 mg total) by mouth 2 (two) times daily. (Patient taking differently: Take 100 mg by mouth as needed for mild constipation.) 10 capsule 0   oxyCODONE (OXY IR/ROXICODONE) 5 MG immediate release tablet Take 1 tablet (5 mg total) by mouth every 6 (six) hours as needed for moderate pain or severe pain. 20 tablet 0   No facility-administered medications prior to visit.     ROS Review of Systems  Constitutional:  Negative for activity change and appetite change.  HENT:  Negative for sinus pressure and sore throat.   Respiratory:  Negative for chest tightness, shortness of breath and wheezing.   Cardiovascular:  Negative for chest pain and palpitations.  Gastrointestinal:  Negative for abdominal distention, abdominal pain and constipation.  Genitourinary: Negative.   Musculoskeletal: Negative.   Psychiatric/Behavioral:  Negative for behavioral problems and dysphoric mood.     Objective:  BP (!) 91/57   Pulse 75   Ht 5\' 4"  (1.626 m)   Wt 112 lb 9.6 oz (51.1 kg)   SpO2 100%   BMI 19.33 kg/m      12/04/2022    2:48 PM 11/20/2022    3:34 PM 11/01/2022    5:13 AM  BP/Weight  Systolic BP 91 110 107  Diastolic BP 57 64 75  Wt. (Lbs) 112.6 111.8   BMI 19.33 kg/m2 19.19 kg/m2       Physical Exam Constitutional:      Appearance: She is well-developed.  Cardiovascular:     Rate and Rhythm: Normal rate.     Heart sounds: Normal heart sounds. No murmur heard. Pulmonary:     Effort: Pulmonary effort is normal.     Breath sounds: Normal breath sounds. No wheezing or rales.  Chest:     Chest wall: No tenderness.  Abdominal:     General: Bowel sounds are normal. There is no distension.     Palpations: Abdomen is soft. There is no mass.  Tenderness: There is no abdominal tenderness.  Musculoskeletal:        General: Normal range of motion.     Right lower leg: No edema.     Left lower leg: No edema.   Neurological:     Mental Status: She is alert and oriented to person, place, and time.  Psychiatric:        Mood and Affect: Mood normal.        Latest Ref Rng & Units 11/01/2022    5:29 AM 10/31/2022    6:37 AM 10/30/2022    4:23 AM  CMP  Glucose 70 - 99 mg/dL 161  96  096   BUN 6 - 20 mg/dL <5  7  <5   Creatinine 0.44 - 1.00 mg/dL 0.45  4.09  8.11   Sodium 135 - 145 mmol/L 136  136  136   Potassium 3.5 - 5.1 mmol/L 4.5  4.3  3.9   Chloride 98 - 111 mmol/L 103  100  101   CO2 22 - 32 mmol/L Calcium 8.9 - 10.3 mg/dL 9.4  9.4  9.5   Total Protein 6.5 - 8.1 g/dL   7.4   Total Bilirubin 0.3 - 1.2 mg/dL   0.6   Alkaline Phos 38 - 126 U/L   122   AST 15 - 41 U/L   23   ALT 0 - 44 U/L   16     Lipid Panel     Component Value Date/Time   CHOL 215 (H) 01/24/2022 1054   TRIG 232 (H) 01/24/2022 1054   HDL 79 11/06/2021 1046   CHOLHDL 3.7 03/22/2019 1428   VLDL 18 03/22/2019 1428   LDLCALC 83 11/06/2021 1046    CBC    Component Value Date/Time   WBC 7.2 10/31/2022 0637   RBC 3.89 10/31/2022 0637   HGB 13.0 10/31/2022 0637   HGB 12.6 11/06/2021 1046   HCT 37.9 10/31/2022 0637   HCT 38.7 11/06/2021 1046   PLT 211 10/31/2022 0637   PLT 244 11/06/2021 1046   MCV 97.4 10/31/2022 0637   MCV 101 (H) 11/06/2021 1046   MCH 33.4 10/31/2022 0637   MCHC 34.3 10/31/2022 0637   RDW 13.2 10/31/2022 0637   RDW 13.1 11/06/2021 1046   LYMPHSABS 1.1 06/05/2022 0347   LYMPHSABS 3.2 (H) 11/06/2021 1046   MONOABS 0.6 06/05/2022 0347   EOSABS 0.0 06/05/2022 0347   EOSABS 0.1 11/06/2021 1046   BASOSABS 0.0 06/05/2022 0347   BASOSABS 0.1 11/06/2021 1046    Lab Results  Component Value Date   HGBA1C 4.6 08/25/2019    Assessment & Plan:  1. Other emphysema Controlled with no flares - albuterol (VENTOLIN HFA) 108 (90 Base) MCG/ACT inhaler; Inhale 1-2 puffs into the lungs every 6 (six) hours as needed for wheezing or shortness of breath.  Dispense: 8.5 g; Refill: 2 -  mometasone-formoterol (DULERA) 200-5 MCG/ACT AERO; Inhale 2 puffs into the lungs daily as needed.  Dispense: 13 g; Refill: 3  2. Bereavement She lost her daughter 7 months ago States she is doing better but still declines grief counseling - hydrOXYzine (ATARAX) 25 MG tablet; Take 1 tablet (25 mg total) by mouth 3 (three) times daily as needed.  Dispense: 60 tablet; Refill: 3  3. Alcoholic cirrhosis of liver without ascites Ongoing alcohol dependence The loss of her daughter 7 months ago exacerbated her symptoms with subsequent hospitalization twice in the last 4 months  for chronic pancreatitis She is asymptomatic at the moment Declines referral for substance abuse counseling  4. S/P carotid endarterectomy Remains on Plavix Secondary risk factor modification Follow-up with vascular  5. Other specified hypotension Discontinue amlodipine She is asymptomatic  6. Tobacco abuse Spent 3 minutes counseling on smoking cessation and she is working on quitting Declines pharmacotherapy and would like to work on this on her own.   Meds ordered this encounter  Medications   albuterol (VENTOLIN HFA) 108 (90 Base) MCG/ACT inhaler    Sig: Inhale 1-2 puffs into the lungs every 6 (six) hours as needed for wheezing or shortness of breath.    Dispense:  8.5 g    Refill:  2   hydrOXYzine (ATARAX) 25 MG tablet    Sig: Take 1 tablet (25 mg total) by mouth 3 (three) times daily as needed.    Dispense:  60 tablet    Refill:  3   mometasone-formoterol (DULERA) 200-5 MCG/ACT AERO    Sig: Inhale 2 puffs into the lungs daily as needed.    Dispense:  13 g    Refill:  3    DX Code Needed - J43.8    Follow-up: Return in about 6 months (around 06/05/2023) for Chronic medical conditions.       Hoy Register, MD, FAAFP. [facility] [place], [place] [phone]   12/04/2022, 3:13 PM

## 2022-12-04 NOTE — Patient Instructions (Signed)
Alcohol Misuse and Dependence Information, Adult Alcohol is a widely available drug and people choose to drink alcohol in different amounts. Alcohol misuse and dependence can have a negative effect on your life. Alcohol misuse is when you use alcohol too much or too often. You may have a hard time setting a limit on the amount you drink. Alcohol dependence is when you use alcohol consistently for a period of time, and your body changes as a result. Alcohol dependence can make it hard for you to stop drinking because you may start to feel sick or different when you do not drink alcohol. These symptoms are known as withdrawal. People who drink alcohol very often and in large amounts, may develop what is called an alcohol use disorder. How can alcohol misuse and dependence affect me? Drinking too much can lead to addiction. You may feel like you need alcohol to function normally. You may drink alcohol before work in the morning, during the day, or as soon as you get home from work in the evening. These actions can result in: Poor work performance. Job loss. Financial problems. Car crashes or criminal charges from driving after drinking alcohol. Problems in your relationships with friends and family. Losing the trust and respect of coworkers, friends, and family. Drinking heavily over a long period of time can permanently damage your body and brain, and can cause lifelong health issues, such as: Damage to your liver or pancreas. Heart problems, high blood pressure, or stroke. Certain cancers. Decreased ability to fight infections. Brain or nerve damage. Depression. Early death, also called premature death. If you are careless or you crave alcohol, it is easy to drink more than your body can handle (overdose). Alcohol overdose is a serious situation that requires hospitalization. It may lead to permanent injuries or death. What can increase my risk? Having a family history of alcohol misuse. Having  depression or other mental health conditions. Beginning to drink at an early age. Binge drinking often. Experiencing trauma, stress, and an unstable home life during childhood. Spending time with people who drink often. What actions can I take to prevent alcohol misuse and dependence? Do not drink alcohol if: Your health care provider tells you not to drink. You are pregnant, may be pregnant, or are planning to become pregnant. If you drink alcohol: Limit how much you have to: 0-1 drink a day for women who are not pregnant. 0-2 drinks a day for men. Know how much alcohol is in your drink. In the [place] one drink equals one 12 oz bottle of beer (355 mL), one 5 oz glass of wine (148 mL), or one 1 oz glass of hard liquor (44 mL). If you think you have an alcohol dependency problem, decide to stop drinking. This can be very hard to do if you are used to frequently drinking alcohol. If you begin to have withdrawal symptoms, talk with your health care provider or a person that you trust. These symptoms may include anxiety, shaky hands, headache, nausea, sweating, or not being able to sleep. Choose to drink nonalcoholic beverages in social gatherings and places where there may be alcohol. Activity Spend more time on activities that you enjoy that do not involve alcohol, like hobbies or exercise. Find healthy ways to cope with stress, such as meditation or spending time with people you care about. General information Talk to your family, coworkers, and friends about supporting you in your efforts to stop drinking. If they drink, ask them not to drink   around you. Spend more time with people who do not drink alcohol. If you think that you have an alcohol dependency problem: Tell friends or family about your concerns. Talk with your health care provider or another health professional about where to get help. Work with a therapist and a chemical dependency counselor. Consider joining a support group  for people who struggle with alcohol misuse and dependence. Where to find support  Your health care provider. SMART Recovery: smartrecovery.org Local treatment centers or chemical dependency counselors. Local AA groups in your community: aa.org Where to find more information Centers for Disease Control and Prevention: cdc.gov National Institute on Alcohol Abuse and Alcoholism: niaaa.nih.gov Alcoholics Anonymous (AA): aa.org Contact a health care provider if: You drank more or for longer than you intended on more than one occasion. You often drink to the point of vomiting or passing out. You have problems in your life due to drinking, but you continue to drink. You keep drinking even though you feel anxious, depressed, or have experienced memory loss. You have stopped doing the things you used to enjoy in order to drink. You have to drink more than you used to in order to get the effect you want. You experience anxiety, sweating, nausea, shakiness, and trouble sleeping when you try to stop drinking. Get help right away if: You have serious withdrawal symptoms, including: Confusion. Racing heart. High blood pressure. Fever. These symptoms may be an emergency. Get help right away. Call 911. Do not wait to see if the symptoms will go away. Do not drive yourself to the hospital. Also, get help right away if: You have thoughts about hurting yourself or others. Take one of these steps if you feel like you may hurt yourself or others, or have thoughts about taking your own life: Call 911. Call the National Suicide Prevention Lifeline at 1-800-273-8255 or 988. This is open 24 hours a day. Text the Crisis Text Line at 741741. Summary Alcohol misuse and dependence can have a negative effect on your life. Drinking too much or too often can lead to addiction. If you drink alcohol, limit how much you use. If you are having trouble keeping your drinking under control, find ways to change your  behavior. Hobbies, calming activities, exercise, or support groups can help. If you feel you need help with changing your drinking habits, talk with your health care provider, a good friend, or a therapist, or go to a support group. This information is not intended to replace advice given to you by your health care provider. Make sure you discuss any questions you have with your health care provider. Document Revised: 10/04/2021 Document Reviewed: 10/04/2021 Elsevier Patient Education  2023 Elsevier Inc.  

## 2022-12-05 NOTE — Telephone Encounter (Signed)
Patient completed her appointment with Dr Newlin Felicia Lemmingsrday.  I spoke to Dr Alvis Perkins this morning and she stated that the patient did not request a letter.

## 2022-12-26 ENCOUNTER — Ambulatory Visit: Payer: Medicaid Other | Admitting: Family Medicine

## 2022-12-27 ENCOUNTER — Other Ambulatory Visit: Payer: Self-pay | Admitting: *Deleted

## 2022-12-27 DIAGNOSIS — Z9889 Other specified postprocedural states: Secondary | ICD-10-CM

## 2022-12-27 DIAGNOSIS — I6523 Occlusion and stenosis of bilateral carotid arteries: Secondary | ICD-10-CM

## 2022-12-30 ENCOUNTER — Other Ambulatory Visit: Payer: Self-pay | Admitting: Vascular Surgery

## 2023-01-08 NOTE — Progress Notes (Unsigned)
Office Note     CC:  follow up Requesting Provider:  Hoy Register, MD  HPI: Felicia Perkins is a 47 y.o. ([birth year]) female who presents for routine follow up of carotid artery stenosis. She is s/p Right CEA on 01/26/21 for asymptomatic high grade stenosis. She has no history of TIA or stroke. She has done well since her surgery with no new neurological symptoms. She has been managed on Plavix and statin. At her last visit she has restarted smoking, which she was encouraged to work on smoking cessation  Today she denies any amaurosis fugax or other visual changes, slurred speech, facial drooping, unilateral upper or lower extremity weakness or numbness. She does not have any pain on ambulation or rest, no tissue loss. She is still taking her Statin and Plavix. She is currently still smoking ***   Past Medical History:  Diagnosis Date   Acute on chronic pancreatitis (HCC) 03/22/2019   Acute pancreatitis    Alcohol abuse    Alcohol dependence (HCC) 12/16/2012   Medical detox successful    Alcohol induced acute pancreatitis 11/22/2017   Alcoholic cirrhosis of liver without ascites (HCC) 10/15/2016   Anxiety    Aortic atherosclerosis (HCC)    Bipolar 1 disorder (HCC)    Cancer (HCC)    cervical cancer   Chest pain with moderate risk for cardiac etiology 12/15/2014   Cirrhosis (HCC)    COPD (chronic obstructive pulmonary disease) (HCC) 09/04/2016   Coronary artery disease    Depression    Duodenitis 11/22/2017   Eczema 10/09/2018   Elevated liver enzymes 12/22/2014   Fracture of femoral neck, left (HCC) 06/04/2022   Gastritis and gastroduodenitis 09/04/2016   GERD (gastroesophageal reflux disease)    Hip fracture (HCC) 06/03/2022   Nondisplaced fracture of neck of fifth metacarpal bone, right hand, initial encounter for closed fracture 08/26/2017   Pancreatitis    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   PTSD (post-traumatic stress disorder)    Tobacco abuse 09/04/2016   Tobacco  abuse counseling    Vascular disease     Past Surgical History:  Procedure Laterality Date   ABDOMINAL HYSTERECTOMY  2004   BIOPSY  01/02/2018   Procedure: BIOPSY;  Surgeon: [name], MD;  Location: [facility];  Service: Endoscopy;;   COLONOSCOPY     Done in [place] she thinks. Early 20's. Normal   ENDARTERECTOMY Right 01/26/2021   Procedure: RIGHT CAROTID ARTERY ENDARTERECTOMY;  Surgeon: Maeola Harman, MD;  Location: [facility] OR;  Service: Vascular;  Laterality: Right;   ESOPHAGOGASTRODUODENOSCOPY (EGD) WITH PROPOFOL N/A 01/02/2018   Procedure: ESOPHAGOGASTRODUODENOSCOPY (EGD) WITH PROPOFOL;  Surgeon: [name], MD;  Location: [facility];  Service: Endoscopy;  Laterality: N/A;   EUS N/A 01/02/2018   Procedure: UPPER ENDOSCOPIC ULTRASOUND (EUS) RADIAL;  Surgeon: [name], MD;  Location: [facility];  Service: Endoscopy;  Laterality: N/A;   FOOT SURGERY     HIP PINNING,CANNULATED Left 06/05/2022   Procedure: PERCUTANEOUS FIXATION OF FEMORAL NECK;  Surgeon: Durene Romans, MD;  Location[facility];  Service: Orthopedics;  Laterality: Left;   OTHER SURGICAL HISTORY     ovarian surgery   TUBAL LIGATION      Social History   Socioeconomic History   Marital status: Single    Spouse name: Not on file   Number of children: 3   Years of education: Not on file   Highest education level: 12th grade  Occupational History   Occupation: homemaker  Tobacco Use  Smoking status: Every Day    Packs/day: .25    Types: Cigarettes   Smokeless tobacco: Never   Tobacco comments:    2-3 cigarettes daily  Vaping Use   Vaping Use: Never used  Substance and Sexual Activity   Alcohol use: Yes    Comment: 2 glasses   Drug use: No   Sexual activity: Yes    Birth control/protection: Condom  Other Topics Concern   Not on file  Social History Narrative   Not on file   Social Determinants of Health   Financial Resource Strain: Medium Risk (12/04/2022)   Overall  Financial Resource Strain (CARDIA)    Difficulty of Paying Living Expenses: Somewhat hard  Food Insecurity: No Food Insecurity (12/04/2022)   Hunger Vital Sign    Worried About Running Out of Food in the Last Year: Never true    Ran Out of Food in the Last Year: Never true  Transportation Needs: No Transportation Needs (12/04/2022)   PRAPARE - Administrator, Civil Service (Medical): No    Lack of Transportation (Non-Medical): No  Physical Activity: Insufficiently Active (12/04/2022)   Exercise Vital Sign    Days of Exercise per Week: 2 days    Minutes of Exercise per Session: 30 min  Stress: Stress Concern Present (12/04/2022)   [employer] of Occupational Health - Occupational Stress Questionnaire    Feeling of Stress : To some extent  Social Connections: Moderately Isolated (12/04/2022)   Social Connection and Isolation Panel [NHANES]    Frequency of Communication with Friends and Family: More than three times a week    Frequency of Social Gatherings with Friends and Family: Once a week    Attends Religious Services: 1 to 4 times per year    Active Member of [employer] or Organizations: No    Attends Engineer, structural: Not on file    Marital Status: Never married  Intimate Partner Violence: Not At Risk (10/30/2022)   Humiliation, Afraid, Rape, and Kick questionnaire    Fear of Current or Ex-Partner: No    Emotionally Abused: No    Physically Abused: No    Sexually Abused: No   *** Family History  Problem Relation Age of Onset   Hypertension Mother    Breast cancer Mother 28   Pancreatitis Mother    Hypertension Father    Heart attack Cousin 30       died in 38s of MI   Breast cancer Maternal Aunt    Breast cancer Maternal Aunt    Colon cancer Neg Hx    Esophageal cancer Neg Hx    Rectal cancer Neg Hx    Stomach cancer Neg Hx     Current Outpatient Medications  Medication Sig Dispense Refill   albuterol (VENTOLIN HFA) 108 (90 Base) MCG/ACT  inhaler Inhale 1-2 puffs into the lungs every 6 (six) hours as needed for wheezing or shortness of breath. 8.5 g 2   alum & mag hydroxide-simeth (MAALOX PLUS) 400-400-40 MG/5ML suspension Take 15 mLs by mouth every 6 (six) hours as needed for indigestion.     cetirizine (ZYRTEC) 10 MG tablet Take 1 tablet (10 mg total) by mouth daily. (Patient taking differently: Take 10 mg by mouth daily as needed.) 30 tablet 1   clopidogrel (PLAVIX) 75 MG tablet TAKE 1 TABLET BY MOUTH EVERY DAY 30 tablet 11   docusate sodium (COLACE) 100 MG capsule Take 1 capsule (100 mg total) by mouth 2 (two) times daily. (  Patient taking differently: Take 100 mg by mouth as needed for mild constipation.) 10 capsule 0   famotidine (PEPCID) 20 MG tablet Take 1 tablet (20 mg total) by mouth 2 (two) times daily. 60 tablet 2   hydrOXYzine (ATARAX) 25 MG tablet Take 1 tablet (25 mg total) by mouth 3 (three) times daily as needed. 60 tablet 3   mirtazapine (REMERON SOL-TAB) 15 MG disintegrating tablet TAKE 1 TABLET BY MOUTH EVERYDAY AT BEDTIME 30 tablet 0   mometasone-formoterol (DULERA) 200-5 MCG/ACT AERO Inhale 2 puffs into the lungs daily as needed. 13 g 3   ondansetron (ZOFRAN) 4 MG tablet Take 1 tablet (4 mg total) by mouth every 8 (eight) hours as needed for nausea or vomiting. 30 tablet 0   OVER THE COUNTER MEDICATION Take 1 capsule by mouth daily. Sea [place]     oxyCODONE (OXY IR/ROXICODONE) 5 MG immediate release tablet Take 1 tablet (5 mg total) by mouth every 6 (six) hours as needed for moderate pain or severe pain. 20 tablet 0   pantoprazole (PROTONIX) 40 MG tablet Take 1 tablet (40 mg total) by mouth daily. 30 tablet 6   polyethylene glycol (MIRALAX / GLYCOLAX) 17 g packet Take 17 g by mouth daily. (Patient taking differently: Take 17 g by mouth daily as needed for moderate constipation.) 14 each 0   rosuvastatin (CRESTOR) 10 MG tablet TAKE 1 TABLET BY MOUTH EVERY DAY 30 tablet 0   No current facility-administered medications  for this visit.    No Known Allergies   REVIEW OF SYSTEMS:  *** [X]  denotes positive finding, [ ]  denotes negative finding Cardiac  Comments:  Chest pain or chest pressure:    Shortness of breath upon exertion:    Short of breath when lying flat:    Irregular heart rhythm:        Vascular    Pain in calf, thigh, or hip brought on by ambulation:    Pain in feet at night that wakes you up from your sleep:     Blood clot in your veins:    Leg swelling:         Pulmonary    Oxygen at home:    Productive cough:     Wheezing:         Neurologic    Sudden weakness in arms or legs:     Sudden numbness in arms or legs:     Sudden onset of difficulty speaking or slurred speech:    Temporary loss of vision in one eye:     Problems with dizziness:         Gastrointestinal    Blood in stool:     Vomited blood:         Genitourinary    Burning when urinating:     Blood in urine:        Psychiatric    Major depression:         Hematologic    Bleeding problems:    Problems with blood clotting too easily:        Skin    Rashes or ulcers:        Constitutional    Fever or chills:      PHYSICAL EXAMINATION:  There were no vitals filed for this visit.  General:  WDWN in NAD; vital signs documented above Gait: Not observed HENT: WNL, normocephalic Pulmonary: normal non-labored breathing , without Rales, rhonchi,  wheezing Cardiac: {Desc; regular/irreg:14544} HR Abdomen: soft, NT, no  masses Skin: {With/Without:20273} rashes Vascular Exam/Pulses: *** Extremities: {With/Without:20273} ischemic changes, {With/Without:20273} Gangrene , {With/Without:20273} cellulitis; {With/Without:20273} open wounds;  Musculoskeletal: no muscle wasting or atrophy  Neurologic: A&O X 3*** Psychiatric:  The pt has {Desc; normal/abnormal:11317::"Normal"} affect.   Non-Invasive Vascular Imaging:   ***    ASSESSMENT/PLAN:: 47 y.o. female here for follow up for ***   -***   Graceann Congress, PA-C [facility] [phone]  Clinic MD:   ***

## 2023-01-09 ENCOUNTER — Ambulatory Visit (INDEPENDENT_AMBULATORY_CARE_PROVIDER_SITE_OTHER): Payer: Medicaid Other | Admitting: Physician Assistant

## 2023-01-09 ENCOUNTER — Ambulatory Visit (HOSPITAL_COMMUNITY)
Admission: RE | Admit: 2023-01-09 | Discharge: 2023-01-09 | Disposition: A | Discharge status: Home / Self Care | Payer: Medicaid Other | Source: Ambulatory Visit | Attending: Vascular Surgery | Admitting: Vascular Surgery

## 2023-01-09 VITALS — BP 98/68 | HR 83 | Temp 97.6°F | Wt 109.0 lb

## 2023-01-09 DIAGNOSIS — Z9889 Other specified postprocedural states: Secondary | ICD-10-CM

## 2023-01-09 DIAGNOSIS — I6523 Occlusion and stenosis of bilateral carotid arteries: Secondary | ICD-10-CM

## 2023-01-11 ENCOUNTER — Encounter: Payer: Self-pay | Admitting: Family Medicine

## 2023-01-14 ENCOUNTER — Other Ambulatory Visit: Payer: Self-pay | Admitting: Family Medicine

## 2023-01-14 MED ORDER — TIZANIDINE HCL 4 MG PO TABS: 4.0000 mg | ORAL_TABLET | Freq: Three times a day (TID) | ORAL | 1 refills | Status: DC | PRN

## 2023-01-15 ENCOUNTER — Other Ambulatory Visit: Payer: Self-pay

## 2023-01-15 DIAGNOSIS — I6523 Occlusion and stenosis of bilateral carotid arteries: Secondary | ICD-10-CM

## 2023-01-15 DIAGNOSIS — Z9889 Other specified postprocedural states: Secondary | ICD-10-CM

## 2023-01-31 ENCOUNTER — Encounter (HOSPITAL_COMMUNITY): Payer: Self-pay

## 2023-01-31 ENCOUNTER — Ambulatory Visit (INDEPENDENT_AMBULATORY_CARE_PROVIDER_SITE_OTHER): Payer: 59

## 2023-01-31 ENCOUNTER — Ambulatory Visit (HOSPITAL_COMMUNITY)
Admission: EM | Admit: 2023-01-31 | Discharge: 2023-01-31 | Disposition: A | Discharge status: Home / Self Care | Payer: 59 | Attending: Emergency Medicine | Admitting: Emergency Medicine

## 2023-01-31 DIAGNOSIS — S72002S Fracture of unspecified part of neck of left femur, sequela: Secondary | ICD-10-CM

## 2023-01-31 DIAGNOSIS — M25552 Pain in left hip: Secondary | ICD-10-CM

## 2023-01-31 MED ORDER — TRAMADOL HCL 50 MG PO TABS: 50.0000 mg | ORAL_TABLET | Freq: Four times a day (QID) | ORAL | 0 refills | Status: AC | PRN

## 2023-01-31 NOTE — ED Triage Notes (Signed)
Pt states left hip pain for the past 2 weeks.  States she broke her left hip in Oct 2023 and had pins put in. Can't get an appointment with her orthopedic until July 10th.  Pt ambulates with a limp.  Has been taking tylenol for the pain with no relief.

## 2023-01-31 NOTE — Discharge Instructions (Addendum)
Please go to the [facility] clinic first thing tomorrow morning. I would like you to have further imaging to evaluate your pain.  I have sent a couple tablets of tramadol to your pharmacy.  This is a narcotic medication.  It has addictive properties and can make you drowsy.  DO NOT drink, drive, make judgment decisions, or take other sedating medications while using the narcotic.  Please only use for breakthrough pain or once at bedtime if needed.   Xray report: Lucency along the lateral threaded portion of the upper most screw in the left hip, with mild overlying femoral head cortical irregularity and potentially slight cortical flattening along the lateral portion of the left femoral head. This could be from loosening, infection, or localized bony subsidence.

## 2023-01-31 NOTE — ED Provider Notes (Signed)
[facility]    CSN: [id number] Arrival date & time: 01/31/23  1248     History   Chief Complaint Chief Complaint  Patient presents with   Hip Pain    HPI Felicia Perkins is a 47 y.o. female.  Here with 2-week history of left hip pain. Gradually worsening. Rates 9/10 pain today.  Has been limping; pain worse with weight bearing.  Denies weakness, numbness or tingling.  Fractured left femoral neck in October 2023.  Had surgical pins placed. Denies fever or chills.  Trying Tylenol without relief Appointment with ortho not until July 10. Sees [facility]  She stands a lot at work  Past Medical History:  Diagnosis Date   Acute on chronic pancreatitis (HCC) 03/22/2019   Acute pancreatitis    Alcohol abuse    Alcohol dependence (HCC) 12/16/2012   Medical detox successful    Alcohol induced acute pancreatitis 11/22/2017   Alcoholic cirrhosis of liver without ascites (HCC) 10/15/2016   Anxiety    Aortic atherosclerosis (HCC)    Bipolar 1 disorder (HCC)    Cancer (HCC)    cervical cancer   Chest pain with moderate risk for cardiac etiology 12/15/2014   Cirrhosis (HCC)    COPD (chronic obstructive pulmonary disease) (HCC) 09/04/2016   Coronary artery disease    Depression    Duodenitis 11/22/2017   Eczema 10/09/2018   Elevated liver enzymes 12/22/2014   Fracture of femoral neck, left (HCC) 06/04/2022   Gastritis and gastroduodenitis 09/04/2016   GERD (gastroesophageal reflux disease)    Hip fracture (HCC) 06/03/2022   Nondisplaced fracture of neck of fifth metacarpal bone, right hand, initial encounter for closed fracture 08/26/2017   Pancreatitis    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   PTSD (post-traumatic stress disorder)    Tobacco abuse 09/04/2016   Tobacco abuse counseling    Vascular disease     Patient Active Problem List   Diagnosis Date Noted   Intractable nausea and vomiting 10/30/2022   Malnutrition of moderate degree 06/05/2022   Essential  (primary) hypertension 03/16/2021   Smoker 03/16/2021   History of carotid endarterectomy 03/16/2021   Coronary artery disease 03/02/2021   Depression 03/02/2021   Carotid artery stenosis 01/26/2021   Anxiety    Cancer (HCC)    Cirrhosis (HCC)    GERD (gastroesophageal reflux disease)    PTSD (post-traumatic stress disorder)    Vascular disease    Bipolar 1 disorder (HCC) 01/13/2019   Eczema 10/09/2018   Acute pancreatitis    Tobacco abuse counseling    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 Early 20's. Normal   ENDARTERECTOMY Right 01/26/2021   Procedure: RIGHT CAROTID ARTERY ENDARTERECTOMY;  Surgeon: Maeola Harman, MD;  Location: [facility] OR;  Service: Vascular;  Laterality: Right;   ESOPHAGOGASTRODUODENOSCOPY (EGD) WITH PROPOFOL N/A 01/02/2018   Procedure: ESOPHAGOGASTRODUODENOSCOPY (EGD) WITH PROPOFOL;  Surgeon: [name], MD;  Location: [facility];  Service: Endoscopy;  Laterality: N/A;   EUS N/A 01/02/2018  Procedure: UPPER ENDOSCOPIC ULTRASOUND (EUS) RADIAL;  Surgeon: [name], MD;  Location: [facility];  Service: Endoscopy;  Laterality: N/A;   FOOT SURGERY     HIP PINNING,CANNULATED Left 06/05/2022   Procedure: PERCUTANEOUS FIXATION OF FEMORAL NECK;  Surgeon: Durene Romans, MD;  Location: [facility] OR;  Service: Orthopedics;  Laterality: Left;   OTHER SURGICAL HISTORY     ovarian surgery   TUBAL LIGATION      OB History   No obstetric history on file.      Home  Medications    Prior to Admission medications   Medication Sig Start Date End Date Taking? Authorizing Provider  traMADol (ULTRAM) 50 MG tablet Take 1 tablet (50 mg total) by mouth every 6 (six) hours as needed for up to 3 days. 01/31/23 02/03/23 Yes Devann Cribb, Lurena Joiner, PA-C  albuterol (VENTOLIN HFA) 108 (90 Base) MCG/ACT inhaler Inhale 1-2 puffs into the lungs every 6 (six) hours as needed for wheezing or shortness of breath. 12/04/22   Hoy Register, MD  alum & mag hydroxide-simeth (MAALOX PLUS) 400-400-40 MG/5ML suspension Take 15 mLs by mouth every 6 (six) hours as needed for indigestion.    [provider]  clopidogrel (PLAVIX) 75 MG tablet TAKE 1 TABLET BY MOUTH EVERY DAY 12/31/22   Chuck Hint, MD  famotidine (PEPCID) 20 MG tablet Take 1 tablet (20 mg total) by mouth 2 (two) times daily. 06/07/22   Hughie Closs, MD  mometasone-formoterol (DULERA) 200-5 MCG/ACT AERO Inhale 2 puffs into the lungs daily as needed. 12/04/22   Hoy Register, MD  ondansetron (ZOFRAN) 4 MG tablet Take 1 tablet (4 mg total) by mouth every 8 (eight) hours as needed for nausea or vomiting. 11/01/22   Rai, Ripudeep K, MD  OVER THE COUNTER MEDICATION Take 1 capsule by mouth daily. [employer], Historical, MD  pantoprazole (PROTONIX) 40 MG tablet Take 1 tablet (40 mg total) by mouth daily. 11/01/22   Rai, Ripudeep K, MD  polyethylene glycol (MIRALAX / GLYCOLAX) 17 g packet Take 17 g by mouth daily. Patient taking differently: Take 17 g by mouth daily as needed for moderate constipation. 06/07/22   Cassandria Anger, PA-C  rosuvastatin (CRESTOR) 10 MG tablet TAKE 1 TABLET BY MOUTH EVERY DAY 12/31/22   Chuck Hint, MD  cloNIDine (CATAPRES) 0.1 MG tablet Take 1 tablet (0.1 mg total) by mouth at bedtime. For hot flashes Patient not taking: Reported on 10/17/2019 01/13/19 08/15/20  Hoy Register, MD    Family History Family History  Problem Relation Age of Onset   Hypertension Mother     Breast cancer Mother 48   Pancreatitis Mother    Hypertension Father    Heart attack Cousin 30       died in 56s of MI   Breast cancer Maternal Aunt    Breast cancer Maternal Aunt    Colon cancer Neg Hx    Esophageal cancer Neg Hx    Rectal cancer Neg Hx    Stomach cancer Neg Hx     Social History Social History   Tobacco Use   Smoking status: Every Day    Packs/day: .25    Types: Cigarettes   Smokeless tobacco: Never   Tobacco comments:    5-6     cigarettes daily  Vaping Use   Vaping Use: Never used  Substance Use Topics   Alcohol use: Yes    Comment: 2 glasses   Drug use: No  Allergies   Patient has no known allergies.   Review of Systems Review of Systems As per HPI  Physical Exam Triage Vital Signs ED Triage Vitals  Enc Vitals Group     BP 01/31/23 1356 (!) 165/93     Pulse Rate 01/31/23 1354 61     Resp 01/31/23 1354 16     Temp 01/31/23 1354 98.3 F (36.8 C)     Temp Source 01/31/23 1354 Oral     SpO2 01/31/23 1354 99 %     Weight --      Height --      Head Circumference --      Peak Flow --      Pain Score 01/31/23 1355 9     Pain Loc --      Pain Edu? --      Excl. in GC? --    No data found.  Updated Vital Signs BP (!) 165/93 (BP Location: Left Arm)   Pulse 61   Temp 98.3 F (36.8 C) (Oral)   Resp 16   SpO2 99%   Physical Exam Vitals and nursing note reviewed.  Constitutional:      General: She is not in acute distress. HENT:     Mouth/Throat:     Pharynx: Oropharynx is clear.  Cardiovascular:     Rate and Rhythm: Normal rate and regular rhythm.     Pulses: Normal pulses.     Heart sounds: Normal heart sounds.  Pulmonary:     Effort: Pulmonary effort is normal.     Breath sounds: Normal breath sounds.  Musculoskeletal:     Cervical back: Normal range of motion.     Left hip: No deformity or tenderness. Decreased range of motion. Normal strength.     Comments: Limited ROM of left hip due to pain. Cannot externally  rotate without pain. Weight bearing increases pain. No LE swelling. Non tender over anterior thigh, groin, iliac crest. Antalgic gait.   Skin:    General: Skin is warm and dry.     Capillary Refill: Capillary refill takes less than 2 seconds.     Findings: No bruising or erythema.     Comments: No ecchymosis   Neurological:     Mental Status: She is alert and oriented to person, place, and time.     Gait: Gait abnormal.     Comments: Distal sensation intact. Good DP pulse     UC Treatments / Results  Labs (all labs ordered are listed, but only abnormal results are displayed) Labs Reviewed - No data to display  EKG  Radiology DG Hip Unilat W or Wo Pelvis 2-3 Views Left  Result Date: 01/31/2023 CLINICAL DATA:  Left hip pain for the last 2 weeks. EXAM: DG HIP (WITH OR WITHOUT PELVIS) 2-3V LEFT COMPARISON:  CT pelvis 10/29/2022 FINDINGS: Three cannulated screw fixators noted in the left hip. Along the lateral threaded portion of the upper most screw there is surrounding lucency along with mild cortical irregularity and potentially slight cortical flattening along the lateral portion of the left femoral head. This could be from loosening, infection, or localized bony subsidence. No additional significant findings. IMPRESSION: 1. Lucency along the lateral threaded portion of the upper most screw in the left hip, with mild overlying femoral head cortical irregularity and potentially slight cortical flattening along the lateral portion of the left femoral head. This could be from loosening, infection, or localized bony subsidence. Electronically Signed   By: Gaylyn Rong  M.D.   On: 01/31/2023 15:09    Procedures Procedures   Medications Ordered in UC Medications - No data to display  Initial Impression / Assessment and Plan / UC Course  I have reviewed the triage vital signs and the nursing notes.  Pertinent labs & imaging results that were available during my care of the patient were  reviewed by me and considered in my medical decision making (see chart for details).  Xray today with lucency near top screw. Images independently reviewed by me, agree with radiology interpretation. Unknown etiology, could be post surgical changes, post traumatic arthritis vs more emergency etiology such as infection or AVN. Afebrile at this time and able to ambulate (with pain).  [facility] appointment is not until 7/10. I have advised her to go first thing tomorrow morning to their walk-in clinic. Needs to have more imaging done to evaluate.  In the meantime sent a couple tablets tramadol to use only before bedtime. Discussed narcotic medication use and side effects. PDMP reviewed.  ED precautions if any acute worsening tonight.  Patient agrees to plan. No questions at this time  Final Clinical Impressions(s) / UC Diagnoses   Final diagnoses:  Left hip pain  Closed fracture of neck of left femur, sequela     Discharge Instructions      Please go to the [facility] clinic first thing tomorrow morning. I would like you to have further imaging to evaluate your pain.  I have sent a couple tablets of tramadol to your pharmacy.  This is a narcotic medication.  It has addictive properties and can make you drowsy.  DO NOT drink, drive, make judgment decisions, or take other sedating medications while using the narcotic.  Please only use for breakthrough pain or once at bedtime if needed.   Xray report: Lucency along the lateral threaded portion of the upper most screw in the left hip, with mild overlying femoral head cortical irregularity and potentially slight cortical flattening along the lateral portion of the left femoral head. This could be from loosening, infection, or localized bony subsidence.      ED Prescriptions     Medication Sig Dispense Auth. Provider   traMADol (ULTRAM) 50 MG tablet Take 1 tablet (50 mg total) by mouth every 6 (six) hours as needed for up to 3 days. 6  tablet Casy Brunetto, Lurena Joiner, PA-C      I have reviewed the PDMP during this encounter.   Kealy Lewter, Lurena Joiner, [place] 01/31/23 1735

## 2023-02-01 DIAGNOSIS — M25552 Pain in left hip: Secondary | ICD-10-CM | POA: Diagnosis not present

## 2023-02-20 ENCOUNTER — Other Ambulatory Visit: Payer: Self-pay | Admitting: Vascular Surgery

## 2023-02-20 DIAGNOSIS — Z4789 Encounter for other orthopedic aftercare: Secondary | ICD-10-CM | POA: Diagnosis not present

## 2023-02-20 DIAGNOSIS — M25552 Pain in left hip: Secondary | ICD-10-CM | POA: Diagnosis not present

## 2023-03-06 ENCOUNTER — Encounter: Payer: Self-pay | Admitting: Family Medicine

## 2023-03-07 ENCOUNTER — Telehealth: Payer: Self-pay | Admitting: *Deleted

## 2023-03-07 NOTE — Telephone Encounter (Signed)
Left message to call back to schedule an In office appt for pre op clearance .

## 2023-03-07 NOTE — Telephone Encounter (Signed)
   Name: Felicia Perkins  DOB: 1976-04-07  MRN: [id number]  Primary Cardiologist: Thomasene Ripple, DO  Chart reviewed as part of pre-operative protocol coverage. Because of Felicia Perkins's past medical history and time since last visit, she will require a follow-up in-office visit in order to better assess preoperative cardiovascular risk. Last seen by Dr. Servando Salina on 03/15/2021.   Pre-op covering staff: - Please schedule appointment and call patient to inform them. If patient already had an upcoming appointment within acceptable timeframe, please add "pre-op clearance" to the appointment notes so provider is aware. - Please contact requesting surgeon's office via preferred method (i.e, phone, fax) to inform them of need for appointment prior to surgery.  This message will also be routed to pharmacy pool and/or Dr Servando Salina for input on holding Plavix as requested below so that this information is available to the clearing provider at time of patient's appointment.   Joni Reining, NP  03/07/2023, 4:27 PM

## 2023-03-07 NOTE — Telephone Encounter (Signed)
   Pre-operative Risk Assessment    Patient Name: Felicia Perkins  DOB: 04-29-1976 MRN: [id number]      Request for Surgical Clearance    Procedure:   CONVERSION TO LEFT HIP ARTHROPLASTY  Date of Surgery:  Clearance 04/18/23                                 Surgeon:  DR. MATTHEW OLIN Surgeon's Group or Practice Name:  [facility] Phone number:  [phone] ATTN: [name] Fax number:  [phone] AND [phone]   Type of Clearance Requested:   - Medical ; PLAVIX   Type of Anesthesia:  Spinal   Additional requests/questions:    [name]   03/07/2023, 4:03 PM

## 2023-03-08 NOTE — Telephone Encounter (Signed)
2nd attempt to reach pt to call back and schedule an appt IN OFFICE for pre op clearance.

## 2023-03-10 ENCOUNTER — Inpatient Hospital Stay (HOSPITAL_COMMUNITY)
Admission: EM | Admit: 2023-03-10 | Discharge: 2023-03-13 | DRG: 438 | Disposition: A | Discharge status: Home / Self Care | Payer: 59 | Attending: Family Medicine | Admitting: Family Medicine

## 2023-03-10 ENCOUNTER — Emergency Department (HOSPITAL_COMMUNITY): Payer: 59

## 2023-03-10 ENCOUNTER — Other Ambulatory Visit: Payer: Self-pay

## 2023-03-10 DIAGNOSIS — K860 Alcohol-induced chronic pancreatitis: Secondary | ICD-10-CM | POA: Diagnosis present

## 2023-03-10 DIAGNOSIS — Z7902 Long term (current) use of antithrombotics/antiplatelets: Secondary | ICD-10-CM

## 2023-03-10 DIAGNOSIS — K7030 Alcoholic cirrhosis of liver without ascites: Secondary | ICD-10-CM | POA: Diagnosis present

## 2023-03-10 DIAGNOSIS — Z803 Family history of malignant neoplasm of breast: Secondary | ICD-10-CM

## 2023-03-10 DIAGNOSIS — Z8249 Family history of ischemic heart disease and other diseases of the circulatory system: Secondary | ICD-10-CM

## 2023-03-10 DIAGNOSIS — K852 Alcohol induced acute pancreatitis without necrosis or infection: Secondary | ICD-10-CM | POA: Diagnosis not present

## 2023-03-10 DIAGNOSIS — E43 Unspecified severe protein-calorie malnutrition: Secondary | ICD-10-CM | POA: Insufficient documentation

## 2023-03-10 DIAGNOSIS — I10 Essential (primary) hypertension: Secondary | ICD-10-CM | POA: Diagnosis present

## 2023-03-10 DIAGNOSIS — K8689 Other specified diseases of pancreas: Secondary | ICD-10-CM | POA: Diagnosis not present

## 2023-03-10 DIAGNOSIS — J438 Other emphysema: Secondary | ICD-10-CM

## 2023-03-10 DIAGNOSIS — Z8541 Personal history of malignant neoplasm of cervix uteri: Secondary | ICD-10-CM

## 2023-03-10 DIAGNOSIS — R7989 Other specified abnormal findings of blood chemistry: Secondary | ICD-10-CM

## 2023-03-10 DIAGNOSIS — K700 Alcoholic fatty liver: Secondary | ICD-10-CM | POA: Diagnosis present

## 2023-03-10 DIAGNOSIS — R9431 Abnormal electrocardiogram [ECG] [EKG]: Secondary | ICD-10-CM | POA: Diagnosis not present

## 2023-03-10 DIAGNOSIS — E872 Acidosis, unspecified: Secondary | ICD-10-CM | POA: Insufficient documentation

## 2023-03-10 DIAGNOSIS — K861 Other chronic pancreatitis: Secondary | ICD-10-CM

## 2023-03-10 DIAGNOSIS — K859 Acute pancreatitis without necrosis or infection, unspecified: Principal | ICD-10-CM | POA: Diagnosis present

## 2023-03-10 DIAGNOSIS — Z79899 Other long term (current) drug therapy: Secondary | ICD-10-CM

## 2023-03-10 DIAGNOSIS — Z7951 Long term (current) use of inhaled steroids: Secondary | ICD-10-CM

## 2023-03-10 DIAGNOSIS — E876 Hypokalemia: Secondary | ICD-10-CM | POA: Diagnosis present

## 2023-03-10 DIAGNOSIS — F1020 Alcohol dependence, uncomplicated: Secondary | ICD-10-CM | POA: Diagnosis present

## 2023-03-10 DIAGNOSIS — K219 Gastro-esophageal reflux disease without esophagitis: Secondary | ICD-10-CM | POA: Diagnosis present

## 2023-03-10 DIAGNOSIS — F17210 Nicotine dependence, cigarettes, uncomplicated: Secondary | ICD-10-CM | POA: Diagnosis present

## 2023-03-10 DIAGNOSIS — Z681 Body mass index (BMI) 19 or less, adult: Secondary | ICD-10-CM

## 2023-03-10 DIAGNOSIS — E785 Hyperlipidemia, unspecified: Secondary | ICD-10-CM | POA: Diagnosis present

## 2023-03-10 DIAGNOSIS — F4310 Post-traumatic stress disorder, unspecified: Secondary | ICD-10-CM | POA: Diagnosis present

## 2023-03-10 DIAGNOSIS — K760 Fatty (change of) liver, not elsewhere classified: Secondary | ICD-10-CM | POA: Diagnosis not present

## 2023-03-10 DIAGNOSIS — I2510 Atherosclerotic heart disease of native coronary artery without angina pectoris: Secondary | ICD-10-CM | POA: Diagnosis present

## 2023-03-10 DIAGNOSIS — F319 Bipolar disorder, unspecified: Secondary | ICD-10-CM | POA: Diagnosis present

## 2023-03-10 DIAGNOSIS — J449 Chronic obstructive pulmonary disease, unspecified: Secondary | ICD-10-CM | POA: Diagnosis present

## 2023-03-10 DIAGNOSIS — Z90710 Acquired absence of both cervix and uterus: Secondary | ICD-10-CM

## 2023-03-10 DIAGNOSIS — Z1152 Encounter for screening for COVID-19: Secondary | ICD-10-CM

## 2023-03-10 DIAGNOSIS — R7402 Elevation of levels of lactic acid dehydrogenase (LDH): Secondary | ICD-10-CM | POA: Diagnosis not present

## 2023-03-10 LAB — COMPREHENSIVE METABOLIC PANEL
ALT: 14 U/L (ref 0–44)
AST: 25 U/L (ref 15–41)
Albumin: 4.3 g/dL (ref 3.5–5.0)
Alkaline Phosphatase: 86 U/L (ref 38–126)
Anion gap: 19 — ABNORMAL HIGH (ref 5–15)
BUN: 7 mg/dL (ref 6–20)
CO2: 21 mmol/L — ABNORMAL LOW (ref 22–32)
Calcium: 9.3 mg/dL (ref 8.9–10.3)
Chloride: 99 mmol/L (ref 98–111)
Creatinine, Ser: <0.3 mg/dL — ABNORMAL LOW (ref 0.44–1.00)
Glucose, Bld: 120 mg/dL — ABNORMAL HIGH (ref 70–99)
Potassium: 3.4 mmol/L — ABNORMAL LOW (ref 3.5–5.1)
Sodium: 139 mmol/L (ref 135–145)
Total Bilirubin: 0.7 mg/dL (ref 0.3–1.2)
Total Protein: 7.7 g/dL (ref 6.5–8.1)

## 2023-03-10 LAB — CBC
HCT: 43.5 % (ref 36.0–46.0)
Hemoglobin: 15 g/dL (ref 12.0–15.0)
MCH: 34 pg (ref 26.0–34.0)
MCHC: 34.5 g/dL (ref 30.0–36.0)
MCV: 98.6 fL (ref 80.0–100.0)
Platelets: 339 10*3/uL (ref 150–400)
RBC: 4.41 MIL/uL (ref 3.87–5.11)
RDW: 14.7 % (ref 11.5–15.5)
WBC: 8.7 10*3/uL (ref 4.0–10.5)
nRBC: 0 % (ref 0.0–0.2)

## 2023-03-10 LAB — URINALYSIS, ROUTINE W REFLEX MICROSCOPIC
Bilirubin Urine: NEGATIVE
Glucose, UA: NEGATIVE mg/dL
Hgb urine dipstick: NEGATIVE
Ketones, ur: NEGATIVE mg/dL
Leukocytes,Ua: NEGATIVE
Nitrite: NEGATIVE
Protein, ur: NEGATIVE mg/dL
Specific Gravity, Urine: 1.014 (ref 1.005–1.030)
pH: 8 (ref 5.0–8.0)

## 2023-03-10 LAB — SARS CORONAVIRUS 2 BY RT PCR: SARS Coronavirus 2 by RT PCR: NEGATIVE

## 2023-03-10 LAB — I-STAT CG4 LACTIC ACID, ED
Lactic Acid, Venous: 3.9 mmol/L (ref 0.5–1.9)
Lactic Acid, Venous: 2.7 mmol/L (ref 0.5–1.9)
Lactic Acid, Venous: 1.9 mmol/L (ref 0.5–1.9)

## 2023-03-10 LAB — PROTIME-INR
INR: 1 (ref 0.8–1.2)
Prothrombin Time: 13.6 seconds (ref 11.4–15.2)

## 2023-03-10 LAB — LIPASE, BLOOD: Lipase: 144 U/L — ABNORMAL HIGH (ref 11–51)

## 2023-03-10 MED ORDER — ADULT MULTIVITAMIN W/MINERALS CH
1.0000 | ORAL_TABLET | Freq: Every day | ORAL | Status: DC
  Administered 2023-03-10 – 2023-03-13 (×4): 1 via ORAL
  Filled 2023-03-10 (×4): qty 1

## 2023-03-10 MED ORDER — HYDRALAZINE HCL 10 MG PO TABS: 10.0000 mg | ORAL_TABLET | Freq: Four times a day (QID) | ORAL | Status: DC | PRN

## 2023-03-10 MED ORDER — MORPHINE SULFATE (PF) 4 MG/ML IV SOLN
4.0000 mg | Freq: Once | INTRAVENOUS | Status: AC
  Administered 2023-03-10: 4 mg via INTRAVENOUS
  Filled 2023-03-10: qty 1

## 2023-03-10 MED ORDER — POTASSIUM CHLORIDE CRYS ER 20 MEQ PO TBCR
40.0000 meq | EXTENDED_RELEASE_TABLET | Freq: Once | ORAL | Status: AC
  Administered 2023-03-10: 40 meq via ORAL
  Filled 2023-03-10: qty 2

## 2023-03-10 MED ORDER — PROMETHAZINE HCL 25 MG PO TABS
12.5000 mg | ORAL_TABLET | Freq: Four times a day (QID) | ORAL | Status: DC | PRN
  Administered 2023-03-11 – 2023-03-12 (×3): 12.5 mg via ORAL
  Filled 2023-03-10 (×3): qty 1

## 2023-03-10 MED ORDER — CLOPIDOGREL BISULFATE 75 MG PO TABS
75.0000 mg | ORAL_TABLET | Freq: Every day | ORAL | Status: DC
  Administered 2023-03-11 – 2023-03-13 (×3): 75 mg via ORAL
  Filled 2023-03-10 (×3): qty 1

## 2023-03-10 MED ORDER — NICOTINE 14 MG/24HR TD PT24
14.0000 mg | MEDICATED_PATCH | Freq: Every day | TRANSDERMAL | Status: DC
  Administered 2023-03-10 – 2023-03-13 (×4): 14 mg via TRANSDERMAL
  Filled 2023-03-10 (×4): qty 1

## 2023-03-10 MED ORDER — ENOXAPARIN SODIUM 40 MG/0.4ML IJ SOSY
40.0000 mg | PREFILLED_SYRINGE | INTRAMUSCULAR | Status: DC
  Administered 2023-03-11 – 2023-03-12 (×2): 40 mg via SUBCUTANEOUS
  Filled 2023-03-10 (×2): qty 0.4

## 2023-03-10 MED ORDER — SODIUM CHLORIDE (PF) 0.9 % IJ SOLN
INTRAMUSCULAR | Status: AC
  Filled 2023-03-10: qty 50

## 2023-03-10 MED ORDER — POLYETHYLENE GLYCOL 3350 17 G PO PACK
17.0000 g | PACK | Freq: Every day | ORAL | Status: DC | PRN
  Administered 2023-03-12: 17 g via ORAL
  Filled 2023-03-10: qty 1

## 2023-03-10 MED ORDER — MORPHINE SULFATE (PF) 4 MG/ML IV SOLN
3.0000 mg | Freq: Four times a day (QID) | INTRAVENOUS | Status: DC | PRN
  Administered 2023-03-11 (×2): 3 mg via INTRAVENOUS
  Filled 2023-03-10 (×2): qty 1

## 2023-03-10 MED ORDER — LORAZEPAM 1 MG PO TABS
1.0000 mg | ORAL_TABLET | ORAL | Status: DC | PRN
  Administered 2023-03-11: 1 mg via ORAL
  Filled 2023-03-10: qty 1

## 2023-03-10 MED ORDER — ACETAMINOPHEN 325 MG PO TABS
650.0000 mg | ORAL_TABLET | Freq: Four times a day (QID) | ORAL | Status: DC | PRN
  Administered 2023-03-12: 650 mg via ORAL
  Filled 2023-03-10: qty 2

## 2023-03-10 MED ORDER — PANTOPRAZOLE SODIUM 40 MG PO TBEC
40.0000 mg | DELAYED_RELEASE_TABLET | Freq: Every day | ORAL | Status: DC
  Administered 2023-03-10 – 2023-03-13 (×4): 40 mg via ORAL
  Filled 2023-03-10 (×4): qty 1

## 2023-03-10 MED ORDER — ALBUTEROL SULFATE (2.5 MG/3ML) 0.083% IN NEBU
3.0000 mL | INHALATION_SOLUTION | Freq: Four times a day (QID) | RESPIRATORY_TRACT | Status: DC | PRN
  Administered 2023-03-11: 3 mL via RESPIRATORY_TRACT
  Filled 2023-03-10: qty 3

## 2023-03-10 MED ORDER — FAMOTIDINE 20 MG PO TABS
20.0000 mg | ORAL_TABLET | Freq: Two times a day (BID) | ORAL | Status: DC
  Administered 2023-03-11 – 2023-03-13 (×6): 20 mg via ORAL
  Filled 2023-03-10 (×6): qty 1

## 2023-03-10 MED ORDER — IOHEXOL 300 MG/ML  SOLN
100.0000 mL | Freq: Once | INTRAMUSCULAR | Status: AC | PRN
  Administered 2023-03-10: 100 mL via INTRAVENOUS

## 2023-03-10 MED ORDER — ONDANSETRON HCL 4 MG/2ML IJ SOLN
4.0000 mg | Freq: Once | INTRAMUSCULAR | Status: AC
  Administered 2023-03-10: 4 mg via INTRAVENOUS
  Filled 2023-03-10: qty 2

## 2023-03-10 MED ORDER — ONDANSETRON HCL 4 MG PO TABS: 4.0000 mg | ORAL_TABLET | Freq: Four times a day (QID) | ORAL | 0 refills | Status: DC

## 2023-03-10 MED ORDER — POTASSIUM CHLORIDE CRYS ER 20 MEQ PO TBCR
20.0000 meq | EXTENDED_RELEASE_TABLET | Freq: Once | ORAL | Status: AC
  Administered 2023-03-10: 20 meq via ORAL
  Filled 2023-03-10: qty 1

## 2023-03-10 MED ORDER — HYDROCODONE-ACETAMINOPHEN 5-325 MG PO TABS: 1.0000 | ORAL_TABLET | Freq: Once | ORAL | Status: DC

## 2023-03-10 MED ORDER — LACTATED RINGERS IV BOLUS
1000.0000 mL | Freq: Once | INTRAVENOUS | Status: AC
  Administered 2023-03-10: 1000 mL via INTRAVENOUS

## 2023-03-10 MED ORDER — LACTATED RINGERS IV SOLN: INTRAVENOUS | Status: AC

## 2023-03-10 MED ORDER — LORAZEPAM 0.5 MG PO TABS
0.5000 mg | ORAL_TABLET | ORAL | Status: DC | PRN
  Administered 2023-03-11 – 2023-03-13 (×7): 0.5 mg via ORAL
  Filled 2023-03-10 (×7): qty 1

## 2023-03-10 MED ORDER — ACETAMINOPHEN 650 MG RE SUPP: 650.0000 mg | Freq: Four times a day (QID) | RECTAL | Status: DC | PRN

## 2023-03-10 MED ORDER — ONDANSETRON HCL 4 MG/2ML IJ SOLN
4.0000 mg | Freq: Once | INTRAMUSCULAR | Status: AC | PRN
  Administered 2023-03-10: 4 mg via INTRAVENOUS
  Filled 2023-03-10: qty 2

## 2023-03-10 MED ORDER — OXYCODONE HCL 5 MG PO TABS
5.0000 mg | ORAL_TABLET | ORAL | Status: DC | PRN
  Administered 2023-03-11 – 2023-03-13 (×10): 5 mg via ORAL
  Filled 2023-03-10 (×10): qty 1

## 2023-03-10 MED ORDER — HYDROCODONE-ACETAMINOPHEN 5-325 MG PO TABS: 1.0000 | ORAL_TABLET | Freq: Four times a day (QID) | ORAL | 0 refills | Status: AC | PRN

## 2023-03-10 NOTE — ED Provider Triage Note (Signed)
Emergency Medicine Provider Triage Evaluation Note  Felicia Perkins , a 47 y.o. female  was evaluated in triage.  Pt complains of epigastric pain with nausea vomiting that began with chills.  Patient states that she has history of pancreatitis and this feels very similar.  Patient that she has not been able to eat or drink for the past few days due to her symptoms.  Patient denied hematemesis, hematochezia, chest pain, shortness of breath..  Review of Systems  Positive: See HPI Negative: See HPI  Physical Exam  BP (!) 169/111 (BP Location: Left Arm)   Pulse 74   Temp 98.4 F (36.9 C) (Oral)   Resp 18   Ht 5\' 4"  (1.626 m)   Wt 50.3 kg   SpO2 100%   BMI 19.05 kg/m  Gen:   Awake, distress Resp:  Normal effort  MSK:   Moves extremities without difficulty  Other:  Epigastric guarding  Medical Decision Making  Medically screening exam initiated at 11:02 AM.  Appropriate orders placed.  Felicia Perkins was informed that the remainder of the evaluation will be completed by another provider, this initial triage assessment does not replace that evaluation, and the importance of remaining in the ED until their evaluation is complete.  Workup initiated, patient stable at this time   Remi Deter 03/10/23 1104

## 2023-03-10 NOTE — ED Triage Notes (Signed)
Pt arrived via POV. C/o central abd pain, and N/V since this AM. Pt had diarrhea for 1x week, which has now resolved. Pt states this feels like an episode of pancreatitis.   AOx4

## 2023-03-10 NOTE — H&P (Addendum)
History and Physical    Felicia Perkins NGE:[id number] DOB: 1976-03-06 DOA: 03/10/2023  PCP: Hoy Register, MD  Patient coming from: Home Chief Complaint: Abdominal pain, nausea, vomiting  HPI: Felicia Perkins is a 47 y.o. female with a pertinent history of   Patient is a 47 year old female with a history of alcoholic dependence and cirrhosis, pancreatitis d/t etoh use, CAD, HTN, COPD, anxiety, grieving from daughter who passed away back last 06/01/2023 in a errant shooting.  And psoriasis, she presented to the ED today with nausea, vomiting, abdominal pain. About a week ago she had diarrhea, was not sure if it was a stomach bug or food poisoning after she had 2 margaritas.  She had 4-5 bowel movements per day, maybe some chills but no fevers.  No hematochezia or melena.  This resolved to constipation and had to take some laxatives to improve this.  She does not think her abdominal pains are related to this.  This morning she developed nausea, vomiting, abdominal pain that was felt similar to her previous pancreatitis episodes.  She states that she has been doing well with alcohol until her late daughter's death back in 06/01/2023 which is caused some recurrence.  I encouraged her to stop drinking as she has such a low threshold to going to pancreatitis and not to mention the cirrhosis she states that she has not been drinking recently.  Her last drink was 4 to 5 days ago and she did not have any withdrawals or seizures.  She states that there has not really been withdrawals or seizures in the past.  Presented back in March for similar\  She works at [employer] and a stomach bug was going around.    In the ED 169/111 which improved to 161/101, HR 81, RR 16, 100% on room air.  NA 139, K3.4, CL 99, CO2 21, glucose, lipase 144, otherwise liver numbers okay.  Lactic acid initially 3.9 which improved to 2.7 after 1L.  UA was negative for infection. CT scan showed developing stranding and fluid surrounding  the pancreatic head and neck region consistent with acute component of pancreatitis and some calcifications consistent with chronic pancreatitis without any well-defined fluid collection Left-sided hip pins identified with sclerosis and lucency and irregularity along the left femoral head which is new from the previous examination and please correlate for any developing AVN. - 01/31/2023 patient had some x-rays of the hip done She received some IV morphine x 2 in the emergency department and potassium orally and some Zofran.  2 L of IV fluid    Review of Systems: As per HPI otherwise 10 point review of systems negative.  Other pertinents as below:  General -denies any weight change, has a good appetite typically HEENT -h denies any new headaches, visual changes Cardio -no chest pain, palpitations Resp -denies any cough or shortness of breath or respiratory infection GI -has nausea, vomiting, nonbloody, GU -denies any urinary complaints like dysuria or frequency MSK -denies any joint pains or weakness Skin -has psoriasis flaring up Neuro -denies any new numbness or weakness Psych -denies any anxiety or depression currently but has been grieving   Past Medical History:  Diagnosis Date   Acute on chronic pancreatitis (HCC) 03/22/2019   Acute pancreatitis    Alcohol abuse    Alcohol dependence (HCC) 12/16/2012   Medical detox successful    Alcohol induced acute pancreatitis 11/22/2017   Alcoholic cirrhosis of liver without ascites (HCC) 10/15/2016   Anxiety    Aortic  atherosclerosis (HCC)    Bipolar 1 disorder (HCC)    Cancer (HCC)    cervical cancer   Chest pain with moderate risk for cardiac etiology 12/15/2014   Cirrhosis (HCC)    COPD (chronic obstructive pulmonary disease) (HCC) 09/04/2016   Coronary artery disease    Depression    Duodenitis 11/22/2017   Eczema 10/09/2018   Elevated liver enzymes 12/22/2014   Fracture of femoral neck, left (HCC) 06/04/2022   Gastritis and  gastroduodenitis 09/04/2016   GERD (gastroesophageal reflux disease)    Hip fracture (HCC) 06/03/2022   Nondisplaced fracture of neck of fifth metacarpal bone, right hand, initial encounter for closed fracture 08/26/2017   Pancreatitis    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 Early 20's. Normal   ENDARTERECTOMY Right 01/26/2021   Procedure: RIGHT CAROTID ARTERY ENDARTERECTOMY;  Surgeon: Maeola Harman, MD;  Location: [facility] OR;  Service: Vascular;  Laterality: Right;   ESOPHAGOGASTRODUODENOSCOPY (EGD) WITH PROPOFOL N/A 01/02/2018   Procedure: ESOPHAGOGASTRODUODENOSCOPY (EGD) WITH PROPOFOL;  Surgeon: [name], MD;  Location: [facility];  Service: Endoscopy;  Laterality: N/A;   EUS N/A 01/02/2018   Procedure: UPPER ENDOSCOPIC ULTRASOUND (EUS) RADIAL;  Surgeon: [name], MD;  Location: [facility];  Service: Endoscopy;  Laterality: N/A;   FOOT SURGERY     HIP PINNING,CANNULATED Left 06/05/2022   Procedure: PERCUTANEOUS FIXATION OF FEMORAL NECK;  Surgeon: Durene Romans, MD;  Location: [facility] OR;  Service: Orthopedics;  Laterality: Left;   OTHER SURGICAL HISTORY     ovarian surgery   TUBAL LIGATION       reports that she has been smoking cigarettes. She has never used smokeless tobacco. She reports current alcohol use. She reports that she does not use drugs.  No Known Allergies  Family History  Problem Relation Age of Onset   Hypertension Mother    Breast cancer Mother 84   Pancreatitis Mother    Hypertension Father    Heart attack Cousin 30       died in 36s of MI   Breast cancer Maternal Aunt     Breast cancer Maternal Aunt    Colon cancer Neg Hx    Esophageal cancer Neg Hx    Rectal cancer Neg Hx    Stomach cancer Neg Hx     Prior to Admission medications   Medication Sig Start Date End Date Taking? Authorizing Provider  HYDROcodone-acetaminophen (NORCO) 5-325 MG tablet Take 1 tablet by mouth every 6 (six) hours as needed for up to 3 days for moderate pain. 03/10/23 03/13/23 Yes Schuman, Beverly Gust, PA-C  ondansetron (ZOFRAN) 4 MG tablet Take 1 tablet (4 mg total) by mouth every 6 (six) hours. 03/10/23  Yes Schuman, Beverly Gust, PA-C  albuterol (VENTOLIN HFA) 108 (90 Base) MCG/ACT inhaler Inhale 1-2 puffs into the lungs every 6 (six) hours as needed for wheezing or shortness of breath. 12/04/22   Hoy Register, MD  alum & mag hydroxide-simeth (MAALOX PLUS) 400-400-40 MG/5ML suspension Take 15 mLs by mouth every 6 (six) hours as needed for indigestion.    [provider]  clopidogrel (PLAVIX) 75 MG tablet TAKE 1 TABLET BY MOUTH EVERY DAY 12/31/22   Chuck Hint, MD  famotidine (PEPCID) 20 MG tablet Take 1 tablet (20 mg total) by mouth 2 (two) times daily. 06/07/22   Hughie Closs, MD  mometasone-formoterol (DULERA) 200-5 MCG/ACT AERO Inhale 2 puffs into the lungs daily as needed. 12/04/22   Hoy Register, MD  OVER THE COUNTER MEDICATION Take 1 capsule by mouth daily. [employer], Historical, MD  pantoprazole (PROTONIX) 40 MG tablet Take 1 tablet (40 mg total) by mouth daily. 11/01/22   Rai, Ripudeep K, MD  polyethylene glycol (MIRALAX / GLYCOLAX) 17 g packet Take 17 g by mouth daily. Patient taking differently: Take 17 g by mouth daily as needed for moderate constipation. 06/07/22   Cassandria Anger, PA-C  rosuvastatin (CRESTOR) 10 MG tablet TAKE 1 TABLET BY MOUTH EVERY DAY 12/31/22   Chuck Hint, MD  cloNIDine (CATAPRES) 0.1 MG tablet Take 1 tablet (0.1 mg total) by mouth at bedtime. For hot flashes Patient not taking: Reported on 10/17/2019 01/13/19 08/15/20   Hoy Register, MD    Physical Exam: Vitals:   03/10/23 1630 03/10/23 1830 03/10/23 1846 03/10/23 1900  BP: (!) 161/101 (!) 164/98 (!) 164/98 (!) 152/111  Pulse: 81  91 69  Resp: 16  16 16   Temp:      TempSrc:      SpO2: 100%  100% 99%  Weight:      Height:        Constitutional: NAD, comfortable, very pleasant, nontoxic-appearing Eyes: pupils equal and reactive to light, anicteric, without injection ENMT: MMM, throat without exudates or erythema Neck: normal, supple, no masses, no thyromegaly noted Respiratory: CTAB, nwob, Cardiovascular: rrr w/o mrg, warm extremities Abdomen: NBS, NT, some epigastric tenderness, no guarding, no acute abdomen signs Musculoskeletal: moving all 4 extremities, strength grossly intact 5/5 in the UE and LE's,  Skin: no rashes, lesions, ulcers. No induration Neurologic: CN 2-12 grossly intact. Sensation intact Psychiatric: AO appearing, mentation appropriate  Labs on Admission: I have personally reviewed following labs and imaging studies  CBC: Recent Labs  Lab 03/10/23 1130  WBC 8.7  HGB 15.0  HCT 43.5  MCV 98.6  PLT 339   Basic Metabolic Panel: Recent Labs  Lab 03/10/23 1130  NA 139  K 3.4*  CL 99  CO2 21*  GLUCOSE 120*  BUN 7  CREATININE <0.30*  CALCIUM 9.3   GFR: CrCl cannot be calculated (This lab value cannot be used to calculate CrCl because it is not a number: <0.30). Liver Function Tests: Recent Labs  Lab 03/10/23 1130  AST 25  ALT 14  ALKPHOS 86  BILITOT 0.7  PROT 7.7  ALBUMIN 4.3   Recent Labs  Lab 03/10/23 1130  LIPASE 144*   No results for input(s): "AMMONIA" in the last 168 hours. Coagulation Profile: No results for input(s): "INR", "PROTIME" in the last 168 hours. Cardiac Enzymes: No results for input(s): "CKTOTAL", "CKMB", "CKMBINDEX", "TROPONINI" in the last 168 hours. BNP (last 3 results) No results for input(s): "PROBNP" in the last 8760 hours. HbA1C: No results for input(s): "HGBA1C" in  the last 72 hours. CBG: No results for input(s): "GLUCAP" in the last 168 hours. Lipid Profile: No results for input(s): "CHOL", "HDL", "LDLCALC", "TRIG", "CHOLHDL", "LDLDIRECT" in the last 72 hours. Thyroid Function Tests: No results for input(s): "TSH", "T4TOTAL", "FREET4", "T3FREE", "THYROIDAB" in the last 72 hours. Anemia Panel: No results for  input(s): "VITAMINB12", "FOLATE", "FERRITIN", "TIBC", "IRON", "RETICCTPCT" in the last 72 hours. Urine analysis:    Component Value Date/Time   COLORURINE YELLOW 03/10/2023 1100   APPEARANCEUR CLOUDY (A) 03/10/2023 1100   LABSPEC 1.014 03/10/2023 1100   PHURINE 8.0 03/10/2023 1100   GLUCOSEU NEGATIVE 03/10/2023 1100   HGBUR NEGATIVE 03/10/2023 1100   BILIRUBINUR NEGATIVE 03/10/2023 1100   KETONESUR NEGATIVE 03/10/2023 1100   PROTEINUR NEGATIVE 03/10/2023 1100   UROBILINOGEN 1.0 09/14/2016 1531   NITRITE NEGATIVE 03/10/2023 1100   LEUKOCYTESUR NEGATIVE 03/10/2023 1100    Radiological Exams on Admission: CT ABDOMEN PELVIS W CONTRAST  Result Date: 03/10/2023 CLINICAL DATA:  Pancreatitis. EXAM: CT ABDOMEN AND PELVIS WITH CONTRAST TECHNIQUE: Multidetector CT imaging of the abdomen and pelvis was performed using the standard protocol following bolus administration of intravenous contrast. RADIATION DOSE REDUCTION: This exam was performed according to the departmental dose-optimization program which includes automated exposure control, adjustment of the mA and/or kV according to patient size and/or use of iterative reconstruction technique. CONTRAST:  OMNIPAQUE IOHEXOL 300 MG/ML  SOLN COMPARISON:  CT 10/29/2022 and older FINDINGS: Lower chest: Lung bases are clear.  No pleural effusion. Hepatobiliary: Diffuse fatty liver infiltration. No space-occupying liver lesion. Patent portal vein. Gallbladder is nondilated. Pancreas: Multiple calcifications seen along the head and uncinate process of the pancreas with some ductal dilatation and adjacent  inflammatory stranding. Please correlate for pancreatitis, possibly acute on chronic. Slight ectasia of the pancreatic duct up to 2 mm. No well-defined fluid collections. Spleen: Normal in size without focal abnormality. Adrenals/Urinary Tract: Adrenal glands are preserved. No enhancing renal mass or collecting system dilatation. Small Bosniak 1 cysts seen exophytic from the lateral aspect of the left mid kidney measuring 10 mm. No specific imaging follow-up. Preserved contours of the urinary bladder. Stomach/Bowel: On this non oral contrast exam, the large bowel is of normal course and caliber. The stomach and small bowel are nondilated. Vascular/Lymphatic: Diffuse vascular calcifications along the aorta and iliac vessels. Normal caliber IVC. No specific abnormal lymph node enlargement identified in the abdomen and pelvis. Reproductive: Status post hysterectomy. No adnexal masses. Other: No free air identified. Musculoskeletal: Mild degenerative changes of the spine and pelvis. Streak artifact related to the pins along the left femoral neck and head. There is some heterogeneity to the femoral head with some lucencies and sclerosis. Please correlate for components of AVN. Stable sclerotic lesion along the left iliac bone. IMPRESSION: Developing stranding and fluid surrounding the pancreatic head and neck region consistent with acute component of pancreatitis. There is some underlying calcification and irregularity consistent with previous, chronic calcific pancreatitis. No well-defined fluid collection. Preserved pancreatic enhancement. Fatty liver infiltration. Left-sided hip pins identified. There is some sclerosis and lucency and irregularity along the left femoral head which is new from the previous examination. Please correlate for any developing AVN. Electronically Signed   By: Karen Kays M.D.   On: 03/10/2023 12:32    EKG: Independently reviewed.  Showed sinus rhythm with QTc of 510, without significant  ischemic chagnes.  Assessment/Plan Active Problems:   Acute on chronic pancreatitis (HCC)   Acute on chronic pancreatitis suspect from etoh or recent gastroenteritis.  No new medicines to speak of.  No mention of gallstones on ct scan.   No reason to think infection at this time.  Will hold abx Pain control, tylenol prn, oxy for mod and severe and morphine for breakthrough IV fluids. IV LR to the am Encouraged complete etoh cessation - advance  diet as able  Lactic acidosis, suspect from poor po intake, no signs of infection, repeating til normal.  Already improving.  Etoh use - ciwa protocol, cont vitamins, doubt will see withdrawals since states hasn't been drinkin recently.  Last of which was 1 week ago. Tobacco abuse and thc uce.  Wants nicotine patch  Borderline prolonged qtc, 510, repeat EKG in am , careful with antiemetics and qt prolongers. But feel okay.  Suspect will improve w k improvement.  Hypokalemia - another after ED's .  Repeat k in the am. Eat as able.  GERD - protonix, pepcid ?Moderate protein calorie malnutrition: BMI only 18.    Grieving- toc consult, consider grieving counseling Metabolic anion gap acidosis HLD-rosuvastatin copd - cont equivalent inhalers, but holding mometasone since only takes prn at home, encouraged smoking cessation, encouraged 1800 quit now who might send her nicotine patches. Cad, History of carotid enterectomy  clopidogrel,   Patient and/or Family completely agreed with the plan, expressed understanding and I answered all questions.  DVT prophylaxis: Lovenox SQ Code Status: Full code Family Communication: na Disposition Plan: likely home when tolerating po  Consults called: na Admission status: observation for now    A total of 82 minutes utilized during this admission.  Charlane Ferretti DO [facility]   If 7PM-7AM, please contact night-coverage [email or web address] Password [facility]  03/10/2023, 7:27 PM

## 2023-03-10 NOTE — ED Notes (Signed)
ED TO INPATIENT HANDOFF REPORT  ED Nurse Name and Phone #: Maple Hudson Name/Age/Gender Felicia Perkins 47 y.o. female Room/Bed: WA09/WA09  Code Status   Code Status: Full Code  Home/SNF/Other Home Patient oriented to: self, place, time, and situation Is this baseline? Yes   Triage Complete: Triage complete  Chief Complaint Acute on chronic pancreatitis (HCC) [K85.90, K86.1]  Triage Note Pt arrived via POV. C/o central abd pain, and N/V since this AM. Pt had diarrhea for 1x week, which has now resolved. Pt states this feels like an episode of pancreatitis.   AOx4   Allergies No Known Allergies  Level of Care/Admitting Diagnosis ED Disposition     ED Disposition  Admit   Condition  --   Comment  Hospital Area: [facility] [[id number]]  Level of Care: Telemetry [5]  Admit to tele based on following criteria: Other see comments  Comments: hypokalemia  May place patient in observation at [facility] or [facility] if equivalent level of care is available:: Yes  Covid Evaluation: Symptomatic Person Under Investigation (PUI) or recent exposure (last 10 days) *Testing Required*  Diagnosis: Acute on chronic pancreatitis [facility]) [[id number]]  Admitting Physician: Charlane Ferretti [[id number]]  Attending Physician: Charlane Ferretti [[id number]]          B Medical/Surgery History Past Medical History:  Diagnosis Date   Acute on chronic pancreatitis (HCC) 03/22/2019   Acute pancreatitis    Alcohol abuse    Alcohol dependence (HCC) 12/16/2012   Medical detox successful    Alcohol induced acute pancreatitis 11/22/2017   Alcoholic cirrhosis of liver without ascites (HCC) 10/15/2016   Anxiety    Aortic atherosclerosis (HCC)    Bipolar 1 disorder (HCC)    Cancer (HCC)    cervical cancer   Chest pain with moderate risk for cardiac etiology 12/15/2014   Cirrhosis (HCC)    COPD (chronic obstructive pulmonary disease) (HCC) 09/04/2016   Coronary artery  disease    Depression    Duodenitis 11/22/2017   Eczema 10/09/2018   Elevated liver enzymes 12/22/2014   Fracture of femoral neck, left (HCC) 06/04/2022   Gastritis and gastroduodenitis 09/04/2016   GERD (gastroesophageal reflux disease)    Hip fracture (HCC) 06/03/2022   Nondisplaced fracture of neck of fifth metacarpal bone, right hand, initial encounter for closed fracture 08/26/2017   Pancreatitis    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   PTSD (post-traumatic stress disorder)    Tobacco abuse 09/04/2016   Tobacco abuse counseling    Vascular disease    Past Surgical History:  Procedure Laterality Date   ABDOMINAL HYSTERECTOMY  2004   BIOPSY  01/02/2018   Procedure: BIOPSY;  Surgeon: [name], MD;  Location: [facility];  Service: Endoscopy;;   COLONOSCOPY     Done in [place] she thinks. Early 20's. Normal   ENDARTERECTOMY Right 01/26/2021   Procedure: RIGHT CAROTID ARTERY ENDARTERECTOMY;  Surgeon: Maeola Harman, MD;  Location: [facility] OR;  Service: Vascular;  Laterality: Right;   ESOPHAGOGASTRODUODENOSCOPY (EGD) WITH PROPOFOL N/A 01/02/2018   Procedure: ESOPHAGOGASTRODUODENOSCOPY (EGD) WITH PROPOFOL;  Surgeon: [name], MD;  Location: [facility];  Service: Endoscopy;  Laterality: N/A;   EUS N/A 01/02/2018   Procedure: UPPER ENDOSCOPIC ULTRASOUND (EUS) RADIAL;  Surgeon: [name], MD;  Location: [facility];  Service: Endoscopy;  Laterality: N/A;   FOOT SURGERY     HIP PINNING,CANNULATED Left 06/05/2022   Procedure: PERCUTANEOUS FIXATION OF FEMORAL NECK;  Surgeon: Durene Romans,  MD;  Location: [facility] OR;  Service: Orthopedics;  Laterality: Left;   OTHER SURGICAL HISTORY     ovarian surgery   TUBAL LIGATION       A IV Location/Drains/Wounds Patient Lines/Drains/Airways Status     Active Line/Drains/Airways     Name Placement date Placement time Site Days   Peripheral IV 03/10/23 22 G 1" Right Antecubital 03/10/23  1526  Antecubital  less than 1             Intake/Output Last 24 hours No intake or output data in the 24 hours ending 03/10/23 1918  Labs/Imaging Results for orders placed or performed during the hospital encounter of 03/10/23 (from the past 48 hour(s))  Urinalysis, Routine w reflex microscopic -Urine, Clean Catch     Status: Abnormal   Collection Time: 03/10/23 11:00 AM  Result Value Ref Range   Color, Urine YELLOW YELLOW   APPearance CLOUDY (A) CLEAR   Specific Gravity, Urine 1.014 1.005 - 1.030   pH 8.0 5.0 - 8.0   Glucose, UA NEGATIVE NEGATIVE mg/dL   Hgb urine dipstick NEGATIVE NEGATIVE   Bilirubin Urine NEGATIVE NEGATIVE   Ketones, ur NEGATIVE NEGATIVE mg/dL   Protein, ur NEGATIVE NEGATIVE mg/dL   Nitrite NEGATIVE NEGATIVE   Leukocytes,Ua NEGATIVE NEGATIVE    Comment: Performed at [facility], [address], [place], [place] [address]  Lipase, blood     Status: Abnormal   Collection Time: 03/10/23 11:30 AM  Result Value Ref Range   Lipase 144 (H) 11 - 51 U/L    Comment: Performed at [facility], [address], [place], [place] [address]  Comprehensive metabolic panel     Status: Abnormal   Collection Time: 03/10/23 11:30 AM  Result Value Ref Range   Sodium 139 135 - 145 mmol/L   Potassium 3.4 (L) 3.5 - 5.1 mmol/L   Chloride 99 98 - 111 mmol/L   CO2 21 (L) 22 - 32 mmol/L   Glucose, Bld 120 (H) 70 - 99 mg/dL    Comment: Glucose reference range applies only to samples taken after fasting for at least 8 hours.   BUN 7 6 - 20 mg/dL   Creatinine, Ser <7.82 (L) 0.44 - 1.00 mg/dL   Calcium 9.3 8.9 - 95.6 mg/dL   Total Protein 7.7 6.5 - 8.1 g/dL   Albumin 4.3 3.5 - 5.0 g/dL   AST 25 15 - 41 U/L   ALT 14 0 - 44 U/L   Alkaline Phosphatase 86 38 - 126 U/L   Total Bilirubin 0.7 0.3 - 1.2 mg/dL   GFR, Estimated NOT CALCULATED >60 mL/min    Comment: (NOTE) Calculated using the CKD-EPI Creatinine Equation (2021)    Anion gap 19 (H) 5 - 15    Comment: Performed  at [facility], [address], [place], [place] [address]  CBC     Status: None   Collection Time: 03/10/23 11:30 AM  Result Value Ref Range   WBC 8.7 4.0 - 10.5 K/uL   RBC 4.41 3.87 - 5.11 MIL/uL   Hemoglobin 15.0 12.0 - 15.0 g/dL   HCT 65.7 84.6 - 96.2 %   MCV 98.6 80.0 - 100.0 fL   MCH 34.0 26.0 - 34.0 pg   MCHC 34.5 30.0 - 36.0 g/dL   RDW 95.2 84.1 - 32.4 %   Platelets 339 150 - 400 K/uL   nRBC 0.0 0.0 - 0.2 %    Comment: Performed at  [facility], [address], [place], [place] [address]  I-Stat Lactic Acid     Status: Abnormal   Collection Time: 03/10/23  3:29 PM  Result Value Ref Range   Lactic Acid, Venous 3.9 (HH) 0.5 - 1.9 mmol/L   Comment NOTIFIED PHYSICIAN   I-Stat Lactic Acid     Status: Abnormal   Collection Time: 03/10/23  6:10 PM  Result Value Ref Range   Lactic Acid, Venous 2.7 (HH) 0.5 - 1.9 mmol/L   Comment NOTIFIED PHYSICIAN    CT ABDOMEN PELVIS W CONTRAST  Result Date: 03/10/2023 CLINICAL DATA:  Pancreatitis. EXAM: CT ABDOMEN AND PELVIS WITH CONTRAST TECHNIQUE: Multidetector CT imaging of the abdomen and pelvis was performed using the standard protocol following bolus administration of intravenous contrast. RADIATION DOSE REDUCTION: This exam was performed according to the departmental dose-optimization program which includes automated exposure control, adjustment of the mA and/or kV according to patient size and/or use of iterative reconstruction technique. CONTRAST:  OMNIPAQUE IOHEXOL 300 MG/ML  SOLN COMPARISON:  CT 10/29/2022 and older FINDINGS: Lower chest: Lung bases are clear.  No pleural effusion. Hepatobiliary: Diffuse fatty liver infiltration. No space-occupying liver lesion. Patent portal vein. Gallbladder is nondilated. Pancreas: Multiple calcifications seen along the head and uncinate process of the pancreas with some ductal dilatation and adjacent inflammatory stranding. Please correlate for pancreatitis,  possibly acute on chronic. Slight ectasia of the pancreatic duct up to 2 mm. No well-defined fluid collections. Spleen: Normal in size without focal abnormality. Adrenals/Urinary Tract: Adrenal glands are preserved. No enhancing renal mass or collecting system dilatation. Small Bosniak 1 cysts seen exophytic from the lateral aspect of the left mid kidney measuring 10 mm. No specific imaging follow-up. Preserved contours of the urinary bladder. Stomach/Bowel: On this non oral contrast exam, the large bowel is of normal course and caliber. The stomach and small bowel are nondilated. Vascular/Lymphatic: Diffuse vascular calcifications along the aorta and iliac vessels. Normal caliber IVC. No specific abnormal lymph node enlargement identified in the abdomen and pelvis. Reproductive: Status post hysterectomy. No adnexal masses. Other: No free air identified. Musculoskeletal: Mild degenerative changes of the spine and pelvis. Streak artifact related to the pins along the left femoral neck and head. There is some heterogeneity to the femoral head with some lucencies and sclerosis. Please correlate for components of AVN. Stable sclerotic lesion along the left iliac bone. IMPRESSION: Developing stranding and fluid surrounding the pancreatic head and neck region consistent with acute component of pancreatitis. There is some underlying calcification and irregularity consistent with previous, chronic calcific pancreatitis. No well-defined fluid collection. Preserved pancreatic enhancement. Fatty liver infiltration. Left-sided hip pins identified. There is some sclerosis and lucency and irregularity along the left femoral head which is new from the previous examination. Please correlate for any developing AVN. Electronically Signed   By: Karen Kays M.D.   On: 03/10/2023 12:32    Pending Labs [employer] (From admission, onward)     Start     Ordered   Signed and Held  SARS Coronavirus 2 by RT PCR (hospital order,  performed in [facility] hospital lab) *cepheid single result test* Anterior Nasal Swab  (Tier 2 - SARS Coronavirus 2 by RT PCR (hospital order, performed in [facility] hospital lab) *cepheid single result test*)  Once,   R        Signed and Held   Signed and Held  Protime-INR  Once,   R  Signed and Held   Signed and Held  Comprehensive metabolic panel  Tomorrow morning,   R        Signed and Held   Signed and Held  CBC  Tomorrow morning,   R        Signed and Held            Vitals/Pain Today's Vitals   03/10/23 1546 03/10/23 1630 03/10/23 1735 03/10/23 1846  BP: (!) 160/91 (!) 161/101  (!) 164/98  Pulse: 70 81  91  Resp: 15 16  16   Temp: 98.4 F (36.9 C)     TempSrc: Oral     SpO2: 100% 100%  100%  Weight:      Height:      PainSc:   5      Isolation Precautions No active isolations  Medications Medications  lactated ringers bolus 1,000 mL (has no administration in time range)  potassium chloride SA (KLOR-CON M) CR tablet 40 mEq (has no administration in time range)  nicotine (NICODERM CQ - dosed in mg/24 hours) patch 14 mg (has no administration in time range)  ondansetron (ZOFRAN) injection 4 mg (4 mg Intravenous Given 03/10/23 1134)  morphine (PF) 4 MG/ML injection 4 mg (4 mg Intravenous Given 03/10/23 1134)  iohexol (OMNIPAQUE) 300 MG/ML solution 100 mL (100 mLs Intravenous Contrast Given 03/10/23 1213)  ondansetron (ZOFRAN) injection 4 mg (4 mg Intravenous Given 03/10/23 1343)  potassium chloride SA (KLOR-CON M) CR tablet 20 mEq (20 mEq Oral Given 03/10/23 1348)  lactated ringers bolus 1,000 mL (1,000 mLs Intravenous New Bag/Given 03/10/23 1601)  morphine (PF) 4 MG/ML injection 4 mg (4 mg Intravenous Given 03/10/23 1655)    Mobility walks     Focused Assessments Patient came in for abdominal pain with N/V with diarrhea. Patient was going to be discharged but her lactic came back elevated so decided to keep her.    R Recommendations: See Admitting  Provider Note  Report given to:

## 2023-03-10 NOTE — ED Notes (Signed)
Lab draw unsuccessful 

## 2023-03-10 NOTE — Discharge Instructions (Addendum)
Felicia Perkins,  You are in the hospital because of acute pancreatitis.  This was managed with bowel rest and IV fluids in addition to pain medication.  You have improved with bowel rest.  Please refrain from drinking alcohol as this will decrease the chance that you will have recurrent episodes.  Please follow-up with your primary care physician for hospital follow-up. Please follow-up with your orthopedic surgeon regarding your hip.   Pancreatitis Nutrition Therapy  The pancreas helps your body digest and absorb nutrients in food. Pancreatitis prevents the body from digesting food well, especially if the food is high in fat.  This nutrition therapy limits the fat in your diet while providing nutrients you need. Your goal is to eat as near to a normal diet as possible without experiencing gastrointestinal (GI) symptoms. These symptoms include stomach pain, bloating, weight loss or difficulty maintaining weight, vomiting, burping, loose stools, and steatorrhea. The effects of pancreatitis are different for all individuals, so it is important to work with your registered dietitian nutritionist (RDN) to determine which foods trigger your GI symptoms. Your RDN can also help you figure out your tolerance to fat in foods and how to manage your symptoms with your diet.  Tips You may need to take pancreatic enzymes if you have frequent, loose stools after mealtimes. Individuals who take pancreatic enzymes will need to take the prescribed dosage at the start of a meal or snack. Individuals who are prescribed a low-fat diet will need to limit fats and oils to no more than 6 teaspoons (30 grams) daily. Up to 1 ounce of avocado can also be substituted for 1 teaspoon of fat. A low-fat diet may not be needed if you are taking pancreatic enzymes. Avoid drinking alcohol. Keep a bottle of water with you at all times to stay hydrated and ensure you are getting in enough fluid each day. The general recommendation is  to drink 8 cups (64 ounces) of fluids daily. Eat small, frequent meals (4-6) throughout the day to help you recover from pancreatitis or to maintain your normal body weight. Eat more whole fruits and vegetables rather than drinking fruit and vegetable juices.  Fiber is found in whole grain foods and slows digestion. You may need to choose whole grain foods less often if you feel full quickly after eating. Ask your RDN for recommendations on managing your diet if you also have other conditions, such as diabetes mellitus. Your RDN might recommend a vitamin and mineral supplement or fat-soluble vitamin supplements if you require higher amounts of these nutrients.  Foods Recommended and Foods Not Recommended The following list of foods may be helpful if you need to limit your fat intake. Food Group Foods Recommended Foods Not Recommended  Grains Breads: Bagels, buns, English muffins Hot/cold cereals Couscous Low-fat crackers Pancakes Pasta Popcorn, air popped Rice Corn or flour tortilla Products made with added fat (such as biscuits, waffles, and regular crackers) High-fat bakery products such as doughnuts, biscuits, croissants, Danish pastries, pies, cookies Snacks made with partially hydrogenated oils including chips, cheese puffs, snack mixes, regular crackers, butter-flavored popcorn  Protein Foods Lean cuts of poultry (without skin) such as chicken or [place] Low-fat hamburger (for example, 7% fat) Lean cuts of fish (white fish) Canned tuna in water Egg whites or egg substitute Lean deli meats such as [place], chicken, lean beef Non-animal protein sources (tofu, legumes, beans, lentils) Smooth nut butters     Higher-fat cuts of meats such as ribs, T-bone steak, regular hamburger (  15% to 20% fat) Full-fat processed meats (hot dogs, bologna, salami, sausage, bacon, etc) Red meats Organ meats (liver, brains, sweetbreads) Poultry with skin Fried meat, poultry, tofu, and fish Whole eggs  and egg yolks Full-fat refried beans Tree nuts and peanuts  Dairy and Dairy Alternatives 1% or fat-free dairy (milk, yogurt, cheese, cottage cheese, sour cream) Frozen yogurt Fortified non-dairy milk (almond, rice, soy, etc.) Creamy/cheesy sauces Cream Whole-fat or reduced-fat (2%) dairy (milk, yogurt, ice cream, cheese) Milkshakes Half-and-half Cream cheese Sour cream Coconut milk  Vegetables All fresh, frozen, or canned vegetables Fried or stir-fried vegetables Vegetables prepared with butter, cheese, or cream sauce  Fruit All fresh, frozen, or canned fruit Fried fruits Fruit served with butter or cream  Fats and Oils All vegetable oils   Butter, stick margarine, shortening, partially hydrogenated oils, tropical oils (coconut, palm, and palm kernel oils)   Pancreatitis Sample 1-Day Menu View Nutrient Info Breakfast 2 egg whites, cooked 1 whole wheat bagel 1 tablespoon low-fat cream cheese 1 cup fat-free milk  cup blueberries  Lunch 2 slices bread 2 ounces [place] breast 2 leaves lettuce 2 slices tomato 1 teaspoon mustard 2 teaspoons nonfat mayonnaise 1 cup carrots  cup pineapple 1 cup fat-free milk  Evening Meal 3 ounces tilapia, baked  cup cooked rice  cup zucchini 1 cup lettuce for salad 2 teaspoons fat-free [place] dressing, for salad 1 dinner roll 1 teaspoon margarine  Evening Snack  cup low-fat yogurt 1 cup strawberries 1 ounce pretzels  Daily Sum Nutrient Unit Value  Macronutrients  Energy kcal 1560  Energy kJ 6541  Protein g 103  Total lipid (fat) g 23  Carbohydrate, by difference g 241  Fiber, total dietary g 23  Sugars, total g 104  Minerals  Calcium, Ca mg 1159  Iron, Fe mg 12  Sodium, Na mg 2034  Vitamins  Vitamin C, total ascorbic acid mg 158  Vitamin A, IU IU 28445  Vitamin D IU 457  Lipids  Fatty acids, total saturated g 6  Fatty acids, total monounsaturated g 6  Fatty acids, total polyunsaturated g 8  Cholesterol mg 123

## 2023-03-10 NOTE — ED Provider Notes (Signed)
[place] EMERGENCY DEPARTMENT AT [facility] Provider Note   CSN: [id number] Arrival date & time: 03/10/23  1027     History  Chief Complaint  Patient presents with   Abdominal Pain   Nausea   Emesis    Felicia Perkins is a 47 y.o. female cirrhosis, pancreatitis, CAD, hypertension, COPD presented with nausea vomiting that began a week ago.  Patient states that she has history of pancreatitis and that her symptoms are similar.  Patient Nuys any hematemesis but states she has not been able to eat over the past 2 days due to her symptoms.  Patient states that she has epigastric pain that does not radiate.  Patient denied chest pain, change in sensation or swelling skills, history of AAA, dysuria, change in bowel movement  Home Medications Prior to Admission medications   Medication Sig Start Date End Date Taking? Authorizing Provider  HYDROcodone-acetaminophen (NORCO) 5-325 MG tablet Take 1 tablet by mouth every 6 (six) hours as needed for up to 3 days for moderate pain. 03/10/23 03/13/23 Yes Rella Egelston, Beverly Gust, PA-C  ondansetron (ZOFRAN) 4 MG tablet Take 1 tablet (4 mg total) by mouth every 6 (six) hours. 03/10/23  Yes Itzabella Sorrels, Beverly Gust, PA-C  albuterol (VENTOLIN HFA) 108 (90 Base) MCG/ACT inhaler Inhale 1-2 puffs into the lungs every 6 (six) hours as needed for wheezing or shortness of breath. 12/04/22   Hoy Register, MD  alum & mag hydroxide-simeth (MAALOX PLUS) 400-400-40 MG/5ML suspension Take 15 mLs by mouth every 6 (six) hours as needed for indigestion.    [provider]  clopidogrel (PLAVIX) 75 MG tablet TAKE 1 TABLET BY MOUTH EVERY DAY 12/31/22   Chuck Hint, MD  famotidine (PEPCID) 20 MG tablet Take 1 tablet (20 mg total) by mouth 2 (two) times daily. 06/07/22   Hughie Closs, MD  mometasone-formoterol (DULERA) 200-5 MCG/ACT AERO Inhale 2 puffs into the lungs daily as needed. 12/04/22   Hoy Register, MD  OVER THE COUNTER MEDICATION Take 1 capsule  by mouth daily. [employer], Historical, MD  pantoprazole (PROTONIX) 40 MG tablet Take 1 tablet (40 mg total) by mouth daily. 11/01/22   Rai, Ripudeep K, MD  polyethylene glycol (MIRALAX / GLYCOLAX) 17 g packet Take 17 g by mouth daily. Patient taking differently: Take 17 g by mouth daily as needed for moderate constipation. 06/07/22   Cassandria Anger, PA-C  rosuvastatin (CRESTOR) 10 MG tablet TAKE 1 TABLET BY MOUTH EVERY DAY 12/31/22   Chuck Hint, MD  cloNIDine (CATAPRES) 0.1 MG tablet Take 1 tablet (0.1 mg total) by mouth at bedtime. For hot flashes Patient not taking: Reported on 10/17/2019 01/13/19 08/15/20  Hoy Register, MD      Allergies    Patient has no known allergies.    Review of Systems   Review of Systems  Gastrointestinal:  Positive for abdominal pain and vomiting.    Physical Exam Updated Vital Signs BP (!) 161/101   Pulse 81   Temp 98.4 F (36.9 C) (Oral)   Resp 16   Ht 5\' 4"  (1.626 m)   Wt 50.3 kg   SpO2 100%   BMI 19.05 kg/m  Physical Exam Vitals reviewed.  Constitutional:      General: She is in acute distress.     Comments: Actively nauseous  HENT:     Head: Normocephalic and atraumatic.  Eyes:     Extraocular Movements: Extraocular movements intact.  Conjunctiva/sclera: Conjunctivae normal.     Pupils: Pupils are equal, round, and reactive to light.  Cardiovascular:     Rate and Rhythm: Normal rate and regular rhythm.     Pulses: Normal pulses.     Heart sounds: Normal heart sounds.     Comments: 2+ bilateral radial/dorsalis pedis pulses with regular rate Pulmonary:     Effort: Pulmonary effort is normal. No respiratory distress.     Breath sounds: Normal breath sounds.  Abdominal:     Palpations: Abdomen is soft.     Tenderness: There is generalized abdominal tenderness. There is guarding. There is no rebound.  Musculoskeletal:        General: Normal range of motion.     Cervical back: Normal range of motion and neck  supple.     Comments: 5 out of 5 bilateral grip/leg extension strength  Skin:    General: Skin is warm and dry.     Capillary Refill: Capillary refill takes less than 2 seconds.  Neurological:     General: No focal deficit present.     Mental Status: She is alert and oriented to person, place, and time.     Comments: Sensation intact in all 4 limbs  Psychiatric:        Mood and Affect: Mood normal.     ED Results / Procedures / Treatments   Labs (all labs ordered are listed, but only abnormal results are displayed) Labs Reviewed  LIPASE, BLOOD - Abnormal; Notable for the following components:      Result Value   Lipase 144 (*)    All other components within normal limits  COMPREHENSIVE METABOLIC PANEL - Abnormal; Notable for the following components:   Potassium 3.4 (*)    CO2 21 (*)    Glucose, Bld 120 (*)    Creatinine, Ser <0.30 (*)    Anion gap 19 (*)    All other components within normal limits  URINALYSIS, ROUTINE W REFLEX MICROSCOPIC - Abnormal; Notable for the following components:   APPearance CLOUDY (*)    All other components within normal limits  I-STAT CG4 LACTIC ACID, ED - Abnormal; Notable for the following components:   Lactic Acid, Venous 3.9 (*)    All other components within normal limits  I-STAT CG4 LACTIC ACID, ED - Abnormal; Notable for the following components:   Lactic Acid, Venous 2.7 (*)    All other components within normal limits  CBC    EKG EKG Interpretation Date/Time:  Sunday March 10 2023 11:43:08 EDT Ventricular Rate:  77 PR Interval:  122 QRS Duration:  89 QT Interval:  450 QTC Calculation: 510 R Axis:   -16  Text Interpretation: Sinus rhythm Borderline left axis deviation RSR' in V1 or V2, probably normal variant Borderline prolonged QT interval st depression in inferior leads and III seen on prior No significant change since last tracing Confirmed by Melene Plan [phone]) on 03/10/2023 12:16:25 PM  Radiology CT ABDOMEN PELVIS W  CONTRAST  Result Date: 03/10/2023 CLINICAL DATA:  Pancreatitis. EXAM: CT ABDOMEN AND PELVIS WITH CONTRAST TECHNIQUE: Multidetector CT imaging of the abdomen and pelvis was performed using the standard protocol following bolus administration of intravenous contrast. RADIATION DOSE REDUCTION: This exam was performed according to the departmental dose-optimization program which includes automated exposure control, adjustment of the mA and/or kV according to patient size and/or use of iterative reconstruction technique. CONTRAST:  OMNIPAQUE IOHEXOL 300 MG/ML  SOLN COMPARISON:  CT 10/29/2022 and older FINDINGS: Lower  chest: Lung bases are clear.  No pleural effusion. Hepatobiliary: Diffuse fatty liver infiltration. No space-occupying liver lesion. Patent portal vein. Gallbladder is nondilated. Pancreas: Multiple calcifications seen along the head and uncinate process of the pancreas with some ductal dilatation and adjacent inflammatory stranding. Please correlate for pancreatitis, possibly acute on chronic. Slight ectasia of the pancreatic duct up to 2 mm. No well-defined fluid collections. Spleen: Normal in size without focal abnormality. Adrenals/Urinary Tract: Adrenal glands are preserved. No enhancing renal mass or collecting system dilatation. Small Bosniak 1 cysts seen exophytic from the lateral aspect of the left mid kidney measuring 10 mm. No specific imaging follow-up. Preserved contours of the urinary bladder. Stomach/Bowel: On this non oral contrast exam, the large bowel is of normal course and caliber. The stomach and small bowel are nondilated. Vascular/Lymphatic: Diffuse vascular calcifications along the aorta and iliac vessels. Normal caliber IVC. No specific abnormal lymph node enlargement identified in the abdomen and pelvis. Reproductive: Status post hysterectomy. No adnexal masses. Other: No free air identified. Musculoskeletal: Mild degenerative changes of the spine and pelvis. Streak artifact  related to the pins along the left femoral neck and head. There is some heterogeneity to the femoral head with some lucencies and sclerosis. Please correlate for components of AVN. Stable sclerotic lesion along the left iliac bone. IMPRESSION: Developing stranding and fluid surrounding the pancreatic head and neck region consistent with acute component of pancreatitis. There is some underlying calcification and irregularity consistent with previous, chronic calcific pancreatitis. No well-defined fluid collection. Preserved pancreatic enhancement. Fatty liver infiltration. Left-sided hip pins identified. There is some sclerosis and lucency and irregularity along the left femoral head which is new from the previous examination. Please correlate for any developing AVN. Electronically Signed   By: Karen Kays M.D.   On: 03/10/2023 12:32    Procedures Procedures    Medications Ordered in ED Medications  lactated ringers bolus 1,000 mL (has no administration in time range)  ondansetron (ZOFRAN) injection 4 mg (4 mg Intravenous Given 03/10/23 1134)  morphine (PF) 4 MG/ML injection 4 mg (4 mg Intravenous Given 03/10/23 1134)  iohexol (OMNIPAQUE) 300 MG/ML solution 100 mL (100 mLs Intravenous Contrast Given 03/10/23 1213)  ondansetron (ZOFRAN) injection 4 mg (4 mg Intravenous Given 03/10/23 1343)  potassium chloride SA (KLOR-CON M) CR tablet 20 mEq (20 mEq Oral Given 03/10/23 1348)  lactated ringers bolus 1,000 mL (1,000 mLs Intravenous New Bag/Given 03/10/23 1601)  morphine (PF) 4 MG/ML injection 4 mg (4 mg Intravenous Given 03/10/23 1655)    ED Course/ Medical Decision Making/ A&P                             Medical Decision Making Amount and/or Complexity of Data Reviewed Labs: ordered. Radiology: ordered.  Risk Prescription drug management.   Felicia Perkins 47 y.o. presented today for nausea vomiting. Working DDx that I considered at this time includes, but not limited to, gastroenteritis,  colitis, small bowel obstruction, appendicitis, cholecystitis, pancreatitis, nephrolithiasis, AAA, UTI, pyelonephritis, ruptured ectopic pregnancy, PID, ovarian torsion.  R/o DDx: gastroenteritis, colitis, small bowel obstruction, appendicitis, cholecystitis, nephrolithiasis, AAA, UTI, pyelonephritis, ruptured ectopic pregnancy, PID, ovarian torsion: These are considered less likely due to history of present illness and physical exam findings.  Review of prior external notes: 01/31/2023 ED  Unique Tests and My Interpretation:  CBC with differential: Unremarkable CMP: Hypokalemia 3.4, anion gap 19 Lipase: Elevated 144 UA: Unremarkable EKG: Rate, rhythm, axis,  intervals all examined and without medically relevant abnormality. 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  Patient did have epigastric guarding noted and states that her pain is very similar to previous episodes of pancreatitis.  Patient was given Zofran and morphine and will obtain abdominal labs and CT scan to further assess.  Patient is not endorsing any hematemesis and so low suspicion of esophageal varices as patient does have history of cirrhosis.  Patient's labs came back indicative of possible pancreatitis with her elevated lipase.  Patient does have anion gap of 19 most likely from her emesis however lactic was ordered.  This was joined with her CT scan showing acute pancreatitis causing her symptoms.  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  Patient does see [facility] Dr. Charlann Boxer and has a total hip schedule early September.  I spoke to the patient patient states that she feels much better after receiving the Zofran and fluids and feels that she can drink water.  Patient states that she wants to be discharged with outpatient follow-up.  Patient will be p.o. challenge and if successful may be discharged with GI follow-up.  Patient was able to keep water down however stated that the water made her more nauseous and was similar Zofran.  Patient will be reevaluated shortly.  Patient's lactic came back elevated at 3.9.  Patient given fluids and will be reevaluated.  Repeat lactic was 2.69.  Another round of LR was ordered.  I spoke to the patient and we had a shared decision-making conversation which we agreed that admission would be reasonable at this time as patient still feels ill despite receiving multiple rounds of pain meds in conjunction with her elevated lactic.  Hospitalist consulted and patient stable for admission.  Hospitalist accepted patient for admission.  Patient stable for admission at this time.         Final Clinical Impression(s) / ED Diagnoses Final diagnoses:  Acute pancreatitis, unspecified complication status, unspecified pancreatitis type  Elevated lactic acid level    Rx / DC Orders ED Discharge Orders          Ordered    ondansetron (ZOFRAN) 4 MG tablet  Every 6 hours        03/10/23 1512    HYDROcodone-acetaminophen (NORCO) 5-325 MG tablet  Every 6 hours PRN        03/10/23 1512              Netta Corrigan, PA-C 03/10/23 1834    Melene Plan, DO 03/11/23 [phone]

## 2023-03-11 ENCOUNTER — Encounter (HOSPITAL_COMMUNITY): Payer: Self-pay | Admitting: Internal Medicine

## 2023-03-11 DIAGNOSIS — Z1152 Encounter for screening for COVID-19: Secondary | ICD-10-CM | POA: Diagnosis not present

## 2023-03-11 DIAGNOSIS — E785 Hyperlipidemia, unspecified: Secondary | ICD-10-CM | POA: Diagnosis not present

## 2023-03-11 DIAGNOSIS — K859 Acute pancreatitis without necrosis or infection, unspecified: Secondary | ICD-10-CM | POA: Diagnosis not present

## 2023-03-11 DIAGNOSIS — K852 Alcohol induced acute pancreatitis without necrosis or infection: Secondary | ICD-10-CM | POA: Diagnosis present

## 2023-03-11 DIAGNOSIS — Z8249 Family history of ischemic heart disease and other diseases of the circulatory system: Secondary | ICD-10-CM | POA: Diagnosis not present

## 2023-03-11 DIAGNOSIS — Z7951 Long term (current) use of inhaled steroids: Secondary | ICD-10-CM | POA: Diagnosis not present

## 2023-03-11 DIAGNOSIS — Z7902 Long term (current) use of antithrombotics/antiplatelets: Secondary | ICD-10-CM | POA: Diagnosis not present

## 2023-03-11 DIAGNOSIS — F319 Bipolar disorder, unspecified: Secondary | ICD-10-CM | POA: Diagnosis not present

## 2023-03-11 DIAGNOSIS — K219 Gastro-esophageal reflux disease without esophagitis: Secondary | ICD-10-CM | POA: Diagnosis not present

## 2023-03-11 DIAGNOSIS — F1020 Alcohol dependence, uncomplicated: Secondary | ICD-10-CM | POA: Diagnosis not present

## 2023-03-11 DIAGNOSIS — Z90710 Acquired absence of both cervix and uterus: Secondary | ICD-10-CM | POA: Diagnosis not present

## 2023-03-11 DIAGNOSIS — R7989 Other specified abnormal findings of blood chemistry: Secondary | ICD-10-CM

## 2023-03-11 DIAGNOSIS — Z681 Body mass index (BMI) 19 or less, adult: Secondary | ICD-10-CM | POA: Diagnosis not present

## 2023-03-11 DIAGNOSIS — F4310 Post-traumatic stress disorder, unspecified: Secondary | ICD-10-CM | POA: Diagnosis not present

## 2023-03-11 DIAGNOSIS — I10 Essential (primary) hypertension: Secondary | ICD-10-CM | POA: Diagnosis not present

## 2023-03-11 DIAGNOSIS — E43 Unspecified severe protein-calorie malnutrition: Secondary | ICD-10-CM | POA: Diagnosis not present

## 2023-03-11 DIAGNOSIS — K7030 Alcoholic cirrhosis of liver without ascites: Secondary | ICD-10-CM | POA: Diagnosis not present

## 2023-03-11 DIAGNOSIS — E872 Acidosis, unspecified: Secondary | ICD-10-CM | POA: Diagnosis not present

## 2023-03-11 DIAGNOSIS — K860 Alcohol-induced chronic pancreatitis: Secondary | ICD-10-CM | POA: Diagnosis not present

## 2023-03-11 DIAGNOSIS — K700 Alcoholic fatty liver: Secondary | ICD-10-CM | POA: Diagnosis not present

## 2023-03-11 DIAGNOSIS — J449 Chronic obstructive pulmonary disease, unspecified: Secondary | ICD-10-CM | POA: Diagnosis not present

## 2023-03-11 DIAGNOSIS — E876 Hypokalemia: Secondary | ICD-10-CM | POA: Diagnosis not present

## 2023-03-11 DIAGNOSIS — Z803 Family history of malignant neoplasm of breast: Secondary | ICD-10-CM | POA: Diagnosis not present

## 2023-03-11 DIAGNOSIS — Z8541 Personal history of malignant neoplasm of cervix uteri: Secondary | ICD-10-CM | POA: Diagnosis not present

## 2023-03-11 DIAGNOSIS — I2510 Atherosclerotic heart disease of native coronary artery without angina pectoris: Secondary | ICD-10-CM | POA: Diagnosis not present

## 2023-03-11 DIAGNOSIS — Z79899 Other long term (current) drug therapy: Secondary | ICD-10-CM | POA: Diagnosis not present

## 2023-03-11 MED ORDER — LACTATED RINGERS IV SOLN: INTRAVENOUS | Status: DC

## 2023-03-11 NOTE — Hospital Course (Addendum)
47 year old female with a history of alcoholic dependence and cirrhosis, pancreatitis d/t etoh use, CAD, HTN, COPD, anxiety, grieving from daughter who passed away back last 05/22/23 in a errant shooting.  And psoriasis, she presented to the ED with abd pain. Found to have acute pancreatitis following after drinking ETOH due to anniversary of her daughter's death one year ago.

## 2023-03-11 NOTE — Progress Notes (Signed)
  Progress Note   Patient: Felicia Perkins AOZ:[id number] DOB: [birth year] DOA: 03/10/2023     0 DOS: the patient was seen and examined on 03/11/2023   Brief hospital course: 47 year old female with a history of alcoholic dependence and cirrhosis, pancreatitis d/t etoh use, CAD, HTN, COPD, anxiety, grieving from daughter who passed away back last May 25, 2023 in a errant shooting.  And psoriasis, she presented to the ED with abd pain. Found to have acute pancreatitis following after drinking ETOH due to anniversary of her daughter's death one year ago.  Assessment and Plan: Acute on chronic pancreatitis secondary to ETOH abuse -.LFT's stable -Complained of 9/10 pain this AM, now improved towards afternoon and pt eager to try to advance diet -Will try clears at next meal -recheck cmp in AM   Lactic acidosis, suspect from poor po intake, no signs of infection, repeating til normal.   -Improved   Etoh use - ciwa protocol, cont vitamins, -Cessation done at bedside  Tobacco abuse  -cont with nicotine patch   Borderline prolonged qtc, 510 -Will repeat EKG -Cont to follow lytes and correct as needed   Hypokalemia  -Corrected   GERD - protonix, pepcid  Moderate protein calorie malnutrition: BMI only 18.     Grieving- toc consult, consider grieving counseling  Metabolic anion gap acidosis  HLD -continue with rosuvastatin  copd - cont equivalent inhalers, but holding mometasone since only takes prn at home, encouraged smoking cessation, encouraged 1800 quit now who might send her nicotine patches.  Cad, History of carotid enterectomy  -Seems stable at this time -Continue clopidogrel   Subjective: Still having abd discomfort. Eager to advance diet soon  Physical Exam: Vitals:   03/11/23 0430 03/11/23 0815 03/11/23 1115 03/11/23 1154  BP: 121/84 132/86 (!) 144/94 (!) 178/105  Pulse: (!) 59 62 69 62  Resp: 16 16  16   Temp: 97.7 F (36.5 C) 98.2 F (36.8 C)  97.8 F (36.6 C)   TempSrc: Oral Oral  Oral  SpO2: 98% 98%  98%  Weight:      Height:       General exam: Awake, laying in bed, in nad Respiratory system: Normal respiratory effort, no wheezing Cardiovascular system: regular rate, s1, s2 Gastrointestinal system: Soft, nondistended, positive BS Central nervous system: CN2-12 grossly intact, strength intact Extremities: Perfused, no clubbing Skin: Normal skin turgor, no notable skin lesions seen Psychiatry: Mood normal // no visual hallucinations   Data Reviewed:  Labs reviewed: Na 138, k 4.3, Cr 0.55, WBC 7.8, Hgb 13.2   Family Communication: Pt in room, family not at bedside  Disposition: Status is: Observation The patient will require care spanning > 2 midnights and should be moved to inpatient because: Severity of illness  Planned Discharge Destination: Home     Author: Rickey Barbara, MD 03/11/2023 4:06 PM  For on call review [email or web address].

## 2023-03-12 ENCOUNTER — Encounter (HOSPITAL_COMMUNITY): Payer: Self-pay | Admitting: Internal Medicine

## 2023-03-12 DIAGNOSIS — R7989 Other specified abnormal findings of blood chemistry: Secondary | ICD-10-CM | POA: Diagnosis not present

## 2023-03-12 DIAGNOSIS — K859 Acute pancreatitis without necrosis or infection, unspecified: Secondary | ICD-10-CM | POA: Diagnosis not present

## 2023-03-12 NOTE — Telephone Encounter (Signed)
Our office has tried x 3 to reach the pt to call back to schedule an appt IN OFFICE FOR PRE OP CLEARANCE. I will update the requesting office. Will remove from the pre op call back pool.

## 2023-03-12 NOTE — Progress Notes (Signed)
  Progress Note   Patient: Felicia Perkins GNF:[id number] DOB: 1975-08-30 DOA: 03/10/2023     1 DOS: the patient was seen and examined on 03/12/2023   Brief hospital course: 47 year old female with a history of alcoholic dependence and cirrhosis, pancreatitis d/t etoh use, CAD, HTN, COPD, anxiety, grieving from daughter who passed away back last 05-29-23 in a errant shooting.  And psoriasis, she presented to the ED with abd pain. Found to have acute pancreatitis following after drinking ETOH due to anniversary of her daughter's death one year ago.  Assessment and Plan: Acute on chronic pancreatitis secondary to ETOH abuse -.LFT's stable -Still complaining of significant abd pain, does not feel ready to advance diet yet -cont clears for now, plan to advance as tolerated -recheck cmp in AM   Lactic acidosis, suspect from poor po intake, no signs of infection, repeating til normal.   -normalized   Etoh use - ciwa protocol, cont vitamins, -Cessation done at bedside  Tobacco abuse  -cont with nicotine patch   Borderline prolonged qtc, 510 -Will repeat EKG -Cont to follow lytes and correct as needed   Hypokalemia  -Corrected   GERD - protonix, pepcid  Moderate protein calorie malnutrition: BMI only 18.     Grieving- toc consult, consider grieving counseling  Metabolic anion gap acidosis  HLD -continue with rosuvastatin  copd  -On minimal O2 support -No wheezing on exam -cont nebs as needed  Cad, History of carotid enterectomy  -Seems stable at this time -Continue clopidogrel   Subjective: continues with abd pain that is improved with analgesia. Not yet ready to advance diet  Physical Exam: Vitals:   03/11/23 2059 03/11/23 2122 03/12/23 0453 03/12/23 1347  BP: (!) 164/92  (!) 145/99 (!) 163/96  Pulse: 63  68 61  Resp: 14  18 18   Temp: 98.1 F (36.7 C)  98.2 F (36.8 C) 98.6 F (37 C)  TempSrc: Oral  Oral Oral  SpO2: 99% 99% 99% 97%  Weight:      Height:        General exam: Conversant, in no acute distress Respiratory system: normal chest rise, clear, no audible wheezing Cardiovascular system: regular rhythm, s1-s2 Gastrointestinal system: Nondistended, nontender, pos BS Central nervous system: No seizures, no tremors Extremities: No cyanosis, no joint deformities Skin: No rashes, no pallor Psychiatry: Affect normal // no auditory hallucinations   Data Reviewed:  Labs reviewed: Na 136, K 4.2, Cr 0.60   Family Communication: Pt in room, family not at bedside  Disposition: Status is: Inpatient Continue inpatient stay because: Severity of illness  Planned Discharge Destination: Home     Author: Rickey Barbara, MD 03/12/2023 2:28 PM  For on call review [email or web address].

## 2023-03-13 ENCOUNTER — Other Ambulatory Visit: Payer: Self-pay | Admitting: Vascular Surgery

## 2023-03-13 DIAGNOSIS — K852 Alcohol induced acute pancreatitis without necrosis or infection: Secondary | ICD-10-CM

## 2023-03-13 DIAGNOSIS — E872 Acidosis, unspecified: Secondary | ICD-10-CM | POA: Insufficient documentation

## 2023-03-13 DIAGNOSIS — E43 Unspecified severe protein-calorie malnutrition: Secondary | ICD-10-CM | POA: Insufficient documentation

## 2023-03-13 MED ORDER — DULERA 200-5 MCG/ACT IN AERO: 2.0000 | INHALATION_SPRAY | Freq: Two times a day (BID) | RESPIRATORY_TRACT | Status: DC

## 2023-03-13 MED ORDER — B COMPLEX-C PO TABS: 1.0000 | ORAL_TABLET | Freq: Every day | ORAL | 0 refills | Status: AC

## 2023-03-13 MED ORDER — NICOTINE 14 MG/24HR TD PT24: 14.0000 mg | MEDICATED_PATCH | Freq: Every day | TRANSDERMAL | 0 refills | Status: AC

## 2023-03-13 MED ORDER — B COMPLEX-C PO TABS: 1.0000 | ORAL_TABLET | Freq: Every day | ORAL | Status: DC

## 2023-03-13 MED ORDER — ENSURE ENLIVE PO LIQD
237.0000 mL | Freq: Three times a day (TID) | ORAL | Status: DC
  Administered 2023-03-13: 237 mL via ORAL

## 2023-03-13 MED ORDER — ENSURE ENLIVE PO LIQD: 237.0000 mL | Freq: Three times a day (TID) | ORAL | 0 refills | Status: AC

## 2023-03-13 MED ORDER — ADULT MULTIVITAMIN W/MINERALS CH: 1.0000 | ORAL_TABLET | Freq: Every day | ORAL | 0 refills | Status: AC

## 2023-03-13 MED ORDER — MOMETASONE FURO-FORMOTEROL FUM 200-5 MCG/ACT IN AERO
2.0000 | INHALATION_SPRAY | Freq: Two times a day (BID) | RESPIRATORY_TRACT | Status: DC
  Administered 2023-03-13: 2 via RESPIRATORY_TRACT
  Filled 2023-03-13: qty 8.8

## 2023-03-13 NOTE — Discharge Summary (Signed)
Physician Discharge Summary   Patient: Felicia Perkins MRN: [id number] DOB: [birth year]  Admit date:     03/10/2023  Discharge date: 03/13/23  Discharge Physician: Jacquelin Hawking, MD   PCP: Hoy Register, MD   Recommendations at discharge:  PCP visit for follow-up Orthopedic surgery follow-up for CT regarding the left femoral head  Discharge Diagnoses: Principal Problem:   Alcoholic pancreatitis Active Problems:   Alcohol dependence (HCC)   COPD (chronic obstructive pulmonary disease) (HCC)   GERD (gastroesophageal reflux disease)   Acute on chronic pancreatitis (HCC)   Protein-calorie malnutrition, severe   Lactic acidosis  Resolved Problems:   * No resolved hospital problems. *  Hospital Course: 47 year old female with a history of alcoholic dependence and cirrhosis, pancreatitis d/t etoh use, CAD, HTN, COPD, anxiety, grieving from daughter who passed away back last 06-13-23 in a errant shooting.  And psoriasis, she presented to the ED with abd pain. Found to have acute pancreatitis following after drinking ETOH due to anniversary of her daughter's death one year ago.  Assessment and Plan:  Acute on chronic alcoholic pancreatitis Present on admission.  Associated lipase of 144 with CT scan evidence of evidence of likely pancreatitis.  Patient managed with bowel rest, IV fluids, analgesics with improvement of symptoms.  Patient was advanced to a soft diet tolerated prior to discharge.  Lactic acidosis Present on admission and likely related to IV pancreatitis.  Resolved with IV fluids.  No evidence of infection.  Alcohol use Patient counseled on quitting.  Patient managed with CIWA while admitted.  Tobacco abuse Patient managed with a nicotine patch.  Prolonged QTc Noted.  Patient monitored on telemetry without significant arrhythmia.  Fatty liver disease Likely related to alcohol use.  History of cirrhosis noted on chart.  Follow-up with  PCP.  Hypokalemia Supplementation given.  Resolved.  GERD Severe protein calorie malnutrition  Hyperlipidemia Continue Crestor  COPD Continue Dulera and albuterol  CAD Continue Plavix.  High anion gap metabolic acidosis Resolved.  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  Recommend follow-up with orthopedic surgery as an outpatient.   Consultants: None Procedures performed: None  Disposition: Home Diet recommendation: Low-fat/soft diet   DISCHARGE MEDICATION: Allergies as of 03/13/2023   No Known Allergies      Medication List     TAKE these medications    albuterol 108 (90 Base) MCG/ACT inhaler Commonly known as: VENTOLIN HFA Inhale 1-2 puffs into the lungs every 6 (six) hours as needed for wheezing or shortness of breath.   alum & mag hydroxide-simeth 400-400-40 MG/5ML suspension Commonly known as: MAALOX PLUS Take 15 mLs by mouth every 6 (six) hours as needed for indigestion.   amLODipine 2.5 MG tablet Commonly known as: NORVASC Take 1 tablet by mouth daily.   B-complex with vitamin C tablet Take 1 tablet by mouth daily.   clopidogrel 75 MG tablet Commonly known as: PLAVIX TAKE 1 TABLET BY MOUTH EVERY DAY   Dulera 200-5 MCG/ACT Aero Generic drug: mometasone-formoterol Inhale 2 puffs into the lungs 2 (two) times daily. What changed:  when to take this reasons to take this   famotidine 20 MG tablet Commonly known as: PEPCID Take 1 tablet (20 mg total) by mouth 2 (two) times daily. What changed: when to take this   feeding supplement Liqd Take 237 mLs by mouth 3 (three) times daily between meals.   HYDROcodone-acetaminophen 5-325 MG tablet Commonly known as: Norco Take 1 tablet by mouth every 6 (six) hours as needed  for up to 3 days for moderate pain.   hydrOXYzine 25 MG tablet Commonly known as: ATARAX Take 1 tablet by mouth daily.   meloxicam 15 MG tablet Commonly known as: MOBIC Take 15 mg by mouth daily.   multivitamin  with minerals Tabs tablet Take 1 tablet by mouth daily. Start taking on: March 14, 2023   nicotine 14 mg/24hr patch Commonly known as: NICODERM CQ - dosed in mg/24 hours Place 1 patch (14 mg total) onto the skin daily for 7 days. Start taking on: March 14, 2023   ondansetron 4 MG tablet Commonly known as: ZOFRAN Take 1 tablet (4 mg total) by mouth every 6 (six) hours. What changed:  when to take this reasons to take this   OVER THE COUNTER MEDICATION Take 1 capsule by mouth daily. Sea Moss   pantoprazole 40 MG tablet Commonly known as: PROTONIX Take 1 tablet (40 mg total) by mouth daily.   polyethylene glycol 17 g packet Commonly known as: MIRALAX / GLYCOLAX Take 17 g by mouth daily. What changed:  when to take this reasons to take this   rosuvastatin 10 MG tablet Commonly known as: CRESTOR TAKE 1 TABLET BY MOUTH EVERY DAY   tiZANidine 4 MG capsule Commonly known as: ZANAFLEX Take 4 mg by mouth 3 (three) times daily as needed for muscle spasms.        Follow-up Information     [facility]. Schedule an appointment as soon as possible for a visit .   Specialty: Gastroenterology Contact information: [address] [place] [address] [phone]        Hoy Register, MD. Schedule an appointment as soon as possible for a visit in 1 week(s).   Specialty: Family Medicine Why: For hospital follow-up Contact information: [address] [place] [address] [place] [address] [phone]                Discharge Exam: BP (!) 151/82 (BP Location: Left Arm)   Pulse 80   Temp 98.4 F (36.9 C) (Oral)   Resp 18   Ht 5\' 4"  (1.626 m)   Wt 47.8 kg   SpO2 98%   BMI 18.09 kg/m   General exam: Appears calm and comfortable Respiratory system: Clear to auscultation. Respiratory effort normal. Cardiovascular system: S1 & S2 heard, RRR. No murmurs, rubs, gallops or clicks. Gastrointestinal system: Abdomen is  nondistended, soft and mildly tender.  Normal bowel sounds heard. Central nervous system: Alert and oriented. No focal neurological deficits. Musculoskeletal: No edema. No calf tenderness Psychiatry: Judgement and insight appear normal. Mood & affect appropriate.   Condition at discharge: stable  The results of significant diagnostics from this hospitalization (including imaging, microbiology, ancillary and laboratory) are listed below for reference.   Imaging Studies: CT ABDOMEN PELVIS W CONTRAST  Result Date: 03/10/2023 CLINICAL DATA:  Pancreatitis. EXAM: CT ABDOMEN AND PELVIS WITH CONTRAST TECHNIQUE: Multidetector CT imaging of the abdomen and pelvis was performed using the standard protocol following bolus administration of intravenous contrast. RADIATION DOSE REDUCTION: This exam was performed according to the departmental dose-optimization program which includes automated exposure control, adjustment of the mA and/or kV according to patient size and/or use of iterative reconstruction technique. CONTRAST:  OMNIPAQUE IOHEXOL 300 MG/ML  SOLN COMPARISON:  CT 10/29/2022 and older FINDINGS: Lower chest: Lung bases are clear.  No pleural effusion. Hepatobiliary: Diffuse fatty liver infiltration. No space-occupying liver lesion. Patent portal vein. Gallbladder is nondilated. Pancreas: Multiple calcifications seen  along the head and uncinate process of the pancreas with some ductal dilatation and adjacent inflammatory stranding. Please correlate for pancreatitis, possibly acute on chronic. Slight ectasia of the pancreatic duct up to 2 mm. No well-defined fluid collections. Spleen: Normal in size without focal abnormality. Adrenals/Urinary Tract: Adrenal glands are preserved. No enhancing renal mass or collecting system dilatation. Small Bosniak 1 cysts seen exophytic from the lateral aspect of the left mid kidney measuring 10 mm. No specific imaging follow-up. Preserved contours of the urinary  bladder. Stomach/Bowel: On this non oral contrast exam, the large bowel is of normal course and caliber. The stomach and small bowel are nondilated. Vascular/Lymphatic: Diffuse vascular calcifications along the aorta and iliac vessels. Normal caliber IVC. No specific abnormal lymph node enlargement identified in the abdomen and pelvis. Reproductive: Status post hysterectomy. No adnexal masses. Other: No free air identified. Musculoskeletal: Mild degenerative changes of the spine and pelvis. Streak artifact related to the pins along the left femoral neck and head. There is some heterogeneity to the femoral head with some lucencies and sclerosis. Please correlate for components of AVN. Stable sclerotic lesion along the left iliac bone. IMPRESSION: Developing stranding and fluid surrounding the pancreatic head and neck region consistent with acute component of pancreatitis. There is some underlying calcification and irregularity consistent with previous, chronic calcific pancreatitis. No well-defined fluid collection. Preserved pancreatic enhancement. Fatty liver infiltration. Left-sided hip pins identified. There is some sclerosis and lucency and irregularity along the left femoral head which is new from the previous examination. Please correlate for any developing AVN. Electronically Signed   By: Karen Kays M.D.   On: 03/10/2023 12:32    Microbiology: Results for orders placed or performed during the hospital encounter of 03/10/23  SARS Coronavirus 2 by RT PCR (hospital order, performed in [facility] hospital lab) *cepheid single result test* Anterior Nasal Swab     Status: None   Collection Time: 03/10/23  9:06 PM   Specimen: Anterior Nasal Swab  Result Value Ref Range Status   SARS Coronavirus 2 by RT PCR NEGATIVE NEGATIVE Final    Comment: (NOTE) SARS-CoV-2 target nucleic acids are NOT DETECTED.  The SARS-CoV-2 RNA is generally detectable in upper and lower respiratory specimens during the acute  phase of infection. The lowest concentration of SARS-CoV-2 viral copies this assay can detect is 250 copies / mL. A negative result does not preclude SARS-CoV-2 infection and should not be used as the sole basis for treatment or other patient management decisions.  A negative result may occur with improper specimen collection / handling, submission of specimen other than nasopharyngeal swab, presence of viral mutation(s) within the areas targeted by this assay, and inadequate number of viral copies (<250 copies / mL). A negative result must be combined with clinical observations, patient history, and epidemiological information.  Fact Sheet for Patients:   RoadLapTop.co.za  Fact Sheet for Healthcare Providers: http://kim-miller.com/  This test is not yet approved or  cleared by the [place] FDA and has been authorized for detection and/or diagnosis of SARS-CoV-2 by FDA under an Emergency Use Authorization (EUA).  This EUA will remain in effect (meaning this test can be used) for the duration of the COVID-19 declaration under Section 564(b)(1) of the Act, 21 U.S.C. section 360bbb-3(b)(1), unless the authorization is terminated or revoked sooner.  Performed at [facility], [address], [place], [place] [address]     Labs: CBC: Recent Labs  Lab 03/10/23 1130 03/11/23 0658  WBC  8.7 7.8  HGB 15.0 13.2  HCT 43.5 40.1  MCV 98.6 101.0*  PLT 339 257   Basic Metabolic Panel: Recent Labs  Lab 03/10/23 1130 03/11/23 0658 03/12/23 0535 03/13/23 0608  NA 139 138 136 136  K 3.4* 4.3 4.2 4.1  CL 99 102 102 101  CO2 21* 27 26 26   GLUCOSE 120* 87 92 97  BUN 7 <5* <5* <5*  CREATININE <0.30* 0.55 0.60 0.62  CALCIUM 9.3 9.3 8.9 9.0   Liver Function Tests: Recent Labs  Lab 03/10/23 1130 03/11/23 0658 03/12/23 0535 03/13/23 0608  AST 25 25 19 23   ALT 14 14 11 7   ALKPHOS 86 80 77 68  BILITOT 0.7 0.8  1.1 1.0  PROT 7.7 6.6 6.4* 6.1*  ALBUMIN 4.3 3.7 3.6 3.3*    Discharge time spent: 35 minutes.  Signed: Jacquelin Hawking, MD [facility] 03/13/2023

## 2023-03-13 NOTE — Progress Notes (Signed)
Initial Nutrition Assessment  DOCUMENTATION CODES:   Severe malnutrition in context of chronic illness, Underweight  INTERVENTION:   -Ensure Plus High Protein po TID, each supplement provides 350 kcal and 20 grams of protein.   -Continue Multivitamin with minerals daily -B complex w/ Vitamin C daily   -Provided "Pancreatitis Nutrition Therapy" handout in AVS. Reviewed diet restrictions at bedside.  -Provided Ensure coupons  NUTRITION DIAGNOSIS:   Severe Malnutrition related to chronic illness (alcohol abuse, pancreatitis) as evidenced by severe fat depletion, severe muscle depletion.  GOAL:   Patient will meet greater than or equal to 90% of their needs  MONITOR:   PO intake, Supplement acceptance, Labs, Weight trends, I & O's  REASON FOR ASSESSMENT:   Consult Assessment of nutrition requirement/status, Diet education  ASSESSMENT:   47 year old female with a history of alcoholic dependence and cirrhosis, pancreatitis d/t etoh use, CAD, HTN, COPD, anxiety, grieving from daughter who passed away back last May 19, 2023. And psoriasis, she presented to the ED with abd pain. Found to have acute pancreatitis following after drinking ETOH.  Patient reports she has not been eating well and has been drinking more lately. States she started drinking more following her late daughter's birthday. Pt still working through her grief of her daughter's death. Pt seems determined today to make changes to help her health. Reviewed pancreatitis diet restrictions. Pt works at [employer] and tends to consume a lot of fried foods. Encouraged pt to review menus before eating at fast food restaurants. Provided handout in AVS.  PTA pt reports she was eating hot wings and Margheritas which caused a lot of pain.   Pt consumed 100% of full liquid meal this morning.   Patient reports the rising prices of Ensure has deterred her from drinking as many Ensures as she would like. Will order TID to provide  additional kcals and protein. Provided pt with coupon booklet to help with cost.  Per weight records, pt has lost 7 lbs since 4/23 (6% wt loss x 3 months, insignificant for time frame). Concerning given weight is trending down.  Medications: Pepcid, Multivitamin with minerals daily, Lactated ringers  Labs reviewed.  NUTRITION - FOCUSED PHYSICAL EXAM:  Flowsheet Row Most Recent Value  Orbital Region Severe depletion  Upper Arm Region Severe depletion  Thoracic and Lumbar Region Severe depletion  Buccal Region Severe depletion  Temple Region Mild depletion  Clavicle Bone Region Severe depletion  Clavicle and Acromion Bone Region Severe depletion  Scapular Bone Region Severe depletion  Dorsal Hand Severe depletion  Patellar Region Severe depletion  Anterior Thigh Region Severe depletion  Posterior Calf Region Severe depletion  Edema (RD Assessment) None  Hair Reviewed  Eyes Reviewed  Mouth Reviewed  Skin Reviewed  Nails Reviewed       Diet Order:   Diet Order             DIET SOFT Room service appropriate? Yes; Fluid consistency: Thin  Diet effective now                   EDUCATION NEEDS:   Education needs have been addressed  Skin:  Skin Assessment: Reviewed RN Assessment  Last BM:  7/28  Height:   Ht Readings from Last 1 Encounters:  03/10/23 5\' 4"  (1.626 m)    Weight:   Wt Readings from Last 1 Encounters:  03/11/23 47.8 kg    BMI:  Body mass index is 18.09 kg/m.  Estimated Nutritional Needs:   Kcal:  1610-9604  Protein:  80-95g  Fluid:  1.9L/day  Tilda Franco, MS, RD, LDN Inpatient Clinical Dietitian Contact information available via Amion

## 2023-03-13 NOTE — Progress Notes (Signed)
Spoke with pt at bedside about [facility] services and SA/grief resources. Grief and SA resources added AVS packet. Pt states support system at home and has plans to attend a local AA group.

## 2023-03-14 ENCOUNTER — Telehealth: Payer: Self-pay

## 2023-03-14 NOTE — Transitions of Care (Post Inpatient/ED Visit) (Signed)
   03/14/2023  Name: Felicia Perkins MRN: [id number] DOB: 1976/04/28  Today's TOC FU Call Status: Today's TOC FU Call Status:: Unsuccessful Call (1st Attempt) Unsuccessful Call (1st Attempt) Date: 03/14/23  Attempted to reach the patient regarding the most recent Inpatient/ED visit.  Follow Up Plan: Additional outreach attempts will be made to reach the patient to complete the Transitions of Care (Post Inpatient/ED visit) call.     Antionette Fairy, RN,BSN,CCM [facility] Care Management Care Management Community Coordinator Direct Phone: [phone] Toll Free: [phone] Fax: [phone]

## 2023-03-15 ENCOUNTER — Telehealth: Payer: Self-pay

## 2023-03-15 NOTE — Transitions of Care (Post Inpatient/ED Visit) (Signed)
   03/15/2023  Name: ROBEN TATSCH MRN: [id number] DOB: Sep 09, 1975  Today's TOC FU Call Status: Today's TOC FU Call Status:: Unsuccessful Call (3rd Attempt) Unsuccessful Call (3rd Attempt) Date: 03/15/23  Attempted to reach the patient regarding the most recent Inpatient/ED visit.  Follow Up Plan: No further outreach attempts will be made at this time. We have been unable to contact the patient.     Antionette Fairy, RN,BSN,CCM [facility] Care Management Care Management Community Coordinator Direct Phone: [phone] Toll Free: [phone] Fax: [phone]

## 2023-03-15 NOTE — Transitions of Care (Post Inpatient/ED Visit) (Signed)
   03/15/2023  Name: SHAKIERA EDELSON MRN: [id number] DOB: 1975-12-01  Today's TOC FU Call Status: Today's TOC FU Call Status:: Unsuccessful Call (2nd Attempt) Unsuccessful Call (2nd Attempt) Date: 03/15/23  Attempted to reach the patient regarding the most recent Inpatient/ED visit.  Follow Up Plan: Additional outreach attempts will be made to reach the patient to complete the Transitions of Care (Post Inpatient/ED visit) call.      Antionette Fairy, RN,BSN,CCM [facility] Care Management Care Management Community Coordinator Direct Phone: [phone] Toll Free: [phone] Fax: [phone]

## 2023-04-01 NOTE — Progress Notes (Signed)
Sent message, via epic in basket, requesting order in epic from surgeon  

## 2023-04-04 ENCOUNTER — Other Ambulatory Visit: Payer: Self-pay | Admitting: Medical Genetics

## 2023-04-04 DIAGNOSIS — Z006 Encounter for examination for normal comparison and control in clinical research program: Secondary | ICD-10-CM

## 2023-04-05 NOTE — Progress Notes (Addendum)
Please send preop orders for PST visit on 04/18/23.

## 2023-04-05 NOTE — Progress Notes (Signed)
COVID Vaccine received:  [x]  No []  Yes Date of any COVID positive Test in last 90 days: no PCP - Hoy Register MD Cardiologist - Kardie Tobb DO  Chest x-ray - 06/03/22 Epic EKG -03/10/23 Epic   Stress Test -  ECHO - 01/03/21 EPIC Cardiac Cath -   Bowel Prep - [x]  No  []   Yes ______  Pacemaker / ICD device [x]  No []  Yes   Spinal Cord Stimulator:[x]  No []  Yes       History of Sleep Apnea? [x]  No []  Yes   CPAP used?- [x]  No []  Yes    Does the patient monitor blood sugar?          [x]  No []  Yes  []  N/A  Patient has: [x]  NO Hx DM   []  Pre-DM                 []  DM1  []   DM2 Does patient have a [employer] or Dexacom? []  No []  Yes   Fasting Blood Sugar Ranges-  Checks Blood Sugar _____ times a day  GLP1 agonist / usual dose - no GLP1 instructions:  SGLT-2 inhibitors / usual dose - no SGLT-2 instructions:   Blood Thinner / Instructions:Plavix Will stop taking 04/09/23 per Dr's office per pt. Aspirin Instructions:  Comments:   Activity level: Patient is able to climb a flight of stairs without difficulty; [x]  No CP  [x]  No SOB, but would have ___   Patient can perform ADLs without assistance.   Anesthesia review: COPD,Bipolar, Carotid artery stenosis, CAD, HTN, smoker, cirrhosis, vascular disease  Patient denies shortness of breath, fever, cough and chest pain at PAT appointment.  Patient verbalized understanding and agreement to the Pre-Surgical Instructions that were given to them at this PAT appointment. Patient was also educated of the need to review these PAT instructions again prior to his/her surgery.I reviewed the appropriate phone numbers to call if they have any and questions or concerns.

## 2023-04-05 NOTE — Patient Instructions (Signed)
SURGICAL WAITING ROOM VISITATION  Patients having surgery or a procedure may have no more than 2 support people in the waiting area - these visitors may rotate.    Children under the age of 92 must have an adult with them who is not the patient.  Due to an increase in RSV and influenza rates and associated hospitalizations, children ages 11 and under may not visit patients in [facility] hospitals.  If the patient needs to stay at the hospital during part of their recovery, the visitor guidelines for inpatient rooms apply. Pre-op nurse will coordinate an appropriate time for 1 support person to accompany patient in pre-op.  This support person may not rotate.    Please refer to the [facility] website for the visitor guidelines for Inpatients (after your surgery is over and you are in a regular room).       Your procedure is scheduled on: 04/18/23   Report to [facility] Main Entrance    Report to admitting at  6:30 AM   Call this number if you have problems the morning of surgery [phone]   Do not eat food :After Midnight.   After Midnight you may have the following liquids until ______ AM/ PM DAY OF SURGERY  Water Non-Citrus Juices (without pulp, NO RED-Apple, White grape, White cranberry) Black Coffee (NO MILK/CREAM OR CREAMERS, sugar ok)  Clear Tea (NO MILK/CREAM OR CREAMERS, sugar ok) regular and decaf                             Plain Jell-O (NO RED)                                           Fruit ices (not with fruit pulp, NO RED)                                     Popsicles (NO RED)                                                               Sports drinks like Gatorade (NO RED)              Drink 2 Ensure/G2 drinks AT 10:00 PM the night before surgery.        The day of surgery:  Drink ONE (1) Pre-Surgery Clear Ensure or G2 at AM the morning of surgery. Drink in one sitting. Do not sip.  This drink was given to you during your hospital  pre-op  appointment visit. Nothing else to drink after completing the  Pre-Surgery Clear Ensure or G2.          If you have questions, please contact your surgeon's office.   FOLLOW BOWEL PREP AND ANY ADDITIONAL PRE OP INSTRUCTIONS YOU RECEIVED FROM YOUR SURGEON'S OFFICE!!!     Oral Hygiene is also important to reduce your risk of infection.  Remember - BRUSH YOUR TEETH THE MORNING OF SURGERY WITH YOUR REGULAR TOOTHPASTE  DENTURES WILL BE REMOVED PRIOR TO SURGERY PLEASE DO NOT APPLY "Poly grip" OR ADHESIVES!!!   Do NOT smoke after Midnight   Stop all vitamins and herbal supplements 7 days before surgery.   Take these medicines the morning of surgery with A SIP OF WATER: Hydroxyzine, pantoprazole, Rosuvastatin             You may not have any metal on your body including hair pins, jewelry, and body piercing             Do not wear make-up, lotions, powders, perfumes or deodorant  Do not wear nail polish including gel and S&S, artificial/acrylic nails, or any other type of covering on natural nails including finger and toenails. If you have artificial nails, gel coating, etc. that needs to be removed by a nail salon please have this removed prior to surgery or surgery may need to be canceled/ delayed if the surgeon/ anesthesia feels like they are unable to be safely monitored.   Do not shave  48 hours prior to surgery.    Do not bring valuables to the hospital. [facility] IS NOT             RESPONSIBLE   FOR VALUABLES.   Contacts, glasses, dentures or bridgework may not be worn into surgery.   Bring small overnight bag day of surgery.   DO NOT BRING YOUR HOME MEDICATIONS TO THE HOSPITAL. PHARMACY WILL DISPENSE MEDICATIONS LISTED ON YOUR MEDICATION LIST TO YOU DURING YOUR ADMISSION IN THE HOSPITAL!    Patients discharged on the day of surgery will not be allowed to drive home.  Someone NEEDS to stay with you for the first 24 hours after  anesthesia.   Special Instructions: Bring a copy of your healthcare power of attorney and living will documents the day of surgery if you haven't scanned them before.              Please read over the following fact sheets you were given: IF YOU HAVE QUESTIONS ABOUT YOUR PRE-OP INSTRUCTIONS PLEASE CALL [phone] Rosey Bath   If you received a COVID test during your pre-op visit  it is requested that you wear a mask when out in public, stay away from anyone that may not be feeling well and notify your surgeon if you develop symptoms. If you test positive for Covid or have been in contact with anyone that has tested positive in the last 10 days please notify you surgeon.      Pre-operative 5 CHG Bath Instructions   You can play a key role in reducing the risk of infection after surgery. Your skin needs to be as free of germs as possible. You can reduce the number of germs on your skin by washing with CHG (chlorhexidine gluconate) soap before surgery. CHG is an antiseptic soap that kills germs and continues to kill germs even after washing.   DO NOT use if you have an allergy to chlorhexidine/CHG or antibacterial soaps. If your skin becomes reddened or irritated, stop using the CHG and notify one of our RNs at [phone].   Please shower with the CHG soap starting 4 days before surgery using the following schedule:     Please keep in mind the following:  DO NOT shave, including legs and underarms, starting the day of your first shower.   You may shave your face at any point before/day of surgery.  Place clean sheets on your bed the day you start using CHG soap. Use a clean washcloth (not used since being washed) for each shower. DO NOT sleep with pets once you start using the CHG.   CHG Shower Instructions:  If you choose to wash your hair and private area, wash first with your normal shampoo/soap.  After you use shampoo/soap, rinse your hair and body thoroughly to remove shampoo/soap  residue.  Turn the water OFF and apply about 3 tablespoons (45 ml) of CHG soap to a CLEAN washcloth.  Apply CHG soap ONLY FROM YOUR NECK DOWN TO YOUR TOES (washing for 3-5 minutes)  DO NOT use CHG soap on face, private areas, open wounds, or sores.  Pay special attention to the area where your surgery is being performed.  If you are having back surgery, having someone wash your back for you may be helpful. Wait 2 minutes after CHG soap is applied, then you may rinse off the CHG soap.  Pat dry with a clean towel  Put on clean clothes/pajamas   If you choose to wear lotion, please use ONLY the CHG-compatible lotions on the back of this paper.     Additional instructions for the day of surgery: DO NOT APPLY any lotions, deodorants, cologne, or perfumes.   Put on clean/comfortable clothes.  Brush your teeth.  Ask your nurse before applying any prescription medications to the skin.      CHG Compatible Lotions   Aveeno Moisturizing lotion  Cetaphil Moisturizing Cream  Cetaphil Moisturizing Lotion  Clairol Herbal Essence Moisturizing Lotion, Dry Skin  Clairol Herbal Essence Moisturizing Lotion, Extra Dry Skin  Clairol Herbal Essence Moisturizing Lotion, Normal Skin  Curel Age Defying Therapeutic Moisturizing Lotion with Alpha Hydroxy  Curel Extreme Care Body Lotion  Curel Soothing Hands Moisturizing Hand Lotion  Curel Therapeutic Moisturizing Cream, Fragrance-Free  Curel Therapeutic Moisturizing Lotion, Fragrance-Free  Curel Therapeutic Moisturizing Lotion, Original Formula  Eucerin Daily Replenishing Lotion  Eucerin Dry Skin Therapy Plus Alpha Hydroxy Crme  Eucerin Dry Skin Therapy Plus Alpha Hydroxy Lotion  Eucerin Original Crme  Eucerin Original Lotion  Eucerin Plus Crme Eucerin Plus Lotion  Eucerin TriLipid Replenishing Lotion  Keri Anti-Bacterial Hand Lotion  Keri Deep Conditioning Original Lotion Dry Skin Formula Softly Scented  Keri Deep Conditioning Original Lotion,  Fragrance Free Sensitive Skin Formula  Keri Lotion Fast Absorbing Fragrance Free Sensitive Skin Formula  Keri Lotion Fast Absorbing Softly Scented Dry Skin Formula  Keri Original Lotion  Keri Skin Renewal Lotion Keri Silky Smooth Lotion  Keri Silky Smooth Sensitive Skin Lotion  Nivea Body Creamy Conditioning Oil  Nivea Body Extra Enriched Teacher, adult education Moisturizing Lotion Nivea Crme  Nivea Skin Firming Lotion  NutraDerm 30 Skin Lotion  NutraDerm Skin Lotion  NutraDerm Therapeutic Skin Cream  NutraDerm Therapeutic Skin Lotion  ProShield Protective Hand Cream  Provon moisturizing lotion

## 2023-04-08 ENCOUNTER — Encounter (HOSPITAL_COMMUNITY)
Admission: RE | Admit: 2023-04-08 | Discharge: 2023-04-08 | Disposition: A | Discharge status: Home / Self Care | Payer: 59 | Source: Ambulatory Visit | Attending: Orthopedic Surgery | Admitting: Orthopedic Surgery

## 2023-04-08 ENCOUNTER — Encounter (HOSPITAL_COMMUNITY): Payer: Self-pay

## 2023-04-08 ENCOUNTER — Other Ambulatory Visit: Payer: Self-pay

## 2023-04-08 VITALS — BP 134/82 | HR 76 | Temp 98.4°F | Resp 16 | Ht 64.0 in | Wt 107.0 lb

## 2023-04-08 DIAGNOSIS — Z01812 Encounter for preprocedural laboratory examination: Secondary | ICD-10-CM | POA: Insufficient documentation

## 2023-04-08 DIAGNOSIS — I10 Essential (primary) hypertension: Secondary | ICD-10-CM | POA: Diagnosis not present

## 2023-04-08 DIAGNOSIS — M87052 Idiopathic aseptic necrosis of left femur: Secondary | ICD-10-CM | POA: Diagnosis not present

## 2023-04-08 DIAGNOSIS — Z01818 Encounter for other preprocedural examination: Secondary | ICD-10-CM

## 2023-04-08 HISTORY — DX: Anemia, unspecified: D64.9

## 2023-04-08 HISTORY — DX: Peripheral vascular disease, unspecified: I73.9

## 2023-04-08 LAB — BASIC METABOLIC PANEL
Anion gap: 10 (ref 5–15)
BUN: 8 mg/dL (ref 6–20)
CO2: 25 mmol/L (ref 22–32)
Calcium: 9.3 mg/dL (ref 8.9–10.3)
Chloride: 98 mmol/L (ref 98–111)
Creatinine, Ser: 0.6 mg/dL (ref 0.44–1.00)
GFR, Estimated: >60 mL/min (ref >60)
Glucose, Bld: 104 mg/dL — ABNORMAL HIGH (ref 70–99)
Potassium: 3.8 mmol/L (ref 3.5–5.1)
Sodium: 133 mmol/L — ABNORMAL LOW (ref 135–145)

## 2023-04-08 LAB — SURGICAL PCR SCREEN
MRSA, PCR: NEGATIVE
Staphylococcus aureus: NEGATIVE

## 2023-04-08 LAB — CBC
HCT: 40.9 % (ref 36.0–46.0)
Hemoglobin: 13.9 g/dL (ref 12.0–15.0)
MCH: 33.9 pg (ref 26.0–34.0)
MCHC: 34 g/dL (ref 30.0–36.0)
MCV: 99.8 fL (ref 80.0–100.0)
Platelets: 270 10*3/uL (ref 150–400)
RBC: 4.1 MIL/uL (ref 3.87–5.11)
RDW: 13.3 % (ref 11.5–15.5)
WBC: 9 10*3/uL (ref 4.0–10.5)
nRBC: 0 % (ref 0.0–0.2)

## 2023-04-08 NOTE — Progress Notes (Unsigned)
Cardiology Clinic Note   Patient Name: Felicia Perkins Date of Encounter: 04/11/2023  Primary Care Provider:  Hoy Register, MD Primary Cardiologist:  Thomasene Ripple, DO  Patient Profile    47 y.o. female with a hx of carotid artery disease status post right carotid enterectomy which was done on January 26, 2021, smoker, bipolar disorder, liver cirrhosis. Coronary CTA was reported with no evidence of coronary artery disease, there was evidence of mild aortic atherosclerosis. Last seen by Dr. Servando Salina 03/16/2023.   Past Medical History    Past Medical History:  Diagnosis Date   Acute on chronic pancreatitis (HCC) 03/22/2019   Acute pancreatitis    Alcohol abuse    Alcohol dependence (HCC) 12/16/2012   Medical detox successful    Alcohol induced acute pancreatitis 11/22/2017   Alcoholic cirrhosis of liver without ascites (HCC) 10/15/2016   Anemia    Anxiety    Aortic atherosclerosis (HCC)    Bipolar 1 disorder (HCC)    Cancer (HCC)    cervical cancer   Chest pain with moderate risk for cardiac etiology 12/15/2014   Cirrhosis (HCC)    COPD (chronic obstructive pulmonary disease) (HCC) 09/04/2016   Coronary artery disease    Depression    Duodenitis 11/22/2017   Eczema 10/09/2018   Elevated liver enzymes 12/22/2014   Fracture of femoral neck, left (HCC) 06/04/2022   Gastritis and gastroduodenitis 09/04/2016   GERD (gastroesophageal reflux disease)    Hip fracture (HCC) 06/03/2022   Nondisplaced fracture of neck of fifth metacarpal bone, right hand, initial encounter for closed fracture 08/26/2017   Pancreatitis    Peripheral vascular disease (HCC)    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 Early 20's. Normal   ENDARTERECTOMY Right 01/26/2021   Procedure: RIGHT CAROTID ARTERY ENDARTERECTOMY;  Surgeon: Maeola Harman, MD;  Location: [facility] OR;  Service: Vascular;  Laterality: Right;   ESOPHAGOGASTRODUODENOSCOPY (EGD) WITH PROPOFOL N/A 01/02/2018   Procedure: ESOPHAGOGASTRODUODENOSCOPY (EGD) WITH PROPOFOL;  Surgeon: [name], MD;  Location: [facility];  Service: Endoscopy;  Laterality: N/A;   EUS N/A 01/02/2018   Procedure: UPPER ENDOSCOPIC ULTRASOUND (EUS) RADIAL;  Surgeon: [name], MD;  Location: [facility];  Service: Endoscopy;  Laterality: N/A;   FOOT SURGERY     HIP PINNING,CANNULATED Left 06/05/2022   Procedure: PERCUTANEOUS FIXATION OF FEMORAL NECK;  Surgeon: Durene Romans, MD;  Location: [facility] OR;  Service: Orthopedics;  Laterality: Left;   OTHER SURGICAL HISTORY     ovarian surgery   TUBAL LIGATION      Allergies  No Known Allergies  History of Present Illness    Felicia Perkins comes today for pre-operative cardiac evaluation for conversion to left hip arthroplasty on 04/18/2023 with Dr. Durene Romans with [facility]. She is to have spinal anesthesia, and recommendations for holding Plavix are requested.   She is without any cardiac complaints.  Her main complaint is the left hip pain causing her to limp when she walks.  She is able to complete ADLs, she is able to work at [employer] and be on her feet all day.  She denies any dyspnea on exertion, chest discomfort, palpitations, she is medically compliant.  She is trying to stop smoking.  Home Medications    Current Outpatient Medications  Medication Sig Dispense Refill   albuterol (VENTOLIN HFA) 108 (90 Base) MCG/ACT inhaler Inhale 1-2 puffs into the lungs every 6 (six) hours as needed for wheezing or shortness of breath. 8.5 g 2   B Complex-C (B-COMPLEX WITH VITAMIN C) tablet Take 1 tablet by mouth daily. 90 tablet 0    famotidine (PEPCID) 20 MG tablet Take 1 tablet (20 mg total) by mouth 2 (two) times daily. 60 tablet 2   feeding supplement (ENSURE ENLIVE / ENSURE PLUS) LIQD Take 237 mLs by mouth 3 (three) times daily between meals. (Patient taking differently: Take 237 mLs by mouth 2 (two) times daily between meals.) 21330 mL 0   hydrOXYzine (ATARAX) 25 MG tablet Take 25 mg by mouth 3 (three) times daily.     mometasone-formoterol (DULERA) 200-5 MCG/ACT AERO Inhale 2 puffs into the lungs 2 (two) times daily. (Patient taking differently: Inhale 2 puffs into the lungs 2 (two) times daily as needed for shortness of breath or wheezing.)     Multiple Minerals-Vitamins (CALCIUM-MAGNESIUM-ZINC-D3) TABS Take 1 tablet by mouth daily.     Multiple Vitamin (MULTIVITAMIN WITH MINERALS) TABS tablet Take 1 tablet by mouth daily. 90 tablet 0   nicotine (NICODERM CQ - DOSED IN MG/24 HOURS) 14 mg/24hr patch Place 14 mg onto the skin daily as needed (nicotine cessation).     OVER THE COUNTER MEDICATION Take 1 capsule by mouth daily. [place]     pantoprazole (PROTONIX) 40 MG tablet Take 1 tablet (40 mg total) by mouth daily. 30 tablet 6   polyethylene glycol (MIRALAX / GLYCOLAX) 17 g packet Take 17 g by mouth daily. (Patient taking differently: Take 17 g by mouth daily as needed for moderate constipation.) 14 each 0   tiZANidine (ZANAFLEX) 2 MG tablet Take 2 mg by mouth in the morning.     clopidogrel (PLAVIX) 75 MG tablet Take 1 tablet (75 mg total) by mouth daily. 30 tablet 11   ondansetron (ZOFRAN) 4 MG tablet Take 1 tablet (4 mg total) by mouth every 6 (six) hours. (Patient not taking: Reported on 04/02/2023) 12 tablet 0   rosuvastatin (CRESTOR) 10 MG tablet Take 1 tablet (10 mg total) by mouth daily. 30 tablet 11   No current facility-administered medications for this visit.     Family History    Family History  Problem Relation Age of Onset   Hypertension Mother    Breast cancer Mother 41   Pancreatitis Mother     Hypertension Father    Heart attack Cousin 30       died in 41s of MI   Breast cancer Maternal Aunt    Breast cancer Maternal Aunt    Colon cancer Neg Hx    Esophageal cancer Neg Hx    Rectal cancer Neg Hx    Stomach cancer Neg Hx    She indicated that her mother is alive. She indicated that her father is deceased. She indicated that the status of her cousin is unknown. She indicated that the status of her neg hx is unknown.  Social History    Social History   Socioeconomic History   Marital status: Single    Spouse name: Not on file   Number of children: 3   Years of education: Not on file   Highest education level: 12th  grade  Occupational History   Occupation: homemaker  Tobacco Use   Smoking status: Every Day    Current packs/day: 0.25    Types: Cigarettes   Smokeless tobacco: Never   Tobacco comments:    5-6     cigarettes daily  Vaping Use   Vaping status: Never Used  Substance and Sexual Activity   Alcohol use: Not Currently    Comment: 2 glasses   Drug use: No   Sexual activity: Yes    Birth control/protection: Condom  Other Topics Concern   Not on file  Social History Narrative   Not on file   Social Determinants of Health   Financial Resource Strain: Medium Risk (12/04/2022)   Overall Financial Resource Strain (CARDIA)    Difficulty of Paying Living Expenses: Somewhat hard  Food Insecurity: No Food Insecurity (03/11/2023)   Hunger Vital Sign    Worried About Running Out of Food in the Last Year: Never true    Ran Out of Food in the Last Year: Never true  Transportation Needs: No Transportation Needs (03/11/2023)   PRAPARE - Administrator, Civil Service (Medical): No    Lack of Transportation (Non-Medical): No  Physical Activity: Insufficiently Active (12/04/2022)   Exercise Vital Sign    Days of Exercise per Week: 2 days    Minutes of Exercise per Session: 30 min  Stress: Stress Concern Present (12/04/2022)   [employer] of  Occupational Health - Occupational Stress Questionnaire    Feeling of Stress : To some extent  Social Connections: Moderately Isolated (12/04/2022)   Social Connection and Isolation Panel [NHANES]    Frequency of Communication with Friends and Family: More than three times a week    Frequency of Social Gatherings with Friends and Family: Once a week    Attends Religious Services: 1 to 4 times per year    Active Member of [employer] or Organizations: No    Attends Engineer, structural: Not on file    Marital Status: Never married  Intimate Partner Violence: Not At Risk (03/11/2023)   Humiliation, Afraid, Rape, and Kick questionnaire    Fear of Current or Ex-Partner: No    Emotionally Abused: No    Physically Abused: No    Sexually Abused: No     Review of Systems    General:  No chills, fever, night sweats or weight changes.  Cardiovascular:  No chest pain, dyspnea on exertion, edema, orthopnea, palpitations, paroxysmal nocturnal dyspnea. Dermatological: No rash, lesions/masses Respiratory: No cough, dyspnea Urologic: No hematuria, dysuria Abdominal:   No nausea, vomiting, diarrhea, bright red blood per rectum, melena, or hematemesis Neurologic:  No visual changes, wkns, changes in mental status. All other systems reviewed and are otherwise negative except as noted above.       Physical Exam    VS:  BP 126/88   Pulse 67   Ht 5\' 4"  (1.626 m)   Wt 109 lb (49.4 kg)   SpO2 98%   BMI 18.71 kg/m  , BMI Body mass index is 18.71 kg/m.     GEN: Well nourished, well developed, in no acute distress. HEENT: normal. Neck: Supple, no JVD, carotid bruits, or masses. Cardiac: RRR, no murmurs, rubs, or gallops. No clubbing, cyanosis, edema.  Radials/DP/PT 2+ and equal bilaterally.  Respiratory:  Respirations regular and unlabored, clear to auscultation bilaterally. GI: Soft, nontender, nondistended, BS + x 4. MS: no deformity or atrophy.  Pain with ROM left hip Skin: warm and  dry, no  rash. Neuro:  Strength and sensation are intact. Psych: Normal affect.      Lab Results  Component Value Date   WBC 9.0 04/08/2023   HGB 13.9 04/08/2023   HCT 40.9 04/08/2023   MCV 99.8 04/08/2023   PLT 270 04/08/2023   Lab Results  Component Value Date   CREATININE 0.60 04/08/2023   BUN 8 04/08/2023   NA 133 (L) 04/08/2023   K 3.8 04/08/2023   CL 98 04/08/2023   CO2 25 04/08/2023   Lab Results  Component Value Date   ALT 7 03/13/2023   AST 23 03/13/2023   ALKPHOS 68 03/13/2023   BILITOT 1.0 03/13/2023   Lab Results  Component Value Date   CHOL 215 (H) 01/24/2022   HDL 79 11/06/2021   LDLCALC 83 11/06/2021   TRIG 232 (H) 01/24/2022   CHOLHDL 3.7 03/22/2019    Lab Results  Component Value Date   HGBA1C 4.6 08/25/2019     Review of Prior Studies Echocardiogram 01/03/2021    1. Left ventricular ejection fraction, by estimation, is 70 to 75%. The  left ventricle has hyperdynamic function. The left ventricle has no  regional wall motion abnormalities. Left ventricular diastolic parameters  were normal.   2. Right ventricular systolic function is normal. The right ventricular  size is normal.   3. The mitral valve is normal in structure. No evidence of mitral valve  regurgitation.   4. The aortic valve is normal in structure. Aortic valve regurgitation is  not visualized.   CTA Cardiac 12/12/2020 IMPRESSION: 1. Calcium score 0   2.  Normal right dominant coronary arteries   3.  Normal aortic root 2.9 cm Assessment & Plan   1.  Pre-Op Clearance:  According to the Revised Cardiac Risk Index (RCRI), her Perioperative Risk of Major Cardiac Event is (%): 0.9  Her Functional Capacity in METs is: 5.72 according to the Duke Activity Status Index (DASI).   She is limited on strenuous activities due to left hip pain leading to need for total hip replacement during this preop evaluation.  Per office protocol, if patient is without any new symptoms or concerns at  the time of their virtual visit, he/she may hold Plavix for 5 days prior to procedure. Please resume Plavix as soon as possible postprocedure, at the discretion of the surgeon.   Therefore, based on ACC/AHA guidelines, patient would be at acceptable risk for the planned procedure without further cardiovascular testing. I will route this recommendation to the requesting party via Epic fax function.  2. Hyperlipidemia: Continue Atorvastatin as directed.Refills provided. Labs per PCP  3. Tobacco abuse:  Trying to quit. Has nicotine patch. Encouraged cessation perioperatively  4. Carotid Artery disease: Followed by [facility]. Sees them annually.         Signed, Bettey Mare. Liborio Nixon, ANP, AACC   04/11/2023 8:15 AM      Office [phone] Fax [phone]  Notice: This dictation was prepared with Dragon dictation along with smaller phrase technology. Any transcriptional errors that result from this process are unintentional and may not be corrected upon review.

## 2023-04-11 ENCOUNTER — Ambulatory Visit: Payer: 59 | Attending: Adult Health | Admitting: Adult Health

## 2023-04-11 ENCOUNTER — Encounter: Payer: Self-pay | Admitting: Adult Health

## 2023-04-11 ENCOUNTER — Other Ambulatory Visit: Payer: Self-pay

## 2023-04-11 VITALS — BP 126/88 | HR 67 | Ht 64.0 in | Wt 109.0 lb

## 2023-04-11 DIAGNOSIS — Z720 Tobacco use: Secondary | ICD-10-CM | POA: Diagnosis not present

## 2023-04-11 DIAGNOSIS — E7800 Pure hypercholesterolemia, unspecified: Secondary | ICD-10-CM | POA: Diagnosis not present

## 2023-04-11 DIAGNOSIS — I6529 Occlusion and stenosis of unspecified carotid artery: Secondary | ICD-10-CM

## 2023-04-11 DIAGNOSIS — Z01818 Encounter for other preprocedural examination: Secondary | ICD-10-CM | POA: Diagnosis not present

## 2023-04-11 MED ORDER — CLOPIDOGREL BISULFATE 75 MG PO TABS: 75.0000 mg | ORAL_TABLET | Freq: Every day | ORAL | 11 refills | Status: DC

## 2023-04-11 MED ORDER — ROSUVASTATIN CALCIUM 10 MG PO TABS: 10.0000 mg | ORAL_TABLET | Freq: Every day | ORAL | 11 refills | Status: DC

## 2023-04-11 NOTE — Patient Instructions (Signed)
Medication Instructions:  Your physician recommends that you continue on your current medications as directed. Please refer to the Current Medication list given to you today. *If you need a refill on your cardiac medications before your next appointment, please call your pharmacy*   Lab Work: No labs If you have labs (blood work) drawn today and your tests are completely normal, you will receive your results only by: MyChart Message (if you have MyChart) OR A paper copy in the mail If you have any lab test that is abnormal or we need to change your treatment, we will call you to review the results.   Testing/Procedures: No testing   Follow-Up: At [facility], you and your health needs are our priority.  As part of our continuing mission to provide you with exceptional heart care, we have created designated Provider Care Teams.  These Care Teams include your primary Cardiologist (physician) and Advanced Practice Providers (APPs -  Physician Assistants and Nurse Practitioners) who all work together to provide you with the care you need, when you need it.  We recommend signing up for the patient portal called "MyChart".  Sign up information is provided on this After Visit Summary.  MyChart is used to connect with patients for Virtual Visits (Telemedicine).  Patients are able to view lab/test results, encounter notes, upcoming appointments, etc.  Non-urgent messages can be sent to your provider as well.   To learn more about what you can do with MyChart, go to [email or web address].    Your next appointment:   As needed  Provider:   Thomasene Ripple, DO

## 2023-04-16 ENCOUNTER — Encounter: Payer: Self-pay | Admitting: Family Medicine

## 2023-04-16 ENCOUNTER — Ambulatory Visit: Payer: 59 | Attending: Family Medicine | Admitting: Family Medicine

## 2023-04-16 VITALS — BP 124/83 | HR 69 | Ht 64.0 in | Wt 108.8 lb

## 2023-04-16 DIAGNOSIS — Z01818 Encounter for other preprocedural examination: Secondary | ICD-10-CM

## 2023-04-16 DIAGNOSIS — Z23 Encounter for immunization: Secondary | ICD-10-CM

## 2023-04-16 DIAGNOSIS — F17210 Nicotine dependence, cigarettes, uncomplicated: Secondary | ICD-10-CM

## 2023-04-16 DIAGNOSIS — M87052 Idiopathic aseptic necrosis of left femur: Secondary | ICD-10-CM

## 2023-04-16 DIAGNOSIS — I10 Essential (primary) hypertension: Secondary | ICD-10-CM | POA: Diagnosis not present

## 2023-04-16 DIAGNOSIS — Z720 Tobacco use: Secondary | ICD-10-CM

## 2023-04-16 MED ORDER — ONDANSETRON HCL 4 MG PO TABS: 4.0000 mg | ORAL_TABLET | Freq: Three times a day (TID) | ORAL | 0 refills | Status: DC | PRN

## 2023-04-16 NOTE — Patient Instructions (Signed)

## 2023-04-16 NOTE — Progress Notes (Signed)
Subjective:  Patient ID: Felicia Perkins, female    DOB: May 16, 1976  Age: 47 y.o. MRN: [id number]  CC: Pre-op Exam (No questions or concerns.)   HPI Felicia Perkins is a 47 y.o. year old female with a history of alcoholic liver cirrhosis, chronic alcoholic pancreatitis, COPD,  R ICA stenosis (s/p R CEA in 01/2021) stenosis, tobacco abuse, s/p right carotid endarterectomy in 01/2021 due to right ICA stenosis, h/o closed fracture of the left hip status post percutaneous fixation of left femoral neck.   Interval History: Discussed the use of AI scribe software for clinical note transcription with the patient, who gave verbal consent to proceed.   The patient, with a history of hip pain secondary to left hip avascular necrosis, is scheduled for hip surgery on 04/18/2023 and presents for preoperative evaluation.  She has been in significant pain, which has affected her ability to work. She has been managing to work seven hours a day, up from five, in preparation for her time off for surgery and recovery. She is on FMLA and does not qualify for disability, so she is trying to ensure she has some income during her recovery period. She is nervous about the surgery but is looking forward to potential relief from the pain. She will be cared for post-operatively by her daughter and significant other, and will have home health assistance. In addition to her hip pain, the patient has been experiencing difficulty with her leg. She has to pop it to move it back down when it comes up while she is sleeping, and she describes the sensation as feeling like bone on bone.  She has no active chest pain or dyspnea and in the absence of her left hip pain Felicia Perkins would be otherwise able to climb a flight of stairs. She did see the cardiology nurse practitioner on 03/2023 and was cleared for her procedure.  The patient has also been working on quitting smoking and has reduced her intake to one cigarette a week. She has  noticed that her desire to smoke is linked to her alcohol consumption, and as she has reduced her drinking, her need for cigarettes has also decreased. She has been using patches to assist with quitting and has noticed that cigarettes now taste funny to her. She reports feeling better and having more energy since reducing her smoking and drinking, although her mobility is still limited due to her hip pain.        Past Medical History:  Diagnosis Date   Acute on chronic pancreatitis (HCC) 03/22/2019   Acute pancreatitis    Alcohol abuse    Alcohol dependence (HCC) 12/16/2012   Medical detox successful    Alcohol induced acute pancreatitis 11/22/2017   Alcoholic cirrhosis of liver without ascites (HCC) 10/15/2016   Anemia    Anxiety    Aortic atherosclerosis (HCC)    Bipolar 1 disorder (HCC)    Cancer (HCC)    cervical cancer   Chest pain with moderate risk for cardiac etiology 12/15/2014   Cirrhosis (HCC)    COPD (chronic obstructive pulmonary disease) (HCC) 09/04/2016   Coronary artery disease    Depression    Duodenitis 11/22/2017   Eczema 10/09/2018   Elevated liver enzymes 12/22/2014   Fracture of femoral neck, left (HCC) 06/04/2022   Gastritis and gastroduodenitis 09/04/2016   GERD (gastroesophageal reflux disease)    Hip fracture (HCC) 06/03/2022   Nondisplaced fracture of neck of fifth metacarpal bone, right hand, initial encounter for  closed fracture 08/26/2017   Pancreatitis    Peripheral vascular disease (HCC)    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 Early 20's. Normal   ENDARTERECTOMY Right 01/26/2021    Procedure: RIGHT CAROTID ARTERY ENDARTERECTOMY;  Surgeon: Maeola Harman, MD;  Location: [facility] OR;  Service: Vascular;  Laterality: Right;   ESOPHAGOGASTRODUODENOSCOPY (EGD) WITH PROPOFOL N/A 01/02/2018   Procedure: ESOPHAGOGASTRODUODENOSCOPY (EGD) WITH PROPOFOL;  Surgeon: [name], MD;  Location: [facility];  Service: Endoscopy;  Laterality: N/A;   EUS N/A 01/02/2018   Procedure: UPPER ENDOSCOPIC ULTRASOUND (EUS) RADIAL;  Surgeon: [name], MD;  Location: [facility];  Service: Endoscopy;  Laterality: N/A;   FOOT SURGERY     HIP PINNING,CANNULATED Left 06/05/2022   Procedure: PERCUTANEOUS FIXATION OF FEMORAL NECK;  Surgeon: Durene Romans, MD;  Location: [facility] OR;  Service: Orthopedics;  Laterality: Left;   OTHER SURGICAL HISTORY     ovarian surgery   TUBAL LIGATION      Family History  Problem Relation Age of Onset   Hypertension Mother    Breast cancer Mother 14   Pancreatitis Mother    Hypertension Father    Heart attack Cousin 30       died in 65s of MI   Breast cancer Maternal Aunt    Breast cancer Maternal Aunt    Colon cancer Neg Hx    Esophageal cancer Neg Hx    Rectal cancer Neg Hx    Stomach cancer Neg Hx     Social History   Socioeconomic History   Marital status: Single    Spouse name: Not on file   Number of children: 3   Years of education: Not on file   Highest education level: 12th grade  Occupational History   Occupation: homemaker  Tobacco Use   Smoking status: Every Day    Current packs/day: 0.25    Types: Cigarettes   Smokeless tobacco: Never   Tobacco comments:    5-6     cigarettes daily  Vaping Use   Vaping status: Never Used  Substance and Sexual Activity   Alcohol use: Not Currently    Comment: 2 glasses   Drug use: No   Sexual activity: Yes    Birth control/protection: Condom  Other Topics Concern   Not on file  Social History Narrative   Not on file   Social Determinants of Health   Financial Resource  Strain: Medium Risk (12/04/2022)   Overall Financial Resource Strain (CARDIA)    Difficulty of Paying Living Expenses: Somewhat hard  Food Insecurity: No Food Insecurity (03/11/2023)   Hunger Vital Sign    Worried About Running Out of Food in the Last Year: Never true    Ran Out of Food in the Last Year: Never true  Transportation Needs: No Transportation Needs (03/11/2023)   PRAPARE - Administrator, Civil Service (Medical): No    Lack of Transportation (Non-Medical): No  Physical Activity: Insufficiently Active (12/04/2022)   Exercise Vital Sign    Days of Exercise per Week: 2 days  Minutes of Exercise per Session: 30 min  Stress: Stress Concern Present (12/04/2022)   [employer] of Occupational Health - Occupational Stress Questionnaire    Feeling of Stress : To some extent  Social Connections: Moderately Isolated (12/04/2022)   Social Connection and Isolation Panel [NHANES]    Frequency of Communication with Friends and Family: More than three times a week    Frequency of Social Gatherings with Friends and Family: Once a week    Attends Religious Services: 1 to 4 times per year    Active Member of [employer] or Organizations: No    Attends Engineer, structural: Not on file    Marital Status: Never married    No Known Allergies  Outpatient Medications Prior to Visit  Medication Sig Dispense Refill   albuterol (VENTOLIN HFA) 108 (90 Base) MCG/ACT inhaler Inhale 1-2 puffs into the lungs every 6 (six) hours as needed for wheezing or shortness of breath. 8.5 g 2   B Complex-C (B-COMPLEX WITH VITAMIN C) tablet Take 1 tablet by mouth daily. 90 tablet 0   clopidogrel (PLAVIX) 75 MG tablet Take 1 tablet (75 mg total) by mouth daily. 30 tablet 11   famotidine (PEPCID) 20 MG tablet Take 1 tablet (20 mg total) by mouth 2 (two) times daily. 60 tablet 2   hydrOXYzine (ATARAX) 25 MG tablet Take 25 mg by mouth 3 (three) times daily.     mometasone-formoterol (DULERA) 200-5  MCG/ACT AERO Inhale 2 puffs into the lungs 2 (two) times daily. (Patient taking differently: Inhale 2 puffs into the lungs 2 (two) times daily as needed for shortness of breath or wheezing.)     Multiple Minerals-Vitamins (CALCIUM-MAGNESIUM-ZINC-D3) TABS Take 1 tablet by mouth daily.     Multiple Vitamin (MULTIVITAMIN WITH MINERALS) TABS tablet Take 1 tablet by mouth daily. 90 tablet 0   nicotine (NICODERM CQ - DOSED IN MG/24 HOURS) 14 mg/24hr patch Place 14 mg onto the skin daily as needed (nicotine cessation).     OVER THE COUNTER MEDICATION Take 1 capsule by mouth daily. [place]     pantoprazole (PROTONIX) 40 MG tablet Take 1 tablet (40 mg total) by mouth daily. 30 tablet 6   polyethylene glycol (MIRALAX / GLYCOLAX) 17 g packet Take 17 g by mouth daily. (Patient taking differently: Take 17 g by mouth daily as needed for moderate constipation.) 14 each 0   rosuvastatin (CRESTOR) 10 MG tablet Take 1 tablet (10 mg total) by mouth daily. 30 tablet 11   tiZANidine (ZANAFLEX) 2 MG tablet Take 2 mg by mouth in the morning.     ondansetron (ZOFRAN) 4 MG tablet Take 1 tablet (4 mg total) by mouth every 6 (six) hours. 12 tablet 0   No facility-administered medications prior to visit.     ROS Review of Systems  Constitutional:  Negative for activity change and appetite change.  HENT:  Negative for sinus pressure and sore throat.   Respiratory:  Negative for chest tightness, shortness of breath and wheezing.   Cardiovascular:  Negative for chest pain and palpitations.  Gastrointestinal:  Negative for abdominal distention, abdominal pain and constipation.  Genitourinary: Negative.   Musculoskeletal:        See HPI  Psychiatric/Behavioral:  Negative for behavioral problems and dysphoric mood.     Objective:  BP 124/83 (BP Location: Right Arm, Patient Position: Sitting, Cuff Size: Normal)   Pulse 69   Ht 5\' 4"  (1.626 m)   Wt 108 lb 12.8 oz (49.4  kg)   SpO2 99%   BMI 18.68 kg/m       04/16/2023    2:15 PM 04/11/2023    7:50 AM 04/08/2023    1:05 PM  BP/Weight  Systolic BP 124 126 134  Diastolic BP 83 88 82  Wt. (Lbs) 108.8 109 107  BMI 18.68 kg/m2 18.71 kg/m2 18.37 kg/m2      Physical Exam Constitutional:      Appearance: She is well-developed.  Cardiovascular:     Rate and Rhythm: Normal rate.     Heart sounds: Normal heart sounds. No murmur heard. Pulmonary:     Effort: Pulmonary effort is normal.     Breath sounds: Normal breath sounds. No wheezing or rales.  Chest:     Chest wall: No tenderness.  Abdominal:     General: Bowel sounds are normal. There is no distension.     Palpations: Abdomen is soft. There is no mass.     Tenderness: There is no abdominal tenderness.  Musculoskeletal:        General: Normal range of motion.     Right lower leg: No edema.     Left lower leg: No edema.  Neurological:     Mental Status: She is alert and oriented to person, place, and time.  Psychiatric:        Mood and Affect: Mood normal.        Latest Ref Rng & Units 04/08/2023    1:43 PM 03/13/2023    6:08 AM 03/12/2023    5:35 AM  CMP  Glucose 70 - 99 mg/dL 025  97  92   BUN 6 - 20 mg/dL 8  <5  <5   Creatinine 0.44 - 1.00 mg/dL 4.27  0.62  3.76   Sodium 135 - 145 mmol/L 133  136  136   Potassium 3.5 - 5.1 mmol/L 3.8  4.1  4.2   Chloride 98 - 111 mmol/L 98  101  102   CO2 22 - 32 mmol/L 25  26  26    Calcium 8.9 - 10.3 mg/dL 9.3  9.0  8.9   Total Protein 6.5 - 8.1 g/dL  6.1  6.4   Total Bilirubin 0.3 - 1.2 mg/dL  1.0  1.1   Alkaline Phos 38 - 126 U/L  68  77   AST 15 - 41 U/L  23  19   ALT 0 - 44 U/L  7  11     Lipid Panel     Component Value Date/Time   CHOL 215 (H) 01/24/2022 1054   TRIG 232 (H) 01/24/2022 1054   HDL 79 11/06/2021 1046   CHOLHDL 3.7 03/22/2019 1428   VLDL 18 03/22/2019 1428   LDLCALC 83 11/06/2021 1046    CBC    Component Value Date/Time   WBC 9.0 04/08/2023 1343   RBC 4.10 04/08/2023 1343   HGB 13.9 04/08/2023 1343   HGB  12.6 11/06/2021 1046   HCT 40.9 04/08/2023 1343   HCT 38.7 11/06/2021 1046   PLT 270 04/08/2023 1343   PLT 244 11/06/2021 1046   MCV 99.8 04/08/2023 1343   MCV 101 (H) 11/06/2021 1046   MCH 33.9 04/08/2023 1343   MCHC 34.0 04/08/2023 1343   RDW 13.3 04/08/2023 1343   RDW 13.1 11/06/2021 1046   LYMPHSABS 1.1 06/05/2022 0347   LYMPHSABS 3.2 (H) 11/06/2021 1046   MONOABS 0.6 06/05/2022 0347   EOSABS 0.0 06/05/2022 0347   EOSABS 0.1 11/06/2021 1046  BASOSABS 0.0 06/05/2022 0347   BASOSABS 0.1 11/06/2021 1046    Lab Results  Component Value Date   HGBA1C 4.6 08/25/2019    Assessment & Plan:      Left hip avascular necrosis Severe pain and functional impairment. Surgery scheduled for Thursday. -Last CTA was negative for CAD, revealed presence of mild aortic atherosclerosis -Medically optimized for moderate risk procedure. -Low risk of MACE per cardiology notes -Preoperative clearance provided by cardiology -She does have support in place for postop period  Tobacco and Alcohol Use Patient reports significant reduction in smoking and alcohol consumption. -Encourage continued cessation efforts.  Nausea Patient requests refill of Zofran for as-needed use. -Prescribe Zofran as requested.  Status post CEA Patient reports cessation of Plavix prior to surgery. -Ensure appropriate management of cardiovascular medications in the perioperative period.          Meds ordered this encounter  Medications   ondansetron (ZOFRAN) 4 MG tablet    Sig: Take 1 tablet (4 mg total) by mouth every 8 (eight) hours as needed for nausea or vomiting.    Dispense:  20 tablet    Refill:  0    Follow-up: Return in about 3 months (around 07/16/2023) for Chronic medical conditions.       Hoy Register, MD, FAAFP. [facility] [place], [place] [phone]   04/17/2023, 12:33 PM

## 2023-04-17 NOTE — Progress Notes (Signed)
Anesthesia Chart Review   Case: [id number] Date/Time: 04/18/23 0845   Procedure: TOTAL HIP ARTHROPLASTY ANTERIOR APPROACH (Left: Hip)   Anesthesia type: Spinal   Pre-op diagnosis: Status post cannulated screw fixation and avascular necrosis left hip   [facility] / [facility]   Surgeons: Durene Romans, MD       DISCUSSION:47 y.o. smoker with h/o PTSD, bipolar 1 disorder, alcohol abuse, alcoholic cirrhosis, chronic alcoholic pancreatitis, COPD, PVD, CAD, s/p right carotid endarterectomy 01/2021, s/p cannulated screw fixation and AVN of left hip scheduled for above procedure 04/18/2023 with Dr. Durene Romans.  Patient left in by cardiology 04/11/2023.  Per office visit note, "According to the Revised Cardiac Risk Index (RCRI), her Perioperative Risk of Major Cardiac Event is (%): 0.9 Her Functional Capacity in METs is: 5.72 according to the Duke Activity Status Index (DASI).  She is limited on strenuous activities due to left hip pain leading to need for total hip replacement during this preop evaluation. Per office protocol, if patient is without any new symptoms or concerns at the time of their virtual visit, he/she may hold Plavix for 5 days prior to procedure. Please resume Plavix as soon as possible postprocedure, at the discretion of the surgeon.  Therefore, based on ACC/AHA guidelines, patient would be at acceptable risk for the planned procedure without further cardiovascular testing. I will route this recommendation to the requesting party via Epic fax function."  Pt seen by PCP 04/17/2023, notes pending.  VS: BP 134/82   Pulse 76   Temp 36.9 C (Oral)   Resp 16   Ht 5\' 4"  (1.626 m)   Wt 48.5 kg   SpO2 100%   BMI 18.37 kg/m   PROVIDERS: Hoy Register, MD is PCP  Primary Cardiologist:  Thomasene Ripple, DO  LABS: Labs reviewed: Acceptable for surgery. (all labs ordered are listed, but only abnormal results are displayed)  Labs Reviewed  BASIC METABOLIC PANEL - Abnormal; Notable  for the following components:      Result Value   Sodium 133 (*)    Glucose, Bld 104 (*)    All other components within normal limits  SURGICAL PCR SCREEN  CBC  TYPE AND SCREEN     IMAGES:   EKG:   CV: Echo 01/03/2021 1. Left ventricular ejection fraction, by estimation, is 70 to 75%. The  left ventricle has hyperdynamic function. The left ventricle has no  regional wall motion abnormalities. Left ventricular diastolic parameters  were normal.   2. Right ventricular systolic function is normal. The right ventricular  size is normal.   3. The mitral valve is normal in structure. No evidence of mitral valve  regurgitation.   4. The aortic valve is normal in structure. Aortic valve regurgitation is  not visualized.   Past Medical History:  Diagnosis Date   Acute on chronic pancreatitis (HCC) 03/22/2019   Acute pancreatitis    Alcohol abuse    Alcohol dependence (HCC) 12/16/2012   Medical detox successful    Alcohol induced acute pancreatitis 11/22/2017   Alcoholic cirrhosis of liver without ascites (HCC) 10/15/2016   Anemia    Anxiety    Aortic atherosclerosis (HCC)    Bipolar 1 disorder (HCC)    Cancer (HCC)    cervical cancer   Chest pain with moderate risk for cardiac etiology 12/15/2014   Cirrhosis (HCC)    COPD (chronic obstructive pulmonary disease) (HCC) 09/04/2016   Coronary artery disease    Depression    Duodenitis  11/22/2017   Eczema 10/09/2018   Elevated liver enzymes 12/22/2014   Fracture of femoral neck, left (HCC) 06/04/2022   Gastritis and gastroduodenitis 09/04/2016   GERD (gastroesophageal reflux disease)    Hip fracture (HCC) 06/03/2022   Nondisplaced fracture of neck of fifth metacarpal bone, right hand, initial encounter for closed fracture 08/26/2017   Pancreatitis    Peripheral vascular disease (HCC)    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   PTSD (post-traumatic stress disorder)    Tobacco abuse 09/04/2016   Tobacco abuse counseling     Vascular disease     Past Surgical History:  Procedure Laterality Date   ABDOMINAL HYSTERECTOMY  2004   BIOPSY  01/02/2018   Procedure: BIOPSY;  Surgeon: [name], MD;  [facility];  Service: Endoscopy;;   COLONOSCOPY     Done in [place] she thinks. Early 20's. Normal   ENDARTERECTOMY Right 01/26/2021   Procedure: RIGHT CAROTID ARTERY ENDARTERECTOMY;  Surgeon: Maeola Harman, MD;  Location: [facility] OR;  Service: Vascular;  Laterality: Right;   ESOPHAGOGASTRODUODENOSCOPY (EGD) WITH PROPOFOL N/A 01/02/2018   Procedure: ESOPHAGOGASTRODUODENOSCOPY (EGD) WITH PROPOFOL;  Surgeon: [name], MD;  [facility];  Service: Endoscopy;  Laterality: N/A;   EUS N/A 01/02/2018   Procedure: UPPER ENDOSCOPIC ULTRASOUND (EUS) RADIAL;  Surgeon: [name], MD;  [facility];  Service: Endoscopy;  Laterality: N/A;   FOOT SURGERY     HIP PINNING,CANNULATED Left 06/05/2022   Procedure: PERCUTANEOUS FIXATION OF FEMORAL NECK;  Surgeon: Durene Romans, MD;  Location: [facility] OR;  Service: Orthopedics;  Laterality: Left;   OTHER SURGICAL HISTORY     ovarian surgery   TUBAL LIGATION      MEDICATIONS:  albuterol (VENTOLIN HFA) 108 (90 Base) MCG/ACT inhaler   B Complex-C (B-COMPLEX WITH VITAMIN C) tablet   clopidogrel (PLAVIX) 75 MG tablet   famotidine (PEPCID) 20 MG tablet   hydrOXYzine (ATARAX) 25 MG tablet   mometasone-formoterol (DULERA) 200-5 MCG/ACT AERO   Multiple Minerals-Vitamins (CALCIUM-MAGNESIUM-ZINC-D3) TABS   Multiple Vitamin (MULTIVITAMIN WITH MINERALS) TABS tablet   nicotine (NICODERM CQ - DOSED IN MG/24 HOURS) 14 mg/24hr patch   ondansetron (ZOFRAN) 4 MG tablet   OVER THE COUNTER MEDICATION   pantoprazole (PROTONIX) 40 MG tablet   polyethylene glycol (MIRALAX / GLYCOLAX) 17 g packet   rosuvastatin (CRESTOR) 10 MG tablet   tiZANidine (ZANAFLEX) 2 MG tablet   No current facility-administered medications for this encounter.     Jodell Cipro Ward, PA-C [facility] [phone]

## 2023-04-17 NOTE — Anesthesia Preprocedure Evaluation (Signed)
Anesthesia Evaluation  Patient identified by MRN, date of birth, ID band Patient awake    Reviewed: Allergy & Precautions, H&P , NPO status , Patient's Chart, lab work & pertinent test results  Airway Mallampati: I  TM Distance: >3 FB Neck ROM: Full    Dental  (+) Poor Dentition, Chipped, Missing, Caps, Dental Advisory Given   Pulmonary COPD, Current Smoker and Patient abstained from smoking.   Pulmonary exam normal breath sounds clear to auscultation       Cardiovascular Exercise Tolerance: Good hypertension, Pt. on medications + CAD and + Peripheral Vascular Disease  Normal cardiovascular exam Rhythm:Regular Rate:Normal  S/P right Carotid endarterectomy   Neuro/Psych  PSYCHIATRIC DISORDERS Anxiety Depression Bipolar Disorder   PTSD   GI/Hepatic ,GERD  Medicated and Controlled,,(+) Cirrhosis     substance abuse  alcohol useHx/o chronic pancreatitis   Endo/Other  Hyperlipidemia  Renal/GU negative Renal ROS  negative genitourinary   Musculoskeletal AVN left hip S/P cannulated screw   Abdominal   Peds negative pediatric ROS (+)  Hematology  (+) Blood dyscrasia, anemia Plavix therapy- last dose 8/28   Anesthesia Other Findings   Reproductive/Obstetrics Hx/o cervical Ca                             Anesthesia Physical Anesthesia Plan  ASA: 3  Anesthesia Plan: Spinal   Post-op Pain Management: Tylenol PO (pre-op)* and Dilaudid IV   Induction: Intravenous  PONV Risk Score and Plan: 4 or greater and Ondansetron, Dexamethasone, Treatment may vary due to age or medical condition and Midazolam  Airway Management Planned: Natural Airway and Simple Face Mask  Additional Equipment: None  Intra-op Plan:   Post-operative Plan:   Informed Consent: I have reviewed the patients History and Physical, chart, labs and discussed the procedure including the risks, benefits and alternatives for  the proposed anesthesia with the patient or authorized representative who has indicated his/her understanding and acceptance.       Plan Discussed with: Anesthesiologist and CRNA  Anesthesia Plan Comments: (See PAT note 04/08/23)        Anesthesia Quick Evaluation

## 2023-04-18 ENCOUNTER — Ambulatory Visit (HOSPITAL_COMMUNITY): Payer: 59 | Admitting: Physician Assistant

## 2023-04-18 ENCOUNTER — Ambulatory Visit (HOSPITAL_BASED_OUTPATIENT_CLINIC_OR_DEPARTMENT_OTHER): Payer: 59 | Admitting: Anesthesiology

## 2023-04-18 ENCOUNTER — Observation Stay (HOSPITAL_COMMUNITY)
Admission: RE | Admit: 2023-04-18 | Discharge: 2023-04-19 | Disposition: A | Discharge status: Home / Self Care | Payer: 59 | Attending: Orthopedic Surgery | Admitting: Orthopedic Surgery

## 2023-04-18 ENCOUNTER — Other Ambulatory Visit: Payer: Self-pay

## 2023-04-18 ENCOUNTER — Encounter (HOSPITAL_COMMUNITY): Payer: Self-pay | Admitting: Orthopedic Surgery

## 2023-04-18 ENCOUNTER — Encounter (HOSPITAL_COMMUNITY)
Admission: RE | Disposition: A | Discharge status: Home / Self Care | Payer: Self-pay | Source: Home / Self Care | Attending: Orthopedic Surgery

## 2023-04-18 ENCOUNTER — Ambulatory Visit (HOSPITAL_COMMUNITY): Payer: 59

## 2023-04-18 ENCOUNTER — Observation Stay (HOSPITAL_COMMUNITY): Payer: 59

## 2023-04-18 DIAGNOSIS — M1612 Unilateral primary osteoarthritis, left hip: Secondary | ICD-10-CM | POA: Diagnosis not present

## 2023-04-18 DIAGNOSIS — F17210 Nicotine dependence, cigarettes, uncomplicated: Secondary | ICD-10-CM | POA: Diagnosis not present

## 2023-04-18 DIAGNOSIS — T8489XA Other specified complication of internal orthopedic prosthetic devices, implants and grafts, initial encounter: Secondary | ICD-10-CM

## 2023-04-18 DIAGNOSIS — M87852 Other osteonecrosis, left femur: Secondary | ICD-10-CM | POA: Diagnosis not present

## 2023-04-18 DIAGNOSIS — I10 Essential (primary) hypertension: Secondary | ICD-10-CM | POA: Insufficient documentation

## 2023-04-18 DIAGNOSIS — Z8541 Personal history of malignant neoplasm of cervix uteri: Secondary | ICD-10-CM | POA: Diagnosis not present

## 2023-04-18 DIAGNOSIS — Z96642 Presence of left artificial hip joint: Secondary | ICD-10-CM | POA: Diagnosis not present

## 2023-04-18 DIAGNOSIS — I2510 Atherosclerotic heart disease of native coronary artery without angina pectoris: Secondary | ICD-10-CM | POA: Insufficient documentation

## 2023-04-18 DIAGNOSIS — J449 Chronic obstructive pulmonary disease, unspecified: Secondary | ICD-10-CM | POA: Diagnosis not present

## 2023-04-18 DIAGNOSIS — M87052 Idiopathic aseptic necrosis of left femur: Principal | ICD-10-CM

## 2023-04-18 DIAGNOSIS — Z471 Aftercare following joint replacement surgery: Secondary | ICD-10-CM | POA: Diagnosis not present

## 2023-04-18 HISTORY — PX: TOTAL HIP ARTHROPLASTY: SHX124

## 2023-04-18 LAB — TYPE AND SCREEN
ABO/RH(D): B POS
Antibody Screen: NEGATIVE

## 2023-04-18 SURGERY — ARTHROPLASTY, HIP, TOTAL, ANTERIOR APPROACH
Anesthesia: Spinal | Site: Hip | Laterality: Left

## 2023-04-18 MED ORDER — PROPOFOL 10 MG/ML IV BOLUS
INTRAVENOUS | Status: DC | PRN
  Administered 2023-04-18 (×4): 30 mg via INTRAVENOUS

## 2023-04-18 MED ORDER — POVIDONE-IODINE 10 % EX SWAB
2.0000 | Freq: Once | CUTANEOUS | Status: AC
  Administered 2023-04-18: 2 via TOPICAL

## 2023-04-18 MED ORDER — DROPERIDOL 2.5 MG/ML IJ SOLN: 0.6250 mg | Freq: Once | INTRAMUSCULAR | Status: DC | PRN

## 2023-04-18 MED ORDER — LACTATED RINGERS IV SOLN: INTRAVENOUS | Status: DC

## 2023-04-18 MED ORDER — MIDAZOLAM HCL 2 MG/2ML IJ SOLN
INTRAMUSCULAR | Status: AC
  Filled 2023-04-18: qty 2

## 2023-04-18 MED ORDER — DIPHENHYDRAMINE HCL 12.5 MG/5ML PO ELIX: 12.5000 mg | ORAL_SOLUTION | ORAL | Status: DC | PRN

## 2023-04-18 MED ORDER — STERILE WATER FOR IRRIGATION IR SOLN
Status: DC | PRN
  Administered 2023-04-18: 2000 mL

## 2023-04-18 MED ORDER — BUPIVACAINE IN DEXTROSE 0.75-8.25 % IT SOLN
INTRATHECAL | Status: DC | PRN
  Administered 2023-04-18: 1.8 mL via INTRATHECAL

## 2023-04-18 MED ORDER — BUPIVACAINE-EPINEPHRINE 0.25% -1:200000 IJ SOLN
INTRAMUSCULAR | Status: AC
  Filled 2023-04-18: qty 1

## 2023-04-18 MED ORDER — METHOCARBAMOL 500 MG IVPB - SIMPLE MED: 500.0000 mg | Freq: Once | INTRAVENOUS | Status: AC

## 2023-04-18 MED ORDER — OXYCODONE HCL 5 MG PO TABS: 5.0000 mg | ORAL_TABLET | Freq: Once | ORAL | Status: DC | PRN

## 2023-04-18 MED ORDER — KETOROLAC TROMETHAMINE 30 MG/ML IJ SOLN
INTRAMUSCULAR | Status: AC
  Filled 2023-04-18: qty 1

## 2023-04-18 MED ORDER — DEXAMETHASONE SODIUM PHOSPHATE 10 MG/ML IJ SOLN
10.0000 mg | Freq: Once | INTRAMUSCULAR | Status: AC
  Administered 2023-04-19: 10 mg via INTRAVENOUS
  Filled 2023-04-18: qty 1

## 2023-04-18 MED ORDER — FAMOTIDINE 20 MG PO TABS
20.0000 mg | ORAL_TABLET | Freq: Two times a day (BID) | ORAL | Status: DC
  Administered 2023-04-18 – 2023-04-19 (×2): 20 mg via ORAL
  Filled 2023-04-18 (×2): qty 1

## 2023-04-18 MED ORDER — PHENOL 1.4 % MT LIQD: 1.0000 | OROMUCOSAL | Status: DC | PRN

## 2023-04-18 MED ORDER — PHENYLEPHRINE 80 MCG/ML (10ML) SYRINGE FOR IV PUSH (FOR BLOOD PRESSURE SUPPORT)
PREFILLED_SYRINGE | INTRAVENOUS | Status: AC
  Filled 2023-04-18: qty 10

## 2023-04-18 MED ORDER — PANTOPRAZOLE SODIUM 40 MG PO TBEC
40.0000 mg | DELAYED_RELEASE_TABLET | Freq: Every day | ORAL | Status: DC
  Administered 2023-04-18: 40 mg via ORAL
  Filled 2023-04-18: qty 1

## 2023-04-18 MED ORDER — ONDANSETRON HCL 4 MG/2ML IJ SOLN
4.0000 mg | Freq: Four times a day (QID) | INTRAMUSCULAR | Status: DC | PRN
  Administered 2023-04-19 (×2): 4 mg via INTRAVENOUS
  Filled 2023-04-18 (×2): qty 2

## 2023-04-18 MED ORDER — ONDANSETRON HCL 4 MG/2ML IJ SOLN
INTRAMUSCULAR | Status: AC
  Filled 2023-04-18: qty 2

## 2023-04-18 MED ORDER — EPHEDRINE SULFATE-NACL 50-0.9 MG/10ML-% IV SOSY
PREFILLED_SYRINGE | INTRAVENOUS | Status: DC | PRN
  Administered 2023-04-18 (×3): 5 mg via INTRAVENOUS

## 2023-04-18 MED ORDER — TRANEXAMIC ACID-NACL 1000-0.7 MG/100ML-% IV SOLN
1000.0000 mg | Freq: Once | INTRAVENOUS | Status: AC
  Administered 2023-04-18: 1000 mg via INTRAVENOUS
  Filled 2023-04-18: qty 100

## 2023-04-18 MED ORDER — ONDANSETRON HCL 4 MG/2ML IJ SOLN
INTRAMUSCULAR | Status: DC | PRN
  Administered 2023-04-18: 4 mg via INTRAVENOUS

## 2023-04-18 MED ORDER — MIDAZOLAM HCL 2 MG/2ML IJ SOLN
INTRAMUSCULAR | Status: DC | PRN
  Administered 2023-04-18: 2 mg via INTRAVENOUS

## 2023-04-18 MED ORDER — KETOROLAC TROMETHAMINE 30 MG/ML IJ SOLN
INTRAMUSCULAR | Status: DC | PRN
  Administered 2023-04-18: 30 mg

## 2023-04-18 MED ORDER — SODIUM CHLORIDE (PF) 0.9 % IJ SOLN
INTRAMUSCULAR | Status: AC
  Filled 2023-04-18: qty 50

## 2023-04-18 MED ORDER — BISACODYL 10 MG RE SUPP: 10.0000 mg | Freq: Every day | RECTAL | Status: DC | PRN

## 2023-04-18 MED ORDER — EPHEDRINE 5 MG/ML INJ
INTRAVENOUS | Status: AC
  Filled 2023-04-18: qty 5

## 2023-04-18 MED ORDER — CLOPIDOGREL BISULFATE 75 MG PO TABS
75.0000 mg | ORAL_TABLET | Freq: Every day | ORAL | Status: DC
  Administered 2023-04-19: 75 mg via ORAL
  Filled 2023-04-18: qty 1

## 2023-04-18 MED ORDER — OXYCODONE HCL 5 MG PO TABS
5.0000 mg | ORAL_TABLET | ORAL | Status: DC | PRN
  Administered 2023-04-18: 10 mg via ORAL

## 2023-04-18 MED ORDER — CEFAZOLIN SODIUM-DEXTROSE 2-4 GM/100ML-% IV SOLN
2.0000 g | INTRAVENOUS | Status: AC
  Administered 2023-04-18: 2 g via INTRAVENOUS
  Filled 2023-04-18: qty 100

## 2023-04-18 MED ORDER — MENTHOL 3 MG MT LOZG: 1.0000 | LOZENGE | OROMUCOSAL | Status: DC | PRN

## 2023-04-18 MED ORDER — 0.9 % SODIUM CHLORIDE (POUR BTL) OPTIME
TOPICAL | Status: DC | PRN
  Administered 2023-04-18: 1000 mL

## 2023-04-18 MED ORDER — PROPOFOL 500 MG/50ML IV EMUL
INTRAVENOUS | Status: AC
  Filled 2023-04-18: qty 50

## 2023-04-18 MED ORDER — TIZANIDINE HCL 4 MG PO TABS
2.0000 mg | ORAL_TABLET | Freq: Three times a day (TID) | ORAL | Status: DC | PRN
  Administered 2023-04-19: 4 mg via ORAL
  Filled 2023-04-18: qty 1

## 2023-04-18 MED ORDER — SENNA 8.6 MG PO TABS
2.0000 | ORAL_TABLET | Freq: Every day | ORAL | Status: DC
  Administered 2023-04-18: 17.2 mg via ORAL
  Filled 2023-04-18: qty 2

## 2023-04-18 MED ORDER — ORAL CARE MOUTH RINSE: 15.0000 mL | Freq: Once | OROMUCOSAL | Status: AC

## 2023-04-18 MED ORDER — CEFAZOLIN SODIUM-DEXTROSE 2-4 GM/100ML-% IV SOLN
2.0000 g | Freq: Four times a day (QID) | INTRAVENOUS | Status: AC
  Administered 2023-04-18 (×2): 2 g via INTRAVENOUS
  Filled 2023-04-18 (×2): qty 100

## 2023-04-18 MED ORDER — ACETAMINOPHEN 500 MG PO TABS
500.0000 mg | ORAL_TABLET | Freq: Four times a day (QID) | ORAL | Status: DC
  Administered 2023-04-18 – 2023-04-19 (×4): 500 mg via ORAL
  Filled 2023-04-18 (×4): qty 1

## 2023-04-18 MED ORDER — HYDROMORPHONE HCL 1 MG/ML IJ SOLN: 0.2500 mg | INTRAMUSCULAR | Status: DC | PRN

## 2023-04-18 MED ORDER — POLYETHYLENE GLYCOL 3350 17 G PO PACK
17.0000 g | PACK | Freq: Two times a day (BID) | ORAL | Status: DC
  Administered 2023-04-18 – 2023-04-19 (×2): 17 g via ORAL
  Filled 2023-04-18 (×2): qty 1

## 2023-04-18 MED ORDER — METOCLOPRAMIDE HCL 5 MG/ML IJ SOLN: 5.0000 mg | Freq: Three times a day (TID) | INTRAMUSCULAR | Status: DC | PRN

## 2023-04-18 MED ORDER — MOMETASONE FURO-FORMOTEROL FUM 200-5 MCG/ACT IN AERO
2.0000 | INHALATION_SPRAY | Freq: Two times a day (BID) | RESPIRATORY_TRACT | Status: DC | PRN
  Filled 2023-04-18: qty 8.8

## 2023-04-18 MED ORDER — TRANEXAMIC ACID-NACL 1000-0.7 MG/100ML-% IV SOLN
1000.0000 mg | INTRAVENOUS | Status: AC
  Administered 2023-04-18: 1000 mg via INTRAVENOUS
  Filled 2023-04-18: qty 100

## 2023-04-18 MED ORDER — PROPOFOL 500 MG/50ML IV EMUL
INTRAVENOUS | Status: DC | PRN
  Administered 2023-04-18: 125 ug/kg/min via INTRAVENOUS

## 2023-04-18 MED ORDER — FENTANYL CITRATE (PF) 100 MCG/2ML IJ SOLN
INTRAMUSCULAR | Status: AC
  Filled 2023-04-18: qty 2

## 2023-04-18 MED ORDER — OXYCODONE HCL 5 MG/5ML PO SOLN: 5.0000 mg | Freq: Once | ORAL | Status: DC | PRN

## 2023-04-18 MED ORDER — DEXAMETHASONE SODIUM PHOSPHATE 10 MG/ML IJ SOLN
8.0000 mg | Freq: Once | INTRAMUSCULAR | Status: AC
  Administered 2023-04-18: 8 mg via INTRAVENOUS

## 2023-04-18 MED ORDER — NICOTINE 14 MG/24HR TD PT24
14.0000 mg | MEDICATED_PATCH | Freq: Every day | TRANSDERMAL | Status: DC | PRN
  Administered 2023-04-18: 14 mg via TRANSDERMAL
  Filled 2023-04-18: qty 1

## 2023-04-18 MED ORDER — PHENYLEPHRINE 80 MCG/ML (10ML) SYRINGE FOR IV PUSH (FOR BLOOD PRESSURE SUPPORT)
PREFILLED_SYRINGE | INTRAVENOUS | Status: DC | PRN
  Administered 2023-04-18: 80 ug via INTRAVENOUS
  Administered 2023-04-18: 160 ug via INTRAVENOUS
  Administered 2023-04-18 (×2): 80 ug via INTRAVENOUS
  Administered 2023-04-18 (×2): 160 ug via INTRAVENOUS

## 2023-04-18 MED ORDER — CHLORHEXIDINE GLUCONATE 0.12 % MT SOLN
15.0000 mL | Freq: Once | OROMUCOSAL | Status: AC
  Administered 2023-04-18: 15 mL via OROMUCOSAL

## 2023-04-18 MED ORDER — ROSUVASTATIN CALCIUM 10 MG PO TABS
10.0000 mg | ORAL_TABLET | Freq: Every day | ORAL | Status: DC
  Administered 2023-04-19: 10 mg via ORAL
  Filled 2023-04-18: qty 1

## 2023-04-18 MED ORDER — ONDANSETRON HCL 4 MG/2ML IJ SOLN: 4.0000 mg | Freq: Once | INTRAMUSCULAR | Status: DC | PRN

## 2023-04-18 MED ORDER — METOCLOPRAMIDE HCL 5 MG PO TABS: 5.0000 mg | ORAL_TABLET | Freq: Three times a day (TID) | ORAL | Status: DC | PRN

## 2023-04-18 MED ORDER — PROPOFOL 10 MG/ML IV BOLUS
INTRAVENOUS | Status: AC
  Filled 2023-04-18: qty 20

## 2023-04-18 MED ORDER — HYDROXYZINE HCL 25 MG PO TABS: 25.0000 mg | ORAL_TABLET | Freq: Three times a day (TID) | ORAL | Status: DC | PRN

## 2023-04-18 MED ORDER — OXYCODONE HCL 5 MG PO TABS
10.0000 mg | ORAL_TABLET | ORAL | Status: DC | PRN
  Administered 2023-04-18 – 2023-04-19 (×4): 15 mg via ORAL
  Filled 2023-04-18 (×2): qty 3
  Filled 2023-04-18: qty 2
  Filled 2023-04-18 (×2): qty 3

## 2023-04-18 MED ORDER — SODIUM CHLORIDE 0.9 % IV SOLN: INTRAVENOUS | Status: DC

## 2023-04-18 MED ORDER — HYDROMORPHONE HCL 1 MG/ML IJ SOLN
0.5000 mg | INTRAMUSCULAR | Status: DC | PRN
  Administered 2023-04-18 – 2023-04-19 (×3): 1 mg via INTRAVENOUS
  Filled 2023-04-18 (×3): qty 1

## 2023-04-18 MED ORDER — METHOCARBAMOL 500 MG IVPB - SIMPLE MED
INTRAVENOUS | Status: AC
  Administered 2023-04-18: 500 mg via INTRAVENOUS
  Filled 2023-04-18: qty 55

## 2023-04-18 MED ORDER — FENTANYL CITRATE (PF) 100 MCG/2ML IJ SOLN
INTRAMUSCULAR | Status: DC | PRN
  Administered 2023-04-18: 100 ug via INTRAVENOUS

## 2023-04-18 MED ORDER — ALUM & MAG HYDROXIDE-SIMETH 200-200-20 MG/5ML PO SUSP: 30.0000 mL | ORAL | Status: DC | PRN

## 2023-04-18 MED ORDER — BUPIVACAINE-EPINEPHRINE (PF) 0.25% -1:200000 IJ SOLN
INTRAMUSCULAR | Status: DC | PRN
  Administered 2023-04-18: 30 mL via PERINEURAL

## 2023-04-18 MED ORDER — SODIUM CHLORIDE (PF) 0.9 % IJ SOLN
INTRAMUSCULAR | Status: DC | PRN
  Administered 2023-04-18: 30 mL

## 2023-04-18 MED ORDER — ONDANSETRON HCL 4 MG PO TABS: 4.0000 mg | ORAL_TABLET | Freq: Four times a day (QID) | ORAL | Status: DC | PRN

## 2023-04-18 MED ORDER — DEXAMETHASONE SODIUM PHOSPHATE 10 MG/ML IJ SOLN
INTRAMUSCULAR | Status: AC
  Filled 2023-04-18: qty 1

## 2023-04-18 MED ORDER — ALBUTEROL SULFATE (2.5 MG/3ML) 0.083% IN NEBU: 3.0000 mL | INHALATION_SOLUTION | Freq: Four times a day (QID) | RESPIRATORY_TRACT | Status: DC | PRN

## 2023-04-18 SURGICAL SUPPLY — 46 items
ADH SKN CLS APL DERMABOND .7 (GAUZE/BANDAGES/DRESSINGS) ×1
BAG COUNTER SPONGE SURGICOUNT (BAG) IMPLANT
BAG SPEC THK2 15X12 ZIP CLS (MISCELLANEOUS)
BAG SPNG CNTER NS LX DISP (BAG) ×1
BAG ZIPLOCK 12X15 (MISCELLANEOUS) IMPLANT
BALL HIP CERAMIC (Hips) IMPLANT
BLADE SAG 18X100X1.27 (BLADE) ×2 IMPLANT
COVER PERINEAL POST (MISCELLANEOUS) ×2 IMPLANT
COVER SURGICAL LIGHT HANDLE (MISCELLANEOUS) ×2 IMPLANT
CUP ACET PINNACLE SECTR 50MM (Hips) IMPLANT
DERMABOND ADVANCED .7 DNX12 (GAUZE/BANDAGES/DRESSINGS) ×2 IMPLANT
DRAPE FOOT SWITCH (DRAPES) ×2 IMPLANT
DRAPE STERI IOBAN 125X83 (DRAPES) ×2 IMPLANT
DRAPE U-SHAPE 47X51 STRL (DRAPES) ×4 IMPLANT
DRESSING AQUACEL AG SP 3.5X10 (GAUZE/BANDAGES/DRESSINGS) ×2 IMPLANT
DRSG AQUACEL AG ADV 3.5X10 (GAUZE/BANDAGES/DRESSINGS) IMPLANT
DRSG AQUACEL AG SP 3.5X10 (GAUZE/BANDAGES/DRESSINGS) ×1
DURAPREP 26ML APPLICATOR (WOUND CARE) ×2 IMPLANT
ELECT REM PT RETURN 15FT ADLT (MISCELLANEOUS) ×2 IMPLANT
GLOVE BIO SURGEON STRL SZ 6 (GLOVE) ×2 IMPLANT
GLOVE BIOGEL PI IND STRL 6.5 (GLOVE) ×2 IMPLANT
GLOVE BIOGEL PI IND STRL 7.5 (GLOVE) ×2 IMPLANT
GLOVE ORTHO TXT STRL SZ7.5 (GLOVE) ×4 IMPLANT
GOWN STRL REUS W/ TWL LRG LVL3 (GOWN DISPOSABLE) ×4 IMPLANT
GOWN STRL REUS W/TWL LRG LVL3 (GOWN DISPOSABLE) ×2
HIP BALL CERAMIC (Hips) ×1 IMPLANT
HOLDER FOLEY CATH W/STRAP (MISCELLANEOUS) ×2 IMPLANT
KIT TURNOVER KIT A (KITS) IMPLANT
LINER ACET PNNCL PLUS4 NEUTRAL (Hips) IMPLANT
NDL SAFETY ECLIP 18X1.5 (MISCELLANEOUS) IMPLANT
PACK ANTERIOR HIP CUSTOM (KITS) ×2 IMPLANT
PINNACLE PLUS 4 NEUTRAL (Hips) ×1 IMPLANT
PINNACLE SECTOR CUP 50MM (Hips) ×1 IMPLANT
SCREW 6.5MMX30MM (Screw) IMPLANT
STEM FEMORAL SZ6 HIGH ACTIS (Stem) IMPLANT
SUT MNCRL AB 4-0 PS2 18 (SUTURE) ×2 IMPLANT
SUT STRATAFIX 0 PDS 27 VIOLET (SUTURE) ×1
SUT VIC AB 1 CT1 36 (SUTURE) ×6 IMPLANT
SUT VIC AB 2-0 CT1 27 (SUTURE) ×2
SUT VIC AB 2-0 CT1 TAPERPNT 27 (SUTURE) ×4 IMPLANT
SUTURE STRATFX 0 PDS 27 VIOLET (SUTURE) ×2 IMPLANT
SYR 3ML LL SCALE MARK (SYRINGE) IMPLANT
TRAY FOLEY MTR SLVR 14FR STAT (SET/KITS/TRAYS/PACK) IMPLANT
TRAY FOLEY MTR SLVR 16FR STAT (SET/KITS/TRAYS/PACK) IMPLANT
TUBE SUCTION HIGH CAP CLEAR NV (SUCTIONS) ×2 IMPLANT
WATER STERILE IRR 1000ML POUR (IV SOLUTION) ×2 IMPLANT

## 2023-04-18 NOTE — Evaluation (Signed)
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 Marland Kitchen Pt PMH includes but is not limited to: CAD, pancreatitis, HTN, tobacco abuse, GERD, PTSD, cirrhosis, cervical ca, and COPD.  Clinical Impression     Felicia Perkins is a 47 y.o. female  POD 0 s/p L THA. Patient reports IND with mobility at baseline. Patient is now limited by functional impairments (see PT problem list below) and requires CGA for bed mobility and CGA and cues for transfers. Patient was able to ambulate 60 feet with RW and CGA level of assist. Patient instructed in exercise to facilitate ROM and circulation to manage edema.  Patient will benefit from continued skilled PT interventions to address impairments and progress towards PLOF. Acute PT will follow to progress mobility and stair training in preparation for safe discharge home with family support and HEP.       If plan is discharge home, recommend the following: A little help with walking and/or transfers;A little help with bathing/dressing/bathroom;Assistance with cooking/housework;Assist for transportation;Help with stairs or ramp for entrance   Can travel by private vehicle        Equipment Recommendations None recommended by PT  Recommendations for Other Services       Functional Status Assessment Patient has had a recent decline in their functional status and demonstrates the ability to make significant improvements in function in a reasonable and predictable amount of time.     Precautions / Restrictions Precautions Precautions: Fall Restrictions Weight Bearing Restrictions: No      Mobility  Bed Mobility Overal bed  mobility: Needs Assistance Bed Mobility: Supine to Sit     Supine to sit: Contact guard, Used rails, HOB elevated     General bed mobility comments: increased time and cues    Transfers Overall transfer level: Needs assistance Equipment used: Rolling walker (2 wheels) Transfers: Sit to/from Stand Sit to Stand: Contact guard assist, From elevated surface           General transfer comment: min cues for technique    Ambulation/Gait Ambulation/Gait assistance: Contact guard assist Gait Distance (Feet): 60 Feet Assistive device: Rolling walker (2 wheels) Gait Pattern/deviations: Step-to pattern, Antalgic Gait velocity: decreased     General Gait Details: step almost through pattern, RW low per pt preferance  Stairs            Wheelchair Mobility     Tilt Bed    Modified Rankin (Stroke Patients Only)       Balance Overall balance assessment: Needs assistance Sitting-balance support: Feet supported Sitting balance-Leahy Scale: Good     Standing balance support: Bilateral upper extremity supported, During functional activity, Reliant on assistive device for balance Standing balance-Leahy Scale: Fair Standing balance comment: pt is able to maintain static standing balance without UE support                             Pertinent Vitals/Pain Pain Assessment Pain Assessment: 0-10 Pain Score: 5  Pain Location: L hip and leg Pain Descriptors / Indicators: Aching, Constant, Discomfort, Operative site guarding, Throbbing Pain Intervention(s): Limited activity within patient's tolerance, Monitored during session, Premedicated before session, Repositioned, Ice applied  Home Living Family/patient expects to be discharged to:: Private residence Living Arrangements: Spouse/significant other;Children Available Help at Discharge: Family;Available 24 hours/day Type of Home: House Home Access: Level entry       Home Layout: Two level;Able to live on  main level with bedroom/bathroom Home Equipment: Crutches;Toilet riser;Standard Environmental consultant      Prior Function Prior Level of Function : Independent/Modified Independent;Working/employed             Mobility Comments: IND with all ADLs, self care tasks, IADLs. No AD       Extremity/Trunk Assessment        Lower Extremity Assessment Lower Extremity Assessment: LLE deficits/detail LLE Deficits / Details: ankle DF/PF 5/5 LLE Sensation: WNL    Cervical / Trunk Assessment Cervical / Trunk Assessment: Normal  Communication   Communication Communication: No apparent difficulties  Cognition Arousal: Alert Behavior During Therapy: WFL for tasks assessed/performed Overall Cognitive Status: Within Functional Limits for tasks assessed                                          General Comments      Exercises Total Joint Exercises Ankle Circles/Pumps: AROM, Both, 10 reps   Assessment/Plan    PT Assessment Patient needs continued PT services  PT Problem List Decreased strength;Decreased range of motion;Decreased activity tolerance;Decreased balance;Decreased mobility;Decreased coordination;Pain       PT Treatment Interventions DME instruction;Gait training;Stair training;Functional mobility training;Therapeutic activities;Therapeutic exercise;Balance training;Neuromuscular re-education;Modalities;Patient/family education    PT Goals (Current goals can be found in the Care Plan section)  Acute Rehab PT Goals Patient Stated Goal: to be able to return to work PT Goal Formulation: With patient Time For Goal Achievement: 05/02/23 Potential to Achieve Goals: Good    Frequency 7X/week     Co-evaluation               AM-PAC PT "6 Clicks" Mobility  Outcome Measure Help needed turning from your back to your side while in a flat bed without using bedrails?: A Little Help needed moving from lying on your back to sitting on the side of a flat bed without  using bedrails?: A Little Help needed moving to and from a bed to a chair (including a wheelchair)?: A Little Help needed standing up from a chair using your arms (e.g., wheelchair or bedside chair)?: A Little Help needed to walk in hospital room?: A Little Help needed climbing 3-5 steps with a railing? : A Lot 6 Click Score: 17    End of Session Equipment Utilized During Treatment: Gait belt Activity Tolerance: Patient tolerated treatment well Patient left: in chair;with call bell/phone within reach;with chair alarm set Nurse Communication: Mobility status PT Visit Diagnosis: Unsteadiness on feet (R26.81);Other abnormalities of gait and mobility (R26.89);Muscle weakness (generalized) (M62.81);Difficulty in walking, not elsewhere classified (R26.2);Pain Pain - Right/Left: Left Pain - part of body: Hip;Leg    Time: 6578-4696 PT Time Calculation (min) (ACUTE ONLY): 24 min   Charges:   PT Evaluation $PT Eval Low Complexity: 1 Low PT Treatments $Gait Training: 8-22 mins PT General Charges $$ ACUTE PT VISIT: 1 Visit         Johnny Bridge, PT Acute Rehab   Jacqualyn Posey 04/18/2023, 6:54 PM

## 2023-04-18 NOTE — H&P (Signed)
TOTAL HIP ADMISSION H&P  Patient is admitted for left total hip arthroplasty.  Therapy Plans: HEP Disposition: Home with [place] Planned DVT Prophylaxis: Plavix DME needed: none PCP: (appointment on 9/3) Cardio: Dr. Barnetta Hammersmith 8/29 - (hx of carotid stenosis) TXA: IV Allergies: NKDA Anesthesia Concerns: none BMI: 19.2 Last HgbA1c: Not diabetic   Other: - tizanidine, oxycodone, tylenol, meloxicam - No hx of VTE or cancer   Subjective:  Chief Complaint: left hip pain  HPI: Felicia Perkins, 47 y.o. female. She has a history of left femoral neck fracture which was treated with percutaneous screw fixation on 06/05/22. 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 We discussed hardware removal and conversion to total hip arthroplasty.   Patient Active Problem List   Diagnosis Date Noted   Protein-calorie malnutrition, severe 03/13/2023   Lactic acidosis 03/13/2023   Alcoholic pancreatitis 03/11/2023   Acute on chronic pancreatitis (HCC) 03/10/2023   Intractable nausea and vomiting 10/30/2022   Malnutrition of moderate degree 06/05/2022   Essential (primary) hypertension 03/16/2021   Smoker 03/16/2021   History of carotid endarterectomy 03/16/2021   Coronary artery disease 03/02/2021   Depression 03/02/2021   Carotid artery stenosis 01/26/2021   Anxiety    Cancer (HCC)    Cirrhosis (HCC)    GERD (gastroesophageal reflux disease)    PTSD (post-traumatic stress disorder)    Vascular disease    Bipolar 1 disorder (HCC) 01/13/2019   Eczema 10/09/2018   Acute pancreatitis    Tobacco abuse counseling    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   Alcoholic cirrhosis of liver without ascites (HCC) 10/15/2016   Alcohol abuse 09/04/2016   Tobacco abuse 09/04/2016   COPD (chronic obstructive pulmonary disease) (HCC) 09/04/2016   Alcohol dependence (HCC) 12/16/2012    Past Medical History:  Diagnosis Date   Acute on chronic pancreatitis (HCC) 03/22/2019   Acute pancreatitis    Alcohol abuse    Alcohol dependence (HCC) 12/16/2012   Medical detox successful    Alcohol induced acute pancreatitis 11/22/2017   Alcoholic cirrhosis of liver without ascites (HCC) 10/15/2016   Anemia    Anxiety    Aortic atherosclerosis (HCC)    Bipolar 1 disorder (HCC)    Cancer (HCC)    cervical cancer   Chest pain with moderate risk for cardiac etiology 12/15/2014   Cirrhosis (HCC)    COPD (chronic obstructive pulmonary disease) (HCC) 09/04/2016   Coronary artery disease    Depression    Duodenitis 11/22/2017   Eczema 10/09/2018   Elevated liver enzymes 12/22/2014   Fracture of femoral neck, left (HCC) 06/04/2022   Gastritis and gastroduodenitis 09/04/2016   GERD (gastroesophageal reflux disease)    Hip fracture (HCC) 06/03/2022   Nondisplaced fracture of neck of fifth metacarpal bone, right hand, initial encounter for closed fracture 08/26/2017   Pancreatitis    Peripheral vascular disease (HCC)    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 Early 20's. Normal   ENDARTERECTOMY Right 01/26/2021   Procedure: RIGHT  CAROTID ARTERY ENDARTERECTOMY;  Surgeon: Maeola Harman, MD;  Location: [facility] OR;  Service: Vascular;  Laterality: Right;   ESOPHAGOGASTRODUODENOSCOPY (EGD) WITH PROPOFOL N/A 01/02/2018   Procedure: ESOPHAGOGASTRODUODENOSCOPY (EGD) WITH PROPOFOL;  Surgeon: [name], MD;  Location: [facility];  Service: Endoscopy;  Laterality: N/A;   EUS N/A 01/02/2018   Procedure: UPPER  ENDOSCOPIC ULTRASOUND (EUS) RADIAL;  Surgeon: [name], MD;  Location: [facility];  Service: Endoscopy;  Laterality: N/A;   FOOT SURGERY     HIP PINNING,CANNULATED Left 06/05/2022   Procedure: PERCUTANEOUS FIXATION OF FEMORAL NECK;  Surgeon: Durene Romans, MD;  Location: [facility] OR;  Service: Orthopedics;  Laterality: Left;   OTHER SURGICAL HISTORY     ovarian surgery   TUBAL LIGATION      Current Facility-Administered Medications  Medication Dose Route Frequency Provider Last Rate Last Admin   ceFAZolin (ANCEF) IVPB 2g/100 mL premix  2 g Intravenous On Call to OR Cassandria Anger, PA-C       chlorhexidine (PERIDEX) 0.12 % solution 15 mL  15 mL Mouth/Throat Once Stoltzfus, Nelle Don, DO       Or   Oral care mouth rinse  15 mL Mouth Rinse Once Stoltzfus, Gregory P, DO       dexamethasone (DECADRON) injection 8 mg  8 mg Intravenous Once Cassandria Anger, PA-C       lactated ringers infusion   Intravenous Continuous Rosalene Billings R, PA-C       lactated ringers infusion   Intravenous Continuous Stoltzfus, Gregory P, DO       povidone-iodine 10 % swab 2 Application  2 Application Topical Once Cassandria Anger, PA-C       tranexamic acid (CYKLOKAPRON) IVPB 1,000 mg  1,000 mg Intravenous To OR Cassandria Anger, PA-C       No Known Allergies  Social History   Tobacco Use   Smoking status: Every Day    Current packs/day: 0.25    Types: Cigarettes   Smokeless tobacco: Never   Tobacco comments:    5-6     cigarettes daily  Substance Use Topics   Alcohol use: Not Currently    Comment: 2 glasses    Family History  Problem Relation Age of Onset   Hypertension Mother    Breast cancer Mother 20   Pancreatitis Mother    Hypertension Father    Heart attack Cousin 30       died in 44s of MI   Breast cancer Maternal Aunt    Breast cancer Maternal Aunt    Colon cancer Neg Hx    Esophageal cancer Neg Hx    Rectal cancer Neg Hx    Stomach cancer Neg Hx      Review of Systems   Constitutional:  Negative for chills and fever.  Respiratory:  Negative for cough and shortness of breath.   Cardiovascular:  Negative for chest pain.  Gastrointestinal:  Negative for nausea and vomiting.  Musculoskeletal:  Positive for arthralgias.     Objective:  Physical Exam Well nourished and well developed. General: Alert and oriented x3, cooperative and pleasant, no acute distress. Head: normocephalic, atraumatic, neck supple. Eyes: EOMI.  Musculoskeletal: Left Hip: Very limited passive or active hip ROM with significant pain   Calves soft and nontender. Motor function intact in LE. Strength 5/5 LE bilaterally. Neuro: Distal pulses 2+. Sensation to light touch intact in LE.   Vital signs in last 24 hours: Temp:  [98.1 F (  36.7 C)] 98.1 F (36.7 C) (09/05 0653) Pulse Rate:  [68] 68 (09/05 0653) Resp:  [16] 16 (09/05 0653) BP: (129)/(83) 129/83 (09/05 0653) SpO2:  [100 %] 100 % (09/05 0653)  Labs:   Estimated body mass index is 18.68 kg/m as calculated from the following:   Height as of 04/16/23: 5\' 4"  (1.626 m).   Weight as of 04/16/23: 49.4 kg.   Imaging Review Plain radiographs demonstrate avascular necrosis of the femoral head without failure of the hardware.       Assessment/Plan:  Avascular necrosis, left hip(s)  The patient history, physical examination, clinical judgement of the provider and imaging studies are consistent with end stage degenerative joint disease of the left hip(s) and total hip arthroplasty is deemed medically necessary. The treatment options including medical management, injection therapy, arthroscopy and arthroplasty were discussed at length. The risks and benefits of total hip arthroplasty were presented and reviewed. The risks due to aseptic loosening, infection, stiffness, dislocation/subluxation,  thromboembolic complications and other imponderables were discussed.  The patient acknowledged the explanation, agreed to proceed  with the plan and consent was signed. Patient is being admitted for inpatient treatment for surgery, pain control, PT, OT, prophylactic antibiotics, VTE prophylaxis, progressive ambulation and ADL's and discharge planning.The patient is planning to be discharged  home.   Rosalene Billings, PA-C Orthopedic Surgery [facility] [place] [phone]

## 2023-04-18 NOTE — Anesthesia Procedure Notes (Signed)
Procedure Name: MAC Date/Time: 04/18/2023 8:50 AM  Performed by: Elyn Peers, CRNAPre-anesthesia Checklist: Patient identified, Emergency Drugs available, Suction available, Patient being monitored and Timeout performed Oxygen Delivery Method: Simple face mask Placement Confirmation: positive ETCO2

## 2023-04-18 NOTE — Op Note (Signed)
NAME: Felicia Perkins, BRENDEL MEDICAL RECORD NO: [id number] ACCOUNT NO: [id number] DATE OF BIRTH: 02/13/1976 FACILITY: [facility] LOCATION: [facility] PHYSICIAN: Madlyn Frankel. 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  PROCEDURE:  Conversion of failed left hip surgery to left total hip arthroplasty.  COMPONENTS USED:  DePuy hip system with a size 50 mm Pinnacle shell, 32+4 neutral AltrX liner with a size 6 high ACTIS stem with a 32+5 ball.  SURGEON:  Madlyn Frankel. Charlann Boxer, MD  ASSISTANT:  Rosalene Billings, PA-C.  Note that Ms. [name] was present for the entirety of the case from preoperative positioning, perioperative management of the operative extremity, general facilitation of the case and primary wound closure.  ANESTHESIA:  Spinal.  BLOOD LOSS:  About 350 mL  DRAINS:  None.  COMPLICATIONS:  None.  INDICATIONS FOR PROCEDURE:  The patient is a very pleasant 47 year old female who unfortunately had a ground level fall resulted in a left femoral neck fracture.  She was treated with percutaneous screws in attempt for joint preservation.  She had been  seen routinely and regularly in the office for followup radiographs indicating healing of her fracture site, but the subsequent development of avascular necrosis of her femoral head.  She had significant pain and dysfunction resulting in a limp and pain  that was affecting her quality of life and functional ability.  Based on these radiographic findings, we discussed converting to a total hip arthroplasty.  Risks of infection, DVT, instability, neurovascular injury and the need for  future surgeries were  discussed and reviewed particularly as it pertained to her age.  Consent was obtained for the benefit of pain relief.  DESCRIPTION OF PROCEDURE:  The patient was brought to the operative theater.  Once adequate anesthesia, preoperative antibiotics, Ancef administered, and 1 gram of tranexamic acid, she was positioned supine on the Hana table.  All bony prominences were  carefully padded.  Fluoroscopy was brought in initially just to confirm orientation of the pelvis.  The left hip was then prepped and draped in sterile fashion.  A timeout was performed identifying the patient, planned procedure, and extremity.  Once  this was done, I first exposed the hip through a standard anterior approach.  The capsule was opened encountering clear synovial fluid without signs of infection.  Once I had opened the capsule, I then addressed the removal of hardware through her old  incision, which was excised.  The iliotibial band was exposed and then incised.  I was able to identify the screw heads of the 3 screws previously placed.  They were all removed without complication.  Following this, we returned our attention to the hip.   Retractors were placed around the femoral neck.  Traction was applied and a femoral neck osteotomy made at the base of the trochanteric fossa.  The femoral head was removed and noted to have findings consistent with avascular necrosis with collapse of  femoral head and deformity of her articular cartilage.  The acetabulum was exposed and retractors were placed.  Following soft tissue debridement, I began reaming with a 46 reamer and reamed up to 49 mm.  A 50 mm Pinnacle shell was selected and impacted  under fluoroscopic imaging and guidance.  A single cancellous screw was placed into the ilium and the final 32+4 neutral AltrX liner was impacted.  At this point, attention was directed to the femur.  The lateral hook was placed underneath the femur and the femur rotated  to 100 degrees and elevated.  It was held in position as the leg was extended and adducted.  I began with a box osteotome and then  broached up to a size 6 broach.  We did a trial reduction and selected the high offset neck with a 32+5 ball.  This combination seemed to restore her leg lengths from a significant shortening from her avascular necrosis and femoral neck fracture.  Based  on this, the final 6 high ACTIS stem was opened.  The trial components were removed and the canal irrigated.  The final 6 high stem was impacted and sat at about the level where the broach was. We retrialed with a +5 ball.  Again, her leg lengths and  offset appeared to be near equal to that of the contralateral hip.  Based on this, the final 32+5 delta ceramic ball was opened and impacted onto a clean and dry trunnion.  The hip was reduced.  Following irrigation, the capsule of the hip joint was  reapproximated using #1 Vicryl.  The tensor fascia was reapproximated using #1 Vicryl and #1 Stratafix suture.  We injected the soft tissues as well as the deep tissues with Marcaine, Toradol and saline.  The lateral incision made to remove the hardware  was closed in layers as well, all the remaining wounds were closed with 2-0 Vicryl and a running Monocryl stitch.  The wounds were cleaned, dried and dressed sterilely using surgical glue and an Aquacel dressing.  The patient was then brought to the  recovery room in stable condition, tolerating the procedure well.  Findings reviewed with the family.  Postoperatively, she will be weightbearing as tolerated with anticipated discharge within 1-2 days.   PAA D: 04/18/2023 10:45:21 am T: 04/18/2023 11:07:00 am  JOB: [id number]/ [id number]

## 2023-04-18 NOTE — Transfer of Care (Signed)
Immediate Anesthesia Transfer of Care Note  Patient: Felicia Perkins  Procedure(s) Performed: TOTAL HIP ARTHROPLASTY ANTERIOR APPROACH (Left: Hip)  Patient Location: PACU  Anesthesia Type:Spinal  Level of Consciousness: awake, alert , and oriented  Airway & Oxygen Therapy: Patient Spontanous Breathing and Patient connected to face mask oxygen  Post-op Assessment: Report given to RN and Post -op Vital signs reviewed and stable  Post vital signs: Reviewed and stable  Last Vitals:  Vitals Value Taken Time  BP 111/75 04/18/23 1049  Temp    Pulse 60 04/18/23 1051  Resp 15 04/18/23 1051  SpO2 100 % 04/18/23 1051  Vitals shown include unfiled device data.  Last Pain:  Vitals:   04/18/23 0722  TempSrc:   PainSc: 0-No pain         Complications: No notable events documented.

## 2023-04-18 NOTE — Anesthesia Procedure Notes (Signed)
Spinal  Patient location during procedure: OR Start time: 04/18/2023 8:59 AM End time: 04/18/2023 9:04 AM Reason for block: surgical anesthesia Staffing Performed: resident/CRNA  Resident/CRNA: Elyn Peers, CRNA Performed by: Elyn Peers, CRNA Authorized by: Mal Amabile, MD   Preanesthetic Checklist Completed: patient identified, IV checked, site marked, risks and benefits discussed, surgical consent, monitors and equipment checked, pre-op evaluation and timeout performed Spinal Block Patient position: sitting Prep: DuraPrep and site prepped and draped Patient monitoring: heart rate, continuous pulse ox, blood pressure and cardiac monitor Approach: midline Location: L3-4 Injection technique: single-shot Needle Needle type: Pencan  Needle gauge: 24 G Needle length: 10 cm Assessment Events: CSF return Additional Notes Expiration date on kit noted and within range.  Sterile prep and drape.    Local with Lidocaine 1%.  Good CSF flow noted with no heme or c/o paresthesia.   Patient tolerated well.

## 2023-04-18 NOTE — Anesthesia Postprocedure Evaluation (Addendum)
Anesthesia Post Note  Patient: Felicia Perkins  Procedure(s) Performed: TOTAL HIP ARTHROPLASTY ANTERIOR APPROACH (Left: Hip)     Patient location during evaluation: PACU Anesthesia Type: Spinal Level of consciousness: oriented and awake and alert Pain management: pain level controlled Vital Signs Assessment: post-procedure vital signs reviewed and stable Respiratory status: spontaneous breathing, respiratory function stable and nonlabored ventilation Cardiovascular status: blood pressure returned to baseline and stable Postop Assessment: no headache, no backache, no apparent nausea or vomiting, patient able to bend at knees and spinal receding Anesthetic complications: no   No notable events documented.  Last Vitals:  Vitals:   04/18/23 1100 04/18/23 1115  BP: 114/66 100/68  Pulse: (!) 56 (!) 57  Resp: 19 17  Temp:    SpO2: 100% 100%    Last Pain:  Vitals:   04/18/23 0722  TempSrc:   PainSc: 0-No pain                 Pat Sires A.

## 2023-04-18 NOTE — Interval H&P Note (Signed)
History and Physical Interval Note:  04/18/2023 7:12 AM  Felicia Perkins  has presented today for surgery, with the diagnosis of Status post cannulated screw fixation and avascular necrosis left hip.  The various methods of treatment have been discussed with the patient and family. After consideration of risks, benefits and other options for treatment, the patient has consented to  Procedure(s): TOTAL HIP ARTHROPLASTY ANTERIOR APPROACH (Left) as a surgical intervention.  The patient's history has been reviewed, patient examined, no change in status, stable for surgery.  I have reviewed the patient's chart and labs.  Questions were answered to the patient's satisfaction.     Shelda Pal

## 2023-04-18 NOTE — Brief Op Note (Signed)
04/18/2023  10:36 AM  PATIENT:  Felicia Perkins  47 y.o. female  PRE-OPERATIVE DIAGNOSIS:  Status post cannulated screw fixation with development of avascular necrosis left hip  POST-OPERATIVE DIAGNOSIS:  Status post cannulated screw fixation with development of avascular necrosis left hip  PROCEDURE:  Procedure(s): Conversion of previous left hip surgery to left TOTAL HIP ARTHROPLASTY ANTERIOR APPROACH (Left)  SURGEON:  Surgeons and Role:    Durene Romans, MD - Primary  PHYSICIAN ASSISTANT: Rosalene Billings, PA-C  ANESTHESIA:   spinal  EBL:  375 mL   BLOOD ADMINISTERED:none  DRAINS: none   LOCAL MEDICATIONS USED:  NONE  SPECIMEN:  No Specimen  DISPOSITION OF SPECIMEN:  N/A  COUNTS:  YES  TOURNIQUET:  * No tourniquets in log *  DICTATION: .Other Dictation: Dictation Number [id number]  PLAN OF CARE: Admit to inpatient   PATIENT DISPOSITION:  PACU - hemodynamically stable.   Delay start of Pharmacological VTE agent (>24hrs) due to surgical blood loss or risk of bleeding: no

## 2023-04-18 NOTE — Discharge Instructions (Signed)

## 2023-04-19 ENCOUNTER — Encounter (HOSPITAL_COMMUNITY): Payer: Self-pay | Admitting: Orthopedic Surgery

## 2023-04-19 DIAGNOSIS — M87852 Other osteonecrosis, left femur: Secondary | ICD-10-CM | POA: Diagnosis not present

## 2023-04-19 DIAGNOSIS — M1612 Unilateral primary osteoarthritis, left hip: Secondary | ICD-10-CM | POA: Diagnosis not present

## 2023-04-19 DIAGNOSIS — I10 Essential (primary) hypertension: Secondary | ICD-10-CM | POA: Diagnosis not present

## 2023-04-19 DIAGNOSIS — Z8541 Personal history of malignant neoplasm of cervix uteri: Secondary | ICD-10-CM | POA: Diagnosis not present

## 2023-04-19 DIAGNOSIS — I2510 Atherosclerotic heart disease of native coronary artery without angina pectoris: Secondary | ICD-10-CM | POA: Diagnosis not present

## 2023-04-19 DIAGNOSIS — F17210 Nicotine dependence, cigarettes, uncomplicated: Secondary | ICD-10-CM | POA: Diagnosis not present

## 2023-04-19 DIAGNOSIS — J449 Chronic obstructive pulmonary disease, unspecified: Secondary | ICD-10-CM | POA: Diagnosis not present

## 2023-04-19 LAB — BASIC METABOLIC PANEL
Anion gap: 10 (ref 5–15)
BUN: 6 mg/dL (ref 6–20)
CO2: 23 mmol/L (ref 22–32)
Calcium: 8.8 mg/dL — ABNORMAL LOW (ref 8.9–10.3)
Chloride: 103 mmol/L (ref 98–111)
Creatinine, Ser: 0.52 mg/dL (ref 0.44–1.00)
GFR, Estimated: >60 mL/min (ref >60)
Glucose, Bld: 119 mg/dL — ABNORMAL HIGH (ref 70–99)
Potassium: 3.8 mmol/L (ref 3.5–5.1)
Sodium: 136 mmol/L (ref 135–145)

## 2023-04-19 LAB — CBC
HCT: 33.9 % — ABNORMAL LOW (ref 36.0–46.0)
Hemoglobin: 11.3 g/dL — ABNORMAL LOW (ref 12.0–15.0)
MCH: 34.1 pg — ABNORMAL HIGH (ref 26.0–34.0)
MCHC: 33.3 g/dL (ref 30.0–36.0)
MCV: 102.4 fL — ABNORMAL HIGH (ref 80.0–100.0)
Platelets: 189 10*3/uL (ref 150–400)
RBC: 3.31 MIL/uL — ABNORMAL LOW (ref 3.87–5.11)
RDW: 13.2 % (ref 11.5–15.5)
WBC: 16 10*3/uL — ABNORMAL HIGH (ref 4.0–10.5)
nRBC: 0 % (ref 0.0–0.2)

## 2023-04-19 MED ORDER — SENNA 8.6 MG PO TABS: 2.0000 | ORAL_TABLET | Freq: Every day | ORAL | 0 refills | Status: DC

## 2023-04-19 MED ORDER — TIZANIDINE HCL 2 MG PO TABS: 2.0000 mg | ORAL_TABLET | Freq: Three times a day (TID) | ORAL | 2 refills | Status: DC | PRN

## 2023-04-19 MED ORDER — OXYCODONE HCL 5 MG PO TABS: 5.0000 mg | ORAL_TABLET | ORAL | 0 refills | Status: DC | PRN

## 2023-04-19 NOTE — Plan of Care (Signed)
  Problem: Activity: Goal: Ability to avoid complications of mobility impairment will improve Outcome: Progressing   Problem: Pain Management: Goal: Pain level will decrease with appropriate interventions Outcome: Progressing   Problem: Skin Integrity: Goal: Will show signs of wound healing Outcome: Progressing   

## 2023-04-19 NOTE — Progress Notes (Signed)
Patient ID: Felicia Perkins, female   DOB: [birth year], 47 y.o.   MRN: [id number] Subjective: 1 Day Post-Op Procedure(s) (LRB): TOTAL HIP ARTHROPLASTY ANTERIOR APPROACH (Left)    Patient reports pain as moderate.  She reports mores with throbbing discomfort and mild cramping Walked yesterday  Objective:   VITALS:   Vitals:   04/19/23 0219 04/19/23 0533  BP: 132/87 (!) 162/90  Pulse: 62 65  Resp: 18 18  Temp: 97.8 F (36.6 C) 97.8 F (36.6 C)  SpO2: 100% 100%    Neurovascular intact Incision: dressing C/D/I  LABS Recent Labs    04/19/23 0348  HGB 11.3*  HCT 33.9*  WBC 16.0*  PLT 189    Recent Labs    04/19/23 0348  NA 136  K 3.8  BUN 6  CREATININE 0.52  GLUCOSE 119*    No results for input(s): "LABPT", "INR" in the last 72 hours.   Assessment/Plan: 1 Day Post-Op Procedure(s) (LRB): TOTAL HIP ARTHROPLASTY ANTERIOR APPROACH (Left)   Advance diet Up with therapy Home today after therapy Reviewed exercises to do in bed to help work through muscle soreness from the procedure ASA for DVT prophylaxis RTC in 2 week WBAT

## 2023-04-19 NOTE — Progress Notes (Signed)
Physical Therapy Treatment Patient Details Name: Felicia Perkins MRN: [id number] DOB: 03/02/1976 Today's Date: 04/19/2023   History of Present Illness 47 yo female presents to therapy s/p L THA, anterior approach on 04/18/2023 due to failure of conservative measures and avascular necrosis attributed to hx of L femoral neck fx with subsequent percutaneous screw fixation on 06/05/2022. Marland Kitchen Pt PMH includes but is not limited to: CAD, pancreatitis, HTN, tobacco abuse, GERD, PTSD, cirrhosis, cervical ca, and COPD.    PT Comments  Pt is POD # 1 and is progressing well.  She was able to ambulate 16' with supervision and does not have stairs at home.  Pt has necessary DME and home support.  She was educated on HEP and progression with written copy provided - pt demonstrating understanding.  Pt demonstrates safe gait & transfers in order to return home from PT perspective once discharged by MD.  While in hospital, will continue to benefit from PT for skilled therapy to advance mobility and exercises.       If plan is discharge home, recommend the following: A little help with walking and/or transfers;A little help with bathing/dressing/bathroom;Assistance with cooking/housework;Assist for transportation;Help with stairs or ramp for entrance   Can travel by private vehicle        Equipment Recommendations  None recommended by PT    Recommendations for Other Services       Precautions / Restrictions Precautions Precautions: Fall Restrictions Weight Bearing Restrictions: Yes LLE Weight Bearing: Weight bearing as tolerated     Mobility  Bed Mobility Overal bed mobility: Needs Assistance Bed Mobility: Supine to Sit, Sit to Supine     Supine to sit: Supervision Sit to supine: Min assist   General bed mobility comments: Self assist L LE to sit EOB.  Required light min A for L LE back to bed.  Educated on gait belt to assist or having family assist if needed    Transfers Overall transfer level:  Needs assistance Equipment used: Rolling walker (2 wheels) Transfers: Sit to/from Stand Sit to Stand: Supervision           General transfer comment: Good hand placement and L LE management    Ambulation/Gait Ambulation/Gait assistance: Supervision Gait Distance (Feet): 80 Feet Assistive device: Rolling walker (2 wheels)   Gait velocity: decreased but functional     General Gait Details: Starting with antalgic step to pattern but progressed to step through; light use of RW (pt prefers low height on RW); Had a narrow BOS but corrected with cues; steady balance   Stairs Stairs:  (Level entry; stays first floor; educated on sequencing if needed)           Wheelchair Mobility     Tilt Bed    Modified Rankin (Stroke Patients Only)       Balance Overall balance assessment: Needs assistance Sitting-balance support: Feet supported Sitting balance-Leahy Scale: Good     Standing balance support: Bilateral upper extremity supported, No upper extremity supported Standing balance-Leahy Scale: Fair Standing balance comment: pt is able to maintain static standing balance without UE support; LIght RW support to ambulate                            Cognition Arousal: Alert Behavior During Therapy: WFL for tasks assessed/performed Overall Cognitive Status: Within Functional Limits for tasks assessed  Exercises Total Joint Exercises Ankle Circles/Pumps: AROM, Both, 10 reps, Supine Quad Sets: AROM, Both, 10 reps, Supine Heel Slides: AAROM, Left, 10 reps, Supine Hip ABduction/ADduction: AAROM, Left, 10 reps, Supine Long Arc Quad: AROM, Left, 5 reps, Seated Other Exercises Other Exercises: Standing with supervision and  RW AROM L LE 5 reps: marching, hip abd, hamstring curl, hip ext Other Exercises: On all educated on correct techniques, positions, and AAROM as needed.    General Comments   Educated  on safe ice use, no pivots, car transfers. Also, encouraged walking every 1-2 hours during day. Educated on HEP with focus on mobility the first weeks. Discussed doing exercises within pain control and if pain increasing could decreased ROM, reps, and stop exercises as needed in the first week. Encouraged to perform ankle pumps frequently for blood flow      Pertinent Vitals/Pain Pain Assessment Pain Assessment: 0-10 Pain Score: 4  Pain Location: L hip and leg Pain Descriptors / Indicators: Sore, Discomfort Pain Intervention(s): Limited activity within patient's tolerance, Monitored during session, Premedicated before session, Repositioned, Ice applied    Home Living                          Prior Function            PT Goals (current goals can now be found in the care plan section) Progress towards PT goals: Progressing toward goals    Frequency    7X/week      PT Plan      Co-evaluation              AM-PAC PT "6 Clicks" Mobility   Outcome Measure  Help needed turning from your back to your side while in a flat bed without using bedrails?: None Help needed moving from lying on your back to sitting on the side of a flat bed without using bedrails?: A Little Help needed moving to and from a bed to a chair (including a wheelchair)?: A Little Help needed standing up from a chair using your arms (e.g., wheelchair or bedside chair)?: A Little Help needed to walk in hospital room?: A Little Help needed climbing 3-5 steps with a railing? : A Little 6 Click Score: 19    End of Session Equipment Utilized During Treatment: Gait belt Activity Tolerance: Patient tolerated treatment well Patient left: with call bell/phone within reach;in bed;with bed alarm set Nurse Communication: Mobility status PT Visit Diagnosis: Unsteadiness on feet (R26.81);Other abnormalities of gait and mobility (R26.89);Muscle weakness (generalized) (M62.81);Difficulty in walking, not  elsewhere classified (R26.2);Pain Pain - Right/Left: Left Pain - part of body: Hip;Leg     Time: 1111-1141 PT Time Calculation (min) (ACUTE ONLY): 30 min  Charges:    $Gait Training: 8-22 mins $Therapeutic Exercise: 8-22 mins PT General Charges $$ ACUTE PT VISIT: 1 Visit                     Anise Salvo, PT Acute Rehab Services [facility] [phone]    Rayetta Humphrey 04/19/2023, 11:58 AM

## 2023-04-19 NOTE — TOC Transition Note (Signed)
Transition of Care [facility]) - CM/SW Discharge Note   Patient Details  Name: Felicia Perkins MRN: [id number] Date of Birth: [birth year]  Transition of [facility]) CM/SW Contact:  Amada Jupiter, LCSW Phone Number: 04/19/2023, 2:12 PM   Clinical Narrative:     Met briefly and confirming pt has needed DME in the home and plan for HEP.  No TOC needs.  Final next level of care: Home/Self Care Barriers to Discharge: No Barriers Identified   Patient Goals and CMS Choice      Discharge Placement                         Discharge Plan and Services Additional resources added to the After Visit Summary for                  DME Arranged: N/A DME Agency: NA                  Social Determinants of Health (SDOH) Interventions SDOH Screenings   Food Insecurity: No Food Insecurity (04/18/2023)  Housing: Low Risk  (04/18/2023)  Transportation Needs: No Transportation Needs (04/18/2023)  Utilities: Not At Risk (04/18/2023)  Alcohol Screen: Medium Risk (12/04/2022)  Depression (PHQ2-9): Low Risk  (04/16/2023)  Financial Resource Strain: Medium Risk (12/04/2022)  Physical Activity: Insufficiently Active (12/04/2022)  Social Connections: Moderately Isolated (12/04/2022)  Stress: Stress Concern Present (12/04/2022)  Tobacco Use: High Risk (04/18/2023)     Readmission Risk Interventions    11/01/2022   10:10 AM  Readmission Risk Prevention Plan  Transportation Screening Complete  PCP or Specialist Appt within 5-7 Days Complete  Home Care Screening Complete  Medication Review (RN CM) Complete

## 2023-04-22 ENCOUNTER — Telehealth: Payer: Self-pay

## 2023-04-22 NOTE — Transitions of Care (Post Inpatient/ED Visit) (Signed)
   04/22/2023  Name: CARVER NAPOLEONE MRN: [id number] DOB: 01/10/1976  Today's TOC FU Call Status: Today's TOC FU Call Status:: Successful TOC FU Call Completed TOC FU Call Complete Date: 04/22/23 Patient's Name and Date of Birth confirmed.  Transition Care Management Follow-up Telephone Call Date of Discharge: 04/19/23 Discharge Facility: [facility]) Type of Discharge: Inpatient Admission Primary Inpatient Discharge Diagnosis:: left THA How have you been since you were released from the hospital?: Same (She reports still being in alot of pain but noted that the pain medication has been helping) Any questions or concerns?: No  Items Reviewed: Did you receive and understand the discharge instructions provided?: Yes Medications obtained,verified, and reconciled?: Partial Review Completed Reason for Partial Mediation Review: She said she has all of her medications and did not have any questions about the meds and did not need to review the med list. Any new allergies since your discharge?: No Dietary orders reviewed?: Yes Type of Diet Ordered:: heart healthy Do you have support at home?: Yes People in Home: significant other Name of Support/Comfort Primary Source: her significant other and her daughter  Medications Reviewed Today: Medications Reviewed Today   Medications were not reviewed in this encounter     Home Care and Equipment/Supplies: Were Home Health Services Ordered?: No Any new equipment or medical supplies ordered?: No  Functional Questionnaire: Do you need assistance with bathing/showering or dressing?: Yes (has assistance if needed) Do you need assistance with meal preparation?: Yes Do you need assistance with eating?: No Do you have difficulty maintaining continence: No Do you need assistance with getting out of bed/getting out of a chair/moving?: Yes (ambulates with RW, WBAT LLE.  She said the dressing remains intact on her surgical site and she understands  to leave it in place until she sees the surgeon.) Do you have difficulty managing or taking your medications?: No  Follow up appointments reviewed: PCP Follow-up appointment confirmed?: NA Specialist Hospital Follow-up appointment confirmed?: Yes Date of Specialist follow-up appointment?: 05/02/23 Follow-Up Specialty Provider:: orthopedic surgeon Do you need transportation to your follow-up appointment?: No Do you understand care options if your condition(s) worsen?: Yes-patient verbalized understanding    SIGNATURE.  Robyne Peers, RN

## 2023-04-29 NOTE — Discharge Summary (Signed)
Patient ID: Felicia Perkins MRN: [id number] DOB/AGE: 48-May-1977 48 y.o.  Admit date: 04/18/2023 Discharge date: 04/19/2023  Admission Diagnoses:  Left hip osteoarthritis  Discharge Diagnoses:  Principal Problem:   S/P total left hip arthroplasty   Past Medical History:  Diagnosis Date   Acute on chronic pancreatitis (HCC) 03/22/2019   Acute pancreatitis    Alcohol abuse    Alcohol dependence (HCC) 12/16/2012   Medical detox successful    Alcohol induced acute pancreatitis 11/22/2017   Alcoholic cirrhosis of liver without ascites (HCC) 10/15/2016   Anemia    Anxiety    Aortic atherosclerosis (HCC)    Bipolar 1 disorder (HCC)    Cancer (HCC)    cervical cancer   Chest pain with moderate risk for cardiac etiology 12/15/2014   Cirrhosis (HCC)    COPD (chronic obstructive pulmonary disease) (HCC) 09/04/2016   Coronary artery disease    Depression    Duodenitis 11/22/2017   Eczema 10/09/2018   Elevated liver enzymes 12/22/2014   Fracture of femoral neck, left (HCC) 06/04/2022   Gastritis and gastroduodenitis 09/04/2016   GERD (gastroesophageal reflux disease)    Hip fracture (HCC) 06/03/2022   Nondisplaced fracture of neck of fifth metacarpal bone, right hand, initial encounter for closed fracture 08/26/2017   Pancreatitis    Peripheral vascular disease (HCC)    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   PTSD (post-traumatic stress disorder)    Tobacco abuse 09/04/2016   Tobacco abuse counseling    Vascular disease     Surgeries: Procedure(s): TOTAL HIP ARTHROPLASTY ANTERIOR APPROACH on 04/18/2023   Consultants:   Discharged Condition: Improved  Hospital Course: CICELY NUTE is an 47 y.o. female who was admitted 04/18/2023 for operative treatment ofS/P total left hip arthroplasty. Patient has severe unremitting pain that affects sleep, daily activities, and work/hobbies. After pre-op clearance the patient was taken to the operating room on 04/18/2023 and underwent   Procedure(s): TOTAL HIP ARTHROPLASTY ANTERIOR APPROACH.    Patient was given perioperative antibiotics:  Anti-infectives (From admission, onward)    Start     Dose/Rate Route Frequency Ordered Stop   04/18/23 1500  ceFAZolin (ANCEF) IVPB 2g/100 mL premix        2 g 200 mL/hr over 30 Minutes Intravenous Every 6 hours 04/18/23 1309 04/18/23 2032   04/18/23 0700  ceFAZolin (ANCEF) IVPB 2g/100 mL premix        2 g 200 mL/hr over 30 Minutes Intravenous On call to O.R. 04/18/23 5784 04/18/23 0920        Patient was given sequential compression devices, early ambulation, and chemoprophylaxis to prevent DVT. Patient worked with PT and was meeting their goals regarding safe ambulation and transfers.  Patient benefited maximally from hospital stay and there were no complications.    Recent vital signs: No data found.   Recent laboratory studies: No results for input(s): "WBC", "HGB", "HCT", "PLT", "NA", "K", "CL", "CO2", "BUN", "CREATININE", "GLUCOSE", "INR", "CALCIUM" in the last 72 hours.  Invalid input(s): "PT", "2"   Discharge Medications:   Allergies as of 04/19/2023   No Known Allergies      Medication List     TAKE these medications    albuterol 108 (90 Base) MCG/ACT inhaler Commonly known as: VENTOLIN HFA Inhale 1-2 puffs into the lungs every 6 (six) hours as needed for wheezing or shortness of breath.   B-complex with vitamin C tablet Take 1 tablet by mouth daily.   Calcium-Magnesium-Zinc-D3 Tabs Take 1 tablet by mouth daily.  clopidogrel 75 MG tablet Commonly known as: PLAVIX Take 1 tablet (75 mg total) by mouth daily.   Dulera 200-5 MCG/ACT Aero Generic drug: mometasone-formoterol Inhale 2 puffs into the lungs 2 (two) times daily. What changed:  when to take this reasons to take this   famotidine 20 MG tablet Commonly known as: PEPCID Take 1 tablet (20 mg total) by mouth 2 (two) times daily.   hydrOXYzine 25 MG tablet Commonly known as: ATARAX Take 25  mg by mouth 3 (three) times daily.   multivitamin with minerals Tabs tablet Take 1 tablet by mouth daily.   nicotine 14 mg/24hr patch Commonly known as: NICODERM CQ - dosed in mg/24 hours Place 14 mg onto the skin daily as needed (nicotine cessation).   ondansetron 4 MG tablet Commonly known as: ZOFRAN Take 1 tablet (4 mg total) by mouth every 8 (eight) hours as needed for nausea or vomiting.   OVER THE COUNTER MEDICATION Take 1 capsule by mouth daily. Sea Moss   oxyCODONE 5 MG immediate release tablet Commonly known as: Oxy IR/ROXICODONE Take 1-2 tablets (5-10 mg total) by mouth every 4 (four) hours as needed for severe pain. Start with 1 tablet every 4 hours, use 2 only for severe pain.   pantoprazole 40 MG tablet Commonly known as: PROTONIX Take 1 tablet (40 mg total) by mouth daily.   polyethylene glycol 17 g packet Commonly known as: MIRALAX / GLYCOLAX Take 17 g by mouth daily. What changed:  when to take this reasons to take this   rosuvastatin 10 MG tablet Commonly known as: CRESTOR Take 1 tablet (10 mg total) by mouth daily.   senna 8.6 MG Tabs tablet Commonly known as: SENOKOT Take 2 tablets (17.2 mg total) by mouth at bedtime.   tiZANidine 2 MG tablet Commonly known as: ZANAFLEX Take 1-2 tablets (2-4 mg total) by mouth every 8 (eight) hours as needed for muscle spasms. What changed:  how much to take when to take this reasons to take this               Discharge Care Instructions  (From admission, onward)           Start     Ordered   04/19/23 0000  Change dressing       Comments: Maintain surgical dressing until follow up in the clinic. If the edges start to pull up, may reinforce with tape. If the dressing is no longer working, may remove and cover with gauze and tape, but must keep the area dry and clean.  Call with any questions or concerns.   04/19/23 0821            Diagnostic Studies: DG HIP UNILAT WITH PELVIS 1V LEFT  Result  Date: 04/18/2023 CLINICAL DATA:  Elective surgery. EXAM: DG HIP (WITH OR WITHOUT PELVIS) 1V*L* COMPARISON:  01/31/2023 FINDINGS: Two fluoroscopic spot views of the pelvis and left hip obtained in the operating room. Images during hip arthroplasty. Fluoroscopy time 13 seconds. Dose 1.125 mGy. IMPRESSION: Intraoperative fluoroscopy for left hip arthroplasty. Electronically Signed   By: Narda Rutherford M.D.   On: 04/18/2023 12:22   DG Pelvis Portable  Result Date: 04/18/2023 CLINICAL DATA:  Status post hip arthroplasty. EXAM: PORTABLE PELVIS 1-2 VIEWS COMPARISON:  Radiograph 01/31/2023 FINDINGS: Left hip arthroplasty in expected alignment. No periprosthetic lucency or fracture. Ghost tracks at prior trochanteric pins. Recent postsurgical change includes air and edema in the soft tissues. IMPRESSION: Left hip arthroplasty without immediate postoperative  complication. Electronically Signed   By: Narda Rutherford M.D.   On: 04/18/2023 12:22   DG C-Arm 1-60 Min-No Report  Result Date: 04/18/2023 Fluoroscopy was utilized by the requesting physician.  No radiographic interpretation.   DG C-Arm 1-60 Min-No Report  Result Date: 04/18/2023 Fluoroscopy was utilized by the requesting physician.  No radiographic interpretation.    Disposition: Discharge disposition: 01-Home or Self Care       Discharge Instructions     Call MD / Call 911   Complete by: As directed    If you experience chest pain or shortness of breath, CALL 911 and be transported to the hospital emergency room.  If you develope a fever above 101 F, pus (white drainage) or increased drainage or redness at the wound, or calf pain, call your surgeon's office.   Change dressing   Complete by: As directed    Maintain surgical dressing until follow up in the clinic. If the edges start to pull up, may reinforce with tape. If the dressing is no longer working, may remove and cover with gauze and tape, but must keep the area dry and clean.  Call  with any questions or concerns.   Constipation Prevention   Complete by: As directed    Drink plenty of fluids.  Prune juice may be helpful.  You may use a stool softener, such as Colace (over the counter) 100 mg twice a day.  Use MiraLax (over the counter) for constipation as needed.   Diet - low sodium heart healthy   Complete by: As directed    Increase activity slowly as tolerated   Complete by: As directed    Weight bearing as tolerated with assist device (walker, cane, etc) as directed, use it as long as suggested by your surgeon or therapist, typically at least 4-6 weeks.   Post-operative opioid taper instructions:   Complete by: As directed    POST-OPERATIVE OPIOID TAPER INSTRUCTIONS: It is important to wean off of your opioid medication as soon as possible. If you do not need pain medication after your surgery it is ok to stop day one. Opioids include: Codeine, Hydrocodone(Norco, Vicodin), Oxycodone(Percocet, oxycontin) and hydromorphone amongst others.  Long term and even short term use of opiods can cause: Increased pain response Dependence Constipation Depression Respiratory depression And more.  Withdrawal symptoms can include Flu like symptoms Nausea, vomiting And more Techniques to manage these symptoms Hydrate well Eat regular healthy meals Stay active Use relaxation techniques(deep breathing, meditating, yoga) Do Not substitute Alcohol to help with tapering If you have been on opioids for less than two weeks and do not have pain than it is ok to stop all together.  Plan to wean off of opioids This plan should start within one week post op of your joint replacement. Maintain the same interval or time between taking each dose and first decrease the dose.  Cut the total daily intake of opioids by one tablet each day Next start to increase the time between doses. The last dose that should be eliminated is the evening dose.      TED hose   Complete by: As  directed    Use stockings (TED hose) for 2 weeks on both leg(s).  You may remove them at night for sleeping.        Follow-up Information     Durene Romans, MD. Schedule an appointment as soon as possible for a visit in 2 week(s).   Specialty: Orthopedic Surgery Contact information:  [address] [place] [address] [place] [address] [phone]                  Signed: Cassandria Anger 04/29/2023, 2:55 PM

## 2023-06-05 ENCOUNTER — Ambulatory Visit: Payer: Medicaid Other | Admitting: Family Medicine

## 2023-06-05 DIAGNOSIS — Z96642 Presence of left artificial hip joint: Secondary | ICD-10-CM | POA: Diagnosis not present

## 2023-06-08 ENCOUNTER — Other Ambulatory Visit: Payer: Self-pay | Admitting: Family Medicine

## 2023-06-10 MED ORDER — ONDANSETRON HCL 4 MG PO TABS: 4.0000 mg | ORAL_TABLET | Freq: Three times a day (TID) | ORAL | 0 refills | Status: DC | PRN

## 2023-06-14 ENCOUNTER — Encounter: Payer: Self-pay | Admitting: Family Medicine

## 2023-06-19 ENCOUNTER — Other Ambulatory Visit: Payer: Self-pay

## 2023-06-19 ENCOUNTER — Encounter: Payer: Self-pay | Admitting: Family Medicine

## 2023-06-19 DIAGNOSIS — K7030 Alcoholic cirrhosis of liver without ascites: Secondary | ICD-10-CM

## 2023-06-19 MED ORDER — PANTOPRAZOLE SODIUM 40 MG PO TBEC: 40.0000 mg | DELAYED_RELEASE_TABLET | Freq: Every day | ORAL | 6 refills | Status: DC

## 2023-06-24 ENCOUNTER — Other Ambulatory Visit (HOSPITAL_COMMUNITY): Payer: 59 | Attending: Medical Genetics

## 2023-08-05 ENCOUNTER — Other Ambulatory Visit: Payer: Self-pay | Admitting: Family Medicine

## 2023-08-19 ENCOUNTER — Ambulatory Visit: Payer: 59 | Admitting: Family Medicine

## 2023-08-22 ENCOUNTER — Encounter: Payer: Self-pay | Admitting: Family Medicine

## 2023-08-23 ENCOUNTER — Other Ambulatory Visit: Payer: Self-pay | Admitting: Family Medicine

## 2023-08-23 MED ORDER — TRIAMCINOLONE ACETONIDE 0.1 % EX CREA: 1.0000 | TOPICAL_CREAM | Freq: Two times a day (BID) | CUTANEOUS | 0 refills | Status: DC

## 2023-09-19 IMAGING — CR DG ABDOMEN 1V
1 series · 1 of 1 positions shown · non-contrast
Comparison: X-ray abdomen 01/02/2019, CT abdomen pelvis 10/24/2020

CLINICAL DATA: Constipation

EXAM:
ABDOMEN - 1 VIEW

[abdomen kub]
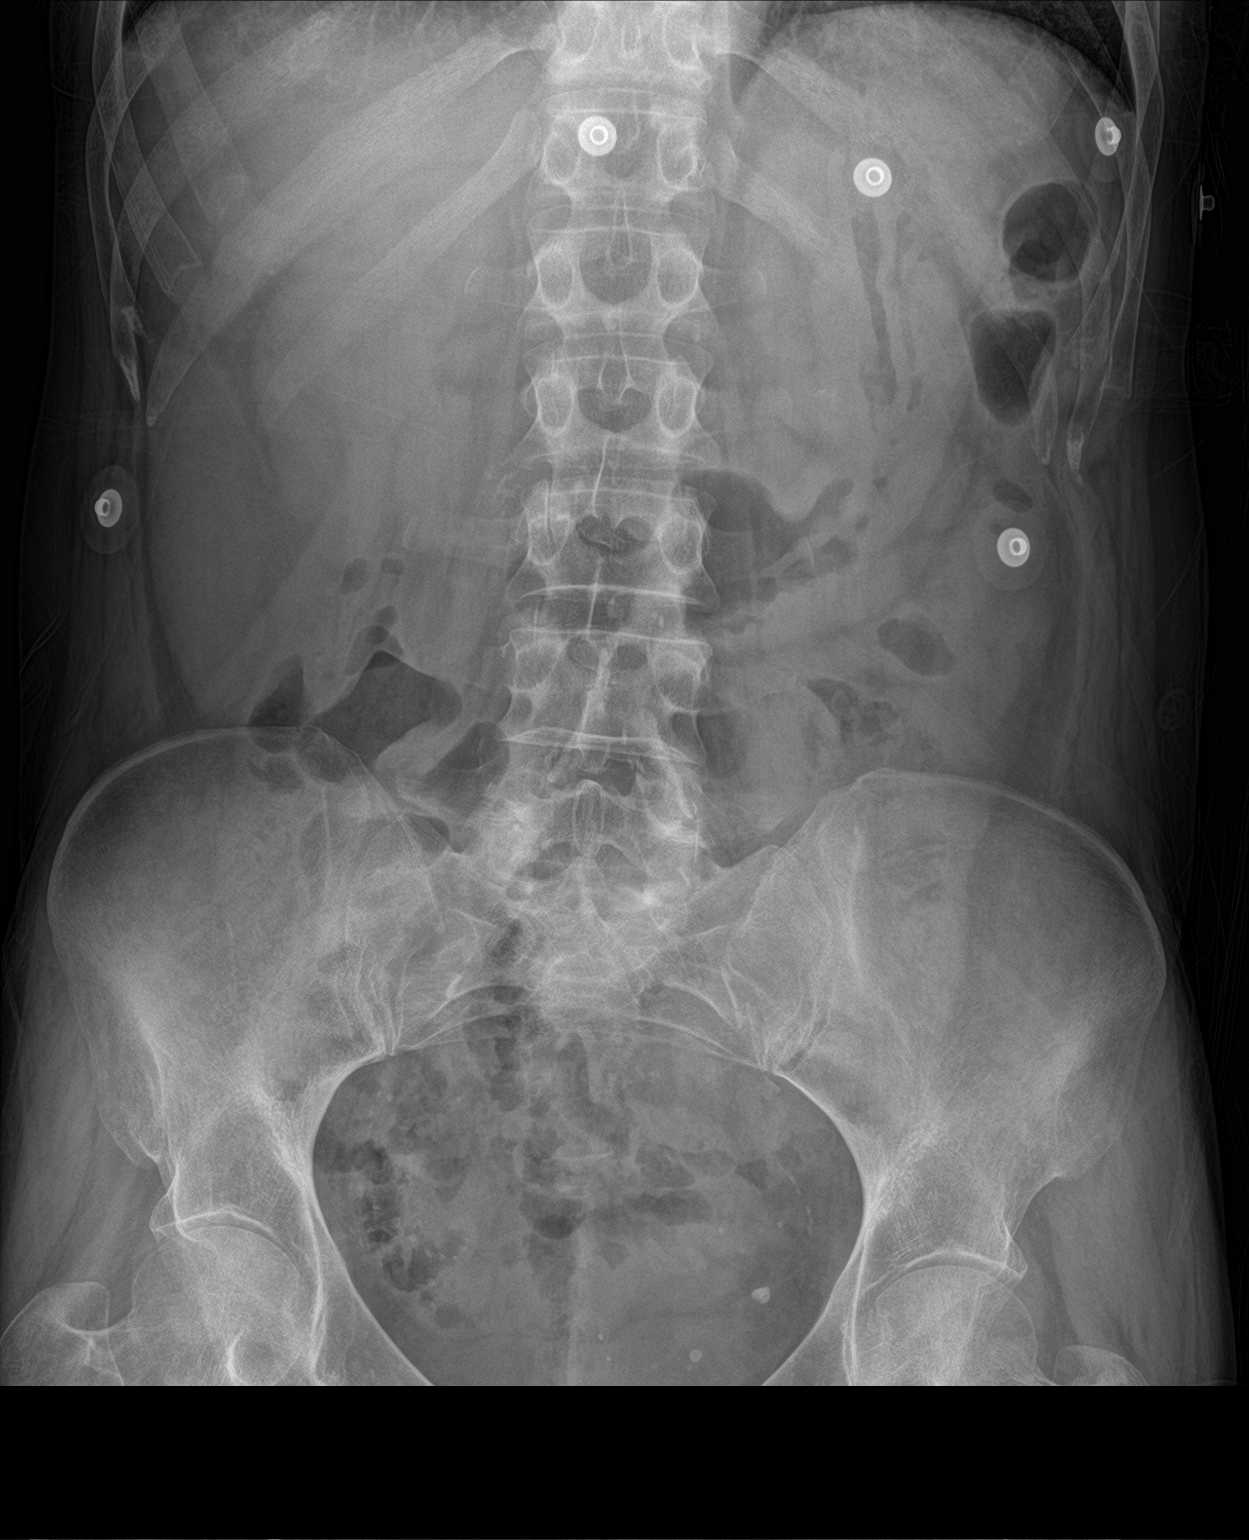

[1 of 1 positions shown; findings below may reference images not displayed]

FINDINGS: The bowel gas pattern is normal. No radio-opaque calculi or other
significant radiographic abnormality are seen.
IMPRESSION: Negative.

## 2023-09-23 ENCOUNTER — Encounter (INDEPENDENT_AMBULATORY_CARE_PROVIDER_SITE_OTHER): Payer: Self-pay | Admitting: Primary Care

## 2023-09-23 ENCOUNTER — Ambulatory Visit (INDEPENDENT_AMBULATORY_CARE_PROVIDER_SITE_OTHER): Payer: MEDICAID | Admitting: Primary Care

## 2023-09-23 ENCOUNTER — Ambulatory Visit: Payer: Self-pay | Admitting: Family Medicine

## 2023-09-23 VITALS — BP 160/94 | HR 85 | Temp 97.6°F | Resp 16 | Ht 64.0 in | Wt 99.6 lb

## 2023-09-23 DIAGNOSIS — I10 Essential (primary) hypertension: Secondary | ICD-10-CM

## 2023-09-23 DIAGNOSIS — R6889 Other general symptoms and signs: Secondary | ICD-10-CM

## 2023-09-23 LAB — POC SOFIA 2 FLU + SARS ANTIGEN FIA
Influenza A, POC: NEGATIVE
Influenza B, POC: NEGATIVE
SARS Coronavirus 2 Ag: NEGATIVE

## 2023-09-23 NOTE — Telephone Encounter (Signed)
 This RN made second attempt to triage. No answer, left a message. Will continue to attempt.

## 2023-09-23 NOTE — Progress Notes (Signed)
 [facility]  Felicia Perkins, is a 48 y.o. female  [id number]  [id number]  DOB - November 02, 1975  Chief Complaint  Patient presents with   URI    Runny nose Cough Diarrhea Chills Took nyquil this morning at 10am        Subjective:   Felicia Perkins is a 48 y.o. female here today for an acute visit.  Chief Complaint  Patient presents with   URI    Runny nose Cough Diarrhea Chills Took nyquil this morning at 10am    No problems updated.  Comprehensive ROS Pertinent positive and negative noted in HPI   No Known Allergies  Past Medical History:  Diagnosis Date   Acute on chronic pancreatitis (HCC) 03/22/2019   Acute pancreatitis    Alcohol  abuse    Alcohol  dependence (HCC) 12/16/2012   Medical detox successful    Alcohol  induced acute pancreatitis 11/22/2017   Alcoholic cirrhosis of liver without ascites (HCC) 10/15/2016   Anemia    Anxiety    Aortic atherosclerosis (HCC)    Bipolar 1 disorder (HCC)    Cancer (HCC)    cervical cancer   Chest pain with moderate risk for cardiac etiology 12/15/2014   Cirrhosis (HCC)    COPD (chronic obstructive pulmonary disease) (HCC) 09/04/2016   Coronary artery disease    Depression    Duodenitis 11/22/2017   Eczema 10/09/2018   Elevated liver enzymes 12/22/2014   Fracture of femoral neck, left (HCC) 06/04/2022   Gastritis and gastroduodenitis 09/04/2016   GERD (gastroesophageal reflux disease)    Hip fracture (HCC) 06/03/2022   Nondisplaced fracture of neck of fifth metacarpal bone, right hand, initial encounter for closed fracture 08/26/2017   Pancreatitis    Peripheral vascular disease (HCC)    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   PTSD (post-traumatic stress disorder)    Tobacco abuse 09/04/2016   Tobacco abuse counseling    Vascular disease     Current Outpatient Medications on File Prior to Visit  Medication Sig Dispense Refill   albuterol  (VENTOLIN  HFA) 108 (90 Base) MCG/ACT inhaler Inhale  1-2 puffs into the lungs every 6 (six) hours as needed for wheezing or shortness of breath. 8.5 g 2   clopidogrel  (PLAVIX ) 75 MG tablet Take 1 tablet (75 mg total) by mouth daily. 30 tablet 11   famotidine  (PEPCID ) 20 MG tablet Take 1 tablet (20 mg total) by mouth 2 (two) times daily. 60 tablet 2   mometasone -formoterol  (DULERA ) 200-5 MCG/ACT AERO Inhale 2 puffs into the lungs 2 (two) times daily. (Patient taking differently: Inhale 2 puffs into the lungs 2 (two) times daily as needed for shortness of breath or wheezing.)     nicotine  (NICODERM CQ  - DOSED IN MG/24 HOURS) 14 mg/24hr patch Place 14 mg onto the skin daily as needed (nicotine  cessation).     ondansetron  (ZOFRAN ) 4 MG tablet TAKE 1 TABLET BY MOUTH EVERY 8 HOURS AS NEEDED FOR NAUSEA AND VOMITING 18 tablet 1   OVER THE COUNTER MEDICATION Take 1 capsule by mouth daily. [place]     pantoprazole  (PROTONIX ) 40 MG tablet Take 1 tablet (40 mg total) by mouth daily. 30 tablet 6   polyethylene glycol (MIRALAX  / GLYCOLAX ) 17 g packet Take 17 g by mouth daily. (Patient taking differently: Take 17 g by mouth daily as needed for moderate constipation.) 14 each 0   rosuvastatin  (CRESTOR ) 10 MG tablet Take 1 tablet (10 mg total) by mouth daily. 30 tablet 11   senna (SENOKOT) 8.6  MG TABS tablet Take 2 tablets (17.2 mg total) by mouth at bedtime. 120 tablet 0   triamcinolone  cream (KENALOG ) 0.1 % Apply 1 Application topically 2 (two) times daily. 45 g 0   [DISCONTINUED] cloNIDine  (CATAPRES ) 0.1 MG tablet Take 1 tablet (0.1 mg total) by mouth at bedtime. For hot flashes (Patient not taking: Reported on 10/17/2019) 30 tablet 3   No current facility-administered medications on file prior to visit.   Health Maintenance  Topic Date Due   COVID-19 Vaccine (1) Never done   Pneumococcal Vaccination (1 of 2 - PCV) Never done   Pap with HPV screening  Never done   DTaP/Tdap/Td vaccine (2 - Td or Tdap) 06/04/2028   Colon Cancer Screening  01/24/2029   Flu Shot   Completed   Hepatitis C Screening  Completed   HIV Screening  Completed   HPV Vaccine  Aged Out    Objective:   Vitals:   09/23/23 1623 09/23/23 1624 09/23/23 1807  BP: (!) 179/106 (!) 209/141 (!) 160/94  Pulse: 85    Resp: 16    Temp: 97.6 F (36.4 C)    SpO2: 96%    Weight: 99 lb 9.6 oz (45.2 kg)    Height: 5\' 4"  (1.626 m)     BP Readings from Last 3 Encounters:  09/23/23 (!) 160/94  04/19/23 (!) 154/90  04/16/23 124/83      Physical Exam Constitutional:      Appearance: She is ill-appearing.     Comments: Thin cathectic ill appearing  HENT:     Right Ear: Tympanic membrane and external ear normal.     Left Ear: Tympanic membrane and external ear normal.     Nose: Congestion present.     Comments: Bilateral boggy red turbinates  Eyes:     Extraocular Movements: Extraocular movements intact.  Cardiovascular:     Rate and Rhythm: Normal rate and regular rhythm.  Pulmonary:     Effort: Pulmonary effort is normal.     Breath sounds: Normal breath sounds.  Abdominal:     General: Abdomen is flat.     Palpations: Abdomen is soft.  Neurological:     Mental Status: She is alert.     Assessment & Plan  Consepcion was seen today for uri.  Diagnoses and all orders for this visit:  Flu-like symptoms -     POC SOFIA 2 FLU + SARS ANTIGEN FIA- negative  Not able or desire to eat or drink discussed risk of dehydration purchase Pedialyte and pop sickles   Essential (primary) hypertension BP goal - < 130/80 Explained that having normal blood pressure is the goal and medications are helping to get to goal and maintain normal blood pressure. DIET: Limit salt intake, read nutrition labels to check salt content, limit fried and high fatty foods  Avoid using multisymptom OTC cold preparations that generally contain sudafed which can rise BP. Consult with pharmacist on best cold relief products to use for persons with HTN EXERCISE Discussed incorporating exercise such as  walking - 30 minutes most days of the week and can do in 10 minute intervals       Patient have been counseled extensively about nutrition and exercise. Other issues discussed during this visit include: low cholesterol diet, weight control and daily exercise, foot care, annual eye examinations at Ophthalmology, importance of adherence with medications and regular follow-up. We also discussed long term complications of uncontrolled diabetes and hypertension.   Return PCP if no improvement.  The patient was given clear instructions to go to ER or return to medical center if symptoms don't improve, worsen or new problems develop. The patient verbalized understanding. The patient was told to call to get lab results if they haven't heard anything in the next week.   This note has been created with Education officer, environmental. Any transcriptional errors are unintentional.   Marius Siemens, NP 09/23/2023, 6:12 PM

## 2023-09-23 NOTE — Telephone Encounter (Signed)
  Chief Complaint: flu-like symptoms with exposure Symptoms: muscle aches, fever, diarrhea, sore throat, congestion Frequency: 2 days Pertinent Negatives: Patient denies SOB, CP Disposition: [] ED /[] Urgent Care (no appt availability in office) / [x] Appointment(In office/virtual)/ []  [facility]/ [] Home Care/ [] Refused Recommended Disposition /[]  Mobile Bus/ []  Follow-up with PCP Additional Notes: Patient calls reporting flu-like symptoms with exposure to flu positive coworker. Per protocol, patient to be evaluated within 24 hours. First available appointment with PCP not until 10/28/23. Patient scheduled with first available provider in alternate clinic for today at 1510- patient requests to be seen today . Care advice reviewed, patient verbalized understandingand denies further questions at this time. Alerting PCP for review.    Reason for Disposition  [1] Fever returns after gone for over 24 hours AND [2] symptoms worse or not improved  Answer Assessment - Initial Assessment Questions 1. WORST SYMPTOM: "What is your worst symptom?" (e.g., cough, runny nose, muscle aches, headache, sore throat, fever)      Muscle aches, Diarrhea 2. ONSET: "When did your flu symptoms start?"      2 days 3. COUGH: "How bad is the cough?"       Once starting cannot stop, feels like she will through up. 4. RESPIRATORY DISTRESS: "Describe your breathing."      Within normal limits 5. FEVER: "Do you have a fever?" If Yes, ask: "What is your temperature, how was it measured, and when did it start?"     Fever yesterday of 101F 6. EXPOSURE: "Were you exposed to someone with influenza?"       Coworker 7. FLU VACCINE: "Did you get a flu shot this year?"     Yes 8. HIGH RISK DISEASE: "Do you have any chronic medical problems?" (e.g., heart or lung disease, asthma, weak immune system, or other HIGH RISK conditions)     COPD 9. PREGNANCY: "Is there any chance you are pregnant?" "When was your  last menstrual period?"     Denies 10. OTHER SYMPTOMS: "Do you have any other symptoms?"  (e.g., runny nose, muscle aches, headache, sore throat)       Muscle aches, chills, sore throat, runny nose, congestion, cough  Protocols used: Influenza (Flu) - [facility]

## 2023-09-25 ENCOUNTER — Telehealth: Payer: Self-pay

## 2023-09-25 NOTE — Telephone Encounter (Signed)
Routing to office

## 2023-09-25 NOTE — Telephone Encounter (Signed)
Copied from CRM [id number]. Topic: General - Other >> Sep 24, 2023 12:08 PM Phill Myron wrote: Patient Felicia Perkins calling and asking if PCP Randa Evens is still going to call the Nausea medicine in for her? Please advise

## 2023-10-16 ENCOUNTER — Ambulatory Visit: Payer: MEDICAID | Attending: Family Medicine | Admitting: Family Medicine

## 2023-10-16 VITALS — BP 104/68 | HR 74 | Ht 64.0 in | Wt 103.6 lb

## 2023-10-16 DIAGNOSIS — J438 Other emphysema: Secondary | ICD-10-CM

## 2023-10-16 DIAGNOSIS — F17200 Nicotine dependence, unspecified, uncomplicated: Secondary | ICD-10-CM

## 2023-10-16 DIAGNOSIS — R002 Palpitations: Secondary | ICD-10-CM | POA: Diagnosis not present

## 2023-10-16 DIAGNOSIS — Z634 Disappearance and death of family member: Secondary | ICD-10-CM

## 2023-10-16 DIAGNOSIS — Z9889 Other specified postprocedural states: Secondary | ICD-10-CM

## 2023-10-16 DIAGNOSIS — K7030 Alcoholic cirrhosis of liver without ascites: Secondary | ICD-10-CM

## 2023-10-16 MED ORDER — DULERA 200-5 MCG/ACT IN AERO: 2.0000 | INHALATION_SPRAY | Freq: Two times a day (BID) | RESPIRATORY_TRACT | 6 refills | Status: AC

## 2023-10-16 MED ORDER — ALBUTEROL SULFATE HFA 108 (90 BASE) MCG/ACT IN AERS: 1.0000 | INHALATION_SPRAY | Freq: Four times a day (QID) | RESPIRATORY_TRACT | 6 refills | Status: AC | PRN

## 2023-10-16 MED ORDER — PANTOPRAZOLE SODIUM 40 MG PO TBEC
40.0000 mg | DELAYED_RELEASE_TABLET | Freq: Every day | ORAL | 6 refills | Status: DC
Start: 2023-10-16 — End: 2024-01-25

## 2023-10-16 NOTE — Progress Notes (Signed)
 Subjective:  Patient ID: Felicia Perkins, female    DOB: 1976-01-30  Age: 48 y.o. MRN: [id number]  CC: Medical Management of Chronic Issues     Discussed the use of AI scribe software for clinical note transcription with the patient, who gave verbal consent to proceed.  History of Present Illness The patient, with a history of COPD, cirrhosis, and carotid artery stenosis, presents with palpitations. She describes the sensation as a rapid, hard heartbeat that resolves spontaneously. This symptom has been occurring since she recovered from a recent viral illness, which caused significant respiratory symptoms and a transient increase in blood pressure. The patient notes that her blood pressure has since returned to her baseline.  The patient also reports a decrease in smoking to 2-3 cigarettes per day and a decrease in alcohol consumption to 1-2 drinks on weekends. She has been attending counseling sessions and has found that talking about her concerns has been beneficial.  The patient continues to take famotidine and pantoprazole for acid reflux, and she has been purchasing the latter over the counter. She also takes [facility] for her COPD and has noticed an improvement in her breathing since her recent illness. However, she did require two breathing treatments to fully recover.  The patient has a history of carotid artery stenosis and has been taking Plavix and Crestor since undergoing stent placement. She has not seen her cardiologist, Dr. Carlena Sax, since before her surgery and is interested in scheduling a follow-up appointment due to her new onset palpitations.  The patient also has cirrhosis but denies any recent abdominal pain, swelling, or jaundice. She is due for a pneumonia vaccine and has agreed to receive it.    Past Medical History:  Diagnosis Date   Acute on chronic pancreatitis (HCC) 03/22/2019   Acute pancreatitis    Alcohol abuse    Alcohol dependence (HCC) 12/16/2012   Medical  detox successful    Alcohol induced acute pancreatitis 11/22/2017   Alcoholic cirrhosis of liver without ascites (HCC) 10/15/2016   Anemia    Anxiety    Aortic atherosclerosis (HCC)    Bipolar 1 disorder (HCC)    Cancer (HCC)    cervical cancer   Chest pain with moderate risk for cardiac etiology 12/15/2014   Cirrhosis (HCC)    COPD (chronic obstructive pulmonary disease) (HCC) 09/04/2016   Coronary artery disease    Depression    Duodenitis 11/22/2017   Eczema 10/09/2018   Elevated liver enzymes 12/22/2014   Fracture of femoral neck, left (HCC) 06/04/2022   Gastritis and gastroduodenitis 09/04/2016   GERD (gastroesophageal reflux disease)    Hip fracture (HCC) 06/03/2022   Nondisplaced fracture of neck of fifth metacarpal bone, right hand, initial encounter for closed fracture 08/26/2017   Pancreatitis    Peripheral vascular disease (HCC)    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 Early 20's. Normal   ENDARTERECTOMY Right 01/26/2021   Procedure: RIGHT CAROTID ARTERY ENDARTERECTOMY;  Surgeon: Maeola Harman, MD;  Location: [facility] OR;  Service: Vascular;  Laterality: Right;   ESOPHAGOGASTRODUODENOSCOPY (EGD) WITH PROPOFOL N/A 01/02/2018  Procedure: ESOPHAGOGASTRODUODENOSCOPY (EGD) WITH PROPOFOL;  Surgeon: [name], MD;  Location: [facility];  Service: Endoscopy;  Laterality: N/A;   EUS N/A 01/02/2018   Procedure: UPPER ENDOSCOPIC ULTRASOUND (EUS) RADIAL;  Surgeon: [name], MD;  Location: [facility];  Service: Endoscopy;  Laterality: N/A;   FOOT SURGERY     HIP PINNING,CANNULATED Left 06/05/2022    Procedure: PERCUTANEOUS FIXATION OF FEMORAL NECK;  Surgeon: Durene Romans, MD;  Location: [facility] OR;  Service: Orthopedics;  Laterality: Left;   OTHER SURGICAL HISTORY     ovarian surgery   TOTAL HIP ARTHROPLASTY Left 04/18/2023   Procedure: TOTAL HIP ARTHROPLASTY ANTERIOR APPROACH;  Surgeon: Durene Romans, MD;  Location: [facility] ORS;  Service: Orthopedics;  Laterality: Left;   TUBAL LIGATION      Family History  Problem Relation Age of Onset   Hypertension Mother    Breast cancer Mother 65   Pancreatitis Mother    Hypertension Father    Heart attack Cousin 30       died in 43s of MI   Breast cancer Maternal Aunt    Breast cancer Maternal Aunt    Colon cancer Neg Hx    Esophageal cancer Neg Hx    Rectal cancer Neg Hx    Stomach cancer Neg Hx     Social History   Socioeconomic History   Marital status: Single    Spouse name: Not on file   Number of children: 3   Years of education: Not on file   Highest education level: 12th grade  Occupational History   Occupation: homemaker  Tobacco Use   Smoking status: Every Day    Current packs/day: 0.25    Types: Cigarettes   Smokeless tobacco: Never   Tobacco comments:    5-6     cigarettes daily  Vaping Use   Vaping status: Never Used  Substance and Sexual Activity   Alcohol use: Not Currently    Comment: 2 glasses   Drug use: No   Sexual activity: Yes    Birth control/protection: Condom  Other Topics Concern   Not on file  Social History Narrative   Not on file   Social Drivers of Health   Financial Resource Strain: Medium Risk (10/16/2023)   Overall Financial Resource Strain (CARDIA)    Difficulty of Paying Living Expenses: Somewhat hard  Food Insecurity: Food Insecurity Present (10/16/2023)   Hunger Vital Sign    Worried About Running Out of Food in the Last Year: Sometimes true    Ran Out of Food in the Last Year: Sometimes true  Transportation Needs: No Transportation Needs (10/16/2023)   PRAPARE - Therapist, art (Medical): No    Lack of Transportation (Non-Medical): No  Physical Activity: Insufficiently Active (10/16/2023)   Exercise Vital Sign    Days of Exercise per Week: 4 days    Minutes of Exercise per Session: 30 min  Stress: Stress Concern Present (10/16/2023)   [employer] of Occupational Health - Occupational Stress Questionnaire    Feeling of Stress : To some extent  Social Connections: Socially Isolated (10/16/2023)   Social Connection and Isolation Panel [NHANES]    Frequency of Communication with Friends and Family: Once a week    Frequency of Social Gatherings with Friends and Family: Once a week    Attends Religious Services: More than 4 times per year    Active Member of [employer] or Organizations: No    Attends Club or  Organization Meetings: Not on file    Marital Status: Never married    No Known Allergies  Outpatient Medications Prior to Visit  Medication Sig Dispense Refill   albuterol (VENTOLIN HFA) 108 (90 Base) MCG/ACT inhaler Inhale 1-2 puffs into the lungs every 6 (six) hours as needed for wheezing or shortness of breath. 8.5 g 2   clopidogrel (PLAVIX) 75 MG tablet Take 1 tablet (75 mg total) by mouth daily. 30 tablet 11   famotidine (PEPCID) 20 MG tablet Take 1 tablet (20 mg total) by mouth 2 (two) times daily. 60 tablet 2   mometasone-formoterol (DULERA) 200-5 MCG/ACT AERO Inhale 2 puffs into the lungs 2 (two) times daily. (Patient taking differently: Inhale 2 puffs into the lungs 2 (two) times daily as needed for shortness of breath or wheezing.)     nicotine (NICODERM CQ - DOSED IN MG/24 HOURS) 14 mg/24hr patch Place 14 mg onto the skin daily as needed (nicotine cessation).     ondansetron (ZOFRAN) 4 MG tablet TAKE 1 TABLET BY MOUTH EVERY 8 HOURS AS NEEDED FOR NAUSEA AND VOMITING 18 tablet 1   OVER THE COUNTER MEDICATION Take 1 capsule by mouth daily. [place]     pantoprazole (PROTONIX) 40 MG tablet Take 1 tablet (40 mg total) by mouth daily. 30  tablet 6   polyethylene glycol (MIRALAX / GLYCOLAX) 17 g packet Take 17 g by mouth daily. (Patient taking differently: Take 17 g by mouth daily as needed for moderate constipation.) 14 each 0   rosuvastatin (CRESTOR) 10 MG tablet Take 1 tablet (10 mg total) by mouth daily. 30 tablet 11   triamcinolone cream (KENALOG) 0.1 % Apply 1 Application topically 2 (two) times daily. 45 g 0   senna (SENOKOT) 8.6 MG TABS tablet Take 2 tablets (17.2 mg total) by mouth at bedtime. 120 tablet 0   No facility-administered medications prior to visit.     ROS Review of Systems  Constitutional:  Negative for activity change and appetite change.  HENT:  Negative for sinus pressure and sore throat.   Respiratory:  Negative for chest tightness, shortness of breath and wheezing.   Cardiovascular:  Negative for chest pain and palpitations.  Gastrointestinal:  Negative for abdominal distention, abdominal pain and constipation.  Genitourinary: Negative.   Musculoskeletal: Negative.   Psychiatric/Behavioral:  Negative for behavioral problems and dysphoric mood.    *** Objective:  BP 104/68   Pulse 74   Ht 5\' 4"  (1.626 m)   Wt 103 lb 9.6 oz (47 kg)   SpO2 97%   BMI 17.78 kg/m      10/16/2023    2:28 PM 09/23/2023    6:07 PM 09/23/2023    4:24 PM  BP/Weight  Systolic BP 104 160 209  Diastolic BP 68 94 141  Wt. (Lbs) 103.6    BMI 17.78 kg/m2        Physical Exam Constitutional:      Appearance: She is well-developed.  Cardiovascular:     Rate and Rhythm: Normal rate.     Heart sounds: Normal heart sounds. No murmur heard. Pulmonary:     Effort: Pulmonary effort is normal.     Breath sounds: Normal breath sounds. No wheezing or rales.  Chest:     Chest wall: No tenderness.  Abdominal:     General: Bowel sounds are normal. There is no distension.     Palpations: Abdomen is soft. There is no mass.     Tenderness: There is no  abdominal tenderness.  Musculoskeletal:        General: Normal range  of motion.     Right lower leg: No edema.     Left lower leg: No edema.  Neurological:     Mental Status: She is alert and oriented to person, place, and time.  Psychiatric:        Mood and Affect: Mood normal.    ***    Latest Ref Rng & Units 04/19/2023    3:48 AM 04/08/2023    1:43 PM 03/13/2023    6:08 AM  CMP  Glucose 70 - 99 mg/dL 161  096  97   BUN 6 - 20 mg/dL 6  8  <5   Creatinine 0.45 - 1.00 mg/dL 4.09  8.11  9.14   Sodium 135 - 145 mmol/L 136  133  136   Potassium 3.5 - 5.1 mmol/L 3.8  3.8  4.1   Chloride 98 - 111 mmol/L 103  98  101   CO2 22 - 32 mmol/L 23  25  26    Calcium 8.9 - 10.3 mg/dL 8.8  9.3  9.0   Total Protein 6.5 - 8.1 g/dL   6.1   Total Bilirubin 0.3 - 1.2 mg/dL   1.0   Alkaline Phos 38 - 126 U/L   68   AST 15 - 41 U/L   23   ALT 0 - 44 U/L   7     Lipid Panel     Component Value Date/Time   CHOL 215 (H) 01/24/2022 1054   TRIG 232 (H) 01/24/2022 1054   HDL 79 11/06/2021 1046   CHOLHDL 3.7 03/22/2019 1428   VLDL 18 03/22/2019 1428   LDLCALC 83 11/06/2021 1046    CBC    Component Value Date/Time   WBC 16.0 (H) 04/19/2023 0348   RBC 3.31 (L) 04/19/2023 0348   HGB 11.3 (L) 04/19/2023 0348   HGB 12.6 11/06/2021 1046   HCT 33.9 (L) 04/19/2023 0348   HCT 38.7 11/06/2021 1046   PLT 189 04/19/2023 0348   PLT 244 11/06/2021 1046   MCV 102.4 (H) 04/19/2023 0348   MCV 101 (H) 11/06/2021 1046   MCH 34.1 (H) 04/19/2023 0348   MCHC 33.3 04/19/2023 0348   RDW 13.2 04/19/2023 0348   RDW 13.1 11/06/2021 1046   LYMPHSABS 1.1 06/05/2022 0347   LYMPHSABS 3.2 (H) 11/06/2021 1046   MONOABS 0.6 06/05/2022 0347   EOSABS 0.0 06/05/2022 0347   EOSABS 0.1 11/06/2021 1046   BASOSABS 0.0 06/05/2022 0347   BASOSABS 0.1 11/06/2021 1046    Lab Results  Component Value Date   HGBA1C 4.6 08/25/2019    Lab Results  Component Value Date   TSH 3.74 09/04/2016      Assessment and Plan Assessment & Plan Recent Viral Illness Recent viral illness with cough  and phlegm production, now resolved. Noted transient hypertension during illness. -No further action required at this time.  Tobacco Use Reduced to 2-3 cigarettes per day. -Continue to encourage smoking cessation.  Alcohol Use Reduced to 1-2 drinks on weekends, occasionally one drink during the week. -Continue to monitor and encourage moderation.  Gastroesophageal Reflux Disease (GERD) Currently on over-the-counter Pantoprazole and Famotidine from [facility]. -Continue Pantoprazole, patient to purchase over-the-counter.  Asthma/COPD Improved since recent viral illness. Currently on Dulera. -Continue Dulera. -Ensure patient has nebulizer and solution at home for acute exacerbations.  Carotid Artery Stenosis Status post stent placement, on lifelong Plavix. -Continue Plavix.  Hyperlipidemia On  Crestor. -Order cholesterol panel for next Friday morning, patient to fast.  Cardiac Palpitations New onset palpitations, particularly noticeable when standing still. -Order EKG and thyroid function tests. -Refer to cardiologist (Dr. Carlena Sax) for further evaluation.  Cirrhosis No current symptoms of abdominal pain, swelling, or jaundice. -Continue current management.  Depression/Anxiety Some days worse than others, but improved with support from mother and Bible study Runner, broadcasting/film/video. Still taking anxiety medication. -Continue current management and support.  Preventive Health Due for pneumonia vaccine due to high risk status (COPD, cirrhosis, smoker). -Administer pneumonia vaccine today.      No orders of the defined types were placed in this encounter.   Follow-up: Return in about 6 months (around 04/17/2024) for Chronic medical conditions.       Hoy Register, MD, FAAFP. [facility] [place], [place] [phone]   10/16/2023, 2:30 PM

## 2023-10-16 NOTE — Patient Instructions (Signed)
 VISIT SUMMARY:  During today's visit, we discussed your recent health concerns, including palpitations, and reviewed your ongoing management for COPD, cirrhosis, and carotid artery stenosis. We also talked about your progress in reducing smoking and alcohol consumption, and your current medications for acid reflux and anxiety.  YOUR PLAN:  -RECENT VIRAL ILLNESS: Your recent viral illness with cough and phlegm production has resolved, and no further action is required at this time.  -TOBACCO USE: You have reduced your smoking to 2-3 cigarettes per day. Please continue to work towards quitting smoking completely.  -ALCOHOL USE: You have reduced your alcohol consumption to 1-2 drinks on weekends, with occasional drinks during the week. Please continue to monitor and moderate your alcohol intake.  -GASTROESOPHAGEAL REFLUX DISEASE (GERD): You are currently managing your acid reflux with over-the-counter Pantoprazole and Famotidine. Please continue taking Pantoprazole as needed.  -ASTHMA/COPD: Your breathing has improved since your recent illness, and you are currently using Dulera. Please continue using Dulera and ensure you have a nebulizer and solution at home for any acute breathing issues.  -CAROTID ARTERY STENOSIS: You have had a stent placed in your carotid artery and are taking Plavix to prevent blood clots. Please continue taking Plavix as prescribed.  -HYPERLIPIDEMIA: You are taking Crestor to manage your cholesterol levels. We will order a cholesterol panel for you to complete next Friday morning, and you should fast before this test.  -CARDIAC PALPITATIONS: You have been experiencing new onset palpitations, especially when standing still. We will order an EKG and thyroid function tests, and refer you to your cardiologist, Dr. Carlena Sax, for further evaluation.  -CIRRHOSIS: You have cirrhosis but are not currently experiencing any symptoms like abdominal pain, swelling, or jaundice. Please  continue with your current management plan.  -DEPRESSION/ANXIETY: Your depression and anxiety have been better with support from your mother and Bible study teacher, and you are continuing your anxiety medication. Please continue with your current management and support.  -PREVENTIVE HEALTH: You are due for a pneumonia vaccine because of your high-risk status due to COPD, cirrhosis, and smoking. We will administer the pneumonia vaccine today.  INSTRUCTIONS:  Please schedule a follow-up appointment with your cardiologist, Dr. Carlena Sax, to evaluate your new onset palpitations. Additionally, complete the cholesterol panel next Friday morning, ensuring you fast before the test.  For more information, you can read your full clinical note, available in your patient portal.

## 2023-10-17 ENCOUNTER — Encounter: Payer: Self-pay | Admitting: Family Medicine

## 2023-10-18 ENCOUNTER — Ambulatory Visit: Payer: MEDICAID | Attending: Family Medicine

## 2023-10-18 DIAGNOSIS — Z9889 Other specified postprocedural states: Secondary | ICD-10-CM

## 2023-10-18 DIAGNOSIS — K7030 Alcoholic cirrhosis of liver without ascites: Secondary | ICD-10-CM

## 2023-10-18 DIAGNOSIS — R002 Palpitations: Secondary | ICD-10-CM

## 2023-10-19 LAB — CMP14+EGFR
ALT: 33 IU/L — ABNORMAL HIGH (ref 0–32)
AST: 42 IU/L — ABNORMAL HIGH (ref 0–40)
Albumin: 4.4 g/dL (ref 3.9–4.9)
Alkaline Phosphatase: 112 IU/L (ref 44–121)
BUN/Creatinine Ratio: 11 (ref 9–23)
BUN: 6 mg/dL (ref 6–24)
Bilirubin Total: 0.6 mg/dL (ref 0.0–1.2)
CO2: 27 mmol/L (ref 20–29)
Calcium: 9.8 mg/dL (ref 8.7–10.2)
Chloride: 98 mmol/L (ref 96–106)
Creatinine, Ser: 0.57 mg/dL (ref 0.57–1.00)
Globulin, Total: 2.6 g/dL (ref 1.5–4.5)
Glucose: 92 mg/dL (ref 70–99)
Potassium: 3.6 mmol/L (ref 3.5–5.2)
Sodium: 141 mmol/L (ref 134–144)
Total Protein: 7 g/dL (ref 6.0–8.5)
eGFR: 112 mL/min/{1.73_m2} (ref >59)

## 2023-10-19 LAB — LP+NON-HDL CHOLESTEROL
Cholesterol, Total: 199 mg/dL (ref 100–199)
HDL: 84 mg/dL (ref >39)
LDL Chol Calc (NIH): 96 mg/dL (ref 0–99)
Total Non-HDL-Chol (LDL+VLDL): 115 mg/dL (ref 0–129)
Triglycerides: 107 mg/dL (ref 0–149)
VLDL Cholesterol Cal: 19 mg/dL (ref 5–40)

## 2023-10-19 LAB — T4, FREE: Free T4: 0.83 ng/dL (ref 0.82–1.77)

## 2023-10-19 LAB — TSH: TSH: 2.24 u[IU]/mL (ref 0.450–4.500)

## 2023-10-19 LAB — T3: T3, Total: 122 ng/dL (ref 71–180)

## 2023-10-21 ENCOUNTER — Encounter: Payer: Self-pay | Admitting: Family Medicine

## 2023-10-28 ENCOUNTER — Encounter: Payer: Self-pay | Admitting: Family Medicine

## 2023-10-28 ENCOUNTER — Other Ambulatory Visit: Payer: Self-pay | Admitting: Family Medicine

## 2023-10-29 ENCOUNTER — Other Ambulatory Visit: Payer: Self-pay | Admitting: Family Medicine

## 2023-10-29 MED ORDER — NICOTINE 14 MG/24HR TD PT24: 14.0000 mg | MEDICATED_PATCH | Freq: Every day | TRANSDERMAL | 1 refills | Status: DC | PRN

## 2023-11-26 ENCOUNTER — Encounter: Payer: Self-pay | Admitting: Family Medicine

## 2023-12-09 ENCOUNTER — Other Ambulatory Visit: Payer: Self-pay | Admitting: Pharmacist

## 2023-12-09 ENCOUNTER — Encounter: Payer: Self-pay | Admitting: Family Medicine

## 2023-12-09 DIAGNOSIS — K2970 Gastritis, unspecified, without bleeding: Secondary | ICD-10-CM

## 2023-12-09 MED ORDER — FAMOTIDINE 20 MG PO TABS: 20.0000 mg | ORAL_TABLET | Freq: Two times a day (BID) | ORAL | 1 refills | Status: AC

## 2023-12-15 ENCOUNTER — Encounter (HOSPITAL_COMMUNITY): Payer: Self-pay | Admitting: Emergency Medicine

## 2023-12-15 ENCOUNTER — Ambulatory Visit (HOSPITAL_COMMUNITY)
Admission: EM | Admit: 2023-12-15 | Discharge: 2023-12-15 | Disposition: A | Discharge status: Home / Self Care | Payer: MEDICAID | Attending: Internal Medicine | Admitting: Internal Medicine

## 2023-12-15 DIAGNOSIS — K219 Gastro-esophageal reflux disease without esophagitis: Secondary | ICD-10-CM | POA: Diagnosis not present

## 2023-12-15 DIAGNOSIS — K2900 Acute gastritis without bleeding: Secondary | ICD-10-CM

## 2023-12-15 MED ORDER — ALUM & MAG HYDROXIDE-SIMETH 200-200-20 MG/5ML PO SUSP
30.0000 mL | Freq: Once | ORAL | Status: AC
  Administered 2023-12-15: 30 mL via ORAL

## 2023-12-15 MED ORDER — LIDOCAINE VISCOUS HCL 2 % MT SOLN
15.0000 mL | Freq: Once | OROMUCOSAL | Status: AC
  Administered 2023-12-15: 15 mL via OROMUCOSAL

## 2023-12-15 MED ORDER — LIDOCAINE VISCOUS HCL 2 % MT SOLN: 15.0000 mL | Freq: Four times a day (QID) | OROMUCOSAL | 0 refills | Status: DC | PRN

## 2023-12-15 MED ORDER — ONDANSETRON 4 MG PO TBDP: 4.0000 mg | ORAL_TABLET | Freq: Three times a day (TID) | ORAL | 0 refills | Status: DC | PRN

## 2023-12-15 MED ORDER — ALUM & MAG HYDROXIDE-SIMETH 200-200-20 MG/5ML PO SUSP
ORAL | Status: AC
  Filled 2023-12-15: qty 30

## 2023-12-15 MED ORDER — LIDOCAINE VISCOUS HCL 2 % MT SOLN
OROMUCOSAL | Status: AC
  Filled 2023-12-15: qty 15

## 2023-12-15 NOTE — ED Provider Notes (Signed)
 [facility]    CSN: [id number] Arrival date & time: 12/15/23  1050      History   Chief Complaint Chief Complaint  Patient presents with   Abdominal Pain    HPI Felicia Perkins is a 48 y.o. female.   48 year old female who presents urgent care with complaints of epigastric abdominal pain with nausea and some diarrhea.  This started approximately 2 days ago.  It did start right after she was eating jalapenos, pizza and alcoholic beverages.  She has also been out of her Pepcid  for several weeks and just got this refilled on Friday but has not taken it yet.  She denies any vomiting.  She did have diarrhea right after but took some Mylanta.  She now has more of constipation.  She denies any lower abdominal pain, dysuria, hematuria, bloody sputum, cough, fevers, chills.  She is able to drink fluids but has not been able to eat much due to the nausea and pain. She reports she has to be at work around 3 AM and does not want to miss going to work due to this.   Abdominal Pain Associated symptoms: constipation (mild currently), diarrhea (initally) and nausea   Associated symptoms: no chest pain, no chills, no cough, no dysuria, no fever, no hematuria, no shortness of breath, no sore throat and no vomiting     Past Medical History:  Diagnosis Date   Acute on chronic pancreatitis (HCC) 03/22/2019   Acute pancreatitis    Alcohol  abuse    Alcohol  dependence (HCC) 12/16/2012   Medical detox successful    Alcohol  induced acute pancreatitis 11/22/2017   Alcoholic cirrhosis of liver without ascites (HCC) 10/15/2016   Anemia    Anxiety    Aortic atherosclerosis (HCC)    Bipolar 1 disorder (HCC)    Cancer (HCC)    cervical cancer   Chest pain with moderate risk for cardiac etiology 12/15/2014   Cirrhosis (HCC)    COPD (chronic obstructive pulmonary disease) (HCC) 09/04/2016   Coronary artery disease    Depression    Duodenitis 11/22/2017   Eczema 10/09/2018   Elevated liver  enzymes 12/22/2014   Fracture of femoral neck, left (HCC) 06/04/2022   Gastritis and gastroduodenitis 09/04/2016   GERD (gastroesophageal reflux disease)    Hip fracture (HCC) 06/03/2022   Nondisplaced fracture of neck of fifth metacarpal bone, right hand, initial encounter for closed fracture 08/26/2017   Pancreatitis    Peripheral vascular disease (HCC)    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   PTSD (post-traumatic stress disorder)    Tobacco abuse 09/04/2016   Tobacco abuse counseling    Vascular disease     Patient Active Problem List   Diagnosis Date Noted   S/P total left hip arthroplasty 04/18/2023   Protein-calorie malnutrition, severe 03/13/2023   Lactic acidosis 03/13/2023   Alcoholic pancreatitis 03/11/2023   Acute on chronic pancreatitis (HCC) 03/10/2023   Intractable nausea and vomiting 10/30/2022   Malnutrition of moderate degree 06/05/2022   Essential (primary) hypertension 03/16/2021   Smoker 03/16/2021   History of carotid endarterectomy 03/16/2021   Coronary artery disease 03/02/2021   Depression 03/02/2021   Carotid artery stenosis 01/26/2021   Anxiety    Cancer (HCC)    Cirrhosis (HCC)    GERD (gastroesophageal reflux disease)    PTSD (post-traumatic stress disorder)    Vascular disease    Bipolar 1 disorder (HCC) 01/13/2019   Eczema 10/09/2018   Acute pancreatitis    Tobacco abuse counseling  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 Early 20's. Normal   ENDARTERECTOMY Right  01/26/2021   Procedure: RIGHT CAROTID ARTERY ENDARTERECTOMY;  Surgeon: Adine Hoof, MD;  Location: [facility] OR;  Service: Vascular;  Laterality: Right;   ESOPHAGOGASTRODUODENOSCOPY (EGD) WITH PROPOFOL  N/A 01/02/2018   Procedure: ESOPHAGOGASTRODUODENOSCOPY (EGD) WITH PROPOFOL ;  Surgeon: Janel Medford, MD;  Location: [facility];  Service: Endoscopy;  Laterality: N/A;   EUS N/A 01/02/2018   Procedure: UPPER ENDOSCOPIC ULTRASOUND (EUS) RADIAL;  Surgeon: Janel Medford, MD;  Location: [facility];  Service: Endoscopy;  Laterality: N/A;   FOOT SURGERY     HIP PINNING,CANNULATED Left 06/05/2022   Procedure: PERCUTANEOUS FIXATION OF FEMORAL NECK;  Surgeon: Claiborne Crew, MD;  Location: [facility] OR;  Service: Orthopedics;  Laterality: Left;   OTHER SURGICAL HISTORY     ovarian surgery   TOTAL HIP ARTHROPLASTY Left 04/18/2023   Procedure: TOTAL HIP ARTHROPLASTY ANTERIOR APPROACH;  Surgeon: Claiborne Crew, MD;  Location: [facility];  Service: Orthopedics;  Laterality: Left;   TUBAL LIGATION      OB History   No obstetric history on file.      Home Medications    Prior to Admission medications   Medication Sig Start Date End Date Taking? Authorizing Provider  albuterol  (VENTOLIN  HFA) 108 (90 Base) MCG/ACT inhaler Inhale 1-2 puffs into the lungs every 6 (six) hours as needed for wheezing or shortness of breath. 10/16/23  Yes Newlin, Enobong, MD  clopidogrel  (PLAVIX ) 75 MG tablet Take 1 tablet (75 mg total) by mouth daily. 04/11/23  Yes Tania Familia, NP  famotidine  (PEPCID ) 20 MG tablet Take 1 tablet (20 mg total) by mouth 2 (two) times daily. 12/09/23  Yes Newlin, Enobong, MD  mometasone -formoterol  (DULERA ) 200-5 MCG/ACT AERO Inhale 2 puffs into the lungs 2 (two) times daily. 10/16/23  Yes Newlin, Enobong, MD  nicotine  (NICODERM CQ  - DOSED IN MG/24 HOURS) 14 mg/24hr patch Place 1 patch (14 mg total) onto the skin daily as needed (nicotine  cessation). 10/29/23  Yes Newlin, Enobong, MD   ondansetron  (ZOFRAN ) 4 MG tablet TAKE 1 TABLET BY MOUTH EVERY 8 HOURS AS NEEDED FOR NAUSEA AND VOMITING 10/29/23  Yes Newlin, Enobong, MD  OVER THE COUNTER MEDICATION Take 1 capsule by mouth daily. [place]   Yes [provider]  pantoprazole  (PROTONIX ) 40 MG tablet Take 1 tablet (40 mg total) by mouth daily. 10/16/23  Yes Newlin, Enobong, MD  polyethylene glycol (MIRALAX  / GLYCOLAX ) 17 g packet Take 17 g by mouth daily. Patient taking differently: Take 17 g by mouth daily as needed for moderate constipation. 06/07/22  Yes Earnie Gola, PA-C  rosuvastatin  (CRESTOR ) 10 MG tablet Take 1 tablet (10 mg total) by mouth daily. 04/11/23  Yes Tania Familia, NP  triamcinolone  cream (KENALOG ) 0.1 % Apply 1 Application topically 2 (two) times daily. 08/23/23  Yes Newlin, Enobong, MD  senna (SENOKOT) 8.6 MG TABS tablet Take 2 tablets (17.2 mg total) by mouth at bedtime. 04/19/23  Earnie Gola, PA-C  cloNIDine  (CATAPRES ) 0.1 MG tablet Take 1 tablet (0.1 mg total) by mouth at bedtime. For hot flashes Patient not taking: Reported on 10/17/2019 01/13/19 08/15/20  Newlin, Enobong, MD    Family History Family History  Problem Relation Age of Onset   Hypertension Mother    Breast cancer Mother 97   Pancreatitis Mother    Hypertension Father    Heart attack Cousin 30       died in 68s of MI   Breast cancer Maternal Aunt    Breast cancer Maternal Aunt    Colon cancer Neg Hx    Esophageal cancer Neg Hx    Rectal cancer Neg Hx    Stomach cancer Neg Hx     Social History Social History   Tobacco Use   Smoking status: Every Day    Current packs/day: 0.25    Types: Cigarettes   Smokeless tobacco: Never   Tobacco comments:    5-6     cigarettes daily  Vaping Use   Vaping status: Never Used  Substance Use Topics   Alcohol  use: Yes    Comment: 2 glasses   Drug use: No     Allergies   Patient has no known allergies.   Review of Systems Review of Systems  Constitutional:   Positive for appetite change (Drinking fluids well). Negative for chills and fever.  HENT:  Negative for ear pain and sore throat.   Eyes:  Negative for pain and visual disturbance.  Respiratory:  Negative for cough and shortness of breath.   Cardiovascular:  Negative for chest pain and palpitations.  Gastrointestinal:  Positive for abdominal pain, constipation (mild currently), diarrhea (initally) and nausea. Negative for abdominal distention and vomiting.  Genitourinary:  Negative for dysuria and hematuria.  Musculoskeletal:  Negative for arthralgias and back pain.  Skin:  Negative for color change and rash.  Neurological:  Negative for seizures and syncope.  All other systems reviewed and are negative.    Physical Exam Triage Vital Signs ED Triage Vitals  Encounter Vitals Group     BP 12/15/23 1119 103/76     Systolic BP Percentile --      Diastolic BP Percentile --      Pulse Rate 12/15/23 1119 (!) 102     Resp 12/15/23 1119 16     Temp 12/15/23 1119 98.9 F (37.2 C)     Temp Source 12/15/23 1119 Oral     SpO2 12/15/23 1119 94 %     Weight 12/15/23 1120 111 lb (50.3 kg)     Height 12/15/23 1120 5\' 4"  (1.626 m)     Head Circumference --      Peak Flow --      Pain Score 12/15/23 1120 8     Pain Loc --      Pain Education --      Exclude from Growth Chart --    No data found.  Updated Vital Signs BP 103/76 (BP Location: Right Arm)   Pulse (!) 102   Temp 98.9 F (37.2 C) (Oral)   Resp 16   Ht 5\' 4"  (1.626 m)   Wt 111 lb (50.3 kg)   SpO2 94%   BMI 19.05 kg/m   Visual Acuity Right Eye Distance:   Left Eye Distance:   Bilateral Distance:    Right Eye Near:   Left Eye Near:    Bilateral Near:     Physical Exam Vitals and nursing note  reviewed.  Constitutional:      General: She is not in acute distress.    Appearance: She is well-developed.  HENT:     Head: Normocephalic and atraumatic.  Eyes:     Conjunctiva/sclera: Conjunctivae normal.   Cardiovascular:     Rate and Rhythm: Normal rate and regular rhythm.     Heart sounds: No murmur heard. Pulmonary:     Effort: Pulmonary effort is normal. No respiratory distress.     Breath sounds: Normal breath sounds.  Abdominal:     General: Abdomen is flat. There is no distension.     Palpations: Abdomen is soft.     Tenderness: There is abdominal tenderness in the epigastric area. There is no guarding or rebound. Negative signs include Murphy's sign.     Hernia: No hernia is present.  Musculoskeletal:        General: No swelling.     Cervical back: Neck supple.  Skin:    General: Skin is warm and dry.     Capillary Refill: Capillary refill takes less than 2 seconds.  Neurological:     Mental Status: She is alert.  Psychiatric:        Mood and Affect: Mood normal.      UC Treatments / Results  Labs (all labs ordered are listed, but only abnormal results are displayed) Labs Reviewed - No data to display  EKG   Radiology No results found.  Procedures Procedures (including critical care time)  Medications Ordered in UC Medications  lidocaine  (XYLOCAINE ) 2 % viscous mouth solution 15 mL (has no administration in time range)  alum & mag hydroxide-simeth (MAALOX/MYLANTA) 200-200-20 MG/5ML suspension 30 mL (has no administration in time range)    Initial Impression / Assessment and Plan / UC Course  I have reviewed the triage vital signs and the nursing notes.  Pertinent labs & imaging results that were available during my care of the patient were reviewed by me and considered in my medical decision making (see chart for details).     Other acute gastritis without hemorrhage  Gastroesophageal reflux disease without esophagitis   Epigastric Abdominal pain with nausea that has responded well to mylanta and lidocaine  suggesting this is likely an inflammation in the stomach called gastritis. Less likely is gastric ulcer given the presentation and history of  recently not taking Pepcid , however we did discuss that this is a possibility and she may need evaluation for this if she continues to have significant symptoms.  GI cocktail given today with improvement in symptoms.  Restart Pepcid  40 mg daily Will send home with lidocaine  liquid 15 mLs to be mix with 15 mLs of Mylanta and drink. May use this every 6 hours as needed.  Advised the patient to not eat or drink for 45 minutes after taking this medication.  Zofran  4 mg orally disintegrating tablet every 8 hours as needed for nausea.  Patient declined a dose here. If symptoms worsen or not controlled with these medications then would return for evaluation Advised the patient that if she begins to cough up any blood or the pain is more severe, then go to the ER immediately.  Return to urgent care or PCP as needed   Final Clinical Impressions(s) / UC Diagnoses   Final diagnoses:  None   Discharge Instructions   None    ED Prescriptions   None    PDMP not reviewed this encounter.   Kreg Pesa, [place] 12/15/23 1238

## 2023-12-15 NOTE — Discharge Instructions (Addendum)
 Epigastric Abdominal pain with nausea that has responded well to mylanta and lidocaine  suggesting this is likely an inflammation in the stomach called gastritis. This may be due to recent spicy food intake as well as being off of your normal medication for acid reflux.  GI cocktail given today with improvement in symptoms.  Restart Pepcid  40 mg daily Lidocaine  liquid 15 mLs to be mix with 15 mLs of Mylanta and drink. May use this every 6 hours as needed. Do not eat or drink for 45 minutes after taking this medication.  Zofran  4 mg orally disintegrating tablet every 8 hours as needed for nausea.  If symptoms worsen or not controlled with these medications then would return for evaluation If you cough up any blood or the pain is more severe, then go to the ER immediately.  Return to urgent care or PCP as needed

## 2023-12-15 NOTE — ED Triage Notes (Signed)
 Patient c/o upper abdominal pain x 2-3 days, nausea and diarrhea.  Patient states she did have some spicy jalapeno food and drinks x 2 days.  Pain started after then.  Patient has taken Mylanta.

## 2023-12-16 ENCOUNTER — Other Ambulatory Visit: Payer: Self-pay

## 2023-12-16 ENCOUNTER — Emergency Department (HOSPITAL_COMMUNITY): Payer: MEDICAID

## 2023-12-16 ENCOUNTER — Emergency Department (HOSPITAL_COMMUNITY)
Admission: EM | Admit: 2023-12-16 | Discharge: 2023-12-16 | Disposition: A | Discharge status: Home / Self Care | Payer: MEDICAID | Attending: Emergency Medicine | Admitting: Emergency Medicine

## 2023-12-16 ENCOUNTER — Encounter (HOSPITAL_COMMUNITY): Payer: Self-pay

## 2023-12-16 DIAGNOSIS — J449 Chronic obstructive pulmonary disease, unspecified: Secondary | ICD-10-CM | POA: Insufficient documentation

## 2023-12-16 DIAGNOSIS — R197 Diarrhea, unspecified: Secondary | ICD-10-CM | POA: Insufficient documentation

## 2023-12-16 DIAGNOSIS — Z8541 Personal history of malignant neoplasm of cervix uteri: Secondary | ICD-10-CM | POA: Insufficient documentation

## 2023-12-16 DIAGNOSIS — I2510 Atherosclerotic heart disease of native coronary artery without angina pectoris: Secondary | ICD-10-CM | POA: Diagnosis not present

## 2023-12-16 DIAGNOSIS — M899 Disorder of bone, unspecified: Secondary | ICD-10-CM | POA: Insufficient documentation

## 2023-12-16 DIAGNOSIS — R112 Nausea with vomiting, unspecified: Secondary | ICD-10-CM | POA: Insufficient documentation

## 2023-12-16 DIAGNOSIS — R1013 Epigastric pain: Secondary | ICD-10-CM | POA: Diagnosis present

## 2023-12-16 DIAGNOSIS — Z720 Tobacco use: Secondary | ICD-10-CM | POA: Insufficient documentation

## 2023-12-16 LAB — URINALYSIS, ROUTINE W REFLEX MICROSCOPIC
Bacteria, UA: NONE SEEN
Bilirubin Urine: NEGATIVE
Glucose, UA: NEGATIVE mg/dL
Hgb urine dipstick: NEGATIVE
Ketones, ur: NEGATIVE mg/dL
Nitrite: NEGATIVE
Protein, ur: NEGATIVE mg/dL
Specific Gravity, Urine: 1.006 (ref 1.005–1.030)
pH: 9 — ABNORMAL HIGH (ref 5.0–8.0)

## 2023-12-16 LAB — COMPREHENSIVE METABOLIC PANEL WITH GFR
ALT: 15 U/L (ref 0–44)
AST: 23 U/L (ref 15–41)
Albumin: 3.6 g/dL (ref 3.5–5.0)
Alkaline Phosphatase: 75 U/L (ref 38–126)
Anion gap: 8 (ref 5–15)
BUN: 5 mg/dL — ABNORMAL LOW (ref 6–20)
CO2: 27 mmol/L (ref 22–32)
Calcium: 9 mg/dL (ref 8.9–10.3)
Chloride: 98 mmol/L (ref 98–111)
Creatinine, Ser: 0.54 mg/dL (ref 0.44–1.00)
GFR, Estimated: >60 mL/min (ref >60)
Glucose, Bld: 100 mg/dL — ABNORMAL HIGH (ref 70–99)
Potassium: 3.5 mmol/L (ref 3.5–5.1)
Sodium: 133 mmol/L — ABNORMAL LOW (ref 135–145)
Total Bilirubin: 0.9 mg/dL (ref 0.0–1.2)
Total Protein: 7 g/dL (ref 6.5–8.1)

## 2023-12-16 LAB — CBC
HCT: 36.7 % (ref 36.0–46.0)
Hemoglobin: 12.5 g/dL (ref 12.0–15.0)
MCH: 33.2 pg (ref 26.0–34.0)
MCHC: 34.1 g/dL (ref 30.0–36.0)
MCV: 97.6 fL (ref 80.0–100.0)
Platelets: 244 10*3/uL (ref 150–400)
RBC: 3.76 MIL/uL — ABNORMAL LOW (ref 3.87–5.11)
RDW: 13.1 % (ref 11.5–15.5)
WBC: 7.7 10*3/uL (ref 4.0–10.5)
nRBC: 0 % (ref 0.0–0.2)

## 2023-12-16 LAB — LIPASE, BLOOD: Lipase: 30 U/L (ref 11–51)

## 2023-12-16 LAB — HCG, SERUM, QUALITATIVE: Preg, Serum: NEGATIVE

## 2023-12-16 MED ORDER — MORPHINE SULFATE (PF) 4 MG/ML IV SOLN
4.0000 mg | Freq: Once | INTRAVENOUS | Status: AC
  Administered 2023-12-16: 4 mg via INTRAVENOUS
  Filled 2023-12-16: qty 1

## 2023-12-16 MED ORDER — SODIUM CHLORIDE 0.9 % IV BOLUS
1000.0000 mL | Freq: Once | INTRAVENOUS | Status: AC
  Administered 2023-12-16: 1000 mL via INTRAVENOUS

## 2023-12-16 MED ORDER — DICYCLOMINE HCL 20 MG PO TABS: 20.0000 mg | ORAL_TABLET | Freq: Three times a day (TID) | ORAL | 0 refills | Status: DC | PRN

## 2023-12-16 MED ORDER — PANTOPRAZOLE SODIUM 40 MG IV SOLR
40.0000 mg | Freq: Once | INTRAVENOUS | Status: AC
  Administered 2023-12-16: 40 mg via INTRAVENOUS
  Filled 2023-12-16: qty 10

## 2023-12-16 MED ORDER — IOHEXOL 300 MG/ML  SOLN
100.0000 mL | Freq: Once | INTRAMUSCULAR | Status: AC | PRN
  Administered 2023-12-16: 100 mL via INTRAVENOUS

## 2023-12-16 MED ORDER — ONDANSETRON HCL 4 MG/2ML IJ SOLN
4.0000 mg | Freq: Once | INTRAMUSCULAR | Status: AC
  Administered 2023-12-16: 4 mg via INTRAVENOUS
  Filled 2023-12-16: qty 2

## 2023-12-16 NOTE — ED Notes (Signed)
 Pt tolerated 1 pack of saltine crackers, some ginger ale, reports some discomfort after eating that shortly resolved, no issue reported with drinking, denies nausea. Dr. Dolan Freiberg made aware.

## 2023-12-16 NOTE — ED Provider Notes (Signed)
 Emergency Department Provider Note   I have reviewed the triage vital signs and the nursing notes.   HISTORY  Chief Complaint Abdominal Pain   HPI Felicia Perkins is a 48 y.o. female with past.  Of acute pancreatitis and alcohol  use presents to the emergency department with epigastric abdominal pain and vomiting.  Patient states that she was celebrating with family and did have a couple of drinks along with eating some spicy food.  She began having epigastric pain with some vomiting.  No fevers or chills.  She initially went to urgent care yesterday and was feeling better after GI cocktail.  She went home but pain continued/returned and so after not abating today she presents for evaluation. No CP or SOB.  No hematemesis.  She has had some associated diarrhea without blood.   Past Medical History:  Diagnosis Date   Acute on chronic pancreatitis (HCC) 03/22/2019   Acute pancreatitis    Alcohol  abuse    Alcohol  dependence (HCC) 12/16/2012   Medical detox successful    Alcohol  induced acute pancreatitis 11/22/2017   Alcoholic cirrhosis of liver without ascites (HCC) 10/15/2016   Anemia    Anxiety    Aortic atherosclerosis (HCC)    Bipolar 1 disorder (HCC)    Cancer (HCC)    cervical cancer   Chest pain with moderate risk for cardiac etiology 12/15/2014   Cirrhosis (HCC)    COPD (chronic obstructive pulmonary disease) (HCC) 09/04/2016   Coronary artery disease    Depression    Duodenitis 11/22/2017   Eczema 10/09/2018   Elevated liver enzymes 12/22/2014   Fracture of femoral neck, left (HCC) 06/04/2022   Gastritis and gastroduodenitis 09/04/2016   GERD (gastroesophageal reflux disease)    Hip fracture (HCC) 06/03/2022   Nondisplaced fracture of neck of fifth metacarpal bone, right hand, initial encounter for closed fracture 08/26/2017   Pancreatitis    Peripheral vascular disease (HCC)    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 Respiratory: Denies shortness of breath. Gastrointestinal: Positive epigastric abdominal pain.  Positive nausea, vomiting, and diarrhea.  No constipation. Musculoskeletal: Negative for back pain. Skin: Negative for rash. Neurological: Negative for headaches.  ____________________________________________   PHYSICAL EXAM:  VITAL SIGNS: ED Triage Vitals [12/16/23 1343]  Encounter Vitals Group     BP 138/84     Pulse Rate 80     Resp 18     Temp 98.3 F (36.8 C)     Temp Source Oral     SpO2 99 %     Weight 111 lb (50.3 kg)     Height 5\' 4"  (1.626 m)   Constitutional: Alert and oriented. Well appearing and in no acute distress. Eyes: Conjunctivae are normal.  Head: Atraumatic. Nose: No congestion/rhinnorhea. Mouth/Throat: Mucous membranes are moist.  Neck: No stridor.   Cardiovascular: Normal rate, regular rhythm. Good peripheral circulation. Grossly normal heart sounds.   Respiratory: Normal respiratory effort.  No retractions. Lungs CTAB. Gastrointestinal: Soft with focal mid-epigastric tenderness. No peritonitis. No distention.  Musculoskeletal: No gross deformities of extremities. Neurologic:  Normal speech and language. Skin:  Skin is warm, dry and intact. No rash noted.  ____________________________________________   LABS (all labs ordered are listed, but only abnormal results are displayed)  Labs Reviewed  COMPREHENSIVE METABOLIC PANEL WITH  GFR - Abnormal; Notable for the following components:      Result Value   Sodium 133 (*)    Glucose, Bld 100 (*)    BUN 5 (*)    All other components within normal limits  CBC - Abnormal; Notable for the following components:   RBC 3.76 (*)    All other components within normal limits  URINALYSIS, ROUTINE W REFLEX MICROSCOPIC - Abnormal; Notable for the  following components:   pH 9.0 (*)    Leukocytes,Ua MODERATE (*)    All other components within normal limits  LIPASE, BLOOD  HCG, SERUM, QUALITATIVE   ____________________________________________  RADIOLOGY  No results found.  ____________________________________________   PROCEDURES  Procedure(s) performed:   Procedures   ____________________________________________   INITIAL IMPRESSION / ASSESSMENT AND PLAN / ED COURSE  Pertinent labs & imaging results that were available during my care of the patient were reviewed by me and considered in my medical decision making (see chart for details).   This patient is Presenting for Evaluation of abdominal pain, which does require a range of treatment options, and is a complaint that involves a high risk of morbidity and mortality.  The Differential Diagnoses includes but is not exclusive to acute cholecystitis, intrathoracic causes for epigastric abdominal pain, gastritis, duodenitis, pancreatitis, small bowel or large bowel obstruction, abdominal aortic aneurysm, hernia, gastritis, etc.   Critical Interventions-    Medications - No data to display  Reassessment after intervention:     I *** Additional Historical Information from ***, as the patient is ***.  I decided to review pertinent External Data, and in summary ***.   Clinical Laboratory Tests Ordered, included   Radiologic Tests Ordered, included ***. I independently interpreted the images and agree with radiology interpretation.   Cardiac Monitor Tracing which shows ***   Social Determinants of Health Risk ***  Consult complete with  Medical Decision Making: Summary: ***  Reevaluation with update and discussion with   ***Considered admission***  Patient's presentation is most consistent with {EM COPA:27473}   Disposition:   ____________________________________________  FINAL CLINICAL IMPRESSION(S) / ED DIAGNOSES  Final diagnoses:  None      NEW OUTPATIENT MEDICATIONS STARTED DURING THIS VISIT:  New Prescriptions   No medications on file    Note:  This document was prepared using Dragon voice recognition software and may include unintentional dictation errors.  Abby Hocking, MD, [facility]

## 2023-12-16 NOTE — ED Triage Notes (Signed)
 Pt diagnosed with gastritis yesterday, no relief with at home meds prescribed. Last episode of vomiting yesterday. Diarrhea today and nausea.

## 2023-12-16 NOTE — Discharge Instructions (Signed)
 You have some inflammation in your stomach and small intestine.  I have called in some medications to help with your pain symptoms.  Please continue your acid reducing medicines and nausea medications at home.  Drink mainly fluids until you are feeling better and can drink/eat more substantial food.

## 2023-12-16 NOTE — ED Provider Triage Note (Signed)
 Emergency Medicine Provider Triage Evaluation Note  JOVIA HENRETTY , a 48 y.o. female  was evaluated in triage.  Pt complains of epigastric pain that started yesterday.  Was seen at urgent care and states the viscous lidocaine  she was given did help with symptoms, but has not resolved them.  Also reports an episode of vomiting last night.  Denies any fever or chills.  Review of Systems  Positive: As above Negative: As above  Physical Exam  BP 138/84   Pulse 80   Temp 98.3 F (36.8 C) (Oral)   Resp 18   Ht 5\' 4"  (1.626 m)   Wt 50.3 kg   SpO2 99%   BMI 19.05 kg/m  Gen:   Awake, no distress   Resp:  Normal effort  MSK:   Moves extremities without difficulty    Medical Decision Making  Medically screening exam initiated at 1:57 PM.  Appropriate orders placed.  Shakeya TERRAN DIMITRIOU was informed that the remainder of the evaluation will be completed by another provider, this initial triage assessment does not replace that evaluation, and the importance of remaining in the ED until their evaluation is complete.     Rexie Catena, PA-C 12/16/23 1439

## 2023-12-28 ENCOUNTER — Encounter: Payer: Self-pay | Admitting: Family Medicine

## 2023-12-31 ENCOUNTER — Other Ambulatory Visit: Payer: Self-pay | Admitting: Family Medicine

## 2024-01-03 ENCOUNTER — Other Ambulatory Visit: Payer: Self-pay | Admitting: *Deleted

## 2024-01-03 DIAGNOSIS — I6523 Occlusion and stenosis of bilateral carotid arteries: Secondary | ICD-10-CM

## 2024-01-10 ENCOUNTER — Encounter: Payer: Self-pay | Admitting: Family Medicine

## 2024-01-22 ENCOUNTER — Ambulatory Visit (HOSPITAL_COMMUNITY): Admission: RE | Admit: 2024-01-22 | Payer: MEDICAID | Source: Ambulatory Visit

## 2024-01-22 ENCOUNTER — Encounter (HOSPITAL_COMMUNITY): Payer: Self-pay

## 2024-01-22 ENCOUNTER — Ambulatory Visit: Payer: MEDICAID | Attending: Vascular Surgery

## 2024-01-25 ENCOUNTER — Emergency Department (HOSPITAL_COMMUNITY)
Admission: EM | Admit: 2024-01-25 | Discharge: 2024-01-25 | Disposition: A | Discharge status: Home / Self Care | Payer: MEDICAID | Attending: Emergency Medicine | Admitting: Emergency Medicine

## 2024-01-25 ENCOUNTER — Encounter (HOSPITAL_COMMUNITY): Payer: Self-pay

## 2024-01-25 ENCOUNTER — Other Ambulatory Visit: Payer: Self-pay

## 2024-01-25 ENCOUNTER — Emergency Department (HOSPITAL_COMMUNITY): Payer: MEDICAID

## 2024-01-25 DIAGNOSIS — R109 Unspecified abdominal pain: Secondary | ICD-10-CM

## 2024-01-25 DIAGNOSIS — F17200 Nicotine dependence, unspecified, uncomplicated: Secondary | ICD-10-CM | POA: Diagnosis not present

## 2024-01-25 DIAGNOSIS — I2510 Atherosclerotic heart disease of native coronary artery without angina pectoris: Secondary | ICD-10-CM | POA: Insufficient documentation

## 2024-01-25 DIAGNOSIS — Z7901 Long term (current) use of anticoagulants: Secondary | ICD-10-CM | POA: Diagnosis not present

## 2024-01-25 DIAGNOSIS — Z8541 Personal history of malignant neoplasm of cervix uteri: Secondary | ICD-10-CM | POA: Diagnosis not present

## 2024-01-25 DIAGNOSIS — J449 Chronic obstructive pulmonary disease, unspecified: Secondary | ICD-10-CM | POA: Insufficient documentation

## 2024-01-25 DIAGNOSIS — K7460 Unspecified cirrhosis of liver: Secondary | ICD-10-CM | POA: Diagnosis not present

## 2024-01-25 DIAGNOSIS — E876 Hypokalemia: Secondary | ICD-10-CM | POA: Insufficient documentation

## 2024-01-25 LAB — URINALYSIS, W/ REFLEX TO CULTURE (INFECTION SUSPECTED)
Bacteria, UA: NONE SEEN
Bilirubin Urine: NEGATIVE
Glucose, UA: NEGATIVE mg/dL
Hgb urine dipstick: NEGATIVE
Ketones, ur: NEGATIVE mg/dL
Leukocytes,Ua: NEGATIVE
Nitrite: NEGATIVE
Protein, ur: NEGATIVE mg/dL
Specific Gravity, Urine: 1.026 (ref 1.005–1.030)
pH: 6 (ref 5.0–8.0)

## 2024-01-25 LAB — COMPREHENSIVE METABOLIC PANEL WITH GFR
ALT: 10 U/L (ref 0–44)
AST: 28 U/L (ref 15–41)
Albumin: 3.2 g/dL — ABNORMAL LOW (ref 3.5–5.0)
Alkaline Phosphatase: 76 U/L (ref 38–126)
Anion gap: 13 (ref 5–15)
BUN: <5 mg/dL — ABNORMAL LOW (ref 6–20)
CO2: 26 mmol/L (ref 22–32)
Calcium: 9.4 mg/dL (ref 8.9–10.3)
Chloride: 98 mmol/L (ref 98–111)
Creatinine, Ser: 0.61 mg/dL (ref 0.44–1.00)
GFR, Estimated: >60 mL/min (ref >60)
Glucose, Bld: 100 mg/dL — ABNORMAL HIGH (ref 70–99)
Potassium: 3.2 mmol/L — ABNORMAL LOW (ref 3.5–5.1)
Sodium: 137 mmol/L (ref 135–145)
Total Bilirubin: 0.6 mg/dL (ref 0.0–1.2)
Total Protein: 6.7 g/dL (ref 6.5–8.1)

## 2024-01-25 LAB — CBC WITH DIFFERENTIAL/PLATELET
Abs Immature Granulocytes: 0.02 10*3/uL (ref 0.00–0.07)
Basophils Absolute: 0.1 10*3/uL (ref 0.0–0.1)
Basophils Relative: 1 %
Eosinophils Absolute: 0.2 10*3/uL (ref 0.0–0.5)
Eosinophils Relative: 2 %
HCT: 44.5 % (ref 36.0–46.0)
Hemoglobin: 14.9 g/dL (ref 12.0–15.0)
Immature Granulocytes: 0 %
Lymphocytes Relative: 56 %
Lymphs Abs: 5.2 10*3/uL — ABNORMAL HIGH (ref 0.7–4.0)
MCH: 32.6 pg (ref 26.0–34.0)
MCHC: 33.5 g/dL (ref 30.0–36.0)
MCV: 97.4 fL (ref 80.0–100.0)
Monocytes Absolute: 0.6 10*3/uL (ref 0.1–1.0)
Monocytes Relative: 7 %
Neutro Abs: 3.1 10*3/uL (ref 1.7–7.7)
Neutrophils Relative %: 34 %
Platelets: 306 10*3/uL (ref 150–400)
RBC: 4.57 MIL/uL (ref 3.87–5.11)
RDW: 12.6 % (ref 11.5–15.5)
WBC: 9.1 10*3/uL (ref 4.0–10.5)
nRBC: 0 % (ref 0.0–0.2)

## 2024-01-25 LAB — LIPASE, BLOOD: Lipase: 33 U/L (ref 11–51)

## 2024-01-25 MED ORDER — ONDANSETRON 4 MG PO TBDP: 4.0000 mg | ORAL_TABLET | Freq: Three times a day (TID) | ORAL | 0 refills | Status: DC | PRN

## 2024-01-25 MED ORDER — ONDANSETRON HCL 4 MG/2ML IJ SOLN
4.0000 mg | Freq: Once | INTRAMUSCULAR | Status: AC
  Administered 2024-01-25: 4 mg via INTRAVENOUS
  Filled 2024-01-25: qty 2

## 2024-01-25 MED ORDER — PANTOPRAZOLE SODIUM 20 MG PO TBEC: 40.0000 mg | DELAYED_RELEASE_TABLET | Freq: Every day | ORAL | 0 refills | Status: DC

## 2024-01-25 MED ORDER — DICYCLOMINE HCL 10 MG/ML IM SOLN
20.0000 mg | Freq: Once | INTRAMUSCULAR | Status: AC
  Administered 2024-01-25: 20 mg via INTRAMUSCULAR
  Filled 2024-01-25: qty 2

## 2024-01-25 MED ORDER — IOHEXOL 350 MG/ML SOLN
75.0000 mL | Freq: Once | INTRAVENOUS | Status: AC | PRN
  Administered 2024-01-25: 75 mL via INTRAVENOUS

## 2024-01-25 MED ORDER — LACTATED RINGERS IV BOLUS
1000.0000 mL | Freq: Once | INTRAVENOUS | Status: AC
  Administered 2024-01-25: 1000 mL via INTRAVENOUS

## 2024-01-25 MED ORDER — DICYCLOMINE HCL 20 MG PO TABS: 20.0000 mg | ORAL_TABLET | Freq: Two times a day (BID) | ORAL | 0 refills | Status: DC

## 2024-01-25 MED ORDER — POTASSIUM CHLORIDE CRYS ER 20 MEQ PO TBCR
40.0000 meq | EXTENDED_RELEASE_TABLET | Freq: Once | ORAL | Status: AC
  Administered 2024-01-25: 40 meq via ORAL
  Filled 2024-01-25: qty 2

## 2024-01-25 MED ORDER — MORPHINE SULFATE (PF) 4 MG/ML IV SOLN
4.0000 mg | Freq: Once | INTRAVENOUS | Status: AC
  Administered 2024-01-25: 4 mg via INTRAVENOUS
  Filled 2024-01-25: qty 1

## 2024-01-25 NOTE — ED Triage Notes (Signed)
 Abdominal pain for the past four days. Also having constipation for the two weeks. States when she does goes only white mucous comes out. Pt has been taking stool softeners and Miralax .

## 2024-01-25 NOTE — ED Notes (Signed)
 Please give an update [phone] Felicia Perkins significant other

## 2024-01-25 NOTE — ED Provider Notes (Signed)
 [place] EMERGENCY DEPARTMENT AT [facility] Provider Note   CSN: [id number] Arrival date & time: 01/25/24  7829     Patient presents with: Abdominal Pain   Felicia Perkins is a 48 y.o. female.  {Add pertinent medical, surgical, social history, OB history to HPI:32947} HPI     Prior to Admission medications   Medication Sig Start Date End Date Taking? Authorizing Provider  albuterol  (VENTOLIN  HFA) 108 (90 Base) MCG/ACT inhaler Inhale 1-2 puffs into the lungs every 6 (six) hours as needed for wheezing or shortness of breath. 10/16/23   Newlin, Enobong, MD  clopidogrel  (PLAVIX ) 75 MG tablet Take 1 tablet (75 mg total) by mouth daily. 04/11/23   Tania Familia, NP  dicyclomine  (BENTYL ) 20 MG tablet Take 1 tablet (20 mg total) by mouth 3 (three) times daily as needed. 12/16/23   Long, Shereen Dike, MD  famotidine  (PEPCID ) 20 MG tablet Take 1 tablet (20 mg total) by mouth 2 (two) times daily. 12/09/23   Newlin, Enobong, MD  lidocaine  (XYLOCAINE ) 2 % solution Use as directed 15 mLs in the mouth or throat every 6 (six) hours as needed (Severe acid reflux symptoms). Mix with 15 mLs of Mylanta and drink. Do not eat or drink for 45 minutes after taking this medication. 12/15/23   White, Elizabeth A, PA-C  mometasone -formoterol  (DULERA ) 200-5 MCG/ACT AERO Inhale 2 puffs into the lungs 2 (two) times daily. 10/16/23   Newlin, Enobong, MD  nicotine  (NICODERM CQ  - DOSED IN MG/24 HOURS) 14 mg/24hr patch Place 1 patch (14 mg total) onto the skin daily as needed (nicotine  cessation). 10/29/23   Newlin, Enobong, MD  ondansetron  (ZOFRAN ) 4 MG tablet TAKE 1 TABLET BY MOUTH EVERY 8 HOURS AS NEEDED FOR NAUSEA AND VOMITING 12/31/23   Newlin, Enobong, MD  OVER THE COUNTER MEDICATION Take 1 capsule by mouth daily. [employer], Historical, MD  pantoprazole  (PROTONIX ) 40 MG tablet Take 1 tablet (40 mg total) by mouth daily. 10/16/23   Newlin, Enobong, MD  polyethylene glycol (MIRALAX  / GLYCOLAX ) 17 g  packet Take 17 g by mouth daily. Patient taking differently: Take 17 g by mouth daily as needed for moderate constipation. 06/07/22   Earnie Gola, PA-C  rosuvastatin  (CRESTOR ) 10 MG tablet Take 1 tablet (10 mg total) by mouth daily. 04/11/23   Tania Familia, NP  senna (SENOKOT) 8.6 MG TABS tablet Take 2 tablets (17.2 mg total) by mouth at bedtime. 04/19/23   Earnie Gola, PA-C  triamcinolone  cream (KENALOG ) 0.1 % APPLY TO AFFECTED AREA TWICE A DAY 12/31/23   Newlin, Enobong, MD  cloNIDine  (CATAPRES ) 0.1 MG tablet Take 1 tablet (0.1 mg total) by mouth at bedtime. For hot flashes Patient not taking: Reported on 10/17/2019 01/13/19 08/15/20  Newlin, Enobong, MD    Allergies: Patient has no known allergies.    Review of Systems  Updated Vital Signs BP (!) 143/87   Pulse 62   Temp 97.9 F (36.6 C) (Oral)   Resp (!) 23   SpO2 100%   Physical Exam  (all labs ordered are listed, but only abnormal results are displayed) Labs Reviewed  CBC WITH DIFFERENTIAL/PLATELET - Abnormal; Notable for the following components:      Result Value   Lymphs Abs 5.2 (*)    All other components within normal limits  COMPREHENSIVE METABOLIC PANEL WITH GFR - Abnormal; Notable for the following components:   Potassium 3.2 (*)    Glucose, Bld  100 (*)    BUN <5 (*)    Albumin 3.2 (*)    All other components within normal limits  URINALYSIS, W/ REFLEX TO CULTURE (INFECTION SUSPECTED) - Abnormal; Notable for the following components:   Color, Urine AMBER (*)    APPearance HAZY (*)    All other components within normal limits  LIPASE, BLOOD    EKG: None  Radiology: US  Abdomen Limited RUQ (LIVER/GB) Result Date: 01/25/2024 CLINICAL DATA:  [id number] RUQ abdominal pain [id number] EXAM: ULTRASOUND ABDOMEN LIMITED RIGHT UPPER QUADRANT COMPARISON:  CT abdomen pelvis 01/25/2024, ultrasound abdomen 12/11/2021 FINDINGS: Gallbladder: Stable to slightly decreased in size 0.4 cm (from 0.6 cm) gallbladder polyp-no  further follow-up indicated. No gallstones or wall thickening visualized. No sonographic Murphy sign noted by sonographer. Common bile duct: Diameter: 2 mm Liver: Nodular hepatic contour. No focal lesion identified. Within normal limits in parenchymal echogenicity. Portal vein is patent on color Doppler imaging with normal direction of blood flow towards the liver. Other: None. IMPRESSION: Cirrhosis. No focal liver lesions identified. Please note that liver protocol enhanced MR and CT are the most sensitive tests for the screening detection of hepatocellular carcinoma in the high risk setting of cirrhosis. Electronically Signed   By: Morgane  Naveau M.D.   On: 01/25/2024 14:49   CT ABDOMEN PELVIS W CONTRAST Result Date: 01/25/2024 CLINICAL DATA:  Abdominal pain, acute, nonlocalized bowel obstruction? EXAM: CT ABDOMEN AND PELVIS WITH CONTRAST TECHNIQUE: Multidetector CT imaging of the abdomen and pelvis was performed using the standard protocol following bolus administration of intravenous contrast. RADIATION DOSE REDUCTION: This exam was performed according to the departmental dose-optimization program which includes automated exposure control, adjustment of the mA and/or kV according to patient size and/or use of iterative reconstruction technique. CONTRAST:  75mL OMNIPAQUE  IOHEXOL  350 MG/ML SOLN COMPARISON:  Dec 16, 2023, March 02, 2023, November 19, 2017 FINDINGS: Lower chest: No acute abnormality. Hepatobiliary: Nodular liver contour. Focal fatty deposition along the falciform ligament. Gallbladder is decompressed and unremarkable Pancreas: Revisualization of multiple coarse calcifications most predominately within the pancreatic head and uncinate process. Similar appearance of hypodensity throughout the pancreatic head and uncinate process. No significant pancreatic ductal dilation is noted downstream. No new adjacent fat stranding. Spleen: Unremarkable Adrenals/Urinary Tract: Adrenal glands are unremarkable. No  hydronephrosis. No obstructing nephrolithiasis. Kidneys enhance symmetrically. Bladder is unremarkable; portions are suboptimally assessed secondary to streak artifact from hip arthroplasty. Stomach/Bowel: No evidence of bowel obstruction. Colon is decompressed. Appendix is normal. Stomach is decompressed with prominence of the folds. Chronic prominence of the duodenal folds. Vascular/Lymphatic: Age advanced atherosclerotic calcifications of the aorta. No new lymphadenopathy. Reproductive: Uterus and bilateral adnexa are unremarkable. Other: Trace free fluid. Musculoskeletal: Similar sclerotic focus of the posterior LEFT iliac bone compared to most recent prior, indolent indolent Leigh increasing in size since 2019 and likely reflecting a bone island or benign fibro-osseous lesion. Status post LEFT hip arthroplasty. IMPRESSION: 1. No evidence of bowel obstruction. Prominence of the gastric and duodenal fold may reflect a degree of underlying chronic gastritis/duodenitis. 2. Similar appearance of the sequela of chronic pancreatitis. 3. Cirrhotic liver morphology 4. Age advanced atherosclerosis. Aortic Atherosclerosis (ICD10-I70.0). Electronically Signed   By: Clancy Crimes M.D.   On: 01/25/2024 13:23    {Document cardiac monitor, telemetry assessment procedure when appropriate:32947} Procedures   Medications Ordered in the ED  dicyclomine  (BENTYL ) injection 20 mg (20 mg Intramuscular Given 01/25/24 1138)  ondansetron  (ZOFRAN ) injection 4 mg (4 mg Intravenous Given 01/25/24 1142)  morphine  (  PF) 4 MG/ML injection 4 mg (4 mg Intravenous Given 01/25/24 1141)  lactated ringers  bolus 1,000 mL (0 mLs Intravenous Stopped 01/25/24 1301)  iohexol  (OMNIPAQUE ) 350 MG/ML injection 75 mL (75 mLs Intravenous Contrast Given 01/25/24 1238)  potassium chloride  SA (KLOR-CON  M) CR tablet 40 mEq (40 mEq Oral Given 01/25/24 1408)      {Click here for ABCD2, HEART and other calculators REFRESH Note before signing:1}                               Medical Decision Making Amount and/or Complexity of Data Reviewed Labs: ordered. Radiology: ordered.  Risk Prescription drug management.   ***  {Document critical care time when appropriate  Document review of labs and clinical decision tools ie CHADS2VASC2, etc  Document your independent review of radiology images and any outside records  Document your discussion with family members, caretakers and with consultants  Document social determinants of health affecting pt's care  Document your decision making why or why not admission, treatments were needed:32947:::1}   Final diagnoses:  None    ED Discharge Orders     None

## 2024-01-25 NOTE — ED Notes (Signed)
 Patient transported to Ultrasound

## 2024-02-02 ENCOUNTER — Emergency Department (HOSPITAL_COMMUNITY)
Admission: EM | Admit: 2024-02-02 | Discharge: 2024-02-02 | Disposition: A | Discharge status: Home / Self Care | Payer: MEDICAID | Attending: Emergency Medicine | Admitting: Emergency Medicine

## 2024-02-02 ENCOUNTER — Emergency Department (HOSPITAL_COMMUNITY): Payer: MEDICAID

## 2024-02-02 DIAGNOSIS — Z7901 Long term (current) use of anticoagulants: Secondary | ICD-10-CM | POA: Diagnosis not present

## 2024-02-02 DIAGNOSIS — K529 Noninfective gastroenteritis and colitis, unspecified: Secondary | ICD-10-CM | POA: Diagnosis not present

## 2024-02-02 DIAGNOSIS — K2990 Gastroduodenitis, unspecified, without bleeding: Secondary | ICD-10-CM | POA: Insufficient documentation

## 2024-02-02 DIAGNOSIS — J449 Chronic obstructive pulmonary disease, unspecified: Secondary | ICD-10-CM | POA: Insufficient documentation

## 2024-02-02 DIAGNOSIS — R1013 Epigastric pain: Secondary | ICD-10-CM | POA: Diagnosis present

## 2024-02-02 LAB — URINALYSIS, ROUTINE W REFLEX MICROSCOPIC
Bilirubin Urine: NEGATIVE
Glucose, UA: NEGATIVE mg/dL
Hgb urine dipstick: NEGATIVE
Ketones, ur: 5 mg/dL — AB
Nitrite: NEGATIVE
Protein, ur: 30 mg/dL — AB
Specific Gravity, Urine: 1.044 — ABNORMAL HIGH (ref 1.005–1.030)
pH: 5 (ref 5.0–8.0)

## 2024-02-02 LAB — COMPREHENSIVE METABOLIC PANEL WITH GFR
ALT: 27 U/L (ref 0–44)
AST: 33 U/L (ref 15–41)
Albumin: 3 g/dL — ABNORMAL LOW (ref 3.5–5.0)
Alkaline Phosphatase: 77 U/L (ref 38–126)
Anion gap: 11 (ref 5–15)
BUN: <5 mg/dL — ABNORMAL LOW (ref 6–20)
CO2: 25 mmol/L (ref 22–32)
Calcium: 8.9 mg/dL (ref 8.9–10.3)
Chloride: 101 mmol/L (ref 98–111)
Creatinine, Ser: 0.65 mg/dL (ref 0.44–1.00)
GFR, Estimated: >60 mL/min (ref >60)
Glucose, Bld: 103 mg/dL — ABNORMAL HIGH (ref 70–99)
Potassium: 3.5 mmol/L (ref 3.5–5.1)
Sodium: 137 mmol/L (ref 135–145)
Total Bilirubin: 0.5 mg/dL (ref 0.0–1.2)
Total Protein: 6.2 g/dL — ABNORMAL LOW (ref 6.5–8.1)

## 2024-02-02 LAB — LIPASE, BLOOD: Lipase: 34 U/L (ref 11–51)

## 2024-02-02 LAB — CBC
HCT: 41 % (ref 36.0–46.0)
Hemoglobin: 14.2 g/dL (ref 12.0–15.0)
MCH: 33.5 pg (ref 26.0–34.0)
MCHC: 34.6 g/dL (ref 30.0–36.0)
MCV: 96.7 fL (ref 80.0–100.0)
Platelets: 243 10*3/uL (ref 150–400)
RBC: 4.24 MIL/uL (ref 3.87–5.11)
RDW: 12.6 % (ref 11.5–15.5)
WBC: 6.7 10*3/uL (ref 4.0–10.5)
nRBC: 0 % (ref 0.0–0.2)

## 2024-02-02 MED ORDER — AMOXICILLIN-POT CLAVULANATE 875-125 MG PO TABS: 1.0000 | ORAL_TABLET | Freq: Two times a day (BID) | ORAL | 0 refills | Status: DC

## 2024-02-02 MED ORDER — ALUM & MAG HYDROXIDE-SIMETH 200-200-20 MG/5ML PO SUSP
30.0000 mL | Freq: Once | ORAL | Status: AC
  Administered 2024-02-02: 30 mL via ORAL
  Filled 2024-02-02: qty 30

## 2024-02-02 MED ORDER — SUCRALFATE 1 G PO TABS
1.0000 g | ORAL_TABLET | Freq: Three times a day (TID) | ORAL | 0 refills | Status: DC
Start: 2024-02-02 — End: 2024-04-28

## 2024-02-02 MED ORDER — ONDANSETRON HCL 4 MG/2ML IJ SOLN
4.0000 mg | Freq: Once | INTRAMUSCULAR | Status: AC
  Administered 2024-02-02: 4 mg via INTRAVENOUS
  Filled 2024-02-02: qty 2

## 2024-02-02 MED ORDER — MORPHINE SULFATE (PF) 4 MG/ML IV SOLN
4.0000 mg | Freq: Once | INTRAVENOUS | Status: AC
  Administered 2024-02-02: 4 mg via INTRAVENOUS
  Filled 2024-02-02: qty 1

## 2024-02-02 MED ORDER — LIDOCAINE VISCOUS HCL 2 % MT SOLN: 15.0000 mL | Freq: Four times a day (QID) | OROMUCOSAL | 0 refills | Status: AC | PRN

## 2024-02-02 MED ORDER — SODIUM CHLORIDE 0.9 % IV BOLUS
1000.0000 mL | Freq: Once | INTRAVENOUS | Status: AC
  Administered 2024-02-02: 1000 mL via INTRAVENOUS

## 2024-02-02 MED ORDER — IOHEXOL 350 MG/ML SOLN
75.0000 mL | Freq: Once | INTRAVENOUS | Status: AC | PRN
  Administered 2024-02-02: 75 mL via INTRAVENOUS

## 2024-02-02 NOTE — ED Notes (Signed)
Alert, NAD, calm, interactive.  

## 2024-02-02 NOTE — ED Triage Notes (Signed)
 Pt. Stated, Felicia Perkins had stomach pain for 2 weeks and I was here and they said a stomach infection. But I might have pancreatitis , since Ive had that before. Im also having some nausea, Im not getting better.

## 2024-02-02 NOTE — ED Provider Notes (Signed)
 [place] Perkins EMERGENCY DEPARTMENT AT [facility] Provider Note   CSN: [id number] Arrival date & time: 02/02/24  9085     Patient presents with: Abdominal Pain and Nausea   [name] is a 48 y.o. female.   The history is provided by the patient and medical records. No language interpreter was used.  Abdominal Pain    48 year old female with significant history of alcohol  abuse, alcohol  liver cirrhosis, recurrent pancreatitis, COPD, bipolar, presenting with complaint of abdominal pain.  Patient states for the past 3 weeks she has had progressive worsening abdominal pain.  Pain is primarily in the epigastric region, waxing waning but has become increasingly more intense.  She endorsed nausea and occasional vomiting and having some loose stools as well.  She noticed that her stool was darker than usual but she does not see any blood.  She admits to alcohol  abuse but states last use was several weeks ago.  She does not endorse any fever or chills no productive cough hemoptysis hematemesis or dysuria.  Prior to Admission medications   Medication Sig Start Date End Date Taking? Authorizing Provider  albuterol  (VENTOLIN  HFA) 108 (90 Base) MCG/ACT inhaler Inhale 1-2 puffs into the lungs every 6 (six) hours as needed for wheezing or shortness of breath. 10/16/23   Newlin, Enobong, MD  clopidogrel  (PLAVIX ) 75 MG tablet Take 1 tablet (75 mg total) by mouth daily. 04/11/23   Jerilynn Lamarr CHRISTELLA, NP  dicyclomine  (BENTYL ) 20 MG tablet Take 1 tablet (20 mg total) by mouth 2 (two) times daily. 01/25/24   Dreama Longs, MD  famotidine  (PEPCID ) 20 MG tablet Take 1 tablet (20 mg total) by mouth 2 (two) times daily. 12/09/23   Newlin, Enobong, MD  lidocaine  (XYLOCAINE ) 2 % solution Use as directed 15 mLs in the mouth or throat every 6 (six) hours as needed (Severe acid reflux symptoms). Mix with 15 mLs of Mylanta and drink. Do not eat or drink for 45 minutes after taking this medication. 12/15/23    White, Elizabeth A, PA-C  mometasone -formoterol  (DULERA ) 200-5 MCG/ACT AERO Inhale 2 puffs into the lungs 2 (two) times daily. 10/16/23   Newlin, Enobong, MD  nicotine  (NICODERM CQ  - DOSED IN MG/24 HOURS) 14 mg/24hr patch Place 1 patch (14 mg total) onto the skin daily as needed (nicotine  cessation). 10/29/23   Newlin, Enobong, MD  ondansetron  (ZOFRAN ) 4 MG tablet TAKE 1 TABLET BY MOUTH EVERY 8 HOURS AS NEEDED FOR NAUSEA AND VOMITING 12/31/23   Newlin, Enobong, MD  ondansetron  (ZOFRAN -ODT) 4 MG disintegrating tablet Take 1 tablet (4 mg total) by mouth every 8 (eight) hours as needed for nausea or vomiting. 01/25/24   Dreama Longs, MD  OVER THE COUNTER MEDICATION Take 1 capsule by mouth daily. [employer], Historical, MD  pantoprazole  (PROTONIX ) 20 MG tablet Take 2 tablets (40 mg total) by mouth daily for 14 days. 01/25/24 02/08/24  Dreama Longs, MD  polyethylene glycol (MIRALAX  / GLYCOLAX ) 17 g packet Take 17 g by mouth daily. Patient taking differently: Take 17 g by mouth daily as needed for moderate constipation. 06/07/22   Patti Rosina SAUNDERS, PA-C  rosuvastatin  (CRESTOR ) 10 MG tablet Take 1 tablet (10 mg total) by mouth daily. 04/11/23   Jerilynn Lamarr CHRISTELLA, NP  senna (SENOKOT) 8.6 MG TABS tablet Take 2 tablets (17.2 mg total) by mouth at bedtime. 04/19/23   Patti Rosina SAUNDERS, PA-C  triamcinolone  cream (KENALOG ) 0.1 % APPLY TO AFFECTED AREA TWICE A DAY  12/31/23   Newlin, Enobong, MD  cloNIDine  (CATAPRES ) 0.1 MG tablet Take 1 tablet (0.1 mg total) by mouth at bedtime. For hot flashes Patient not taking: Reported on 10/17/2019 01/13/19 08/15/20  Newlin, Enobong, MD    Allergies: Patient has no known allergies.    Review of Systems  Gastrointestinal:  Positive for abdominal pain.  All other systems reviewed and are negative.   Updated Vital Signs BP (!) 152/116 (BP Location: Left Arm)   Pulse 80   Temp 97.7 F (36.5 C) (Oral)   Resp 18   SpO2 100%   Physical Exam Vitals and nursing  note reviewed.  Constitutional:      General: She is not in acute distress.    Appearance: She is well-developed.  HENT:     Head: Atraumatic.   Eyes:     Conjunctiva/sclera: Conjunctivae normal.    Cardiovascular:     Rate and Rhythm: Normal rate and regular rhythm.  Pulmonary:     Effort: Pulmonary effort is normal.  Abdominal:     Tenderness: There is abdominal tenderness in the epigastric area.   Musculoskeletal:     Cervical back: Neck supple.   Skin:    Findings: No rash.   Neurological:     Mental Status: She is alert.   Psychiatric:        Mood and Affect: Mood normal.     (all labs ordered are listed, but only abnormal results are displayed) Labs Reviewed  COMPREHENSIVE METABOLIC PANEL WITH GFR - Abnormal; Notable for the following components:      Result Value   Glucose, Bld 103 (*)    BUN <5 (*)    Total Protein 6.2 (*)    Albumin 3.0 (*)    All other components within normal limits  URINALYSIS, ROUTINE W REFLEX MICROSCOPIC - Abnormal; Notable for the following components:   Color, Urine AMBER (*)    APPearance HAZY (*)    Specific Gravity, Urine 1.044 (*)    Ketones, ur 5 (*)    Protein, ur 30 (*)    Leukocytes,Ua SMALL (*)    Bacteria, UA RARE (*)    All other components within normal limits  LIPASE, BLOOD  CBC    EKG: None  Radiology: CT ABDOMEN PELVIS W CONTRAST Result Date: 02/02/2024 CLINICAL DATA:  Stomach pain for 2 weeks. EXAM: CT ABDOMEN AND PELVIS WITH CONTRAST TECHNIQUE: Multidetector CT imaging of the abdomen and pelvis was performed using the standard protocol following bolus administration of intravenous contrast. RADIATION DOSE REDUCTION: This exam was performed according to the departmental dose-optimization program which includes automated exposure control, adjustment of the mA and/or kV according to patient size and/or use of iterative reconstruction technique. CONTRAST:  75mL OMNIPAQUE  IOHEXOL  350 MG/ML SOLN COMPARISON:  January 25, 2024 FINDINGS: Lower chest: No acute abnormality. Hepatobiliary: No focal liver abnormality is seen. No gallstones, gallbladder wall thickening, or biliary dilatation. Pancreas: A large cluster of subcentimeter parenchymal calcifications are seen within the pancreatic head. No peripancreatic inflammatory fat stranding or ductal dilatation is seen. Spleen: Normal in size without focal abnormality. Adrenals/Urinary Tract: Adrenal glands are unremarkable. Kidneys are normal in size, without renal calculi or hydronephrosis. A 12 mm diameter simple cyst is seen within the posterior aspect of the mid left kidney. Bladder is unremarkable. Stomach/Bowel: The stomach is poorly distended. Moderate severity low-attenuation gastric wall thickening is seen involving the gastric antrum and duodenal bulb. Appendix appears normal. No evidence of bowel dilatation. The distal  descending colon and sigmoid colon are moderately thickened and inflamed. Vascular/Lymphatic: Aortic atherosclerosis. No enlarged abdominal or pelvic lymph nodes. Reproductive: Status post hysterectomy. No adnexal masses. Other: No abdominal wall hernia or abnormality. No abdominopelvic ascites. Musculoskeletal: No acute or significant osseous findings. IMPRESSION: 1. Moderate severity low-attenuation gastric wall thickening involving the gastric antrum and duodenal bulb, which may represent sequelae associated with gastritis/duodenitis. Correlation with follow-up upper endoscopy is recommended. 2. Moderate severity distal descending colon and sigmoid colitis. 3. Findings consistent with sequelae associated with chronic pancreatitis. 4. Aortic atherosclerosis. Electronically Signed   By: Suzen Dials M.D.   On: 02/02/2024 14:08     Procedures   Medications Ordered in the ED  alum & mag hydroxide-simeth (MAALOX/MYLANTA) 200-200-20 MG/5ML suspension 30 mL (has no administration in time range)  ondansetron  (ZOFRAN ) injection 4 mg (4 mg Intravenous  Given 02/02/24 1230)  morphine  (PF) 4 MG/ML injection 4 mg (4 mg Intravenous Given 02/02/24 1231)  sodium chloride  0.9 % bolus 1,000 mL (0 mLs Intravenous Stopped 02/02/24 1301)  iohexol  (OMNIPAQUE ) 350 MG/ML injection 75 mL (75 mLs Intravenous Contrast Given 02/02/24 1333)                                    Medical Decision Making Amount and/or Complexity of Data Reviewed Labs: ordered. Radiology: ordered.  Risk OTC drugs. Prescription drug management.   BP (!) 152/116 (BP Location: Left Arm)   Pulse 80   Temp 97.7 F (36.5 C) (Oral)   Resp 18   SpO2 100%   21:71 AM  47 year old female with significant history of alcohol  abuse, alcohol  liver cirrhosis, recurrent pancreatitis, COPD, bipolar, presenting with complaint of abdominal pain.  Patient states for the past 3 weeks she has had progressive worsening abdominal pain.  Pain is primarily in the epigastric region, waxing waning but has become increasingly more intense.  She endorsed nausea and occasional vomiting and having some loose stools as well.  She noticed that her stool was darker than usual but she does not see any blood.  She admits to alcohol  abuse but states last use was several weeks ago.  She does not endorse any fever or chills no productive cough hemoptysis hematemesis or dysuria.  Exam notable for diffuse abdominal tenderness more significant to the epigastric region.  Bowel sounds present.  Lungs are clear heart with normal rate and rhythm and patient is nontoxic  -Labs ordered, independently viewed and interpreted by me.  Labs remarkable for BC, normal lipase, normal electrolyte panel, urinalysis without signs of UTI -The patient was maintained on a cardiac monitor.  I personally viewed and interpreted the cardiac monitored which showed an underlying rhythm of: Sinus rhythm. -Imaging independently viewed and interpreted by me and I agree with radiologist's interpretation.  Result remarkable for abdominal pelvis CT  scan.  Which demonstrates evidence of gastritis/duodenitis.  Patient also has moderate to severe descending colon and sigmoid colitis.  She also has found consistent with sequela of associated chronic pancreatitis. -This patient presents to the ED for concern of abdominal pain, this involves an extensive number of treatment options, and is a complaint that carries with it a high risk of complications and morbidity.  The differential diagnosis includes GERD, gastritis, PUD, pancreatitis, cholecystitis, appendicitis, colitis, diverticulitis, UTI, kidney stone, pneumonia, ACS -Co morbidities that complicate the patient evaluation includes alcohol  abuse, liver cirrhosis, recurrent pancreatitis, COPD, bipolar -Treatment includes IV fluid, Zofran ,  morphine , GI cocktail -Reevaluation of the patient after these medicines showed that the patient improved -PCP office notes or outside notes reviewed -Escalation to admission/observation considered: patients feels much better, is comfortable with discharge, and will follow up with PCP -Prescription medication considered, patient comfortable with Augmentin  for colitis, Zofran  for nausea, Percocet for pain -Social Determinant of Health considered which includes likely food insecurity, tobacco use, stress, social isolation      Final diagnoses:  Gastritis and duodenitis  Colitis    ED Discharge Orders          Ordered    sucralfate  (CARAFATE ) 1 g tablet  3 times daily with meals & bedtime        02/02/24 1453    amoxicillin -clavulanate (AUGMENTIN ) 875-125 MG tablet  Every 12 hours        02/02/24 1453    lidocaine  (XYLOCAINE ) 2 % solution  Every 6 hours PRN        02/02/24 1453               Nivia Colon, PA-C 02/02/24 1458    Cottie Donnice PARAS, MD 02/02/24 [phone]

## 2024-02-02 NOTE — Discharge Instructions (Signed)
 You have been evaluated for your symptoms.  CT scan today demonstrate evidence of colitis which is inflammation or infection of your colon.  You also have signs of gastritis which is inflammation of your stomach.  Please continue to take Pepcid  and Carafate  as prescribed to help soothe your stomach.  Take antibiotic as treatment of your colitis.  Call or follow-up closely with your gastroenterologist and request for an endoscopy for further assessment.  Return to ER if your condition worsen, if you are having trouble keeping food down or if you have other concern.  Avoid alcohol  at all cost.

## 2024-02-05 ENCOUNTER — Encounter: Payer: Self-pay | Admitting: Cardiology

## 2024-02-05 MED ORDER — CLOPIDOGREL BISULFATE 75 MG PO TABS: 75.0000 mg | ORAL_TABLET | Freq: Every day | ORAL | 1 refills | Status: DC

## 2024-02-07 ENCOUNTER — Telehealth: Payer: Self-pay

## 2024-02-07 NOTE — Telephone Encounter (Signed)
 Patient called in to she has a rash, just started taking amoxicillin  this week. Encouraged her to be seen whether it is at urgent care, ED or PCP. No c/o of breathing difficulty

## 2024-03-04 ENCOUNTER — Ambulatory Visit (HOSPITAL_COMMUNITY)
Admission: RE | Admit: 2024-03-04 | Discharge: 2024-03-04 | Disposition: A | Discharge status: Home / Self Care | Payer: MEDICAID | Source: Ambulatory Visit | Attending: Vascular Surgery | Admitting: Vascular Surgery

## 2024-03-04 ENCOUNTER — Ambulatory Visit (INDEPENDENT_AMBULATORY_CARE_PROVIDER_SITE_OTHER): Payer: MEDICAID | Admitting: Physician Assistant

## 2024-03-04 VITALS — BP 147/91 | Temp 97.8°F | Wt 102.8 lb

## 2024-03-04 DIAGNOSIS — Z9889 Other specified postprocedural states: Secondary | ICD-10-CM | POA: Diagnosis not present

## 2024-03-04 DIAGNOSIS — I6523 Occlusion and stenosis of bilateral carotid arteries: Secondary | ICD-10-CM | POA: Diagnosis present

## 2024-03-04 NOTE — Progress Notes (Signed)
 Office Note     CC:  follow up Requesting Provider:  Delbert Clam, MD  HPI: Felicia Perkins is a 48 y.o. (12-27-1975) female who presents for surveillance follow up of carotid artery stenosis. She has history of right CEA on 01/26/21 by Dr. Sheree. This was for asymptomatic high grade stenosis. No history of TIA or stroke.   Today she denies any visual changes, slurred speech, facial drooping, unilateral upper or lower extremity weakness or numbness. She denies any pain in  her legs on ambulation or rest. No tissue loss. She is medically managed on Statin and Plavix . She is still smoking- she says at most 4 cigarettes a day  She reports recent episode of gastritis/ colitis. Has follow up with GI on 03/26/24  Past Medical History:  Diagnosis Date   Acute on chronic pancreatitis (HCC) 03/22/2019   Acute pancreatitis    Alcohol  abuse    Alcohol  dependence (HCC) 12/16/2012   Medical detox successful    Alcohol  induced acute pancreatitis 11/22/2017   Alcoholic cirrhosis of liver without ascites (HCC) 10/15/2016   Anemia    Anxiety    Aortic atherosclerosis (HCC)    Bipolar 1 disorder (HCC)    Cancer (HCC)    cervical cancer   Chest pain with moderate risk for cardiac etiology 12/15/2014   Cirrhosis (HCC)    COPD (chronic obstructive pulmonary disease) (HCC) 09/04/2016   Coronary artery disease    Depression    Duodenitis 11/22/2017   Eczema 10/09/2018   Elevated liver enzymes 12/22/2014   Fracture of femoral neck, left (HCC) 06/04/2022   Gastritis and gastroduodenitis 09/04/2016   GERD (gastroesophageal reflux disease)    Hip fracture (HCC) 06/03/2022   Nondisplaced fracture of neck of fifth metacarpal bone, right hand, initial encounter for closed fracture 08/26/2017   Pancreatitis    Peripheral vascular disease (HCC)    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   PTSD (post-traumatic stress disorder)    Tobacco abuse 09/04/2016   Tobacco abuse counseling    Vascular disease      Past Surgical History:  Procedure Laterality Date   ABDOMINAL HYSTERECTOMY  2004   BIOPSY  01/02/2018   Procedure: BIOPSY;  Surgeon: Teressa Toribio SQUIBB, MD;  Location: [facility];  Service: Endoscopy;;   COLONOSCOPY     Done in [place] she thinks. Early 20's. Normal   ENDARTERECTOMY Right 01/26/2021   Procedure: RIGHT CAROTID ARTERY ENDARTERECTOMY;  Surgeon: Sheree Penne Bruckner, MD;  Location: [facility] OR;  Service: Vascular;  Laterality: Right;   ESOPHAGOGASTRODUODENOSCOPY (EGD) WITH PROPOFOL  N/A 01/02/2018   Procedure: ESOPHAGOGASTRODUODENOSCOPY (EGD) WITH PROPOFOL ;  Surgeon: Teressa Toribio SQUIBB, MD;  Location: [facility];  Service: Endoscopy;  Laterality: N/A;   EUS N/A 01/02/2018   Procedure: UPPER ENDOSCOPIC ULTRASOUND (EUS) RADIAL;  Surgeon: Teressa Toribio SQUIBB, MD;  Location: [facility];  Service: Endoscopy;  Laterality: N/A;   FOOT SURGERY     HIP PINNING,CANNULATED Left 06/05/2022   Procedure: PERCUTANEOUS FIXATION OF FEMORAL NECK;  Surgeon: Ernie Cough, MD;  Location: [facility] OR;  Service: Orthopedics;  Laterality: Left;   OTHER SURGICAL HISTORY     ovarian surgery   TOTAL HIP ARTHROPLASTY Left 04/18/2023   Procedure: TOTAL HIP ARTHROPLASTY ANTERIOR APPROACH;  Surgeon: Ernie Cough, MD;  Location: [facility];  Service: Orthopedics;  Laterality: Left;   TUBAL LIGATION      Social History   Socioeconomic History   Marital status: Single    Spouse name: Not on file   Number of children:  3   Years of education: Not on file   Highest education level: 12th grade  Occupational History   Occupation: homemaker  Tobacco Use   Smoking status: Every Day    Current packs/day: 0.25    Types: Cigarettes   Smokeless tobacco: Never   Tobacco comments:    5-6     cigarettes daily  Vaping Use   Vaping status: Never Used  Substance and Sexual Activity   Alcohol  use: Yes    Comment: 2 glasses   Drug use: No   Sexual activity: Yes    Birth control/protection: Condom  Other Topics  Concern   Not on file  Social History Narrative   Not on file   Social Drivers of Health   Financial Resource Strain: Medium Risk (10/16/2023)   Overall Financial Resource Strain (CARDIA)    Difficulty of Paying Living Expenses: Somewhat hard  Food Insecurity: Food Insecurity Present (10/16/2023)   Hunger Vital Sign    Worried About Running Out of Food in the Last Year: Sometimes true    Ran Out of Food in the Last Year: Sometimes true  Transportation Needs: No Transportation Needs (10/16/2023)   PRAPARE - Administrator, Civil Service (Medical): No    Lack of Transportation (Non-Medical): No  Physical Activity: Insufficiently Active (10/16/2023)   Exercise Vital Sign    Days of Exercise per Week: 4 days    Minutes of Exercise per Session: 30 min  Stress: Stress Concern Present (10/16/2023)   [employer] of Occupational Health - Occupational Stress Questionnaire    Feeling of Stress : To some extent  Social Connections: Socially Isolated (10/16/2023)   Social Connection and Isolation Panel    Frequency of Communication with Friends and Family: Once a week    Frequency of Social Gatherings with Friends and Family: Once a week    Attends Religious Services: More than 4 times per year    Active Member of [employer] or Organizations: No    Attends Engineer, structural: Not on file    Marital Status: Never married  Intimate Partner Violence: Not At Risk (04/18/2023)   Humiliation, Afraid, Rape, and Kick questionnaire    Fear of Current or Ex-Partner: No    Emotionally Abused: No    Physically Abused: No    Sexually Abused: No    Family History  Problem Relation Age of Onset   Hypertension Mother    Breast cancer Mother 73   Pancreatitis Mother    Hypertension Father    Heart attack Cousin 30       died in 24s of MI   Breast cancer Maternal Aunt    Breast cancer Maternal Aunt    Colon cancer Neg Hx    Esophageal cancer Neg Hx    Rectal cancer Neg Hx    Stomach  cancer Neg Hx     Current Outpatient Medications  Medication Sig Dispense Refill   albuterol  (VENTOLIN  HFA) 108 (90 Base) MCG/ACT inhaler Inhale 1-2 puffs into the lungs every 6 (six) hours as needed for wheezing or shortness of breath. 8.5 g 6   amoxicillin -clavulanate (AUGMENTIN ) 875-125 MG tablet Take 1 tablet by mouth every 12 (twelve) hours. 20 tablet 0   clopidogrel  (PLAVIX ) 75 MG tablet Take 1 tablet (75 mg total) by mouth daily. 30 tablet 1   dicyclomine  (BENTYL ) 20 MG tablet Take 1 tablet (20 mg total) by mouth 2 (two) times daily. 20 tablet 0   famotidine  (PEPCID )  20 MG tablet Take 1 tablet (20 mg total) by mouth 2 (two) times daily. 180 tablet 1   lidocaine  (XYLOCAINE ) 2 % solution Use as directed 15 mLs in the mouth or throat every 6 (six) hours as needed (Severe acid reflux symptoms). Mix with 15 mLs of Mylanta and drink. Do not eat or drink for 45 minutes after taking this medication. 100 mL 0   mometasone -formoterol  (DULERA ) 200-5 MCG/ACT AERO Inhale 2 puffs into the lungs 2 (two) times daily. 13 g 6   nicotine  (NICODERM CQ  - DOSED IN MG/24 HOURS) 14 mg/24hr patch Place 1 patch (14 mg total) onto the skin daily as needed (nicotine  cessation). 28 patch 1   ondansetron  (ZOFRAN ) 4 MG tablet TAKE 1 TABLET BY MOUTH EVERY 8 HOURS AS NEEDED FOR NAUSEA AND VOMITING 20 tablet 1   ondansetron  (ZOFRAN -ODT) 4 MG disintegrating tablet Take 1 tablet (4 mg total) by mouth every 8 (eight) hours as needed for nausea or vomiting. 20 tablet 0   OVER THE COUNTER MEDICATION Take 1 capsule by mouth daily. [place]     pantoprazole  (PROTONIX ) 20 MG tablet Take 2 tablets (40 mg total) by mouth daily for 14 days. 28 tablet 0   polyethylene glycol (MIRALAX  / GLYCOLAX ) 17 g packet Take 17 g by mouth daily. (Patient taking differently: Take 17 g by mouth daily as needed for moderate constipation.) 14 each 0   rosuvastatin  (CRESTOR ) 10 MG tablet Take 1 tablet (10 mg total) by mouth daily. 30 tablet 11   senna  (SENOKOT) 8.6 MG TABS tablet Take 2 tablets (17.2 mg total) by mouth at bedtime. 120 tablet 0   sucralfate  (CARAFATE ) 1 g tablet Take 1 tablet (1 g total) by mouth 4 (four) times daily -  with meals and at bedtime. 30 tablet 0   triamcinolone  cream (KENALOG ) 0.1 % APPLY TO AFFECTED AREA TWICE A DAY 30 g 1   No current facility-administered medications for this visit.    No Known Allergies   REVIEW OF SYSTEMS:   [X]  denotes positive finding, [ ]  denotes negative finding Cardiac  Comments:  Chest pain or chest pressure:    Shortness of breath upon exertion:    Short of breath when lying flat:    Irregular heart rhythm:        Vascular    Pain in calf, thigh, or hip brought on by ambulation:    Pain in feet at night that wakes you up from your sleep:     Blood clot in your veins:    Leg swelling:         Pulmonary    Oxygen at home:    Productive cough:     Wheezing:         Neurologic    Sudden weakness in arms or legs:     Sudden numbness in arms or legs:     Sudden onset of difficulty speaking or slurred speech:    Temporary loss of vision in one eye:     Problems with dizziness:         Gastrointestinal    Blood in stool:     Vomited blood:         Genitourinary    Burning when urinating:     Blood in urine:        Psychiatric    Major depression:         Hematologic    Bleeding problems:    Problems with blood  clotting too easily:        Skin    Rashes or ulcers:        Constitutional    Fever or chills:      PHYSICAL EXAMINATION:  Vitals:   03/04/24 0823 03/04/24 0825  BP: (!) 150/90 (!) 147/91  Temp: 97.8 F (36.6 C)   TempSrc: Temporal   Weight: 102 lb 12.8 oz (46.6 kg)     General:  WDWN in NAD; vital signs documented above Gait: Normal HENT: WNL, normocephalic Pulmonary: normal non-labored breathing Cardiac: regular HR; no carotid bruits Abdomen: soft Vascular Exam/Pulses: 2+ radial, 2+ femoral, 2+ DP Extremities: without ischemic  changes, without Gangrene , without cellulitis; without open wounds;  Musculoskeletal: no muscle wasting or atrophy  Neurologic: A&O X 3 Psychiatric:  The pt has Normal affect.   Non-Invasive Vascular Imaging:   VAS US  Carotid duplex: Summary:  Right Carotid: Velocities in the right ICA are consistent with a 1-39% stenosis.   Left Carotid: Velocities in the left ICA are consistent with a 1-39% stenosis.   Vertebrals: Right vertebral artery demonstrates antegrade flow. Left vertebral artery was not visualized.    ASSESSMENT/PLAN:: 48 y.o. female here for follow up for carotid artery stenosis. She has history of right CEA on 01/26/21 by Dr. Sheree. This was for asymptomatic high grade stenosis. She is without any associated TIA or stroke like symptoms.  - Duplex shows 1-39% ICA stenosis bilaterally - Continue Statin and Plavix  - she will follow up in 1 year with repeat carotid duplex   Teretha Damme, PA-C [facility] [phone]  On call MD:   Magda

## 2024-03-25 NOTE — Progress Notes (Signed)
 Ellouise Console, PA-C [address] [place], [place]  [address] Phone: [phone]   Primary Care Physician: Delbert Clam, MD  Primary Gastroenterologist:  Ellouise Console, PA-C / Dr. Gordy Starch   Chief Complaint:  F/U Epigastric Pain, Gastritis, Colitis, Chronic Pancreatitis       HPI:   Felicia Perkins is a 48 y.o. female presents for ED follow-up of colitis.  She went to [facility] ED 02/02/2024 to evaluate 3-week history of worsening epigastric abdominal pain.  Had associated nausea, vomiting and dark stool.  Abdominal pelvic CT showed gastritis, duodenitis, descending and sigmoid colon colitis, chronic pancreatitis.  She was discharged on antibiotics (Augmentin ) for 2 weeks.  Patient works as a Production designer, theatre/television/film at [employer].  She thinks she may have caught a bacterial infection from working with food which may have caused diarrhea.  She continues to have loose and watery stools every 3 or 4 hours with persistent diarrhea.  Also continues to have persistent epigastric pain.  She denies melena or hematochezia.    Her last drink of alcohol  was 2 days ago.  She drinks mikes hard lemonade.  Her daughter was murdered 2 years ago and she has difficulty coping.  She has also been off her bipolar medications for many years.  Tearful today.  PMH: alcohol  abuse, alcohol  liver cirrhosis, recurrent pancreatitis, COPD, bipolar, CAD, HTN, vascular disease, cirrhosis, GERD, tobacco abuse, carotid artery stenosis.  Currently on Plavix .  02/02/2024 CT abdomen pelvis with contrast: 1. Moderate severity low-attenuation gastric wall thickening involving the gastric antrum and duodenal bulb, which may represent sequelae associated with gastritis/duodenitis. Correlation with follow-up upper endoscopy is recommended. 2. Moderate severity distal descending colon and sigmoid colitis. 3. Findings consistent with sequelae associated with chronic pancreatitis. 4. Aortic atherosclerosis. 5. No focal liver  abnormality is seen. No gallstones, gallbladder wall thickening, or biliary dilatation.  01/25/2024 RUQ ultrasound: Cirrhosis. No focal liver lesions identified. Please note that liver protocol enhanced MR and CT are the most sensitive tests for the screening detection of hepatocellular carcinoma in the high risk setting of cirrhosis.  01/2022 EGD:  Mild Gastritis, otherwise normal.  No varices. No portal gastropathy.  Biopsies negative for H. pylori and metaplasia.  01/2022 Colonoscopy:  3mm tubular adenoma polyp removed.  Diverticulosis.  Small internal hemorrhoids.  7-year repeat.  Component Ref Range & Units (hover) 1 mo ago (02/02/24) 2 mo ago (01/25/24) 3 mo ago (12/16/23) 11 mo ago (04/19/23) 11 mo ago (04/08/23) 1 yr ago (03/11/23) 1 yr ago (03/10/23)  WBC 6.7 9.1 7.7 16.0 High  9.0 7.8 8.7  RBC 4.24 4.57 3.76 Low  3.31 Low  4.10 3.97 4.41  Hemoglobin 14.2 14.9 12.5 11.3 Low  13.9 13.2 15.0  HCT 41.0 44.5 36.7 33.9 Low  40.9 40.1 43.5  MCV 96.7 97.4 97.6 102.4 High  99.8 101.0 High  98.6  Platelets 243 306 244 189 270 257     Component Ref Range & Units (hover) 1 mo ago (02/02/24) 2 mo ago (01/25/24) 3 mo ago (12/16/23) 1 yr ago (03/13/23) 1 yr ago (03/12/23) 1 yr ago (03/10/23) 1 yr ago (11/01/22)  Lipase 34 33 CM 30 CM 35 CM 39 CM 144 High  CM 43 CM   Component Ref Range & Units (hover) 1 mo ago (02/02/24) 2 mo ago (01/25/24) 3 mo ago (12/16/23) 5 mo ago (10/18/23) 11 mo ago (04/19/23) 11 mo ago (04/08/23) 1 yr ago (03/13/23)  Sodium 137 137 133 Low  141 R 136 133 Low  136  Potassium 3.5 3.2 Low  3.5 3.6 R 3.8 3.8 4.1  Chloride 101 98 98 98 R 103 98 101  CO2 25 26 27 27  R 23 25 26   Glucose, Bld 103 High  100 High  CM 100 High  CM 92 119 High  CM 104 High  CM 97 CM  BUN <5 Low  <5 Low  5 Low  6 R 6 8 <5 Low   Creatinine, Ser 0.65 0.61 0.54 0.57 R 0.52 0.60 0.62  Calcium  8.9 9.4 9.0 9.8 R 8.8 Low  9.3 9.0  Total Protein 6.2 Low  6.7 7.0 7.0 R   6.1 Low   Albumin 3.0 Low  3.2 Low   3.6 4.4 R   3.3 Low   AST 33 28 23 42 High  R   23  ALT 27 10 15  33 High  R   7  Alkaline Phosphatase 77 76 75 112 R   68  Total Bilirubin 0.5 0.6 0.9 0.6   1.0 R  GFR, Estimated >60 >60 CM >60 CM  >60 CM >60 CM >60 C    Current Outpatient Medications  Medication Sig Dispense Refill   albuterol  (VENTOLIN  HFA) 108 (90 Base) MCG/ACT inhaler Inhale 1-2 puffs into the lungs every 6 (six) hours as needed for wheezing or shortness of breath. 8.5 g 6   clopidogrel  (PLAVIX ) 75 MG tablet Take 1 tablet (75 mg total) by mouth daily. 30 tablet 1   famotidine  (PEPCID ) 20 MG tablet Take 1 tablet (20 mg total) by mouth 2 (two) times daily. 180 tablet 1   lidocaine  (XYLOCAINE ) 2 % solution Use as directed 15 mLs in the mouth or throat every 6 (six) hours as needed (Severe acid reflux symptoms). Mix with 15 mLs of Mylanta and drink. Do not eat or drink for 45 minutes after taking this medication. 100 mL 0   mometasone -formoterol  (DULERA ) 200-5 MCG/ACT AERO Inhale 2 puffs into the lungs 2 (two) times daily. 13 g 6   nicotine  (NICODERM CQ  - DOSED IN MG/24 HOURS) 14 mg/24hr patch Place 1 patch (14 mg total) onto the skin daily as needed (nicotine  cessation). 28 patch 1   ondansetron  (ZOFRAN ) 4 MG tablet TAKE 1 TABLET BY MOUTH EVERY 8 HOURS AS NEEDED FOR NAUSEA AND VOMITING 20 tablet 1   ondansetron  (ZOFRAN -ODT) 4 MG disintegrating tablet Take 1 tablet (4 mg total) by mouth every 8 (eight) hours as needed for nausea or vomiting. 20 tablet 0   OVER THE COUNTER MEDICATION Take 1 capsule by mouth daily. [place]     pantoprazole  (PROTONIX ) 40 MG tablet Take 1 tablet (40 mg total) by mouth daily. 90 tablet 3   polyethylene glycol (MIRALAX  / GLYCOLAX ) 17 g packet Take 17 g by mouth daily. (Patient taking differently: Take 17 g by mouth daily as needed for moderate constipation.) 14 each 0   rosuvastatin  (CRESTOR ) 10 MG tablet Take 1 tablet (10 mg total) by mouth daily. 30 tablet 11   triamcinolone  cream (KENALOG ) 0.1 %  APPLY TO AFFECTED AREA TWICE A DAY 30 g 1   sucralfate  (CARAFATE ) 1 g tablet Take 1 tablet (1 g total) by mouth 4 (four) times daily -  with meals and at bedtime. 30 tablet 0   No current facility-administered medications for this visit.    Allergies as of 03/26/2024   (No Known Allergies)    Past Medical History:  Diagnosis Date  Acute on chronic pancreatitis (HCC) 03/22/2019   Acute pancreatitis    Alcohol  abuse    Alcohol  dependence (HCC) 12/16/2012   Medical detox successful    Alcohol  induced acute pancreatitis 11/22/2017   Alcoholic cirrhosis of liver without ascites (HCC) 10/15/2016   Anemia    Anxiety    Aortic atherosclerosis (HCC)    Bipolar 1 disorder (HCC)    Cancer (HCC)    cervical cancer   Chest pain with moderate risk for cardiac etiology 12/15/2014   Cirrhosis (HCC)    COPD (chronic obstructive pulmonary disease) (HCC) 09/04/2016   Coronary artery disease    Depression    Duodenitis 11/22/2017   Eczema 10/09/2018   Elevated liver enzymes 12/22/2014   Fracture of femoral neck, left (HCC) 06/04/2022   Gastritis and gastroduodenitis 09/04/2016   GERD (gastroesophageal reflux disease)    Hip fracture (HCC) 06/03/2022   Nondisplaced fracture of neck of fifth metacarpal bone, right hand, initial encounter for closed fracture 08/26/2017   Pancreatitis    Peripheral vascular disease (HCC)    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   PTSD (post-traumatic stress disorder)    Tobacco abuse 09/04/2016   Tobacco abuse counseling    Vascular disease     Past Surgical History:  Procedure Laterality Date   ABDOMINAL HYSTERECTOMY  2004   BIOPSY  01/02/2018   Procedure: BIOPSY;  Surgeon: Teressa Toribio SQUIBB, MD;  Location: [facility];  Service: Endoscopy;;   COLONOSCOPY     Done in [place] she thinks. Early 20's. Normal   ENDARTERECTOMY Right 01/26/2021   Procedure: RIGHT CAROTID ARTERY ENDARTERECTOMY;  Surgeon: Sheree Penne Bruckner, MD;  Location: [facility] OR;  Service:  Vascular;  Laterality: Right;   ESOPHAGOGASTRODUODENOSCOPY (EGD) WITH PROPOFOL  N/A 01/02/2018   Procedure: ESOPHAGOGASTRODUODENOSCOPY (EGD) WITH PROPOFOL ;  Surgeon: Teressa Toribio SQUIBB, MD;  Location: [facility];  Service: Endoscopy;  Laterality: N/A;   EUS N/A 01/02/2018   Procedure: UPPER ENDOSCOPIC ULTRASOUND (EUS) RADIAL;  Surgeon: Teressa Toribio SQUIBB, MD;  Location: [facility];  Service: Endoscopy;  Laterality: N/A;   FOOT SURGERY     HIP PINNING,CANNULATED Left 06/05/2022   Procedure: PERCUTANEOUS FIXATION OF FEMORAL NECK;  Surgeon: Ernie Cough, MD;  Location: [facility] OR;  Service: Orthopedics;  Laterality: Left;   OTHER SURGICAL HISTORY     ovarian surgery   TOTAL HIP ARTHROPLASTY Left 04/18/2023   Procedure: TOTAL HIP ARTHROPLASTY ANTERIOR APPROACH;  Surgeon: Ernie Cough, MD;  Location: [facility];  Service: Orthopedics;  Laterality: Left;   TUBAL LIGATION      Review of Systems:    All systems reviewed and negative except where noted in HPI.    Physical Exam:  BP 122/74   Pulse 76   Ht 5' 4 (1.626 m)   Wt 99 lb (44.9 kg)   BMI 16.99 kg/m  No LMP recorded. Patient has had a hysterectomy.  General: Well-nourished, well-developed in no acute distress.  Lungs: Clear to auscultation bilaterally. Non-labored. Heart: Regular rate and rhythm, no murmurs rubs or gallops.  Abdomen: Bowel sounds are normal; Abdomen is Soft; No hepatosplenomegaly, masses or hernias;  No Abdominal Tenderness; No guarding or rebound tenderness. Neuro: Alert and oriented x 3.  Grossly intact.  Psych: Alert and cooperative, normal mood and affect.   Imaging Studies: VAS US  CAROTID Result Date: 03/04/2024 Carotid Arterial Duplex Study Patient Name:  Felicia Perkins  Date of Exam:   03/04/2024 Medical Rec #: [id number]          Accession #:  [id number] Date of Birth: [birth year]           Patient Gender: F Patient Age:   1 years Exam Location:  [address] Procedure:      VAS US  CAROTID Referring Phys:  PENNE COLORADO --------------------------------------------------------------------------------  Indications:       Carotid artery disease. Risk Factors:      Prior CVA. Other Factors:     01/26/21 Rt carotid endarterectomy. Comparison Study:  01/09/23 Performing Technologist: Duwaine Hives RVS  Examination Guidelines: A complete evaluation includes B-mode imaging, spectral Doppler, color Doppler, and power Doppler as needed of all accessible portions of each vessel. Bilateral testing is considered an integral part of a complete examination. Limited examinations for reoccurring indications may be performed as noted.  Right Carotid Findings: +----------+--------+--------+--------+------------------+--------+           PSV cm/sEDV cm/sStenosisPlaque DescriptionComments +----------+--------+--------+--------+------------------+--------+ CCA Prox  62      21              heterogenous               +----------+--------+--------+--------+------------------+--------+ CCA Distal62      23              heterogenous               +----------+--------+--------+--------+------------------+--------+ ICA Prox  87      35      1-39%   heterogenous               +----------+--------+--------+--------+------------------+--------+ ICA Mid   81      28                                         +----------+--------+--------+--------+------------------+--------+ ICA Distal56      22                                         +----------+--------+--------+--------+------------------+--------+ ECA       47      12                                         +----------+--------+--------+--------+------------------+--------+ +----------+--------+-------+--------+-------------------+           PSV cm/sEDV cmsDescribeArm Pressure (mmHG) +----------+--------+-------+--------+-------------------+ [id number]      4                                   +----------+--------+-------+--------+-------------------+ +---------+--------+--+--------+--+---------+ VertebralPSV cm/s31EDV cm/s16Antegrade +---------+--------+--+--------+--+---------+  Left Carotid Findings: +----------+--------+--------+--------+------------------+--------+           PSV cm/sEDV cm/sStenosisPlaque DescriptionComments +----------+--------+--------+--------+------------------+--------+ CCA Prox  72      22              heterogenous               +----------+--------+--------+--------+------------------+--------+ CCA Distal71      19              heterogenous               +----------+--------+--------+--------+------------------+--------+ ICA Prox  83      31      1-39%   heterogenous               +----------+--------+--------+--------+------------------+--------+  ICA Mid   78      29                                         +----------+--------+--------+--------+------------------+--------+ ICA Distal88      35                                         +----------+--------+--------+--------+------------------+--------+ ECA       56      16                                         +----------+--------+--------+--------+------------------+--------+ +----------+--------+--------+--------+-------------------+           PSV cm/sEDV cm/sDescribeArm Pressure (mmHG) +----------+--------+--------+--------+-------------------+ Dlarojcpjw35      7                                   +----------+--------+--------+--------+-------------------+ +---------+--------+--------+--------------+ VertebralPSV cm/sEDV cm/sNot identified +---------+--------+--------+--------------+   Summary: Right Carotid: Velocities in the right ICA are consistent with a 1-39% stenosis. Left Carotid: Velocities in the left ICA are consistent with a 1-39% stenosis. Vertebrals:  Right vertebral artery demonstrates antegrade flow. Left vertebral              artery  was not visualized. Subclavians: Normal flow hemodynamics were seen in bilateral subclavian              arteries. *See table(s) above for measurements and observations.  Electronically signed by Debby Robertson on 03/04/2024 at 12:38:19 PM.    Final     Labs: CBC    Component Value Date/Time   WBC 6.7 02/02/2024 0941   RBC 4.24 02/02/2024 0941   HGB 14.2 02/02/2024 0941   HGB 12.6 11/06/2021 1046   HCT 41.0 02/02/2024 0941   HCT 38.7 11/06/2021 1046   PLT 243 02/02/2024 0941   PLT 244 11/06/2021 1046   MCV 96.7 02/02/2024 0941   MCV 101 (H) 11/06/2021 1046   MCH 33.5 02/02/2024 0941   MCHC 34.6 02/02/2024 0941   RDW 12.6 02/02/2024 0941   RDW 13.1 11/06/2021 1046   LYMPHSABS 5.2 (H) 01/25/2024 0818   LYMPHSABS 3.2 (H) 11/06/2021 1046   MONOABS 0.6 01/25/2024 0818   EOSABS 0.2 01/25/2024 0818   EOSABS 0.1 11/06/2021 1046   BASOSABS 0.1 01/25/2024 0818   BASOSABS 0.1 11/06/2021 1046    CMP     Component Value Date/Time   NA 137 02/02/2024 0941   NA 141 10/18/2023 1036   K 3.5 02/02/2024 0941   CL 101 02/02/2024 0941   CO2 25 02/02/2024 0941   GLUCOSE 103 (H) 02/02/2024 0941   BUN <5 (L) 02/02/2024 0941   BUN 6 10/18/2023 1036   CREATININE 0.65 02/02/2024 0941   CREATININE 0.50 10/15/2016 1021   CALCIUM  8.9 02/02/2024 0941   PROT 6.2 (L) 02/02/2024 0941   PROT 7.0 10/18/2023 1036   ALBUMIN 3.0 (L) 02/02/2024 0941   ALBUMIN 4.4 10/18/2023 1036   AST 33 02/02/2024 0941   ALT 27 02/02/2024 0941   ALKPHOS 77 02/02/2024 0941   BILITOT 0.5 02/02/2024 0941   BILITOT 0.6 10/18/2023 1036  GFRNONAA >60 02/02/2024 0941   GFRNONAA >89 10/15/2016 1021   GFRAA >60 05/13/2020 0531   GFRAA >89 10/15/2016 1021       Assessment and Plan:   COBIE LEIDNER is a 48 y.o. y/o female presents for hospital follow-up of:  1.  Chronic alcoholic pancreatitis - Lab: Lipase - Lengthy discussion regarding abstinence from all alcohol . - We discussed resources such as behavioral  health, counseling, Alcoholics Anonymous.  2.  Cirrhosis - Labs: CBC, CMP, PT/INR  3.  Diarrhea - Stool studies: GI pathogen panel, fecal pancreatic elastase, fecal calprotectin - Consider pancreas enzymes if fecal pancreatic elastase is low. - Schedule repeat colonoscopy if fecal calprotectin is elevated.  4.  Epigastric pain /abnormal CT imaging of the stomach - Scheduling EGD I discussed risks of EGD with patient to include risk of bleeding, perforation, and risk of sedation.  Patient expressed understanding and agrees to proceed with EGD.   5.  Gastritis - Schedule repeat EGD  -Continue pantoprazole  40 Mg daily, #90, 3 RF - Avoid alcohol  and NSAIDs  Ellouise Console, PA-C  Follow up office visit in 3 months.  Also follow-up based on test results and symptoms.

## 2024-03-26 ENCOUNTER — Encounter: Payer: Self-pay | Admitting: Physician Assistant

## 2024-03-26 ENCOUNTER — Ambulatory Visit (INDEPENDENT_AMBULATORY_CARE_PROVIDER_SITE_OTHER): Payer: MEDICAID | Admitting: Physician Assistant

## 2024-03-26 VITALS — BP 122/74 | HR 76 | Ht 64.0 in | Wt 99.0 lb

## 2024-03-26 DIAGNOSIS — R1013 Epigastric pain: Secondary | ICD-10-CM

## 2024-03-26 DIAGNOSIS — K7030 Alcoholic cirrhosis of liver without ascites: Secondary | ICD-10-CM | POA: Diagnosis not present

## 2024-03-26 DIAGNOSIS — K860 Alcohol-induced chronic pancreatitis: Secondary | ICD-10-CM | POA: Diagnosis not present

## 2024-03-26 DIAGNOSIS — R197 Diarrhea, unspecified: Secondary | ICD-10-CM | POA: Diagnosis not present

## 2024-03-26 DIAGNOSIS — K2970 Gastritis, unspecified, without bleeding: Secondary | ICD-10-CM

## 2024-03-26 DIAGNOSIS — F1010 Alcohol abuse, uncomplicated: Secondary | ICD-10-CM

## 2024-03-26 DIAGNOSIS — K5904 Chronic idiopathic constipation: Secondary | ICD-10-CM

## 2024-03-26 DIAGNOSIS — R933 Abnormal findings on diagnostic imaging of other parts of digestive tract: Secondary | ICD-10-CM

## 2024-03-26 MED ORDER — PANTOPRAZOLE SODIUM 40 MG PO TBEC: 40.0000 mg | DELAYED_RELEASE_TABLET | Freq: Every day | ORAL | 3 refills | Status: AC

## 2024-03-26 NOTE — Patient Instructions (Signed)
 Your provider has requested that you go to the basement level for lab work before leaving today. Press B on the elevator. The lab is located at the first door on the left as you exit the elevator.  You have been scheduled for an Endoscopy. Please follow written instructions given to you at your visit today.  You will be contacted by our office prior to your procedure for directions on holding your Plavix .  If you do not hear from our office 1 week prior to your scheduled procedure, please call [phone] to discuss.   If you use inhalers (even only as needed), please bring them with you on the day of your procedure.  If you take any of the following medications, they will need to be adjusted prior to your procedure:   DO NOT TAKE 7 DAYS PRIOR TO TEST- Trulicity (dulaglutide) Ozempic, Wegovy (semaglutide) Mounjaro (tirzepatide) Bydureon Bcise (exanatide extended release)  DO NOT TAKE 1 DAY PRIOR TO YOUR TEST Rybelsus (semaglutide) Adlyxin (lixisenatide) Victoza (liraglutide) Byetta (exanatide) ___________________________________________________________________________  Please follow up sooner if symptoms increase or worsen  Due to recent changes in healthcare laws, you may see the results of your imaging and laboratory studies on MyChart before your provider has had a chance to review them.  We understand that in some cases there may be results that are confusing or concerning to you. Not all laboratory results come back in the same time frame and the provider may be waiting for multiple results in order to interpret others.  Please give us  48 hours in order for your provider to thoroughly review all the results before contacting the office for clarification of your results.   Thank you for trusting me with your gastrointestinal care!   Ellouise Console, PA-C _______________________________________________________  If your blood pressure at your visit was 140/90 or greater, please  contact your primary care physician to follow up on this.  _______________________________________________________  If you are age 49 or older, your body mass index should be between 23-30. Your Body mass index is 16.99 kg/m. If this is out of the aforementioned range listed, please consider follow up with your Primary Care Provider.  If you are age 10 or younger, your body mass index should be between 19-25. Your Body mass index is 16.99 kg/m. If this is out of the aformentioned range listed, please consider follow up with your Primary Care Provider.   ________________________________________________________  The [facility] providers would like to encourage you to use MYCHART to communicate with providers for non-urgent requests or questions.  Due to long hold times on the telephone, sending your provider a message by [facility] may be a faster and more efficient way to get a response.  Please allow 48 business hours for a response.  Please remember that this is for non-urgent requests.  _______________________________________________________

## 2024-03-30 ENCOUNTER — Telehealth: Payer: Self-pay

## 2024-03-30 NOTE — Telephone Encounter (Signed)
 [facility] Pre-operative Risk Assessment     Request for surgical clearance:     Endoscopy Procedure  What type of surgery is being performed?     Endoscopy  When is this surgery scheduled?     05/19/24  What type of clearance is required ?   Pharmacy  Are there any medications that need to be held prior to surgery and how long? Plavix  5 days  Practice name and name of physician performing surgery?      [facility]  What is your office phone and fax number?      Phone- [phone]  Fax- [phone]  Anesthesia type (None, local, MAC, general) ?       MAC   Please route your response to Alethea Blocker, CMA

## 2024-03-30 NOTE — Telephone Encounter (Signed)
  Felicia Perkins 04-Jan-1976 [id number]  03/30/2024   Dear Charlyne, Corrina, PA-C :  We have scheduled the above named patient for a(n) Endoscopy procedure. Our records show that (s)he is on anticoagulation therapy.  Please advise as to whether the patient may come off their therapy of Plavix  5 days prior to their procedure which is scheduled for 05/19/24.  Please route your response to Alethea Blocker, CMA or fax response to [phone].  Sincerely,    [facility]

## 2024-03-30 NOTE — Telephone Encounter (Signed)
   Patient Name: Felicia Perkins  DOB: 07-31-1976 MRN: [id number]  Primary Cardiologist: Kardie Tobb, DO  Chart reviewed as part of pre-operative protocol coverage.   She has history of right CEA on 01/26/21 by Dr. Sheree. Therefore, her Plavix  is managed by vascular surgery. Thanks    Lum LITTIE Louis, NP 03/30/2024, 12:52 PM

## 2024-03-31 NOTE — Telephone Encounter (Signed)
 errror

## 2024-04-06 ENCOUNTER — Other Ambulatory Visit (INDEPENDENT_AMBULATORY_CARE_PROVIDER_SITE_OTHER): Payer: MEDICAID

## 2024-04-06 DIAGNOSIS — K7030 Alcoholic cirrhosis of liver without ascites: Secondary | ICD-10-CM

## 2024-04-06 DIAGNOSIS — K860 Alcohol-induced chronic pancreatitis: Secondary | ICD-10-CM | POA: Diagnosis not present

## 2024-04-06 LAB — CBC WITH DIFFERENTIAL/PLATELET
Basophils Absolute: 0 K/uL (ref 0.0–0.1)
Basophils Relative: 0.5 % (ref 0.0–3.0)
Eosinophils Absolute: 0.3 K/uL (ref 0.0–0.7)
Eosinophils Relative: 5.3 % — ABNORMAL HIGH (ref 0.0–5.0)
HCT: 41.7 % (ref 36.0–46.0)
Hemoglobin: 14.2 g/dL (ref 12.0–15.0)
Lymphocytes Relative: 56.9 % — ABNORMAL HIGH (ref 12.0–46.0)
Lymphs Abs: 3.6 K/uL (ref 0.7–4.0)
MCHC: 34 g/dL (ref 30.0–36.0)
MCV: 97.3 fl (ref 78.0–100.0)
Monocytes Absolute: 0.4 K/uL (ref 0.1–1.0)
Monocytes Relative: 7.1 % (ref 3.0–12.0)
Neutro Abs: 1.9 K/uL (ref 1.4–7.7)
Neutrophils Relative %: 30.2 % — ABNORMAL LOW (ref 43.0–77.0)
Platelets: 250 K/uL (ref 150.0–400.0)
RBC: 4.28 Mil/uL (ref 3.87–5.11)
RDW: 12.7 % (ref 11.5–15.5)
WBC: 6.3 K/uL (ref 4.0–10.5)

## 2024-04-06 LAB — COMPREHENSIVE METABOLIC PANEL WITH GFR
ALT: 42 U/L — ABNORMAL HIGH (ref 0–35)
AST: 67 U/L — ABNORMAL HIGH (ref 0–37)
Albumin: 4.4 g/dL (ref 3.5–5.2)
Alkaline Phosphatase: 85 U/L (ref 39–117)
BUN: 3 mg/dL — ABNORMAL LOW (ref 6–23)
CO2: 28 meq/L (ref 19–32)
Calcium: 9.2 mg/dL (ref 8.4–10.5)
Chloride: 101 meq/L (ref 96–112)
Creatinine, Ser: 0.59 mg/dL (ref 0.40–1.20)
GFR: 105.84 mL/min (ref >60.00)
Glucose, Bld: 85 mg/dL (ref 70–99)
Potassium: 3.6 meq/L (ref 3.5–5.1)
Sodium: 141 meq/L (ref 135–145)
Total Bilirubin: 0.5 mg/dL (ref 0.2–1.2)
Total Protein: 7.4 g/dL (ref 6.0–8.3)

## 2024-04-06 LAB — PROTIME-INR
INR: 1.1 ratio — ABNORMAL HIGH (ref 0.8–1.0)
Prothrombin Time: 11.8 s (ref 9.6–13.1)

## 2024-04-06 LAB — LIPASE: Lipase: 113 U/L — ABNORMAL HIGH (ref 11.0–59.0)

## 2024-04-08 ENCOUNTER — Other Ambulatory Visit: Payer: MEDICAID

## 2024-04-08 ENCOUNTER — Telehealth: Payer: Self-pay | Admitting: Internal Medicine

## 2024-04-08 ENCOUNTER — Ambulatory Visit: Payer: Self-pay | Admitting: Physician Assistant

## 2024-04-08 DIAGNOSIS — K860 Alcohol-induced chronic pancreatitis: Secondary | ICD-10-CM

## 2024-04-08 DIAGNOSIS — R1013 Epigastric pain: Secondary | ICD-10-CM

## 2024-04-08 DIAGNOSIS — R197 Diarrhea, unspecified: Secondary | ICD-10-CM

## 2024-04-08 NOTE — Telephone Encounter (Signed)
 Patient calling in regards to bicyclomine medication, states it has not been helpful. Patient is requesting f/u call with nurse to discuss possible solutions, call back [phone]. Please review and advise  Thank you

## 2024-04-08 NOTE — Telephone Encounter (Signed)
 Attempted to reach patient x 2. Busy signal x 2.  Patient needs to return her stool studies for further evaluation of her symptoms.

## 2024-04-09 NOTE — Telephone Encounter (Signed)
 Again attempted to reach patient x 2. Busy signal x 2.

## 2024-04-10 ENCOUNTER — Telehealth: Payer: Self-pay | Admitting: *Deleted

## 2024-04-10 LAB — GI PROFILE, STOOL, PCR

## 2024-04-10 LAB — CALPROTECTIN, FECAL: Calprotectin, Fecal: 493 ug/g — ABNORMAL HIGH (ref 0–120)

## 2024-04-10 MED ORDER — VANCOMYCIN HCL 125 MG PO CAPS: 125.0000 mg | ORAL_CAPSULE | Freq: Four times a day (QID) | ORAL | 0 refills | Status: AC

## 2024-04-10 NOTE — Telephone Encounter (Signed)
 Received a call from [facility] that patient's stool testing has returned positive for C Difficile infection.  Please advise.[email or web address]

## 2024-04-10 NOTE — Telephone Encounter (Signed)
 Attempted to reach patient again. Get no answer and number then goes to a busy signal. I have attempted to reach patients emergency contact, Kamora. No answer, rings busy. Contacted patients 2nd emergency contact, Calvin. He states that we have the right number on file for patient and she is probably just asleep. States he is on his way home and will have her call our office back.  Will go ahead and send vancomycin  rx.

## 2024-04-10 NOTE — Telephone Encounter (Signed)
 Felicia Perkins, the official lab result indicating positive C. Diff infection has not yet been entered in Epic. Per your message, [facility] reported positive C. Diff.   Pls contact the patient and let her know she tested positive for C. Diff. Pls send RX for Vancomycin  125mg  one capsule po qid x 14 days. Patient to provide update early next week.  Pls instruct patient to wash hands thoroughly with soap and warm water  to reduce risk of spreading C. Diff to household members. Push fluids and bland diet.

## 2024-04-10 NOTE — Telephone Encounter (Signed)
 Attempted to reach patient. Phone line rings busy. Patient was also sent a message via mychart with results/recommendations. Unfortunately, she has not read this either though she was active on her mychart account as of 04/08/24. Will attempt to reach patient again at time she is due for labs.

## 2024-04-11 ENCOUNTER — Other Ambulatory Visit: Payer: Self-pay | Admitting: Family Medicine

## 2024-04-11 ENCOUNTER — Other Ambulatory Visit: Payer: Self-pay | Admitting: Adult Health

## 2024-04-11 DIAGNOSIS — Z634 Disappearance and death of family member: Secondary | ICD-10-CM

## 2024-04-13 NOTE — Telephone Encounter (Signed)
 Unable to refill per protocol, Rx expired. Discontinued 01/31/23  Requested Prescriptions  Pending Prescriptions Disp Refills   hydrOXYzine  (ATARAX ) 25 MG tablet [Pharmacy Med Name: HYDROXYZINE  HCL 25 MG TABLET] 60 tablet 3    Sig: TAKE 1 TABLET BY MOUTH THREE TIMES A DAY AS NEEDED     Ear, Nose, and Throat:  Antihistamines 2 Passed - 04/13/2024 11:05 AM      Passed - Cr in normal range and within 360 days    Creat  Date Value Ref Range Status  10/15/2016 0.50 0.50 - 1.10 mg/dL Final   Creatinine, Ser  Date Value Ref Range Status  04/06/2024 0.59 0.40 - 1.20 mg/dL Final         Passed - Valid encounter within last 12 months    Recent Outpatient Visits           6 months ago Palpitations   [facility] [place]. [facility] Delbert Clam, MD   6 months ago Flu-like symptoms   [facility] Celestia Rosaline SQUIBB, NP   12 months ago Avascular necrosis of bone of left hip [facility])   [facility] [place]. [facility] Delbert Clam, MD   1 year ago Alcoholic cirrhosis of liver without ascites [facility])   [facility] Of [place]. [facility] Delbert Clam, MD   1 year ago Other emphysema [facility])   [facility] Of [place]. [facility] Delbert Clam, MD       Future Appointments             In 1 week Delbert Clam, MD [facility] - [facility] [place]. [facility] [place]

## 2024-04-14 MED ORDER — DICYCLOMINE HCL 20 MG PO TABS: 20.0000 mg | ORAL_TABLET | Freq: Three times a day (TID) | ORAL | 1 refills | Status: DC | PRN

## 2024-04-14 NOTE — Telephone Encounter (Signed)
 I have spoken to patient to advise of positive C Difficile infection (she had antibiotics x 2 weeks recently) and the need to take vancomyin 4 times daily x 14 days. Advised Rx has been sent to pharmacy for this and she should get this as soon as possible. Also discussed wiping down surfaces/toilet etc with bleach to kill c diff spores and prevent transmission. Discussed through hand washing with soap and water , discussed other members of household using a separate bathroom if possible. She verbalizes understanding.  Additionally, I have advised the patient that lipase, AST and ALT have come back elevated. CBC, electrolytes normal. Reiterated that she avoid all alcohol  to prevent worsening labs/pancreatitis. She also verbalizes understanding of this.  Patient states that she was given dicyclomine  (20 mg) by another provider recently and this did help with her abdominal cramping. She is requesting a refill of this.   Ellouise, would you be okay providing dicyclomine  refill?

## 2024-04-14 NOTE — Progress Notes (Signed)
 Positive C. difficile.  GI pathogen panel negative for all other infections.  Patient was started on vancomycin  125 mg 4 times daily x 14 days.  See other message in chart.  Fecal calprotectin is elevated which could be due to C. difficile infection. Please schedule patient follow-up office visit with me in 3 weeks.  She is scheduled for EGD with Dr. Albertus 05/19/2024.  Remaining to make sure diarrhea has resolved before that procedure.  We will have to postpone procedure if she still having diarrhea. Ellouise Console, PA-C

## 2024-04-14 NOTE — Telephone Encounter (Signed)
 Reminder placed in EPIC to contact patient in 3 weeks for symptom update.

## 2024-04-14 NOTE — Telephone Encounter (Signed)
 Dicyclomine  sent to pharmacy. No follow up appointments available with T.Garrett, PA-C through 06/15/24 at this time.  Shall I just have her call us  back with an update in 3 weeks and if still having symptoms, can postpone procedure at that time and regroup?

## 2024-04-16 LAB — PANCREATIC ELASTASE, FECAL: Pancreatic Elastase-1, Stool: >800 ug/g (ref >200)

## 2024-04-17 ENCOUNTER — Other Ambulatory Visit: Payer: Self-pay | Admitting: Cardiology

## 2024-04-17 ENCOUNTER — Other Ambulatory Visit: Payer: Self-pay | Admitting: Family Medicine

## 2024-04-17 DIAGNOSIS — Z1231 Encounter for screening mammogram for malignant neoplasm of breast: Secondary | ICD-10-CM

## 2024-04-21 ENCOUNTER — Encounter: Payer: Self-pay | Admitting: Family Medicine

## 2024-04-21 DIAGNOSIS — Z1231 Encounter for screening mammogram for malignant neoplasm of breast: Secondary | ICD-10-CM

## 2024-04-22 ENCOUNTER — Ambulatory Visit: Payer: Self-pay | Admitting: Family Medicine

## 2024-04-22 ENCOUNTER — Encounter: Payer: Self-pay | Admitting: Cardiology

## 2024-04-23 NOTE — Telephone Encounter (Signed)
 I would rather she see her PCP as soon as possible.  K.Tobb,MD  04/23/24

## 2024-04-24 ENCOUNTER — Other Ambulatory Visit: Payer: Self-pay | Admitting: Family Medicine

## 2024-04-24 MED ORDER — FLUCONAZOLE 150 MG PO TABS: 150.0000 mg | ORAL_TABLET | Freq: Once | ORAL | 0 refills | Status: AC

## 2024-04-27 DIAGNOSIS — Z1231 Encounter for screening mammogram for malignant neoplasm of breast: Secondary | ICD-10-CM

## 2024-04-28 ENCOUNTER — Ambulatory Visit: Payer: MEDICAID | Attending: Family Medicine | Admitting: Family Medicine

## 2024-04-28 ENCOUNTER — Encounter: Payer: Self-pay | Admitting: Family Medicine

## 2024-04-28 ENCOUNTER — Ambulatory Visit: Payer: MEDICAID | Admitting: Podiatry

## 2024-04-28 VITALS — BP 133/89 | HR 72 | Ht 64.0 in | Wt 111.4 lb

## 2024-04-28 DIAGNOSIS — K219 Gastro-esophageal reflux disease without esophagitis: Secondary | ICD-10-CM

## 2024-04-28 DIAGNOSIS — K7030 Alcoholic cirrhosis of liver without ascites: Secondary | ICD-10-CM | POA: Diagnosis not present

## 2024-04-28 DIAGNOSIS — Z23 Encounter for immunization: Secondary | ICD-10-CM

## 2024-04-28 DIAGNOSIS — J438 Other emphysema: Secondary | ICD-10-CM | POA: Diagnosis not present

## 2024-04-28 DIAGNOSIS — Z131 Encounter for screening for diabetes mellitus: Secondary | ICD-10-CM

## 2024-04-28 DIAGNOSIS — G5603 Carpal tunnel syndrome, bilateral upper limbs: Secondary | ICD-10-CM

## 2024-04-28 DIAGNOSIS — F17210 Nicotine dependence, cigarettes, uncomplicated: Secondary | ICD-10-CM

## 2024-04-28 DIAGNOSIS — L84 Corns and callosities: Secondary | ICD-10-CM | POA: Diagnosis not present

## 2024-04-28 DIAGNOSIS — Z720 Tobacco use: Secondary | ICD-10-CM

## 2024-04-28 DIAGNOSIS — R202 Paresthesia of skin: Secondary | ICD-10-CM | POA: Diagnosis not present

## 2024-04-28 DIAGNOSIS — Z9889 Other specified postprocedural states: Secondary | ICD-10-CM

## 2024-04-28 LAB — POCT GLYCOSYLATED HEMOGLOBIN (HGB A1C): Hemoglobin A1C: 5.1 % (ref 4.0–5.6)

## 2024-04-28 MED ORDER — ONDANSETRON 4 MG PO TBDP: 4.0000 mg | ORAL_TABLET | Freq: Three times a day (TID) | ORAL | 0 refills | Status: DC | PRN

## 2024-04-28 MED ORDER — NICOTINE 14 MG/24HR TD PT24: 14.0000 mg | MEDICATED_PATCH | Freq: Every day | TRANSDERMAL | 1 refills | Status: AC | PRN

## 2024-04-28 MED ORDER — GABAPENTIN 300 MG PO CAPS: 300.0000 mg | ORAL_CAPSULE | Freq: Every day | ORAL | 1 refills | Status: AC

## 2024-04-28 NOTE — Progress Notes (Signed)
 Subjective:  Patient ID: Felicia Perkins, female    DOB: 1976/06/19  Age: 48 y.o. MRN: [id number]  CC: Medical Management of Chronic Issues (Left foot pain/Discuss weight)     Discussed the use of AI scribe software for clinical note transcription with the patient, who gave verbal consent to proceed.  History of Present Illness Felicia Perkins is a 48 year old female with a history of  alcoholic liver cirrhosis, chronic alcoholic pancreatitis, COPD,  R ICA stenosis (s/p R CEA in 01/2021) stenosis, tobacco abuse, s/p right carotid endarterectomy in 01/2021 due to right ICA stenosis, h/o closed fracture of the left hip status post percutaneous fixation of left femoral neck who presents with numbness and tingling in her fingers.  She experiences intermittent numbness and tingling in her fingers over the past few months, with one episode in her feet. The numbness interferes with her work activities.  Often at night she has to wake up and shake her hand for relief.  Symptoms are worse on the right.  She has a past experience with B12 deficiency.  She is currently under GI care for management of alcoholic liver cirrhosis and is scheduled for an upper endoscopy.  She has a history of colitis and was treated with antibiotics.  Her bowel movements have normalized, and she no longer has stomach pain.  She remains on her PPI and antiemetic but does sometimes experience epigastric burning. She is excited about weight gain over the past couple of years, which she attributes to taking her daughter's iron pills. Her hemoglobin levels were normal in August.  She experiences ongoing foot pain in the sole of her left foot, with significant pain upon pressure. She is concerned about the possibility of further surgery.  She did have similar symptoms in her right and underwent surgery.  She smokes about six cigarettes a day and has previously used nicotine  patches effectively. She has quit alcohol  use.  She has  COPD and uses an inhaler.  She has carotid artery stenosis and is on a statin and Plavix .  Currently under cardiology care.      Past Medical History:  Diagnosis Date   Acute on chronic pancreatitis (HCC) 03/22/2019   Acute pancreatitis    Alcohol  abuse    Alcohol  dependence (HCC) 12/16/2012   Medical detox successful    Alcohol  induced acute pancreatitis 11/22/2017   Alcoholic cirrhosis of liver without ascites (HCC) 10/15/2016   Anemia    Anxiety    Aortic atherosclerosis (HCC)    Bipolar 1 disorder (HCC)    Cancer (HCC)    cervical cancer   Chest pain with moderate risk for cardiac etiology 12/15/2014   Cirrhosis (HCC)    COPD (chronic obstructive pulmonary disease) (HCC) 09/04/2016   Coronary artery disease    Depression    Duodenitis 11/22/2017   Eczema 10/09/2018   Elevated liver enzymes 12/22/2014   Fracture of femoral neck, left (HCC) 06/04/2022   Gastritis and gastroduodenitis 09/04/2016   GERD (gastroesophageal reflux disease)    Hip fracture (HCC) 06/03/2022   Nondisplaced fracture of neck of fifth metacarpal bone, right hand, initial encounter for closed fracture 08/26/2017   Pancreatitis    Peripheral vascular disease (HCC)    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   PTSD (post-traumatic stress disorder)    Tobacco abuse 09/04/2016   Tobacco abuse counseling    Vascular disease     Past Surgical History:  Procedure Laterality Date   ABDOMINAL HYSTERECTOMY  2004  BIOPSY  01/02/2018   Procedure: BIOPSY;  Surgeon: Teressa Toribio SQUIBB, MD;  Location: [facility];  Service: Endoscopy;;   COLONOSCOPY     Done in [place] she thinks. Early 20's. Normal   ENDARTERECTOMY Right 01/26/2021   Procedure: RIGHT CAROTID ARTERY ENDARTERECTOMY;  Surgeon: Sheree Penne Bruckner, MD;  Location: [facility] OR;  Service: Vascular;  Laterality: Right;   ESOPHAGOGASTRODUODENOSCOPY (EGD) WITH PROPOFOL  N/A 01/02/2018   Procedure: ESOPHAGOGASTRODUODENOSCOPY (EGD) WITH PROPOFOL ;  Surgeon:  Teressa Toribio SQUIBB, MD;  Location: [facility];  Service: Endoscopy;  Laterality: N/A;   EUS N/A 01/02/2018   Procedure: UPPER ENDOSCOPIC ULTRASOUND (EUS) RADIAL;  Surgeon: Teressa Toribio SQUIBB, MD;  Location: [facility];  Service: Endoscopy;  Laterality: N/A;   FOOT SURGERY     HIP PINNING,CANNULATED Left 06/05/2022   Procedure: PERCUTANEOUS FIXATION OF FEMORAL NECK;  Surgeon: Ernie Cough, MD;  [facility];  Service: Orthopedics;  Laterality: Left;   OTHER SURGICAL HISTORY     ovarian surgery   TOTAL HIP ARTHROPLASTY Left 04/18/2023   Procedure: TOTAL HIP ARTHROPLASTY ANTERIOR APPROACH;  Surgeon: Ernie Cough, MD;  Location: [facility];  Service: Orthopedics;  Laterality: Left;   TUBAL LIGATION      Family History  Problem Relation Age of Onset   Hypertension Mother    Breast cancer Mother 5   Pancreatitis Mother    Hypertension Father    Heart attack Cousin 30       died in 22s of MI   Breast cancer Maternal Aunt    Breast cancer Maternal Aunt    Colon cancer Neg Hx    Esophageal cancer Neg Hx    Rectal cancer Neg Hx    Stomach cancer Neg Hx     Social History   Socioeconomic History   Marital status: Single    Spouse name: Not on file   Number of children: 3   Years of education: Not on file   Highest education level: 12th grade  Occupational History   Occupation: homemaker  Tobacco Use   Smoking status: Every Day    Current packs/day: 0.25    Types: Cigarettes   Smokeless tobacco: Never   Tobacco comments:    5-6     cigarettes daily  Vaping Use   Vaping status: Never Used  Substance and Sexual Activity   Alcohol  use: Yes    Comment: 2 glasses   Drug use: No   Sexual activity: Yes    Birth control/protection: Condom  Other Topics Concern   Not on file  Social History Narrative   Not on file   Social Drivers of Health   Financial Resource Strain: High Risk (04/26/2024)   Overall Financial Resource Strain (CARDIA)    Difficulty of Paying Living  Expenses: Hard  Food Insecurity: Food Insecurity Present (04/26/2024)   Hunger Vital Sign    Worried About Running Out of Food in the Last Year: Sometimes true    Ran Out of Food in the Last Year: Sometimes true  Transportation Needs: No Transportation Needs (04/26/2024)   PRAPARE - Administrator, Civil Service (Medical): No    Lack of Transportation (Non-Medical): No  Physical Activity: Inactive (04/26/2024)   Exercise Vital Sign    Days of Exercise per Week: 2 days    Minutes of Exercise per Session: 0 min  Stress: No Stress Concern Present (04/26/2024)   [employer] of Occupational Health - Occupational Stress Questionnaire    Feeling of Stress: Only a  little  Social Connections: Moderately Integrated (04/26/2024)   Social Connection and Isolation Panel    Frequency of Communication with Friends and Family: Once a week    Frequency of Social Gatherings with Friends and Family: Never    Attends Religious Services: 1 to 4 times per year    Active Member of [employer] or Organizations: Yes    Attends Engineer, structural: More than 4 times per year    Marital Status: Living with partner    No Known Allergies  Outpatient Medications Prior to Visit  Medication Sig Dispense Refill   albuterol  (VENTOLIN  HFA) 108 (90 Base) MCG/ACT inhaler Inhale 1-2 puffs into the lungs every 6 (six) hours as needed for wheezing or shortness of breath. 8.5 g 6   clopidogrel  (PLAVIX ) 75 MG tablet Take 1 tablet (75 mg total) by mouth daily. 30 tablet 1   dicyclomine  (BENTYL ) 20 MG tablet Take 1 tablet (20 mg total) by mouth 3 (three) times daily as needed for spasms. 90 tablet 1   famotidine  (PEPCID ) 20 MG tablet Take 1 tablet (20 mg total) by mouth 2 (two) times daily. 180 tablet 1   lidocaine  (XYLOCAINE ) 2 % solution Use as directed 15 mLs in the mouth or throat every 6 (six) hours as needed (Severe acid reflux symptoms). Mix with 15 mLs of Mylanta and drink. Do not eat or drink for 45  minutes after taking this medication. 100 mL 0   mometasone -formoterol  (DULERA ) 200-5 MCG/ACT AERO Inhale 2 puffs into the lungs 2 (two) times daily. 13 g 6   OVER THE COUNTER MEDICATION Take 1 capsule by mouth daily. [place]     pantoprazole  (PROTONIX ) 40 MG tablet Take 1 tablet (40 mg total) by mouth daily. 90 tablet 3   polyethylene glycol (MIRALAX  / GLYCOLAX ) 17 g packet Take 17 g by mouth daily. (Patient taking differently: Take 17 g by mouth daily as needed for moderate constipation.) 14 each 0   rosuvastatin  (CRESTOR ) 10 MG tablet TAKE 1 TABLET BY MOUTH EVERY DAY 15 tablet 0   triamcinolone  cream (KENALOG ) 0.1 % APPLY TO AFFECTED AREA TWICE A DAY 30 g 1   nicotine  (NICODERM CQ  - DOSED IN MG/24 HOURS) 14 mg/24hr patch Place 1 patch (14 mg total) onto the skin daily as needed (nicotine  cessation). 28 patch 1   ondansetron  (ZOFRAN ) 4 MG tablet TAKE 1 TABLET BY MOUTH EVERY 8 HOURS AS NEEDED FOR NAUSEA AND VOMITING 20 tablet 1   ondansetron  (ZOFRAN -ODT) 4 MG disintegrating tablet Take 1 tablet (4 mg total) by mouth every 8 (eight) hours as needed for nausea or vomiting. 20 tablet 0   sucralfate  (CARAFATE ) 1 g tablet Take 1 tablet (1 g total) by mouth 4 (four) times daily -  with meals and at bedtime. 30 tablet 0   No facility-administered medications prior to visit.     ROS Review of Systems  Constitutional:  Negative for activity change, appetite change and fatigue.  HENT:  Negative for congestion, sinus pressure and sore throat.   Eyes:  Negative for visual disturbance.  Respiratory:  Negative for cough, chest tightness, shortness of breath and wheezing.   Cardiovascular:  Negative for chest pain and palpitations.  Gastrointestinal:  Positive for abdominal pain. Negative for abdominal distention and constipation.  Endocrine: Negative for polydipsia.  Genitourinary:  Negative for dysuria and frequency.  Musculoskeletal:        See HPI  Skin:  Negative for rash.  Neurological:   Positive  for numbness. Negative for tremors and light-headedness.  Hematological:  Does not bruise/bleed easily.  Psychiatric/Behavioral:  Negative for agitation and behavioral problems.     Objective:  BP 133/89   Pulse 72   Ht 5' 4 (1.626 m)   Wt 111 lb 6.4 oz (50.5 kg)   SpO2 100%   BMI 19.12 kg/m      04/28/2024   11:13 AM 03/26/2024    3:01 PM 03/04/2024    8:25 AM  BP/Weight  Systolic BP 133 122 147  Diastolic BP 89 74 91  Wt. (Lbs) 111.4 99   BMI 19.12 kg/m2 16.99 kg/m2       Physical Exam Constitutional:      Appearance: She is well-developed.  Cardiovascular:     Rate and Rhythm: Normal rate.     Heart sounds: Normal heart sounds. No murmur heard. Pulmonary:     Effort: Pulmonary effort is normal.     Breath sounds: Normal breath sounds. No wheezing or rales.  Chest:     Chest wall: No tenderness.  Abdominal:     General: Bowel sounds are normal. There is no distension.     Palpations: Abdomen is soft. There is no mass.     Tenderness: There is no abdominal tenderness.  Musculoskeletal:     Right lower leg: No edema.     Left lower leg: No edema.     Comments: Positive Phalen sign on the right wrist, negative on the left Negative Tinel signs bilaterally  Skin:    Comments: Painful corn on lateral aspect of sole of left foot at the base of the left fifth metatarsal head  Neurological:     Mental Status: She is alert and oriented to person, place, and time.  Psychiatric:        Mood and Affect: Mood normal.        Latest Ref Rng & Units 04/06/2024    9:44 AM 02/02/2024    9:41 AM 01/25/2024    8:18 AM  CMP  Glucose 70 - 99 mg/dL 85  896  899   BUN 6 - 23 mg/dL 3  <5  <5   Creatinine 0.40 - 1.20 mg/dL 9.40  9.34  9.38   Sodium 135 - 145 mEq/L 141  137  137   Potassium 3.5 - 5.1 mEq/L 3.6  3.5  3.2   Chloride 96 - 112 mEq/L 101  101  98   CO2 19 - 32 mEq/L 28  25  26    Calcium  8.4 - 10.5 mg/dL 9.2  8.9  9.4   Total Protein 6.0 - 8.3 g/dL 7.4  6.2   6.7   Total Bilirubin 0.2 - 1.2 mg/dL 0.5  0.5  0.6   Alkaline Phos 39 - 117 U/L 85  77  76   AST 0 - 37 U/L 67  33  28   ALT 0 - 35 U/L 42  27  10     Lipid Panel     Component Value Date/Time   CHOL 199 10/18/2023 1036   CHOL 215 (H) 01/24/2022 1054   TRIG 107 10/18/2023 1036   TRIG 232 (H) 01/24/2022 1054   HDL 84 10/18/2023 1036   CHOLHDL 3.7 03/22/2019 1428   VLDL 18 03/22/2019 1428   LDLCALC 96 10/18/2023 1036    CBC    Component Value Date/Time   WBC 6.3 04/06/2024 0944   RBC 4.28 04/06/2024 0944   HGB 14.2 04/06/2024 0944  HGB 12.6 11/06/2021 1046   HCT 41.7 04/06/2024 0944   HCT 38.7 11/06/2021 1046   PLT 250.0 04/06/2024 0944   PLT 244 11/06/2021 1046   MCV 97.3 04/06/2024 0944   MCV 101 (H) 11/06/2021 1046   MCH 33.5 02/02/2024 0941   MCHC 34.0 04/06/2024 0944   RDW 12.7 04/06/2024 0944   RDW 13.1 11/06/2021 1046   LYMPHSABS 3.6 04/06/2024 0944   LYMPHSABS 3.2 (H) 11/06/2021 1046   MONOABS 0.4 04/06/2024 0944   EOSABS 0.3 04/06/2024 0944   EOSABS 0.1 11/06/2021 1046   BASOSABS 0.0 04/06/2024 0944   BASOSABS 0.1 11/06/2021 1046    Lab Results  Component Value Date   HGBA1C 4.6 08/25/2019       Assessment & Plan Carpal tunnel syndrome Intermittent numbness and tingling in fingers, relieved by shaking hands. Differential includes diabetes and vitamin B12 deficiency. - Prescribe gabapentin  for nocturnal symptoms as needed. - Recommend wrist brace use at night. - Order vitamin B12 level and A1c to rule out deficiency and diabetes.  Corn of left foot Pain and pressure in the left foot exacerbated by pressure with visible bone prominence. - Refer to podiatrist for evaluation and management.  Right internal carotid artery stenosis Status post carotid endarterectomy Managed with Plavix  and cholesterol medication.  Secondary risk factor modification. Smoking cessation crucial    Emphysema Managed with inhalers, currently  well-controlled. Smoking cessation encouraged   Alcoholic liver cirrhosis Commended on abstinence from alcohol  Monitoring for potential complications.  Gastroesophageal reflux disease (GERD) Managed with famotidine  and pantoprazole . Occasional morning nausea managed with ondansetron . - Refill ondansetron  for nausea management.  Tobacco use disorder Currently smoking six cigarettes per day. Smoking cessation important to reduce cardiovascular risks. Previous success with nicotine  patches. - Refill nicotine  patches to aid in smoking cessation.         Healthcare maintenance Need for influenza vaccine  - Administer flu shot.  Meds ordered this encounter  Medications   nicotine  (NICODERM CQ  - DOSED IN MG/24 HOURS) 14 mg/24hr patch    Sig: Place 1 patch (14 mg total) onto the skin daily as needed (nicotine  cessation).    Dispense:  28 patch    Refill:  1   ondansetron  (ZOFRAN -ODT) 4 MG disintegrating tablet    Sig: Take 1 tablet (4 mg total) by mouth every 8 (eight) hours as needed for nausea or vomiting.    Dispense:  20 tablet    Refill:  0    Follow-up: Return in about 6 months (around 10/26/2024) for Chronic medical conditions.       Corrina Sabin, MD, FAAFP. [facility] [place], [place] [phone]   04/28/2024, 11:35 AM

## 2024-04-28 NOTE — Patient Instructions (Signed)
 VISIT SUMMARY:  During your visit, we discussed several health concerns including numbness and tingling in your fingers, foot pain, carotid artery stenosis, COPD, hyperlipidemia, liver cirrhosis, GERD, and smoking cessation. We reviewed your current medications and made some adjustments to better manage your symptoms and overall health.  YOUR PLAN:  -CARPAL TUNNEL SYNDROME: Carpal tunnel syndrome is a condition that causes numbness and tingling in the hand and arm. It is often caused by a pinched nerve in the wrist. We will prescribe gabapentin  for nighttime symptoms and recommend using a wrist splint at night. We will also check your vitamin B12 levels and A1c to rule out deficiency and diabetes.  -PAINFUL LEFT FOOT STATUS : You are experiencing pain and pressure in your left foot which is worsened by pressure and has a visible bone prominence. We will refer you to a podiatrist for further evaluation and management.  -RIGHT INTERNAL CAROTID ARTERY STENOSIS: This condition involves the narrowing of the carotid artery, which can increase the risk of stroke and heart attack. It is being managed with Plavix  and cholesterol medication. Quitting smoking is crucial to prevent further plaque buildup.  -CHRONIC OBSTRUCTIVE PULMONARY DISEASE (COPD): COPD is a chronic inflammatory lung disease that obstructs airflow from the lungs. Your condition is currently well-controlled with inhalers.  -HYPERLIPIDEMIA: Hyperlipidemia is the presence of high levels of fats (lipids) in the blood, which can increase the risk of heart disease. It is being managed with cholesterol medication, and it is important to adhere to this treatment.  -ALCOHOLIC LIVER CIRRHOSIS: This is a severe liver condition caused by long-term alcohol  abuse, leading to liver damage. We will continue to monitor for potential complications.  -GASTROESOPHAGEAL REFLUX DISEASE (GERD): GERD is a digestive disorder that affects the lower esophageal  sphincter, leading to acid reflux and heartburn. It is being managed with famotidine  and pantoprazole . We will refill your ondansetron  prescription to help manage occasional morning nausea.  -TOBACCO USE DISORDER: This refers to dependence on tobacco, which can lead to various health issues. You are currently smoking six cigarettes per day. Quitting smoking is important to reduce cardiovascular risks. We will refill your nicotine  patches to help you quit smoking.  INSTRUCTIONS:  We will order tests to check your vitamin B12 levels and A1c. You will be referred to a podiatrist for your foot pain. Please continue taking your current medications as prescribed. We will refill your ondansetron  and nicotine  patches. Additionally, you will receive a flu shot today.

## 2024-04-29 ENCOUNTER — Ambulatory Visit: Payer: Self-pay | Admitting: Family Medicine

## 2024-04-29 LAB — VITAMIN B12: Vitamin B-12: 520 pg/mL (ref 232–1245)

## 2024-05-04 NOTE — Telephone Encounter (Signed)
 Left message for patient to call back

## 2024-05-04 NOTE — Telephone Encounter (Signed)
 Patient called back. She states that she no longer has diarrhea following completion of vancomycin  for  C difficile infection a couple weeks ago.

## 2024-05-05 ENCOUNTER — Ambulatory Visit: Payer: MEDICAID | Admitting: Family Medicine

## 2024-05-05 ENCOUNTER — Encounter: Payer: Self-pay | Admitting: *Deleted

## 2024-05-08 ENCOUNTER — Ambulatory Visit
Admission: RE | Admit: 2024-05-08 | Discharge: 2024-05-08 | Disposition: A | Discharge status: Home / Self Care | Payer: MEDICAID | Source: Ambulatory Visit | Attending: Family Medicine | Admitting: Family Medicine

## 2024-05-08 DIAGNOSIS — Z1231 Encounter for screening mammogram for malignant neoplasm of breast: Secondary | ICD-10-CM

## 2024-05-11 ENCOUNTER — Ambulatory Visit: Payer: MEDICAID | Admitting: Family Medicine

## 2024-05-14 ENCOUNTER — Other Ambulatory Visit: Payer: Self-pay | Admitting: Family Medicine

## 2024-05-14 DIAGNOSIS — Z634 Disappearance and death of family member: Secondary | ICD-10-CM

## 2024-05-18 ENCOUNTER — Telehealth: Payer: Self-pay | Admitting: Family Medicine

## 2024-05-18 ENCOUNTER — Other Ambulatory Visit (INDEPENDENT_AMBULATORY_CARE_PROVIDER_SITE_OTHER): Payer: MEDICAID

## 2024-05-18 DIAGNOSIS — K860 Alcohol-induced chronic pancreatitis: Secondary | ICD-10-CM | POA: Diagnosis not present

## 2024-05-18 DIAGNOSIS — R1013 Epigastric pain: Secondary | ICD-10-CM | POA: Diagnosis not present

## 2024-05-18 DIAGNOSIS — R197 Diarrhea, unspecified: Secondary | ICD-10-CM | POA: Diagnosis not present

## 2024-05-18 LAB — HEPATIC FUNCTION PANEL
ALT: 19 U/L (ref 0–35)
AST: 20 U/L (ref 0–37)
Albumin: 4.4 g/dL (ref 3.5–5.2)
Alkaline Phosphatase: 78 U/L (ref 39–117)
Bilirubin, Direct: 0.1 mg/dL (ref 0.0–0.3)
Total Bilirubin: 0.6 mg/dL (ref 0.2–1.2)
Total Protein: 7.5 g/dL (ref 6.0–8.3)

## 2024-05-18 LAB — LIPASE: Lipase: 75 U/L — ABNORMAL HIGH (ref 11.0–59.0)

## 2024-05-18 NOTE — Telephone Encounter (Signed)
 1st attempt phone cannot be comp as dial  pcp not in the office resch appt

## 2024-05-18 NOTE — Telephone Encounter (Signed)
 2nd attempt trying to reach out top the pt to resch appt pt phone not working appt cancel

## 2024-05-19 ENCOUNTER — Ambulatory Visit: Payer: Self-pay | Admitting: Physician Assistant

## 2024-05-19 ENCOUNTER — Encounter: Payer: Self-pay | Admitting: Internal Medicine

## 2024-05-19 ENCOUNTER — Ambulatory Visit: Payer: MEDICAID | Admitting: Internal Medicine

## 2024-05-19 VITALS — BP 150/92 | HR 63 | Temp 97.0°F | Resp 16 | Ht 64.0 in

## 2024-05-19 DIAGNOSIS — R933 Abnormal findings on diagnostic imaging of other parts of digestive tract: Secondary | ICD-10-CM | POA: Diagnosis not present

## 2024-05-19 DIAGNOSIS — R1013 Epigastric pain: Secondary | ICD-10-CM | POA: Diagnosis not present

## 2024-05-19 MED ORDER — SODIUM CHLORIDE 0.9 % IV SOLN: 500.0000 mL | Freq: Once | INTRAVENOUS | Status: DC

## 2024-05-19 MED ORDER — ONDANSETRON 4 MG PO TBDP: 4.0000 mg | ORAL_TABLET | Freq: Three times a day (TID) | ORAL | 1 refills | Status: DC | PRN

## 2024-05-19 NOTE — Patient Instructions (Addendum)
 RESUME PLAVIX  TODAY AT PREVIOUS DOSE.  Resume previous diet.  Continue present medications.  YOU HAD AN ENDOSCOPIC PROCEDURE TODAY AT THE [facility]:   Refer to the procedure report that was given to you for any specific questions about what was found during the examination.  If the procedure report does not answer your questions, please call your gastroenterologist to clarify.  If you requested that your care partner not be given the details of your procedure findings, then the procedure report has been included in a sealed envelope for you to review at your convenience later.  YOU SHOULD EXPECT: Some feelings of bloating in the abdomen. Passage of more gas than usual.  Walking can help get rid of the air that was put into your GI tract during the procedure and reduce the bloating. If you had a lower endoscopy (such as a colonoscopy or flexible sigmoidoscopy) you may notice spotting of blood in your stool or on the toilet paper. If you underwent a bowel prep for your procedure, you may not have a normal bowel movement for a few days.  Please Note:  You might notice some irritation and congestion in your nose or some drainage.  This is from the oxygen used during your procedure.  There is no need for concern and it should clear up in a day or so.  SYMPTOMS TO REPORT IMMEDIATELY:  Following upper endoscopy (EGD)  Vomiting of blood or coffee ground material  New chest pain or pain under the shoulder blades  Painful or persistently difficult swallowing  New shortness of breath  Fever of 100F or higher  Black, tarry-looking stools  For urgent or emergent issues, a gastroenterologist can be reached at any hour by calling [phone]. Do not use MyChart messaging for urgent concerns.    DIET:  We do recommend a small meal at first, but then you may proceed to your regular diet.  Drink plenty of fluids but you should avoid alcoholic beverages for 24 hours.  ACTIVITY:  You should  plan to take it easy for the rest of today and you should NOT DRIVE or use heavy machinery until tomorrow (because of the sedation medicines used during the test).    FOLLOW UP: Our staff will call the number listed on your records the next business day following your procedure.  We will call around 7:15- 8:00 am to check on you and address any questions or concerns that you may have regarding the information given to you following your procedure. If we do not reach you, we will leave a message.     If any biopsies were taken you will be contacted by phone or by letter within the next 1-3 weeks.  Please call us  at [phone] if you have not heard about the biopsies in 3 weeks.    SIGNATURES/CONFIDENTIALITY: You and/or your care partner have signed paperwork which will be entered into your electronic medical record.  These signatures attest to the fact that that the information above on your After Visit Summary has been reviewed and is understood.  Full responsibility of the confidentiality of this discharge information lies with you and/or your care-partner.

## 2024-05-19 NOTE — Progress Notes (Signed)
 Pt's states no medical or surgical changes since previsit or office visit.

## 2024-05-19 NOTE — Progress Notes (Signed)
 Sedate, gd SR, tolerated procedure well, VSS, report to RN

## 2024-05-19 NOTE — Progress Notes (Signed)
 GASTROENTEROLOGY PROCEDURE H&P NOTE   Primary Care Physician: Delbert Clam, MD    Reason for Procedure:  Abnormal CT showing gastric wall thickening  Plan:    EGD  Patient is appropriate for endoscopic procedure(s) in the ambulatory ([facility]) setting.  The nature of the procedure, as well as the risks, benefits, and alternatives were carefully and thoroughly reviewed with the patient. Ample time for discussion and questions allowed. The patient understood, was satisfied, and agreed to proceed.     HPI: Felicia Perkins is a 48 y.o. female who presents for EGD.  Medical history as below.  No recent chest pain or shortness of breath.  No abdominal pain today.  Past Medical History:  Diagnosis Date   Acute on chronic pancreatitis (HCC) 03/22/2019   Acute pancreatitis    Alcohol  abuse    Alcohol  dependence (HCC) 12/16/2012   Medical detox successful    Alcohol  induced acute pancreatitis 11/22/2017   Alcoholic cirrhosis of liver without ascites (HCC) 10/15/2016   Anemia    Anxiety    Aortic atherosclerosis    Bipolar 1 disorder (HCC)    Cancer (HCC)    cervical cancer   Chest pain with moderate risk for cardiac etiology 12/15/2014   Cirrhosis (HCC)    COPD (chronic obstructive pulmonary disease) (HCC) 09/04/2016   Coronary artery disease    Depression    Duodenitis 11/22/2017   Eczema 10/09/2018   Elevated liver enzymes 12/22/2014   Fracture of femoral neck, left (HCC) 06/04/2022   Gastritis and gastroduodenitis 09/04/2016   GERD (gastroesophageal reflux disease)    Hip fracture (HCC) 06/03/2022   Nondisplaced fracture of neck of fifth metacarpal bone, right hand, initial encounter for closed fracture 08/26/2017   Pancreatitis    Peripheral vascular disease    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   PTSD (post-traumatic stress disorder)    Tobacco abuse 09/04/2016   Tobacco abuse counseling    Vascular disease     Past Surgical History:  Procedure Laterality Date    ABDOMINAL HYSTERECTOMY  2004   BIOPSY  01/02/2018   Procedure: BIOPSY;  Surgeon: Teressa Toribio SQUIBB, MD;  Location: [facility];  Service: Endoscopy;;   COLONOSCOPY     Done in [place] she thinks. Early 20's. Normal   ENDARTERECTOMY Right 01/26/2021   Procedure: RIGHT CAROTID ARTERY ENDARTERECTOMY;  Surgeon: Sheree Penne Bruckner, MD;  Location: [facility] OR;  Service: Vascular;  Laterality: Right;   ESOPHAGOGASTRODUODENOSCOPY (EGD) WITH PROPOFOL  N/A 01/02/2018   Procedure: ESOPHAGOGASTRODUODENOSCOPY (EGD) WITH PROPOFOL ;  Surgeon: Teressa Toribio SQUIBB, MD;  Location: [facility];  Service: Endoscopy;  Laterality: N/A;   EUS N/A 01/02/2018   Procedure: UPPER ENDOSCOPIC ULTRASOUND (EUS) RADIAL;  Surgeon: Teressa Toribio SQUIBB, MD;  Location: [facility];  Service: Endoscopy;  Laterality: N/A;   FOOT SURGERY     HIP PINNING,CANNULATED Left 06/05/2022   Procedure: PERCUTANEOUS FIXATION OF FEMORAL NECK;  Surgeon: Ernie Cough, MD;  Location: [facility] OR;  Service: Orthopedics;  Laterality: Left;   OTHER SURGICAL HISTORY     ovarian surgery   TOTAL HIP ARTHROPLASTY Left 04/18/2023   Procedure: TOTAL HIP ARTHROPLASTY ANTERIOR APPROACH;  Surgeon: Ernie Cough, MD;  Location: [facility];  Service: Orthopedics;  Laterality: Left;   TUBAL LIGATION      Prior to Admission medications   Medication Sig Start Date End Date Taking? Authorizing Provider  fluconazole  (DIFLUCAN ) 150 MG tablet Take 150 mg by mouth once. 04/24/24  Yes [provider]  albuterol  (VENTOLIN  HFA) 108 (90  Base) MCG/ACT inhaler Inhale 1-2 puffs into the lungs every 6 (six) hours as needed for wheezing or shortness of breath. 10/16/23   Newlin, Enobong, MD  clopidogrel  (PLAVIX ) 75 MG tablet Take 1 tablet (75 mg total) by mouth daily. 02/05/24   Tobb, Kardie, DO  famotidine  (PEPCID ) 20 MG tablet Take 1 tablet (20 mg total) by mouth 2 (two) times daily. 12/09/23   Newlin, Enobong, MD  gabapentin  (NEURONTIN ) 300 MG capsule Take 1 capsule (300 mg total)  by mouth at bedtime. 04/28/24   Newlin, Enobong, MD  lidocaine  (XYLOCAINE ) 2 % solution Use as directed 15 mLs in the mouth or throat every 6 (six) hours as needed (Severe acid reflux symptoms). Mix with 15 mLs of Mylanta and drink. Do not eat or drink for 45 minutes after taking this medication. 02/02/24   Nivia Colon, PA-C  mometasone -formoterol  (DULERA ) 200-5 MCG/ACT AERO Inhale 2 puffs into the lungs 2 (two) times daily. 10/16/23   Newlin, Enobong, MD  nicotine  (NICODERM CQ  - DOSED IN MG/24 HOURS) 14 mg/24hr patch Place 1 patch (14 mg total) onto the skin daily as needed (nicotine  cessation). 04/28/24   Newlin, Enobong, MD  ondansetron  (ZOFRAN -ODT) 4 MG disintegrating tablet Take 1 tablet (4 mg total) by mouth every 8 (eight) hours as needed for nausea or vomiting. 04/28/24   Newlin, Enobong, MD  pantoprazole  (PROTONIX ) 40 MG tablet Take 1 tablet (40 mg total) by mouth daily. 03/26/24   [place], PA-C  polyethylene glycol (MIRALAX  / GLYCOLAX ) 17 g packet Take 17 g by mouth daily. Patient taking differently: Take 17 g by mouth daily as needed for moderate constipation. 06/07/22   Patti Rosina SAUNDERS, PA-C  rosuvastatin  (CRESTOR ) 10 MG tablet TAKE 1 TABLET BY MOUTH EVERY DAY 04/14/24   Tobb, Kardie, DO  triamcinolone  cream (KENALOG ) 0.1 % APPLY TO AFFECTED AREA TWICE A DAY 12/31/23   Newlin, Enobong, MD    Current Outpatient Medications  Medication Sig Dispense Refill   fluconazole  (DIFLUCAN ) 150 MG tablet Take 150 mg by mouth once.     albuterol  (VENTOLIN  HFA) 108 (90 Base) MCG/ACT inhaler Inhale 1-2 puffs into the lungs every 6 (six) hours as needed for wheezing or shortness of breath. 8.5 g 6   clopidogrel  (PLAVIX ) 75 MG tablet Take 1 tablet (75 mg total) by mouth daily. 30 tablet 1   famotidine  (PEPCID ) 20 MG tablet Take 1 tablet (20 mg total) by mouth 2 (two) times daily. 180 tablet 1   gabapentin  (NEURONTIN ) 300 MG capsule Take 1 capsule (300 mg total) by mouth at bedtime. 30 capsule 1    lidocaine  (XYLOCAINE ) 2 % solution Use as directed 15 mLs in the mouth or throat every 6 (six) hours as needed (Severe acid reflux symptoms). Mix with 15 mLs of Mylanta and drink. Do not eat or drink for 45 minutes after taking this medication. 100 mL 0   mometasone -formoterol  (DULERA ) 200-5 MCG/ACT AERO Inhale 2 puffs into the lungs 2 (two) times daily. 13 g 6   nicotine  (NICODERM CQ  - DOSED IN MG/24 HOURS) 14 mg/24hr patch Place 1 patch (14 mg total) onto the skin daily as needed (nicotine  cessation). 28 patch 1   ondansetron  (ZOFRAN -ODT) 4 MG disintegrating tablet Take 1 tablet (4 mg total) by mouth every 8 (eight) hours as needed for nausea or vomiting. 20 tablet 0   pantoprazole  (PROTONIX ) 40 MG tablet Take 1 tablet (40 mg total) by mouth daily. 90 tablet 3   polyethylene glycol (MIRALAX  /  GLYCOLAX ) 17 g packet Take 17 g by mouth daily. (Patient taking differently: Take 17 g by mouth daily as needed for moderate constipation.) 14 each 0   rosuvastatin  (CRESTOR ) 10 MG tablet TAKE 1 TABLET BY MOUTH EVERY DAY 15 tablet 0   triamcinolone  cream (KENALOG ) 0.1 % APPLY TO AFFECTED AREA TWICE A DAY 30 g 1   Current Facility-Administered Medications  Medication Dose Route Frequency Provider Last Rate Last Admin   0.9 %  sodium chloride  infusion  500 mL Intravenous Once Keegen Heffern, Gordy HERO, MD        Allergies as of 05/19/2024   (No Known Allergies)    Family History  Problem Relation Age of Onset   Hypertension Mother    Breast cancer Mother 101   Pancreatitis Mother    Hypertension Father    Heart attack Cousin 30       died in 58s of MI   Breast cancer Maternal Aunt    Breast cancer Maternal Aunt    Colon cancer Neg Hx    Esophageal cancer Neg Hx    Rectal cancer Neg Hx    Stomach cancer Neg Hx     Social History   Socioeconomic History   Marital status: Single    Spouse name: Not on file   Number of children: 3   Years of education: Not on file   Highest education level: 12th grade   Occupational History   Occupation: homemaker  Tobacco Use   Smoking status: Every Day    Current packs/day: 0.25    Types: Cigarettes   Smokeless tobacco: Never   Tobacco comments:    5-6     cigarettes daily  Vaping Use   Vaping status: Never Used  Substance and Sexual Activity   Alcohol  use: Yes    Comment: used to drink 2 glasses a day but not anymore   Drug use: No   Sexual activity: Yes    Birth control/protection: Condom  Other Topics Concern   Not on file  Social History Narrative   Not on file   Social Drivers of Health   Financial Resource Strain: High Risk (04/26/2024)   Overall Financial Resource Strain (CARDIA)    Difficulty of Paying Living Expenses: Hard  Food Insecurity: Food Insecurity Present (04/26/2024)   Hunger Vital Sign    Worried About Running Out of Food in the Last Year: Sometimes true    Ran Out of Food in the Last Year: Sometimes true  Transportation Needs: No Transportation Needs (04/26/2024)   PRAPARE - Administrator, Civil Service (Medical): No    Lack of Transportation (Non-Medical): No  Physical Activity: Inactive (04/26/2024)   Exercise Vital Sign    Days of Exercise per Week: 2 days    Minutes of Exercise per Session: 0 min  Stress: No Stress Concern Present (04/26/2024)   [employer] of Occupational Health - Occupational Stress Questionnaire    Feeling of Stress: Only a little  Social Connections: Moderately Integrated (04/26/2024)   Social Connection and Isolation Panel    Frequency of Communication with Friends and Family: Once a week    Frequency of Social Gatherings with Friends and Family: Never    Attends Religious Services: 1 to 4 times per year    Active Member of [employer] or Organizations: Yes    Attends Engineer, structural: More than 4 times per year    Marital Status: Living with partner  Intimate Partner Violence: Not At  Risk (04/18/2023)   Humiliation, Afraid, Rape, and Kick questionnaire    Fear  of Current or Ex-Partner: No    Emotionally Abused: No    Physically Abused: No    Sexually Abused: No    Physical Exam: Vital signs in last 24 hours: @BP  139/85   Pulse 70   Temp (!) 97 F (36.1 C)   Ht 5' 4 (1.626 m)   BMI 19.12 kg/m  GEN: NAD EYE: Sclerae anicteric ENT: MMM CV: Non-tachycardic Pulm: CTA b/l GI: Soft, NT/ND NEURO:  Alert & Oriented x 3   Gordy Starch, MD [facility]  05/19/2024 1:55 PM

## 2024-05-19 NOTE — Op Note (Addendum)
 [facility] Patient Name: Felicia Perkins Procedure Date: 05/19/2024 1:52 PM MRN: [id number] Endoscopist: Gordy CHRISTELLA Starch , MD, [id number] Age: 48 Referring MD:  Date of Birth: 1976/06/05 Gender: Female Account #: [id number] Procedure:                Upper GI endoscopy Indications:              Epigastric abdominal pain, Abnormal CT of the GI                            tract (suggestive of gastric antrum and duodenal                            bulb thickening) Medicines:                Monitored Anesthesia Care Procedure:                Pre-Anesthesia Assessment:                           - Prior to the procedure, a History and Physical                            was performed, and patient medications and                            allergies were reviewed. The patient's tolerance of                            previous anesthesia was also reviewed. The risks                            and benefits of the procedure and the sedation                            options and risks were discussed with the patient.                            All questions were answered, and informed consent                            was obtained. Prior Anticoagulants: The patient has                            taken Plavix  (clopidogrel ), last dose was 5 days                            prior to procedure. ASA Grade Assessment: III - A                            patient with severe systemic disease. After                            reviewing the risks and benefits, the patient was  deemed in satisfactory condition to undergo the                            procedure.                           After obtaining informed consent, the endoscope was                            passed under direct vision. Throughout the                            procedure, the patient's blood pressure, pulse, and                            oxygen saturations were monitored continuously. The                             Olympus Scope [id number] was introduced through the                            mouth, and advanced to the second part of duodenum.                            The upper GI endoscopy was accomplished without                            difficulty. The patient tolerated the procedure                            well. Scope In: Scope Out: Findings:                 The esophagus was normal.                           The stomach was normal.                           The examined duodenum was normal. Complications:            No immediate complications. Estimated Blood Loss:     Estimated blood loss: none. Impression:               - Normal esophagus.                           - Normal stomach.                           - Normal examined duodenum.                           - No specimens collected. Recommendation:           - Patient has a contact number available for                            emergencies.  The signs and symptoms of potential                            delayed complications were discussed with the                            patient. Return to normal activities tomorrow.                            Written discharge instructions were provided to the                            patient.                           - Resume previous diet.                           - Resume Plavix  (clopidogrel ) at prior dose today.                            Refer to managing physician for further adjustment                            of therapy.                           - Continue present medications.                           - Abdominal pain likely secondary to chronic                            pancreatitis. Gordy CHRISTELLA Starch, MD 05/19/2024 2:08:36 PM This report has been signed electronically.

## 2024-05-20 ENCOUNTER — Telehealth: Payer: Self-pay | Admitting: *Deleted

## 2024-05-20 NOTE — Telephone Encounter (Signed)
  Follow up Call-     05/19/2024    1:19 PM 01/24/2022    9:08 AM  Call back number  Post procedure Call Back phone  # [phone] ok to speak to [facility]  Permission to leave phone message Yes Yes     Patient questions:  Do you have a fever, pain , or abdominal swelling? No. Pain Score  0 *  Have you tolerated food without any problems? Yes.    Have you been able to return to your normal activities? Yes.    Do you have any questions about your discharge instructions: Diet   No. Medications  No. Follow up visit  No.  Do you have questions or concerns about your Care? No.  Actions: * If pain score is 4 or above: No action needed, pain <4.

## 2024-05-21 ENCOUNTER — Ambulatory Visit: Payer: MEDICAID | Admitting: Family Medicine

## 2024-05-26 ENCOUNTER — Ambulatory Visit (INDEPENDENT_AMBULATORY_CARE_PROVIDER_SITE_OTHER): Payer: MEDICAID | Admitting: Podiatry

## 2024-05-26 VITALS — Ht 64.0 in | Wt 111.4 lb

## 2024-05-26 DIAGNOSIS — D2372 Other benign neoplasm of skin of left lower limb, including hip: Secondary | ICD-10-CM

## 2024-05-28 NOTE — Progress Notes (Signed)
  Subjective:  Patient ID: Felicia Perkins, female    DOB: 08-19-1975,  MRN: [id number]  Chief Complaint  Patient presents with   Callouses    Rm 4 Patient is here for left foot callus on lateral side of foot.    48 y.o. female presents with the above complaint. History confirmed with patient.   Objective:  Physical Exam: warm, good capillary refill, no trophic changes or ulcerative lesions, normal DP and PT pulses, normal sensory exam, and left submetatarsal 5 verruca plantaris  Assessment:  No diagnosis found.   Plan:  Patient was evaluated and treated and all questions answered.   Discussed etiology and treatment of verruca plantaris in detail with the patient as well as multiple treatment options including blistering agents, chemotherapeutic agents, surgical excision, laser therapy and the indications and roles of the above.  Today, recommended treatment with Cantharone as noted in procedure note below.     Procedure: Destruction of Lesion Location: Left submetatarsal 5 Instrumentation: 15 blade. Technique: Debridement of lesion to petechial bleeding. Aperture pad applied around lesion. Small amount of canthrone applied to the base of the lesion. Dressing: Dry, sterile, compression dressing. Disposition: Patient tolerated procedure well. Advised to leave dressing on for overnight hours. Thereafter patient to wash the area with soap and water  and applied band-aid.   Patient to return in 4 weeks for follow-up.   Return in about 4 weeks (around 06/23/2024) for wart treatment.

## 2024-06-01 ENCOUNTER — Other Ambulatory Visit: Payer: Self-pay | Admitting: Medical Genetics

## 2024-06-01 DIAGNOSIS — Z006 Encounter for examination for normal comparison and control in clinical research program: Secondary | ICD-10-CM

## 2024-06-03 ENCOUNTER — Encounter: Payer: Self-pay | Admitting: Family Medicine

## 2024-06-04 ENCOUNTER — Other Ambulatory Visit: Payer: Self-pay | Admitting: Family Medicine

## 2024-06-04 MED ORDER — HYDROXYZINE HCL 25 MG PO TABS: 25.0000 mg | ORAL_TABLET | Freq: Three times a day (TID) | ORAL | 1 refills | Status: AC | PRN

## 2024-06-19 ENCOUNTER — Other Ambulatory Visit: Payer: Self-pay

## 2024-06-19 MED ORDER — ONDANSETRON 4 MG PO TBDP: 4.0000 mg | ORAL_TABLET | Freq: Three times a day (TID) | ORAL | 1 refills | Status: DC | PRN

## 2024-06-20 ENCOUNTER — Emergency Department (HOSPITAL_COMMUNITY)
Admission: EM | Admit: 2024-06-20 | Discharge: 2024-06-20 | Disposition: A | Discharge status: Home / Self Care | Payer: MEDICAID | Attending: Emergency Medicine | Admitting: Emergency Medicine

## 2024-06-20 ENCOUNTER — Emergency Department (HOSPITAL_COMMUNITY): Payer: MEDICAID

## 2024-06-20 ENCOUNTER — Other Ambulatory Visit: Payer: Self-pay

## 2024-06-20 ENCOUNTER — Encounter (HOSPITAL_COMMUNITY): Payer: Self-pay

## 2024-06-20 DIAGNOSIS — Z7902 Long term (current) use of antithrombotics/antiplatelets: Secondary | ICD-10-CM | POA: Insufficient documentation

## 2024-06-20 DIAGNOSIS — J449 Chronic obstructive pulmonary disease, unspecified: Secondary | ICD-10-CM | POA: Diagnosis not present

## 2024-06-20 DIAGNOSIS — R748 Abnormal levels of other serum enzymes: Secondary | ICD-10-CM

## 2024-06-20 DIAGNOSIS — K859 Acute pancreatitis without necrosis or infection, unspecified: Secondary | ICD-10-CM

## 2024-06-20 DIAGNOSIS — I2510 Atherosclerotic heart disease of native coronary artery without angina pectoris: Secondary | ICD-10-CM | POA: Insufficient documentation

## 2024-06-20 DIAGNOSIS — K824 Cholesterolosis of gallbladder: Secondary | ICD-10-CM

## 2024-06-20 DIAGNOSIS — R1013 Epigastric pain: Secondary | ICD-10-CM | POA: Diagnosis present

## 2024-06-20 DIAGNOSIS — K3580 Unspecified acute appendicitis: Secondary | ICD-10-CM | POA: Insufficient documentation

## 2024-06-20 LAB — URINALYSIS, ROUTINE W REFLEX MICROSCOPIC
Bilirubin Urine: NEGATIVE
Glucose, UA: NEGATIVE mg/dL
Hgb urine dipstick: NEGATIVE
Ketones, ur: NEGATIVE mg/dL
Leukocytes,Ua: NEGATIVE
Nitrite: NEGATIVE
Protein, ur: NEGATIVE mg/dL
Specific Gravity, Urine: 1.004 — ABNORMAL LOW (ref 1.005–1.030)
pH: 7 (ref 5.0–8.0)

## 2024-06-20 LAB — COMPREHENSIVE METABOLIC PANEL WITH GFR
ALT: 174 U/L — ABNORMAL HIGH (ref 0–44)
AST: 321 U/L — ABNORMAL HIGH (ref 15–41)
Albumin: 4.5 g/dL (ref 3.5–5.0)
Alkaline Phosphatase: 162 U/L — ABNORMAL HIGH (ref 38–126)
Anion gap: 10 (ref 5–15)
BUN: <5 mg/dL — ABNORMAL LOW (ref 6–20)
CO2: 26 mmol/L (ref 22–32)
Calcium: 9.8 mg/dL (ref 8.9–10.3)
Chloride: 102 mmol/L (ref 98–111)
Creatinine, Ser: 0.6 mg/dL (ref 0.44–1.00)
GFR, Estimated: >60 mL/min (ref >60)
Glucose, Bld: 99 mg/dL (ref 70–99)
Potassium: 3.7 mmol/L (ref 3.5–5.1)
Sodium: 137 mmol/L (ref 135–145)
Total Bilirubin: 0.6 mg/dL (ref 0.0–1.2)
Total Protein: 7.6 g/dL (ref 6.5–8.1)

## 2024-06-20 LAB — CBC
HCT: 41.5 % (ref 36.0–46.0)
Hemoglobin: 14.3 g/dL (ref 12.0–15.0)
MCH: 33.1 pg (ref 26.0–34.0)
MCHC: 34.5 g/dL (ref 30.0–36.0)
MCV: 96.1 fL (ref 80.0–100.0)
Platelets: 242 K/uL (ref 150–400)
RBC: 4.32 MIL/uL (ref 3.87–5.11)
RDW: 13.5 % (ref 11.5–15.5)
WBC: 7.8 K/uL (ref 4.0–10.5)
nRBC: 0 % (ref 0.0–0.2)

## 2024-06-20 LAB — HEPATITIS PANEL, ACUTE
HCV Ab: NONREACTIVE
Hep A IgM: NONREACTIVE
Hep B C IgM: NONREACTIVE
Hepatitis B Surface Ag: NONREACTIVE

## 2024-06-20 LAB — LIPASE, BLOOD: Lipase: 145 U/L — ABNORMAL HIGH (ref 11–51)

## 2024-06-20 MED ORDER — IOHEXOL 300 MG/ML  SOLN
80.0000 mL | Freq: Once | INTRAMUSCULAR | Status: AC | PRN
  Administered 2024-06-20: 80 mL via INTRAVENOUS

## 2024-06-20 MED ORDER — IOHEXOL 350 MG/ML SOLN: 75.0000 mL | Freq: Once | INTRAVENOUS | Status: DC | PRN

## 2024-06-20 MED ORDER — ONDANSETRON 4 MG PO TBDP: 4.0000 mg | ORAL_TABLET | Freq: Three times a day (TID) | ORAL | 0 refills | Status: DC | PRN

## 2024-06-20 MED ORDER — MORPHINE SULFATE (PF) 4 MG/ML IV SOLN
4.0000 mg | Freq: Once | INTRAVENOUS | Status: AC
  Administered 2024-06-20: 4 mg via INTRAVENOUS
  Filled 2024-06-20: qty 1

## 2024-06-20 MED ORDER — OXYCODONE HCL 5 MG PO TABS: 5.0000 mg | ORAL_TABLET | ORAL | 0 refills | Status: AC | PRN

## 2024-06-20 MED ORDER — PANTOPRAZOLE SODIUM 40 MG IV SOLR
40.0000 mg | Freq: Once | INTRAVENOUS | Status: AC
  Administered 2024-06-20: 40 mg via INTRAVENOUS
  Filled 2024-06-20: qty 10

## 2024-06-20 MED ORDER — ONDANSETRON HCL 4 MG/2ML IJ SOLN
4.0000 mg | Freq: Once | INTRAMUSCULAR | Status: AC
  Administered 2024-06-20: 4 mg via INTRAVENOUS
  Filled 2024-06-20: qty 2

## 2024-06-20 MED ORDER — SODIUM CHLORIDE 0.9 % IV BOLUS
1000.0000 mL | Freq: Once | INTRAVENOUS | Status: AC
  Administered 2024-06-20: 1000 mL via INTRAVENOUS

## 2024-06-20 NOTE — Discharge Instructions (Addendum)
 As discussed, you need to follow-up with your GI provider in the next 48-72 hours.  Seek emergency care if experiencing any new or worsening symptoms.

## 2024-06-20 NOTE — ED Triage Notes (Signed)
 Pt reports abd pain since yesterday, has had diarrhea then constipation. Thrown up from pain. Had colitis in August. Chronic pancreatitis. Tried mylanta today.

## 2024-06-20 NOTE — ED Provider Notes (Signed)
 [place] EMERGENCY DEPARTMENT AT [facility] Provider Note   CSN: [id number] Arrival date & time: 06/20/24  1451     Patient presents with: Abdominal Pain   Felicia Perkins is a 48 y.o. female with PMHx chronic pancreatitis, COPD, anemia, CAD, GERD who presents to ED concerned for epigastric pain since yesterday. Patient concerned for pancreatitis flare. Also with nausea and vomiting - denies hematemesis. Also endorses hx of constipation and then has diarrhea after taking MiraLax . Last BM today. Tylenol  and Mylanta not helping with pain. Pain does not radiate.  Endorses starting Gabapentin  3 weeks ago. No other new medications. Last EtOH drink was on Halloween.  Denies fever, chest pain, dyspnea, cough, dysuria, hematuria, hematochezia.     Abdominal Pain      Prior to Admission medications   Medication Sig Start Date End Date Taking? Authorizing Provider  ondansetron  (ZOFRAN -ODT) 4 MG disintegrating tablet Take 1 tablet (4 mg total) by mouth every 8 (eight) hours as needed for nausea. 06/20/24  Yes Hoy Fraction F, PA-C  oxyCODONE  (ROXICODONE ) 5 MG immediate release tablet Take 1 tablet (5 mg total) by mouth every 4 (four) hours as needed for up to 10 doses. 06/20/24  Yes Hoy Fraction F, PA-C  albuterol  (VENTOLIN  HFA) 108 (90 Base) MCG/ACT inhaler Inhale 1-2 puffs into the lungs every 6 (six) hours as needed for wheezing or shortness of breath. 10/16/23   Newlin, Enobong, MD  clopidogrel  (PLAVIX ) 75 MG tablet Take 1 tablet (75 mg total) by mouth daily. 02/05/24   Tobb, Kardie, DO  famotidine  (PEPCID ) 20 MG tablet Take 1 tablet (20 mg total) by mouth 2 (two) times daily. 12/09/23   Newlin, Enobong, MD  fluconazole  (DIFLUCAN ) 150 MG tablet Take 150 mg by mouth once. 04/24/24   [provider]  gabapentin  (NEURONTIN ) 300 MG capsule Take 1 capsule (300 mg total) by mouth at bedtime. 04/28/24   Newlin, Enobong, MD  hydrOXYzine  (ATARAX ) 25 MG tablet Take 1  tablet (25 mg total) by mouth 3 (three) times daily as needed. 06/04/24   Newlin, Enobong, MD  lidocaine  (XYLOCAINE ) 2 % solution Use as directed 15 mLs in the mouth or throat every 6 (six) hours as needed (Severe acid reflux symptoms). Mix with 15 mLs of Mylanta and drink. Do not eat or drink for 45 minutes after taking this medication. 02/02/24   Nivia Colon, PA-C  mometasone -formoterol  (DULERA ) 200-5 MCG/ACT AERO Inhale 2 puffs into the lungs 2 (two) times daily. 10/16/23   Newlin, Enobong, MD  nicotine  (NICODERM CQ  - DOSED IN MG/24 HOURS) 14 mg/24hr patch Place 1 patch (14 mg total) onto the skin daily as needed (nicotine  cessation). 04/28/24   Newlin, Enobong, MD  pantoprazole  (PROTONIX ) 40 MG tablet Take 1 tablet (40 mg total) by mouth daily. 03/26/24   [place], PA-C  polyethylene glycol (MIRALAX  / GLYCOLAX ) 17 g packet Take 17 g by mouth daily. Patient taking differently: Take 17 g by mouth daily as needed for moderate constipation. 06/07/22   Patti Rosina SAUNDERS, PA-C  rosuvastatin  (CRESTOR ) 10 MG tablet TAKE 1 TABLET BY MOUTH EVERY DAY 04/14/24   Tobb, Kardie, DO  triamcinolone  cream (KENALOG ) 0.1 % APPLY TO AFFECTED AREA TWICE A DAY 12/31/23   Newlin, Enobong, MD    Allergies: Patient has no known allergies.    Review of Systems  Gastrointestinal:  Positive for abdominal pain.    Updated Vital Signs BP 119/83 (BP Location: Right Arm)   Pulse 65  Temp 97.8 F (36.6 C) (Oral)   Resp 18   Ht 5' 4 (1.626 m)   Wt 50.3 kg   SpO2 99%   BMI 19.05 kg/m   Physical Exam Vitals and nursing note reviewed.  Constitutional:      General: She is not in acute distress.    Appearance: She is not ill-appearing or toxic-appearing.  HENT:     Head: Normocephalic and atraumatic.     Mouth/Throat:     Mouth: Mucous membranes are moist.     Pharynx: No posterior oropharyngeal erythema.  Eyes:     General: No scleral icterus.       Right eye: No discharge.        Left eye: No discharge.      Conjunctiva/sclera: Conjunctivae normal.  Cardiovascular:     Rate and Rhythm: Normal rate and regular rhythm.     Pulses: Normal pulses.     Heart sounds: Normal heart sounds. No murmur heard. Pulmonary:     Effort: Pulmonary effort is normal. No respiratory distress.     Breath sounds: Normal breath sounds. No wheezing, rhonchi or rales.  Abdominal:     General: Abdomen is flat. Bowel sounds are normal. There is no distension.     Palpations: Abdomen is soft. There is no mass.     Tenderness: There is abdominal tenderness in the epigastric area.  Musculoskeletal:     Right lower leg: No edema.     Left lower leg: No edema.  Skin:    General: Skin is warm and dry.     Findings: No rash.  Neurological:     General: No focal deficit present.     Mental Status: She is alert and oriented to person, place, and time. Mental status is at baseline.  Psychiatric:        Mood and Affect: Mood normal.        Behavior: Behavior normal.     (all labs ordered are listed, but only abnormal results are displayed) Labs Reviewed  LIPASE, BLOOD - Abnormal; Notable for the following components:      Result Value   Lipase 145 (*)    All other components within normal limits  COMPREHENSIVE METABOLIC PANEL WITH GFR - Abnormal; Notable for the following components:   BUN <5 (*)    AST 321 (*)    ALT 174 (*)    Alkaline Phosphatase 162 (*)    All other components within normal limits  URINALYSIS, ROUTINE W REFLEX MICROSCOPIC - Abnormal; Notable for the following components:   Color, Urine STRAW (*)    Specific Gravity, Urine 1.004 (*)    All other components within normal limits  CBC  HEPATITIS PANEL, ACUTE    EKG: None  Radiology: CT ABDOMEN PELVIS W CONTRAST Result Date: 06/20/2024 EXAM: CT ABDOMEN AND PELVIS WITH CONTRAST 06/20/2024 05:43:35 PM TECHNIQUE: CT of the abdomen and pelvis was performed with the administration of intravenous contrast. 80 mL of iohexol  (OMNIPAQUE ) 300 MG/ML  solution was administered. Multiplanar reformatted images are provided for review. Automated exposure control, iterative reconstruction, and/or weight-based adjustment of the mA/kV was utilized to reduce the radiation dose to as low as reasonably achievable. COMPARISON: CT abdomen and pelvis dated 02/02/2024. CLINICAL HISTORY: Pancreatitis, acute, severe. FINDINGS: LOWER CHEST: No acute abnormality. LIVER: There is diffuse fatty infiltration of the liver. GALLBLADDER AND BILE DUCTS: Gallbladder is unremarkable. No biliary ductal dilatation. SPLEEN: No acute abnormality. PANCREAS: Pancreatic head calcifications are again seen  compatible with chronic pancreatitis. No acute inflammation identified. No ductal dilatation or fluid collection. ADRENAL GLANDS: No acute abnormality. KIDNEYS, URETERS AND BLADDER: Subcentimeter cyst in the superior pole of the left kidney. Per consensus, no follow-up is needed for simple Bosniak type 1 and 2 renal cysts, unless the patient has a malignancy history or risk factors. No stones in the kidneys or ureters. No hydronephrosis. No perinephric or periureteral stranding. Urinary bladder is unremarkable. GI AND BOWEL: Stomach demonstrates no acute abnormality. There is wall thickening of the sigmoid colon without surrounding inflammation. The appendix appears normal. There is no bowel obstruction. PERITONEUM AND RETROPERITONEUM: No ascites. No free air. VASCULATURE: Aorta is normal in caliber. There are atherosclerotic calcifications of the aorta and iliac arteries. LYMPH NODES: No lymphadenopathy. REPRODUCTIVE ORGANS: The uterus is not visualized. BONES AND SOFT TISSUES: Left hip arthroplasty is present. Small sclerotic focus in the left iliac bone is unchanged. No acute osseous abnormality. No focal soft tissue abnormality. IMPRESSION: 1. Sigmoid colon wall thickening without surrounding inflammation; correlate clinically for chronic or underdistended bowel. Correlate clinically for  acute or chronic colitis. 2. Pancreatic head calcifications compatible with chronic pancreatitis. 3. Diffuse hepatic steatosis. Electronically signed by: Greig Pique MD 06/20/2024 05:55 PM EST RP Workstation: [id number]   US  Abdomen Limited RUQ (LIVER/GB) Result Date: 06/20/2024 EXAM: Right Upper Quadrant Abdominal Ultrasound 06/20/2024 05:21:00 PM TECHNIQUE: Real-time ultrasonography of the right upper quadrant of the abdomen was performed. COMPARISON: CT abdomen and pelvis 02/02/2004. Ultrasound abdomen 01/25/2004. CLINICAL HISTORY: RUQ pain. FINDINGS: LIVER: Liver echotexture is heterogeneous and increased. No intrahepatic biliary ductal dilatation. No evidence of mass. BILIARY SYSTEM: 2 gallbladder polyps present measuring up to 5 mm. These are sessile. One of these is new from prior. No pericholecystic fluid or wall thickening. No cholelithiasis. Negative sonographic Murphy's sign. Common bile duct is within normal limits measuring 3.9 mm. RIGHT KIDNEY: The right kidney is grossly unremarkable in appearances without evidence of hydronephrosis, echogenic calculi or worrisome mass lesions. PANCREAS: Visualized portions of the pancreas are unremarkable. OTHER: No right upper quadrant ascites. IMPRESSION: 1. Heterogeneous and increased hepatic echotexture, which may reflect steatosis or other diffuse hepatocellular disease. 2. Two sessile gallbladder polyps measuring up to 5 mm, one new from prior. No follow-up is generally required for polyps 5 mm; if risk factors are present, consider ultrasound follow-up in 1 year. Electronically signed by: Greig Pique MD 06/20/2024 05:36 PM EST RP Workstation: [id number]     Procedures   Medications Ordered in the ED  morphine  (PF) 4 MG/ML injection 4 mg (4 mg Intravenous Given 06/20/24 1617)  ondansetron  (ZOFRAN ) injection 4 mg (4 mg Intravenous Given 06/20/24 1616)  pantoprazole  (PROTONIX ) injection 40 mg (40 mg Intravenous Given 06/20/24 1617)  sodium chloride   0.9 % bolus 1,000 mL (1,000 mLs Intravenous New Bag/Given 06/20/24 1621)  iohexol  (OMNIPAQUE ) 300 MG/ML solution 80 mL (80 mLs Intravenous Contrast Given 06/20/24 1735)                                    Medical Decision Making Amount and/or Complexity of Data Reviewed Labs: ordered. Radiology: ordered.  Risk Prescription drug management.    This patient presents to the ED for concern of abdominal pain, this involves an extensive number of treatment options, and is a complaint that carries with it a high risk of complications and morbidity.  The differential diagnosis includes gastroenteritis, colitis, small bowel obstruction, appendicitis,  cholecystitis, pancreatitis, nephrolithiasis, UTI, pyelonephritis   Co morbidities that complicate the patient evaluation  chronic pancreatitis, COPD, anemia, CAD, GERD   Additional history obtained:  Dr. Newlin PCP   Problem List / ED Course / Critical interventions / Medication management  Patient presented for epigastric abdominal pain, nausea, vomiting since yesterday.  Patient concern for acute pancreatitis flare.  Physical exam with epigastric tenderness to palpation.  Rest of physical exam reassuring.  Patient afebrile with stable vitals. I Ordered, and personally interpreted labs.  CBC without leukocytosis or anemia.  UA not concerning for infection.  Lipase elevated at 145.  CMP with diffusely elevated liver enzymes.   I ordered imaging studies including CT Abd/Pelvis and RUQ US . I independently visualized and interpreted imaging and I agree with the radiologist interpretation of chronic pancreatitis and gallbladder polyps. Shared all results with patient.  Answered all questions.  Shared decision making with patient who would like to be discharged home at this time.  Patient stating that her symptoms are not severe enough to warrant hospital admission.  Patient stating that she would prefer to follow-up with her outpatient GI provider.   Patient understands that she will need to follow-up with GI provider in the next week for repeat liver function tests.  Liver function test probably elevated due to acute pancreatitis flare.  I have sent a hepatitis panel which she also agrees to follow-up with GI provider for. Will prescribe oxy 5mg  and zofran  to help patient through the pancreatitis flare.  Staffed with Dr. Ginger who agrees with plan. I have reviewed the patients home medicines and have made adjustments as needed The patient has been appropriately medically screened and/or stabilized in the ED. I have low suspicion for any other emergent medical condition which would require further screening, evaluation or treatment in the ED or require inpatient management. At time of discharge the patient is hemodynamically stable and in no acute distress. I have discussed work-up results and diagnosis with patient and answered all questions. Patient is agreeable with discharge plan. We discussed strict return precautions for returning to the emergency department and they verbalized understanding.     Social Determinants of Health:  none       Final diagnoses:  Elevated liver enzymes  Acute pancreatitis, unspecified complication status, unspecified pancreatitis type    ED Discharge Orders          Ordered    oxyCODONE  (ROXICODONE ) 5 MG immediate release tablet  Every 4 hours PRN       Note to Pharmacy: Acute pancreatitis ICD10 code: K85.0   06/20/24 1916    ondansetron  (ZOFRAN -ODT) 4 MG disintegrating tablet  Every 8 hours PRN        06/20/24 1916               Beila Purdie F, PA-C 06/20/24 1917    Tegeler, Lonni PARAS, MD 06/20/24 2239

## 2024-06-23 ENCOUNTER — Ambulatory Visit: Payer: MEDICAID | Attending: Family Medicine | Admitting: Family Medicine

## 2024-06-23 ENCOUNTER — Encounter: Payer: Self-pay | Admitting: Family Medicine

## 2024-06-23 ENCOUNTER — Ambulatory Visit: Payer: MEDICAID | Admitting: Podiatry

## 2024-06-23 VITALS — BP 117/81 | HR 78 | Temp 98.6°F | Ht 64.0 in | Wt 110.4 lb

## 2024-06-23 VITALS — Ht 64.0 in | Wt 111.0 lb

## 2024-06-23 DIAGNOSIS — F341 Dysthymic disorder: Secondary | ICD-10-CM | POA: Diagnosis not present

## 2024-06-23 DIAGNOSIS — D2372 Other benign neoplasm of skin of left lower limb, including hip: Secondary | ICD-10-CM | POA: Diagnosis not present

## 2024-06-23 DIAGNOSIS — K852 Alcohol induced acute pancreatitis without necrosis or infection: Secondary | ICD-10-CM | POA: Diagnosis not present

## 2024-06-23 NOTE — Progress Notes (Signed)
 Subjective:  Patient ID: Felicia Perkins, female    DOB: [birth year]  Age: 48 y.o. MRN: [id number]  CC: Medical Management of Chronic Issues     Discussed the use of AI scribe software for clinical note transcription with the patient, who gave verbal consent to proceed.  History of Present Illness Felicia Perkins is a 48 year old female with a history of  alcoholic liver cirrhosis, chronic alcoholic pancreatitis, COPD,  R ICA stenosis (s/p R CEA in 01/2021) stenosis, tobacco abuse, s/p right carotid endarterectomy in 01/2021 due to right ICA stenosis, h/o closed fracture of the left hip status post percutaneous fixation of left femoral neck who presents with abdominal pain and elevated liver enzymes following alcohol  consumption.  She was seen at the ED 3 days ago for acute pancreatitis with elevated LFTs and lipase levels.  CT abdomen and pelvis revealed findings consistent with chronic pancreatitis, diffuse hepatic steatosis. She experiences severe, persistent abdominal pain in the upper abdomen, rated eight out of ten, beginning three days ago after alcohol  consumption. Pain is temporarily relieved by medication. She is on a liquid diet, avoiding solid food.  She consumed liquor due to emotional distress, despite typically avoiding alcohol .  She lives with her boyfriend, who drinks, and identifies this as a trigger for her alcohol  use. She acknowledges emotional challenges, particularly around her daughter's death anniversary was last month.    Past Medical History:  Diagnosis Date   Acute on chronic pancreatitis (HCC) 03/22/2019   Acute pancreatitis    Alcohol  abuse    Alcohol  dependence (HCC) 12/16/2012   Medical detox successful    Alcohol  induced acute pancreatitis 11/22/2017   Alcoholic cirrhosis of liver without ascites (HCC) 10/15/2016   Anemia    Anxiety    Aortic atherosclerosis    Bipolar 1 disorder (HCC)    Cancer (HCC)    cervical cancer   Chest pain with moderate  risk for cardiac etiology 12/15/2014   Cirrhosis (HCC)    COPD (chronic obstructive pulmonary disease) (HCC) 09/04/2016   Coronary artery disease    Depression    Duodenitis 11/22/2017   Eczema 10/09/2018   Elevated liver enzymes 12/22/2014   Fracture of femoral neck, left (HCC) 06/04/2022   Gastritis and gastroduodenitis 09/04/2016   GERD (gastroesophageal reflux disease)    Hip fracture (HCC) 06/03/2022   Nondisplaced fracture of neck of fifth metacarpal bone, right hand, initial encounter for closed fracture 08/26/2017   Pancreatitis    Peripheral vascular disease    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   PTSD (post-traumatic stress disorder)    Tobacco abuse 09/04/2016   Tobacco abuse counseling    Vascular disease     Past Surgical History:  Procedure Laterality Date   ABDOMINAL HYSTERECTOMY  2004   BIOPSY  01/02/2018   Procedure: BIOPSY;  Surgeon: Teressa Toribio SQUIBB, MD;  Location: [facility];  Service: Endoscopy;;   COLONOSCOPY     Done in [place] she thinks. Early 20's. Normal   ENDARTERECTOMY Right 01/26/2021   Procedure: RIGHT CAROTID ARTERY ENDARTERECTOMY;  Surgeon: Sheree Penne Bruckner, MD;  Location: [facility] OR;  Service: Vascular;  Laterality: Right;   ESOPHAGOGASTRODUODENOSCOPY (EGD) WITH PROPOFOL  N/A 01/02/2018   Procedure: ESOPHAGOGASTRODUODENOSCOPY (EGD) WITH PROPOFOL ;  Surgeon: Teressa Toribio SQUIBB, MD;  Location: [facility];  Service: Endoscopy;  Laterality: N/A;   EUS N/A 01/02/2018   Procedure: UPPER ENDOSCOPIC ULTRASOUND (EUS) RADIAL;  Surgeon: Teressa Toribio SQUIBB, MD;  Location: [facility];  Service: Endoscopy;  Laterality: N/A;  FOOT SURGERY     HIP PINNING,CANNULATED Left 06/05/2022   Procedure: PERCUTANEOUS FIXATION OF FEMORAL NECK;  Surgeon: Ernie Cough, MD;  Location: [facility] OR;  Service: Orthopedics;  Laterality: Left;   OTHER SURGICAL HISTORY     ovarian surgery   TOTAL HIP ARTHROPLASTY Left 04/18/2023   Procedure: TOTAL HIP ARTHROPLASTY ANTERIOR APPROACH;   Surgeon: Ernie Cough, MD;  Location: [facility];  Service: Orthopedics;  Laterality: Left;   TUBAL LIGATION      Family History  Problem Relation Age of Onset   Hypertension Mother    Breast cancer Mother 65   Pancreatitis Mother    Hypertension Father    Heart attack Cousin 30       died in 83s of MI   Breast cancer Maternal Aunt    Breast cancer Maternal Aunt    Colon cancer Neg Hx    Esophageal cancer Neg Hx    Rectal cancer Neg Hx    Stomach cancer Neg Hx     Social History   Socioeconomic History   Marital status: Single    Spouse name: Not on file   Number of children: 3   Years of education: Not on file   Highest education level: 12th grade  Occupational History   Occupation: homemaker  Tobacco Use   Smoking status: Every Day    Current packs/day: 0.25    Types: Cigarettes   Smokeless tobacco: Never   Tobacco comments:    5-6     cigarettes daily  Vaping Use   Vaping status: Never Used  Substance and Sexual Activity   Alcohol  use: Yes    Comment: used to drink 2 glasses a day but not anymore   Drug use: No   Sexual activity: Yes    Birth control/protection: Condom  Other Topics Concern   Not on file  Social History Narrative   Not on file   Social Drivers of Health   Financial Resource Strain: Medium Risk (06/23/2024)   Overall Financial Resource Strain (CARDIA)    Difficulty of Paying Living Expenses: Somewhat hard  Food Insecurity: Food Insecurity Present (06/23/2024)   Hunger Vital Sign    Worried About Running Out of Food in the Last Year: Sometimes true    Ran Out of Food in the Last Year: Sometimes true  Transportation Needs: No Transportation Needs (06/23/2024)   PRAPARE - Administrator, Civil Service (Medical): No    Lack of Transportation (Non-Medical): No  Physical Activity: Insufficiently Active (06/23/2024)   Exercise Vital Sign    Days of Exercise per Week: 2 days    Minutes of Exercise per Session: 30 min  Stress:  Stress Concern Present (06/23/2024)   [employer] of Occupational Health - Occupational Stress Questionnaire    Feeling of Stress: To some extent  Social Connections: Moderately Integrated (06/23/2024)   Social Connection and Isolation Panel    Frequency of Communication with Friends and Family: More than three times a week    Frequency of Social Gatherings with Friends and Family: Once a week    Attends Religious Services: More than 4 times per year    Active Member of [employer] or Organizations: No    Attends Engineer, Structural: Not on file    Marital Status: Living with partner    No Known Allergies  Outpatient Medications Prior to Visit  Medication Sig Dispense Refill   albuterol  (VENTOLIN  HFA) 108 (90 Base) MCG/ACT inhaler Inhale 1-2 puffs into  the lungs every 6 (six) hours as needed for wheezing or shortness of breath. 8.5 g 6   clopidogrel  (PLAVIX ) 75 MG tablet Take 1 tablet (75 mg total) by mouth daily. 30 tablet 1   famotidine  (PEPCID ) 20 MG tablet Take 1 tablet (20 mg total) by mouth 2 (two) times daily. 180 tablet 1   gabapentin  (NEURONTIN ) 300 MG capsule Take 1 capsule (300 mg total) by mouth at bedtime. 30 capsule 1   hydrOXYzine  (ATARAX ) 25 MG tablet Take 1 tablet (25 mg total) by mouth 3 (three) times daily as needed. 60 tablet 1   lidocaine  (XYLOCAINE ) 2 % solution Use as directed 15 mLs in the mouth or throat every 6 (six) hours as needed (Severe acid reflux symptoms). Mix with 15 mLs of Mylanta and drink. Do not eat or drink for 45 minutes after taking this medication. 100 mL 0   mometasone -formoterol  (DULERA ) 200-5 MCG/ACT AERO Inhale 2 puffs into the lungs 2 (two) times daily. 13 g 6   nicotine  (NICODERM CQ  - DOSED IN MG/24 HOURS) 14 mg/24hr patch Place 1 patch (14 mg total) onto the skin daily as needed (nicotine  cessation). 28 patch 1   ondansetron  (ZOFRAN -ODT) 4 MG disintegrating tablet Take 1 tablet (4 mg total) by mouth every 8 (eight) hours as needed  for nausea. 10 tablet 0   oxyCODONE  (ROXICODONE ) 5 MG immediate release tablet Take 1 tablet (5 mg total) by mouth every 4 (four) hours as needed for up to 10 doses. 10 tablet 0   pantoprazole  (PROTONIX ) 40 MG tablet Take 1 tablet (40 mg total) by mouth daily. 90 tablet 3   polyethylene glycol (MIRALAX  / GLYCOLAX ) 17 g packet Take 17 g by mouth daily. (Patient taking differently: Take 17 g by mouth daily as needed for moderate constipation.) 14 each 0   rosuvastatin  (CRESTOR ) 10 MG tablet TAKE 1 TABLET BY MOUTH EVERY DAY 15 tablet 0   triamcinolone  cream (KENALOG ) 0.1 % APPLY TO AFFECTED AREA TWICE A DAY 30 g 1   fluconazole  (DIFLUCAN ) 150 MG tablet Take 150 mg by mouth once. (Patient not taking: Reported on 06/23/2024)     No facility-administered medications prior to visit.     ROS Review of Systems  Constitutional:  Negative for activity change and appetite change.  HENT:  Negative for sinus pressure and sore throat.   Respiratory:  Negative for chest tightness, shortness of breath and wheezing.   Cardiovascular:  Negative for chest pain and palpitations.  Gastrointestinal:  Negative for abdominal distention, abdominal pain and constipation.  Genitourinary: Negative.   Musculoskeletal: Negative.   Psychiatric/Behavioral:  Positive for dysphoric mood. Negative for behavioral problems.     Objective:  BP 117/81   Pulse 78   Temp 98.6 F (37 C) (Oral)   Ht 5' 4 (1.626 m)   Wt 110 lb 6.4 oz (50.1 kg)   SpO2 98%   BMI 18.95 kg/m      06/23/2024    3:39 PM 06/23/2024   11:21 AM 06/20/2024    6:47 PM  BP/Weight  Systolic BP 117  880  Diastolic BP 81  83  Wt. (Lbs) 110.4 111   BMI 18.95 kg/m2 19.05 kg/m2       Physical Exam Constitutional:      Appearance: She is well-developed.  Cardiovascular:     Rate and Rhythm: Normal rate.     Heart sounds: Normal heart sounds. No murmur heard. Pulmonary:     Effort: Pulmonary effort is  normal.     Breath sounds: Normal  breath sounds. No wheezing or rales.  Chest:     Chest wall: No tenderness.  Abdominal:     General: Bowel sounds are normal. There is no distension.     Palpations: Abdomen is soft. There is no mass.     Tenderness: There is no abdominal tenderness.  Musculoskeletal:        General: Normal range of motion.     Right lower leg: No edema.     Left lower leg: No edema.  Neurological:     Mental Status: She is alert and oriented to person, place, and time.  Psychiatric:     Comments: Dysphoric mood        Latest Ref Rng & Units 06/20/2024    4:26 PM 05/18/2024    8:56 AM 04/06/2024    9:44 AM  CMP  Glucose 70 - 99 mg/dL 99   85   BUN 6 - 20 mg/dL <5   3   Creatinine 9.55 - 1.00 mg/dL 9.39   9.40   Sodium 864 - 145 mmol/L 137   141   Potassium 3.5 - 5.1 mmol/L 3.7   3.6   Chloride 98 - 111 mmol/L 102   101   CO2 22 - 32 mmol/L 26   28   Calcium  8.9 - 10.3 mg/dL 9.8   9.2   Total Protein 6.5 - 8.1 g/dL 7.6  7.5  7.4   Total Bilirubin 0.0 - 1.2 mg/dL 0.6  0.6  0.5   Alkaline Phos 38 - 126 U/L 162  78  85   AST 15 - 41 U/L 321  20  67   ALT 0 - 44 U/L 174  19  42     Lipid Panel     Component Value Date/Time   CHOL 199 10/18/2023 1036   CHOL 215 (H) 01/24/2022 1054   TRIG 107 10/18/2023 1036   TRIG 232 (H) 01/24/2022 1054   HDL 84 10/18/2023 1036   CHOLHDL 3.7 03/22/2019 1428   VLDL 18 03/22/2019 1428   LDLCALC 96 10/18/2023 1036    CBC    Component Value Date/Time   WBC 7.8 06/20/2024 1626   RBC 4.32 06/20/2024 1626   HGB 14.3 06/20/2024 1626   HGB 12.6 11/06/2021 1046   HCT 41.5 06/20/2024 1626   HCT 38.7 11/06/2021 1046   PLT 242 06/20/2024 1626   PLT 244 11/06/2021 1046   MCV 96.1 06/20/2024 1626   MCV 101 (H) 11/06/2021 1046   MCH 33.1 06/20/2024 1626   MCHC 34.5 06/20/2024 1626   RDW 13.5 06/20/2024 1626   RDW 13.1 11/06/2021 1046   LYMPHSABS 3.6 04/06/2024 0944   LYMPHSABS 3.2 (H) 11/06/2021 1046   MONOABS 0.4 04/06/2024 0944   EOSABS 0.3  04/06/2024 0944   EOSABS 0.1 11/06/2021 1046   BASOSABS 0.0 04/06/2024 0944   BASOSABS 0.1 11/06/2021 1046    Lab Results  Component Value Date   HGBA1C 5.1 04/28/2024       Assessment & Plan Alcohol -induced acute pancreatitis Acute pancreatitis due to alcohol  with severe abdominal pain and elevated liver enzymes. Pain managed with oxycodone . Advised liquid diet and gradual reintroduction of solids. Constipation addressed with Miralax . - Continue oxycodone  for pain management as prescribed. - Maintain liquid diet for three days, then gradually reintroduce solid foods. - Continue Miralax  twice daily to prevent constipation. - Ordered blood test in one week to monitor liver enzyme levels. -  Continue follow-up with GI specialist.   Persistent depressive disorder Exacerbated by alcohol  relapse and stressors. Hydroxyzine  prescribed for anxiety and sleep. Duloxetine declined due to past adverse effects. Support from counselor and church community anticipated. - Continue hydroxyzine  as needed for anxiety and sleep. - Encouraged follow-up with counselor for ongoing support.        No orders of the defined types were placed in this encounter.   Follow-up: Return for previously scheduled appointment.       Corrina Sabin, MD, FAAFP. [facility] [place], [place] [phone]   06/23/2024, 5:18 PM

## 2024-06-23 NOTE — Patient Instructions (Signed)
 VISIT SUMMARY:  Today, you were seen for severe abdominal pain and elevated liver enzymes following alcohol  consumption. We discussed your symptoms, current medications, and emotional challenges. We have developed a plan to address your acute pancreatitis, tobacco use, depression and anxiety, and GERD.  YOUR PLAN:  -ALCOHOL -INDUCED ACUTE PANCREATITIS: Acute pancreatitis is inflammation of the pancreas, often caused by alcohol  consumption, leading to severe abdominal pain and elevated liver enzymes. You should continue taking oxycodone  for pain management as prescribed, maintain a liquid diet for three days, and then gradually reintroduce solid foods. Continue taking Miralax  twice daily to prevent constipation. A blood test has been ordered in one week to monitor your liver enzyme levels. Please continue your follow-up with the GI specialist.  -TOBACCO USE DISORDER: Tobacco use disorder is a dependence on tobacco products. You should continue using nicotine  patches as needed to help with smoking cessation.  -DEPRESSION AND ANXIETY: Depression and anxiety are mental health conditions that can be exacerbated by stress and alcohol  use. You should continue taking hydroxyzine  as needed for anxiety and sleep. It is also encouraged to follow up with your counselor for ongoing support.  -GASTROESOPHAGEAL REFLUX DISEASE (GERD): GERD is a condition where stomach acid frequently flows back into the tube connecting your mouth and stomach, causing discomfort. You should continue taking pantoprazole  as prescribed.  INSTRUCTIONS:  Please return for a blood test in one week to monitor your liver enzyme levels. Continue your follow-up with the GI specialist and your counselor for ongoing support.

## 2024-06-26 NOTE — Progress Notes (Signed)
  Subjective:  Patient ID: Felicia Perkins, female    DOB: Mar 31, 1976,  MRN: [id number]  Chief Complaint  Patient presents with   Plantar Warts    Rm 3 Patient is here for plantar wart of the left foot.    48 y.o. female presents with the above complaint. History confirmed with patient.   Objective:  Physical Exam: warm, good capillary refill, no trophic changes or ulcerative lesions, normal DP and PT pulses, normal sensory exam, and left submetatarsal 5 verruca plantaris  Assessment:   1. Benign neoplasm of skin of left lower extremity      Plan:  Patient was evaluated and treated and all questions answered.   Discussed etiology and treatment of verruca plantaris in detail with the patient as well as multiple treatment options including blistering agents, chemotherapeutic agents, surgical excision, laser therapy and the gh  indications and roles of the above.  Today, recommended treatment with Cantharone as noted in procedure note below.     Procedure: Destruction of Lesion Location: Left submetatarsal 5 Instrumentation: 15 blade. Technique: Debridement of lesion to petechial bleeding. Aperture pad applied around lesion. Small amount of canthrone applied to the base of the lesion. Dressing: Dry, sterile, compression dressing. Disposition: Patient tolerated procedure well. Advised to leave dressing on for overnight hours. Thereafter patient to wash the area with soap and water  and applied band-aid.   Patient to return in 4 weeks for follow-up.   Return in about 4 weeks (around 07/21/2024) for wart treatment.

## 2024-07-14 ENCOUNTER — Encounter: Payer: Self-pay | Admitting: Cardiology

## 2024-07-14 ENCOUNTER — Other Ambulatory Visit: Payer: Self-pay | Admitting: Cardiology

## 2024-07-14 ENCOUNTER — Encounter: Payer: Self-pay | Admitting: Family Medicine

## 2024-07-17 ENCOUNTER — Other Ambulatory Visit: Payer: Self-pay | Admitting: *Deleted

## 2024-07-17 ENCOUNTER — Encounter: Payer: Self-pay | Admitting: Family Medicine

## 2024-07-17 NOTE — Telephone Encounter (Signed)
 Can you please assist her in placing her daughter in my schedule?  Thank you.

## 2024-07-20 ENCOUNTER — Other Ambulatory Visit: Payer: Self-pay | Admitting: Cardiology

## 2024-07-20 NOTE — Telephone Encounter (Signed)
*  STAT* If patient is at the pharmacy, call can be transferred to refill team.   1. Which medications need to be refilled? (please list name of each medication and dose if known) rosuvastatin  (CRESTOR ) 10 MG tablet    2. Would you like to learn more about the convenience, safety, & potential cost savings by using the [facility]? no  3. Are you open to using the [facility] (Type [facility]. no   4. Which pharmacy/location (including street and city if local pharmacy) is medication to be sent to?  [id number] - [place], [place] - [address]     5. Do they need a 30 day or 90 day supply? 30 day

## 2024-07-21 ENCOUNTER — Other Ambulatory Visit: Payer: Self-pay

## 2024-07-21 ENCOUNTER — Ambulatory Visit: Payer: MEDICAID | Admitting: Podiatry

## 2024-07-21 MED ORDER — CLOPIDOGREL BISULFATE 75 MG PO TABS: 75.0000 mg | ORAL_TABLET | Freq: Every day | ORAL | 0 refills | Status: DC

## 2024-07-21 MED ORDER — ROSUVASTATIN CALCIUM 10 MG PO TABS: 10.0000 mg | ORAL_TABLET | Freq: Every day | ORAL | 0 refills | Status: DC

## 2024-07-21 MED ORDER — ROSUVASTATIN CALCIUM 10 MG PO TABS: 10.0000 mg | ORAL_TABLET | Freq: Every day | ORAL | 3 refills | Status: AC

## 2024-07-21 NOTE — Telephone Encounter (Signed)
 Refill sent to pharmacy.

## 2024-08-11 ENCOUNTER — Encounter: Payer: Self-pay | Admitting: Family Medicine

## 2024-08-14 ENCOUNTER — Other Ambulatory Visit: Payer: Self-pay | Admitting: Family Medicine

## 2024-08-14 DIAGNOSIS — G8929 Other chronic pain: Secondary | ICD-10-CM

## 2024-08-22 ENCOUNTER — Encounter: Payer: Self-pay | Admitting: Family Medicine

## 2024-08-25 ENCOUNTER — Other Ambulatory Visit: Payer: Self-pay | Admitting: Family Medicine

## 2024-08-25 MED ORDER — ONDANSETRON 4 MG PO TBDP: 4.0000 mg | ORAL_TABLET | Freq: Three times a day (TID) | ORAL | 0 refills | Status: AC | PRN

## 2024-08-25 NOTE — Therapy (Unsigned)
 " OUTPATIENT PHYSICAL THERAPY THORACOLUMBAR EVALUATION   Patient Name: Felicia Perkins MRN: [id number] DOB:04-03-1976, 49 y.o., female Today's Date: 08/26/2024  END OF SESSION:  PT End of Session - 08/26/24 1524     Visit Number 1    Number of Visits 12    Date for Recertification  10/24/24    Authorization Type [employer]    PT Start Time 1450    PT Stop Time 1530    PT Time Calculation (min) 40 min    Activity Tolerance Patient tolerated treatment well    Behavior During Therapy [facility] for tasks assessed/performed          Past Medical History:  Diagnosis Date   Acute on chronic pancreatitis (HCC) 03/22/2019   Acute pancreatitis    Alcohol  abuse    Alcohol  dependence (HCC) 12/16/2012   Medical detox successful    Alcohol  induced acute pancreatitis 11/22/2017   Alcoholic cirrhosis of liver without ascites (HCC) 10/15/2016   Anemia    Anxiety    Aortic atherosclerosis    Bipolar 1 disorder (HCC)    Cancer (HCC)    cervical cancer   Chest pain with moderate risk for cardiac etiology 12/15/2014   Cirrhosis (HCC)    COPD (chronic obstructive pulmonary disease) (HCC) 09/04/2016   Coronary artery disease    Depression    Duodenitis 11/22/2017   Eczema 10/09/2018   Elevated liver enzymes 12/22/2014   Fracture of femoral neck, left (HCC) 06/04/2022   Gastritis and gastroduodenitis 09/04/2016   GERD (gastroesophageal reflux disease)    Hip fracture (HCC) 06/03/2022   Nondisplaced fracture of neck of fifth metacarpal bone, right hand, initial encounter for closed fracture 08/26/2017   Pancreatitis    Peripheral vascular disease    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   PTSD (post-traumatic stress disorder)    Tobacco abuse 09/04/2016   Tobacco abuse counseling    Vascular disease    Past Surgical History:  Procedure Laterality Date   ABDOMINAL HYSTERECTOMY  2004   BIOPSY  01/02/2018   Procedure: BIOPSY;  Surgeon: Teressa Toribio SQUIBB, MD;  Location: [facility];  Service:  Endoscopy;;   COLONOSCOPY     Done in [place] she thinks. Early 20's. Normal   ENDARTERECTOMY Right 01/26/2021   Procedure: RIGHT CAROTID ARTERY ENDARTERECTOMY;  Surgeon: Sheree Penne Bruckner, MD;  Location: [facility] OR;  Service: Vascular;  Laterality: Right;   ESOPHAGOGASTRODUODENOSCOPY (EGD) WITH PROPOFOL  N/A 01/02/2018   Procedure: ESOPHAGOGASTRODUODENOSCOPY (EGD) WITH PROPOFOL ;  Surgeon: Teressa Toribio SQUIBB, MD;  Location: [facility];  Service: Endoscopy;  Laterality: N/A;   EUS N/A 01/02/2018   Procedure: UPPER ENDOSCOPIC ULTRASOUND (EUS) RADIAL;  Surgeon: Teressa Toribio SQUIBB, MD;  Location: [facility];  Service: Endoscopy;  Laterality: N/A;   FOOT SURGERY     HIP PINNING,CANNULATED Left 06/05/2022   Procedure: PERCUTANEOUS FIXATION OF FEMORAL NECK;  Surgeon: Ernie Cough, MD;  Location: [facility] OR;  Service: Orthopedics;  Laterality: Left;   OTHER SURGICAL HISTORY     ovarian surgery   TOTAL HIP ARTHROPLASTY Left 04/18/2023   Procedure: TOTAL HIP ARTHROPLASTY ANTERIOR APPROACH;  Surgeon: Ernie Cough, MD;  Location: [facility] ORS;  Service: Orthopedics;  Laterality: Left;   TUBAL LIGATION     Patient Active Problem List   Diagnosis Date Noted   S/P total left hip arthroplasty 04/18/2023   Protein-calorie malnutrition, severe 03/13/2023   Lactic acidosis 03/13/2023   Alcoholic pancreatitis 03/11/2023   Acute on chronic pancreatitis (HCC) 03/10/2023   Intractable nausea and vomiting  10/30/2022   Malnutrition of moderate degree 06/05/2022   Essential (primary) hypertension 03/16/2021   Smoker 03/16/2021   History of carotid endarterectomy 03/16/2021   Coronary artery disease 03/02/2021   Depression 03/02/2021   Carotid artery stenosis 01/26/2021   Anxiety    Cancer (HCC)    Cirrhosis (HCC)    GERD (gastroesophageal reflux disease)    PTSD (post-traumatic stress disorder)    Vascular disease    Bipolar 1 disorder (HCC) 01/13/2019   Eczema 10/09/2018   Acute pancreatitis    Tobacco abuse  counseling    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   Alcoholic cirrhosis of liver without ascites (HCC) 10/15/2016   Alcohol  abuse 09/04/2016   Tobacco abuse 09/04/2016   COPD (chronic obstructive pulmonary disease) (HCC) 09/04/2016   Alcohol  dependence (HCC) 12/16/2012    Class: Acute    PCP: Delbert Clam, MD  REFERRING PROVIDER: Delbert Clam, MD  REFERRING DIAG: M54.50,G89.29 (ICD-10-CM) - Chronic low back pain, unspecified back pain laterality, unspecified whether sciatica present  Rationale for Evaluation and Treatment: Rehabilitation  THERAPY DIAG:  Other low back pain  Muscle weakness (generalized)  ONSET DATE: chronic  SUBJECTIVE:                                                                                                                                                                                           SUBJECTIVE STATEMENT: Reports low back pain ongoing for a year.  Symptoms began following L THA.  Reports symptoms onset after prolonged standing.  Denies radicular symptoms.  Difficulty describing distinct aggravating or relieving factors other than medication for relief.  PERTINENT HISTORY:  None available regarding lumbar  PAIN:  Are you having pain? Yes: NPRS scale: 8/10 Pain location: low back Pain description: ache Aggravating factors: prolonged standing Relieving factors: meds,   PRECAUTIONS: None  RED FLAGS: None   WEIGHT BEARING RESTRICTIONS: No  FALLS:  Has patient fallen in last 6 months? No  OCCUPATION: [employer]  PLOF: Independent  PATIENT GOALS: To manage my back pain  NEXT MD VISIT: 3/26  OBJECTIVE:  Note: Objective measures were completed at Evaluation unless otherwise noted.  DIAGNOSTIC FINDINGS:  none  PATIENT SURVEYS:  Modified Oswestry: 12/50  Interpretation of scores: Score Category Description  0-20% Minimal Disability The patient can cope with most living activities. Usually no treatment is indicated apart  from advice on lifting, sitting and exercise  21-40% Moderate Disability The patient experiences more pain and difficulty with sitting, lifting and standing. Travel and social life are more difficult and they may be disabled from work. Personal care, sexual activity and sleeping are not grossly affected, and  the patient can usually be managed by conservative means  41-60% Severe Disability Pain remains the main problem in this group, but activities of daily living are affected. These patients require a detailed investigation  61-80% Crippled Back pain impinges on all aspects of the patients life. Positive intervention is required  81-100% Bed-bound These patients are either bed-bound or exaggerating their symptoms  Bluford FORBES Zoe DELENA Karon DELENA, et al. Surgery versus conservative management of stable thoracolumbar fracture: the PRESTO feasibility RCT. [place] ([place]): [employer]; 2021 Nov. [facility] Technology Assessment, No. 25.62.) Appendix 3, Oswestry Disability Index category descriptors. Available from: Findjewelers.cz  Minimally Clinically Important Difference (MCID) = 12.8%  MUSCLE LENGTH: Hamstrings: Right 80 deg; Left 80 deg Thomas test: PKB mild restrictions L>R  POSTURE: No Significant postural limitations  PALPATION: unremarable  LUMBAR ROM:   AROM eval  Flexion 90%  Extension 25%p!  Right lateral flexion 90%  Left lateral flexion 90%  Right rotation 50%  Left rotation 50%   (Blank rows = not tested)  LOWER EXTREMITY ROM:   WNL  Active  Right eval Left eval  Hip flexion    Hip extension    Hip abduction    Hip adduction    Hip internal rotation    Hip external rotation    Knee flexion    Knee extension    Ankle dorsiflexion    Ankle plantarflexion    Ankle inversion    Ankle eversion     (Blank rows = not tested)  LOWER EXTREMITY MMT:  see 30s chair stand test  MMT Right eval Left eval  Hip flexion    Hip  extension    Hip abduction    Hip adduction    Hip internal rotation    Hip external rotation    Knee flexion    Knee extension    Ankle dorsiflexion    Ankle plantarflexion    Ankle inversion    Ankle eversion     (Blank rows = not tested)  LUMBAR SPECIAL TESTS:  Straight leg raise test: Negative and Slump test: Negative  FUNCTIONAL TESTS:  30 seconds chair stand test  GAIT: Distance walked: 80ft x2 Assistive device utilized: None Level of assistance: Complete Independence Comments: WFL  TREATMENT:                                                                                                                          [facility] Adult PT Treatment:                                                DATE: 08/26/24 Eval and HEP Self Care: Additional minutes spent for educating on updated Therapeutic Home Exercise Program as well as comparing current status to condition at start of symptoms. This included exercises focusing on stretching, strengthening, with focus on  eccentric aspects. Long term goals include an improvement in range of motion, strength, endurance as well as avoiding reinjury. Patient's frequency would include in 1-2 times a day, 3-5 times a week for a duration of 6-12 weeks. Proper technique shown and discussed handout in great detail. All questions were discussed and addressed.       PATIENT EDUCATION:  Education details: Discussed eval findings, rehab rationale and POC and patient is in agreement  Person educated: Patient Education method: Explanation and Handouts Education comprehension: verbalized understanding and needs further education  HOME EXERCISE PROGRAM: Access Code: [email or web address] URL: [email or web address] Date: 08/26/2024 Prepared by: Reyes Kohut  Exercises - Supine bridge with ball  - 2 x daily - 3-5 x weekly - 1-2 sets - 15 reps - Modified Thomas Stretch  - 2 x daily - 3-5 x weekly - 1 sets - 2 reps - 30s hold - Sit to Stand with Arms  Crossed  - 2 x daily - 3-5 x weekly - 2 sets - 5 reps  ASSESSMENT:  CLINICAL IMPRESSION: Patient is a 49 y.o. female who was seen today for physical therapy evaluation and treatment for chronic low back pain.  Patient presents with ROM restriction in lumbar region, primarily extenison and rotation, neural tension signs negative, BLE weakness evident in 30s chair stand test and soft tissue restrictions in L hip flexors most likely contributing to low back symptoms.  Patient is a good rehab candidate with treatment focused on increasing core and LE strength and regaining trunk mobility.  OBJECTIVE IMPAIRMENTS: decreased activity tolerance, decreased knowledge of condition, decreased mobility, decreased ROM, decreased strength, increased fascial restrictions, impaired perceived functional ability, improper body mechanics, and pain.   ACTIVITY LIMITATIONS: carrying, lifting, standing, and sleeping  PERSONAL FACTORS: Age, Behavior pattern, Fitness, Past/current experiences, and Time since onset of injury/illness/exacerbation are also affecting patient's functional outcome.   REHAB POTENTIAL: Good  CLINICAL DECISION MAKING: Stable/uncomplicated  EVALUATION COMPLEXITY: Moderate   GOALS: Goals reviewed with patient? No  SHORT TERM GOALS: Target date: 09/23/2024    Patient to demonstrate independence in HEP  Baseline: [id number] Goal status: INITIAL  2.  Resolve PKB limitations Baseline: mild restrictions L>R Goal status: INITIAL    LONG TERM GOALS: Target date: 10/21/2024    Patient will acknowledge 6/10 pain at least once during episode of care   Baseline: 8/10 Goal status: INITIAL  2.  Patient will score at least 8/50 on ODI to signify clinically meaningful improvement in functional abilities.   Baseline: 12/50 Goal status: INITIAL  3.  Patient will increase 30s chair stand reps from 5 to 8 with/without arms to demonstrate and improved functional ability with less pain/difficulty  as well as reduce fall risk.  Baseline: 5 Goal status: INITIAL  4.  Increase trunk ROM to 75% Baseline:  AROM eval  Flexion 90%  Extension 25%p!  Right lateral flexion 90%  Left lateral flexion 90%  Right rotation 50%  Left rotation 50%   Goal status: INITIAL    PLAN:  PT FREQUENCY: 1-2x/week  PT DURATION: 6 weeks  PLANNED INTERVENTIONS: 97110-Therapeutic exercises, 97530- Therapeutic activity, [id number]- Neuromuscular re-education, 97535- Self Care, [id number]- Manual therapy, and Patient/Family education.  PLAN FOR NEXT SESSION: HEP review and update, manual techniques as appropriate, aerobic tasks, ROM and flexibility activities, strengthening and PREs, TPDN, gait and balance training,aquatic therapy, modalities for pain and NMRE     For all possible CPT codes, reference the Planned Interventions line above.     Check all conditions  that are expected to impact treatment: {Conditions expected to impact treatment:None of these apply   If treatment provided at initial evaluation, no treatment charged due to lack of authorization.       Ramiro Pangilinan M Ehtan Delfavero, PT 08/26/2024, 3:25 PM  "

## 2024-08-26 ENCOUNTER — Other Ambulatory Visit: Payer: Self-pay

## 2024-08-26 ENCOUNTER — Ambulatory Visit: Payer: MEDICAID | Attending: Family Medicine

## 2024-08-26 ENCOUNTER — Encounter: Payer: Self-pay | Admitting: Family Medicine

## 2024-08-26 DIAGNOSIS — M5459 Other low back pain: Secondary | ICD-10-CM | POA: Diagnosis present

## 2024-08-26 DIAGNOSIS — M6281 Muscle weakness (generalized): Secondary | ICD-10-CM | POA: Diagnosis present

## 2024-08-26 DIAGNOSIS — G8929 Other chronic pain: Secondary | ICD-10-CM | POA: Insufficient documentation

## 2024-08-26 DIAGNOSIS — M545 Low back pain, unspecified: Secondary | ICD-10-CM | POA: Insufficient documentation

## 2024-09-01 ENCOUNTER — Ambulatory Visit: Payer: MEDICAID

## 2024-09-01 ENCOUNTER — Other Ambulatory Visit: Payer: Self-pay | Admitting: Family Medicine

## 2024-09-01 DIAGNOSIS — M5459 Other low back pain: Secondary | ICD-10-CM

## 2024-09-01 DIAGNOSIS — M6281 Muscle weakness (generalized): Secondary | ICD-10-CM

## 2024-09-01 MED ORDER — CYCLOBENZAPRINE HCL 10 MG PO TABS: 10.0000 mg | ORAL_TABLET | Freq: Three times a day (TID) | ORAL | 1 refills | Status: AC | PRN

## 2024-09-01 NOTE — Therapy (Signed)
 " OUTPATIENT PHYSICAL THERAPY TREATMENT NOTE   Patient Name: Felicia Perkins MRN: [id number] DOB:08-May-1976, 49 y.o., female Today's Date: 09/01/2024  END OF SESSION:  PT End of Session - 09/01/24 1212     Visit Number 2    Number of Visits 12    Date for Recertification  10/24/24    Authorization Type [employer]    Authorization Time Period Auth pending    PT Start Time 1215    PT Stop Time 1253    PT Time Calculation (min) 38 min    Activity Tolerance Patient tolerated treatment well    Behavior During Therapy [facility] for tasks assessed/performed         Past Medical History:  Diagnosis Date   Acute on chronic pancreatitis (HCC) 03/22/2019   Acute pancreatitis    Alcohol  abuse    Alcohol  dependence (HCC) 12/16/2012   Medical detox successful    Alcohol  induced acute pancreatitis 11/22/2017   Alcoholic cirrhosis of liver without ascites (HCC) 10/15/2016   Anemia    Anxiety    Aortic atherosclerosis    Bipolar 1 disorder (HCC)    Cancer (HCC)    cervical cancer   Chest pain with moderate risk for cardiac etiology 12/15/2014   Cirrhosis (HCC)    COPD (chronic obstructive pulmonary disease) (HCC) 09/04/2016   Coronary artery disease    Depression    Duodenitis 11/22/2017   Eczema 10/09/2018   Elevated liver enzymes 12/22/2014   Fracture of femoral neck, left (HCC) 06/04/2022   Gastritis and gastroduodenitis 09/04/2016   GERD (gastroesophageal reflux disease)    Hip fracture (HCC) 06/03/2022   Nondisplaced fracture of neck of fifth metacarpal bone, right hand, initial encounter for closed fracture 08/26/2017   Pancreatitis    Peripheral vascular disease    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   PTSD (post-traumatic stress disorder)    Tobacco abuse 09/04/2016   Tobacco abuse counseling    Vascular disease    Past Surgical History:  Procedure Laterality Date   ABDOMINAL HYSTERECTOMY  2004   BIOPSY  01/02/2018   Procedure: BIOPSY;  Surgeon: Teressa Toribio SQUIBB, MD;   Location: [facility];  Service: Endoscopy;;   COLONOSCOPY     Done in [place] she thinks. Early 20's. Normal   ENDARTERECTOMY Right 01/26/2021   Procedure: RIGHT CAROTID ARTERY ENDARTERECTOMY;  Surgeon: Sheree Penne Bruckner, MD;  Location: [facility] OR;  Service: Vascular;  Laterality: Right;   ESOPHAGOGASTRODUODENOSCOPY (EGD) WITH PROPOFOL  N/A 01/02/2018   Procedure: ESOPHAGOGASTRODUODENOSCOPY (EGD) WITH PROPOFOL ;  Surgeon: Teressa Toribio SQUIBB, MD;  Location: [facility];  Service: Endoscopy;  Laterality: N/A;   EUS N/A 01/02/2018   Procedure: UPPER ENDOSCOPIC ULTRASOUND (EUS) RADIAL;  Surgeon: Teressa Toribio SQUIBB, MD;  Location: [facility];  Service: Endoscopy;  Laterality: N/A;   FOOT SURGERY     HIP PINNING,CANNULATED Left 06/05/2022   Procedure: PERCUTANEOUS FIXATION OF FEMORAL NECK;  Surgeon: Ernie Cough, MD;  Location: [facility] OR;  Service: Orthopedics;  Laterality: Left;   OTHER SURGICAL HISTORY     ovarian surgery   TOTAL HIP ARTHROPLASTY Left 04/18/2023   Procedure: TOTAL HIP ARTHROPLASTY ANTERIOR APPROACH;  Surgeon: Ernie Cough, MD;  Location: [facility];  Service: Orthopedics;  Laterality: Left;   TUBAL LIGATION     Patient Active Problem List   Diagnosis Date Noted   S/P total left hip arthroplasty 04/18/2023   Protein-calorie malnutrition, severe 03/13/2023   Lactic acidosis 03/13/2023   Alcoholic pancreatitis 03/11/2023   Acute on chronic pancreatitis (HCC)  03/10/2023   Intractable nausea and vomiting 10/30/2022   Malnutrition of moderate degree 06/05/2022   Essential (primary) hypertension 03/16/2021   Smoker 03/16/2021   History of carotid endarterectomy 03/16/2021   Coronary artery disease 03/02/2021   Depression 03/02/2021   Carotid artery stenosis 01/26/2021   Anxiety    Cancer (HCC)    Cirrhosis (HCC)    GERD (gastroesophageal reflux disease)    PTSD (post-traumatic stress disorder)    Vascular disease    Bipolar 1 disorder (HCC) 01/13/2019   Eczema 10/09/2018   Acute  pancreatitis    Tobacco abuse counseling    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   Alcoholic cirrhosis of liver without ascites (HCC) 10/15/2016   Alcohol  abuse 09/04/2016   Tobacco abuse 09/04/2016   COPD (chronic obstructive pulmonary disease) (HCC) 09/04/2016   Alcohol  dependence (HCC) 12/16/2012    Class: Acute    PCP: Delbert Clam, MD  REFERRING PROVIDER: Delbert Clam, MD  REFERRING DIAG: M54.50,G89.29 (ICD-10-CM) - Chronic low back pain, unspecified back pain laterality, unspecified whether sciatica present  Rationale for Evaluation and Treatment: Rehabilitation  THERAPY DIAG:  Other low back pain  Muscle weakness (generalized)  ONSET DATE: chronic  SUBJECTIVE:                                                                                                                                                                                           SUBJECTIVE STATEMENT: Patient reports that her back pain continues to bother her, she recently took a trip and her pain increased from sitting for an extended period of time.  EVAL: Reports low back pain ongoing for a year.  Symptoms began following L THA.  Reports symptoms onset after prolonged standing.  Denies radicular symptoms.  Difficulty describing distinct aggravating or relieving factors other than medication for relief.  PERTINENT HISTORY:  None available regarding lumbar  PAIN:  Are you having pain? Yes: NPRS scale: 8/10 Pain location: low back Pain description: ache Aggravating factors: prolonged standing Relieving factors: meds,   PRECAUTIONS: None  RED FLAGS: None   WEIGHT BEARING RESTRICTIONS: No  FALLS:  Has patient fallen in last 6 months? No  OCCUPATION: [employer]  PLOF: Independent  PATIENT GOALS: To manage my back pain  NEXT MD VISIT: 3/26  OBJECTIVE:  Note: Objective measures were completed at Evaluation unless otherwise noted.  DIAGNOSTIC FINDINGS:  none  PATIENT SURVEYS:  Modified  Oswestry: 12/50  Interpretation of scores: Score Category Description  0-20% Minimal Disability The patient can cope with most living activities. Usually no treatment is indicated apart from advice on lifting, sitting and exercise  21-40% Moderate Disability The  patient experiences more pain and difficulty with sitting, lifting and standing. Travel and social life are more difficult and they may be disabled from work. Personal care, sexual activity and sleeping are not grossly affected, and the patient can usually be managed by conservative means  41-60% Severe Disability Pain remains the main problem in this group, but activities of daily living are affected. These patients require a detailed investigation  61-80% Crippled Back pain impinges on all aspects of the patients life. Positive intervention is required  81-100% Bed-bound These patients are either bed-bound or exaggerating their symptoms  Bluford FORBES Zoe DELENA Karon DELENA, et al. Surgery versus conservative management of stable thoracolumbar fracture: the PRESTO feasibility RCT. [place] ([place]): [employer]; 2021 Nov. [facility] Technology Assessment, No. 25.62.) Appendix 3, Oswestry Disability Index category descriptors. Available from: Findjewelers.cz  Minimally Clinically Important Difference (MCID) = 12.8%  MUSCLE LENGTH: Hamstrings: Right 80 deg; Left 80 deg Thomas test: PKB mild restrictions L>R  POSTURE: No Significant postural limitations  PALPATION: unremarable  LUMBAR ROM:   AROM eval  Flexion 90%  Extension 25%p!  Right lateral flexion 90%  Left lateral flexion 90%  Right rotation 50%  Left rotation 50%   (Blank rows = not tested)  LOWER EXTREMITY ROM:   WNL  Active  Right eval Left eval  Hip flexion    Hip extension    Hip abduction    Hip adduction    Hip internal rotation    Hip external rotation    Knee flexion    Knee extension    Ankle dorsiflexion    Ankle  plantarflexion    Ankle inversion    Ankle eversion     (Blank rows = not tested)  LOWER EXTREMITY MMT:  see 30s chair stand test  MMT Right eval Left eval  Hip flexion    Hip extension    Hip abduction    Hip adduction    Hip internal rotation    Hip external rotation    Knee flexion    Knee extension    Ankle dorsiflexion    Ankle plantarflexion    Ankle inversion    Ankle eversion     (Blank rows = not tested)  LUMBAR SPECIAL TESTS:  Straight leg raise test: Negative and Slump test: Negative  FUNCTIONAL TESTS:  30 seconds chair stand test  GAIT: Distance walked: 75ft x2 Assistive device utilized: None Level of assistance: Complete Independence Comments: WFL  TREATMENT:             [facility] Adult PT Treatment:                                                DATE: 09/01/24 Therapeutic Exercise: Nustep level 5 x 5 mins Seated thoracic ext over 1/2 foam roll in chair 2x10 Supine bridges 2x10 Supine LTR x10 (difficult, rotation painful) Seated pball roll outs fwd x8 (pain in neck) Neuromuscular re-ed: Supine PPT 3 hold x10 initial cues for form Therapeutic Activity: STS 2x5 arms crossed Self Care: Discussion of purpose of PT, strengthening for muscles proximal and distal to lower back to improve functional independence and decrease pain over time  [facility] Adult PT Treatment:                                                DATE: 08/26/24 Eval and HEP Self Care: Additional minutes spent for educating on updated Therapeutic Home Exercise Program as well as comparing current status to condition at start of symptoms. This included exercises focusing on stretching, strengthening, with focus on eccentric aspects. Long term goals include an improvement in range of motion, strength, endurance as well as avoiding reinjury. Patient's frequency would include in 1-2 times a day,  3-5 times a week for a duration of 6-12 weeks. Proper technique shown and discussed handout in great detail. All questions were discussed and addressed.     PATIENT EDUCATION:  Education details: Discussed eval findings, rehab rationale and POC and patient is in agreement  Person educated: Patient Education method: Explanation and Handouts Education comprehension: verbalized understanding and needs further education  HOME EXERCISE PROGRAM: Access Code: [email or web address] URL: [email or web address] Date: 08/26/2024 Prepared by: Reyes Kohut  Exercises - Supine bridge with ball  - 2 x daily - 3-5 x weekly - 1-2 sets - 15 reps - Modified Thomas Stretch  - 2 x daily - 3-5 x weekly - 1 sets - 2 reps - 30s hold - Sit to Stand with Arms Crossed  - 2 x daily - 3-5 x weekly - 2 sets - 5 reps  ASSESSMENT:  CLINICAL IMPRESSION: Patient presents to first follow up PT session reporting continued back pain and that she has been compliant with her HEP, even though it is difficult. Session today focused on gentle core strengthening and principals of PT and building therapeutic alliance. Patient was able to tolerate all prescribed exercises with no adverse effects. Patient continues to benefit from skilled PT services and should be progressed as able to improve functional independence.   EVAL: Patient is a 50 y.o. female who was seen today for physical therapy evaluation and treatment for chronic low back pain.  Patient presents with ROM restriction in lumbar region, primarily extenison and rotation, neural tension signs negative, BLE weakness evident in 30s chair stand test and soft tissue restrictions in L hip flexors most likely contributing to low back symptoms.  Patient is a good rehab candidate with treatment focused on increasing core and LE strength and regaining trunk mobility.  OBJECTIVE IMPAIRMENTS: decreased activity tolerance, decreased knowledge of condition, decreased mobility,  decreased ROM, decreased strength, increased fascial restrictions, impaired perceived functional ability, improper body mechanics, and pain.   ACTIVITY LIMITATIONS: carrying, lifting, standing, and sleeping  PERSONAL FACTORS: Age, Behavior pattern, Fitness, Past/current experiences, and Time since onset of injury/illness/exacerbation are also affecting patient's functional outcome.   REHAB POTENTIAL: Good  CLINICAL DECISION MAKING: Stable/uncomplicated  EVALUATION COMPLEXITY: Moderate   GOALS: Goals reviewed with patient? No  SHORT TERM GOALS: Target date: 09/23/2024    Patient to demonstrate independence in HEP  Baseline: [id number] Goal status: INITIAL  2.  Resolve PKB limitations Baseline: mild restrictions L>R Goal status: INITIAL   LONG TERM GOALS: Target date: 10/21/2024    Patient will acknowledge 6/10 pain at least once during episode of care   Baseline: 8/10 Goal status: INITIAL  2.  Patient will score at least 8/50 on ODI to signify clinically meaningful improvement in functional abilities.   Baseline: 12/50 Goal status: INITIAL  3.  Patient  will increase 30s chair stand reps from 5 to 8 with/without arms to demonstrate and improved functional ability with less pain/difficulty as well as reduce fall risk.  Baseline: 5 Goal status: INITIAL  4.  Increase trunk ROM to 75% Baseline:  AROM eval  Flexion 90%  Extension 25%p!  Right lateral flexion 90%  Left lateral flexion 90%  Right rotation 50%  Left rotation 50%   Goal status: INITIAL  PLAN:  PT FREQUENCY: 1-2x/week  PT DURATION: 6 weeks  PLANNED INTERVENTIONS: 97110-Therapeutic exercises, 97530- Therapeutic activity, [id number]- Neuromuscular re-education, 97535- Self Care, [id number]- Manual therapy, and Patient/Family education.  PLAN FOR NEXT SESSION: HEP review and update, manual techniques as appropriate, aerobic tasks, ROM and flexibility activities, strengthening and PREs, TPDN, gait and balance  training,aquatic therapy, modalities for pain and NMRE     For all possible CPT codes, reference the Planned Interventions line above.     Check all conditions that are expected to impact treatment: {Conditions expected to impact treatment:None of these apply   If treatment provided at initial evaluation, no treatment charged due to lack of authorization.       Corean Pouch, PTA 09/01/2024, 1:08 PM   "

## 2024-09-02 ENCOUNTER — Ambulatory Visit: Payer: MEDICAID

## 2024-09-04 ENCOUNTER — Ambulatory Visit: Payer: MEDICAID

## 2024-09-08 ENCOUNTER — Ambulatory Visit: Payer: MEDICAID

## 2024-09-08 DIAGNOSIS — M6281 Muscle weakness (generalized): Secondary | ICD-10-CM

## 2024-09-08 DIAGNOSIS — M5459 Other low back pain: Secondary | ICD-10-CM | POA: Diagnosis not present

## 2024-09-08 NOTE — Therapy (Signed)
 " OUTPATIENT PHYSICAL THERAPY TREATMENT NOTE   Patient Name: Felicia Perkins MRN: [id number] DOB:[birth year], 49 y.o., female Today's Date: 09/08/2024  END OF SESSION:  PT End of Session - 09/08/24 1443     Visit Number 3    Number of Visits 12    Date for Recertification  10/24/24    Authorization Type [employer]    Authorization Time Period Auth pending    PT Start Time 1445    PT Stop Time 1523    PT Time Calculation (min) 38 min    Activity Tolerance Patient tolerated treatment well    Behavior During Therapy [facility] for tasks assessed/performed          Past Medical History:  Diagnosis Date   Acute on chronic pancreatitis (HCC) 03/22/2019   Acute pancreatitis    Alcohol  abuse    Alcohol  dependence (HCC) 12/16/2012   Medical detox successful    Alcohol  induced acute pancreatitis 11/22/2017   Alcoholic cirrhosis of liver without ascites (HCC) 10/15/2016   Anemia    Anxiety    Aortic atherosclerosis    Bipolar 1 disorder (HCC)    Cancer (HCC)    cervical cancer   Chest pain with moderate risk for cardiac etiology 12/15/2014   Cirrhosis (HCC)    COPD (chronic obstructive pulmonary disease) (HCC) 09/04/2016   Coronary artery disease    Depression    Duodenitis 11/22/2017   Eczema 10/09/2018   Elevated liver enzymes 12/22/2014   Fracture of femoral neck, left (HCC) 06/04/2022   Gastritis and gastroduodenitis 09/04/2016   GERD (gastroesophageal reflux disease)    Hip fracture (HCC) 06/03/2022   Nondisplaced fracture of neck of fifth metacarpal bone, right hand, initial encounter for closed fracture 08/26/2017   Pancreatitis    Peripheral vascular disease    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   PTSD (post-traumatic stress disorder)    Tobacco abuse 09/04/2016   Tobacco abuse counseling    Vascular disease    Past Surgical History:  Procedure Laterality Date   ABDOMINAL HYSTERECTOMY  2004   BIOPSY  01/02/2018   Procedure: BIOPSY;  Surgeon: Teressa Toribio SQUIBB, MD;   Location: [facility];  Service: Endoscopy;;   COLONOSCOPY     Done in [place] she thinks. Early 20's. Normal   ENDARTERECTOMY Right 01/26/2021   Procedure: RIGHT CAROTID ARTERY ENDARTERECTOMY;  Surgeon: Sheree Penne Bruckner, MD;  Location: [facility] OR;  Service: Vascular;  Laterality: Right;   ESOPHAGOGASTRODUODENOSCOPY (EGD) WITH PROPOFOL  N/A 01/02/2018   Procedure: ESOPHAGOGASTRODUODENOSCOPY (EGD) WITH PROPOFOL ;  Surgeon: Teressa Toribio SQUIBB, MD;  Location: [facility];  Service: Endoscopy;  Laterality: N/A;   EUS N/A 01/02/2018   Procedure: UPPER ENDOSCOPIC ULTRASOUND (EUS) RADIAL;  Surgeon: Teressa Toribio SQUIBB, MD;  Location: [facility];  Service: Endoscopy;  Laterality: N/A;   FOOT SURGERY     HIP PINNING,CANNULATED Left 06/05/2022   Procedure: PERCUTANEOUS FIXATION OF FEMORAL NECK;  Surgeon: Ernie Cough, MD;  Location: [facility];  Service: Orthopedics;  Laterality: Left;   OTHER SURGICAL HISTORY     ovarian surgery   TOTAL HIP ARTHROPLASTY Left 04/18/2023   Procedure: TOTAL HIP ARTHROPLASTY ANTERIOR APPROACH;  Surgeon: Ernie Cough, MD;  Location: [facility] ORS;  Service: Orthopedics;  Laterality: Left;   TUBAL LIGATION     Patient Active Problem List   Diagnosis Date Noted   S/P total left hip arthroplasty 04/18/2023   Protein-calorie malnutrition, severe 03/13/2023   Lactic acidosis 03/13/2023   Alcoholic pancreatitis 03/11/2023   Acute on chronic pancreatitis (  HCC) 03/10/2023   Intractable nausea and vomiting 10/30/2022   Malnutrition of moderate degree 06/05/2022   Essential (primary) hypertension 03/16/2021   Smoker 03/16/2021   History of carotid endarterectomy 03/16/2021   Coronary artery disease 03/02/2021   Depression 03/02/2021   Carotid artery stenosis 01/26/2021   Anxiety    Cancer (HCC)    Cirrhosis (HCC)    GERD (gastroesophageal reflux disease)    PTSD (post-traumatic stress disorder)    Vascular disease    Bipolar 1 disorder (HCC) 01/13/2019   Eczema 10/09/2018   Acute  pancreatitis    Tobacco abuse counseling    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   Alcoholic cirrhosis of liver without ascites (HCC) 10/15/2016   Alcohol  abuse 09/04/2016   Tobacco abuse 09/04/2016   COPD (chronic obstructive pulmonary disease) (HCC) 09/04/2016   Alcohol  dependence (HCC) 12/16/2012    Class: Acute    PCP: Delbert Clam, MD  REFERRING PROVIDER: Delbert Clam, MD  REFERRING DIAG: M54.50,G89.29 (ICD-10-CM) - Chronic low back pain, unspecified back pain laterality, unspecified whether sciatica present  Rationale for Evaluation and Treatment: Rehabilitation  THERAPY DIAG:  Other low back pain  Muscle weakness (generalized)  ONSET DATE: chronic  SUBJECTIVE:                                                                                                                                                                                           SUBJECTIVE STATEMENT: Patient reports no pain upon arrival. She is back on the muscle relaxors which helps with her pain.   EVAL: Reports low back pain ongoing for a year.  Symptoms began following L THA.  Reports symptoms onset after prolonged standing.  Denies radicular symptoms.  Difficulty describing distinct aggravating or relieving factors other than medication for relief.  PERTINENT HISTORY:  None available regarding lumbar  PAIN:  Are you having pain? Yes: NPRS scale: 8/10 Pain location: low back Pain description: ache Aggravating factors: prolonged standing Relieving factors: meds,   PRECAUTIONS: None  RED FLAGS: None   WEIGHT BEARING RESTRICTIONS: No  FALLS:  Has patient fallen in last 6 months? No  OCCUPATION: [employer]  PLOF: Independent  PATIENT GOALS: To manage my back pain  NEXT MD VISIT: 3/26  OBJECTIVE:  Note: Objective measures were completed at Evaluation unless otherwise noted.  DIAGNOSTIC FINDINGS:  none  PATIENT SURVEYS:  Modified Oswestry: 12/50  Interpretation of scores: Score  Category Description  0-20% Minimal Disability The patient can cope with most living activities. Usually no treatment is indicated apart from advice on lifting, sitting and exercise  21-40% Moderate Disability The patient experiences more pain and difficulty with  sitting, lifting and standing. Travel and social life are more difficult and they may be disabled from work. Personal care, sexual activity and sleeping are not grossly affected, and the patient can usually be managed by conservative means  41-60% Severe Disability Pain remains the main problem in this group, but activities of daily living are affected. These patients require a detailed investigation  61-80% Crippled Back pain impinges on all aspects of the patients life. Positive intervention is required  81-100% Bed-bound These patients are either bed-bound or exaggerating their symptoms  Bluford FORBES Felicia Perkins Felicia Perkins, et al. Surgery versus conservative management of stable thoracolumbar fracture: the PRESTO feasibility RCT. [place] ([place]): [employer]; 2021 Nov. [facility] [employer] Assessment, No. 25.62.) Appendix 3, Oswestry Disability Index category descriptors. Available from: Findjewelers.cz  Minimally Clinically Important Difference (MCID) = 12.8%  MUSCLE LENGTH: Hamstrings: Right 80 deg; Left 80 deg Thomas test: PKB mild restrictions L>R  POSTURE: No Significant postural limitations  PALPATION: unremarable  LUMBAR ROM:   AROM eval  Flexion 90%  Extension 25%p!  Right lateral flexion 90%  Left lateral flexion 90%  Right rotation 50%  Left rotation 50%   (Blank rows = not tested)  LOWER EXTREMITY ROM:   WNL  Active  Right eval Left eval  Hip flexion    Hip extension    Hip abduction    Hip adduction    Hip internal rotation    Hip external rotation    Knee flexion    Knee extension    Ankle dorsiflexion    Ankle plantarflexion    Ankle inversion    Ankle eversion      (Blank rows = not tested)  LOWER EXTREMITY MMT:  see 30s chair stand test  MMT Right eval Left eval  Hip flexion    Hip extension    Hip abduction    Hip adduction    Hip internal rotation    Hip external rotation    Knee flexion    Knee extension    Ankle dorsiflexion    Ankle plantarflexion    Ankle inversion    Ankle eversion     (Blank rows = not tested)  LUMBAR SPECIAL TESTS:  Straight leg raise test: Negative and Slump test: Negative  FUNCTIONAL TESTS:  30 seconds chair stand test  GAIT: Distance walked: 48ft x2 Assistive device utilized: None Level of assistance: Complete Independence Comments: WFL  TREATMENT:             [facility] Adult PT Treatment:                                                DATE: 09/08/24 Therapeutic Exercise: Nustep level 5 x 5 mins Supine DKTC with ball x 10  Supine bridges  on ball x 10 Supine LTR with ball x 10 Seated pball roll outs fwd x8 (pain in neck) Neuromuscular re-ed: Supine PPT 3 hold x10  Supine hip hike x 10 B  Clam shell YTB 2x10 B  Standing hip ext x 10 B  TRX squat with active core brace x 8  [facility] Adult PT Treatment:                                                DATE: 08/26/24 Eval and HEP Self Care: Additional minutes spent for educating on updated Therapeutic Home Exercise Program as well as comparing current status to condition at start of symptoms. This included exercises focusing on stretching, strengthening, with focus on eccentric aspects. Long term goals include an improvement in range of motion, strength, endurance as well as avoiding reinjury. Patient's frequency would include in 1-2 times a day, 3-5 times a week for a duration of 6-12 weeks. Proper technique shown and discussed handout in great detail. All questions were discussed and addressed.     PATIENT EDUCATION:  Education details:  Discussed eval findings, rehab rationale and POC and patient is in agreement  Person educated: Patient Education method: Explanation and Handouts Education comprehension: verbalized understanding and needs further education  HOME EXERCISE PROGRAM: Access Code: [email or web address] URL: [email or web address] Date: 08/26/2024 Prepared by: Reyes Kohut  Exercises - Supine bridge with ball  - 2 x daily - 3-5 x weekly - 1-2 sets - 15 reps - Modified Thomas Stretch  - 2 x daily - 3-5 x weekly - 1 sets - 2 reps - 30s hold - Sit to Stand with Arms Crossed  - 2 x daily - 3-5 x weekly - 2 sets - 5 reps  ASSESSMENT:  CLINICAL IMPRESSION: Pt guided through hip strengthening, lumbar/pelvic mobility, and core stability exercises. Pt noting no pain, fatigue only. She has poor hip ext strength in prone and standing.  Patient continues to benefit from skilled PT services and should be progressed as able to improve functional independence.   EVAL: Patient is a 49 y.o. female who was seen today for physical therapy evaluation and treatment for chronic low back pain.  Patient presents with ROM restriction in lumbar region, primarily extenison and rotation, neural tension signs negative, BLE weakness evident in 30s chair stand test and soft tissue restrictions in L hip flexors most likely contributing to low back symptoms.  Patient is a good rehab candidate with treatment focused on increasing core and LE strength and regaining trunk mobility.  OBJECTIVE IMPAIRMENTS: decreased activity tolerance, decreased knowledge of condition, decreased mobility, decreased ROM, decreased strength, increased fascial restrictions, impaired perceived functional ability, improper body mechanics, and pain.   ACTIVITY LIMITATIONS: carrying, lifting, standing, and sleeping  PERSONAL FACTORS: Age, Behavior pattern, Fitness, Past/current experiences, and Time since onset of injury/illness/exacerbation are also affecting  patient's functional outcome.   REHAB POTENTIAL: Good  CLINICAL DECISION MAKING: Stable/uncomplicated  EVALUATION COMPLEXITY: Moderate   GOALS: Goals reviewed with patient? No  SHORT TERM GOALS: Target date: 09/23/2024    Patient to demonstrate independence in HEP  Baseline: [id number] Goal status: INITIAL  2.  Resolve PKB limitations Baseline: mild restrictions L>R Goal status: INITIAL   LONG TERM GOALS: Target date: 10/21/2024    Patient will acknowledge 6/10 pain at least once during episode of care   Baseline: 8/10 Goal status: INITIAL  2.  Patient will score at least 8/50 on ODI to signify clinically meaningful improvement in functional abilities.   Baseline: 12/50 Goal status: INITIAL  3.  Patient will increase 30s chair stand reps from 5 to 8 with/without arms to demonstrate and improved functional ability with less pain/difficulty as well as reduce  fall risk.  Baseline: 5 Goal status: INITIAL  4.  Increase trunk ROM to 75% Baseline:  AROM eval  Flexion 90%  Extension 25%p!  Right lateral flexion 90%  Left lateral flexion 90%  Right rotation 50%  Left rotation 50%   Goal status: INITIAL  PLAN:  PT FREQUENCY: 1-2x/week  PT DURATION: 6 weeks  PLANNED INTERVENTIONS: 97110-Therapeutic exercises, 97530- Therapeutic activity, [id number]- Neuromuscular re-education, 97535- Self Care, [id number]- Manual therapy, and Patient/Family education.  PLAN FOR NEXT SESSION: HEP review and update, manual techniques as appropriate, aerobic tasks, ROM and flexibility activities, strengthening and PREs, TPDN, gait and balance training,aquatic therapy, modalities for pain and NMRE     For all possible CPT codes, reference the Planned Interventions line above.     Check all conditions that are expected to impact treatment: {Conditions expected to impact treatment:None of these apply   If treatment provided at initial evaluation, no treatment charged due to lack of authorization.        Marijo Felicia Perkins, PT 09/08/2024, 3:24 PM   "

## 2024-09-09 ENCOUNTER — Other Ambulatory Visit: Payer: Self-pay | Admitting: Family Medicine

## 2024-09-09 MED ORDER — CLOPIDOGREL BISULFATE 75 MG PO TABS: 75.0000 mg | ORAL_TABLET | Freq: Every day | ORAL | 1 refills | Status: AC

## 2024-09-10 ENCOUNTER — Ambulatory Visit: Payer: MEDICAID

## 2024-09-10 DIAGNOSIS — M6281 Muscle weakness (generalized): Secondary | ICD-10-CM

## 2024-09-10 DIAGNOSIS — M5459 Other low back pain: Secondary | ICD-10-CM

## 2024-09-10 NOTE — Therapy (Signed)
 " OUTPATIENT PHYSICAL THERAPY TREATMENT NOTE   Patient Name: Felicia Perkins MRN: [id number] DOB:1976-03-10, 49 y.o., female Today's Date: 09/10/2024  END OF SESSION:  PT End of Session - 09/10/24 0839     Visit Number 4    Number of Visits 12    Date for Recertification  10/24/24    Authorization Type [employer]    Authorization Time Period Approved 12 PT visits from 08/26/24-10/24/24    Authorization - Number of Visits 12    PT Start Time 0835    PT Stop Time 0913    PT Time Calculation (min) 38 min    Activity Tolerance Patient tolerated treatment well    Behavior During Therapy [facility] for tasks assessed/performed           Past Medical History:  Diagnosis Date   Acute on chronic pancreatitis (HCC) 03/22/2019   Acute pancreatitis    Alcohol  abuse    Alcohol  dependence (HCC) 12/16/2012   Medical detox successful    Alcohol  induced acute pancreatitis 11/22/2017   Alcoholic cirrhosis of liver without ascites (HCC) 10/15/2016   Anemia    Anxiety    Aortic atherosclerosis    Bipolar 1 disorder (HCC)    Cancer (HCC)    cervical cancer   Chest pain with moderate risk for cardiac etiology 12/15/2014   Cirrhosis (HCC)    COPD (chronic obstructive pulmonary disease) (HCC) 09/04/2016   Coronary artery disease    Depression    Duodenitis 11/22/2017   Eczema 10/09/2018   Elevated liver enzymes 12/22/2014   Fracture of femoral neck, left (HCC) 06/04/2022   Gastritis and gastroduodenitis 09/04/2016   GERD (gastroesophageal reflux disease)    Hip fracture (HCC) 06/03/2022   Nondisplaced fracture of neck of fifth metacarpal bone, right hand, initial encounter for closed fracture 08/26/2017   Pancreatitis    Peripheral vascular disease    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   PTSD (post-traumatic stress disorder)    Tobacco abuse 09/04/2016   Tobacco abuse counseling    Vascular disease    Past Surgical History:  Procedure Laterality Date   ABDOMINAL HYSTERECTOMY  2004   BIOPSY   01/02/2018   Procedure: BIOPSY;  Surgeon: Teressa Toribio SQUIBB, MD;  Location: [facility];  Service: Endoscopy;;   COLONOSCOPY     Done in [place] she thinks. Early 20's. Normal   ENDARTERECTOMY Right 01/26/2021   Procedure: RIGHT CAROTID ARTERY ENDARTERECTOMY;  Surgeon: Sheree Penne Bruckner, MD;  Location: [facility] OR;  Service: Vascular;  Laterality: Right;   ESOPHAGOGASTRODUODENOSCOPY (EGD) WITH PROPOFOL  N/A 01/02/2018   Procedure: ESOPHAGOGASTRODUODENOSCOPY (EGD) WITH PROPOFOL ;  Surgeon: Teressa Toribio SQUIBB, MD;  Location: [facility];  Service: Endoscopy;  Laterality: N/A;   EUS N/A 01/02/2018   Procedure: UPPER ENDOSCOPIC ULTRASOUND (EUS) RADIAL;  Surgeon: Teressa Toribio SQUIBB, MD;  Location: [facility];  Service: Endoscopy;  Laterality: N/A;   FOOT SURGERY     HIP PINNING,CANNULATED Left 06/05/2022   Procedure: PERCUTANEOUS FIXATION OF FEMORAL NECK;  Surgeon: Ernie Cough, MD;  Location: [facility] OR;  Service: Orthopedics;  Laterality: Left;   OTHER SURGICAL HISTORY     ovarian surgery   TOTAL HIP ARTHROPLASTY Left 04/18/2023   Procedure: TOTAL HIP ARTHROPLASTY ANTERIOR APPROACH;  Surgeon: Ernie Cough, MD;  Location: [facility];  Service: Orthopedics;  Laterality: Left;   TUBAL LIGATION     Patient Active Problem List   Diagnosis Date Noted   S/P total left hip arthroplasty 04/18/2023   Protein-calorie malnutrition, severe 03/13/2023  Lactic acidosis 03/13/2023   Alcoholic pancreatitis 03/11/2023   Acute on chronic pancreatitis (HCC) 03/10/2023   Intractable nausea and vomiting 10/30/2022   Malnutrition of moderate degree 06/05/2022   Essential (primary) hypertension 03/16/2021   Smoker 03/16/2021   History of carotid endarterectomy 03/16/2021   Coronary artery disease 03/02/2021   Depression 03/02/2021   Carotid artery stenosis 01/26/2021   Anxiety    Cancer (HCC)    Cirrhosis (HCC)    GERD (gastroesophageal reflux disease)    PTSD (post-traumatic stress disorder)    Vascular disease     Bipolar 1 disorder (HCC) 01/13/2019   Eczema 10/09/2018   Acute pancreatitis    Tobacco abuse counseling    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   Alcoholic cirrhosis of liver without ascites (HCC) 10/15/2016   Alcohol  abuse 09/04/2016   Tobacco abuse 09/04/2016   COPD (chronic obstructive pulmonary disease) (HCC) 09/04/2016   Alcohol  dependence (HCC) 12/16/2012    Class: Acute    PCP: Delbert Clam, MD  REFERRING PROVIDER: Delbert Clam, MD  REFERRING DIAG: M54.50,G89.29 (ICD-10-CM) - Chronic low back pain, unspecified back pain laterality, unspecified whether sciatica present  Rationale for Evaluation and Treatment: Rehabilitation  THERAPY DIAG:  Other low back pain  Muscle weakness (generalized)  ONSET DATE: chronic  SUBJECTIVE:                                                                                                                                                                                           SUBJECTIVE STATEMENT: Patient reports soreness after last session. Notes no pain upon arrival.   EVAL: Reports low back pain ongoing for a year.  Symptoms began following L THA.  Reports symptoms onset after prolonged standing.  Denies radicular symptoms.  Difficulty describing distinct aggravating or relieving factors other than medication for relief.  PERTINENT HISTORY:  None available regarding lumbar  PAIN:  Are you having pain? Yes: NPRS scale: 8/10 Pain location: low back Pain description: ache Aggravating factors: prolonged standing Relieving factors: meds,   PRECAUTIONS: None  RED FLAGS: None   WEIGHT BEARING RESTRICTIONS: No  FALLS:  Has patient fallen in last 6 months? No  OCCUPATION: [employer]  PLOF: Independent  PATIENT GOALS: To manage my back pain  NEXT MD VISIT: 3/26  OBJECTIVE:  Note: Objective measures were completed at Evaluation unless otherwise noted.  DIAGNOSTIC FINDINGS:  none  PATIENT SURVEYS:  Modified Oswestry:  12/50  Interpretation of scores: Score Category Description  0-20% Minimal Disability The patient can cope with most living activities. Usually no treatment is indicated apart from advice on lifting, sitting and exercise  21-40% Moderate Disability The  patient experiences more pain and difficulty with sitting, lifting and standing. Travel and social life are more difficult and they may be disabled from work. Personal care, sexual activity and sleeping are not grossly affected, and the patient can usually be managed by conservative means  41-60% Severe Disability Pain remains the main problem in this group, but activities of daily living are affected. These patients require a detailed investigation  61-80% Crippled Back pain impinges on all aspects of the patients life. Positive intervention is required  81-100% Bed-bound These patients are either bed-bound or exaggerating their symptoms  Bluford FORBES Zoe DELENA Karon DELENA, et al. Surgery versus conservative management of stable thoracolumbar fracture: the PRESTO feasibility RCT. [place] ([place]): [employer]; 2021 Nov. [facility] Technology Assessment, No. 25.62.) Appendix 3, Oswestry Disability Index category descriptors. Available from: Findjewelers.cz  Minimally Clinically Important Difference (MCID) = 12.8%  MUSCLE LENGTH: Hamstrings: Right 80 deg; Left 80 deg Thomas test: PKB mild restrictions L>R  POSTURE: No Significant postural limitations  PALPATION: unremarable  LUMBAR ROM:   AROM eval  Flexion 90%  Extension 25%p!  Right lateral flexion 90%  Left lateral flexion 90%  Right rotation 50%  Left rotation 50%   (Blank rows = not tested)  LOWER EXTREMITY ROM:   WNL  Active  Right eval Left eval  Hip flexion    Hip extension    Hip abduction    Hip adduction    Hip internal rotation    Hip external rotation    Knee flexion    Knee extension    Ankle dorsiflexion    Ankle  plantarflexion    Ankle inversion    Ankle eversion     (Blank rows = not tested)  LOWER EXTREMITY MMT:  see 30s chair stand test  MMT Right eval Left eval  Hip flexion    Hip extension    Hip abduction    Hip adduction    Hip internal rotation    Hip external rotation    Knee flexion    Knee extension    Ankle dorsiflexion    Ankle plantarflexion    Ankle inversion    Ankle eversion     (Blank rows = not tested)  LUMBAR SPECIAL TESTS:  Straight leg raise test: Negative and Slump test: Negative  FUNCTIONAL TESTS:  30 seconds chair stand test  GAIT: Distance walked: 84ft x2 Assistive device utilized: None Level of assistance: Complete Independence Comments: WFL  TREATMENT:             [facility] Adult PT Treatment:                                                DATE: 09/10/24 Therapeutic Exercise: Nustep level 4 x 6 mins Ball roll out all directions 10x3 hold  Open books x 10 B  Neuromuscular re-ed: Supine PPT 3 hold x 10  Gluteal set 3 hold x 10  Bridge with core brace x 10  Staggered stance bridge x 10 B  Clam shell YTB 2x10 B  Standing hip ext x 10 B  TRX squat with active core brace x 8  [facility] Adult PT Treatment:                                                DATE: 08/26/24 Eval and HEP Self Care: Additional minutes spent for educating on updated Therapeutic Home Exercise Program as well as comparing current status to condition at start of symptoms. This included exercises focusing on stretching, strengthening, with focus on eccentric aspects. Long term goals include an improvement in range of motion, strength, endurance as well as avoiding reinjury. Patient's frequency would include in 1-2 times a day, 3-5 times a week for a duration of 6-12 weeks. Proper technique shown and discussed handout in great detail. All questions were discussed and addressed.      PATIENT EDUCATION:  Education details: Discussed eval findings, rehab rationale and POC and patient is in agreement  Person educated: Patient Education method: Explanation and Handouts Education comprehension: verbalized understanding and needs further education  HOME EXERCISE PROGRAM: Access Code: [email or web address] URL: [email or web address] Date: 08/26/2024 Prepared by: Reyes Kohut  Exercises - Supine bridge with ball  - 2 x daily - 3-5 x weekly - 1-2 sets - 15 reps - Modified Thomas Stretch  - 2 x daily - 3-5 x weekly - 1 sets - 2 reps - 30s hold - Sit to Stand with Arms Crossed  - 2 x daily - 3-5 x weekly - 2 sets - 5 reps  ASSESSMENT:  CLINICAL IMPRESSION: Continued with core stability, hip strengthening, and stretches on this date. Pt tolerable to all exercises. Notes feeling loose by end of session.  Patient continues to benefit from skilled PT services and should be progressed as able to improve functional independence.   EVAL: Patient is a 49 y.o. female who was seen today for physical therapy evaluation and treatment for chronic low back pain.  Patient presents with ROM restriction in lumbar region, primarily extenison and rotation, neural tension signs negative, BLE weakness evident in 30s chair stand test and soft tissue restrictions in L hip flexors most likely contributing to low back symptoms.  Patient is a good rehab candidate with treatment focused on increasing core and LE strength and regaining trunk mobility.  OBJECTIVE IMPAIRMENTS: decreased activity tolerance, decreased knowledge of condition, decreased mobility, decreased ROM, decreased strength, increased fascial restrictions, impaired perceived functional ability, improper body mechanics, and pain.   ACTIVITY LIMITATIONS: carrying, lifting, standing, and sleeping  PERSONAL FACTORS: Age, Behavior pattern, Fitness, Past/current experiences, and Time since onset of injury/illness/exacerbation are  also affecting patient's functional outcome.   REHAB POTENTIAL: Good  CLINICAL DECISION MAKING: Stable/uncomplicated  EVALUATION COMPLEXITY: Moderate   GOALS: Goals reviewed with patient? No  SHORT TERM GOALS: Target date: 09/23/2024    Patient to demonstrate independence in HEP  Baseline: [id number] Goal status: INITIAL  2.  Resolve PKB limitations Baseline: mild restrictions L>R Goal status: INITIAL   LONG TERM GOALS: Target date: 10/21/2024    Patient will acknowledge 6/10 pain at least once during episode of care   Baseline: 8/10 Goal status: INITIAL  2.  Patient will score at least 8/50 on ODI to signify clinically meaningful improvement in functional abilities.   Baseline: 12/50 Goal status: INITIAL  3.  Patient will increase 30s chair stand reps from 5 to 8 with/without arms to demonstrate and improved functional ability with less pain/difficulty as well as reduce fall risk.  Baseline:  5 Goal status: INITIAL  4.  Increase trunk ROM to 75% Baseline:  AROM eval  Flexion 90%  Extension 25%p!  Right lateral flexion 90%  Left lateral flexion 90%  Right rotation 50%  Left rotation 50%   Goal status: INITIAL  PLAN:  PT FREQUENCY: 1-2x/week  PT DURATION: 6 weeks  PLANNED INTERVENTIONS: 97110-Therapeutic exercises, 97530- Therapeutic activity, [id number]- Neuromuscular re-education, 97535- Self Care, [id number]- Manual therapy, and Patient/Family education.  PLAN FOR NEXT SESSION: HEP review and update, manual techniques as appropriate, aerobic tasks, ROM and flexibility activities, strengthening and PREs, TPDN, gait and balance training,aquatic therapy, modalities for pain and NMRE     For all possible CPT codes, reference the Planned Interventions line above.     Check all conditions that are expected to impact treatment: {Conditions expected to impact treatment:None of these apply   If treatment provided at initial evaluation, no treatment charged due to lack of  authorization.       Marijo DELENA Berber, PT 09/10/2024, 9:05 AM   "

## 2024-09-15 ENCOUNTER — Ambulatory Visit: Payer: MEDICAID

## 2024-09-15 DIAGNOSIS — M6281 Muscle weakness (generalized): Secondary | ICD-10-CM

## 2024-09-15 DIAGNOSIS — M5459 Other low back pain: Secondary | ICD-10-CM

## 2024-09-17 ENCOUNTER — Ambulatory Visit: Payer: MEDICAID

## 2024-09-22 ENCOUNTER — Ambulatory Visit: Payer: MEDICAID

## 2024-09-24 ENCOUNTER — Ambulatory Visit: Payer: MEDICAID

## 2024-10-26 ENCOUNTER — Ambulatory Visit: Payer: MEDICAID | Admitting: Family Medicine
